# Patient Record
Sex: Male | Born: 1950 | Race: White | Hispanic: No | Marital: Single | Smoking: Never smoker
Health system: Southern US, Community
[De-identification: ages and names within clinical notes are randomized; demographics above are authoritative.]

## PROBLEM LIST (undated history)

## (undated) DIAGNOSIS — E119 Type 2 diabetes mellitus without complications: Secondary | ICD-10-CM

## (undated) DIAGNOSIS — I639 Cerebral infarction, unspecified: Secondary | ICD-10-CM

## (undated) DIAGNOSIS — E876 Hypokalemia: Secondary | ICD-10-CM

## (undated) DIAGNOSIS — K219 Gastro-esophageal reflux disease without esophagitis: Secondary | ICD-10-CM

## (undated) DIAGNOSIS — I4891 Unspecified atrial fibrillation: Secondary | ICD-10-CM

## (undated) DIAGNOSIS — H524 Presbyopia: Secondary | ICD-10-CM

## (undated) DIAGNOSIS — R972 Elevated prostate specific antigen [PSA]: Secondary | ICD-10-CM

## (undated) DIAGNOSIS — M8620 Subacute osteomyelitis, unspecified site: Secondary | ICD-10-CM

## (undated) DIAGNOSIS — I519 Heart disease, unspecified: Secondary | ICD-10-CM

## (undated) DIAGNOSIS — I10 Essential (primary) hypertension: Secondary | ICD-10-CM

## (undated) DIAGNOSIS — E785 Hyperlipidemia, unspecified: Secondary | ICD-10-CM

## (undated) DIAGNOSIS — K209 Esophagitis, unspecified without bleeding: Secondary | ICD-10-CM

## (undated) DIAGNOSIS — I219 Acute myocardial infarction, unspecified: Secondary | ICD-10-CM

## (undated) DIAGNOSIS — I739 Peripheral vascular disease, unspecified: Secondary | ICD-10-CM

## (undated) DIAGNOSIS — I2510 Atherosclerotic heart disease of native coronary artery without angina pectoris: Secondary | ICD-10-CM

## (undated) DIAGNOSIS — G3184 Mild cognitive impairment, so stated: Secondary | ICD-10-CM

## (undated) DIAGNOSIS — B351 Tinea unguium: Secondary | ICD-10-CM

## (undated) DIAGNOSIS — N182 Chronic kidney disease, stage 2 (mild): Secondary | ICD-10-CM

## (undated) DIAGNOSIS — E538 Deficiency of other specified B group vitamins: Secondary | ICD-10-CM

## (undated) DIAGNOSIS — R413 Other amnesia: Secondary | ICD-10-CM

## (undated) DIAGNOSIS — N189 Chronic kidney disease, unspecified: Secondary | ICD-10-CM

## (undated) DIAGNOSIS — M1990 Unspecified osteoarthritis, unspecified site: Secondary | ICD-10-CM

## (undated) DIAGNOSIS — T7840XA Allergy, unspecified, initial encounter: Secondary | ICD-10-CM

## (undated) DIAGNOSIS — G2581 Restless legs syndrome: Secondary | ICD-10-CM

## (undated) HISTORY — DX: Presbyopia: H52.4

## (undated) HISTORY — DX: Unspecified atrial fibrillation: I48.91

## (undated) HISTORY — PX: CORONARY ARTERY BYPASS GRAFT: SHX141

## (undated) HISTORY — DX: Esophagitis, unspecified without bleeding: K20.90

## (undated) HISTORY — DX: Essential (primary) hypertension: I10

## (undated) HISTORY — DX: Peripheral vascular disease, unspecified: I73.9

## (undated) HISTORY — DX: Tinea unguium: B35.1

## (undated) HISTORY — DX: Other amnesia: R41.3

## (undated) HISTORY — DX: Chronic kidney disease, stage 2 (mild): N18.2

## (undated) HISTORY — DX: Subacute osteomyelitis, unspecified site: M86.20

## (undated) HISTORY — DX: Restless legs syndrome: G25.81

## (undated) HISTORY — DX: Hypokalemia: E87.6

## (undated) HISTORY — PX: CARDIAC PACEMAKER PLACEMENT: SHX583

## (undated) HISTORY — DX: Allergy, unspecified, initial encounter: T78.40XA

## (undated) HISTORY — DX: Type 2 diabetes mellitus without complications: E11.9

## (undated) HISTORY — PX: FOOT AMPUTATION: SHX951

## (undated) HISTORY — DX: Hyperlipidemia, unspecified: E78.5

## (undated) HISTORY — DX: Deficiency of other specified B group vitamins: E53.8

## (undated) HISTORY — DX: Unspecified osteoarthritis, unspecified site: M19.90

## (undated) HISTORY — DX: Chronic kidney disease, unspecified: N18.9

## (undated) HISTORY — DX: Acute myocardial infarction, unspecified: I21.9

## (undated) HISTORY — DX: Cerebral infarction, unspecified: I63.9

## (undated) HISTORY — DX: Esophagitis, unspecified: K20.9

## (undated) HISTORY — DX: Elevated prostate specific antigen (PSA): R97.20

## (undated) HISTORY — DX: Heart disease, unspecified: I51.9

## (undated) HISTORY — DX: Atherosclerotic heart disease of native coronary artery without angina pectoris: I25.10

## (undated) HISTORY — DX: Mild cognitive impairment of uncertain or unknown etiology: G31.84

## (undated) HISTORY — DX: Gastro-esophageal reflux disease without esophagitis: K21.9

---

## 1998-02-09 ENCOUNTER — Other Ambulatory Visit: Admission: RE | Admit: 1998-02-09 | Discharge: 1998-02-09 | Payer: Self-pay

## 2000-02-20 ENCOUNTER — Encounter: Payer: Self-pay | Admitting: Interventional Cardiology

## 2000-02-20 ENCOUNTER — Observation Stay (HOSPITAL_COMMUNITY): Admission: EM | Admit: 2000-02-20 | Discharge: 2000-02-21 | Payer: Self-pay | Admitting: Emergency Medicine

## 2001-11-10 ENCOUNTER — Ambulatory Visit (HOSPITAL_BASED_OUTPATIENT_CLINIC_OR_DEPARTMENT_OTHER): Admission: RE | Admit: 2001-11-10 | Discharge: 2001-11-10 | Payer: Self-pay | Admitting: Orthopedic Surgery

## 2002-11-03 ENCOUNTER — Encounter: Payer: Self-pay | Admitting: Emergency Medicine

## 2002-11-04 ENCOUNTER — Inpatient Hospital Stay (HOSPITAL_COMMUNITY): Admission: EM | Admit: 2002-11-04 | Discharge: 2002-11-09 | Payer: Self-pay | Admitting: Psychiatry

## 2004-06-21 ENCOUNTER — Ambulatory Visit (HOSPITAL_COMMUNITY): Admission: RE | Admit: 2004-06-21 | Discharge: 2004-06-21 | Payer: Self-pay | Admitting: *Deleted

## 2004-06-27 ENCOUNTER — Encounter: Admission: RE | Admit: 2004-06-27 | Discharge: 2004-06-27 | Payer: Self-pay | Admitting: *Deleted

## 2010-06-24 ENCOUNTER — Encounter: Payer: Self-pay | Admitting: Gastroenterology

## 2018-02-24 ENCOUNTER — Other Ambulatory Visit: Payer: Self-pay

## 2018-02-24 ENCOUNTER — Encounter: Payer: Self-pay | Admitting: Nurse Practitioner

## 2018-02-24 ENCOUNTER — Ambulatory Visit (INDEPENDENT_AMBULATORY_CARE_PROVIDER_SITE_OTHER): Payer: Self-pay | Admitting: Nurse Practitioner

## 2018-02-24 VITALS — BP 110/66 | HR 79 | Temp 99.2°F | Ht 70.0 in | Wt 167.0 lb

## 2018-02-24 DIAGNOSIS — I5032 Chronic diastolic (congestive) heart failure: Secondary | ICD-10-CM

## 2018-02-24 DIAGNOSIS — E1165 Type 2 diabetes mellitus with hyperglycemia: Secondary | ICD-10-CM

## 2018-02-24 DIAGNOSIS — IMO0002 Reserved for concepts with insufficient information to code with codable children: Secondary | ICD-10-CM

## 2018-02-24 DIAGNOSIS — Z7901 Long term (current) use of anticoagulants: Secondary | ICD-10-CM

## 2018-02-24 DIAGNOSIS — S98311A Complete traumatic amputation of right midfoot, initial encounter: Secondary | ICD-10-CM

## 2018-02-24 DIAGNOSIS — Z7689 Persons encountering health services in other specified circumstances: Secondary | ICD-10-CM

## 2018-02-24 DIAGNOSIS — M86271 Subacute osteomyelitis, right ankle and foot: Secondary | ICD-10-CM

## 2018-02-24 DIAGNOSIS — I482 Chronic atrial fibrillation, unspecified: Secondary | ICD-10-CM

## 2018-02-24 DIAGNOSIS — E118 Type 2 diabetes mellitus with unspecified complications: Secondary | ICD-10-CM

## 2018-02-24 MED ORDER — CEPHALEXIN 500 MG PO CAPS: 500.0000 mg | ORAL_CAPSULE | Freq: Three times a day (TID) | ORAL | 0 refills | Status: AC

## 2018-02-24 MED ORDER — METFORMIN HCL 500 MG PO TABS: ORAL_TABLET | ORAL | 3 refills | Status: DC

## 2018-02-24 NOTE — Patient Instructions (Addendum)
Jason Montes,   Thank you for coming in to clinic today.  1. [facility] [address] [place], [place] [address] [phone]  2. Reduce Lantus to 15 units daily  3. STOP humalog  4. START metformin 500 mg tabs.   Week 1: Take 1 tab in am Week 2: Take 1 tab in am and pm Week 3: Take 2 tabs in am and 1 in pm Week 4: Take 2 tabs in am and 2 tabs in pm Slow this increase if having abdominal cramping or diarrhea.  5. Labs today  Please schedule a follow-up appointment with Cassell Smiles, AGNP. Return in about 6 weeks (around 04/07/2018) for diabetes.  If you have any other questions or concerns, please feel free to call the clinic or send a message through [employer]. You may also schedule an earlier appointment if necessary.  You will receive a survey after today's visit either digitally by e-mail or paper by [employer]. Your experiences and feedback matter to [place].  Please respond so we know how we are doing as we provide care for you.   Cassell Smiles, DNP, AGNP-BC Adult Gerontology Nurse Practitioner [place]

## 2018-02-24 NOTE — Progress Notes (Signed)
Subjective:    Patient ID: Jason Montes, male    DOB: 1951-03-03, 67 y.o.   MRN: [id number]  Jason Montes is a 67 y.o. male presenting on 02/24/2018 for Establish Care (medication refills diabetes, history strokes , heart attack )   HPI Establish Care New Provider Pt last seen by PCP several months ago when patient was incarcerated as his medical care was provided by the [facility]  Patient was released from prison approx 7 days ago. - Patient's daughter is Brendolyn Patty and her signifcbeant other, Jenna Luo.    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  Present medical care planned for patient to be continued as inpatient in hospital for IV antibiotic use.  This was never completed as there was not a bed available at [facility].  Patient has not been on any antibiotics since released from prison 7 days ago.  Chronic atrial fibrillation, chronic anticoagulation Patient is also on Warfarin with unknown last INR check.  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  He had a heart attack in 2017.  Otherwise, patient has no signs or symptoms of uncontrolled atrial fibrillation to include shortness of breath, heart palpitations, heart racing.  Uncontrolled type 2 diabetes - Complicated by peripheral vascular disease, amputation of partial right foot, hyperlipidemia DM T2, poor circulation - Progression over several months started with hangnail on toe, removed hangnail, amputated toe, all toes, then midfoot forward.  Has continued having poor healing and infections of remaining foot.  Working to get MRI at [facility].  No IV abx, discharged 02/11/2018.   No abx since discharge - wanted to have IV abx continued for osteomyelitis.  No known pathogen.  Closin little by little over last 6 months.  Now nearly closed, little drainage, some odor. -Diabetes has been managed by  taking Lantus 32 units in am and hemoglobin are 2 to 8 units 4 times daily.  Since being home, patient has been having overnight lows and is only using one daily dose of Humalog.  Patient and his daughter mention that they have been "chasing blood sugars" with significant lows followed by significant highs.  Ate 2 donuts this am.  Low was less than 50, and 1 hr after 2 donuts, CBG near 100. - Today CBG: avg about 100-109 with an extra freestyle CGM from a family member.  Previously averages have been similar. - Patient is eating much more consistently at home with better control over food choices (less refined carbs, more protein), is gaining weight which was needed.  Heart failure Patient with implanted AICD.  Possibly also a pacemaker but is unknown.  Patient currently denies any significant shortness of breath on exertion or at rest.  Requests cardiology referral for heart failure and A. Fib.  Past Medical History:  Diagnosis Date  . Allergy   . Atrial fibrillation ([place])   . B12 deficiency   . Chronic kidney disease   . Chronic kidney disease (CKD), stage II (mild)   . Coronary atherosclerosis   . Diabetes mellitus without complication ([place])   . Elevated PSA   . Esophagitis   . GERD (gastroesophageal reflux disease)   . Heart attack ([place])   . Heart disease   . Hyperlipidemia   . Hypertension   . Hypokalemia   . Mild neurocognitive disorder   . Osteoarthritis   . Presbyopia   . PVD (  peripheral vascular disease) ([place])   . Restless leg syndrome   . Stroke (cerebrum) ([place])   . Stroke ([place])   . Subacute osteomyelitis ([place])   . Tinea unguium   . Uncompensated short term memory deficit    Past Surgical History:  Procedure Laterality Date  . CARDIAC PACEMAKER PLACEMENT    . CORONARY ARTERY BYPASS GRAFT    . FOOT AMPUTATION Right    Social History   Socioeconomic History  . Marital status: Single    Spouse name: Not on file  . Number of children: Not on file  . Years of  education: Not on file  . Highest education level: Not on file  Occupational History  . Not on file  Social Needs  . Financial resource strain: Not on file  . Food insecurity:    Worry: Not on file    Inability: Not on file  . Transportation needs:    Medical: Not on file    Non-medical: Not on file  Tobacco Use  . Smoking status: Never Smoker  . Smokeless tobacco: Never Used  Substance and Sexual Activity  . Alcohol use: Never    Frequency: Never  . Drug use: Never  . Sexual activity: Not on file  Lifestyle  . Physical activity:    Days per week: Not on file    Minutes per session: Not on file  . Stress: Not on file  Relationships  . Social connections:    Talks on phone: Not on file    Gets together: Not on file    Attends religious service: Not on file    Active member of club or organization: Not on file    Attends meetings of clubs or organizations: Not on file    Relationship status: Not on file  . Intimate partner violence:    Fear of current or ex partner: Not on file    Emotionally abused: Not on file    Physically abused: Not on file    Forced sexual activity: Not on file  Other Topics Concern  . Not on file  Social History Narrative  . Not on file   Family History  Problem Relation Age of Onset  . Heart failure Mother   . Colon cancer Mother   . Diabetes Mother   . Thyroid disease Sister    Current Outpatient Medications on File Prior to Visit  Medication Sig  . atorvastatin (LIPITOR) 40 MG tablet Take 40 mg by mouth daily.  . bethanechol (URECHOLINE) 10 MG tablet Take 20 mg by mouth 2 (two) times daily.   . carvedilol (COREG) 3.125 MG tablet Take 3.125 mg by mouth 2 (two) times daily with a meal.  . cetirizine (ZYRTEC) 10 MG tablet Take 10 mg by mouth daily.  . Cholecalciferol (VITAMIN D3) 1000 units CAPS Take 1,000 Units by mouth daily.   . clotrimazole-betamethasone (LOTRISONE) cream Apply 1 application topically 2 (two) times daily.  .  cyanocobalamin (,VITAMIN B-12,) 1000 MCG/ML injection Inject 1,000 mcg into the muscle every 30 (thirty) days.  . Dextrose, Diabetic Use, (DEXTROSE PO) Take 8 mg by mouth as needed (hypoglycemia).  Marland Kitchen docusate sodium (COLACE) 100 MG capsule Take 100 mg by mouth 2 (two) times daily.  . famotidine (PEPCID) 20 MG tablet Take 20 mg by mouth 2 (two) times daily.  . fluticasone (FLONASE) 50 MCG/ACT nasal spray Place 1 spray into both nostrils daily.   Marland Kitchen gabapentin (NEURONTIN) 600 MG tablet Take 600 mg by mouth 3 (  three) times daily.   . isosorbide mononitrate (IMDUR) 30 MG 24 hr tablet Take 30 mg by mouth daily.  . magnesium oxide (MAG-OX) 400 MG tablet Take 400 mg by mouth daily.  . nitroGLYCERIN (NITROSTAT) 0.4 MG SL tablet Place 0.4 mg under the tongue every 5 (five) minutes as needed for chest pain.  Marland Kitchen senna (SENOKOT) 8.6 MG tablet Take 1 tablet by mouth daily.  Marland Kitchen spironolactone (ALDACTONE) 25 MG tablet Take 25 mg by mouth daily.  . tamsulosin (FLOMAX) 0.4 MG CAPS capsule Take 0.4 mg by mouth daily.   Marland Kitchen torsemide (DEMADEX) 20 MG tablet Take 20 mg by mouth 2 (two) times daily.   No current facility-administered medications on file prior to visit.     Review of Systems  Constitutional: Positive for fatigue. Negative for activity change, appetite change and unexpected weight change.  HENT: Negative for congestion, hearing loss and trouble swallowing.   Eyes: Negative for visual disturbance.  Respiratory: Negative for choking, shortness of breath and wheezing.   Cardiovascular: Negative for chest pain and palpitations.  Gastrointestinal: Negative for abdominal pain, blood in stool, constipation and diarrhea.  Genitourinary: Negative for difficulty urinating, discharge, flank pain, genital sores, penile pain, penile swelling, scrotal swelling and testicular pain.  Musculoskeletal: Positive for back pain and gait problem (2/2 partial foot amputation). Negative for arthralgias and myalgias.  Skin:  Positive for wound. Negative for color change and rash.  Allergic/Immunologic: Negative for environmental allergies.  Neurological: Positive for weakness. Negative for dizziness, seizures and headaches.  Psychiatric/Behavioral: Positive for confusion (memory impairment), decreased concentration and dysphoric mood. Negative for behavioral problems, sleep disturbance and suicidal ideas. The patient is nervous/anxious.    Per HPI unless specifically indicated above     Objective:    BP 110/66 (BP Location: Right Arm, Patient Position: Sitting, Cuff Size: Normal)   Pulse 79   Temp 99.2 F (37.3 C) (Oral)   Ht 5\' 10"  (1.778 m)   Wt 167 lb (75.8 kg)   BMI 23.96 kg/m   Wt Readings from Last 3 Encounters:  02/24/18 167 lb (75.8 kg)    Physical Exam  Constitutional: He is oriented to person, place, and time. He appears well-developed and well-nourished. No distress.  HENT:  Head: Normocephalic and atraumatic.  Cardiovascular: Normal rate, S1 normal, S2 normal, normal heart sounds and intact distal pulses.  Pulmonary/Chest: Effort normal and breath sounds normal. No respiratory distress.  Neurological: He is alert and oriented to person, place, and time.  Skin: Skin is warm and dry.  Psychiatric: He has a normal mood and affect. His behavior is normal.  Vitals reviewed.      Assessment & Plan:   Problem List Items Addressed This Visit      Cardiovascular and Mediastinum   Atrial fibrillation ([place]) Status unknown for monitoring anticoag.  Recheck labs.  Continue meds without changes today.  Refills provided. Followup after labs.    Relevant Medications   atorvastatin (LIPITOR) 40 MG tablet   isosorbide mononitrate (IMDUR) 30 MG 24 hr tablet   spironolactone (ALDACTONE) 25 MG tablet   carvedilol (COREG) 3.125 MG tablet   torsemide (DEMADEX) 20 MG tablet   nitroGLYCERIN (NITROSTAT) 0.4 MG SL tablet   warfarin (COUMADIN) 1 MG tablet   warfarin (COUMADIN) 7.5 MG tablet   Other  Relevant Orders   Ambulatory referral to Cardiology   Chronic diastolic heart failure ([place]) Stable today on exam.  Medications tolerated without side effects.  Continue at current doses.  Refills provided.  Check labs today. Followup 6 weeks.    Relevant Medications   atorvastatin (LIPITOR) 40 MG tablet   isosorbide mononitrate (IMDUR) 30 MG 24 hr tablet   spironolactone (ALDACTONE) 25 MG tablet   carvedilol (COREG) 3.125 MG tablet   torsemide (DEMADEX) 20 MG tablet   nitroGLYCERIN (NITROSTAT) 0.4 MG SL tablet   warfarin (COUMADIN) 1 MG tablet   warfarin (COUMADIN) 7.5 MG tablet   Other Relevant Orders   Ambulatory referral to Cardiology   Magnesium     Endocrine   DM (diabetes mellitus), type 2, uncontrolled with complications ([place]) UncontrolledDM with goal A1c < 8.0%. - Complications - foot ulcer w amputation, peripheral neuropathy and hypoglycemia.  Plan:  1. Change therapy:  - Start metformin with gradual dose increase to 1,000 mg bid See Avs - Reduce lantus 15 units daily - Stop humalog 2. Encourage improved lifestyle: - low carb/low glycemic diet handout provided - Increase physical activity to 30 minutes most days of the week.  Explained that increased physical activity increases body's use of sugar for energy. 3. Check fasting am CBG and bring log to next visit for review 4. Continue ASA, ACEi and Statin 5. Advised to schedule DM ophtho exam, send record. 6. Follow-up 6 weeks for DM with sugar log   Relevant Medications   atorvastatin (LIPITOR) 40 MG tablet   metFORMIN (GLUCOPHAGE) 500 MG tablet   insulin glargine (LANTUS) 100 UNIT/ML injection   Dextrose, Diabetic Use, (DEXTROSE PO)   Other Relevant Orders   Hemoglobin A1c   COMPLETE METABOLIC PANEL WITH GFR     Musculoskeletal and Integument   Subacute osteomyelitis of right foot ([place]) Continue treatment.  Consider referral to wound care if worsening. Family are EMTs and will call if worsening skin infection.    Relevant Medications   clotrimazole-betamethasone (LOTRISONE) cream     Other   Chronic anticoagulation - Primary   Relevant Medications   warfarin (COUMADIN) 1 MG tablet   warfarin (COUMADIN) 7.5 MG tablet   Other Relevant Orders   INR/PT   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 [facility])   Relevant Orders   AMB referral to wound care center    Other Visit Diagnoses    Encounter to establish care     Previous PCP [facility] at [facility].  Records will be requested.  Past medical, family, and surgical history reviewed w/ pt.        Meds ordered this encounter  Medications  . metFORMIN (GLUCOPHAGE) 500 MG tablet    Sig: Start 1 tab by mouth in am daily for 7 days. Then increase to 1 tab in am & pm for 7 days.  Continue weekly increase to 2 tabs twice daily.    Dispense:  120 tablet    Refill:  3    Order Specific Question:   Supervising Provider    Answer:   Olin Hauser [[id number]]  . cephALEXin (KEFLEX) 500 MG capsule    Sig: Take 1 capsule (500 mg total) by mouth 3 (three) times daily for 14 days.    Dispense:  42 capsule    Refill:  0    Order Specific Question:   Supervising Provider    Answer:   Olin Hauser [[id number]]  . insulin glargine (LANTUS) 100 UNIT/ML injection    Sig: Inject 0.15 mLs (15 Units total) into the skin daily.    Dispense:  10 mL    Refill:  1    Fill only when refill  is requested by patient.    Order Specific Question:   Supervising Provider    Answer:   Olin Hauser [[id number]]  . warfarin (COUMADIN) 1 MG tablet    Sig: Take 1 tablet (1 mg total) by mouth at bedtime.    Dispense:  30 tablet    Refill:  1    Order Specific Question:   Supervising Provider    Answer:   Olin Hauser [[id number]]  . warfarin (COUMADIN) 7.5 MG tablet    Sig: Take 1 tablet (7.5 mg total) by mouth at bedtime.    Dispense:  30 tablet    Refill:  1    Order Specific Question:   Supervising Provider    Answer:   Olin Hauser [[id number]]     Follow up  plan: Return in about 6 weeks (around 04/07/2018) for diabetes.  Cassell Smiles, DNP, AGPCNP-BC Adult Gerontology Primary Care Nurse Practitioner [facility] 02/24/2018, 2:42 PM

## 2018-02-25 DIAGNOSIS — D519 Vitamin B12 deficiency anemia, unspecified: Secondary | ICD-10-CM | POA: Insufficient documentation

## 2018-02-25 DIAGNOSIS — M86271 Subacute osteomyelitis, right ankle and foot: Secondary | ICD-10-CM | POA: Insufficient documentation

## 2018-02-25 DIAGNOSIS — S98319A Complete traumatic amputation of unspecified midfoot, initial encounter: Secondary | ICD-10-CM | POA: Insufficient documentation

## 2018-02-25 DIAGNOSIS — I739 Peripheral vascular disease, unspecified: Secondary | ICD-10-CM | POA: Insufficient documentation

## 2018-02-25 DIAGNOSIS — E1165 Type 2 diabetes mellitus with hyperglycemia: Secondary | ICD-10-CM | POA: Insufficient documentation

## 2018-02-25 DIAGNOSIS — E785 Hyperlipidemia, unspecified: Secondary | ICD-10-CM | POA: Insufficient documentation

## 2018-02-25 DIAGNOSIS — N182 Chronic kidney disease, stage 2 (mild): Secondary | ICD-10-CM | POA: Insufficient documentation

## 2018-02-25 DIAGNOSIS — M6281 Muscle weakness (generalized): Secondary | ICD-10-CM | POA: Insufficient documentation

## 2018-02-25 DIAGNOSIS — Z7901 Long term (current) use of anticoagulants: Secondary | ICD-10-CM | POA: Insufficient documentation

## 2018-02-25 DIAGNOSIS — B351 Tinea unguium: Secondary | ICD-10-CM | POA: Insufficient documentation

## 2018-02-25 DIAGNOSIS — K2100 Gastro-esophageal reflux disease with esophagitis, without bleeding: Secondary | ICD-10-CM | POA: Insufficient documentation

## 2018-02-25 DIAGNOSIS — G2581 Restless legs syndrome: Secondary | ICD-10-CM | POA: Insufficient documentation

## 2018-02-25 DIAGNOSIS — I10 Essential (primary) hypertension: Secondary | ICD-10-CM | POA: Insufficient documentation

## 2018-02-25 DIAGNOSIS — I5032 Chronic diastolic (congestive) heart failure: Secondary | ICD-10-CM | POA: Insufficient documentation

## 2018-02-25 DIAGNOSIS — G3184 Mild cognitive impairment, so stated: Secondary | ICD-10-CM | POA: Insufficient documentation

## 2018-02-25 DIAGNOSIS — H524 Presbyopia: Secondary | ICD-10-CM | POA: Insufficient documentation

## 2018-02-25 DIAGNOSIS — I5022 Chronic systolic (congestive) heart failure: Secondary | ICD-10-CM | POA: Insufficient documentation

## 2018-02-25 DIAGNOSIS — M1990 Unspecified osteoarthritis, unspecified site: Secondary | ICD-10-CM | POA: Insufficient documentation

## 2018-02-25 DIAGNOSIS — E876 Hypokalemia: Secondary | ICD-10-CM | POA: Insufficient documentation

## 2018-02-25 DIAGNOSIS — I6930 Unspecified sequelae of cerebral infarction: Secondary | ICD-10-CM | POA: Insufficient documentation

## 2018-02-25 DIAGNOSIS — I4891 Unspecified atrial fibrillation: Secondary | ICD-10-CM | POA: Insufficient documentation

## 2018-02-25 DIAGNOSIS — I2510 Atherosclerotic heart disease of native coronary artery without angina pectoris: Secondary | ICD-10-CM | POA: Insufficient documentation

## 2018-02-25 DIAGNOSIS — IMO0002 Reserved for concepts with insufficient information to code with codable children: Secondary | ICD-10-CM | POA: Insufficient documentation

## 2018-02-25 DIAGNOSIS — E118 Type 2 diabetes mellitus with unspecified complications: Secondary | ICD-10-CM

## 2018-02-25 MED ORDER — INSULIN GLARGINE 100 UNIT/ML ~~LOC~~ SOLN: 15.0000 [IU] | Freq: Every day | SUBCUTANEOUS | 1 refills | Status: DC

## 2018-02-25 MED ORDER — WARFARIN SODIUM 7.5 MG PO TABS: 7.5000 mg | ORAL_TABLET | Freq: Every day | ORAL | 1 refills | Status: DC

## 2018-02-25 MED ORDER — WARFARIN SODIUM 1 MG PO TABS: 1.0000 mg | ORAL_TABLET | Freq: Every day | ORAL | 1 refills | Status: DC

## 2018-03-22 ENCOUNTER — Ambulatory Visit: Payer: Self-pay | Admitting: Physician Assistant

## 2018-04-01 ENCOUNTER — Other Ambulatory Visit: Payer: Self-pay

## 2018-04-01 DIAGNOSIS — Z1211 Encounter for screening for malignant neoplasm of colon: Secondary | ICD-10-CM

## 2018-04-06 ENCOUNTER — Ambulatory Visit: Payer: Self-pay | Admitting: Nurse Practitioner

## 2018-04-07 ENCOUNTER — Ambulatory Visit: Payer: Self-pay | Admitting: Internal Medicine

## 2018-04-08 ENCOUNTER — Ambulatory Visit: Payer: Self-pay | Admitting: Nurse Practitioner

## 2018-04-23 ENCOUNTER — Encounter: Payer: Self-pay | Admitting: Nurse Practitioner

## 2018-04-26 ENCOUNTER — Encounter: Payer: Self-pay | Admitting: *Deleted

## 2018-04-26 ENCOUNTER — Other Ambulatory Visit: Payer: Self-pay

## 2018-04-26 ENCOUNTER — Telehealth: Payer: Self-pay

## 2018-04-26 NOTE — Telephone Encounter (Signed)
Patients daughter contacted office states her father did not receive colonoscopy instructions for tomorrow.  She said he has since moved from the address on file.  Procedure has been rescheduled from tomorrow to December 17th Dr. Allen Norris at [facility].  Referral updated.  Address updated.  Trish notified.  New instructions being mailed.  No Cancellation Fee  Thanks Sharyn Lull

## 2018-05-14 ENCOUNTER — Other Ambulatory Visit: Payer: Self-pay

## 2018-05-17 ENCOUNTER — Encounter: Payer: Self-pay | Admitting: *Deleted

## 2018-05-18 ENCOUNTER — Ambulatory Visit
Admission: RE | Admit: 2018-05-18 | Discharge: 2018-05-18 | Disposition: A | Discharge status: Home / Self Care | Payer: No Typology Code available for payment source | Source: Ambulatory Visit | Attending: Gastroenterology | Admitting: Gastroenterology

## 2018-05-18 ENCOUNTER — Ambulatory Visit: Payer: No Typology Code available for payment source | Admitting: Certified Registered"

## 2018-05-18 ENCOUNTER — Encounter
Admission: RE | Disposition: A | Discharge status: Home / Self Care | Payer: Self-pay | Source: Ambulatory Visit | Attending: Gastroenterology

## 2018-05-18 DIAGNOSIS — Z950 Presence of cardiac pacemaker: Secondary | ICD-10-CM | POA: Insufficient documentation

## 2018-05-18 DIAGNOSIS — D122 Benign neoplasm of ascending colon: Secondary | ICD-10-CM

## 2018-05-18 DIAGNOSIS — Z951 Presence of aortocoronary bypass graft: Secondary | ICD-10-CM | POA: Diagnosis not present

## 2018-05-18 DIAGNOSIS — I129 Hypertensive chronic kidney disease with stage 1 through stage 4 chronic kidney disease, or unspecified chronic kidney disease: Secondary | ICD-10-CM | POA: Insufficient documentation

## 2018-05-18 DIAGNOSIS — K635 Polyp of colon: Secondary | ICD-10-CM | POA: Diagnosis not present

## 2018-05-18 DIAGNOSIS — D125 Benign neoplasm of sigmoid colon: Secondary | ICD-10-CM

## 2018-05-18 DIAGNOSIS — Z79899 Other long term (current) drug therapy: Secondary | ICD-10-CM | POA: Insufficient documentation

## 2018-05-18 DIAGNOSIS — E1151 Type 2 diabetes mellitus with diabetic peripheral angiopathy without gangrene: Secondary | ICD-10-CM | POA: Diagnosis not present

## 2018-05-18 DIAGNOSIS — Z880 Allergy status to penicillin: Secondary | ICD-10-CM | POA: Diagnosis not present

## 2018-05-18 DIAGNOSIS — Z794 Long term (current) use of insulin: Secondary | ICD-10-CM | POA: Insufficient documentation

## 2018-05-18 DIAGNOSIS — D123 Benign neoplasm of transverse colon: Secondary | ICD-10-CM | POA: Diagnosis not present

## 2018-05-18 DIAGNOSIS — D120 Benign neoplasm of cecum: Secondary | ICD-10-CM | POA: Diagnosis not present

## 2018-05-18 DIAGNOSIS — K219 Gastro-esophageal reflux disease without esophagitis: Secondary | ICD-10-CM | POA: Insufficient documentation

## 2018-05-18 DIAGNOSIS — I2510 Atherosclerotic heart disease of native coronary artery without angina pectoris: Secondary | ICD-10-CM | POA: Diagnosis not present

## 2018-05-18 DIAGNOSIS — Z89431 Acquired absence of right foot: Secondary | ICD-10-CM | POA: Diagnosis not present

## 2018-05-18 DIAGNOSIS — Z800 Family history of malignant neoplasm of digestive organs: Secondary | ICD-10-CM | POA: Insufficient documentation

## 2018-05-18 DIAGNOSIS — K6389 Other specified diseases of intestine: Secondary | ICD-10-CM | POA: Insufficient documentation

## 2018-05-18 DIAGNOSIS — Z1211 Encounter for screening for malignant neoplasm of colon: Secondary | ICD-10-CM | POA: Diagnosis present

## 2018-05-18 DIAGNOSIS — N182 Chronic kidney disease, stage 2 (mild): Secondary | ICD-10-CM | POA: Diagnosis not present

## 2018-05-18 DIAGNOSIS — E785 Hyperlipidemia, unspecified: Secondary | ICD-10-CM | POA: Diagnosis not present

## 2018-05-18 DIAGNOSIS — I252 Old myocardial infarction: Secondary | ICD-10-CM | POA: Diagnosis not present

## 2018-05-18 DIAGNOSIS — D124 Benign neoplasm of descending colon: Secondary | ICD-10-CM | POA: Diagnosis not present

## 2018-05-18 DIAGNOSIS — Z888 Allergy status to other drugs, medicaments and biological substances status: Secondary | ICD-10-CM | POA: Diagnosis not present

## 2018-05-18 DIAGNOSIS — E1122 Type 2 diabetes mellitus with diabetic chronic kidney disease: Secondary | ICD-10-CM | POA: Insufficient documentation

## 2018-05-18 DIAGNOSIS — Z8673 Personal history of transient ischemic attack (TIA), and cerebral infarction without residual deficits: Secondary | ICD-10-CM | POA: Insufficient documentation

## 2018-05-18 HISTORY — PX: COLONOSCOPY WITH PROPOFOL: SHX5780

## 2018-05-18 LAB — GLUCOSE, CAPILLARY: Glucose-Capillary: 143 mg/dL — ABNORMAL HIGH (ref 70–99)

## 2018-05-18 SURGERY — COLONOSCOPY WITH PROPOFOL
Anesthesia: General

## 2018-05-18 MED ORDER — PHENYLEPHRINE HCL 10 MG/ML IJ SOLN
INTRAMUSCULAR | Status: AC
  Filled 2018-05-18: qty 1

## 2018-05-18 MED ORDER — LIDOCAINE HCL (CARDIAC) PF 100 MG/5ML IV SOSY
PREFILLED_SYRINGE | INTRAVENOUS | Status: DC | PRN
  Administered 2018-05-18: 50 mg via INTRAVENOUS

## 2018-05-18 MED ORDER — PROPOFOL 10 MG/ML IV BOLUS
INTRAVENOUS | Status: DC | PRN
  Administered 2018-05-18: 70 mg via INTRAVENOUS

## 2018-05-18 MED ORDER — LIDOCAINE HCL (PF) 2 % IJ SOLN
INTRAMUSCULAR | Status: AC
  Filled 2018-05-18: qty 10

## 2018-05-18 MED ORDER — PROPOFOL 500 MG/50ML IV EMUL
INTRAVENOUS | Status: AC
  Filled 2018-05-18: qty 50

## 2018-05-18 MED ORDER — SODIUM CHLORIDE 0.9 % IV SOLN
INTRAVENOUS | Status: DC
  Administered 2018-05-18: 1000 mL via INTRAVENOUS

## 2018-05-18 MED ORDER — PROPOFOL 500 MG/50ML IV EMUL
INTRAVENOUS | Status: DC | PRN
  Administered 2018-05-18: 120 ug/kg/min via INTRAVENOUS

## 2018-05-18 NOTE — Anesthesia Post-op Follow-up Note (Signed)
Anesthesia QCDR form completed.        

## 2018-05-18 NOTE — Anesthesia Procedure Notes (Signed)
Performed by: Caulin Begley, CRNA Pre-anesthesia Checklist: Patient identified, Emergency Drugs available, Suction available, Patient being monitored and Timeout performed Patient Re-evaluated:Patient Re-evaluated prior to induction Oxygen Delivery Method: Nasal cannula Induction Type: IV induction       

## 2018-05-18 NOTE — Anesthesia Preprocedure Evaluation (Signed)
Anesthesia Evaluation  Patient identified by MRN, date of birth, ID band Patient awake    Reviewed: Allergy & Precautions, H&P , NPO status , Patient's Chart, lab work & pertinent test results, reviewed documented beta blocker date and time   Airway Mallampati: II   Neck ROM: full    Dental  (+) Poor Dentition   Pulmonary neg pulmonary ROS,    Pulmonary exam normal        Cardiovascular Exercise Tolerance: Poor hypertension, On Medications + CAD, + Past MI and + Peripheral Vascular Disease  Normal cardiovascular exam Rhythm:regular Rate:Normal     Neuro/Psych CVA, No Residual Symptoms negative psych ROS   GI/Hepatic Neg liver ROS, GERD  Medicated,  Endo/Other  negative endocrine ROSdiabetes  Renal/GU Renal disease  negative genitourinary   Musculoskeletal   Abdominal   Peds  Hematology  (+) Blood dyscrasia, anemia ,   Anesthesia Other Findings Past Medical History: No date: Allergy No date: Atrial fibrillation (H[facility]) No date: B12 deficiency No date: Chronic kidney disease No date: Chronic kidney disease (CKD), stage II (mild) No date: Coronary atherosclerosis No date: Diabetes mellitus without complication ([facility]) No date: Elevated PSA No date: Esophagitis No date: GERD (gastroesophageal reflux disease) No date: Heart attack ([place]) No date: Heart disease No date: Hyperlipidemia No date: Hypertension No date: Hypokalemia No date: Mild neurocognitive disorder No date: Osteoarthritis No date: Presbyopia No date: PVD (peripheral vascular disease) ([place]) No date: Restless leg syndrome No date: Stroke (cerebrum) ([facility]) No date: Stroke ([place]) No date: Subacute osteomyelitis ([place]) No date: Tinea unguium No date: Uncompensated short term memory deficit Past Surgical History: No date: CARDIAC PACEMAKER PLACEMENT No date: CORONARY ARTERY BYPASS GRAFT No date: FOOT AMPUTATION; Right BMI    Body Mass Index:  25.99  kg/m     Reproductive/Obstetrics negative OB ROS                             Anesthesia Physical Anesthesia Plan  ASA: IV  Anesthesia Plan: General   Post-op Pain Management:    Induction:   PONV Risk Score and Plan:   Airway Management Planned:   Additional Equipment:   Intra-op Plan:   Post-operative Plan:   Informed Consent: I have reviewed the patients History and Physical, chart, labs and discussed the procedure including the risks, benefits and alternatives for the proposed anesthesia with the patient or authorized representative who has indicated his/her understanding and acceptance.   Dental Advisory Given  Plan Discussed with: CRNA  Anesthesia Plan Comments:         Anesthesia Quick Evaluation

## 2018-05-18 NOTE — H&P (Signed)
[name], MD [facility] [address] [place], [place] [address] [phone] Fax : [phone]  Primary Care Physician:  Administration, [facility] Primary Gastroenterologist:  Dr. Allen Norris  Pre-Procedure History & Physical: HPI:  Jason Montes is a 67 y.o. male is here for an colonoscopy.   Past Medical History:  Diagnosis Date  . Allergy   . Atrial fibrillation ([place])   . B12 deficiency   . Chronic kidney disease   . Chronic kidney disease (CKD), stage II (mild)   . Coronary atherosclerosis   . Diabetes mellitus without complication ([place])   . Elevated PSA   . Esophagitis   . GERD (gastroesophageal reflux disease)   . Heart attack ([place])   . Heart disease   . Hyperlipidemia   . Hypertension   . Hypokalemia   . Mild neurocognitive disorder   . Osteoarthritis   . Presbyopia   . PVD (peripheral vascular disease) ([place])   . Restless leg syndrome   . Stroke (cerebrum) ([place])   . Stroke ([place])   . Subacute osteomyelitis ([place])   . Tinea unguium   . Uncompensated short term memory deficit     Past Surgical History:  Procedure Laterality Date  . CARDIAC PACEMAKER PLACEMENT    . CORONARY ARTERY BYPASS GRAFT    . FOOT AMPUTATION Right     Prior to Admission medications   Medication Sig Start Date End Date Taking? Authorizing Provider  atorvastatin (LIPITOR) 40 MG tablet Take 40 mg by mouth daily.   Yes [provider]  carvedilol (COREG) 3.125 MG tablet Take 3.125 mg by mouth 2 (two) times daily with a meal.   Yes [provider]  cetirizine (ZYRTEC) 10 MG tablet Take 10 mg by mouth daily.   Yes [provider]  Cholecalciferol (VITAMIN D3) 1000 units CAPS Take 1,000 Units by mouth daily.    Yes [provider]  clotrimazole-betamethasone (LOTRISONE) cream Apply 1 application topically 2 (two) times daily.   Yes [provider]  cyanocobalamin (,VITAMIN B-12,) 1000 MCG/ML injection Inject 1,000 mcg into the muscle  every 30 (thirty) days.   Yes [provider]  Dextrose, Diabetic Use, (DEXTROSE PO) Take 8 mg by mouth as needed (hypoglycemia).   Yes [provider]  docusate sodium (COLACE) 100 MG capsule Take 100 mg by mouth 2 (two) times daily.   Yes [provider]  famotidine (PEPCID) 20 MG tablet Take 20 mg by mouth 2 (two) times daily.   Yes [provider]  fluticasone (FLONASE) 50 MCG/ACT nasal spray Place 1 spray into both nostrils daily.    Yes [provider]  gabapentin (NEURONTIN) 600 MG tablet Take 600 mg by mouth 3 (three) times daily.    Yes [provider]  insulin glargine (LANTUS) 100 UNIT/ML injection Inject 0.15 mLs (15 Units total) into the skin daily. 02/25/18  Yes Mikey College, NP  isosorbide mononitrate (IMDUR) 30 MG 24 hr tablet Take 30 mg by mouth daily.   Yes [provider]  magnesium oxide (MAG-OX) 400 MG tablet Take 400 mg by mouth daily.   Yes [provider]  metFORMIN (GLUCOPHAGE) 500 MG tablet Start 1 tab by mouth in am daily for 7 days. Then increase to 1 tab in am & pm for 7 days.  Continue weekly increase to 2 tabs twice daily. 02/24/18  Yes Mikey [employer], NP  nitroGLYCERIN (NITROSTAT) 0.4 MG SL tablet Place 0.4 mg under the tongue every 5 (five)  minutes as needed for chest pain.   Yes [provider]  senna (SENOKOT) 8.6 MG tablet Take 1 tablet by mouth daily.   Yes [provider]  spironolactone (ALDACTONE) 25 MG tablet Take 25 mg by mouth daily.   Yes [provider]  tamsulosin (FLOMAX) 0.4 MG CAPS capsule Take 0.4 mg by mouth daily.    Yes [provider]  torsemide (DEMADEX) 20 MG tablet Take 20 mg by mouth 2 (two) times daily.   Yes [provider]  bethanechol (URECHOLINE) 10 MG tablet Take 20 mg by mouth 2 (two) times daily.     [provider]  warfarin (COUMADIN) 1 MG tablet Take 1 tablet (1 mg total) by mouth at  bedtime. Patient not taking: Reported on 05/18/2018 02/25/18   Mikey College, NP  warfarin (COUMADIN) 7.5 MG tablet Take 1 tablet (7.5 mg total) by mouth at bedtime. Patient not taking: Reported on 05/18/2018 02/25/18   Mikey College, NP    Allergies as of 04/01/2018 - Review Complete 02/24/2018  Allergen Reaction Noted  . Norvasc [amlodipine besylate]  02/24/2018  . Penicillins  02/24/2018    Family History  Problem Relation Age of Onset  . Heart failure Mother   . Colon cancer Mother   . Diabetes Mother   . Thyroid disease Sister     Social History   Socioeconomic History  . Marital status: Single    Spouse name: Not on file  . Number of children: Not on file  . Years of education: Not on file  . Highest education level: Not on file  Occupational History  . Not on file  Social Needs  . Financial resource strain: Not on file  . Food insecurity:    Worry: Not on file    Inability: Not on file  . Transportation needs:    Medical: Not on file    Non-medical: Not on file  Tobacco Use  . Smoking status: Never Smoker  . Smokeless tobacco: Never Used  Substance and Sexual Activity  . Alcohol use: Never    Frequency: Never  . Drug use: Never  . Sexual activity: Not on file  Lifestyle  . Physical activity:    Days per week: Not on file    Minutes per session: Not on file  . Stress: Not on file  Relationships  . Social connections:    Talks on phone: Not on file    Gets together: Not on file    Attends religious service: Not on file    Active member of club or organization: Not on file    Attends meetings of clubs or organizations: Not on file    Relationship status: Not on file  . Intimate partner violence:    Fear of current or ex partner: Not on file    Emotionally abused: Not on file    Physically abused: Not on file    Forced sexual activity: Not on file  Other Topics Concern  . Not on file  Social History Narrative  . Not on file     Review of Systems: See HPI, otherwise negative ROS  Physical Exam: BP 105/88   Pulse 80   Temp (!) 96.9 F (36.1 C) (Tympanic)   Resp 20   Ht 5\' 9"  (1.753 m)   Wt 79.8 kg   SpO2 100%   BMI 25.99 kg/m  General:   Alert,  pleasant and cooperative in NAD Head:  Normocephalic and atraumatic. Neck:  Supple; no masses or thyromegaly. Lungs:  Clear throughout to auscultation.    Heart:  Regular rate and rhythm. Abdomen:  Soft, nontender and nondistended. Normal bowel sounds, without guarding, and without rebound.   Neurologic:  Alert and  oriented x4;  grossly normal neurologically.  Impression/Plan: Jason Montes is here for an colonoscopy to be performed for family history of colon cancer.  Risks, benefits, limitations, and alternatives regarding  colonoscopy have been reviewed with the patient.  Questions have been answered.  All parties agreeable.   [name], MD  05/18/2018, 7:50 AM

## 2018-05-18 NOTE — Op Note (Signed)
[facility] Gastroenterology Patient Name: Jason Montes Procedure Date: 05/18/2018 7:48 AM MRN: [id number] Account #: [id number] Date of Birth: [birth year] Admit Type: Outpatient Age: 67 Room: [facility] ENDO ROOM 4 Gender: Male Note Status: Finalized Procedure:            Colonoscopy Indications:          Family history of anal canal cancer in a first-degree                        relative Providers:            [name] MD, MD Referring MD:         No Local Md, MD (Referring MD) Medicines:            Propofol per Anesthesia Complications:        No immediate complications. Procedure:            Pre-Anesthesia Assessment:                       - Prior to the procedure, a History and Physical was                        performed, and patient medications and allergies were                        reviewed. The patient's tolerance of previous                        anesthesia was also reviewed. The risks and benefits of                        the procedure and the sedation options and risks were                        discussed with the patient. All questions were                        answered, and informed consent was obtained. Prior                        Anticoagulants: The patient has taken no previous                        anticoagulant or antiplatelet agents. ASA Grade                        Assessment: II - A patient with mild systemic disease.                        After reviewing the risks and benefits, the patient was                        deemed in satisfactory condition to undergo the                        procedure.                       After obtaining informed consent, the colonoscope was  passed under direct vision. Throughout the procedure,                        the patient's blood pressure, pulse, and oxygen                        saturations were monitored continuously. The                        Colonoscope was  introduced through the anus and                        advanced to the the cecum, identified by appendiceal                        orifice and ileocecal valve. The colonoscopy was                        performed without difficulty. The patient tolerated the                        procedure well. The quality of the bowel preparation                        was poor. Findings:      The perianal and digital rectal examinations were normal.      A 6 mm polyp was found in the sigmoid colon. The polyp was sessile. The       polyp was removed with a cold snare. Resection and retrieval were       complete.      A 6 mm polyp was found in the descending colon. The polyp was sessile.       The polyp was removed with a cold snare. Resection and retrieval were       complete.      A 3 mm polyp was found in the transverse colon. The polyp was sessile.       The polyp was removed with a cold biopsy forceps. Resection and       retrieval were complete.      A 6 mm polyp was found in the ascending colon. The polyp was sessile.       The polyp was removed with a cold snare. Resection and retrieval were       complete.      A 12 mm polyp was found in the cecum. The polyp was sessile. Biopsies       were taken with a cold forceps for histology. Impression:           - Preparation of the colon was poor.                       - One 6 mm polyp in the sigmoid colon, removed with a                        cold snare. Resected and retrieved.                       - One 6 mm polyp in the descending colon, removed with  a cold snare. Resected and retrieved.                       - One 3 mm polyp in the transverse colon, removed with                        a cold biopsy forceps. Resected and retrieved.                       - One 6 mm polyp in the ascending colon, removed with a                        cold snare. Resected and retrieved.                       - One 12 mm polyp in the cecum.  Biopsied. Recommendation:       - Discharge patient to home.                       - Resume previous diet.                       - Continue present medications.                       - Await pathology results.                       - If cecal lesion adenomatous then will need resection.                       - Repeat colonoscopy in 3 years for surveillance. Procedure Code(s):    --- Professional ---                       [phone], Colonoscopy, flexible; with removal of tumor(s),                        polyp(s), or other lesion(s) by snare technique                       45380, 15, Colonoscopy, flexible; with biopsy, single                        or multiple Diagnosis Code(s):    --- Professional ---                       Z80.0, Family history of malignant neoplasm of                        digestive organs                       D12.0, Benign neoplasm of cecum                       D12.2, Benign neoplasm of ascending colon                       D12.3, Benign neoplasm of transverse colon (hepatic  flexure or splenic flexure)                       D12.4, Benign neoplasm of descending colon                       D12.5, Benign neoplasm of sigmoid colon CPT copyright 2018 American Medical Association. All rights reserved. The codes documented in this report are preliminary and upon coder review may  be revised to meet current compliance requirements. [name] MD, MD 05/18/2018 8:33:05 AM This report has been signed electronically. Number of Addenda: 0 Note Initiated On: 05/18/2018 7:48 AM Scope Withdrawal Time: 0 hours 16 minutes 20 seconds  Total Procedure Duration: 0 hours 28 minutes 57 seconds       [facility]

## 2018-05-18 NOTE — Transfer of Care (Signed)
Immediate Anesthesia Transfer of Care Note  Patient: Jason Montes  Procedure(s) Performed: COLONOSCOPY WITH PROPOFOL (N/A )  Patient Location: PACU  Anesthesia Type:General  Level of Consciousness: drowsy  Airway & Oxygen Therapy: Patient Spontanous Breathing and Patient connected to nasal cannula oxygen  Post-op Assessment: Report given to RN and Post -op Vital signs reviewed and stable  Post vital signs: Reviewed and stable  Last Vitals:  Vitals Value Taken Time  BP 107/47 05/18/2018  8:34 AM  Temp 36.1 C 05/18/2018  8:30 AM  Pulse 74 05/18/2018  8:34 AM  Resp 19 05/18/2018  8:34 AM  SpO2 99 % 05/18/2018  8:34 AM    Last Pain:  Vitals:   05/18/18 0830  TempSrc: Tympanic  PainSc:          Complications: No apparent anesthesia complications

## 2018-05-19 ENCOUNTER — Encounter: Payer: Self-pay | Admitting: Gastroenterology

## 2018-05-19 NOTE — Anesthesia Postprocedure Evaluation (Signed)
Anesthesia Post Note  Patient: Jason Montes  Procedure(s) Performed: COLONOSCOPY WITH PROPOFOL (N/A )  Patient location during evaluation: PACU Anesthesia Type: General Level of consciousness: awake and alert Pain management: pain level controlled Vital Signs Assessment: post-procedure vital signs reviewed and stable Respiratory status: spontaneous breathing, nonlabored ventilation, respiratory function stable and patient connected to nasal cannula oxygen Cardiovascular status: blood pressure returned to baseline and stable Postop Assessment: no apparent nausea or vomiting Anesthetic complications: no     Last Vitals:  Vitals:   05/18/18 0840 05/18/18 0850  BP: 125/60 129/79  Pulse: 73 74  Resp: 18 18  Temp:    SpO2: 98% 100%    Last Pain:  Vitals:   05/19/18 0728  TempSrc:   PainSc: 0-No pain                 Molli Barrows

## 2018-05-21 LAB — SURGICAL PATHOLOGY

## 2018-05-28 ENCOUNTER — Telehealth: Payer: Self-pay

## 2018-05-28 NOTE — Telephone Encounter (Signed)
Left vm for pt's daughter, Nira Conn to return my call to schedule follow up colonoscopy results.

## 2018-05-28 NOTE — Telephone Encounter (Signed)
-----   Message from [name], MD sent at 05/22/2018  7:03 AM EST ----- Please have the patient come in for a follow up.

## 2018-06-08 ENCOUNTER — Encounter: Payer: Self-pay | Admitting: Gastroenterology

## 2018-06-08 ENCOUNTER — Ambulatory Visit: Payer: Medicare Other | Admitting: Gastroenterology

## 2018-06-08 DIAGNOSIS — D123 Benign neoplasm of transverse colon: Secondary | ICD-10-CM

## 2018-06-10 ENCOUNTER — Other Ambulatory Visit: Payer: Self-pay

## 2018-06-10 ENCOUNTER — Telehealth: Payer: Self-pay

## 2018-06-10 DIAGNOSIS — R1907 Generalized intra-abdominal and pelvic swelling, mass and lump: Secondary | ICD-10-CM

## 2018-06-10 NOTE — Telephone Encounter (Signed)
Spoke with pt's daughter Jason Montes and advised her of pt's colonoscopy results and the need to CT scan abdomen/pelvis per Dr. Allen Norris and results.   Pt has been scheduled for CT abdomen/pelvis with contrast at [facility] outpatient imaging, [facility] Montes location on Friday, Jan 17th at 9:30am. Daughter has been advised to have him there at 9:15am and to be NPO 4 hrs prior to scan. She was notified to pick up contrast media to drink  before the scan as he will have to sit there a couple of hours if he does not get this before hand. Also, informed her he will need his creatinine check before to insure his kidneys are functioning ok. She has verbalized understanding of these instructions and location of scan.

## 2018-06-10 NOTE — Telephone Encounter (Signed)
-----   Message from [name], MD sent at 06/03/2018  8:07 AM EST ----- Let the patient know Some of his polyps were adenomas and ae is at increased risk for developing more precancerous polyps. His next colonoscopy should be in 3 years.  The lesion in his cecum did not show any abnormalities on the biopsies which in further that the lesion is outside the colon pushing in and he should be set up for a CT scan of the abdomen and pelvis to investigate this area.

## 2018-06-18 ENCOUNTER — Ambulatory Visit
Admission: RE | Admit: 2018-06-18 | Discharge: 2018-06-18 | Disposition: A | Discharge status: Home / Self Care | Payer: No Typology Code available for payment source | Source: Ambulatory Visit | Attending: Gastroenterology | Admitting: Gastroenterology

## 2018-06-18 DIAGNOSIS — R1907 Generalized intra-abdominal and pelvic swelling, mass and lump: Secondary | ICD-10-CM | POA: Diagnosis not present

## 2018-06-18 LAB — POCT I-STAT CREATININE: Creatinine, Ser: 1.5 mg/dL — ABNORMAL HIGH (ref 0.61–1.24)

## 2018-06-18 MED ORDER — IOPAMIDOL (ISOVUE-300) INJECTION 61%
85.0000 mL | Freq: Once | INTRAVENOUS | Status: AC | PRN
  Administered 2018-06-18: 85 mL via INTRAVENOUS

## 2018-06-18 MED ORDER — IOPAMIDOL (ISOVUE-300) INJECTION 61%: 100.0000 mL | Freq: Once | INTRAVENOUS | Status: DC | PRN

## 2018-06-24 ENCOUNTER — Telehealth: Payer: Self-pay

## 2018-06-24 NOTE — Telephone Encounter (Signed)
Tried contacting pt but vm box was full. Unable to leave message.

## 2018-06-24 NOTE — Telephone Encounter (Signed)
-----   Message from [name], MD sent at 06/19/2018  9:06 AM EST ----- Please have the patient come in for a follow up.

## 2018-06-25 NOTE — Telephone Encounter (Signed)
Left vm again today for pt's daughter to return my call to discuss results of CT scan.

## 2018-07-01 ENCOUNTER — Other Ambulatory Visit: Payer: Self-pay

## 2018-07-01 NOTE — Telephone Encounter (Signed)
Mailed letter for pt's daughter to return my call to discuss results of CT scan. I have left a couple of messages. No return call.

## 2018-09-07 ENCOUNTER — Other Ambulatory Visit: Payer: Self-pay

## 2018-09-07 ENCOUNTER — Ambulatory Visit (INDEPENDENT_AMBULATORY_CARE_PROVIDER_SITE_OTHER): Payer: Medicare Other | Admitting: Gastroenterology

## 2018-09-07 ENCOUNTER — Encounter: Payer: Self-pay | Admitting: Gastroenterology

## 2018-09-07 DIAGNOSIS — Z86010 Personal history of colonic polyps: Secondary | ICD-10-CM

## 2018-09-07 DIAGNOSIS — J90 Pleural effusion, not elsewhere classified: Secondary | ICD-10-CM

## 2018-09-07 NOTE — Progress Notes (Signed)
[name], MD [address]  [place]  [place], [place] [address]  Main: [phone]  Fax: [phone]    Gastroenterology Virtual/Video Visit  Referring Provider:     Administration, [facility] Primary Care Physician:  Administration, [facility] Primary Gastroenterologist:  Dr.Dietrich Ke Allen Norris Reason for Consultation:     Follow-up after CT scan        HPI:    Virtual Visit via Video Note Location of the patient: Home Location of provider: Office  Participating persons: The patient myself, Jason Montes and patient's daughter.  I connected with Shelbie Ammons on 09/07/18 at 11:15 AM EDT by a video enabled telemedicine application and verified that I am speaking with the correct person using two identifiers.   I discussed the limitations of evaluation and management by telemedicine and the availability of in person appointments. The patient expressed understanding and agreed to proceed.  Verbal consent to proceed obtained.  History of Present Illness: Jason Montes is a 68 y.o. male referred by Dr. Administration, Veterans  for follow-up after having a colonoscopy with multiple polyps that were adenomatous.  The patient was also found to have a bulging in his cecum and had a CT scan of the abdomen and pelvis.  The CT scan showed the lesion to be a lipoma in the cecum.  The patient was also found to have a nodular appearing along with pleural effusions and it was recommended that the patient have a CT scan of the chest to better evaluate his effusion. On further questioning the patient's daughter states that she knows that the patient has congestive heart failure and has had issues with fluid in the lung before.  She is not sure that she is ever been told that there was nodularity.  Past Medical History:  Diagnosis Date  . Allergy   . Atrial fibrillation ([place])   . B12 deficiency   . Chronic kidney disease   . Chronic kidney disease (CKD), stage II (mild)   .  Coronary atherosclerosis   . Diabetes mellitus without complication ([place])   . Elevated PSA   . Esophagitis   . GERD (gastroesophageal reflux disease)   . Heart attack ([place])   . Heart disease   . Hyperlipidemia   . Hypertension   . Hypokalemia   . Mild neurocognitive disorder   . Osteoarthritis   . Presbyopia   . PVD (peripheral vascular disease) ([place])   . Restless leg syndrome   . Stroke (cerebrum) ([place])   . Stroke ([place])   . Subacute osteomyelitis ([place])   . Tinea unguium   . Uncompensated short term memory deficit     Past Surgical History:  Procedure Laterality Date  . CARDIAC PACEMAKER PLACEMENT    . COLONOSCOPY WITH PROPOFOL N/A 05/18/2018   Procedure: COLONOSCOPY WITH PROPOFOL;  Surgeon: [name], MD;  Location[facility] ENDOSCOPY;  Service: Endoscopy;  Laterality: N/A;  . CORONARY ARTERY BYPASS GRAFT    . FOOT AMPUTATION Right     Prior to Admission medications   Medication Sig Start Date End Date Taking? Authorizing Provider  atorvastatin (LIPITOR) 40 MG tablet Take 40 mg by mouth daily.    [provider]  bethanechol (URECHOLINE) 10 MG tablet Take 20 mg by mouth 2 (two) times daily.     [provider]  carvedilol (COREG) 3.125 MG tablet Take 3.125 mg by mouth 2 (two) times daily with a meal.    [provider]  cetirizine (ZYRTEC) 10 MG tablet  Take 10 mg by mouth daily.    [provider]  Cholecalciferol (VITAMIN D3) 1000 units CAPS Take 1,000 Units by mouth daily.     [provider]  clotrimazole-betamethasone (LOTRISONE) cream Apply 1 application topically 2 (two) times daily.    [provider]  cyanocobalamin (,VITAMIN B-12,) 1000 MCG/ML injection Inject 1,000 mcg into the muscle every 30 (thirty) days.    [provider]  Dextrose, Diabetic Use, (DEXTROSE PO) Take 8 mg by mouth as needed (hypoglycemia).    [provider]  docusate sodium (COLACE) 100 MG capsule Take 100 mg by mouth 2  (two) times daily.    [provider]  famotidine (PEPCID) 20 MG tablet Take 20 mg by mouth 2 (two) times daily.    [provider]  fluticasone (FLONASE) 50 MCG/ACT nasal spray Place 1 spray into both nostrils daily.     [provider]  gabapentin (NEURONTIN) 600 MG tablet Take 600 mg by mouth 3 (three) times daily.     [provider]  insulin glargine (LANTUS) 100 UNIT/ML injection Inject 0.15 mLs (15 Units total) into the skin daily. 02/25/18   Mikey College, NP  isosorbide mononitrate (IMDUR) 30 MG 24 hr tablet Take 30 mg by mouth daily.    [provider]  magnesium oxide (MAG-OX) 400 MG tablet Take 400 mg by mouth daily.    [provider]  metFORMIN (GLUCOPHAGE) 500 MG tablet Start 1 tab by mouth in am daily for 7 days. Then increase to 1 tab in am & pm for 7 days.  Continue weekly increase to 2 tabs twice daily. 02/24/18   Mikey College, NP  nitroGLYCERIN (NITROSTAT) 0.4 MG SL tablet Place 0.4 mg under the tongue every 5 (five) minutes as needed for chest pain.    [provider]  senna (SENOKOT) 8.6 MG tablet Take 1 tablet by mouth daily.    [provider]  spironolactone (ALDACTONE) 25 MG tablet Take 25 mg by mouth daily.    [provider]  tamsulosin (FLOMAX) 0.4 MG CAPS capsule Take 0.4 mg by mouth daily.     [provider]  torsemide (DEMADEX) 20 MG tablet Take 20 mg by mouth 2 (two) times daily.    [provider]  warfarin (COUMADIN) 1 MG tablet Take 1 tablet (1 mg total) by mouth at bedtime. Patient not taking: Reported on 05/18/2018 02/25/18   Mikey College, NP  warfarin (COUMADIN) 7.5 MG tablet Take 1 tablet (7.5 mg total) by mouth at bedtime. Patient not taking: Reported on 05/18/2018 02/25/18   Mikey College, NP    Family History  Problem Relation Age of Onset  . Heart failure Mother   . Colon cancer Mother   . Diabetes Mother   . Thyroid  disease Sister      Social History   Tobacco Use  . Smoking status: Never Smoker  . Smokeless tobacco: Never Used  Substance Use Topics  . Alcohol use: Never    Frequency: Never  . Drug use: Never    Allergies as of 09/07/2018 - Review Complete 06/18/2018  Allergen Reaction Noted  . Norvasc [amlodipine besylate]  02/24/2018  . Penicillins  02/24/2018    Review of Systems:    All systems reviewed and negative except where noted in HPI.   Observations/Objective:  Labs: CBC No results found for: WBC, RBC, HGB, HCT, PLT, MCV, MCH, MCHC, RDW, LYMPHSABS, MONOABS, EOSABS, BASOSABS CMP  Component Value Date/Time   CREATININE 1.50 (H) 06/18/2018 0949    Imaging Studies: No results found.  Assessment and Plan:   Jason Montes is a 68 y.o. y/o male has been referred for follow-up after having a colonoscopy and CT scan of the abdomen pelvis.  The patient's lesion in the cecum appears to have been a lipoma.  No further work-up is needed for this.  The patient will have a repeat colonoscopy in 3 years due to his polyps.  The patient will also be followed by his PCP for his abnormal CT scan of the abdomen that showed bilateral pleural effusions and nodularity of the lungs.  The patient denies ever smoking in the past.  The daughter states that she will follow-up with the patient's PCP to see if this is a new finding or not and will have further investigations through them for this effusion.  The patient and his daughter have been explained the plan and agree with it.  Follow Up Instructions:  I discussed the assessment and treatment plan with the patient. The patient was provided an opportunity to ask questions and all were answered. The patient agreed with the plan and demonstrated an understanding of the instructions.   The patient was advised to call back or seek an in-person evaluation if the symptoms worsen or if the condition fails to improve as anticipated.  I  provided 14 minutes of non-face-to-face time during this encounter.   [name], MD  Speech recognition software was used to dictate the above note.

## 2018-09-28 ENCOUNTER — Ambulatory Visit: Payer: No Typology Code available for payment source | Admitting: Gastroenterology

## 2019-03-11 ENCOUNTER — Encounter: Payer: Self-pay | Admitting: Emergency Medicine

## 2019-03-11 ENCOUNTER — Other Ambulatory Visit: Payer: Self-pay

## 2019-03-11 ENCOUNTER — Inpatient Hospital Stay
Admission: EM | Admit: 2019-03-11 | Discharge: 2019-03-14 | DRG: 683 | Disposition: A | Discharge status: Home Health | Payer: Medicare Other | Attending: Internal Medicine | Admitting: Internal Medicine

## 2019-03-11 ENCOUNTER — Emergency Department: Payer: Medicare Other

## 2019-03-11 DIAGNOSIS — Z6824 Body mass index (BMI) 24.0-24.9, adult: Secondary | ICD-10-CM

## 2019-03-11 DIAGNOSIS — E538 Deficiency of other specified B group vitamins: Secondary | ICD-10-CM | POA: Diagnosis present

## 2019-03-11 DIAGNOSIS — E440 Moderate protein-calorie malnutrition: Secondary | ICD-10-CM | POA: Diagnosis present

## 2019-03-11 DIAGNOSIS — I482 Chronic atrial fibrillation, unspecified: Secondary | ICD-10-CM | POA: Diagnosis present

## 2019-03-11 DIAGNOSIS — Z66 Do not resuscitate: Secondary | ICD-10-CM | POA: Diagnosis present

## 2019-03-11 DIAGNOSIS — Z950 Presence of cardiac pacemaker: Secondary | ICD-10-CM

## 2019-03-11 DIAGNOSIS — I129 Hypertensive chronic kidney disease with stage 1 through stage 4 chronic kidney disease, or unspecified chronic kidney disease: Secondary | ICD-10-CM | POA: Diagnosis present

## 2019-03-11 DIAGNOSIS — Z800 Family history of malignant neoplasm of digestive organs: Secondary | ICD-10-CM

## 2019-03-11 DIAGNOSIS — K219 Gastro-esophageal reflux disease without esophagitis: Secondary | ICD-10-CM | POA: Diagnosis present

## 2019-03-11 DIAGNOSIS — E785 Hyperlipidemia, unspecified: Secondary | ICD-10-CM | POA: Diagnosis present

## 2019-03-11 DIAGNOSIS — I5032 Chronic diastolic (congestive) heart failure: Secondary | ICD-10-CM | POA: Diagnosis present

## 2019-03-11 DIAGNOSIS — Z951 Presence of aortocoronary bypass graft: Secondary | ICD-10-CM

## 2019-03-11 DIAGNOSIS — E1122 Type 2 diabetes mellitus with diabetic chronic kidney disease: Secondary | ICD-10-CM | POA: Diagnosis present

## 2019-03-11 DIAGNOSIS — Z20828 Contact with and (suspected) exposure to other viral communicable diseases: Secondary | ICD-10-CM | POA: Diagnosis present

## 2019-03-11 DIAGNOSIS — Z888 Allergy status to other drugs, medicaments and biological substances status: Secondary | ICD-10-CM

## 2019-03-11 DIAGNOSIS — N182 Chronic kidney disease, stage 2 (mild): Secondary | ICD-10-CM | POA: Diagnosis present

## 2019-03-11 DIAGNOSIS — T502X5A Adverse effect of carbonic-anhydrase inhibitors, benzothiadiazides and other diuretics, initial encounter: Secondary | ICD-10-CM | POA: Diagnosis present

## 2019-03-11 DIAGNOSIS — I252 Old myocardial infarction: Secondary | ICD-10-CM

## 2019-03-11 DIAGNOSIS — Z8249 Family history of ischemic heart disease and other diseases of the circulatory system: Secondary | ICD-10-CM | POA: Diagnosis not present

## 2019-03-11 DIAGNOSIS — Z79899 Other long term (current) drug therapy: Secondary | ICD-10-CM

## 2019-03-11 DIAGNOSIS — I959 Hypotension, unspecified: Secondary | ICD-10-CM

## 2019-03-11 DIAGNOSIS — N179 Acute kidney failure, unspecified: Secondary | ICD-10-CM | POA: Diagnosis present

## 2019-03-11 DIAGNOSIS — R262 Difficulty in walking, not elsewhere classified: Secondary | ICD-10-CM | POA: Diagnosis present

## 2019-03-11 DIAGNOSIS — I130 Hypertensive heart and chronic kidney disease with heart failure and stage 1 through stage 4 chronic kidney disease, or unspecified chronic kidney disease: Secondary | ICD-10-CM | POA: Diagnosis present

## 2019-03-11 DIAGNOSIS — Z833 Family history of diabetes mellitus: Secondary | ICD-10-CM | POA: Diagnosis not present

## 2019-03-11 DIAGNOSIS — R627 Adult failure to thrive: Secondary | ICD-10-CM | POA: Diagnosis present

## 2019-03-11 DIAGNOSIS — I951 Orthostatic hypotension: Secondary | ICD-10-CM | POA: Diagnosis present

## 2019-03-11 DIAGNOSIS — Z7901 Long term (current) use of anticoagulants: Secondary | ICD-10-CM

## 2019-03-11 DIAGNOSIS — E860 Dehydration: Secondary | ICD-10-CM | POA: Diagnosis present

## 2019-03-11 DIAGNOSIS — Z880 Allergy status to penicillin: Secondary | ICD-10-CM

## 2019-03-11 DIAGNOSIS — Z89431 Acquired absence of right foot: Secondary | ICD-10-CM

## 2019-03-11 DIAGNOSIS — Z794 Long term (current) use of insulin: Secondary | ICD-10-CM

## 2019-03-11 DIAGNOSIS — Z8349 Family history of other endocrine, nutritional and metabolic diseases: Secondary | ICD-10-CM

## 2019-03-11 DIAGNOSIS — E43 Unspecified severe protein-calorie malnutrition: Secondary | ICD-10-CM | POA: Insufficient documentation

## 2019-03-11 DIAGNOSIS — E871 Hypo-osmolality and hyponatremia: Secondary | ICD-10-CM | POA: Diagnosis present

## 2019-03-11 DIAGNOSIS — I2510 Atherosclerotic heart disease of native coronary artery without angina pectoris: Secondary | ICD-10-CM | POA: Diagnosis present

## 2019-03-11 DIAGNOSIS — N1831 Chronic kidney disease, stage 3a: Secondary | ICD-10-CM | POA: Diagnosis present

## 2019-03-11 LAB — CBC WITH DIFFERENTIAL/PLATELET
Abs Immature Granulocytes: 0.04 10*3/uL (ref 0.00–0.07)
Basophils Absolute: 0.1 10*3/uL (ref 0.0–0.1)
Basophils Relative: 1 %
Eosinophils Absolute: 0.3 10*3/uL (ref 0.0–0.5)
Eosinophils Relative: 4 %
HCT: 34.6 % — ABNORMAL LOW (ref 39.0–52.0)
Hemoglobin: 11.2 g/dL — ABNORMAL LOW (ref 13.0–17.0)
Immature Granulocytes: 1 %
Lymphocytes Relative: 12 %
Lymphs Abs: 1 10*3/uL (ref 0.7–4.0)
MCH: 29.3 pg (ref 26.0–34.0)
MCHC: 32.4 g/dL (ref 30.0–36.0)
MCV: 90.6 fL (ref 80.0–100.0)
Monocytes Absolute: 0.7 10*3/uL (ref 0.1–1.0)
Monocytes Relative: 9 %
Neutro Abs: 6 10*3/uL (ref 1.7–7.7)
Neutrophils Relative %: 73 %
Platelets: 133 10*3/uL — ABNORMAL LOW (ref 150–400)
RBC: 3.82 MIL/uL — ABNORMAL LOW (ref 4.22–5.81)
RDW: 13.6 % (ref 11.5–15.5)
WBC: 8.1 10*3/uL (ref 4.0–10.5)
nRBC: 0 % (ref 0.0–0.2)

## 2019-03-11 LAB — URINALYSIS, COMPLETE (UACMP) WITH MICROSCOPIC
Bilirubin Urine: NEGATIVE
Glucose, UA: >=500 mg/dL — AB
Hgb urine dipstick: NEGATIVE
Ketones, ur: NEGATIVE mg/dL
Leukocytes,Ua: NEGATIVE
Nitrite: NEGATIVE
Protein, ur: NEGATIVE mg/dL
Specific Gravity, Urine: 1.007 (ref 1.005–1.030)
pH: 5 (ref 5.0–8.0)

## 2019-03-11 LAB — GLUCOSE, CAPILLARY
Glucose-Capillary: 96 mg/dL (ref 70–99)
Glucose-Capillary: 101 mg/dL — ABNORMAL HIGH (ref 70–99)

## 2019-03-11 LAB — COMPREHENSIVE METABOLIC PANEL
ALT: 12 U/L (ref 0–44)
AST: 18 U/L (ref 15–41)
Albumin: 4.3 g/dL (ref 3.5–5.0)
Alkaline Phosphatase: 55 U/L (ref 38–126)
Anion gap: 15 (ref 5–15)
BUN: 42 mg/dL — ABNORMAL HIGH (ref 8–23)
CO2: 27 mmol/L (ref 22–32)
Calcium: 10 mg/dL (ref 8.9–10.3)
Chloride: 91 mmol/L — ABNORMAL LOW (ref 98–111)
Creatinine, Ser: 2.06 mg/dL — ABNORMAL HIGH (ref 0.61–1.24)
GFR calc Af Amer: 38 mL/min — ABNORMAL LOW (ref >60)
GFR calc non Af Amer: 33 mL/min — ABNORMAL LOW (ref >60)
Glucose, Bld: 115 mg/dL — ABNORMAL HIGH (ref 70–99)
Potassium: 4.6 mmol/L (ref 3.5–5.1)
Sodium: 133 mmol/L — ABNORMAL LOW (ref 135–145)
Total Bilirubin: 0.9 mg/dL (ref 0.3–1.2)
Total Protein: 9.1 g/dL — ABNORMAL HIGH (ref 6.5–8.1)

## 2019-03-11 LAB — HEMOGLOBIN A1C
Hgb A1c MFr Bld: 6.8 % — ABNORMAL HIGH (ref 4.8–5.6)
Mean Plasma Glucose: 148.46 mg/dL

## 2019-03-11 MED ORDER — ATORVASTATIN CALCIUM 20 MG PO TABS
40.0000 mg | ORAL_TABLET | Freq: Every day | ORAL | Status: DC
  Administered 2019-03-11 – 2019-03-13 (×3): 40 mg via ORAL
  Filled 2019-03-11 (×3): qty 2

## 2019-03-11 MED ORDER — ONDANSETRON HCL 4 MG PO TABS: 4.0000 mg | ORAL_TABLET | Freq: Four times a day (QID) | ORAL | Status: DC | PRN

## 2019-03-11 MED ORDER — SODIUM CHLORIDE 0.9 % IV BOLUS
1000.0000 mL | Freq: Once | INTRAVENOUS | Status: AC
  Administered 2019-03-11: 1000 mL via INTRAVENOUS

## 2019-03-11 MED ORDER — FLUTICASONE PROPIONATE 50 MCG/ACT NA SUSP
2.0000 | Freq: Every day | NASAL | Status: DC
  Administered 2019-03-12 – 2019-03-14 (×3): 2 via NASAL
  Filled 2019-03-11: qty 16

## 2019-03-11 MED ORDER — HYDROCODONE-ACETAMINOPHEN 5-325 MG PO TABS
1.0000 | ORAL_TABLET | ORAL | Status: DC | PRN
  Administered 2019-03-14 (×2): 2 via ORAL
  Filled 2019-03-11 (×2): qty 2

## 2019-03-11 MED ORDER — INSULIN ASPART 100 UNIT/ML ~~LOC~~ SOLN
0.0000 [IU] | Freq: Three times a day (TID) | SUBCUTANEOUS | Status: DC
  Administered 2019-03-12: 2 [IU] via SUBCUTANEOUS
  Administered 2019-03-12 – 2019-03-14 (×4): 1 [IU] via SUBCUTANEOUS
  Administered 2019-03-14: 2 [IU] via SUBCUTANEOUS
  Filled 2019-03-11 (×6): qty 1

## 2019-03-11 MED ORDER — SENNOSIDES-DOCUSATE SODIUM 8.6-50 MG PO TABS: 1.0000 | ORAL_TABLET | Freq: Every evening | ORAL | Status: DC | PRN

## 2019-03-11 MED ORDER — VITAMIN D 25 MCG (1000 UNIT) PO TABS
1000.0000 [IU] | ORAL_TABLET | Freq: Every day | ORAL | Status: DC
  Administered 2019-03-12 – 2019-03-14 (×3): 1000 [IU] via ORAL
  Filled 2019-03-11 (×3): qty 1

## 2019-03-11 MED ORDER — ONDANSETRON HCL 4 MG/2ML IJ SOLN: 4.0000 mg | Freq: Four times a day (QID) | INTRAMUSCULAR | Status: DC | PRN

## 2019-03-11 MED ORDER — BISACODYL 5 MG PO TBEC: 5.0000 mg | DELAYED_RELEASE_TABLET | Freq: Every day | ORAL | Status: DC | PRN

## 2019-03-11 MED ORDER — HEPARIN SODIUM (PORCINE) 5000 UNIT/ML IJ SOLN: 5000.0000 [IU] | Freq: Three times a day (TID) | INTRAMUSCULAR | Status: DC

## 2019-03-11 MED ORDER — APIXABAN 5 MG PO TABS
5.0000 mg | ORAL_TABLET | Freq: Two times a day (BID) | ORAL | Status: DC
  Administered 2019-03-11 – 2019-03-14 (×6): 5 mg via ORAL
  Filled 2019-03-11 (×6): qty 1

## 2019-03-11 MED ORDER — INSULIN ASPART 100 UNIT/ML ~~LOC~~ SOLN: 0.0000 [IU] | Freq: Every day | SUBCUTANEOUS | Status: DC

## 2019-03-11 MED ORDER — NITROGLYCERIN 0.4 MG SL SUBL: 0.4000 mg | SUBLINGUAL_TABLET | SUBLINGUAL | Status: DC | PRN

## 2019-03-11 MED ORDER — LORATADINE 10 MG PO TABS
10.0000 mg | ORAL_TABLET | Freq: Every day | ORAL | Status: DC
  Administered 2019-03-12 – 2019-03-14 (×3): 10 mg via ORAL
  Filled 2019-03-11 (×3): qty 1

## 2019-03-11 MED ORDER — TAMSULOSIN HCL 0.4 MG PO CAPS
0.4000 mg | ORAL_CAPSULE | Freq: Every day | ORAL | Status: DC
  Administered 2019-03-12 – 2019-03-14 (×3): 0.4 mg via ORAL
  Filled 2019-03-11 (×3): qty 1

## 2019-03-11 MED ORDER — SODIUM CHLORIDE 0.9 % IV SOLN
INTRAVENOUS | Status: DC
  Administered 2019-03-11 – 2019-03-14 (×5): via INTRAVENOUS

## 2019-03-11 MED ORDER — FAMOTIDINE 20 MG PO TABS
20.0000 mg | ORAL_TABLET | Freq: Two times a day (BID) | ORAL | Status: DC
  Administered 2019-03-11 – 2019-03-14 (×6): 20 mg via ORAL
  Filled 2019-03-11 (×6): qty 1

## 2019-03-11 MED ORDER — ACETAMINOPHEN 325 MG PO TABS: 650.0000 mg | ORAL_TABLET | Freq: Four times a day (QID) | ORAL | Status: DC | PRN

## 2019-03-11 MED ORDER — DOCUSATE SODIUM 100 MG PO CAPS
100.0000 mg | ORAL_CAPSULE | Freq: Every day | ORAL | Status: DC
  Administered 2019-03-12 – 2019-03-14 (×3): 100 mg via ORAL
  Filled 2019-03-11 (×3): qty 1

## 2019-03-11 MED ORDER — ACETAMINOPHEN 650 MG RE SUPP: 650.0000 mg | Freq: Four times a day (QID) | RECTAL | Status: DC | PRN

## 2019-03-11 MED ORDER — ASPIRIN EC 81 MG PO TBEC
81.0000 mg | DELAYED_RELEASE_TABLET | Freq: Every day | ORAL | Status: DC
  Administered 2019-03-12 – 2019-03-14 (×3): 81 mg via ORAL
  Filled 2019-03-11 (×3): qty 1

## 2019-03-11 MED ORDER — ALBUTEROL SULFATE (2.5 MG/3ML) 0.083% IN NEBU: 2.5000 mg | INHALATION_SOLUTION | RESPIRATORY_TRACT | Status: DC | PRN

## 2019-03-11 NOTE — ED Triage Notes (Addendum)
Pt arrives with family with concerns over failure to thrive. Significant wt loss in the last month. Family reports blood pressure readings in the 80s over the last 2 weeks. Pt 87/58 in triage.

## 2019-03-11 NOTE — ED Notes (Signed)
Attempted to call report to floor, bed has not been assigned to RN. Gave phone number for assigned nurse to call back.

## 2019-03-11 NOTE — H&P (Addendum)
[facility] NAME: Jason Montes    MR#:  [id number]  DATE OF BIRTH:  [birth year]  DATE OF ADMISSION:  03/11/2019  PRIMARY CARE PHYSICIAN: [facility]   REQUESTING/REFERRING PHYSICIAN: Dr. Quentin Cornwall.  CHIEF COMPLAINT:   Chief Complaint  Patient presents with  . Failure To Thrive   Poor intake, weight loss and generalized weakness for several weeks. HISTORY OF PRESENT ILLNESS:  Jason Montes  is a 68 y.o. male with a known history of multiple medical problems as below.  The patient presents the ED with above chief complaints.  The patient also complains of lightheadedness and difficulty walking.  He went to [place] [facility] and was found profound orthostatic hypotension.  He denies any nausea, vomiting or diarrhea.  He has been taking many hypertension medication and diuretics.  Chest x-ray and urinalysis are unremarkable.  COVID-19 test is pending. PAST MEDICAL HISTORY:   Past Medical History:  Diagnosis Date  . Allergy   . Atrial fibrillation ([place])   . B12 deficiency   . Chronic kidney disease   . Chronic kidney disease (CKD), stage II (mild)   . Coronary atherosclerosis   . Diabetes mellitus without complication ([place])   . Elevated PSA   . Esophagitis   . GERD (gastroesophageal reflux disease)   . Heart attack ([place])   . Heart disease   . Hyperlipidemia   . Hypertension   . Hypokalemia   . Mild neurocognitive disorder   . Osteoarthritis   . Presbyopia   . PVD (peripheral vascular disease) ([place])   . Restless leg syndrome   . Stroke (cerebrum) ([place])   . Stroke ([place])   . Subacute osteomyelitis ([place])   . Tinea unguium   . Uncompensated short term memory deficit     PAST SURGICAL HISTORY:   Past Surgical History:  Procedure Laterality Date  . CARDIAC PACEMAKER PLACEMENT    . COLONOSCOPY WITH PROPOFOL N/A 05/18/2018   Procedure: COLONOSCOPY WITH PROPOFOL;  Surgeon: [name], MD;  Location: [facility]  ENDOSCOPY;  Service: Endoscopy;  Laterality: N/A;  . CORONARY ARTERY BYPASS GRAFT    . FOOT AMPUTATION Right     SOCIAL HISTORY:   Social History   Tobacco Use  . Smoking status: Never Smoker  . Smokeless tobacco: Never Used  Substance Use Topics  . Alcohol use: Never    Frequency: Never    FAMILY HISTORY:   Family History  Problem Relation Age of Onset  . Heart failure Mother   . Colon cancer Mother   . Diabetes Mother   . Thyroid disease Sister     DRUG ALLERGIES:   Allergies  Allergen Reactions  . Norvasc [Amlodipine Besylate]   . Penicillins     REVIEW OF SYSTEMS:   Review of Systems  Constitutional: Positive for malaise/fatigue and weight loss. Negative for chills and fever.       Poor oral intake.  HENT: Negative for sore throat.   Eyes: Negative for blurred vision and double vision.  Respiratory: Negative for cough, hemoptysis, shortness of breath, wheezing and stridor.   Cardiovascular: Negative for chest pain, palpitations, orthopnea and leg swelling.  Gastrointestinal: Negative for abdominal pain, blood in stool, diarrhea, melena, nausea and vomiting.  Genitourinary: Negative for dysuria, flank pain and hematuria.  Musculoskeletal: Negative for back pain and joint pain.  Skin: Negative for rash.  Neurological: Positive for dizziness. Negative for sensory change, focal weakness, seizures, loss of consciousness, weakness and  headaches.  Endo/Heme/Allergies: Negative for polydipsia.  Psychiatric/Behavioral: Negative for depression. The patient is not nervous/anxious.     MEDICATIONS AT HOME:   Prior to Admission medications   Medication Sig Start Date End Date Taking? Authorizing Provider  atorvastatin (LIPITOR) 40 MG tablet Take 40 mg by mouth daily.    [provider]  bethanechol (URECHOLINE) 10 MG tablet Take 20 mg by mouth 2 (two) times daily.     [provider]  carvedilol (COREG) 3.125 MG tablet Take 3.125 mg by mouth 2 (two)  times daily with a meal.    [provider]  cetirizine (ZYRTEC) 10 MG tablet Take 10 mg by mouth daily.    [provider]  Cholecalciferol (VITAMIN D3) 1000 units CAPS Take 1,000 Units by mouth daily.     [provider]  clotrimazole-betamethasone (LOTRISONE) cream Apply 1 application topically 2 (two) times daily.    [provider]  cyanocobalamin (,VITAMIN B-12,) 1000 MCG/ML injection Inject 1,000 mcg into the muscle every 30 (thirty) days.    [provider]  Dextrose, Diabetic Use, (DEXTROSE PO) Take 8 mg by mouth as needed (hypoglycemia).    [provider]  docusate sodium (COLACE) 100 MG capsule Take 100 mg by mouth 2 (two) times daily.    [provider]  famotidine (PEPCID) 20 MG tablet Take 20 mg by mouth 2 (two) times daily.    [provider]  fluticasone (FLONASE) 50 MCG/ACT nasal spray Place 1 spray into both nostrils daily.     [provider]  gabapentin (NEURONTIN) 600 MG tablet Take 600 mg by mouth 3 (three) times daily.     [provider]  insulin glargine (LANTUS) 100 UNIT/ML injection Inject 0.15 mLs (15 Units total) into the skin daily. 02/25/18   Mikey College, NP  isosorbide mononitrate (IMDUR) 30 MG 24 hr tablet Take 30 mg by mouth daily.    [provider]  magnesium oxide (MAG-OX) 400 MG tablet Take 400 mg by mouth daily.    [provider]  metFORMIN (GLUCOPHAGE) 500 MG tablet Start 1 tab by mouth in am daily for 7 days. Then increase to 1 tab in am & pm for 7 days.  Continue weekly increase to 2 tabs twice daily. 02/24/18   Mikey College, NP  nitroGLYCERIN (NITROSTAT) 0.4 MG SL tablet Place 0.4 mg under the tongue every 5 (five) minutes as needed for chest pain.    [provider]  senna (SENOKOT) 8.6 MG tablet Take 1 tablet by mouth daily.    [provider]  spironolactone (ALDACTONE) 25 MG tablet Take 25 mg by mouth daily.     [provider]  tamsulosin (FLOMAX) 0.4 MG CAPS capsule Take 0.4 mg by mouth daily.     [provider]  torsemide (DEMADEX) 20 MG tablet Take 20 mg by mouth 2 (two) times daily.    [provider]  warfarin (COUMADIN) 1 MG tablet Take 1 tablet (1 mg total) by mouth at bedtime. Patient not taking: Reported on 05/18/2018 02/25/18   Mikey College, NP  warfarin (COUMADIN) 7.5 MG tablet Take 1 tablet (7.5 mg total) by mouth at bedtime. Patient not taking: Reported on 05/18/2018 02/25/18   Mikey College, NP      VITAL SIGNS:  Blood pressure (!) 93/54, pulse 78, temperature 98 F (36.7 C), temperature source Oral, resp. rate 13, height 5\' 9"  (1.753 m), weight 70.3 kg, SpO2 97 %.  PHYSICAL EXAMINATION:  Physical Exam  GENERAL:  68 y.o.-year-old patient lying in the bed with no acute distress.  EYES: Pupils equal, round, reactive to light and accommodation. No scleral icterus. Extraocular muscles intact.  HEENT: Head atraumatic, normocephalic. NECK:  Supple, no jugular venous distention. No thyroid enlargement, no tenderness.  LUNGS: Normal breath sounds bilaterally, no wheezing, rales,rhonchi or crepitation. No use of accessory muscles of respiration.  CARDIOVASCULAR: S1, S2 normal. No murmurs, rubs, or gallops.  ABDOMEN: Soft, nontender, nondistended. Bowel sounds present. No organomegaly or mass.  EXTREMITIES: No pedal edema, cyanosis, or clubbing.  NEUROLOGIC: Cranial nerves II through XII are intact. Muscle strength 5/5 in all extremities. Sensation intact. Gait not checked.  PSYCHIATRIC: The patient is alert and oriented x 3.  SKIN: No obvious rash, lesion, or ulcer.   LABORATORY PANEL:   CBC Recent Labs  Lab 03/11/19 1302  WBC 8.1  HGB 11.2*  HCT 34.6*  PLT 133*   ------------------------------------------------------------------------------------------------------------------  Chemistries  Recent Labs  Lab 03/11/19 1302  NA 133*   K 4.6  CL 91*  CO2 27  GLUCOSE 115*  BUN 42*  CREATININE 2.06*  CALCIUM 10.0  AST 18  ALT 12  ALKPHOS 55  BILITOT 0.9   ------------------------------------------------------------------------------------------------------------------  Cardiac Enzymes No results for input(s): TROPONINI in the last 168 hours. ------------------------------------------------------------------------------------------------------------------  RADIOLOGY:  Dg Chest 2 View  Result Date: 03/11/2019 CLINICAL DATA:  Failure to thrive.  Weight loss over the past month. EXAM: CHEST - 2 VIEW COMPARISON:  None. FINDINGS: The patient is status post CABG with an AICD in place. There is a very small left pleural effusion and minimal left basilar atelectasis or scar. The right lung is clear. No pneumothorax. Heart size is normal. Atherosclerosis is noted. No acute or focal bony abnormality. IMPRESSION: Trace left pleural effusion and minimal basilar atelectasis or scar. No acute disease. Atherosclerosis. Electronically Signed   By: Inge Rise M.D.   On: 03/11/2019 14:39      IMPRESSION AND PLAN:   Acute renal failure on CKD stage II due to poor oral intake and diuretics. The patient will be admitted to medical floor. Hold diuretics and metformin. Continue IV fluid support and follow-up BMP.  Orthostatic hypotension.  IV fluid support.  Hold hypertension medication and diuretics. Hyponatremia.  Normal saline IV and follow-up BMP. Chronic A. fib and CAD.  Continue aspirin, Lipitor and Eliquis pharmacy to dose. Diabetes.  Start sliding scale.  Hold metformin and Jardiance. Weight loss and failure to thrive.  Dietitian consult.  All the records are reviewed and case discussed with ED provider. Management plans discussed with the patient, son-in-law and they are in agreement.  CODE STATUS: Full code.  TOTAL TIME TAKING CARE OF THIS PATIENT: 57 minutes.    Demetrios Loll M.D on 03/11/2019 at 3:29 PM   Between 7am to 6pm - Pager - [phone]  After 6pm go to [email or web address] - [employer] [facility]  Office  [phone]  CC: Primary care physician; [facility], [facility]   Note: This dictation was prepared with Diplomatic Services operational officer dictation along with smaller Company secretary. Any transcriptional errors that result from this process are unin

## 2019-03-11 NOTE — Consult Note (Signed)
[facility] for Apixaban Indication: atrial fibrillation  Allergies  Allergen Reactions  . Norvasc [Amlodipine Besylate]   . Penicillins     Childhood allergy, not sure what happens     Patient Measurements: Height: 5\' 9"  (175.3 cm) Weight: 155 lb (70.3 kg) IBW/kg (Calculated) : 70.7  Vital Signs: Temp: 98 F (36.7 C) (10/09 1255) Temp Source: Oral (10/09 1255) BP: 107/65 (10/09 1607) Pulse Rate: 77 (10/09 1607)  Labs: Recent Labs    03/11/19 1302  HGB 11.2*  HCT 34.6*  PLT 133*  CREATININE 2.06*    Estimated Creatinine Clearance: 35.1 mL/min (A) (by C-G formula based on SCr of 2.06 mg/dL (H)).   Medical History: Past Medical History:  Diagnosis Date  . Allergy   . Atrial fibrillation ([place])   . B12 deficiency   . Chronic kidney disease   . Chronic kidney disease (CKD), stage II (mild)   . Coronary atherosclerosis   . Diabetes mellitus without complication ([place])   . Elevated PSA   . Esophagitis   . GERD (gastroesophageal reflux disease)   . Heart attack ([place])   . Heart disease   . Hyperlipidemia   . Hypertension   . Hypokalemia   . Mild neurocognitive disorder   . Osteoarthritis   . Presbyopia   . PVD (peripheral vascular disease) ([place])   . Restless leg syndrome   . Stroke (cerebrum) ([place])   . Stroke ([place])   . Subacute osteomyelitis ([place])   . Tinea unguium   . Uncompensated short term memory deficit     Medications:  (Not in a hospital admission)  Scheduled:  Infusions:  PRN:   Assessment: Pharmacy consulted to start apixaban for afib. (PTA med)  Goal of Therapy:  Monitor platelets by anticoagulation protocol: Yes   Plan:  Will start apixaban 5 mg BID. Pt only meets one out of 3 criteria for dose reduction, so will not reduce the dose. If Scr > 2.5, consider starting warfarin as this pt population was not studied. Hgb 11.2 Plt 133. Continue to monitor.   Oswald Hillock, PharmD, BCPS 03/11/2019,4:19  PM

## 2019-03-11 NOTE — ED Notes (Signed)
Patient transported to X-ray 

## 2019-03-11 NOTE — ED Provider Notes (Signed)
[facility] Emergency Department Provider Note    First MD Initiated Contact with Patient 03/11/19 1339     (approximate)  I have reviewed the triage vital signs and the nursing notes.   HISTORY  Chief Complaint Failure To Thrive    HPI Jason Montes is a 68 y.o. male below listed past medical history presents the ER for evaluation of several pound weight loss over the past several weeks as well as lightheadedness fatigue and difficulty walking.  Went to see his clinician at the [place] today was found to be profoundly orthostatic and hypotensive.  Denies any pain.  No shortness of breath fevers.  No numbness or tingling.  Does have extensive past medical history.  States he simply has not had much appetite.    Past Medical History:  Diagnosis Date  . Allergy   . Atrial fibrillation ([place])   . B12 deficiency   . Chronic kidney disease   . Chronic kidney disease (CKD), stage II (mild)   . Coronary atherosclerosis   . Diabetes mellitus without complication ([place])   . Elevated PSA   . Esophagitis   . GERD (gastroesophageal reflux disease)   . Heart attack ([place])   . Heart disease   . Hyperlipidemia   . Hypertension   . Hypokalemia   . Mild neurocognitive disorder   . Osteoarthritis   . Presbyopia   . PVD (peripheral vascular disease) ([place])   . Restless leg syndrome   . Stroke (cerebrum) ([place])   . Stroke ([place])   . Subacute osteomyelitis ([place])   . Tinea unguium   . Uncompensated short term memory deficit    Family History  Problem Relation Age of Onset  . Heart failure Mother   . Colon cancer Mother   . Diabetes Mother   . Thyroid disease Sister    Past Surgical History:  Procedure Laterality Date  . CARDIAC PACEMAKER PLACEMENT    . COLONOSCOPY WITH PROPOFOL N/A 05/18/2018   Procedure: COLONOSCOPY WITH PROPOFOL;  Surgeon: [name], MD;  Location: [facility] ENDOSCOPY;  Service: Endoscopy;  Laterality: N/A;  . CORONARY ARTERY BYPASS GRAFT     . FOOT AMPUTATION Right    Patient Active Problem List   Diagnosis Date Noted  . ARF (acute renal failure) ([place]) 03/11/2019  . Family history of malignant neoplasm of gastrointestinal tract   . Benign neoplasm of transverse colon   . Benign neoplasm of cecum   . Benign neoplasm of ascending colon   . Benign neoplasm of descending colon   . Polyp of sigmoid colon   . Atrial fibrillation ([place]) 02/25/2018  . Vitamin B12 deficiency anemia 02/25/2018  . CKD (chronic kidney disease) stage 2, GFR 60-89 ml/min 02/25/2018  . CAD (coronary artery disease) 02/25/2018  . DM (diabetes mellitus), type 2, uncontrolled with complications ([place]) [id number]  . GERD with esophagitis 02/25/2018  . Generalized muscle weakness 02/25/2018  . Chronic diastolic heart failure ([place]) 02/25/2018  . Chronic anticoagulation 02/25/2018  . 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 Hyperlipidemia 02/25/2018  . Essential hypertension 02/25/2018  . Hypokalemia 02/25/2018  . Mild neurocognitive disorder 02/25/2018  . Osteoarthritis 02/25/2018  . PVD (peripheral vascular disease) ([place]) 02/25/2018  . Presbyopia 02/25/2018  . Restless leg syndrome 02/25/2018  . History of stroke with current residual effects 02/25/2018  . Subacute osteomyelitis of right foot ([place]) 02/25/2018  . Tinea unguium 02/25/2018      Prior to Admission medications   Medication Sig Start  Date End Date Taking? Authorizing Provider  atorvastatin (LIPITOR) 40 MG tablet Take 40 mg by mouth daily.    [provider]  bethanechol (URECHOLINE) 10 MG tablet Take 20 mg by mouth 2 (two) times daily.     [provider]  carvedilol (COREG) 3.125 MG tablet Take 3.125 mg by mouth 2 (two) times daily with a meal.    [provider]  cetirizine (ZYRTEC) 10 MG tablet Take 10 mg by mouth daily.    [provider]  Cholecalciferol (VITAMIN D3) 1000 units CAPS Take 1,000 Units by mouth daily.     [provider]   clotrimazole-betamethasone (LOTRISONE) cream Apply 1 application topically 2 (two) times daily.    [provider]  cyanocobalamin (,VITAMIN B-12,) 1000 MCG/ML injection Inject 1,000 mcg into the muscle every 30 (thirty) days.    [provider]  Dextrose, Diabetic Use, (DEXTROSE PO) Take 8 mg by mouth as needed (hypoglycemia).    [provider]  docusate sodium (COLACE) 100 MG capsule Take 100 mg by mouth 2 (two) times daily.    [provider]  famotidine (PEPCID) 20 MG tablet Take 20 mg by mouth 2 (two) times daily.    [provider]  fluticasone (FLONASE) 50 MCG/ACT nasal spray Place 1 spray into both nostrils daily.     [provider]  gabapentin (NEURONTIN) 600 MG tablet Take 600 mg by mouth 3 (three) times daily.     [provider]  insulin glargine (LANTUS) 100 UNIT/ML injection Inject 0.15 mLs (15 Units total) into the skin daily. 02/25/18   Mikey College, NP  isosorbide mononitrate (IMDUR) 30 MG 24 hr tablet Take 30 mg by mouth daily.    [provider]  magnesium oxide (MAG-OX) 400 MG tablet Take 400 mg by mouth daily.    [provider]  metFORMIN (GLUCOPHAGE) 500 MG tablet Start 1 tab by mouth in am daily for 7 days. Then increase to 1 tab in am & pm for 7 days.  Continue weekly increase to 2 tabs twice daily. 02/24/18   Mikey College, NP  nitroGLYCERIN (NITROSTAT) 0.4 MG SL tablet Place 0.4 mg under the tongue every 5 (five) minutes as needed for chest pain.    [provider]  senna (SENOKOT) 8.6 MG tablet Take 1 tablet by mouth daily.    [provider]  spironolactone (ALDACTONE) 25 MG tablet Take 25 mg by mouth daily.    [provider]  tamsulosin (FLOMAX) 0.4 MG CAPS capsule Take 0.4 mg by mouth daily.     [provider]  torsemide (DEMADEX) 20 MG tablet Take 20 mg by mouth 2 (two) times daily.    [provider]  warfarin (COUMADIN) 1  MG tablet Take 1 tablet (1 mg total) by mouth at bedtime. Patient not taking: Reported on 05/18/2018 02/25/18   Mikey College, NP  warfarin (COUMADIN) 7.5 MG tablet Take 1 tablet (7.5 mg total) by mouth at bedtime. Patient not taking: Reported on 05/18/2018 02/25/18   Mikey College, NP    Allergies Norvasc [amlodipine besylate] and Penicillins    Social History Social History   Tobacco Use  . Smoking status: Never Smoker  . Smokeless tobacco: Never Used  Substance Use Topics  . Alcohol use: Never    Frequency: Never  . Drug use: Never    Review of Systems Patient denies headaches, rhinorrhea, blurry vision, numbness, shortness of breath, chest pain, edema, cough,  abdominal pain, nausea, vomiting, diarrhea, dysuria, fevers, rashes or hallucinations unless otherwise stated above in HPI. ____________________________________________   PHYSICAL EXAM:  VITAL SIGNS: Vitals:   03/11/19 1430 03/11/19 1445  BP: (!) 93/54   Pulse: 78 78  Resp: 16 13  Temp:    SpO2: 96% 97%    Constitutional: Alert and oriented.  Eyes: Conjunctivae are normal.  Head: Atraumatic. Nose: No congestion/rhinnorhea. Mouth/Throat: Mucous membranes are dry.   Neck: No stridor. Painless ROM.  Cardiovascular: Normal rate, regular rhythm. Grossly normal heart sounds.  Good peripheral circulation. Respiratory: Normal respiratory effort.  No retractions. Lungs CTAB. Gastrointestinal: Soft and nontender. No distention. No abdominal bruits. No CVA tenderness. Genitourinary:  Musculoskeletal: No lower extremity tenderness nor edema.  No joint effusions. Neurologic:  Normal speech and language. No gross focal neurologic deficits are appreciated.  Skin:  Skin is warm, dry and intact. No rash noted. Psychiatric: Mood and affect are normal. Speech and behavior are normal.  ____________________________________________   LABS (all labs ordered are listed, but only abnormal results are displayed)   Results for orders placed or performed during the hospital encounter of 03/11/19 (from the past 24 hour(s))  CBC with Differential     Status: Abnormal   Collection Time: 03/11/19  1:02 PM  Result Value Ref Range   WBC 8.1 4.0 - 10.5 K/uL   RBC 3.82 (L) 4.22 - 5.81 MIL/uL   Hemoglobin 11.2 (L) 13.0 - 17.0 g/dL   HCT 34.6 (L) 39.0 - 52.0 %   MCV 90.6 80.0 - 100.0 fL   MCH 29.3 26.0 - 34.0 pg   MCHC 32.4 30.0 - 36.0 g/dL   RDW 13.6 11.5 - 15.5 %   Platelets 133 (L) 150 - 400 K/uL   nRBC 0.0 0.0 - 0.2 %   Neutrophils Relative % 73 %   Neutro Abs 6.0 1.7 - 7.7 K/uL   Lymphocytes Relative 12 %   Lymphs Abs 1.0 0.7 - 4.0 K/uL   Monocytes Relative 9 %   Monocytes Absolute 0.7 0.1 - 1.0 K/uL   Eosinophils Relative 4 %   Eosinophils Absolute 0.3 0.0 - 0.5 K/uL   Basophils Relative 1 %   Basophils Absolute 0.1 0.0 - 0.1 K/uL   WBC Morphology MORPHOLOGY UNREMARKABLE    RBC Morphology MORPHOLOGY UNREMARKABLE    Smear Review      PLATELET CLUMPING, SUGGEST RECOLLECTION OF SAMPLE IN CITRATE TUBE.   Immature Granulocytes 1 %   Abs Immature Granulocytes 0.04 0.00 - 0.07 K/uL  Comprehensive metabolic panel     Status: Abnormal   Collection Time: 03/11/19  1:02 PM  Result Value Ref Range   Sodium 133 (L) 135 - 145 mmol/L   Potassium 4.6 3.5 - 5.1 mmol/L   Chloride 91 (L) 98 - 111 mmol/L   CO2 27 22 - 32 mmol/L   Glucose, Bld 115 (H) 70 - 99 mg/dL   BUN 42 (H) 8 - 23 mg/dL   Creatinine, Ser 2.06 (H) 0.61 - 1.24 mg/dL   Calcium 10.0 8.9 - 10.3 mg/dL   Total Protein 9.1 (H) 6.5 - 8.1 g/dL   Albumin 4.3 3.5 - 5.0 g/dL   AST 18 15 - 41 U/L   ALT 12 0 - 44 U/L   Alkaline Phosphatase 55 38 - 126 U/L   Total Bilirubin 0.9 0.3 - 1.2 mg/dL   GFR calc non Af Amer 33 (L) >60 mL/min   GFR calc Af Amer 38 (L) >60 mL/min  Anion gap 15 5 - 15  Urinalysis, Complete w Microscopic     Status: Abnormal   Collection Time: 03/11/19  2:36 PM  Result Value Ref Range   Color, Urine YELLOW (A) YELLOW    APPearance CLEAR (A) CLEAR   Specific Gravity, Urine 1.007 1.005 - 1.030   pH 5.0 5.0 - 8.0   Glucose, UA >=500 (A) NEGATIVE mg/dL   Hgb urine dipstick NEGATIVE NEGATIVE   Bilirubin Urine NEGATIVE NEGATIVE   Ketones, ur NEGATIVE NEGATIVE mg/dL   Protein, ur NEGATIVE NEGATIVE mg/dL   Nitrite NEGATIVE NEGATIVE   Leukocytes,Ua NEGATIVE NEGATIVE   RBC / HPF 0-5 0 - 5 RBC/hpf   WBC, UA 0-5 0 - 5 WBC/hpf   Bacteria, UA RARE (A) NONE SEEN   Squamous Epithelial / LPF 0-5 0 - 5   Mucus PRESENT    Hyaline Casts, UA PRESENT    ____________________________________________  EKG My review and personal interpretation at Time: 14:36 Indication: weakness  Rate: 70  Rhythm: sinus Axis: normal Other: inferolateral t wave inversions ____________________________________________  RADIOLOGY  I personally reviewed all radiographic images ordered to evaluate for the above acute complaints and reviewed radiology reports and findings.  These findings were personally discussed with the patient.  Please see medical record for radiology report.  ____________________________________________   PROCEDURES  Procedure(s) performed:  .Critical Care Performed by: Merlyn Lot, MD Authorized by: Merlyn Lot, MD   Critical care provider statement:    Critical care time (minutes):  30   Critical care time was exclusive of:  Separately billable procedures and treating other patients   Critical care was necessary to treat or prevent imminent or life-threatening deterioration of the following conditions:  Dehydration   Critical care was time spent personally by me on the following activities:  Development of treatment plan with patient or surrogate, discussions with consultants, evaluation of patient's response to treatment, examination of patient, obtaining history from patient or surrogate, ordering and performing treatments and interventions, ordering and review of laboratory studies, ordering and  review of radiographic studies, pulse oximetry, re-evaluation of patient's condition and review of old charts      Critical Care performed: yes ____________________________________________   INITIAL IMPRESSION / [place] / ED COURSE  Pertinent labs & imaging results that were available during my care of the patient were reviewed by me and considered in my medical decision making (see chart for details).   DDX: dehydration, aki, failure to thrive, sepsis, electrolyte abnormality  Jason Montes is a 68 y.o. who presents to the ED with generalized weakness and difficulty walking.  Patient afebrile denies any pain but appears unwell.  Clinically appears dehydrated.  Blood pressure low but lower suspicion for sepsis or infectious process.  Renal function is much worse than previous likely secondary to dehydration.  Will give IVF.  EKG unchanged from previous document earlier today which was reportedly his baseline EKG.  In absence of chest pain does not seem consistent with ACS.  Clinical Course as of Mar 10 1534  Fri Mar 11, 2019  1531 Patient still with soft blood pressures despite IV hydration.  Will discuss with hospitalist patient does appear clinically dehydrated likely secondary to poor p.o. intake.  Have discussed with the patient and available family all diagnostics and treatments performed thus far and all questions were answered to the best of my ability. The patient demonstrates understanding and agreement with plan.    [PR]    Clinical Course User Index [  PR] Merlyn Lot, MD    The patient was evaluated in Emergency Department today for the symptoms described in the history of present illness. He/she was evaluated in the context of the global COVID-19 pandemic, which necessitated consideration that the patient might be at risk for infection with the SARS-CoV-2 virus that causes COVID-19. Institutional protocols and algorithms that pertain to the evaluation of  patients at risk for COVID-19 are in a state of rapid change based on information released by regulatory bodies including the CDC and federal and state organizations. These policies and algorithms were followed during the patient's care in the ED.  As part of my medical decision making, I reviewed the following data within the [place] notes reviewed and incorporated, Labs reviewed, notes from prior ED visits and [place] Controlled Substance Database   ____________________________________________   FINAL CLINICAL IMPRESSION(S) / ED DIAGNOSES  Final diagnoses:  Dehydration  Hypotension, unspecified hypotension type      NEW MEDICATIONS STARTED DURING THIS VISIT:  New Prescriptions   No medications on file     Note:  This document was prepared using Dragon voice recognition software and may include unintentional dictation errors.    Merlyn Lot, MD 03/11/19 1535

## 2019-03-11 NOTE — Progress Notes (Signed)
Advanced Care Plan.  Purpose of Encounter: CODE STATUS. Parties in Attendance: The patient, his son-in-law and me. Patient's Decisional Capacity: Yes. Medical Story: Jason Montes  is a 68 y.o. male with a known history of A. fib, CKD stage II, CHF, hypertension, diabetes, PVD, esophagitis, GERD, stroke and etc.  The patient is being admitted for acute renal failure on CKD stage 2, poor intake and failure to thrive.  I discussed with the patient about his current condition, prognosis and CODE STATUS.  The patient does not want to be resuscitated or intubated if he has cardiopulmonary arrest. Plan:  Code Status: DNR.  Time spent discussing advance care planning: 18 minutes.

## 2019-03-12 LAB — CBC
HCT: 32.6 % — ABNORMAL LOW (ref 39.0–52.0)
Hemoglobin: 10.5 g/dL — ABNORMAL LOW (ref 13.0–17.0)
MCH: 29.2 pg (ref 26.0–34.0)
MCHC: 32.2 g/dL (ref 30.0–36.0)
MCV: 90.8 fL (ref 80.0–100.0)
Platelets: UNDETERMINED 10*3/uL (ref 150–400)
RBC: 3.59 MIL/uL — ABNORMAL LOW (ref 4.22–5.81)
RDW: 13.5 % (ref 11.5–15.5)
WBC: 6.3 10*3/uL (ref 4.0–10.5)
nRBC: 0 % (ref 0.0–0.2)

## 2019-03-12 LAB — BASIC METABOLIC PANEL
Anion gap: 10 (ref 5–15)
BUN: 39 mg/dL — ABNORMAL HIGH (ref 8–23)
CO2: 28 mmol/L (ref 22–32)
Calcium: 9.4 mg/dL (ref 8.9–10.3)
Chloride: 99 mmol/L (ref 98–111)
Creatinine, Ser: 1.9 mg/dL — ABNORMAL HIGH (ref 0.61–1.24)
GFR calc Af Amer: 42 mL/min — ABNORMAL LOW (ref >60)
GFR calc non Af Amer: 36 mL/min — ABNORMAL LOW (ref >60)
Glucose, Bld: 108 mg/dL — ABNORMAL HIGH (ref 70–99)
Potassium: 4.2 mmol/L (ref 3.5–5.1)
Sodium: 137 mmol/L (ref 135–145)

## 2019-03-12 LAB — HIV ANTIBODY (ROUTINE TESTING W REFLEX): HIV Screen 4th Generation wRfx: NONREACTIVE

## 2019-03-12 LAB — GLUCOSE, CAPILLARY
Glucose-Capillary: 99 mg/dL (ref 70–99)
Glucose-Capillary: 142 mg/dL — ABNORMAL HIGH (ref 70–99)
Glucose-Capillary: 152 mg/dL — ABNORMAL HIGH (ref 70–99)
Glucose-Capillary: 139 mg/dL — ABNORMAL HIGH (ref 70–99)

## 2019-03-12 LAB — MAGNESIUM: Magnesium: 2.7 mg/dL — ABNORMAL HIGH (ref 1.7–2.4)

## 2019-03-12 LAB — SARS CORONAVIRUS 2 (TAT 6-24 HRS): SARS Coronavirus 2: NEGATIVE

## 2019-03-12 MED ORDER — GABAPENTIN 300 MG PO CAPS
600.0000 mg | ORAL_CAPSULE | Freq: Two times a day (BID) | ORAL | Status: DC
  Administered 2019-03-12 – 2019-03-14 (×5): 600 mg via ORAL
  Filled 2019-03-12 (×5): qty 2

## 2019-03-12 NOTE — Progress Notes (Signed)
Physical Therapy Evaluation Patient Details Name: Jason Montes MRN: [id number] DOB: [birth year] Today's Date: 03/12/2019   History of Present Illness  Jason Montes  is a 68 y.o. male with a known history of multiple medical problems.  The patient presented to the ED with complaints of lightheadedness and difficulty walking.  He went to [place] clinic and was found profound orthostatic hypotension.  He denies any nausea, vomiting or diarrhea.  He has been taking many hypertension medication and diuretics.  Chest x-ray and urinalysis are unremarkable.  COVID-19 test is negative.  Clinical Impression  Pt admitted with above diagnosis. Pt currently with functional limitations due to the deficits listed below (see PT Problem List).  Pt is independent for bed mobility. H requires supervision for transfers and performed sit to stand transfer from EOB with min guard for safety purposes of the evaluation, however can safely complete with supervision. Pt is able to ambulate with a slowed cadence and equal step length.  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  Pt also has a decreased R visual field, contributing to occassional veering. Given deconditioning and some balance deficits recommend HH PT at discharge. Pt encouraged to use walker for ambulation at discharge for additional safety. Pt will benefit from PT services to address deficits in strength, balance, and mobility in order to return to full function at home.      Follow Up Recommendations Home health PT    Equipment Recommendations  None recommended by PT;Other (comment)(Pt encouraged to use walker at discharge)    Recommendations for Other Services       Precautions / Restrictions Precautions Precautions: None;Fall Restrictions Weight Bearing Restrictions: No      Mobility  Bed Mobility Overal bed mobility: Independent             General bed mobility comments: Good speed/sequencing  Transfers Overall transfer level:  Needs assistance Equipment used: Rolling walker (2 wheeled) Transfers: Sit to/from Stand Sit to Stand: Supervision         General transfer comment: performed sit to stand transfer from EOB with min guard for safety purposes of the evaluation, however can safely complete with supervision  Ambulation/Gait Ambulation/Gait assistance: Min guard Gait Distance (Feet): 100 Feet Assistive device: Rolling walker (2 wheeled)       General Gait Details: able to ambulate with a slowed cadence and equal step length.  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  Pt also has a decreased R visual field, contributing to occassional veering.  Stairs            Wheelchair Mobility    Modified Rankin (Stroke Patients Only)       Balance Overall balance assessment: Needs assistance Sitting-balance support: No upper extremity supported Sitting balance-Leahy Scale: Good     Standing balance support: No upper extremity supported Standing balance-Leahy Scale: Fair Standing balance comment: Pt is able to maintain narrow stance with min guard for up to 12s before LOB; additionally, he can maintain a semi-tandem stance bilaterally for up to 10s with min guard; demonstrates difficulty with L SLS, requiring assistance for LOB; R SLS was not attempted today.                             Pertinent Vitals/Pain Pain Assessment: No/denies pain    Home Living Family/patient expects to be discharged to:: Private residence Living Arrangements: Children(Daughter and son-in-law) Available Help at Discharge: Family;Personal care attendant;Available PRN/intermittently(Personal care  attendant 5d/wk) Type of Home: House Home Access: Ramped entrance(5 steps at garage, bilateral wide hand rails)     Home Layout: Two level;Able to live on main level with bedroom/bathroom Home Equipment: Kasandra Knudsen - single point;Walker - 2 wheels;Grab bars - toilet;Grab bars - tub/shower;Shower seat(no BSC, wc, toilet  riser)      Prior Function Level of Independence: Needs assistance   Gait / Transfers Assistance Needed: Ambulates with single point cane. Independent with ADLs, assist from family/aid with IADLs           Hand Dominance   Dominant Hand: Left    Extremity/Trunk Assessment   Upper Extremity Assessment Upper Extremity Assessment: Overall WFL for tasks assessed    Lower Extremity Assessment Lower Extremity Assessment: Overall WFL for tasks assessed       Communication   Communication: No difficulties  Cognition Arousal/Alertness: Awake/alert Behavior During Therapy: WFL for tasks assessed/performed Overall Cognitive Status: Within Functional Limits for tasks assessed                                        General Comments      Exercises     Assessment/Plan    PT Assessment Patient needs continued PT services  PT Problem List Decreased strength;Decreased activity tolerance;Decreased balance;Decreased mobility;Decreased knowledge of use of DME;Decreased safety awareness;Decreased knowledge of precautions;Decreased coordination       PT Treatment Interventions DME instruction;Gait training;Stair training;Functional mobility training;Therapeutic activities;Therapeutic exercise;Balance training;Neuromuscular re-education;Patient/family education    PT Goals (Current goals can be found in the Care Plan section)  Acute Rehab PT Goals Patient Stated Goal: Return to prior level of function at home PT Goal Formulation: With patient Time For Goal Achievement: 03/26/19 Potential to Achieve Goals: Fair    Frequency Min 2X/week   Barriers to discharge        Co-evaluation               AM-PAC PT "6 Clicks" Mobility  Outcome Measure Help needed turning from your back to your side while in a flat bed without using bedrails?: None Help needed moving from lying on your back to sitting on the side of a flat bed without using bedrails?: None Help  needed moving to and from a bed to a chair (including a wheelchair)?: A Little Help needed standing up from a chair using your arms (e.g., wheelchair or bedside chair)?: A Little Help needed to walk in hospital room?: A Little Help needed climbing 3-5 steps with a railing? : A Lot 6 Click Score: 19    End of Session Equipment Utilized During Treatment: Gait belt Activity Tolerance: Patient tolerated treatment well Patient left: in chair;with call bell/phone within reach;with chair alarm set   PT Visit Diagnosis: Unsteadiness on feet (R26.81);Other abnormalities of gait and mobility (R26.89);Muscle weakness (generalized) (M62.81)    Time: [id number] PT Time Calculation (min) (ACUTE ONLY): 34 min   Charges:   PT Evaluation $PT Eval Low Complexity: 1 Low PT Treatments $Therapeutic Activity: 8-22 mins        Lyndel Safe Rodriguez Aguinaldo PT, DPT, GCS   Simonne Boulos 03/12/2019, 5:01 PM

## 2019-03-12 NOTE — Progress Notes (Signed)
[facility] [place] NAME: Jason Montes    MR#:  [id number]  DATE OF BIRTH:  20-May-1951  SUBJECTIVE:  CHIEF COMPLAINT:   Chief Complaint  Patient presents with  . Failure To Thrive   No new complaint this morning.  No fevers.  Patient was able to ambulate to the bathroom using walker.  Patient reports feeling much better today.  REVIEW OF SYSTEMS:  Review of Systems  Constitutional: Negative for chills and fever.  HENT: Negative for hearing loss and tinnitus.   Eyes: Negative for blurred vision and double vision.  Respiratory: Negative for cough and hemoptysis.   Cardiovascular: Negative for chest pain and palpitations.  Gastrointestinal: Negative for heartburn, nausea and vomiting.  Genitourinary: Negative for dysuria and urgency.  Musculoskeletal: Negative for myalgias and neck pain.  Skin: Negative for itching and rash.  Neurological: Negative for dizziness and headaches.       Generalized weakness  Psychiatric/Behavioral: Negative for depression and hallucinations.    DRUG ALLERGIES:   Allergies  Allergen Reactions  . Norvasc [Amlodipine Besylate]   . Penicillins     Childhood allergy, not sure what happens    VITALS:  Blood pressure (!) 109/59, pulse 77, temperature 98 F (36.7 C), temperature source Oral, resp. rate 16, height 5\' 9"  (1.753 m), weight 70.3 kg, SpO2 97 %. PHYSICAL EXAMINATION:  Physical Exam  GENERAL:  68 y.o.-year-old patient lying in the bed with no acute distress.  EYES: Pupils equal, round, reactive to light and accommodation. No scleral icterus. Extraocular muscles intact.  HEENT: Head atraumatic, normocephalic. NECK:  Supple, no jugular venous distention. No thyroid enlargement, no tenderness.  LUNGS: Normal breath sounds bilaterally, no wheezing, rales,rhonchi or crepitation. No use of accessory muscles of respiration.  CARDIOVASCULAR: S1, S2 normal. No murmurs, rubs, or gallops.  ABDOMEN:  Soft, nontender, nondistended. Bowel sounds present. No organomegaly or mass.  EXTREMITIES: No pedal edema, cyanosis, or clubbing.  NEUROLOGIC: Cranial nerves II through XII are intact. Muscle strength 5/5 in all extremities. Sensation intact. Gait not checked.  PSYCHIATRIC: The patient is alert and oriented x 3.  SKIN: No obvious rash, lesion, or ulcer.    LABORATORY PANEL:  Male CBC Recent Labs  Lab 03/12/19 0422  WBC 6.3  HGB 10.5*  HCT 32.6*  PLT PLATELET CLUMPS NOTED ON SMEAR, UNABLE TO ESTIMATE   ------------------------------------------------------------------------------------------------------------------ Chemistries  Recent Labs  Lab 03/11/19 1302 03/12/19 0454  NA 133* 137  K 4.6 4.2  CL 91* 99  CO2 27 28  GLUCOSE 115* 108*  BUN 42* 39*  CREATININE 2.06* 1.90*  CALCIUM 10.0 9.4  MG  --  2.7*  AST 18  --   ALT 12  --   ALKPHOS 55  --   BILITOT 0.9  --    RADIOLOGY:  Dg Chest 2 View  Result Date: 03/11/2019 CLINICAL DATA:  Failure to thrive.  Weight loss over the past month. EXAM: CHEST - 2 VIEW COMPARISON:  None. FINDINGS: The patient is status post CABG with an AICD in place. There is a very small left pleural effusion and minimal left basilar atelectasis or scar. The right lung is clear. No pneumothorax. Heart size is normal. Atherosclerosis is noted. No acute or focal bony abnormality. IMPRESSION: Trace left pleural effusion and minimal basilar atelectasis or scar. No acute disease. Atherosclerosis. Electronically Signed   By: Inge Rise M.D.   On: 03/11/2019 14:39   ASSESSMENT AND PLAN:  1. Acute kidney injury superimposed on chronic kidney disease stage II due to poor oral intake and diuretics. Renal function already improving with IV fluid hydration. Metformin and diuretics on hold.  Follow-up on BMP in a.m. with ongoing IV fluid hydration.  2. Orthostatic hypotension.  IV fluid support.  Hold hypertension medication and diuretics.   3.Hyponatremia.  Likely due to dehydration from diuretics.  Resolved with IV fluid hydration with sodium level improving from 133-137.  Diuretics on hold.    4.Chronic A. fib and CAD.  Continue aspirin, Lipitor and Eliquis pharmacy to dose.  5. Diabetes.  Start sliding scale.  Hold metformin and Jardiance. Blood sugars controlled  6. Weight loss and failure to thrive.  Dietitian consult. Generalized weakness.  Physical therapy to evaluate and treat  DVT prophylaxis; patient on Eliquis  All the records are reviewed and case discussed with Care Management/Social Worker. Management plans discussed with the patient, family and they are in agreement.  CODE STATUS: DNR  TOTAL TIME TAKING CARE OF THIS PATIENT: 34 minutes.   More than 50% of the time was spent in counseling/coordination of care: YES  POSSIBLE D/C IN 2 DAYS, DEPENDING ON CLINICAL CONDITION.   Jessee Mezera M.D on 03/12/2019 at 12:55 PM  Between 7am to 6pm - Pager - [phone]  After 6pm go to [email or web address] - Proofreader  [facility]  Office  [phone]  CC: Primary care physician; [facility], [facility]  Note: This dictation was prepared with Dragon dictation along with smaller phrase technology. Any transcriptional errors that result from this process are unintentional.

## 2019-03-13 DIAGNOSIS — E43 Unspecified severe protein-calorie malnutrition: Secondary | ICD-10-CM | POA: Insufficient documentation

## 2019-03-13 DIAGNOSIS — E440 Moderate protein-calorie malnutrition: Secondary | ICD-10-CM | POA: Insufficient documentation

## 2019-03-13 LAB — BASIC METABOLIC PANEL
Anion gap: 10 (ref 5–15)
BUN: 21 mg/dL (ref 8–23)
CO2: 23 mmol/L (ref 22–32)
Calcium: 9.2 mg/dL (ref 8.9–10.3)
Chloride: 105 mmol/L (ref 98–111)
Creatinine, Ser: 1.2 mg/dL (ref 0.61–1.24)
GFR calc Af Amer: >60 mL/min (ref >60)
GFR calc non Af Amer: >60 mL/min (ref >60)
Glucose, Bld: 111 mg/dL — ABNORMAL HIGH (ref 70–99)
Potassium: 3.6 mmol/L (ref 3.5–5.1)
Sodium: 138 mmol/L (ref 135–145)

## 2019-03-13 LAB — CBC
HCT: 31.6 % — ABNORMAL LOW (ref 39.0–52.0)
Hemoglobin: 10.3 g/dL — ABNORMAL LOW (ref 13.0–17.0)
MCH: 30 pg (ref 26.0–34.0)
MCHC: 32.6 g/dL (ref 30.0–36.0)
MCV: 92.1 fL (ref 80.0–100.0)
Platelets: UNDETERMINED 10*3/uL (ref 150–400)
RBC: 3.43 MIL/uL — ABNORMAL LOW (ref 4.22–5.81)
RDW: 13.8 % (ref 11.5–15.5)
WBC: 5.4 10*3/uL (ref 4.0–10.5)
nRBC: 0 % (ref 0.0–0.2)

## 2019-03-13 LAB — MAGNESIUM: Magnesium: 2.6 mg/dL — ABNORMAL HIGH (ref 1.7–2.4)

## 2019-03-13 LAB — GLUCOSE, CAPILLARY
Glucose-Capillary: 97 mg/dL (ref 70–99)
Glucose-Capillary: 146 mg/dL — ABNORMAL HIGH (ref 70–99)
Glucose-Capillary: 126 mg/dL — ABNORMAL HIGH (ref 70–99)
Glucose-Capillary: 164 mg/dL — ABNORMAL HIGH (ref 70–99)

## 2019-03-13 MED ORDER — ENSURE MAX PROTEIN PO LIQD
11.0000 [oz_av] | Freq: Two times a day (BID) | ORAL | Status: DC
  Administered 2019-03-13 – 2019-03-14 (×3): 11 [oz_av] via ORAL
  Filled 2019-03-13: qty 330

## 2019-03-13 NOTE — Progress Notes (Signed)
Initial Nutrition Assessment  DOCUMENTATION CODES:   Non-severe (moderate) malnutrition in context of social or environmental circumstances  INTERVENTION:  Provide Ensure Max Protein po BID, each supplement provides 150 kcal and 30 grams of protein.  Encouraged adequate intake of calories and protein at meals. Discussed choosing a good source of protein at each meal and snack. Discussed which foods contain protein.  NUTRITION DIAGNOSIS:   Moderate Malnutrition related to social / environmental circumstances(decreased appetite, inadequate oral intake) as evidenced by moderate fat depletion, moderate muscle depletion.  GOAL:   Patient will meet greater than or equal to 90% of their needs  MONITOR:   PO intake, Supplement acceptance, Labs, Weight trends, I & O's  REASON FOR ASSESSMENT:   Malnutrition Screening Tool, Consult Assessment of nutrition requirement/status  ASSESSMENT:   68 year old male with PMHx of DM, hx MI s/p CABG, hx cardiac pacemaker placement, HTN, HLD, CKD stage II, A-fib, vitamin B12 deficiency, esophagitis, GERD, mild neurocognitive disorder, PVD, hx CVA, hx right foot amputation admitted with AKI superimposed on CKD stage II, orthostatic hypotension, hyponatremia.   Met with patient at bedside. He reports he began having a decreased appetite and intake in February after being started on semaglutide injections. He was still eating 3 meals per day but was eating less at meals. Over the past 2-3 weeks PTA his appetite worsened further and he was eating very small amounts. He would have 1/2 slice toast for breakfast, 1/2 sandwich in the afternoon, and a few bites at dinner. Discussed importance of adequate intake and patient's nutrition status. He is amenable to drinking ONS to help meet calorie/protein needs. Patient called his daughter on his personal phone while RD in the room so daughter could be updated on nutrition assessment and plan. Daughter reports that  patient is depressed and often will not eat related to that. Patient reports his appetite is improving now and he ate 100% of breakfast this morning (verified in chart).  Patient reports his UBW was 174 lbs back in February and he has been slowly losing weight since then. Per chart he was 79.8 kg on 05/18/2018. RD obtained bed scale weight of 74.7 kg (164.68 lbs) today. Patient has lost 5.1 kg (6.4% body weight) over the past 10 months, which is not significant for time frame.  Medications reviewed and include: Eliquis, vitamin D3 1000 untis daily, Colace 100 mg daily, famotidine, gabapentin, Novolog 0-9 units TID, Novolog 0-5 units QHS, Flomax, NS at 75 mL/hr.  Labs reviewed: CBG 97-152, Magnesium 2.6. HgbA1c 6.8 on 10/9.  NUTRITION - FOCUSED PHYSICAL EXAM:    Most Recent Value  Orbital Region  Moderate depletion  Upper Arm Region  Severe depletion  Thoracic and Lumbar Region  Moderate depletion  Buccal Region  Moderate depletion  Temple Region  Severe depletion  Clavicle Bone Region  Moderate depletion  Clavicle and Acromion Bone Region  Moderate depletion  Scapular Bone Region  Moderate depletion  Dorsal Hand  Moderate depletion  Patellar Region  Moderate depletion  Anterior Thigh Region  Moderate depletion  Posterior Calf Region  Moderate depletion  Edema (RD Assessment)  None  Hair  Reviewed  Eyes  Reviewed  Mouth  Reviewed  Skin  Reviewed  Nails  Reviewed     Diet Order:   Diet Order            Diet Carb Modified Fluid consistency: Thin; Room service appropriate? Yes  Diet effective now               EDUCATION NEEDS:   Education needs have been addressed  Skin:  Skin Assessment: Reviewed RN Assessment(s/p amputation right foot)  Last BM:  03/11/2019 - small type 4  Height:   Ht Readings from Last 1 Encounters:  03/11/19 5' 9" (1.753 m)   Weight:   Wt Readings from Last 1 Encounters:  03/13/19 74.7 kg   Ideal Body Weight:  72.7 kg  BMI:  Body mass index  is 24.32 kg/m.  Estimated Nutritional Needs:   Kcal:  1850-2050  Protein:  93-103 grams  Fluid:  1.8-2 L/day   Stephens, MS, RD, LDN Office: [phone] Pager: [phone] After Hours/Weekend Pager: [phone]  

## 2019-03-13 NOTE — Progress Notes (Signed)
[facility] [place] NAME: Jason Montes    MR#:  [id number]  DATE OF BIRTH:  1950-10-22  SUBJECTIVE:  CHIEF COMPLAINT:   Chief Complaint  Patient presents with  . Failure To Thrive   No new complaint this morning.  No fevers.  Patient reports continued improvement.  Physical therapy working with patient.Marland Kitchen  REVIEW OF SYSTEMS:  Review of Systems  Constitutional: Negative for chills and fever.  HENT: Negative for hearing loss and tinnitus.   Eyes: Negative for blurred vision and double vision.  Respiratory: Negative for cough and hemoptysis.   Cardiovascular: Negative for chest pain and palpitations.  Gastrointestinal: Negative for heartburn, nausea and vomiting.  Genitourinary: Negative for dysuria and urgency.  Musculoskeletal: Negative for myalgias and neck pain.  Skin: Negative for itching and rash.  Neurological: Negative for dizziness and headaches.       Generalized weakness  Psychiatric/Behavioral: Negative for depression and hallucinations.    DRUG ALLERGIES:   Allergies  Allergen Reactions  . Norvasc [Amlodipine Besylate]   . Penicillins     Childhood allergy, not sure what happens    VITALS:  Blood pressure 109/65, pulse 74, temperature 98.5 F (36.9 C), temperature source Oral, resp. rate 16, height 5\' 9"  (1.753 m), weight 74.7 kg, SpO2 97 %. PHYSICAL EXAMINATION:  Physical Exam  GENERAL:  68 y.o.-year-old patient lying in the bed with no acute distress.  EYES: Pupils equal, round, reactive to light and accommodation. No scleral icterus. Extraocular muscles intact.  HEENT: Head atraumatic, normocephalic. NECK:  Supple, no jugular venous distention. No thyroid enlargement, no tenderness.  LUNGS: Normal breath sounds bilaterally, no wheezing, rales,rhonchi or crepitation. No use of accessory muscles of respiration.  CARDIOVASCULAR: S1, S2 normal. No murmurs, rubs, or gallops.  ABDOMEN: Soft, nontender,  nondistended. Bowel sounds present. No organomegaly or mass.  EXTREMITIES: No pedal edema, cyanosis, or clubbing.  NEUROLOGIC: Cranial nerves II through XII are intact. Muscle strength 5/5 in all extremities. Sensation intact. Gait not checked.  PSYCHIATRIC: The patient is alert and oriented x 3.  SKIN: No obvious rash, lesion, or ulcer.    LABORATORY PANEL:  Male CBC Recent Labs  Lab 03/13/19 0446  WBC 5.4  HGB 10.3*  HCT 31.6*  PLT PLATELET CLUMPS NOTED ON SMEAR, UNABLE TO ESTIMATE   ------------------------------------------------------------------------------------------------------------------ Chemistries  Recent Labs  Lab 03/11/19 1302  03/13/19 0446  NA 133*   < > 138  K 4.6   < > 3.6  CL 91*   < > 105  CO2 27   < > 23  GLUCOSE 115*   < > 111*  BUN 42*   < > 21  CREATININE 2.06*   < > 1.20  CALCIUM 10.0   < > 9.2  MG  --    < > 2.6*  AST 18  --   --   ALT 12  --   --   ALKPHOS 55  --   --   BILITOT 0.9  --   --    < > = values in this interval not displayed.   RADIOLOGY:  No results found. ASSESSMENT AND PLAN:   1. Acute kidney injury superimposed on chronic kidney disease stage II due to poor oral intake and diuretics. Renal function continues to improve with IV fluid hydration. Metformin and diuretics on hold.  Follow-up on BMP in a.m. with ongoing IV fluid hydration.  2. Orthostatic hypotension.  IV fluid support.  Hold hypertension medication and diuretics.  3.Hyponatremia.  Likely due to dehydration from diuretics.  Resolved with IV fluid hydration with sodium level improving from 133-138.  Diuretics on hold.    4.Chronic A. fib and CAD.  Continue aspirin, Lipitor and Eliquis pharmacy to dose.  5. Diabetes.  Start sliding scale.  Hold metformin and Jardiance. Blood sugars controlled  6. Weight loss and failure to thrive.  Dietitian consult. Generalized weakness.  Patient seen by physical therapist.  Recommended home health with physical therapy  on discharge.    DVT prophylaxis; patient on Eliquis  All the records are reviewed and case discussed with Care Management/Social Worker. Management plans discussed with the patient, family and they are in agreement.  CODE STATUS: DNR  TOTAL TIME TAKING CARE OF THIS PATIENT: 34 minutes.   More than 50% of the time was spent in counseling/coordination of care: YES  POSSIBLE D/C IN 1DAY, DEPENDING ON CLINICAL CONDITION.   Yuliet Needs M.D on 03/13/2019 at 12:49 PM  Between 7am to 6pm - Pager - [phone]  After 6pm go to [email or web address] - Proofreader  [facility]  Office  [phone]  CC: Primary care physician; [facility], [facility]  Note: This dictation was prepared with Dragon dictation along with smaller phrase technology. Any transcriptional errors that result from this process are unintentional.

## 2019-03-14 LAB — BASIC METABOLIC PANEL
Anion gap: 5 (ref 5–15)
BUN: 17 mg/dL (ref 8–23)
CO2: 24 mmol/L (ref 22–32)
Calcium: 9.2 mg/dL (ref 8.9–10.3)
Chloride: 109 mmol/L (ref 98–111)
Creatinine, Ser: 0.94 mg/dL (ref 0.61–1.24)
GFR calc Af Amer: >60 mL/min (ref >60)
GFR calc non Af Amer: >60 mL/min (ref >60)
Glucose, Bld: 130 mg/dL — ABNORMAL HIGH (ref 70–99)
Potassium: 3.8 mmol/L (ref 3.5–5.1)
Sodium: 138 mmol/L (ref 135–145)

## 2019-03-14 LAB — GLUCOSE, CAPILLARY
Glucose-Capillary: 137 mg/dL — ABNORMAL HIGH (ref 70–99)
Glucose-Capillary: 155 mg/dL — ABNORMAL HIGH (ref 70–99)

## 2019-03-14 MED ORDER — ENSURE MAX PROTEIN PO LIQD: 11.0000 [oz_av] | Freq: Two times a day (BID) | ORAL | 0 refills | Status: AC

## 2019-03-14 NOTE — Care Management Important Message (Signed)
Important Message  Patient Details  Name: Jason Montes MRN: [id number] Date of Birth: [birth year]   Medicare Important Message Given:  Yes     Dannette Barbara 03/14/2019, 10:55 AM

## 2019-03-14 NOTE — Discharge Summary (Signed)
[facility] [place] NAME: Jason Montes    MR#:  [id number]  DATE OF BIRTH:  Oct 23, 1950  DATE OF ADMISSION:  03/11/2019   ADMITTING PHYSICIAN: Demetrios Loll, MD  DATE OF DISCHARGE: 03/14/2019  PRIMARY CARE PHYSICIAN: [facility]   ADMISSION DIAGNOSIS:  Dehydration [E86.0] Hypotension, unspecified hypotension type [I95.9] DISCHARGE DIAGNOSIS:  Active Problems:   ARF (acute renal failure) (HCC)   Malnutrition of moderate degree  SECONDARY DIAGNOSIS:   Past Medical History:  Diagnosis Date   Allergy    Atrial fibrillation (HCC)    B12 deficiency    Chronic kidney disease    Chronic kidney disease (CKD), stage II (mild)    Coronary atherosclerosis    Diabetes mellitus without complication (HCC)    Elevated PSA    Esophagitis    GERD (gastroesophageal reflux disease)    Heart attack ([place])    Heart disease    Hyperlipidemia    Hypertension    Hypokalemia    Mild neurocognitive disorder    Osteoarthritis    Presbyopia    PVD (peripheral vascular disease) ([place])    Restless leg syndrome    Stroke (cerebrum) (HCC)    Stroke (HCC)    Subacute osteomyelitis ([place])    Tinea unguium    Uncompensated short term memory deficit    HOSPITAL COURSE:  Chief complaint; failure to thrive  History of presenting complaint; Jason Montes  is a 68 y.o. male with a known history of multiple medical problems who presented to the emergency room with complaints of feeling lightheaded and difficulty walking.  Patient with decreased p.o. intake.  Patient went to [facility] clinic and was found profound orthostatic hypotension.  He denies any nausea, vomiting or diarrhea.  He has been taking many hypertension medication and diuretics.  Patient found to have evidence of acute kidney injury.  Started on IV fluids and admitted to medical service.  Hospital course; 1. Acute kidney injury superimposed on chronic kidney  disease stage IIdue to poor oral intakeand diuretics.  Renal function back to normal with IV fluid hydration.  Gradually resuming home regimen including torsemide about holding off on spironolactone. 2. Orthostatic hypotension. Resolved with IV fluid hydration.  Patient ambulating well with no dizziness or lightheadedness.  Was on multiple blood pressure medications which were initially placed on hold.  Gradually resuming.  Hold off on resuming isosorbide mononitrate as well as spironolactone.  Follow-up with primary care physician for monitoring and adjustment of blood pressure meds as needed.  3.Hyponatremia.  Likely due to dehydration from diuretics. Resolved  4.Chronic A. fib and CAD. Continue aspirin,Lipitor and Eliquis 5. Diabetes.  Resuming home regimen including Metformin since acute kidney injury resolved.   6. Weight loss and failure to thrive.  Patient eating much more now.  Generalized weakness improved with IV fluid hydration.  Patient seen by physical therapist.  Recommended home health with physical therapy on discharge.  Prednisone as well.  Follow-up with primary care physician   DISCHARGE CONDITIONS:  Stable CONSULTS OBTAINED:   DRUG ALLERGIES:   Allergies  Allergen Reactions   Norvasc [Amlodipine Besylate]    Penicillins     Childhood allergy, not sure what happens    DISCHARGE MEDICATIONS:   Allergies as of 03/14/2019      Reactions   Norvasc [amlodipine Besylate]    Penicillins    Childhood allergy, not sure what happens       Medication List  STOP taking these medications   isosorbide mononitrate 30 MG 24 hr tablet Commonly known as: IMDUR   spironolactone 25 MG tablet Commonly known as: ALDACTONE     TAKE these medications   aspirin EC 81 MG tablet Take 81 mg by mouth daily.   atorvastatin 40 MG tablet Commonly known as: LIPITOR Take 40 mg by mouth at bedtime.   betamethasone valerate 0.1 % cream Commonly known as: VALISONE Apply  1 application topically 2 (two) times daily as needed.   carvedilol 3.125 MG tablet Commonly known as: COREG Take 3.125 mg by mouth 2 (two) times daily with a meal.   cetirizine 10 MG tablet Commonly known as: ZYRTEC Take 10 mg by mouth daily.   cyanocobalamin 1000 MCG/ML injection Commonly known as: (VITAMIN B-12) Inject 1,000 mcg into the muscle every 30 (thirty) days.   cyclobenzaprine 10 MG tablet Commonly known as: FLEXERIL Take 10 mg by mouth 2 (two) times daily as needed for muscle spasms.   docusate sodium 50 MG capsule Commonly known as: COLACE Take 100 mg by mouth daily.   Eliquis 5 MG Tabs tablet Generic drug: apixaban Take 5 mg by mouth 2 (two) times daily.   empagliflozin 25 MG Tabs tablet Commonly known as: JARDIANCE Take 12.5 mg by mouth daily.   Ensure Max Protein Liqd Take 330 mLs (11 oz total) by mouth 2 (two) times daily between meals.   famotidine 20 MG tablet Commonly known as: PEPCID Take 20 mg by mouth 2 (two) times daily.   fluticasone 50 MCG/ACT nasal spray Commonly known as: FLONASE Place 2 sprays into both nostrils daily.   gabapentin 600 MG tablet Commonly known as: NEURONTIN Take 600 mg by mouth 2 (two) times daily.   glucose 4 GM chewable tablet Chew 4 tablets by mouth as needed for low blood sugar.   lisinopril 2.5 MG tablet Commonly known as: ZESTRIL Take 2.5 mg by mouth daily.   metFORMIN 500 MG tablet Commonly known as: GLUCOPHAGE Start 1 tab by mouth in am daily for 7 days. Then increase to 1 tab in am & pm for 7 days.  Continue weekly increase to 2 tabs twice daily. What changed:   how much to take  how to take this  when to take this  additional instructions   nitroGLYCERIN 0.4 MG SL tablet Commonly known as: NITROSTAT Place 0.4 mg under the tongue every 5 (five) minutes as needed for chest pain.   Semaglutide (1 MG/DOSE) 2 MG/1.5ML Sopn Inject 1.5 mg into the skin once a week.   tamsulosin 0.4 MG Caps  capsule Commonly known as: FLOMAX Take 0.4 mg by mouth daily.   torsemide 20 MG tablet Commonly known as: DEMADEX Take 20 mg by mouth daily.   Vitamin D3 25 MCG (1000 UT) Caps Take 1,000 Units by mouth daily.        DISCHARGE INSTRUCTIONS:   DIET:  Diabetic diet DISCHARGE CONDITION:  Stable ACTIVITY:  Activity as tolerated OXYGEN:  Home Oxygen: No.  Oxygen Delivery: room air DISCHARGE LOCATION:  home   If you experience worsening of your admission symptoms, develop shortness of breath, life threatening emergency, suicidal or homicidal thoughts you must seek medical attention immediately by calling 911 or calling your MD immediately  if symptoms less severe.  You Must read complete instructions/literature along with all the possible adverse reactions/side effects for all the Medicines you take and that have been prescribed to you. Take any new Medicines after you have completely  understood and accpet all the possible adverse reactions/side effects.   Please note  You were cared for by a hospitalist during your hospital stay. If you have any questions about your discharge medications or the care you received while you were in the hospital after you are discharged, you can call the unit and asked to speak with the hospitalist on call if the hospitalist that took care of you is not available. Once you are discharged, your primary care physician will handle any further medical issues. Please note that NO REFILLS for any discharge medications will be authorized once you are discharged, as it is imperative that you return to your primary care physician (or establish a relationship with a primary care physician if you do not have one) for your aftercare needs so that they can reassess your need for medications and monitor your lab values.    On the day of Discharge:  VITAL SIGNS:  Blood pressure 122/65, pulse 80, temperature 98.6 F (37 C), temperature source Oral, resp. rate 16,  height 5\' 9"  (1.753 m), weight 74.7 kg, SpO2 98 %. PHYSICAL EXAMINATION:  GENERAL:  68 y.o.-year-old patient lying in the bed with no acute distress.  EYES: Pupils equal, round, reactive to light and accommodation. No scleral icterus. Extraocular muscles intact.  HEENT: Head atraumatic, normocephalic. Oropharynx and nasopharynx clear.  NECK:  Supple, no jugular venous distention. No thyroid enlargement, no tenderness.  LUNGS: Normal breath sounds bilaterally, no wheezing, rales,rhonchi or crepitation. No use of accessory muscles of respiration.  CARDIOVASCULAR: S1, S2 normal. No murmurs, rubs, or gallops.  ABDOMEN: Soft, non-tender, non-distended. Bowel sounds present. No organomegaly or mass.  EXTREMITIES: No pedal edema, cyanosis, or clubbing.  NEUROLOGIC: Cranial nerves II through XII are intact. Muscle strength 5/5 in all extremities. Sensation intact. Gait not checked.  PSYCHIATRIC: The patient is alert and oriented x 3.  SKIN: No obvious rash, lesion, or ulcer.  DATA REVIEW:   CBC Recent Labs  Lab 03/13/19 0446  WBC 5.4  HGB 10.3*  HCT 31.6*  PLT PLATELET CLUMPS NOTED ON SMEAR, UNABLE TO ESTIMATE    Chemistries  Recent Labs  Lab 03/11/19 1302  03/13/19 0446 03/14/19 0528  NA 133*   < > 138 138  K 4.6   < > 3.6 3.8  CL 91*   < > 105 109  CO2 27   < > 23 24  GLUCOSE 115*   < > 111* 130*  BUN 42*   < > 21 17  CREATININE 2.06*   < > 1.20 0.94  CALCIUM 10.0   < > 9.2 9.2  MG  --    < > 2.6*  --   AST 18  --   --   --   ALT 12  --   --   --   ALKPHOS 55  --   --   --   BILITOT 0.9  --   --   --    < > = values in this interval not displayed.     Microbiology Results  Results for orders placed or performed during the hospital encounter of 03/11/19  SARS CORONAVIRUS 2 (TAT 6-24 HRS) Nasopharyngeal Nasopharyngeal Swab     Status: None   Collection Time: 03/11/19  2:15 PM   Specimen: Nasopharyngeal Swab  Result Value Ref Range Status   SARS Coronavirus 2 NEGATIVE  NEGATIVE Final    Comment: (NOTE) SARS-CoV-2 target nucleic acids are NOT DETECTED. The SARS-CoV-2 RNA is generally  detectable in upper and lower respiratory specimens during the acute phase of infection. Negative results do not preclude SARS-CoV-2 infection, do not rule out co-infections with other pathogens, and should not be used as the sole basis for treatment or other patient management decisions. Negative results must be combined with clinical observations, patient history, and epidemiological information. The expected result is Negative. Fact Sheet for Patients: SugarRoll.be Fact Sheet for Healthcare Providers: https://www.woods-mathews.com/ This test is not yet approved or cleared by the [place] FDA and  has been authorized for detection and/or diagnosis of SARS-CoV-2 by FDA under an Emergency Use Authorization (EUA). This EUA will remain  in effect (meaning this test can be used) for the duration of the COVID-19 declaration under Section 56 4(b)(1) of the Act, 21 U.S.C. section 360bbb-3(b)(1), unless the authorization is terminated or revoked sooner. Performed at [facility], [place] [address], [place], [place] [address]     RADIOLOGY:  No results found.   Management plans discussed with the patient, family and they are in agreement.  CODE STATUS: DNR   TOTAL TIME TAKING CARE OF THIS PATIENT: 37 minutes.    Tovia Kisner M.D on 03/14/2019 at 12:12 PM  Between 7am to 6pm - Pager - [phone]  After 6pm go to [email or web address] - Proofreader  [facility]  Office  [phone]  CC: Primary care physician; [facility]   Note: This dictation was prepared with Dragon dictation along with smaller phrase technology. Any transcriptional errors that result from this process are unintentional.

## 2019-03-14 NOTE — TOC Initial Note (Signed)
Transition of Care [facility]) - Initial/Assessment Note    Patient Details  Name: Jason Montes MRN: [id number] Date of Birth: 06-Jul-1950  [facility] CM/SW Contact:    Shelbie Hutching, RN Phone Number: 03/14/2019, 9:27 AM  Clinical Narrative:                 Patient admitted with acute renal failure due to dehydration.  Patient is doing much better and medically cleared for discharge.  Patient is from home where he lives with his daughter and her husband.  Patient goes to the [facility].  Patient's daughter, Nira Conn, provides transportation.  Patient gets prescriptions from the [place], they are mailed to his home.  Patient reports that he has someone come to the home everyday that helps with meals and laundry, provided by the [place].  Patient also reports that he has had home health in the past but cannot remember the agency.  Patient needs home health RN and PT at discharge- Sharmon Revere with [facility] given referral.  Patient's daughter Nira Conn will pick him up at discharge.   Expected Discharge Plan: [place] Barriers to Discharge: No Barriers Identified   Patient Goals and CMS Choice   CMS Medicare.gov Compare Post Acute Care list provided to:: Patient Choice offered to / list presented to : Patient  Expected Discharge Plan and Services Expected Discharge Plan: [place]   Discharge Planning Services: CM Consult Post Acute Care Choice: [place] arrangements for the past 2 months: Single Family Home                           HH Arranged: RN, PT [facility] Agency: [place] Date [place]: 03/14/19 Time HH Agency Contacted: [phone] Representative spoke with at [place]: Sharmon Revere  Prior Living Arrangements/Services Living arrangements for the past 2 months: [place] with:: Adult Children(daughter and her husband) Patient language and need for interpreter reviewed:: No Do you  feel safe going back to the place where you live?: Yes      Need for Family Participation in Patient Care: Yes (Comment) Care giver support system in place?: Yes (comment) Current home services: (walker) Criminal Activity/Legal Involvement Pertinent to Current Situation/Hospitalization: No - Comment as needed  Activities of Daily Living Home Assistive Devices/Equipment: Cane (specify quad or straight), Walker (specify type) ADL Screening (condition at time of admission) Patient's cognitive ability adequate to safely complete daily activities?: Yes Is the patient deaf or have difficulty hearing?: No Does the patient have difficulty seeing, even when wearing glasses/contacts?: No Does the patient have difficulty concentrating, remembering, or making decisions?: No Patient able to express need for assistance with ADLs?: Yes Does the patient have difficulty dressing or bathing?: No Independently performs ADLs?: Yes (appropriate for developmental age) Does the patient have difficulty walking or climbing stairs?: Yes Weakness of Legs: Both Weakness of Arms/Hands: None  Permission Sought/Granted Permission sought to share information with : Case Manager, Other (comment) Permission granted to share information with : Yes, Verbal Permission Granted     Permission granted to share info w AGENCY: [facility]        Emotional Assessment Appearance:: Appears older than stated age Attitude/Demeanor/Rapport: Engaged Affect (typically observed): Accepting Orientation: : Oriented to Self, Oriented to Place, Oriented to  Time, Oriented to Situation Alcohol / Substance Use: Not Applicable Psych Involvement: No (comment)  Admission diagnosis:  Dehydration [  E86.0] Hypotension, unspecified hypotension type [I95.9] Patient Active Problem List   Diagnosis Date Noted  . Malnutrition of moderate degree 03/13/2019  . ARF (acute renal failure) ([place]) 03/11/2019  . Family history of malignant neoplasm of  gastrointestinal tract   . Benign neoplasm of transverse colon   . Benign neoplasm of cecum   . Benign neoplasm of ascending colon   . Benign neoplasm of descending colon   . Polyp of sigmoid colon   . Atrial fibrillation ([place]) 02/25/2018  . Vitamin B12 deficiency anemia 02/25/2018  . CKD (chronic kidney disease) stage 2, GFR 60-89 ml/min 02/25/2018  . CAD (coronary artery disease) 02/25/2018  . DM (diabetes mellitus), type 2, uncontrolled with complications ([place]) [id number]  . GERD with esophagitis 02/25/2018  . Generalized muscle weakness 02/25/2018  . Chronic diastolic heart failure ([place]) 02/25/2018  . Chronic anticoagulation 02/25/2018  . 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 Hyperlipidemia 02/25/2018  . Essential hypertension 02/25/2018  . Hypokalemia 02/25/2018  . Mild neurocognitive disorder 02/25/2018  . Osteoarthritis 02/25/2018  . PVD (peripheral vascular disease) ([place]) 02/25/2018  . Presbyopia 02/25/2018  . Restless leg syndrome 02/25/2018  . History of stroke with current residual effects 02/25/2018  . Subacute osteomyelitis of right foot ([place]) 02/25/2018  . Tinea unguium 02/25/2018   PCP:  Clinic, [place]:   [id number] - [place], [address] [place] [address] Phone: [phone] Fax: [place], [place] - [place] [phone] [place] [address] Phone: [phone] Fax: [phone]     Social Determinants of Health (SDOH) Interventions    Readmission Risk Interventions No flowsheet data found.

## 2019-04-08 ENCOUNTER — Other Ambulatory Visit: Payer: Self-pay

## 2019-04-08 ENCOUNTER — Emergency Department: Payer: No Typology Code available for payment source

## 2019-04-08 ENCOUNTER — Emergency Department
Admission: EM | Admit: 2019-04-08 | Discharge: 2019-04-08 | Disposition: A | Discharge status: Home / Self Care | Payer: No Typology Code available for payment source | Attending: Emergency Medicine | Admitting: Emergency Medicine

## 2019-04-08 DIAGNOSIS — R197 Diarrhea, unspecified: Secondary | ICD-10-CM | POA: Insufficient documentation

## 2019-04-08 DIAGNOSIS — I2510 Atherosclerotic heart disease of native coronary artery without angina pectoris: Secondary | ICD-10-CM | POA: Insufficient documentation

## 2019-04-08 DIAGNOSIS — I129 Hypertensive chronic kidney disease with stage 1 through stage 4 chronic kidney disease, or unspecified chronic kidney disease: Secondary | ICD-10-CM | POA: Diagnosis not present

## 2019-04-08 DIAGNOSIS — R627 Adult failure to thrive: Secondary | ICD-10-CM | POA: Diagnosis present

## 2019-04-08 DIAGNOSIS — N182 Chronic kidney disease, stage 2 (mild): Secondary | ICD-10-CM | POA: Insufficient documentation

## 2019-04-08 DIAGNOSIS — Z7982 Long term (current) use of aspirin: Secondary | ICD-10-CM | POA: Insufficient documentation

## 2019-04-08 DIAGNOSIS — R110 Nausea: Secondary | ICD-10-CM

## 2019-04-08 DIAGNOSIS — I4891 Unspecified atrial fibrillation: Secondary | ICD-10-CM | POA: Diagnosis not present

## 2019-04-08 DIAGNOSIS — Z79899 Other long term (current) drug therapy: Secondary | ICD-10-CM | POA: Diagnosis not present

## 2019-04-08 DIAGNOSIS — Z7901 Long term (current) use of anticoagulants: Secondary | ICD-10-CM | POA: Diagnosis not present

## 2019-04-08 DIAGNOSIS — Z7984 Long term (current) use of oral hypoglycemic drugs: Secondary | ICD-10-CM | POA: Diagnosis not present

## 2019-04-08 DIAGNOSIS — E1122 Type 2 diabetes mellitus with diabetic chronic kidney disease: Secondary | ICD-10-CM | POA: Insufficient documentation

## 2019-04-08 DIAGNOSIS — Z950 Presence of cardiac pacemaker: Secondary | ICD-10-CM | POA: Insufficient documentation

## 2019-04-08 LAB — CBC
HCT: 34.8 % — ABNORMAL LOW (ref 39.0–52.0)
Hemoglobin: 11.2 g/dL — ABNORMAL LOW (ref 13.0–17.0)
MCH: 29.4 pg (ref 26.0–34.0)
MCHC: 32.2 g/dL (ref 30.0–36.0)
MCV: 91.3 fL (ref 80.0–100.0)
Platelets: 194 10*3/uL (ref 150–400)
RBC: 3.81 MIL/uL — ABNORMAL LOW (ref 4.22–5.81)
RDW: 14.4 % (ref 11.5–15.5)
WBC: 6.1 10*3/uL (ref 4.0–10.5)
nRBC: 0 % (ref 0.0–0.2)

## 2019-04-08 LAB — URINALYSIS, COMPLETE (UACMP) WITH MICROSCOPIC
Bacteria, UA: NONE SEEN
Bilirubin Urine: NEGATIVE
Glucose, UA: >=500 mg/dL — AB
Hgb urine dipstick: NEGATIVE
Ketones, ur: NEGATIVE mg/dL
Leukocytes,Ua: NEGATIVE
Nitrite: NEGATIVE
Protein, ur: NEGATIVE mg/dL
Specific Gravity, Urine: 1.008 (ref 1.005–1.030)
Squamous Epithelial / HPF: NONE SEEN (ref 0–5)
WBC, UA: NONE SEEN WBC/hpf (ref 0–5)
pH: 6 (ref 5.0–8.0)

## 2019-04-08 LAB — COMPREHENSIVE METABOLIC PANEL
ALT: 14 U/L (ref 0–44)
AST: 21 U/L (ref 15–41)
Albumin: 4.3 g/dL (ref 3.5–5.0)
Alkaline Phosphatase: 62 U/L (ref 38–126)
Anion gap: 12 (ref 5–15)
BUN: 28 mg/dL — ABNORMAL HIGH (ref 8–23)
CO2: 27 mmol/L (ref 22–32)
Calcium: 9.5 mg/dL (ref 8.9–10.3)
Chloride: 93 mmol/L — ABNORMAL LOW (ref 98–111)
Creatinine, Ser: 1.3 mg/dL — ABNORMAL HIGH (ref 0.61–1.24)
GFR calc Af Amer: >60 mL/min (ref >60)
GFR calc non Af Amer: 57 mL/min — ABNORMAL LOW (ref >60)
Glucose, Bld: 106 mg/dL — ABNORMAL HIGH (ref 70–99)
Potassium: 4.5 mmol/L (ref 3.5–5.1)
Sodium: 132 mmol/L — ABNORMAL LOW (ref 135–145)
Total Bilirubin: 1 mg/dL (ref 0.3–1.2)
Total Protein: 8.9 g/dL — ABNORMAL HIGH (ref 6.5–8.1)

## 2019-04-08 LAB — LIPASE, BLOOD: Lipase: 24 U/L (ref 11–51)

## 2019-04-08 LAB — TROPONIN I (HIGH SENSITIVITY)
Troponin I (High Sensitivity): 43 ng/L — ABNORMAL HIGH (ref <18)
Troponin I (High Sensitivity): 44 ng/L — ABNORMAL HIGH (ref <18)

## 2019-04-08 LAB — GLUCOSE, CAPILLARY: Glucose-Capillary: 106 mg/dL — ABNORMAL HIGH (ref 70–99)

## 2019-04-08 MED ORDER — ONDANSETRON 4 MG PO TBDP: 4.0000 mg | ORAL_TABLET | Freq: Three times a day (TID) | ORAL | 0 refills | Status: DC | PRN

## 2019-04-08 MED ORDER — ONDANSETRON HCL 4 MG/2ML IJ SOLN
4.0000 mg | Freq: Once | INTRAMUSCULAR | Status: AC
  Administered 2019-04-08: 4 mg via INTRAVENOUS
  Filled 2019-04-08: qty 2

## 2019-04-08 MED ORDER — SODIUM CHLORIDE 0.9 % IV BOLUS
1000.0000 mL | Freq: Once | INTRAVENOUS | Status: AC
  Administered 2019-04-08: 1000 mL via INTRAVENOUS

## 2019-04-08 MED ORDER — IOHEXOL 300 MG/ML  SOLN
100.0000 mL | Freq: Once | INTRAMUSCULAR | Status: AC | PRN
  Administered 2019-04-08: 100 mL via INTRAVENOUS

## 2019-04-08 MED ORDER — IOHEXOL 9 MG/ML PO SOLN
1000.0000 mL | Freq: Once | ORAL | Status: AC | PRN
  Administered 2019-04-08: 1000 mL via ORAL

## 2019-04-08 MED ORDER — OXYCODONE-ACETAMINOPHEN 5-325 MG PO TABS: 1.0000 | ORAL_TABLET | ORAL | 0 refills | Status: DC | PRN

## 2019-04-08 MED ORDER — MORPHINE SULFATE (PF) 2 MG/ML IV SOLN
2.0000 mg | Freq: Once | INTRAVENOUS | Status: AC
  Administered 2019-04-08: 2 mg via INTRAVENOUS
  Filled 2019-04-08: qty 1

## 2019-04-08 NOTE — ED Triage Notes (Signed)
Pt here with son in law, alert and oriented X 4. Pt reports decreased appetite x a few weeks. NVD since flu shot X 1 week ago. recently dx with failure to thrive. Pt states that he doesn't feel hungry. Denies pain. RR even and unlabored, color WNL.

## 2019-04-08 NOTE — ED Notes (Signed)
Patient transported to CT 

## 2019-04-08 NOTE — ED Provider Notes (Addendum)
[facility] Emergency Department Provider Note   ____________________________________________   First MD Initiated Contact with Patient 04/08/19 1318     (approximate)  I have reviewed the triage vital signs and the nursing notes.   HISTORY  Chief Complaint Failure To Thrive and Diarrhea    HPI Jason Montes is a 68 y.o. male patient reportedly has had a decreased appetite for the last few weeks.  He is able to eat just a very small amount and then feels full.  Is also had some nausea and epigastric pain.  He has had diarrhea since the flu shot that he got about a week ago.  Epigastric pain has not improved with eating is not made worse with eating either.  It is made worse with palpation.  It is moderate.         Past Medical History:  Diagnosis Date   Allergy    Atrial fibrillation ([place])    B12 deficiency    Chronic kidney disease    Chronic kidney disease (CKD), stage II (mild)    Coronary atherosclerosis    Diabetes mellitus without complication (HCC)    Elevated PSA    Esophagitis    GERD (gastroesophageal reflux disease)    Heart attack ([place])    Heart disease    Hyperlipidemia    Hypertension    Hypokalemia    Mild neurocognitive disorder    Osteoarthritis    Presbyopia    PVD (peripheral vascular disease) (HCC)    Restless leg syndrome    Stroke (cerebrum) (HCC)    Stroke (HCC)    Subacute osteomyelitis (HCC)    Tinea unguium    Uncompensated short term memory deficit     Patient Active Problem List   Diagnosis Date Noted   Malnutrition of moderate degree 03/13/2019   ARF (acute renal failure) ([place]) 03/11/2019   Family history of malignant neoplasm of gastrointestinal tract    Benign neoplasm of transverse colon    Benign neoplasm of cecum    Benign neoplasm of ascending colon    Benign neoplasm of descending colon    Polyp of sigmoid colon    Atrial fibrillation ([place]) 02/25/2018     Vitamin B12 deficiency anemia 02/25/2018   CKD (chronic kidney disease) stage 2, GFR 60-89 ml/min 02/25/2018   CAD (coronary artery disease) 02/25/2018   DM (diabetes mellitus), type 2, uncontrolled with complications ([place]) [id number]   GERD with esophagitis 02/25/2018   Generalized muscle weakness 02/25/2018   Chronic diastolic heart failure ([place]) 02/25/2018   Chronic anticoagulation 02/25/2018   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/25/2018   Hyperlipidemia 02/25/2018   Essential hypertension 02/25/2018   Hypokalemia 02/25/2018   Mild neurocognitive disorder 02/25/2018   Osteoarthritis 02/25/2018   PVD (peripheral vascular disease) ([place]) 02/25/2018   Presbyopia 02/25/2018   Restless leg syndrome 02/25/2018   History of stroke with current residual effects 02/25/2018   Subacute osteomyelitis of right foot ([place]) 02/25/2018   Tinea unguium 02/25/2018    Past Surgical History:  Procedure Laterality Date   CARDIAC PACEMAKER PLACEMENT     COLONOSCOPY WITH PROPOFOL N/A 05/18/2018   Procedure: COLONOSCOPY WITH PROPOFOL;  Surgeon: [name], MD;  Location: [facility] ENDOSCOPY;  Service: Endoscopy;  Laterality: N/A;   CORONARY ARTERY BYPASS GRAFT     FOOT AMPUTATION Right     Prior to Admission medications   Medication Sig Start Date End Date Taking? Authorizing Provider  apixaban (ELIQUIS) 5 MG TABS tablet Take 5  mg by mouth 2 (two) times daily.    [provider]  aspirin EC 81 MG tablet Take 81 mg by mouth daily.    [provider]  atorvastatin (LIPITOR) 40 MG tablet Take 40 mg by mouth at bedtime.     [provider]  betamethasone valerate (VALISONE) 0.1 % cream Apply 1 application topically 2 (two) times daily as needed.    [provider]  carvedilol (COREG) 3.125 MG tablet Take 3.125 mg by mouth 2 (two) times daily with a meal.    [provider]  cetirizine (ZYRTEC) 10 MG tablet Take 10 mg by mouth daily.     [provider]  Cholecalciferol (VITAMIN D3) 1000 units CAPS Take 1,000 Units by mouth daily.     [provider]  cyanocobalamin (,VITAMIN B-12,) 1000 MCG/ML injection Inject 1,000 mcg into the muscle every 30 (thirty) days.    [provider]  cyclobenzaprine (FLEXERIL) 10 MG tablet Take 10 mg by mouth 2 (two) times daily as needed for muscle spasms.    [provider]  docusate sodium (COLACE) 50 MG capsule Take 100 mg by mouth daily.     [provider]  empagliflozin (JARDIANCE) 25 MG TABS tablet Take 12.5 mg by mouth daily.    [provider]  Ensure Max Protein (ENSURE MAX PROTEIN) LIQD Take 330 mLs (11 oz total) by mouth 2 (two) times daily between meals. 03/14/19 04/13/19  Stark Jock Jude, MD  famotidine (PEPCID) 20 MG tablet Take 20 mg by mouth 2 (two) times daily.    [provider]  fluticasone (FLONASE) 50 MCG/ACT nasal spray Place 2 sprays into both nostrils daily.     [provider]  gabapentin (NEURONTIN) 600 MG tablet Take 600 mg by mouth 2 (two) times daily.     [provider]  glucose 4 GM chewable tablet Chew 4 tablets by mouth as needed for low blood sugar.    [provider]  lisinopril (ZESTRIL) 2.5 MG tablet Take 2.5 mg by mouth daily.    [provider]  metFORMIN (GLUCOPHAGE) 500 MG tablet Start 1 tab by mouth in am daily for 7 days. Then increase to 1 tab in am & pm for 7 days.  Continue weekly increase to 2 tabs twice daily. Patient taking differently: Take 500 mg by mouth 2 (two) times daily.  02/24/18   Mikey College, NP  nitroGLYCERIN (NITROSTAT) 0.4 MG SL tablet Place 0.4 mg under the tongue every 5 (five) minutes as needed for chest pain.    [provider]  Semaglutide, 1 MG/DOSE, 2 MG/1.5ML SOPN Inject 1.5 mg into the skin once a week.    [provider]  tamsulosin (FLOMAX) 0.4 MG CAPS capsule Take 0.4 mg by mouth daily.     [provider]  torsemide (DEMADEX) 20 MG tablet Take 20 mg by mouth daily.     [provider]    Allergies Norvasc [amlodipine besylate] and Penicillins  Family History  Problem Relation Age of Onset   Heart failure Mother    Colon cancer Mother    Diabetes Mother    Thyroid disease Sister     Social History Social History   Tobacco Use   Smoking status: Never Smoker   Smokeless tobacco: Never Used  Substance Use Topics   Alcohol use: Never    Frequency: Never   Drug use: Never    Review of Systems  Constitutional: No fever/chills Eyes:  No visual changes. ENT: No sore throat. Cardiovascular: Denies chest pain. Respiratory: Denies shortness of breath. Gastrointestinal: abdominal pain.  nausea, no vomiting.  diarrhea.  Past history of constipation. Genitourinary: Negative for dysuria. Musculoskeletal: Negative for back pain. Skin: Negative for rash. Neurological: Negative for headaches, focal weakness or numbness.   ____________________________________________   PHYSICAL EXAM:  VITAL SIGNS: ED Triage Vitals  Enc Vitals Group     BP 04/08/19 1034 130/66     Pulse Rate 04/08/19 1034 80     Resp 04/08/19 1034 18     Temp 04/08/19 1034 98.7 F (37.1 C)     Temp Source 04/08/19 1034 Oral     SpO2 04/08/19 1034 97 %     Weight 04/08/19 1037 155 lb (70.3 kg)     Height 04/08/19 1037 5\' 9"  (1.753 m)     Head Circumference --      Peak Flow --      Pain Score 04/08/19 1037 0     Pain Loc --      Pain Edu? --      Excl. in [place]? --    Constitutional: Alert and oriented. Well appearing and in no acute distress. Eyes: Conjunctivae are normal. Head: Atraumatic. Nose: No congestion/rhinnorhea. Mouth/Throat: Mucous membranes are moist.  Oropharynx non-erythematous. Neck: No stridor.  Cardiovascular: Normal rate, regular rhythm. Grossly normal heart sounds.  Good peripheral circulation. Respiratory: Normal respiratory effort.  No retractions.  Lungs CTAB. Gastrointestinal: Soft nontender to palpation except for in the epigastric area where there is some tenderness.  No distention. No abdominal bruits. No CVA tenderness. Musculoskeletal: No lower extremity tenderness nor edema.  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 No gross focal neurologic deficits are appreciated.  Skin:  Skin is warm, dry and intact. No rash noted.   ____________________________________________   LABS (all labs ordered are listed, but only abnormal results are displayed)  Labs Reviewed  COMPREHENSIVE METABOLIC PANEL - Abnormal; Notable for the following components:      Result Value   Sodium 132 (*)    Chloride 93 (*)    Glucose, Bld 106 (*)    BUN 28 (*)    Creatinine, Ser 1.30 (*)    Total Protein 8.9 (*)    GFR calc non Af Amer 57 (*)    All other components within normal limits  URINALYSIS, COMPLETE (UACMP) WITH MICROSCOPIC - Abnormal; Notable for the following components:   Color, Urine STRAW (*)    APPearance CLEAR (*)    Glucose, UA >=500 (*)    All other components within normal limits  GLUCOSE, CAPILLARY - Abnormal; Notable for the following components:   Glucose-Capillary 106 (*)    All other components within normal limits  CBC - Abnormal; Notable for the following components:   RBC 3.81 (*)    Hemoglobin 11.2 (*)    HCT 34.8 (*)    All other components within normal limits  TROPONIN I (HIGH SENSITIVITY) - Abnormal; Notable for the following components:   Troponin I (High Sensitivity) 43 (*)    All other components within normal limits  TROPONIN I (HIGH SENSITIVITY) - Abnormal; Notable for the following components:   Troponin I (High Sensitivity) 44 (*)    All other components within normal limits  GASTROINTESTINAL PANEL BY PCR, STOOL (REPLACES STOOL CULTURE)  C DIFFICILE QUICK SCREEN W PCR REFLEX  LIPASE, BLOOD  CBG MONITORING, ED    ____________________________________________  EKG  EKG read interpreted by me shows normal sinus rhythm rate of 78 normal axis flipped T's in 1 and L on some of the V leads slightly more prominent than previous EKG ____________________________________________  RADIOLOGY  ED MD interpretation: CT read by radiology reviewed by me did not show anything that would account for his epigastric pain.  Official radiology report(s): Ct Abdomen Pelvis W Contrast  Result Date: 04/08/2019 CLINICAL DATA:  Acute generalized abdominal pain with loss of appetite a few weeks. Nausea, vomiting and diarrhea since flu shot 1 week ago. EXAM: CT ABDOMEN AND PELVIS WITH CONTRAST TECHNIQUE: Multidetector CT imaging of the abdomen and pelvis was performed using the standard protocol following bolus administration of intravenous contrast. CONTRAST:  159mL OMNIPAQUE IOHEXOL 300 MG/ML  SOLN COMPARISON:  06/18/2018 FINDINGS: Lower chest: Cardiac pacer leads are present. Calcified plaque over the visualized coronary arteries. Minimal calcified plaque over the distal descending thoracic aorta. Sternotomy wires are present. Right lung bases clear. Chronic pleural thickening and scarring over the lateral left base. Hepatobiliary: Liver, gallbladder and biliary tree are normal. Pancreas: Normal. Spleen: Normal. Adrenals/Urinary Tract: Adrenal glands are normal. Kidneys are normal in size without hydronephrosis. Punctate nonobstructing stone over the mid to lower pole left kidney. Ureters and bladder are normal. Stomach/Bowel: Stomach and small bowel are normal. Appendix is normal. Mild fecal retention throughout the colon. Vascular/Lymphatic: Mild calcified plaque over the abdominal aorta which is normal in caliber. No evidence of adenopathy. Reproductive: Normal. Other: No free fluid or focal inflammatory change. Small right inguinal hernia containing only peritoneal fat. Musculoskeletal: Degenerative change of the spine and hips.  IMPRESSION: 1.  No acute findings in the abdomen/pelvis. 2.  Chronic scarring/pleural thickening over the lateral left base. 3.  Punctate nonobstructing left renal stone. 4. Aortic Atherosclerosis (ICD10-I70.0). Atherosclerotic coronary artery disease. Electronically Signed   By: Marin Olp M.D.   On: 04/08/2019 14:48    ____________________________________________   PROCEDURES  Procedure(s) performed (including Critical Care):  Procedures   ____________________________________________   INITIAL IMPRESSION / ASSESSMENT AND PLAN / ED COURSE Patient tolerates p.o. liquids i.e. the contrast well.  Pain is currently not there.  I cannot find anything to explain his pain.  Is not had any more diarrhea.  I will let him go continue his current medicines return if he worsens.  We will have him follow-up with his doctor and gastroenterology as well. Patient reports he is taking all of his medication with include Pepcid and Maalox Mylanta.  These do not help the epigastric pain.             ____________________________________________   FINAL CLINICAL IMPRESSION(S) / ED DIAGNOSES  Final diagnoses:  Diarrhea, unspecified type  Nausea     ED Discharge Orders    None       Note:  This document was prepared using Dragon voice recognition software and may include unintentional dictation errors.    Nena Polio, MD 04/08/19 1734    Nena Polio, MD 04/14/19 2209

## 2019-04-08 NOTE — Discharge Instructions (Addendum)
Please continue to use the Pepto-Bismol for the diarrhea.  Please continue to use your Pepcid.  You can also continue the Maalox or Mylanta for the epigastric pain.  I will give you some Zofran melt on your tongue wafers 1 3 times a day to help with the nausea and just a little bit of pain medication if she needed for the epigastric pain.  Please be very careful because the pain medication can make you woozy.  It can also make you constipated which will worsen your lack of appetite.  I would only use them as a last resort.  Please return or follow-up with your doctor if the diarrhea continues Tuesday or Wednesday.  If possible I will have the nurse give you some containers to bring a stool specimen back so we can check it for any infection. You can also try to follow-up with GI again.  It would be a good idea to give them a call Monday if you are still having problems.

## 2019-04-08 NOTE — ED Notes (Signed)
Patient assisted to toilet.  Will continue to monitor.

## 2019-10-07 ENCOUNTER — Other Ambulatory Visit: Payer: Self-pay

## 2019-10-07 ENCOUNTER — Inpatient Hospital Stay
Admission: EM | Admit: 2019-10-07 | Discharge: 2019-10-11 | DRG: 253 | Disposition: A | Discharge status: Home Health | Payer: Medicare Other | Attending: Internal Medicine | Admitting: Internal Medicine

## 2019-10-07 ENCOUNTER — Emergency Department: Payer: Medicare Other

## 2019-10-07 ENCOUNTER — Encounter: Payer: Self-pay | Admitting: Emergency Medicine

## 2019-10-07 DIAGNOSIS — Z800 Family history of malignant neoplasm of digestive organs: Secondary | ICD-10-CM

## 2019-10-07 DIAGNOSIS — Z20822 Contact with and (suspected) exposure to covid-19: Secondary | ICD-10-CM | POA: Diagnosis present

## 2019-10-07 DIAGNOSIS — I129 Hypertensive chronic kidney disease with stage 1 through stage 4 chronic kidney disease, or unspecified chronic kidney disease: Secondary | ICD-10-CM | POA: Diagnosis present

## 2019-10-07 DIAGNOSIS — N400 Enlarged prostate without lower urinary tract symptoms: Secondary | ICD-10-CM | POA: Diagnosis present

## 2019-10-07 DIAGNOSIS — Z8249 Family history of ischemic heart disease and other diseases of the circulatory system: Secondary | ICD-10-CM

## 2019-10-07 DIAGNOSIS — I2510 Atherosclerotic heart disease of native coronary artery without angina pectoris: Secondary | ICD-10-CM | POA: Diagnosis present

## 2019-10-07 DIAGNOSIS — L03116 Cellulitis of left lower limb: Secondary | ICD-10-CM | POA: Diagnosis present

## 2019-10-07 DIAGNOSIS — E785 Hyperlipidemia, unspecified: Secondary | ICD-10-CM | POA: Diagnosis present

## 2019-10-07 DIAGNOSIS — I10 Essential (primary) hypertension: Secondary | ICD-10-CM | POA: Diagnosis not present

## 2019-10-07 DIAGNOSIS — N179 Acute kidney failure, unspecified: Secondary | ICD-10-CM | POA: Diagnosis present

## 2019-10-07 DIAGNOSIS — Z880 Allergy status to penicillin: Secondary | ICD-10-CM

## 2019-10-07 DIAGNOSIS — Z8673 Personal history of transient ischemic attack (TIA), and cerebral infarction without residual deficits: Secondary | ICD-10-CM | POA: Diagnosis not present

## 2019-10-07 DIAGNOSIS — E11628 Type 2 diabetes mellitus with other skin complications: Secondary | ICD-10-CM | POA: Diagnosis present

## 2019-10-07 DIAGNOSIS — E878 Other disorders of electrolyte and fluid balance, not elsewhere classified: Secondary | ICD-10-CM | POA: Diagnosis present

## 2019-10-07 DIAGNOSIS — L03032 Cellulitis of left toe: Secondary | ICD-10-CM | POA: Diagnosis present

## 2019-10-07 DIAGNOSIS — E875 Hyperkalemia: Secondary | ICD-10-CM | POA: Diagnosis present

## 2019-10-07 DIAGNOSIS — E1142 Type 2 diabetes mellitus with diabetic polyneuropathy: Secondary | ICD-10-CM | POA: Diagnosis present

## 2019-10-07 DIAGNOSIS — I4891 Unspecified atrial fibrillation: Secondary | ICD-10-CM | POA: Diagnosis present

## 2019-10-07 DIAGNOSIS — L97528 Non-pressure chronic ulcer of other part of left foot with other specified severity: Secondary | ICD-10-CM | POA: Diagnosis present

## 2019-10-07 DIAGNOSIS — E1152 Type 2 diabetes mellitus with diabetic peripheral angiopathy with gangrene: Principal | ICD-10-CM | POA: Diagnosis present

## 2019-10-07 DIAGNOSIS — L089 Local infection of the skin and subcutaneous tissue, unspecified: Secondary | ICD-10-CM

## 2019-10-07 DIAGNOSIS — Z833 Family history of diabetes mellitus: Secondary | ICD-10-CM

## 2019-10-07 DIAGNOSIS — E1122 Type 2 diabetes mellitus with diabetic chronic kidney disease: Secondary | ICD-10-CM | POA: Diagnosis present

## 2019-10-07 DIAGNOSIS — Z89431 Acquired absence of right foot: Secondary | ICD-10-CM

## 2019-10-07 DIAGNOSIS — I70245 Atherosclerosis of native arteries of left leg with ulceration of other part of foot: Secondary | ICD-10-CM | POA: Diagnosis present

## 2019-10-07 DIAGNOSIS — I252 Old myocardial infarction: Secondary | ICD-10-CM | POA: Diagnosis not present

## 2019-10-07 DIAGNOSIS — Z8349 Family history of other endocrine, nutritional and metabolic diseases: Secondary | ICD-10-CM

## 2019-10-07 DIAGNOSIS — N1831 Chronic kidney disease, stage 3a: Secondary | ICD-10-CM | POA: Diagnosis present

## 2019-10-07 DIAGNOSIS — I70262 Atherosclerosis of native arteries of extremities with gangrene, left leg: Secondary | ICD-10-CM | POA: Diagnosis present

## 2019-10-07 DIAGNOSIS — Z950 Presence of cardiac pacemaker: Secondary | ICD-10-CM

## 2019-10-07 DIAGNOSIS — I739 Peripheral vascular disease, unspecified: Secondary | ICD-10-CM | POA: Diagnosis not present

## 2019-10-07 DIAGNOSIS — E871 Hypo-osmolality and hyponatremia: Secondary | ICD-10-CM | POA: Diagnosis present

## 2019-10-07 DIAGNOSIS — E1165 Type 2 diabetes mellitus with hyperglycemia: Secondary | ICD-10-CM | POA: Diagnosis present

## 2019-10-07 DIAGNOSIS — Z7901 Long term (current) use of anticoagulants: Secondary | ICD-10-CM

## 2019-10-07 DIAGNOSIS — Z951 Presence of aortocoronary bypass graft: Secondary | ICD-10-CM

## 2019-10-07 DIAGNOSIS — E11621 Type 2 diabetes mellitus with foot ulcer: Secondary | ICD-10-CM | POA: Diagnosis present

## 2019-10-07 LAB — URINALYSIS, COMPLETE (UACMP) WITH MICROSCOPIC
Bacteria, UA: NONE SEEN
Bilirubin Urine: NEGATIVE
Glucose, UA: >=500 mg/dL — AB
Hgb urine dipstick: NEGATIVE
Ketones, ur: 5 mg/dL — AB
Leukocytes,Ua: NEGATIVE
Nitrite: NEGATIVE
Protein, ur: NEGATIVE mg/dL
Specific Gravity, Urine: 1.015 (ref 1.005–1.030)
Squamous Epithelial / HPF: NONE SEEN (ref 0–5)
pH: 7 (ref 5.0–8.0)

## 2019-10-07 LAB — COMPREHENSIVE METABOLIC PANEL
ALT: 17 U/L (ref 0–44)
AST: 25 U/L (ref 15–41)
Albumin: 4 g/dL (ref 3.5–5.0)
Alkaline Phosphatase: 79 U/L (ref 38–126)
Anion gap: 15 (ref 5–15)
BUN: 35 mg/dL — ABNORMAL HIGH (ref 8–23)
CO2: 20 mmol/L — ABNORMAL LOW (ref 22–32)
Calcium: 9.2 mg/dL (ref 8.9–10.3)
Chloride: 92 mmol/L — ABNORMAL LOW (ref 98–111)
Creatinine, Ser: 1.9 mg/dL — ABNORMAL HIGH (ref 0.61–1.24)
GFR calc Af Amer: 42 mL/min — ABNORMAL LOW (ref >60)
GFR calc non Af Amer: 36 mL/min — ABNORMAL LOW (ref >60)
Glucose, Bld: 334 mg/dL — ABNORMAL HIGH (ref 70–99)
Potassium: 5.5 mmol/L — ABNORMAL HIGH (ref 3.5–5.1)
Sodium: 127 mmol/L — ABNORMAL LOW (ref 135–145)
Total Bilirubin: 1.3 mg/dL — ABNORMAL HIGH (ref 0.3–1.2)
Total Protein: 9.5 g/dL — ABNORMAL HIGH (ref 6.5–8.1)

## 2019-10-07 LAB — CBC WITH DIFFERENTIAL/PLATELET
Abs Immature Granulocytes: 0.06 10*3/uL (ref 0.00–0.07)
Basophils Absolute: 0 10*3/uL (ref 0.0–0.1)
Basophils Relative: 0 %
Eosinophils Absolute: 0.2 10*3/uL (ref 0.0–0.5)
Eosinophils Relative: 2 %
HCT: 37.6 % — ABNORMAL LOW (ref 39.0–52.0)
Hemoglobin: 12 g/dL — ABNORMAL LOW (ref 13.0–17.0)
Immature Granulocytes: 1 %
Lymphocytes Relative: 9 %
Lymphs Abs: 1.2 10*3/uL (ref 0.7–4.0)
MCH: 29.1 pg (ref 26.0–34.0)
MCHC: 31.9 g/dL (ref 30.0–36.0)
MCV: 91 fL (ref 80.0–100.0)
Monocytes Absolute: 1 10*3/uL (ref 0.1–1.0)
Monocytes Relative: 8 %
Neutro Abs: 10.3 10*3/uL — ABNORMAL HIGH (ref 1.7–7.7)
Neutrophils Relative %: 80 %
RBC: 4.13 MIL/uL — ABNORMAL LOW (ref 4.22–5.81)
RDW: 13.7 % (ref 11.5–15.5)
WBC: 12.8 10*3/uL — ABNORMAL HIGH (ref 4.0–10.5)
nRBC: 0 % (ref 0.0–0.2)

## 2019-10-07 LAB — RESPIRATORY PANEL BY RT PCR (FLU A&B, COVID)
Influenza A by PCR: NEGATIVE
Influenza B by PCR: NEGATIVE
SARS Coronavirus 2 by RT PCR: NEGATIVE

## 2019-10-07 LAB — SEDIMENTATION RATE: Sed Rate: 116 mm/hr — ABNORMAL HIGH (ref 0–20)

## 2019-10-07 LAB — LACTIC ACID, PLASMA
Lactic Acid, Venous: 2.8 mmol/L (ref 0.5–1.9)
Lactic Acid, Venous: 1.6 mmol/L (ref 0.5–1.9)

## 2019-10-07 LAB — GLUCOSE, CAPILLARY: Glucose-Capillary: 264 mg/dL — ABNORMAL HIGH (ref 70–99)

## 2019-10-07 LAB — PLATELET COUNT: Platelets: 136 10*3/uL — ABNORMAL LOW (ref 150–400)

## 2019-10-07 MED ORDER — MAGNESIUM HYDROXIDE 400 MG/5ML PO SUSP: 30.0000 mL | Freq: Every day | ORAL | Status: DC | PRN

## 2019-10-07 MED ORDER — ONDANSETRON HCL 4 MG PO TABS
4.0000 mg | ORAL_TABLET | Freq: Four times a day (QID) | ORAL | Status: DC | PRN
  Filled 2019-10-07: qty 1

## 2019-10-07 MED ORDER — VANCOMYCIN HCL 500 MG/100ML IV SOLN
500.0000 mg | Freq: Once | INTRAVENOUS | Status: AC
  Administered 2019-10-07: 500 mg via INTRAVENOUS
  Filled 2019-10-07 (×2): qty 100

## 2019-10-07 MED ORDER — LISINOPRIL 5 MG PO TABS: 2.5000 mg | ORAL_TABLET | Freq: Every day | ORAL | Status: DC

## 2019-10-07 MED ORDER — SODIUM CHLORIDE 0.9 % IV SOLN
2.0000 g | Freq: Once | INTRAVENOUS | Status: AC
  Administered 2019-10-07: 2 g via INTRAVENOUS
  Filled 2019-10-07: qty 2

## 2019-10-07 MED ORDER — LABETALOL HCL 5 MG/ML IV SOLN: 20.0000 mg | INTRAVENOUS | Status: DC | PRN

## 2019-10-07 MED ORDER — VANCOMYCIN HCL IN DEXTROSE 1-5 GM/200ML-% IV SOLN
1000.0000 mg | INTRAVENOUS | Status: DC
  Filled 2019-10-07: qty 200

## 2019-10-07 MED ORDER — OXYCODONE-ACETAMINOPHEN 5-325 MG PO TABS
1.0000 | ORAL_TABLET | ORAL | Status: DC | PRN
  Administered 2019-10-08 – 2019-10-10 (×5): 1 via ORAL
  Filled 2019-10-07 (×5): qty 1

## 2019-10-07 MED ORDER — ACETAMINOPHEN 650 MG RE SUPP: 650.0000 mg | Freq: Four times a day (QID) | RECTAL | Status: DC | PRN

## 2019-10-07 MED ORDER — TORSEMIDE 20 MG PO TABS
20.0000 mg | ORAL_TABLET | Freq: Every day | ORAL | Status: DC
  Administered 2019-10-08 – 2019-10-11 (×3): 20 mg via ORAL
  Filled 2019-10-07 (×3): qty 1

## 2019-10-07 MED ORDER — FAMOTIDINE 20 MG PO TABS
20.0000 mg | ORAL_TABLET | Freq: Two times a day (BID) | ORAL | Status: DC
  Administered 2019-10-07 – 2019-10-11 (×7): 20 mg via ORAL
  Filled 2019-10-07 (×7): qty 1

## 2019-10-07 MED ORDER — SODIUM CHLORIDE 0.9 % IV SOLN: Freq: Once | INTRAVENOUS | Status: AC

## 2019-10-07 MED ORDER — CARVEDILOL 3.125 MG PO TABS
3.1250 mg | ORAL_TABLET | Freq: Two times a day (BID) | ORAL | Status: DC
  Administered 2019-10-08 – 2019-10-11 (×6): 3.125 mg via ORAL
  Filled 2019-10-07 (×6): qty 1

## 2019-10-07 MED ORDER — GABAPENTIN 600 MG PO TABS
600.0000 mg | ORAL_TABLET | Freq: Two times a day (BID) | ORAL | Status: DC
  Administered 2019-10-07 – 2019-10-11 (×7): 600 mg via ORAL
  Filled 2019-10-07 (×9): qty 1

## 2019-10-07 MED ORDER — ONDANSETRON 4 MG PO TBDP
4.0000 mg | ORAL_TABLET | Freq: Three times a day (TID) | ORAL | Status: DC | PRN
  Filled 2019-10-07: qty 1

## 2019-10-07 MED ORDER — SODIUM CHLORIDE 0.9 % IV SOLN: INTRAVENOUS | Status: DC

## 2019-10-07 MED ORDER — CYANOCOBALAMIN 1000 MCG/ML IJ SOLN
1000.0000 ug | INTRAMUSCULAR | Status: DC
  Filled 2019-10-07: qty 1

## 2019-10-07 MED ORDER — ENOXAPARIN SODIUM 40 MG/0.4ML ~~LOC~~ SOLN
40.0000 mg | SUBCUTANEOUS | Status: DC
  Administered 2019-10-07 – 2019-10-10 (×4): 40 mg via SUBCUTANEOUS
  Filled 2019-10-07 (×4): qty 0.4

## 2019-10-07 MED ORDER — DOCUSATE SODIUM 100 MG PO CAPS
100.0000 mg | ORAL_CAPSULE | Freq: Every day | ORAL | Status: DC
  Administered 2019-10-08 – 2019-10-11 (×3): 100 mg via ORAL
  Filled 2019-10-07 (×3): qty 1

## 2019-10-07 MED ORDER — PIPERACILLIN-TAZOBACTAM 3.375 G IVPB 30 MIN: 3.3750 g | Freq: Once | INTRAVENOUS | Status: DC

## 2019-10-07 MED ORDER — ACETAMINOPHEN 325 MG PO TABS
650.0000 mg | ORAL_TABLET | Freq: Four times a day (QID) | ORAL | Status: DC | PRN
  Administered 2019-10-08 – 2019-10-09 (×2): 650 mg via ORAL
  Filled 2019-10-07 (×3): qty 2

## 2019-10-07 MED ORDER — GLUCOSE 4 G PO CHEW
4.0000 | CHEWABLE_TABLET | ORAL | Status: DC | PRN
  Filled 2019-10-07: qty 4

## 2019-10-07 MED ORDER — TAMSULOSIN HCL 0.4 MG PO CAPS
0.4000 mg | ORAL_CAPSULE | Freq: Every day | ORAL | Status: DC
  Administered 2019-10-08 – 2019-10-11 (×3): 0.4 mg via ORAL
  Filled 2019-10-07 (×3): qty 1

## 2019-10-07 MED ORDER — FLUTICASONE PROPIONATE 50 MCG/ACT NA SUSP
2.0000 | Freq: Every day | NASAL | Status: DC
  Administered 2019-10-08 – 2019-10-11 (×4): 2 via NASAL
  Filled 2019-10-07 (×2): qty 16

## 2019-10-07 MED ORDER — SODIUM CHLORIDE 0.9 % IV SOLN
2.0000 g | Freq: Two times a day (BID) | INTRAVENOUS | Status: DC
  Administered 2019-10-08 – 2019-10-11 (×7): 2 g via INTRAVENOUS
  Filled 2019-10-07 (×8): qty 2

## 2019-10-07 MED ORDER — NITROGLYCERIN 0.4 MG SL SUBL: 0.4000 mg | SUBLINGUAL_TABLET | SUBLINGUAL | Status: DC | PRN

## 2019-10-07 MED ORDER — INSULIN ASPART 100 UNIT/ML ~~LOC~~ SOLN
0.0000 [IU] | Freq: Four times a day (QID) | SUBCUTANEOUS | Status: DC
  Administered 2019-10-07: 5 [IU] via SUBCUTANEOUS
  Administered 2019-10-08: 2 [IU] via SUBCUTANEOUS
  Administered 2019-10-08: 7 [IU] via SUBCUTANEOUS
  Administered 2019-10-08: 5 [IU] via SUBCUTANEOUS
  Administered 2019-10-08: 2 [IU] via SUBCUTANEOUS
  Administered 2019-10-09 (×2): 1 [IU] via SUBCUTANEOUS
  Filled 2019-10-07 (×8): qty 1

## 2019-10-07 MED ORDER — VANCOMYCIN HCL IN DEXTROSE 1-5 GM/200ML-% IV SOLN
1000.0000 mg | Freq: Once | INTRAVENOUS | Status: AC
  Administered 2019-10-07: 1000 mg via INTRAVENOUS
  Filled 2019-10-07: qty 200

## 2019-10-07 MED ORDER — VANCOMYCIN HCL IN DEXTROSE 1-5 GM/200ML-% IV SOLN: 1000.0000 mg | Freq: Once | INTRAVENOUS | Status: DC

## 2019-10-07 MED ORDER — ATORVASTATIN CALCIUM 20 MG PO TABS
40.0000 mg | ORAL_TABLET | Freq: Every day | ORAL | Status: DC
  Administered 2019-10-07 – 2019-10-10 (×4): 40 mg via ORAL
  Filled 2019-10-07 (×4): qty 2

## 2019-10-07 MED ORDER — CYCLOBENZAPRINE HCL 10 MG PO TABS: 10.0000 mg | ORAL_TABLET | Freq: Two times a day (BID) | ORAL | Status: DC | PRN

## 2019-10-07 MED ORDER — ONDANSETRON HCL 4 MG/2ML IJ SOLN: 4.0000 mg | Freq: Four times a day (QID) | INTRAMUSCULAR | Status: DC | PRN

## 2019-10-07 MED ORDER — TRAZODONE HCL 50 MG PO TABS: 25.0000 mg | ORAL_TABLET | Freq: Every evening | ORAL | Status: DC | PRN

## 2019-10-07 MED ORDER — SEMAGLUTIDE (1 MG/DOSE) 2 MG/1.5ML ~~LOC~~ SOPN: 1.5000 mg | PEN_INJECTOR | SUBCUTANEOUS | Status: DC

## 2019-10-07 MED ORDER — VITAMIN D3 25 MCG (1000 UNIT) PO TABS
1000.0000 [IU] | ORAL_TABLET | Freq: Every day | ORAL | Status: DC
  Administered 2019-10-08 – 2019-10-11 (×3): 1000 [IU] via ORAL
  Filled 2019-10-07 (×7): qty 1

## 2019-10-07 MED ORDER — LORATADINE 10 MG PO TABS
10.0000 mg | ORAL_TABLET | Freq: Every day | ORAL | Status: DC
  Administered 2019-10-08 – 2019-10-11 (×3): 10 mg via ORAL
  Filled 2019-10-07 (×3): qty 1

## 2019-10-07 NOTE — Plan of Care (Signed)
  Problem: Education: Goal: Knowledge of  General Education information/materials will improve Outcome: Progressing Goal: Emotional status will improve Outcome: Progressing Goal: Mental status will improve Outcome: Progressing Goal: Verbalization of understanding the information provided will improve Outcome: Progressing   

## 2019-10-07 NOTE — ED Notes (Signed)
Lactic 2.8, dr Jimmye Norman notified.

## 2019-10-07 NOTE — ED Triage Notes (Signed)
Pt via ems from home with foot ulcer on his left great toe. 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 Pt alert & oriented, nad noted.

## 2019-10-07 NOTE — Progress Notes (Signed)
Pharmacy Antibiotic Note  Jason Montes is a 69 y.o. male admitted on 10/07/2019 with cellulitis and possible osteomyelitis.  Pharmacy has been consulted for Vancomycin dosing. Patient is also ordered Cefepime.  Plan: Vancomycin 1000 IV every 24 hours.  Goal trough 15-20 mcg/mL. Cefepime 2g IV q12h, dose is renally adjusted.   Follow renal function closely.  Height: 5\' 9"  (175.3 cm) Weight: 79.4 kg (175 lb) IBW/kg (Calculated) : 70.7  Temp (24hrs), Avg:98.1 F (36.7 C), Min:98.1 F (36.7 C), Max:98.1 F (36.7 C)  Recent Labs  Lab 10/07/19 1734 10/07/19 1932  WBC 12.8*  --   CREATININE 1.90*  --   LATICACIDVEN 2.8* 1.6    Estimated Creatinine Clearance: 38.2 mL/min (A) (by C-G formula based on SCr of 1.9 mg/dL (H)).    Allergies  Allergen Reactions  . Norvasc [Amlodipine Besylate]   . Penicillins     Childhood allergy, not sure what happens     Antimicrobials this admission: Vancomycin 5/7 >>  Cefepime 5/7 >>   Dose adjustments this admission:  Microbiology results:  Thank you for allowing pharmacy to be a part of this patient's care.  Paulina Fusi, PharmD, BCPS 10/07/2019 9:10 PM

## 2019-10-07 NOTE — ED Provider Notes (Addendum)
[facility] Emergency Department Provider Note       Time seen: ----------------------------------------- 6:22 PM on 10/07/2019 -----------------------------------------   I have reviewed the triage vital signs and the nursing notes.  HISTORY   Chief Complaint Foot Ulcer   HPI Jason Montes is a 69 y.o. male with a history of atrial fibrillation, chronic kidney disease, diabetes, hyperlipidemia, hypertension, CVA, osteomyelitis who presents to the ED for ulceration to the plantar surface of his left great toe.  Patient states he has history of right foot amputation that started out similarly.  He has mild pain, only 2 out of 10 in the left foot.  He denies any other complaints.  Past Medical History:  Diagnosis Date  . Allergy   . Atrial fibrillation ([place])   . B12 deficiency   . Chronic kidney disease   . Chronic kidney disease (CKD), stage II (mild)   . Coronary atherosclerosis   . Diabetes mellitus without complication ([place])   . Elevated PSA   . Esophagitis   . GERD (gastroesophageal reflux disease)   . Heart attack ([place])   . Heart disease   . Hyperlipidemia   . Hypertension   . Hypokalemia   . Mild neurocognitive disorder   . Osteoarthritis   . Presbyopia   . PVD (peripheral vascular disease) ([place])   . Restless leg syndrome   . Stroke (cerebrum) ([place])   . Stroke ([place])   . Subacute osteomyelitis ([place])   . Tinea unguium   . Uncompensated short term memory deficit     Patient Active Problem List   Diagnosis Date Noted  . Malnutrition of moderate degree 03/13/2019  . ARF (acute renal failure) ([place]) 03/11/2019  . Family history of malignant neoplasm of gastrointestinal tract   . Benign neoplasm of transverse colon   . Benign neoplasm of cecum   . Benign neoplasm of ascending colon   . Benign neoplasm of descending colon   . Polyp of sigmoid colon   . Atrial fibrillation ([place]) 02/25/2018  . Vitamin B12 deficiency anemia 02/25/2018   . CKD (chronic kidney disease) stage 2, GFR 60-89 ml/min 02/25/2018  . CAD (coronary artery disease) 02/25/2018  . DM (diabetes mellitus), type 2, uncontrolled with complications ([place]) [id number]  . GERD with esophagitis 02/25/2018  . Generalized muscle weakness 02/25/2018  . Chronic diastolic heart failure ([place]) 02/25/2018  . Chronic anticoagulation 02/25/2018  . 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 Hyperlipidemia 02/25/2018  . Essential hypertension 02/25/2018  . Hypokalemia 02/25/2018  . Mild neurocognitive disorder 02/25/2018  . Osteoarthritis 02/25/2018  . PVD (peripheral vascular disease) ([place]) 02/25/2018  . Presbyopia 02/25/2018  . Restless leg syndrome 02/25/2018  . History of stroke with current residual effects 02/25/2018  . Subacute osteomyelitis of right foot ([place]) 02/25/2018  . Tinea unguium 02/25/2018    Past Surgical History:  Procedure Laterality Date  . CARDIAC PACEMAKER PLACEMENT    . COLONOSCOPY WITH PROPOFOL N/A 05/18/2018   Procedure: COLONOSCOPY WITH PROPOFOL;  Surgeon: [name], MD;  Location: [facility] ENDOSCOPY;  Service: Endoscopy;  Laterality: N/A;  . CORONARY ARTERY BYPASS GRAFT    . FOOT AMPUTATION Right     Allergies Norvasc [amlodipine besylate] and Penicillins  Social History Social History   Tobacco Use  . Smoking status: Never Smoker  . Smokeless tobacco: Never Used  Substance Use Topics  . Alcohol use: Never  . Drug use: Never    Review of Systems Constitutional: Negative for fever. Cardiovascular: Negative for  chest pain. Respiratory: Negative for shortness of breath. Gastrointestinal: Negative for abdominal pain, vomiting and diarrhea. Musculoskeletal: Positive for mild left foot pain Skin: Positive for left great toe ulceration Neurological: Negative for headaches, focal weakness or numbness.  All systems negative/normal/unremarkable except as stated in the  HPI  ____________________________________________   PHYSICAL EXAM:  VITAL SIGNS: ED Triage Vitals  Enc Vitals Group     BP 10/07/19 1710 (!) 158/55     Pulse Rate 10/07/19 1710 85     Resp 10/07/19 1710 20     Temp 10/07/19 1710 98.1 F (36.7 C)     Temp Source 10/07/19 1710 Oral     SpO2 10/07/19 1710 100 %     Weight 10/07/19 1711 175 lb (79.4 kg)     Height 10/07/19 1711 5\' 9"  (1.753 m)     Head Circumference --      Peak Flow --      Pain Score 10/07/19 1710 2     Pain Loc --      Pain Edu? --      Excl. in [place]? --     Constitutional: Alert and oriented.  No acute distress Eyes: Conjunctivae are normal.  ENT      Head: Normocephalic and atraumatic.      Nose: No congestion/rhinnorhea.      Mouth/Throat: Mucous membranes are moist.      Neck: No stridor. Cardiovascular: Normal rate, regular rhythm. No murmurs, rubs, or gallops.  Excellent dopplerable dorsalis pedis pulse and posterior tibial pulse in the left foot Respiratory: Normal respiratory effort without tachypnea nor retractions. Breath sounds are clear and equal bilaterally. No wheezes/rales/rhonchi. Gastrointestinal: Soft and nontender. Normal bowel sounds Musculoskeletal: Previous partial amputation of the right foot is noted, left foot with erythema and left great toe ulceration with some concerns for necrosis. Neurologic:  Normal speech and language. No gross focal neurologic deficits are appreciated.  Skin: Left great toe ulceration over the plantar surface, there is erythema that extends from left great toe to the dorsum of the left foot Psychiatric: Mood and affect are normal. ____________________________________________  ED COURSE:  As part of my medical decision making, I reviewed the following data within the [place] History obtained from family if available, nursing notes, old chart and ekg, as well as notes from prior ED visits. Patient presented for foot ulceration, we will  assess with labs and imaging as indicated at this time.   Procedures  Jason Montes was evaluated in Emergency Department on 10/07/2019 for the symptoms described in the history of present illness. He was evaluated in the context of the global COVID-19 pandemic, which necessitated consideration that the patient might be at risk for infection with the SARS-CoV-2 virus that causes COVID-19. Institutional protocols and algorithms that pertain to the evaluation of patients at risk for COVID-19 are in a state of rapid change based on information released by regulatory bodies including the CDC and federal and state organizations. These policies and algorithms were followed during the patient's care in the ED.  ____________________________________________   LABS (pertinent positives/negatives)  Labs Reviewed  LACTIC ACID, PLASMA - Abnormal; Notable for the following components:      Result Value   Lactic Acid, Venous 2.8 (*)    All other components within normal limits  COMPREHENSIVE METABOLIC PANEL - Abnormal; Notable for the following components:   Sodium 127 (*)    Potassium 5.5 (*)    Chloride 92 (*)  CO2 20 (*)    Glucose, Bld 334 (*)    BUN 35 (*)    Creatinine, Ser 1.90 (*)    Total Protein 9.5 (*)    Total Bilirubin 1.3 (*)    GFR calc non Af Amer 36 (*)    GFR calc Af Amer 42 (*)    All other components within normal limits  CBC WITH DIFFERENTIAL/PLATELET - Abnormal; Notable for the following components:   WBC 12.8 (*)    RBC 4.13 (*)    Hemoglobin 12.0 (*)    HCT 37.6 (*)    Neutro Abs 10.3 (*)    All other components within normal limits  CULTURE, BLOOD (ROUTINE X 2)  CULTURE, BLOOD (ROUTINE X 2)  RESPIRATORY PANEL BY RT PCR (FLU A&B, COVID)  LACTIC ACID, PLASMA  URINALYSIS, COMPLETE (UACMP) WITH MICROSCOPIC  SEDIMENTATION RATE   CRITICAL CARE Performed by: Laurence Aly   Total critical care time: 30 minutes  Critical care time was exclusive of  separately billable procedures and treating other patients.  Critical care was necessary to treat or prevent imminent or life-threatening deterioration.  Critical care was time spent personally by me on the following activities: development of treatment plan with patient and/or surrogate as well as nursing, discussions with consultants, evaluation of patient's response to treatment, examination of patient, obtaining history from patient or surrogate, ordering and performing treatments and interventions, ordering and review of laboratory studies, ordering and review of radiographic studies, pulse oximetry and re-evaluation of patient's condition.  RADIOLOGY Images were viewed by me  Left great toe x-ray IMPRESSION: 1. No bony destructive findings to suggest osteomyelitis. 2. Possible ulceration along the plantar pad of the great toe. 3. Mild degenerative findings. 4. Atherosclerotic vascular calcifications. ____________________________________________   DIFFERENTIAL DIAGNOSIS   Osteomyelitis, cellulitis, gangrene, diabetic ulceration  FINAL ASSESSMENT AND PLAN  Diabetic foot ulcer, cellulitis   Plan: The patient had presented for an infected diabetic foot ulcer. Patient's labs did reveal lactic acid elevation and hyperglycemia with a sugar 334. Patient's imaging thus far has not revealed osteomyelitis but I suspect osteomay be present.  Unfortunately he has a pacemaker cannot have an MRI of his foot, we may consider CT.  I have ordered broad-spectrum antibiotics to cover for osteomyelitis.  Initial lactic acid level is elevated for which he was given IV fluids.  I will discuss with the hospitalist for admission.   Laurence Aly, MD    Note: This note was generated in part or whole with voice recognition software. Voice recognition is usually quite accurate but there are transcription errors that can and very often do occur. I apologize for any typographical errors that were not  detected and corrected.     Earleen Newport, MD 10/07/19 1944    Earleen Newport, MD 10/07/19 1946

## 2019-10-07 NOTE — H&P (Signed)
Jason Montes at [place] NAME: Jason Montes    MR#:  [id number]  DATE OF BIRTH:  21-Jul-1950  DATE OF ADMISSION:  10/07/2019  PRIMARY CARE PHYSICIAN: [facility]   REQUESTING/REFERRING PHYSICIAN: Lenise Arena, MD CHIEF COMPLAINT:   Chief Complaint  Patient presents with  . Foot Ulcer    HISTORY OF PRESENT ILLNESS:  Jason Montes  is a 69 y.o. Caucasian male with a known history of type 2 diabetes mellitus, hypertension, dyslipidemia, CVA and peripheral vascular disease, who presented to the emergency room with acute onset of increased redness and puffiness in the left big toe extending to the left foot that started on Thursday and get pale white on Thursday.  He denied any pain due to his diabetic polyneuropathy.  He apparently denied notify his daughter who is a paramedic until today and his son-in-law brought him to the ER.  He denied any fever or chills.  No chest pain or dyspnea or cough.  No nausea or vomiting or abdominal pain.   Upon presentation to the emergency room, blood pressure was 158/55 with otherwise normal vital signs.  Urinalysis showed more than 500 glucose and CMP was remarkable for hyponatremia 127 and hypochloremia of 92, hyperkalemia 5.5 with hyperglycemia of 334 with BUN of 35 and creatinine 1.9 and total bili 1.3 and lactic acid 2.6 and later 1.6.  CBC showed leukocytosis of 12.8 with neutrophil with mild anemia.  COVID-19 PCR is currently pending.  Blood cultures were drawn.  Foot x-ray showed possible ulceration along the plantar pad of the great toe, mild degenerative findings and atherosclerotic vascular calcifications with no bony destructive findings to suggest osteomyelitis.  The patient was given IV cefepime and vancomycin and ordered 2 L bolus of IV normal saline.  He will be admitted to a medical bed for further evaluation and management. PAST MEDICAL HISTORY:   Past Medical History:  Diagnosis Date  .  Allergy   . Atrial fibrillation ([place])   . B12 deficiency   . Chronic kidney disease   . Chronic kidney disease (CKD), stage II (mild)   . Coronary atherosclerosis   . Diabetes mellitus without complication ([place])   . Elevated PSA   . Esophagitis   . GERD (gastroesophageal reflux disease)   . Heart attack ([place])   . Heart disease   . Hyperlipidemia   . Hypertension   . Hypokalemia   . Mild neurocognitive disorder   . Osteoarthritis   . Presbyopia   . PVD (peripheral vascular disease) ([place])   . Restless leg syndrome   . Stroke (cerebrum) ([place])   . Stroke ([place])   . Subacute osteomyelitis ([place])   . Tinea unguium   . Uncompensated short term memory deficit     PAST SURGICAL HISTORY:   Past Surgical History:  Procedure Laterality Date  . CARDIAC PACEMAKER PLACEMENT    . COLONOSCOPY WITH PROPOFOL N/A 05/18/2018   Procedure: COLONOSCOPY WITH PROPOFOL;  Surgeon: [name], MD;  [facility] END[facility];  Service: Endoscopy;  Laterality: N/A;  . CORONARY ARTERY BYPASS GRAFT    . FOOT AMPUTATION Right     SOCIAL HISTORY:   Social History   Tobacco Use  . Smoking status: Never Smoker  . Smokeless tobacco: Never Used  Substance Use Topics  . Alcohol use: Never    FAMILY HISTORY:   Family History  Problem Relation Age of Onset  . Heart failure Mother   . Colon cancer Mother   .  Diabetes Mother   . Thyroid disease Sister     DRUG ALLERGIES:   Allergies  Allergen Reactions  . Norvasc [Amlodipine Besylate]   . Penicillins     Childhood allergy, not sure what happens     REVIEW OF SYSTEMS:   ROS As per history of present illness. All pertinent systems were reviewed above. Constitutional,  HEENT, cardiovascular, respiratory, GI, GU, musculoskeletal, neuro, psychiatric, endocrine,  integumentary and hematologic systems were reviewed and are otherwise  negative/unremarkable except for positive findings mentioned above in the HPI.   MEDICATIONS AT HOME:    Prior to Admission medications   Medication Sig Start Date End Date Taking? Authorizing Provider  apixaban (ELIQUIS) 5 MG TABS tablet Take 5 mg by mouth 2 (two) times daily.    [provider]  aspirin EC 81 MG tablet Take 81 mg by mouth daily.    [provider]  atorvastatin (LIPITOR) 40 MG tablet Take 40 mg by mouth at bedtime.     [provider]  betamethasone valerate (VALISONE) 0.1 % cream Apply 1 application topically 2 (two) times daily as needed.    [provider]  carvedilol (COREG) 3.125 MG tablet Take 3.125 mg by mouth 2 (two) times daily with a meal.    [provider]  cetirizine (ZYRTEC) 10 MG tablet Take 10 mg by mouth daily.    [provider]  Cholecalciferol (VITAMIN D3) 1000 units CAPS Take 1,000 Units by mouth daily.     [provider]  cyanocobalamin (,VITAMIN B-12,) 1000 MCG/ML injection Inject 1,000 mcg into the muscle every 30 (thirty) days.    [provider]  cyclobenzaprine (FLEXERIL) 10 MG tablet Take 10 mg by mouth 2 (two) times daily as needed for muscle spasms.    [provider]  docusate sodium (COLACE) 50 MG capsule Take 100 mg by mouth daily.     [provider]  empagliflozin (JARDIANCE) 25 MG TABS tablet Take 12.5 mg by mouth daily.    [provider]  famotidine (PEPCID) 20 MG tablet Take 20 mg by mouth 2 (two) times daily.    [provider]  fluticasone (FLONASE) 50 MCG/ACT nasal spray Place 2 sprays into both nostrils daily.     [provider]  gabapentin (NEURONTIN) 600 MG tablet Take 600 mg by mouth 2 (two) times daily.     [provider]  glucose 4 GM chewable tablet Chew 4 tablets by mouth as needed for low blood sugar.    [provider]  lisinopril (ZESTRIL) 2.5 MG tablet Take 2.5 mg by mouth daily.    [provider]  metFORMIN (GLUCOPHAGE) 500 MG tablet Start 1 tab by mouth in am daily for 7 days.  Then increase to 1 tab in am & pm for 7 days.  Continue weekly increase to 2 tabs twice daily. Patient taking differently: Take 500 mg by mouth 2 (two) times daily.  02/24/18   Mikey College, NP  nitroGLYCERIN (NITROSTAT) 0.4 MG SL tablet Place 0.4 mg under the tongue every 5 (five) minutes as needed for chest pain.    [provider]  ondansetron (ZOFRAN ODT) 4 MG disintegrating tablet Take 1 tablet (4 mg total) by mouth every 8 (eight) hours as needed for nausea or vomiting. 04/08/19   Nena Polio, MD  oxyCODONE-acetaminophen (PERCOCET) 5-325 MG tablet Take 1 tablet by mouth every 4 (four) hours as needed for severe pain. 04/08/19 04/07/20  Nena Polio,  MD  Semaglutide, 1 MG/DOSE, 2 MG/1.5ML SOPN Inject 1.5 mg into the skin once a week.    [provider]  tamsulosin (FLOMAX) 0.4 MG CAPS capsule Take 0.4 mg by mouth daily.     [provider]  torsemide (DEMADEX) 20 MG tablet Take 20 mg by mouth daily.     [provider]      VITAL SIGNS:  Blood pressure (!) 147/76, pulse 86, temperature 98.1 F (36.7 C), temperature source Oral, resp. rate 18, height 5\' 9"  (1.753 m), weight 79.4 kg, SpO2 100 %.  PHYSICAL EXAMINATION:  Physical Exam  GENERAL:  69 y.o.-year-old Caucasian male patient lying in the bed with no acute distress.  EYES: Pupils equal, round, reactive to light and accommodation. No scleral icterus. Extraocular muscles intact.  HEENT: Head atraumatic, normocephalic. Oropharynx and nasopharynx clear.  NECK:  Supple, no jugular venous distention. No thyroid enlargement, no tenderness.  LUNGS: Normal breath sounds bilaterally, no wheezing, rales,rhonchi or crepitation. No use of accessory muscles of respiration.  CARDIOVASCULAR: Regular rate and rhythm, S1, S2 normal. No murmurs, rubs, or gallops.  ABDOMEN: Soft, nondistended, nontender. Bowel sounds present. No organomegaly or mass.  EXTREMITIES: No pedal edema, cyanosis, or clubbing.   NEUROLOGIC: Cranial nerves II through XII are intact. Muscle strength 5/5 in all extremities. Sensation intact. Gait not checked.  PSYCHIATRIC: The patient is alert and oriented x 3.  Normal affect and good eye contact. SKIN: Left big toe swelling with whitish discoloration and skin sloughing/ulceration with erythema extending to the dorsum of the foot as shown below.   LABORATORY PANEL:   CBC Recent Labs  Lab 10/07/19 1734  WBC 12.8*  HGB 12.0*  HCT 37.6*  PLT PLATELET CLUMPING, SUGGEST RECOLLECTION OF SAMPLE IN CITRATE TUBE.   ------------------------------------------------------------------------------------------------------------------  Chemistries  Recent Labs  Lab 10/07/19 1734  NA 127*  K 5.5*  CL 92*  CO2 20*  GLUCOSE 334*  BUN 35*  CREATININE 1.90*  CALCIUM 9.2  AST 25  ALT 17  ALKPHOS 79  BILITOT 1.3*   ------------------------------------------------------------------------------------------------------------------  Cardiac Enzymes No results for input(s): TROPONINI in the last 168 hours. ------------------------------------------------------------------------------------------------------------------  RADIOLOGY:  DG Toe Great Left  Result Date: 10/07/2019 CLINICAL DATA:  Ulcer on the great toe. EXAM: LEFT GREAT TOE COMPARISON:  09/21/2017 FINDINGS: Atherosclerotic vascular calcifications. No bony destructive findings to suggest osteomyelitis. Mild spurring at the first MTP joint and at the interphalangeal joint. Mild spurring of the lateral sesamoid of the great toe. Subtle lucency along the plantar pad of the great toe, possibly corresponding to the ulceration. IMPRESSION: 1. No bony destructive findings to suggest osteomyelitis. 2. Possible ulceration along the plantar pad of the great toe. 3. Mild degenerative findings. 4. Atherosclerotic vascular calcifications. Electronically Signed   By: Van Clines M.D.   On: 10/07/2019 18:55       IMPRESSION AND PLAN:   1.  Left diabetic foot ulcer with severe nonpurulent cellulitis. -The patient will be admitted to a medical bed. -We will continue antibiotic therapy with IV vancomycin and IV cefepime given his penicillin allergy. -Podiatry consult will be obtained in a.m. -I notified Dr. Luana Shu about the patient.  2.  Uncontrolled type 2 diabetes mellitus with hyperglycemia and polyneuropathy. -The patient will be placed on supplement coverage with NovoLog.  Glucophage will be held off. -We will continue Neurontin.  3.  Hyperkalemia. -We will hold off his lisinopril and given 8 units of IV NovoLog.  4.  Acute kidney injury. -The  patient will be hydrated with IV normal saline and will follow his BMP.  We will hold off lisinopril.  5.  Atrial fibrillation. -We will continue Eliquis and Coreg.  6.  Hypertension. -We will continue Coreg and hold off lisinopril given acute kidney injury.  7.  Dyslipidemia. -We will continue statin therapy.  8.  BPH. -We will continue Flomax.  9.  DVT prophylaxis. -Subcutaneous Lovenox   All the records are reviewed and case discussed with ED provider. The plan of care was discussed in details with the patient (and family). I answered all questions. The patient agreed to proceed with the above mentioned plan. Further management will depend upon hospital course.   CODE STATUS: Full code  Status is: Inpatient  Remains inpatient appropriate because:Ongoing active pain requiring inpatient pain management, Unsafe d/c plan, IV treatments appropriate due to intensity of illness or inability to take PO and Inpatient level of care appropriate due to severity of illness   Dispo: The patient is from: Home              Anticipated d/c is to: Home              Anticipated d/c date is: 3 days              Patient currently is not medically stable to d/c.    TOTAL TIME TAKING CARE OF THIS PATIENT: 55 minutes.    Christel Mormon M.D on  10/07/2019 at 8:37 PM  [facility]   From 7 PM-7 AM, contact night-coverage [email or web address]  CC: Primary care physician; [facility]   Note: This dictation was prepared with Dragon dictation along with smaller phrase technology. Any transcriptional errors that result from this process are unintentional.

## 2019-10-08 LAB — BASIC METABOLIC PANEL
Anion gap: 8 (ref 5–15)
BUN: 31 mg/dL — ABNORMAL HIGH (ref 8–23)
CO2: 21 mmol/L — ABNORMAL LOW (ref 22–32)
Calcium: 8.3 mg/dL — ABNORMAL LOW (ref 8.9–10.3)
Chloride: 104 mmol/L (ref 98–111)
Creatinine, Ser: 1.64 mg/dL — ABNORMAL HIGH (ref 0.61–1.24)
GFR calc Af Amer: 50 mL/min — ABNORMAL LOW (ref >60)
GFR calc non Af Amer: 43 mL/min — ABNORMAL LOW (ref >60)
Glucose, Bld: 225 mg/dL — ABNORMAL HIGH (ref 70–99)
Potassium: 3.7 mmol/L (ref 3.5–5.1)
Sodium: 133 mmol/L — ABNORMAL LOW (ref 135–145)

## 2019-10-08 LAB — CBC
HCT: 29.2 % — ABNORMAL LOW (ref 39.0–52.0)
Hemoglobin: 9.8 g/dL — ABNORMAL LOW (ref 13.0–17.0)
MCH: 29.3 pg (ref 26.0–34.0)
MCHC: 33.6 g/dL (ref 30.0–36.0)
MCV: 87.4 fL (ref 80.0–100.0)
Platelets: UNDETERMINED 10*3/uL (ref 150–400)
RBC: 3.34 MIL/uL — ABNORMAL LOW (ref 4.22–5.81)
RDW: 13.8 % (ref 11.5–15.5)
WBC: 9.1 10*3/uL (ref 4.0–10.5)
nRBC: 0 % (ref 0.0–0.2)

## 2019-10-08 LAB — GLUCOSE, CAPILLARY
Glucose-Capillary: 189 mg/dL — ABNORMAL HIGH (ref 70–99)
Glucose-Capillary: 271 mg/dL — ABNORMAL HIGH (ref 70–99)
Glucose-Capillary: 340 mg/dL — ABNORMAL HIGH (ref 70–99)

## 2019-10-08 LAB — HEMOGLOBIN A1C
Hgb A1c MFr Bld: 13.7 % — ABNORMAL HIGH (ref 4.8–5.6)
Mean Plasma Glucose: 346.49 mg/dL

## 2019-10-08 NOTE — Evaluation (Signed)
Physical Therapy Evaluation Patient Details Name: Jason Montes MRN: [id number] DOB: Jun 27, 1950 Today's Date: 10/08/2019   History of Present Illness  Jason Montes  is a 69 y.o. Caucasian male with a known history of type 2 diabetes mellitus, hypertension, pacemaker, R foot amputation, dyslipidemia, CVA and peripheral vascular disease, who presented to the emergency room 10/07/2019 and admitted to the hospital with left diabetic foot ulcer with cellulitis and gangrenous changes to the left distal big toe. He also has uncontrolled DM, a fib, hyperkalemia, and acute kidney injury. Wound care performed and pt to is partial weightbearing with heel contact in a surgical shoe to the left foot.    Clinical Impression  Patient is alert and oriented x4. Reports that prior to hospitalization he lives in a rented room with community bathroom that is very small. He uses a [facility] for ambulation outside the home or when he gets tired in the evening. He denies any falls in the last 6 months. States he was I for ADLs dressing and bathing and assembling prepared meals. He has help daily for 2.5 hours for laundry, meal prep, and groceries. His daughter and son-in-law drive him when he needs to go somewhere but are not available to stay with him. His home has 3 steps to enter with a left handrail and a small bathroom with a shower. He has a SPC and quad cane but no longer has his RW but he thinks he needs one. Upon PT evaluation, patient is already seated but appears to have sufficient strength and coordination for independent bed mobility. Patient is unsteady with transfers and ambulation, requiring support on RW with CGA or min A hand held assist to prevent fall. Patient complains of feeling unsteady when performing functional mobility. He struggles to maintain weight bearing precautions and does put weight through more than just the heel of his L foot, despite repeated cuing. Patient is missing his right foot distal  to midfoot, so his left foot provides significant stability. Given that patient lives alone and is not able to have constant supervision, complains of sensation of imbalance/dizziness, has reduced cognition, and significantly reduced base of support with his current weight bearing status and foot amputation, patient would benefit from short term rehab prior to returning home safely. He demonstrates a significant decrease in functional mobility and independence. Patient voiced concern that narcotic pain medications may be negatively affecting his balance and this was shared with nursing. Patient would benefit from skilled physical therapy to address impairments and functional limitations (see PT Problem List below) to work towards stated goals and return to PLOF or maximal functional independence.       Follow Up Recommendations SNF (in setting of lives alone, fall risk, and imbalance)     Equipment Recommendations  Rolling walker with 5" wheels;3in1 (PT)    Recommendations for Other Services       Precautions / Restrictions Restrictions Weight Bearing Restrictions: Yes LLE Weight Bearing: Partial weight bearing Other Position/Activity Restrictions: partial weightbearing with heel contact in a surgical shoe to the left foot      Mobility  Bed Mobility               General bed mobility comments: patient already in chair. Appears independent.  Transfers Overall transfer level: Needs assistance   Transfers: Sit to/from Stand Sit to Stand: Min guard;Min assist         General transfer comment: Pateint transfered sit <> stand to/from chair x 4. Required min CGA  when transferring using RW and needed min A to prevent fall when transferring without AD.  Ambulation/Gait Ambulation/Gait assistance: Min guard;Min assist Gait Distance (Feet): 160 Feet Assistive device: Rolling walker (2 wheeled);1 person hand held assist Gait Pattern/deviations: Step-through pattern;Step-to  pattern Gait velocity: reduced   General Gait Details: Patient ambulated 160 feet with L post op shoe and R shoe using RW and CGA for safety. Patient wants to use step through gait pattern and unable to maintain step to pattern leading with L foot for more than a few steps with cuing. Step to pattern will keep weight on L heel as weight bearing restrictions dictate. Patent required min A hand held assist to take a few steps from chair to base of stairs without AD due to unsteadiness. Pt continued to report he felt unsteady with ambulation.  Stairs Stairs: Yes Stairs assistance: Min assist;Min guard Stair Management: One rail Left;With cane;Step to pattern Number of Stairs: 4 General stair comments: patient ascended and descended 4 steps mainly using L handrail and SPC. Pt was able to complete with CGA leading with L LE up and down (how he is accustomed to completing stairs) until he attempted to step down leading with R LE and lost his balance requiring min A and he grabbed L handrail. Reports feeling dizzy.  Wheelchair Mobility    Modified Rankin (Stroke Patients Only)       Balance Overall balance assessment: Needs assistance Sitting-balance support: Feet supported;No upper extremity supported Sitting balance-Leahy Scale: Normal Sitting balance - Comments: steady sitting in chair   Standing balance support: Bilateral upper extremity supported;During functional activity Standing balance-Leahy Scale: Fair Standing balance comment: Patient required RW or hand held assist to ambulate safely. Able to stand in front of toilet and urinate without loss of balance or physical assist (supervision for safety).                             Pertinent Vitals/Pain Pain Assessment: 0-10 Pain Score: 4  Pain Location: left great toe Pain Intervention(s): Limited activity within patient's tolerance;Monitored during session;Repositioned    Home Living Family/patient expects to be  discharged to:: Other (Comment)(rents a room with a shared bathroom) Living Arrangements: Alone Available Help at Discharge: Family;Personal care attendant;Available PRN/intermittently Type of Home: House Home Access: Stairs to enter Entrance Stairs-Rails: Left Entrance Stairs-Number of Steps: 3 Home Layout: Able to live on main level with bedroom/bathroom Home Equipment: Kasandra Knudsen - single point Additional Comments: Patient reports he rents a room where he uses a common bathroom.    Prior Function Level of Independence: Needs assistance   Gait / Transfers Assistance Needed: Ambulates in the home without AD unless he is tired. Ambulates with SPC outside home.  ADL's / Homemaking Assistance Needed: Someone comes to assist him 2.5 hrs a day with meal prep, groceries, laundery, etc. He is I with ADLs dressing and showering. Uses urinal in room at night because he cannot get to the bathroom fully clothed in time. Used to have a RW but thinks it is lost. has a legal guardian. Daughter and son in law take him places when he leaves teh home.        Hand Dominance   Dominant Hand: Left    Extremity/Trunk Assessment   Upper Extremity Assessment Upper Extremity Assessment: RUE deficits/detail RUE Deficits / Details: slight spacticity and lack of coordination due to prior stroke RUE Sensation: decreased light touch RUE Coordination: decreased fine motor;decreased  gross motor    Lower Extremity Assessment Lower Extremity Assessment: RLE deficits/detail RLE Deficits / Details: Half foot amputation (has one boot with built in prosthesis, uses shoe with socks stuffed in the toe at hospital). RLE Sensation: decreased light touch    Cervical / Trunk Assessment Cervical / Trunk Assessment: Normal  Communication   Communication: No difficulties  Cognition Arousal/Alertness: Awake/alert Behavior During Therapy: WFL for tasks assessed/performed Overall Cognitive Status: History of cognitive  impairments - at baseline                                 General Comments: Pt has hx of stroke that he is aware has negatively affected his cognition. 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 Has a legal guardian per chart      General Comments      Exercises Other Exercises Other Exercises: assisted pt with using restroom   Assessment/Plan    PT Assessment Patient needs continued PT services  PT Problem List Decreased coordination;Pain;Impaired sensation;Decreased activity tolerance;Decreased balance;Decreased safety awareness;Decreased mobility;Decreased skin integrity;Decreased knowledge of use of DME;Decreased cognition       PT Treatment Interventions DME instruction;Balance training;Gait training;Stair training;Functional mobility training;Therapeutic activities;Therapeutic exercise;Patient/family education;Cognitive remediation;Neuromuscular re-education    PT Goals (Current goals can be found in the Care Plan section)  Acute Rehab PT Goals Patient Stated Goal: return home PT Goal Formulation: With patient Time For Goal Achievement: 10/22/19 Potential to Achieve Goals: Good    Frequency Min 2X/week   Barriers to discharge Decreased caregiver support patient lives alone and does not have constant caregiver support    Co-evaluation               AM-PAC PT "6 Clicks" Mobility  Outcome Measure Help needed turning from your back to your side while in a flat bed without using bedrails?: None Help needed moving from lying on your back to sitting on the side of a flat bed without using bedrails?: None Help needed moving to and from a bed to a chair (including a wheelchair)?: A Little Help needed standing up from a chair using your arms (e.g., wheelchair or bedside chair)?: A Little Help needed to walk in hospital room?: A Little Help needed climbing 3-5 steps with a railing? : A Little 6 Click Score: 20    End of Session Equipment Utilized  During Treatment: Gait belt;Other (comment)(left post-op shoe, R shoe from home prepared for R foot amputation) Activity Tolerance: Patient tolerated treatment well;Other (comment)(limited by feeling of imbalance) Patient left: in chair;with call bell/phone within reach;with chair alarm set Nurse Communication: Mobility status PT Visit Diagnosis: Unsteadiness on feet (R26.81);Difficulty in walking, not elsewhere classified (R26.2)    Time: [id number] PT Time Calculation (min) (ACUTE ONLY): 45 min   Charges:   PT Evaluation $PT Eval Moderate Complexity: 1 Mod PT Treatments $Gait Training: 8-22 mins $Therapeutic Activity: 8-22 mins       Everlean Alstrom. Graylon Good, PT, DPT 10/08/19, 5:15 PM

## 2019-10-08 NOTE — Consult Note (Signed)
PODIATRY / FOOT AND ANKLE SURGERY CONSULTATION NOTE  Requesting Physician: Eugenie Norrie MD  Reason for consult: Left hallux infection  Chief Complaint: Left hallux wound/infection   HPI: Jason Montes is a 69 y.o. male who presents with a wound to the distal aspect of the left great toe with associated redness and swelling.  Patient states that it has been going on for around a week or 2.  He noticed a blister that had formed on the dorsal lateral aspect of the foot.  He also notes that he had a callus to the plantar medial aspect of the IPJ hallux on the left foot.  He noticed increased redness and swelling about a week ago but states it has been improving gradually.  His daughter who used to be a paramedic saw him and saw that his toe was infected and sent him to the emergency room where he was subsequently admitted due to cellulitis and gangrenous changes to the left distal big toe.  Patient has a history of peripheral vascular disease and amputation on the right side consisting of a prior very proximal transmetatarsal amputation.  He states that he previously had a right hallux toe amputation due to gangrene but that did not heal so they had to amputate most of his foot.  He has concerned that a similar issue may start to happen on his left side.  He states that he cannot ambulate very well.  He does have numbness and tingling to bilateral lower extremities.  He does endorse some pain to the left big toe but overall feels fairly numb.  PMHx:  Past Medical History:  Diagnosis Date  . Allergy   . Atrial fibrillation ([place])   . B12 deficiency   . Chronic kidney disease   . Chronic kidney disease (CKD), stage II (mild)   . Coronary atherosclerosis   . Diabetes mellitus without complication ([place])   . Elevated PSA   . Esophagitis   . GERD (gastroesophageal reflux disease)   . Heart attack ([place])   . Heart disease   . Hyperlipidemia   . Hypertension   . Hypokalemia   . Mild neurocognitive  disorder   . Osteoarthritis   . Presbyopia   . PVD (peripheral vascular disease) ([place])   . Restless leg syndrome   . Stroke (cerebrum) ([place])   . Stroke ([place])   . Subacute osteomyelitis ([place])   . Tinea unguium   . Uncompensated short term memory deficit     Surgical Hx:  Past Surgical History:  Procedure Laterality Date  . CARDIAC PACEMAKER PLACEMENT    . COLONOSCOPY WITH PROPOFOL N/A 05/18/2018   Procedure: COLONOSCOPY WITH PROPOFOL;  Surgeon: [name], MD;  Location: [facility] ENDOSCOPY;  Service: Endoscopy;  Laterality: N/A;  . CORONARY ARTERY BYPASS GRAFT    . FOOT AMPUTATION Right     FHx:  Family History  Problem Relation Age of Onset  . Heart failure Mother   . Colon cancer Mother   . Diabetes Mother   . Thyroid disease Sister     Social History:  reports that he has never smoked. He has never used smokeless tobacco. He reports that he does not drink alcohol or use drugs.  Allergies:  Allergies  Allergen Reactions  . Norvasc [Amlodipine Besylate]   . Penicillins     Childhood allergy, not sure what happens     Review of Systems: General ROS: negative Respiratory ROS: no cough, shortness of breath, or wheezing Cardiovascular ROS: no  chest pain or dyspnea on exertion Musculoskeletal ROS: positive for - gait disturbance, joint pain, joint swelling, muscular weakness and swelling in toe - left Neurological ROS: positive for - numbness/tingling and weakness Dermatological ROS: positive for Left big toe wound  Medications Prior to Admission  Medication Sig Dispense Refill  . apixaban (ELIQUIS) 5 MG TABS tablet Take 5 mg by mouth 2 (two) times daily.    Mar[place] Kitchen aspirin EC 81 MG tablet Take 81 mg by mouth daily.    Marland Kitchen atorvastatin (LIPITOR) 40 MG tablet Take 40 mg by mouth at bedtime.     . betamethasone valerate (VALISONE) 0.1 % cream Apply 1 application topically 2 (two) times daily as needed.    . carvedilol (COREG) 3.125 MG tablet Take 3.125 mg by mouth 2 (two) times  daily with a meal.    . cetirizine (ZYRTEC) 10 MG tablet Take 10 mg by mouth daily.    . Cholecalciferol (VITAMIN D3) 1000 units CAPS Take 1,000 Units by mouth daily.     . cyanocobalamin (,VITAMIN B-12,) 1000 MCG/ML injection Inject 1,000 mcg into the muscle every 30 (thirty) days.    . cyclobenzaprine (FLEXERIL) 10 MG tablet Take 10 mg by mouth 2 (two) times daily as needed for muscle spasms.    Marland Kitchen docusate sodium (COLACE) 50 MG capsule Take 100 mg by mouth daily.     . empagliflozin (JARDIANCE) 25 MG TABS tablet Take 12.5 mg by mouth daily.    . famotidine (PEPCID) 20 MG tablet Take 20 mg by mouth 2 (two) times daily.    . fluticasone (FLONASE) 50 MCG/ACT nasal spray Place 2 sprays into both nostrils daily.     Marland Kitchen gabapentin (NEURONTIN) 600 MG tablet Take 600 mg by mouth 2 (two) times daily.     Marland Kitchen glucose 4 GM chewable tablet Chew 4 tablets by mouth as needed for low blood sugar.    . lisinopril (ZESTRIL) 2.5 MG tablet Take 2.5 mg by mouth daily.    . metFORMIN (GLUCOPHAGE) 500 MG tablet Start 1 tab by mouth in am daily for 7 days. Then increase to 1 tab in am & pm for 7 days.  Continue weekly increase to 2 tabs twice daily. (Patient taking differently: Take 500 mg by mouth 2 (two) times daily. ) 120 tablet 3  . Semaglutide, 1 MG/DOSE, 2 MG/1.5ML SOPN Inject 1.5 mg into the skin once a week.    . tamsulosin (FLOMAX) 0.4 MG CAPS capsule Take 0.4 mg by mouth daily.     Marland Kitchen torsemide (DEMADEX) 20 MG tablet Take 20 mg by mouth daily.     . nitroGLYCERIN (NITROSTAT) 0.4 MG SL tablet Place 0.4 mg under the tongue every 5 (five) minutes as needed for chest pain.      Physical Exam: General: Alert and oriented.  No apparent distress.  Vascular: DP/PT pulses nonpalpable bilateral.  Capillary fill time delayed to the left hallux.  No hair growth present to bilateral lower extremities.  Mild to moderate erythema with edema present to the left big toe.  Neuro: Light touch sensation reduced to bilateral  lower extremities  Derm: Right foot with no open wounds but has a small callus present to the dorsal lateral aspect of the transmetatarsal amputation site.  Left foot at the distal hallux level at the distal tuft and IPJ area has a necrotic type ulceration present that appears to have a blister type formation around the periphery of the area of necrosis.  Once this area  was deroofed it revealed dermal tissue but a stable eschar is noted to the IPJ hallux area with no probing to bone and no bone exposed currently.  Corresponding erythema and edema around the left hallux with no proximal streaking at this time.    MSK: Right transmetatarsal amputation proximal.  Mild left hallux toe pain.  Results for orders placed or performed during the hospital encounter of 10/07/19 (from the past 48 hour(s))  Lactic acid, plasma     Status: Abnormal   Collection Time: 10/07/19  5:34 PM  Result Value Ref Range   Lactic Acid, Venous 2.8 (HH) 0.5 - 1.9 mmol/L    Comment: CRITICAL RESULT CALLED TO, READ BACK BY AND VERIFIED WITH VALERIE CHANDLER AT 1808 10/07/19.PMF Performed at [facility], [address] [place], [place] [address]   Comprehensive metabolic panel     Status: Abnormal   Collection Time: 10/07/19  5:34 PM  Result Value Ref Range   Sodium 127 (L) 135 - 145 mmol/L   Potassium 5.5 (H) 3.5 - 5.1 mmol/L    Comment: HEMOLYSIS AT THIS LEVEL MAY AFFECT RESULT   Chloride 92 (L) 98 - 111 mmol/L   CO2 20 (L) 22 - 32 mmol/L   Glucose, Bld 334 (H) 70 - 99 mg/dL    Comment: Glucose reference range applies only to samples taken after fasting for at least 8 hours.   BUN 35 (H) 8 - 23 mg/dL   Creatinine, Ser 1.90 (H) 0.61 - 1.24 mg/dL   Calcium 9.2 8.9 - 10.3 mg/dL   Total Protein 9.5 (H) 6.5 - 8.1 g/dL   Albumin 4.0 3.5 - 5.0 g/dL   AST 25 15 - 41 U/L    Comment: HEMOLYSIS AT THIS LEVEL MAY AFFECT RESULT   ALT 17 0 - 44 U/L   Alkaline Phosphatase 79 38 - 126 U/L   Total Bilirubin 1.3 (H)  0.3 - 1.2 mg/dL    Comment: HEMOLYSIS AT THIS LEVEL MAY AFFECT RESULT   GFR calc non Af Amer 36 (L) >60 mL/min   GFR calc Af Amer 42 (L) >60 mL/min   Anion gap 15 5 - 15    Comment: Performed at [facility], [address] [place], [place] [address]  CBC with Differential     Status: Abnormal   Collection Time: 10/07/19  5:34 PM  Result Value Ref Range   WBC 12.8 (H) 4.0 - 10.5 K/uL   RBC 4.13 (L) 4.22 - 5.81 MIL/uL   Hemoglobin 12.0 (L) 13.0 - 17.0 g/dL   HCT 37.6 (L) 39.0 - 52.0 %   MCV 91.0 80.0 - 100.0 fL   MCH 29.1 26.0 - 34.0 pg   MCHC 31.9 30.0 - 36.0 g/dL   RDW 13.7 11.5 - 15.5 %   Platelets  150 - 400 K/uL    PLATELET CLUMPING, SUGGEST RECOLLECTION OF SAMPLE IN CITRATE TUBE.    Comment: PLATELET CLUMPS NOTED ON SMEAR, UNABLE TO ESTIMATE PLATELET CLUMPING, SUGGEST RECOLLECTION OF SAMPLE IN CITRATE TUBE.    nRBC 0.0 0.0 - 0.2 %   Neutrophils Relative % 80 %   Neutro Abs 10.3 (H) 1.7 - 7.7 K/uL   Lymphocytes Relative 9 %   Lymphs Abs 1.2 0.7 - 4.0 K/uL   Monocytes Relative 8 %   Monocytes Absolute 1.0 0.1 - 1.0 K/uL   Eosinophils Relative 2 %   Eosinophils Absolute 0.2 0.0 - 0.5 K/uL   Basophils Relative 0 %   Basophils  Absolute 0.0 0.0 - 0.1 K/uL   Immature Granulocytes 1 %   Abs Immature Granulocytes 0.06 0.00 - 0.07 K/uL    Comment: Performed at [facility], [address] [place], [place] [address]  Blood culture (routine x 2)     Status: None (Preliminary result)   Collection Time: 10/07/19  7:28 PM   Specimen: BLOOD  Result Value Ref Range   Specimen Description BLOOD RIGHT AC    Special Requests      BOTTLES DRAWN AEROBIC AND ANAEROBIC Blood Culture adequate volume   Culture      NO GROWTH < 12 HOURS Performed at [facility], [address] [place], [place] [address]    Report Status PENDING   Sedimentation rate     Status: Abnormal   Collection Time: 10/07/19  7:28 PM  Result Value Ref Range   Sed Rate 116 (H) 0 - 20  mm/hr    Comment: Performed at [facility], [address] [place], [place] [address]  Hemoglobin A1c     Status: Abnormal   Collection Time: 10/07/19  7:28 PM  Result Value Ref Range   Hgb A1c MFr Bld 13.7 (H) 4.8 - 5.6 %    Comment: (NOTE) Pre diabetes:          5.7%-6.4% Diabetes:              >6.4% Glycemic control for   <7.0% adults with diabetes    Mean Plasma Glucose 346.49 mg/dL    Comment: Performed at [facility], [place] [address], [place], [place] [address]  Blood culture (routine x 2)     Status: None (Preliminary result)   Collection Time: 10/07/19  7:29 PM   Specimen: BLOOD  Result Value Ref Range   Specimen Description BLOOD LEFT HAND    Special Requests      BOTTLES DRAWN AEROBIC AND ANAEROBIC Blood Culture adequate volume   Culture      NO GROWTH < 12 HOURS Performed at [facility], [address] [place],  [address]    Report Status PENDING   Respiratory Panel by RT PCR (Flu A&B, Covid) - Nasopharyngeal Swab     Status: None   Collection Time: 10/07/19  7:29 PM   Specimen: Nasopharyngeal Swab  Result Value Ref Range   SARS Coronavirus 2 by RT PCR NEGATIVE NEGATIVE    Comment: (NOTE) SARS-CoV-2 target nucleic acids are NOT DETECTED. The SARS-CoV-2 RNA is generally detectable in upper respiratoy specimens during the acute phase of infection. The lowest concentration of SARS-CoV-2 viral copies this assay can detect is 131 copies/mL. A negative result does not preclude SARS-Cov-2 infection and should not be used as the sole basis for treatment or other patient management decisions. A negative result may occur with  improper specimen collection/handling, submission of specimen other than nasopharyngeal swab, presence of viral mutation(s) within the areas targeted by this assay, and inadequate number of viral copies (<131 copies/mL). A negative result must be combined with clinical observations, patient history, and  epidemiological information. The expected result is Negative. Fact Sheet for Patients:  PinkCheek.be Fact Sheet for Healthcare Providers:  GravelBags.it This test is not yet ap proved or cleared by the [place] FDA and  has been authorized for detection and/or diagnosis of SARS-CoV-2 by FDA under an Emergency Use Authorization (EUA). This EUA will remain  in effect (meaning this test can be used) for the duration of the COVID-19 declaration under  Section 564(b)(1) of the Act, 21 U.S.C. section 360bbb-3(b)(1), unless the authorization is terminated or revoked sooner.    Influenza A by PCR NEGATIVE NEGATIVE   Influenza B by PCR NEGATIVE NEGATIVE    Comment: (NOTE) The Xpert Xpress SARS-CoV-2/FLU/RSV assay is intended as an aid in  the diagnosis of influenza from Nasopharyngeal swab specimens and  should not be used as a sole basis for treatment. Nasal washings and  aspirates are unacceptable for Xpert Xpress SARS-CoV-2/FLU/RSV  testing. Fact Sheet for Patients: PinkCheek.be Fact Sheet for Healthcare Providers: GravelBags.it This test is not yet approved or cleared by the [place] FDA and  has been authorized for detection and/or diagnosis of SARS-CoV-2 by  FDA under an Emergency Use Authorization (EUA). This EUA will remain  in effect (meaning this test can be used) for the duration of the  Covid-19 declaration under Section 564(b)(1) of the Act, 21  U.S.C. section 360bbb-3(b)(1), unless the authorization is  terminated or revoked. Performed at [facility], [address] [place], [place] [address]   Platelet count     Status: Abnormal   Collection Time: 10/07/19  7:31 PM  Result Value Ref Range   Platelets 136 (L) 150 - 400 K/uL    Comment: PLATELET COUNT PERFORMED ON CITRATED BLOOD Performed at [facility], [address]  [place], [place] [address]   Lactic acid, plasma     Status: None   Collection Time: 10/07/19  7:32 PM  Result Value Ref Range   Lactic Acid, Venous 1.6 0.5 - 1.9 mmol/L    Comment: Performed at [facility], [address] [place], [place] [address]  Urinalysis, Complete w Microscopic     Status: Abnormal   Collection Time: 10/07/19  8:09 PM  Result Value Ref Range   Color, Urine STRAW (A) YELLOW   APPearance CLEAR (A) CLEAR   Specific Gravity, Urine 1.015 1.005 - 1.030   pH 7.0 5.0 - 8.0   Glucose, UA >=500 (A) NEGATIVE mg/dL   Hgb urine dipstick NEGATIVE NEGATIVE   Bilirubin Urine NEGATIVE NEGATIVE   Ketones, ur 5 (A) NEGATIVE mg/dL   Protein, ur NEGATIVE NEGATIVE mg/dL   Nitrite NEGATIVE NEGATIVE   Leukocytes,Ua NEGATIVE NEGATIVE   RBC / HPF 0-5 0 - 5 RBC/hpf   WBC, UA 0-5 0 - 5 WBC/hpf   Bacteria, UA NONE SEEN NONE SEEN   Squamous Epithelial / LPF NONE SEEN 0 - 5    Comment: Performed at [facility], [address] [place], [place] [address]  Glucose, capillary     Status: Abnormal   Collection Time: 10/07/19 10:18 PM  Result Value Ref Range   Glucose-Capillary 264 (H) 70 - 99 mg/dL    Comment: Glucose reference range applies only to samples taken after fasting for at least 8 hours.  Basic metabolic panel     Status: Abnormal   Collection Time: 10/08/19  4:18 AM  Result Value Ref Range   Sodium 133 (L) 135 - 145 mmol/L   Potassium 3.7 3.5 - 5.1 mmol/L   Chloride 104 98 - 111 mmol/L   CO2 21 (L) 22 - 32 mmol/L   Glucose, Bld 225 (H) 70 - 99 mg/dL    Comment: Glucose reference range applies only to samples taken after fasting for at least 8 hours.   BUN 31 (H) 8 - 23 mg/dL   Creatinine, Ser 1.64 (H) 0.61 - 1.24 mg/dL   Calcium 8.3 (L) 8.9 - 10.3  mg/dL   GFR calc non Af Amer 43 (L) >60 mL/min   GFR calc Af Amer 50 (L) >60 mL/min   Anion gap 8 5 - 15    Comment: Performed at [facility], [address] [place], [place] [address]  CBC      Status: Abnormal   Collection Time: 10/08/19  4:18 AM  Result Value Ref Range   WBC 9.1 4.0 - 10.5 K/uL   RBC 3.34 (L) 4.22 - 5.81 MIL/uL   Hemoglobin 9.8 (L) 13.0 - 17.0 g/dL   HCT 29.2 (L) 39.0 - 52.0 %   MCV 87.4 80.0 - 100.0 fL   MCH 29.3 26.0 - 34.0 pg   MCHC 33.6 30.0 - 36.0 g/dL   RDW 13.8 11.5 - 15.5 %   Platelets PLATELET CLUMPS NOTED ON SMEAR, UNABLE TO ESTIMATE 150 - 400 K/uL    Comment: PLATELET CLUMPING, SUGGEST RECOLLECTION OF SAMPLE IN CITRATE TUBE. Immature Platelet Fraction may be clinically indicated, consider ordering this additional test [id number]    nRBC 0.0 0.0 - 0.2 %    Comment: Performed at [facility], [address] [place],  [address]  Glucose, capillary     Status: Abnormal   Collection Time: 10/08/19  5:07 AM  Result Value Ref Range   Glucose-Capillary 189 (H) 70 - 99 mg/dL    Comment: Glucose reference range applies only to samples taken after fasting for at least 8 hours.   DG Toe Great Left  Result Date: 10/07/2019 CLINICAL DATA:  Ulcer on the great toe. EXAM: LEFT GREAT TOE COMPARISON:  09/21/2017 FINDINGS: Atherosclerotic vascular calcifications. No bony destructive findings to suggest osteomyelitis. Mild spurring at the first MTP joint and at the interphalangeal joint. Mild spurring of the lateral sesamoid of the great toe. Subtle lucency along the plantar pad of the great toe, possibly corresponding to the ulceration. IMPRESSION: 1. No bony destructive findings to suggest osteomyelitis. 2. Possible ulceration along the plantar pad of the great toe. 3. Mild degenerative findings. 4. Atherosclerotic vascular calcifications. Electronically Signed   By: Van Clines M.D.   On: 10/07/2019 18:55    Blood pressure (!) 101/54, pulse 75, temperature 97.8 F (36.6 C), temperature source Oral, resp. rate 18, height '5\' 9"'$  (1.753 m), weight 81.3 kg, SpO2 97 %.  Assessment 1. Left hallux gangrene, dry 2. Cellulitis left  hallux 3. PVD 4. Diabetes type 2 with polyneuropathy  Plan -Patient seen and examined. -Deroofed blister type formation to the left hallux with a 15 blade and suture removal kit.  This revealed a stable eschar present with small pinpoint subcutaneous ulceration.  Currently no bone is exposed.  100% excisional wound debridement performed to the left hallux eschar and small ulceration type area with 15 blade without incident.  Minimal bleeding occurred overall. -Discussed with patient that his blood flow to his foot into the left big toe was compromised which is likely led to the gangrenous changes that is seen today. -Applied Betadine gauze to the wound followed by 4 x 4's, Kerlix, Ace wrap with no compression.  Orders written for dressing changes daily. -Patient to be partial weightbearing with heel contact in a surgical shoe to the left foot. -PT consultation ordered. -Consultation placed with vascular surgery with Dr. Trula Slade due to concern for gangrenous changes to the left big toe and overall PVD.  Patient may require CT angiogram with intervention.  Defer to vascular surgery for this and appreciate recommendations. -Appreciate medicine recommendations for antibiotics.  Wound culture was taken and sent off. -Do not believe that any surgical intervention is required at this time as there does not appear to be any bone mets exposed.  X-ray imaging also does not reveal any changes consistent with osteomyelitis.  We will continue to monitor patient till discharge with appropriate follow-up. -Discussed with patient that he is in danger of having a left hallux amputation due to gangrene that is present but it appears to be fairly stable at this time and improving since his admission.  Caroline More, DPM 10/08/2019, 10:13 AM

## 2019-10-08 NOTE — Progress Notes (Signed)
[place] at [place] NAME: Jason Montes    MR#:  [id number]  DATE OF BIRTH:  04/20/1951  SUBJECTIVE:  patient came in with increasing redness and puffiness of the left great toe since last Thursday. Noted difficulty ambulating. Denies much pain. She does say he has neuropathy. REVIEW OF SYSTEMS:   Review of Systems  Constitutional: Negative for chills, fever and weight loss.  HENT: Negative for ear discharge, ear pain and nosebleeds.   Eyes: Negative for blurred vision, pain and discharge.  Respiratory: Negative for sputum production, shortness of breath, wheezing and stridor.   Cardiovascular: Negative for chest pain, palpitations, orthopnea and PND.  Gastrointestinal: Negative for abdominal pain, diarrhea, nausea and vomiting.  Genitourinary: Negative for frequency and urgency.  Musculoskeletal: Negative for back pain and joint pain.  Skin:       Left great toe redness  Neurological: Negative for sensory change, speech change, focal weakness and weakness.  Psychiatric/Behavioral: Negative for depression and hallucinations. The patient is not nervous/anxious.    Tolerating Diet:yes Tolerating PT:   DRUG ALLERGIES:   Allergies  Allergen Reactions  . Norvasc [Amlodipine Besylate]   . Penicillins     Childhood allergy, not sure what happens     VITALS:  Blood pressure (!) 101/54, pulse 75, temperature 97.8 F (36.6 C), temperature source Oral, resp. rate 18, height 5\' 9"  (1.753 m), weight 81.3 kg, SpO2 97 %.  PHYSICAL EXAMINATION:   Physical Exam  GENERAL:  69 y.o.-year-old patient lying in the bed with no acute distress.  EYES: Pupils equal, round, reactive to light and accommodation. No scleral icterus.   HEENT: Head atraumatic, normocephalic. Oropharynx and nasopharynx clear.  NECK:  Supple, no jugular venous distention. No thyroid enlargement, no tenderness.  LUNGS: Normal breath sounds bilaterally, no wheezing, rales,  rhonchi. No use of accessory muscles of respiration.  CARDIOVASCULAR: S1, S2 normal. No murmurs, rubs, or gallops.  ABDOMEN: Soft, nontender, nondistended. Bowel sounds present. No organomegaly or mass.  EXTREMITIES: No cyanosis, clubbing or edema b/l.    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  SKIN: No obvious rash, lesion, or ulcer.   LABORATORY PANEL:  CBC Recent Labs  Lab 10/08/19 0418  WBC 9.1  HGB 9.8*  HCT 29.2*  PLT PLATELET CLUMPS NOTED ON SMEAR, UNABLE TO ESTIMATE    Chemistries  Recent Labs  Lab 10/07/19 1734 10/07/19 1734 10/08/19 0418  NA 127*   < > 133*  K 5.5*   < > 3.7  CL 92*   < > 104  CO2 20*   < > 21*  GLUCOSE 334*   < > 225*  BUN 35*   < > 31*  CREATININE 1.90*   < > 1.64*  CALCIUM 9.2   < > 8.3*  AST 25  --   --   ALT 17  --   --   ALKPHOS 79  --   --   BILITOT 1.3*  --   --    < > = values in this interval not displayed.   Cardiac Enzymes No results for input(s): TROPONINI in the last 168 hours. RADIOLOGY:  DG Toe Great Left  Result Date: 10/07/2019 CLINICAL DATA:  Ulcer on the great toe. EXAM: LEFT GREAT TOE COMPARISON:  09/21/2017 FINDINGS: Atherosclerotic vascular calcifications. No bony  destructive findings to suggest osteomyelitis. Mild spurring at the first MTP joint and at the interphalangeal joint. Mild spurring of the lateral sesamoid of the great toe. Subtle lucency along the plantar pad of the great toe, possibly corresponding to the ulceration. IMPRESSION: 1. No bony destructive findings to suggest osteomyelitis. 2. Possible ulceration along the plantar pad of the great toe. 3. Mild degenerative findings. 4. Atherosclerotic vascular calcifications. Electronically Signed   By: Van Clines M.D.   On: 10/07/2019 18:55   ASSESSMENT AND PLAN:  Jason Montes  is a 69 y.o. Caucasian male with a known history of type 2 diabetes mellitus, hypertension, dyslipidemia, CVA and peripheral vascular disease, who presented to the emergency room with acute onset of increased redness and puffiness in the left big toe extending to the left foot that started on Thursday and get pale white on Thursday.   1.  Left diabetic foot ulcer with mild cellulitis left hallux dry gangrene  -continue IV cefepime given his penicillin allergy. -Appreciate podiatry consultation with Dr. Luana Shu-- bedside debridement done. Recommends partial weight bearing with heel contact and surgical shoe. PT ordered. -Consultation with vascular surgery. Dr. Trula Slade aware-- given his history of PVD and concern for gangrenous changes  2. Uncontrolled type 2 diabetes mellitus with hyperglycemia and polyneuropathy and nephropathy CKD stage IIIa -on supplement coverage with NovoLog.  Glucophage will be held for now. - continue Neurontin.  3.  Hyperkalemia. -We will hold off his lisinopril and given 8 units of IV NovoLog. -K 5.5--3.7  4.  Acute on chronic kidney injury IIIA --cont IV normal saline and will follow his BMP.   --We will hold off lisinopril, metformin -- Baseline creatinine 1.3--1.4 -came in with creatinine of 1.9-- IV fluids-- 1.6  5.  Atrial fibrillation. -continue Eliquis and Coreg.  6.  Hypertension. -on  Coreg and hold off lisinopril given acute kidney injury.  7.  Dyslipidemia. -continue statin therapy.  8.  BPH. -continue Flomax.  9.  DVT prophylaxis. -Subcutaneous Lovenox    Procedures: bedside debridement left great toe Family communication : none Consults : podiatry, vascular CODE STATUS: full DVT Prophylaxis : Lovenox  Status is: Inpatient  Remains inpatient appropriate because:Ongoing diagnostic testing needed not appropriate for outpatient work up  patient may need vascular workup with CT angiogram lower extremity for his right  great toe dry gangrene/mild cellulitis. Currently getting IV antibiotics.   Dispo: The patient is from: Home              Anticipated d/c is to: Home              Anticipated d/c date is: 2 days              Patient currently is not medically stable to d/c.  TOTAL TIME TAKING CARE OF THIS PATIENT: *35* minutes.  >50% time spent on counselling and coordination of care  Note: This dictation was prepared with Dragon dictation along with smaller phrase technology. Any transcriptional errors that result from this process are unintentional.  Fritzi Mandes M.D    [facility]   CC[facility] Primary care physician; [facility]   DOB: [birth year], 69 y.o.   MRN: [id number]

## 2019-10-08 NOTE — Consult Note (Signed)
[facility]ist of [place]  Patient name: ERVINE PEMBLETON MRN: [id number] DOB: [birth year] Sex: male   REQUESTING PROVIDER:   Hospital service   REASON FOR CONSULT:    Arterial insufficiency in left toe wound  HISTORY OF PRESENT ILLNESS:   HEMANT LAFON is a 69 y.o. male, who was admitted to the hospital with a wound on his left great toe with redness and swelling which has been present for 1 to 2 weeks.  He was found to be without palpable pulses and so vascular evaluation was recommended.  The patient has a history of a right transmetatarsal amputation while in prison.  He states that he had several procedures to get this wound to heal.  He does not think he underwent a arterial evaluation at that time.  The patient is a diabetic.  He has a history of stroke as well as heart attack and mild renal insufficiency.  He is a non-smoker  PAST MEDICAL HISTORY    Past Medical History:  Diagnosis Date  . Allergy   . Atrial fibrillation ([place])   . B12 deficiency   . Chronic kidney disease   . Chronic kidney disease (CKD), stage II (mild)   . Coronary atherosclerosis   . Diabetes mellitus without complication ([place])   . Elevated PSA   . Esophagitis   . GERD (gastroesophageal reflux disease)   . Heart attack ([place])   . Heart disease   . Hyperlipidemia   . Hypertension   . Hypokalemia   . Mild neurocognitive disorder   . Osteoarthritis   . Presbyopia   . PVD (peripheral vascular disease) ([place])   . Restless leg syndrome   . Stroke (cerebrum) ([place])   . Stroke ([place])   . Subacute osteomyelitis ([place])   . Tinea unguium   . Uncompensated short term memory deficit      FAMILY HISTORY   Family History  Problem Relation Age of Onset  . Heart failure Mother   . Colon cancer Mother   . Diabetes Mother   . Thyroid disease Sister     SOCIAL HISTORY:   Social History   Socioeconomic History  . Marital status: Single     Spouse name: Not on file  . Number of children: Not on file  . Years of education: Not on file  . Highest education level: Not on file  Occupational History  . Not on file  Tobacco Use  . Smoking status: Never Smoker  . Smokeless tobacco: Never Used  Substance and Sexual Activity  . Alcohol use: Never  . Drug use: Never  . Sexual activity: Not on file  Other Topics Concern  . Not on file  Social History Narrative  . Not on file   Social Determinants of Health   Financial Resource Strain:   . Difficulty of Paying Living Expenses:   Food Insecurity:   . Worried About Charity fundraiser in the Last Year:   . Arboriculturist in the Last Year:   Transportation Needs:   . Film/video editor (Medical):   Marland Kitchen Lack of Transportation (Non-Medical):   Physical Activity:   . Days of Exercise per Week:   . Minutes of Exercise per Session:   Stress:   . Feeling of Stress :   Social Connections:   . Frequency of Communication with Friends and Family:   . Frequency of Social Gatherings with Friends and Family:   . Attends Religious Services:   .  Active Member of Clubs or Organizations:   . Attends Archivist Meetings:   Marland Kitchen Marital Status:   Intimate Partner Violence:   . Fear of Current or Ex-Partner:   . Emotionally Abused:   Marland Kitchen Physically Abused:   . Sexually Abused:     ALLERGIES:    Allergies  Allergen Reactions  . Norvasc [Amlodipine Besylate]   . Penicillins     Childhood allergy, not sure what happens     CURRENT MEDICATIONS:    Current Facility-Administered Medications  Medication Dose Route Frequency Provider Last Rate Last Admin  . 0.9 %  sodium chloride infusion   Intravenous Continuous Fritzi Mandes, MD 50 mL/hr at 10/08/19 1154 Rate Change at 10/08/19 1154  . acetaminophen (TYLENOL) tablet 650 mg  650 mg Oral Q6H PRN Mansy, Jan A, MD   650 mg at 10/08/19 1128   Or  . acetaminophen (TYLENOL) suppository 650 mg  650 mg Rectal Q6H PRN Mansy, Jan A,  MD      . atorvastatin (LIPITOR) tablet 40 mg  40 mg Oral QHS Mansy, Jan A, MD   40 mg at 10/07/19 2217  . carvedilol (COREG) tablet 3.125 mg  3.125 mg Oral BID WC Mansy, Jan A, MD   3.125 mg at 10/08/19 1004  . ceFEPIme (MAXIPIME) 2 g in sodium chloride 0.9 % 100 mL IVPB  2 g Intravenous Q12H Mansy, Jan A, MD 200 mL/hr at 10/08/19 1010 2 g at 10/08/19 1010  . cholecalciferol (VITAMIN D) tablet 1,000 Units  1,000 Units Oral Daily Mansy, Jan A, MD   1,000 Units at 10/08/19 1002  . [START ON 10/10/2019] cyanocobalamin ((VITAMIN B-12)) injection 1,000 mcg  1,000 mcg Intramuscular Q30 days Mansy, Jan A, MD      . cyclobenzaprine (FLEXERIL) tablet 10 mg  10 mg Oral BID PRN Mansy, Jan A, MD      . docusate sodium (COLACE) capsule 100 mg  100 mg Oral Daily Mansy, Jan A, MD   100 mg at 10/08/19 1002  . enoxaparin (LOVENOX) injection 40 mg  40 mg Subcutaneous Q24H Mansy, Jan A, MD   40 mg at 10/07/19 2217  . famotidine (PEPCID) tablet 20 mg  20 mg Oral BID Mansy, Jan A, MD   20 mg at 10/08/19 1002  . fluticasone (FLONASE) 50 MCG/ACT nasal spray 2 spray  2 spray Each Nare Daily Mansy, Jan A, MD   2 spray at 10/08/19 1114  . gabapentin (NEURONTIN) tablet 600 mg  600 mg Oral BID Mansy, Jan A, MD   600 mg at 10/08/19 1002  . glucose chewable tablet 16 g  4 tablet Oral PRN Mansy, Jan A, MD      . insulin aspart (novoLOG) injection 0-9 Units  0-9 Units Subcutaneous Q6H Mansy, Jan A, MD   2 Units at 10/08/19 1000  . labetalol (NORMODYNE) injection 20 mg  20 mg Intravenous Q3H PRN Mansy, Jan A, MD      . loratadine (CLARITIN) tablet 10 mg  10 mg Oral Daily Mansy, Jan A, MD   10 mg at 10/08/19 1002  . magnesium hydroxide (MILK OF MAGNESIA) suspension 30 mL  30 mL Oral Daily PRN Mansy, Jan A, MD      . nitroGLYCERIN (NITROSTAT) SL tablet 0.4 mg  0.4 mg Sublingual Q5 min PRN Mansy, Jan A, MD      . ondansetron [facility]) tablet 4 mg  4 mg Oral Q6H PRN Mansy, Arvella Merles, MD  Or  . ondansetron (ZOFRAN) injection 4 mg  4  mg Intravenous Q6H PRN Mansy, Jan A, MD      . ondansetron (ZOFRAN-ODT) disintegrating tablet 4 mg  4 mg Oral Q8H PRN Mansy, Jan A, MD      . oxyCODONE-acetaminophen (PERCOCET/ROXICET) 5-325 MG per tablet 1 tablet  1 tablet Oral Q4H PRN Mansy, Arvella Merles, MD   1 tablet at 10/08/19 1535  . tamsulosin (FLOMAX) capsule 0.4 mg  0.4 mg Oral Daily Mansy, Jan A, MD   0.4 mg at 10/08/19 1002  . torsemide (DEMADEX) tablet 20 mg  20 mg Oral Daily Mansy, Jan A, MD   20 mg at 10/08/19 1350  . traZODone (DESYREL) tablet 25 mg  25 mg Oral QHS PRN Mansy, Arvella Merles, MD        REVIEW OF SYSTEMS:   [X]  denotes positive finding, [ ]  denotes negative finding Cardiac  Comments:  Chest pain or chest pressure:    Shortness of breath upon exertion:    Short of breath when lying flat:    Irregular heart rhythm:        Vascular    Pain in calf, thigh, or hip brought on by ambulation:    Pain in feet at night that wakes you up from your sleep:     Blood clot in your veins:    Leg swelling:         Pulmonary    Oxygen at home:    Productive cough:     Wheezing:         Neurologic    Sudden weakness in arms or legs:     Sudden numbness in arms or legs:     Sudden onset of difficulty speaking or slurred speech:    Temporary loss of vision in one eye:     Problems with dizziness:         Gastrointestinal    Blood in stool:      Vomited blood:         Genitourinary    Burning when urinating:     Blood in urine:        Psychiatric    Major depression:         Hematologic    Bleeding problems:    Problems with blood clotting too easily:        Skin    Rashes or ulcers: x       Constitutional    Fever or chills:     PHYSICAL EXAM:   Vitals:   10/07/19 2208 10/08/19 0507 10/08/19 0759 10/08/19 1003  BP: 132/64 (!) 100/52 115/60 (!) 101/54  Pulse: 89 75 72 75  Resp: 16 16 18 18   Temp: 98.3 F (36.8 C) 98.4 F (36.9 C) 97.8 F (36.6 C)   TempSrc: Oral Oral Oral   SpO2: 100% 97% 100% 97%    Weight: 81.3 kg     Height: 5\' 9"  (1.753 m)       GENERAL: The patient is a well-nourished male, in no acute distress. The vital signs are documented above. CARDIAC: There is a regular rate and rhythm.  VASCULAR: Palpable femoral pulses bilaterally.  I could not palpate pedal pulses PULMONARY: Nonlabored respirations ABDOMEN: Soft and non-tender with normal pitched bowel sounds.  MUSCULOSKELETAL: There are no major deformities or cyanosis. NEUROLOGIC: No focal weakness or paresthesias are detected. SKIN: See photo below. PSYCHIATRIC: The patient has a normal affect.      STUDIES:  I have reviewed his x-ray with the following results: 1. No bony destructive findings to suggest osteomyelitis. 2. Possible ulceration along the plantar pad of the great toe. 3. Mild degenerative findings. 4. Atherosclerotic vascular calcifications.   ASSESSMENT and PLAN   Diabetic left great toe ulcer: I discussed with the patient that he is likely having issues with this ulcer because of inadequate perfusion secondary to his diabetes.  In order to optimize his chances for wound healing, better evaluation of his blood flow is required.  I discussed that he would need a arteriogram which could be diagnostic as well as therapeutic.  I discussed the details of the procedure as well as the risks and benefits and he wishes to proceed.  We will plan for arteriogram on Monday.  He will need to be n.p.o. after midnight Sunday.   Leia Alf, MD, FACS [facility] [phone] Pager [phone]

## 2019-10-09 DIAGNOSIS — I10 Essential (primary) hypertension: Secondary | ICD-10-CM

## 2019-10-09 DIAGNOSIS — L03116 Cellulitis of left lower limb: Secondary | ICD-10-CM

## 2019-10-09 DIAGNOSIS — N1831 Chronic kidney disease, stage 3a: Secondary | ICD-10-CM

## 2019-10-09 DIAGNOSIS — I739 Peripheral vascular disease, unspecified: Secondary | ICD-10-CM

## 2019-10-09 DIAGNOSIS — E11628 Type 2 diabetes mellitus with other skin complications: Secondary | ICD-10-CM

## 2019-10-09 LAB — GLUCOSE, CAPILLARY
Glucose-Capillary: 139 mg/dL — ABNORMAL HIGH (ref 70–99)
Glucose-Capillary: 130 mg/dL — ABNORMAL HIGH (ref 70–99)
Glucose-Capillary: 231 mg/dL — ABNORMAL HIGH (ref 70–99)
Glucose-Capillary: 302 mg/dL — ABNORMAL HIGH (ref 70–99)
Glucose-Capillary: 233 mg/dL — ABNORMAL HIGH (ref 70–99)

## 2019-10-09 MED ORDER — INSULIN ASPART 100 UNIT/ML ~~LOC~~ SOLN
0.0000 [IU] | Freq: Three times a day (TID) | SUBCUTANEOUS | Status: DC
  Administered 2019-10-09: 7 [IU] via SUBCUTANEOUS
  Administered 2019-10-10 (×2): 1 [IU] via SUBCUTANEOUS
  Administered 2019-10-11: 3 [IU] via SUBCUTANEOUS
  Filled 2019-10-09 (×4): qty 1

## 2019-10-09 MED ORDER — INSULIN ASPART 100 UNIT/ML ~~LOC~~ SOLN
3.0000 [IU] | Freq: Once | SUBCUTANEOUS | Status: AC
  Administered 2019-10-09: 3 [IU] via SUBCUTANEOUS
  Filled 2019-10-09: qty 1

## 2019-10-09 MED ORDER — INSULIN ASPART 100 UNIT/ML ~~LOC~~ SOLN
0.0000 [IU] | Freq: Every day | SUBCUTANEOUS | Status: DC
  Administered 2019-10-09: 2 [IU] via SUBCUTANEOUS
  Filled 2019-10-09: qty 1

## 2019-10-09 NOTE — Progress Notes (Signed)
[name] at [place] NAME: Jason Montes    MR#:  [id number]  DATE OF BIRTH:  1951-05-22  SUBJECTIVE:  patient came in with increasing redness and puffiness of the left great toe since last Thursday. Noted difficulty ambulating. Denies much pain. he does say he has neuropathy. No new complaints today REVIEW OF SYSTEMS:   Review of Systems  Constitutional: Negative for chills, fever and weight loss.  HENT: Negative for ear discharge, ear pain and nosebleeds.   Eyes: Negative for blurred vision, pain and discharge.  Respiratory: Negative for sputum production, shortness of breath, wheezing and stridor.   Cardiovascular: Negative for chest pain, palpitations, orthopnea and PND.  Gastrointestinal: Negative for abdominal pain, diarrhea, nausea and vomiting.  Genitourinary: Negative for frequency and urgency.  Musculoskeletal: Negative for back pain and joint pain.  Skin:       Left great toe redness  Neurological: Negative for sensory change, speech change, focal weakness and weakness.  Psychiatric/Behavioral: Negative for depression and hallucinations. The patient is not nervous/anxious.    Tolerating Diet:yes Tolerating PT: SNF -  DRUG ALLERGIES:   Allergies  Allergen Reactions  . Norvasc [Amlodipine Besylate]   . Penicillins     Childhood allergy, not sure what happens     VITALS:  Blood pressure 117/63, pulse 81, temperature 97.6 F (36.4 C), temperature source Oral, resp. rate 16, height 5\' 9"  (1.753 m), weight 81.3 kg, SpO2 98 %.  PHYSICAL EXAMINATION:   Physical Exam  GENERAL:  69 y.o.-year-old patient lying in the bed with no acute distress.  EYES: Pupils equal, round, reactive to light and accommodation. No scleral icterus.   HEENT: Head atraumatic, normocephalic. Oropharynx and nasopharynx clear.  NECK:  Supple, no jugular venous distention. No thyroid enlargement, no tenderness.  LUNGS: Normal breath sounds  bilaterally, no wheezing, rales, rhonchi. No use of accessory muscles of respiration.  CARDIOVASCULAR: S1, S2 normal. No murmurs, rubs, or gallops.  ABDOMEN: Soft, nontender, nondistended. Bowel sounds present. No organomegaly or mass.  EXTREMITIES: No cyanosis, clubbing or edema b/l.    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  SKIN: No obvious rash, lesion, or ulcer.   LABORATORY PANEL:  CBC Recent Labs  Lab 10/08/19 0418  WBC 9.1  HGB 9.8*  HCT 29.2*  PLT PLATELET CLUMPS NOTED ON SMEAR, UNABLE TO ESTIMATE    Chemistries  Recent Labs  Lab 10/07/19 1734 10/07/19 1734 10/08/19 0418  NA 127*   < > 133*  K 5.5*   < > 3.7  CL 92*   < > 104  CO2 20*   < > 21*  GLUCOSE 334*   < > 225*  BUN 35*   < > 31*  CREATININE 1.90*   < > 1.64*  CALCIUM 9.2   < > 8.3*  AST 25  --   --   ALT 17  --   --   ALKPHOS 79  --   --   BILITOT 1.3*  --   --    < > = values in this interval not displayed.   Cardiac Enzymes No results for input(s): TROPONINI in the last 168 hours. RADIOLOGY:  DG Toe Great Left  Result Date: 10/07/2019 CLINICAL DATA:  Ulcer on the great toe. EXAM: LEFT GREAT TOE COMPARISON:  09/21/2017 FINDINGS: Atherosclerotic  vascular calcifications. No bony destructive findings to suggest osteomyelitis. Mild spurring at the first MTP joint and at the interphalangeal joint. Mild spurring of the lateral sesamoid of the great toe. Subtle lucency along the plantar pad of the great toe, possibly corresponding to the ulceration. IMPRESSION: 1. No bony destructive findings to suggest osteomyelitis. 2. Possible ulceration along the plantar pad of the great toe. 3. Mild degenerative findings. 4. Atherosclerotic vascular calcifications. Electronically Signed   By: Van Clines M.D.   On: 10/07/2019 18:55    ASSESSMENT AND PLAN:  Jason Montes  is a 69 y.o. Caucasian male with a known history of type 2 diabetes mellitus, hypertension, dyslipidemia, CVA and peripheral vascular disease, who presented to the emergency room with acute onset of increased redness and puffiness in the left big toe extending to the left foot that started on Thursday and get pale white on Thursday.   1.  Left diabetic foot ulcer with mild cellulitis left hallux dry gangrene  -continue IV cefepime given his penicillin allergy. -Appreciate podiatry consultation with Dr. Luana Shu-- bedside debridement done. Recommends partial weight bearing with heel contact and surgical shoe.  -PT recommends rehab however patient prefers going home with home health -- [facility] consulted -Consultation with vascular surgery. Dr. Trula Slade -- given his history of PVD and concern for gangrenous changes--- recommends angiogram lower extremity for Monday.  2. Uncontrolled type 2 diabetes mellitus with hyperglycemia and polyneuropathy and nephropathy CKD stage IIIa -on supplement coverage with NovoLog.  Glucophage will be held for now. - continue Neurontin.  3.  Hyperkalemia. -We will hold off his lisinopril and given 8 units of IV NovoLog. -K 5.5-->3.7  4.  Acute on chronic kidney injury IIIA --cont IV normal saline and will follow his BMP.   --We will hold off lisinopril, metformin -- Baseline creatinine 1.3--1.4 -came in with creatinine of 1.9-- IV fluids-- 1.6  5.  Atrial fibrillation. -continue Eliquis and Coreg.  6.  Hypertension. -on  Coreg and hold off lisinopril given acute kidney injury.  7.  Dyslipidemia. -continue statin therapy.  8.  BPH. -continue Flomax.  9.  DVT prophylaxis. -Subcutaneous Lovenox    Procedures: bedside debridement left great toe Family communication : none Consults : podiatry, vascular CODE STATUS: full DVT Prophylaxis : Lovenox  Status is: Inpatient  Remains inpatient appropriate  because:Ongoing diagnostic testing needed not appropriate for outpatient work up  patient may need vascular workup with CT angiogram lower extremity for his right great toe dry gangrene/mild cellulitis. Currently getting IV antibiotics. Patient scheduled for lower extremity angiogram tomorrow   Dispo: The patient is from: Home              Anticipated d/c is to: Home-- patient does not want to go to rehab.              Anticipated d/c date is: 2 days              Patient currently is not medically stable to d/c.  TOTAL TIME TAKING CARE OF THIS PATIENT: *25* minutes.  >50% time spent on counselling and coordination of care  Note: This dictation was prepared with Dragon dictation along with smaller phrase technology. Any transcriptional errors that result from this process are unintentional.  Fritzi Mandes M.D    [facility]   CC: Primary care physician; [facility] [place]   DOB: [birth year], 69 y.o.   MRN: [id number]

## 2019-10-09 NOTE — TOC Initial Note (Addendum)
Transition of Care [facility]) - Progression Note    Patient Details  Name: Jason Montes MRN: [id number] Date of Birth: Oct 13, 1950  Transition of Care [facility]) CM/SW Contact  Boris Sharper, [place] Phone Number: 10/09/2019, 2:36 PM  Clinical Narrative:    CSW contacted pt via phone and pt notified that his daughter was his legal guardian. Pt does not want to go to SNF and wants HH instead. CSW spoke with pt's daughter about PT recommendations and she agreed to [facility] instead of SNF. Pt has had [facility] before but does not have a preference of agencies. CSW contacted Corene Cornea with [facility] and they are able to accept the pt. [facility] to follow for [facility].  Expected Discharge Plan: [place] Barriers to Discharge: Continued Medical Work up  Expected Discharge Plan and Services Expected Discharge Plan: [place] Choice: [place]: PT, OT [facility] Agency: [place] ([facility]) Date [facility] Agency Contacted: 10/09/19 Time [place]: 1431 Representative spoke with at [place]: [place] ([place]) Interventions    Readmission Risk Interventions No flowsheet data found.

## 2019-10-10 ENCOUNTER — Other Ambulatory Visit (INDEPENDENT_AMBULATORY_CARE_PROVIDER_SITE_OTHER): Payer: Self-pay | Admitting: Vascular Surgery

## 2019-10-10 ENCOUNTER — Encounter
Admission: EM | Disposition: A | Discharge status: Home Health | Payer: Self-pay | Source: Home / Self Care | Attending: Internal Medicine

## 2019-10-10 DIAGNOSIS — I70249 Atherosclerosis of native arteries of left leg with ulceration of unspecified site: Secondary | ICD-10-CM

## 2019-10-10 HISTORY — PX: LOWER EXTREMITY ANGIOGRAPHY: CATH118251

## 2019-10-10 LAB — GLUCOSE, CAPILLARY
Glucose-Capillary: 134 mg/dL — ABNORMAL HIGH (ref 70–99)
Glucose-Capillary: 122 mg/dL — ABNORMAL HIGH (ref 70–99)
Glucose-Capillary: 109 mg/dL — ABNORMAL HIGH (ref 70–99)
Glucose-Capillary: 107 mg/dL — ABNORMAL HIGH (ref 70–99)

## 2019-10-10 LAB — BASIC METABOLIC PANEL
Anion gap: 9 (ref 5–15)
BUN: 30 mg/dL — ABNORMAL HIGH (ref 8–23)
CO2: 24 mmol/L (ref 22–32)
Calcium: 8.8 mg/dL — ABNORMAL LOW (ref 8.9–10.3)
Chloride: 102 mmol/L (ref 98–111)
Creatinine, Ser: 1.61 mg/dL — ABNORMAL HIGH (ref 0.61–1.24)
GFR calc Af Amer: 51 mL/min — ABNORMAL LOW (ref >60)
GFR calc non Af Amer: 44 mL/min — ABNORMAL LOW (ref >60)
Glucose, Bld: 164 mg/dL — ABNORMAL HIGH (ref 70–99)
Potassium: 3.9 mmol/L (ref 3.5–5.1)
Sodium: 135 mmol/L (ref 135–145)

## 2019-10-10 LAB — SURGICAL PCR SCREEN
MRSA, PCR: POSITIVE — AB
Staphylococcus aureus: POSITIVE — AB

## 2019-10-10 SURGERY — LOWER EXTREMITY ANGIOGRAPHY
Anesthesia: Moderate Sedation | Laterality: Left

## 2019-10-10 MED ORDER — HYDROMORPHONE HCL 1 MG/ML IJ SOLN
1.0000 mg | Freq: Once | INTRAMUSCULAR | Status: AC | PRN
  Administered 2019-10-10: 1 mg via INTRAVENOUS

## 2019-10-10 MED ORDER — DIPHENHYDRAMINE HCL 50 MG/ML IJ SOLN: 50.0000 mg | Freq: Once | INTRAMUSCULAR | Status: DC | PRN

## 2019-10-10 MED ORDER — MIDAZOLAM HCL 5 MG/5ML IJ SOLN
INTRAMUSCULAR | Status: AC
  Filled 2019-10-10: qty 5

## 2019-10-10 MED ORDER — METHYLPREDNISOLONE SODIUM SUCC 125 MG IJ SOLR: 125.0000 mg | Freq: Once | INTRAMUSCULAR | Status: DC | PRN

## 2019-10-10 MED ORDER — HYDROMORPHONE HCL 1 MG/ML IJ SOLN
INTRAMUSCULAR | Status: AC
  Filled 2019-10-10: qty 1

## 2019-10-10 MED ORDER — CLINDAMYCIN PHOSPHATE 300 MG/50ML IV SOLN
INTRAVENOUS | Status: AC
  Administered 2019-10-10: 300 mg
  Filled 2019-10-10: qty 50

## 2019-10-10 MED ORDER — ONDANSETRON HCL 4 MG/2ML IJ SOLN: 4.0000 mg | Freq: Four times a day (QID) | INTRAMUSCULAR | Status: DC | PRN

## 2019-10-10 MED ORDER — HEPARIN SODIUM (PORCINE) 1000 UNIT/ML IJ SOLN
INTRAMUSCULAR | Status: AC
  Filled 2019-10-10: qty 1

## 2019-10-10 MED ORDER — FENTANYL CITRATE (PF) 100 MCG/2ML IJ SOLN
INTRAMUSCULAR | Status: AC
  Filled 2019-10-10: qty 2

## 2019-10-10 MED ORDER — FAMOTIDINE 20 MG PO TABS: 40.0000 mg | ORAL_TABLET | Freq: Once | ORAL | Status: DC | PRN

## 2019-10-10 MED ORDER — CLINDAMYCIN PHOSPHATE 300 MG/50ML IV SOLN
300.0000 mg | Freq: Once | INTRAVENOUS | Status: DC
  Filled 2019-10-10: qty 50

## 2019-10-10 MED ORDER — MUPIROCIN 2 % EX OINT
1.0000 "application " | TOPICAL_OINTMENT | Freq: Two times a day (BID) | CUTANEOUS | Status: DC
  Administered 2019-10-10 – 2019-10-11 (×3): 1 via NASAL
  Filled 2019-10-10: qty 22

## 2019-10-10 MED ORDER — FENTANYL CITRATE (PF) 100 MCG/2ML IJ SOLN
INTRAMUSCULAR | Status: DC | PRN
  Administered 2019-10-10: 50 ug via INTRAVENOUS

## 2019-10-10 MED ORDER — CHLORHEXIDINE GLUCONATE CLOTH 2 % EX PADS
6.0000 | MEDICATED_PAD | Freq: Every day | CUTANEOUS | Status: DC
  Administered 2019-10-10: 6 via TOPICAL

## 2019-10-10 MED ORDER — SODIUM CHLORIDE 0.9 % IV SOLN: INTRAVENOUS | Status: DC

## 2019-10-10 MED ORDER — MIDAZOLAM HCL 2 MG/2ML IJ SOLN
INTRAMUSCULAR | Status: DC | PRN
  Administered 2019-10-10: 2 mg via INTRAVENOUS

## 2019-10-10 MED ORDER — MIDAZOLAM HCL 2 MG/ML PO SYRP: 8.0000 mg | ORAL_SOLUTION | Freq: Once | ORAL | Status: DC | PRN

## 2019-10-10 SURGICAL SUPPLY — 18 items
BALLN LUTONIX 018 5X150X130 (BALLOONS) ×3
BALLN ULTRVRSE 018 2.5X100X150 (BALLOONS) ×3
BALLN ULTRVRSE 3X150X150 (BALLOONS) ×3
BALLOON LUTONIX 018 5X150X130 (BALLOONS) IMPLANT
BALLOON ULTRVRSE 3X150X150 (BALLOONS) ×1 IMPLANT
BALLOON ULTRVS 018 2.5X100X150 (BALLOONS) ×1 IMPLANT
CATH BEACON 5 .038 100 VERT TP (CATHETERS) ×3 IMPLANT
CATH PIG 70CM (CATHETERS) ×3 IMPLANT
DEVICE PRESTO INFLATION (MISCELLANEOUS) ×3 IMPLANT
DEVICE STARCLOSE SE CLOSURE (Vascular Products) ×2 IMPLANT
PACK ANGIOGRAPHY (CUSTOM PROCEDURE TRAY) ×2 IMPLANT
SHEATH BRITE TIP 5FRX11 (SHEATH) ×3 IMPLANT
SHEATH RAABE 6FRX70 (SHEATH) ×3 IMPLANT
SYR MEDRAD MARK 7 150ML (SYRINGE) ×3 IMPLANT
TUBING CONTRAST HIGH PRESS 72 (TUBING) ×3 IMPLANT
WIRE G V18X300CM (WIRE) ×3 IMPLANT
WIRE J 3MM .035X145CM (WIRE) ×3 IMPLANT
WIRE MAGIC TOR.035 180C (WIRE) ×3 IMPLANT

## 2019-10-10 NOTE — Progress Notes (Deleted)
90% lying without O2 86% standing without O2 82% walking without O2  Went 92% when put on 2L  

## 2019-10-10 NOTE — Care Management Important Message (Signed)
Important Message  Patient Details  Name: Jason Montes MRN: [id number] Date of Birth: 16-Nov-1950   Medicare Important Message Given:  Yes     Juliann Pulse A Lavona Norsworthy 10/10/2019, 11:22 AM

## 2019-10-10 NOTE — Progress Notes (Addendum)
Inpatient Diabetes Program Recommendations  AACE/ADA: New Consensus Statement on Inpatient Glycemic Control (2015)  Target Ranges:  Prepandial:   less than 140 mg/dL      Peak postprandial:   less than 180 mg/dL (1-2 hours)      Critically ill patients:  140 - 180 mg/dL   Results for MAXAMUS, PERIC (MRN [id number]) as of 10/10/2019 08:00  Ref. Range 10/09/2019 08:02 10/09/2019 12:10 10/09/2019 16:51 10/09/2019 20:10  Glucose-Capillary Latest Ref Range: 70 - 99 mg/dL 130 (H)  1 unit NOVOLOG  231 (H)  3 units NOVOLOG  302 (H)  7 units NOVOLOG  233 (H)  2 units NOVOLOG    Results for WILLOW, DAHLE (MRN [id number]) as of 10/10/2019 08:00  Ref. Range 03/11/2019 13:02 10/07/2019 19:28  Hemoglobin A1C Latest Ref Range: 4.8 - 5.6 % 6.8 (H) 13.7 (H)  (346 mg/dl)    Admit with: Left diabetic foot ulcer with mild cellulitis left hallux dry gangrene  History: DM, CVA, CKD  Home DM Meds: Jardiance 12.5 mg Daily       Metformin 500 mg BID       Ozempic 1.5 mg Qweek  Current Orders: Novolog Sensitive Correction Scale/ SSI (0-9 units) TID AC + HS      MD- Please consider adding Novolog Meal Coverage to current inpatient insulin regimen while home Diabetes meds are on hold:  Novolog 4 units TID with meals  (Please add the following Hold Parameters: Hold if pt eats <50% of meal, Hold if pt NPO)    Addendum 2:15pm--Spoke w/ pt's RN today.  Per RN, it may be best to call pt's daughter Donah Driver for info on pt as pt having some confusion today and memory impairment per assessment.  I attempted to call Donah Driver but got no answer.  Left voicemail asking her to call me back so I can review pt's A1c and CBGs with her and get some extra info about pt's home diabetes regimen.  Pt's A1c has risen from 6.8% in October 2020 to 13.7%--question if pt being consistent with meds at home??  Will attempt follow up call with Ms. Mateo Flow tomorrow 05/11.      --Will follow patient  during hospitalization--  Wyn Quaker RN, MSN, CDE Diabetes Coordinator Inpatient Glycemic Control Team Team Pager: [phone] (8a-5p)

## 2019-10-10 NOTE — Op Note (Signed)
[facility]  Percutaneous Study/Intervention Procedural Note   Date of Surgery: 10/10/2019  Surgeon(s):Juno Bozard    Assistants:none  Pre-operative Diagnosis: PAD with ulceration LLE  Post-operative diagnosis:  Same  Procedure(s) Performed:             1.  Ultrasound guidance for vascular access right femoral artery             2.  Catheter placement into left common femoral artery from right femoral approach             3.  Aortogram and selective left lower extremity angiogram             4.  Percutaneous transluminal angioplasty of left anterior tibial artery with 3 mm diameter angioplasty balloon             5.   Percutaneous transluminal angioplasty of left peroneal artery with 2.5 mm diameter angioplasty balloon  6.  Percutaneous transluminal angioplasty of left popliteal artery with 5 mm diameter Lutonix drug-coated angioplasty balloon             7.  StarClose closure device right femoral artery  EBL: 5 cc  Contrast: 65 cc  Fluoro Time: 3.7 minutes  Moderate Conscious Sedation Time: approximately 20 minutes using 2 mg of Versed and 50 mcg of Fentanyl              Indications:  Patient is a 69 y.o.male with nonpalpable pedal pulses and a nonhealing ulceration with infection of the left foot with some early gangrenous changes. The patient is brought in for angiography for further evaluation and potential treatment.  Due to the limb threatening nature of the situation, angiogram was performed for attempted limb salvage. The patient is aware that if the procedure fails, amputation would be expected.  The patient also understands that even with successful revascularization, amputation may still be required due to the severity of the situation.  Risks and benefits are discussed and informed consent is obtained.   Procedure:  The patient was identified and appropriate procedural time out was performed.  The patient was then placed supine on the table and  prepped and draped in the usual sterile fashion. Moderate conscious sedation was administered during a face to face encounter with the patient throughout the procedure with my supervision of the RN administering medicines and monitoring the patient's vital signs, pulse oximetry, telemetry and mental status throughout from the start of the procedure until the patient was taken to the recovery room. Ultrasound was used to evaluate the right common femoral artery.  It was patent .  A digital ultrasound image was acquired.  A Seldinger needle was used to access the right common femoral artery under direct ultrasound guidance and a permanent image was performed.  A 0.035 J wire was advanced without resistance and a 5Fr sheath was placed.  Pigtail catheter was placed into the aorta and an AP aortogram was performed. This demonstrated normal aorta and iliac arteries without significant stenosis.  The renal arteries were not well seen but this may have been due to catheter position. I then crossed the aortic bifurcation and advanced to the left femoral head. Selective left lower extremity angiogram was then performed. This demonstrated a relatively normal common femoral artery, profunda femoris artery, and superficial femoral artery with only mild disease.  The popliteal artery had a shark bite lesion just above the level of the knee in the 50 to 60% range and then a narrowing  just above the takeoff anterior tibial artery in the 60 to 70% range.  The anterior tibial artery was patent over the first 4 to 5 cm but then occluded for about 5 cm with reconstitution in the proximal to mid anterior tibial artery was then continuous to the foot.  The peroneal artery had a greater than 80% stenosis in the midsegment that was relatively focal and then was continuous in his normal course to the ankle.  The posterior tibial artery was chronically occluded without distal reconstitution. It was felt that it was in the patient's best  interest to proceed with intervention after these images to avoid a second procedure and a larger amount of contrast and fluoroscopy based off of the findings from the initial angiogram. The patient was systemically heparinized and a 6 [place] Ansell sheath was then placed over the Magic tourque wire. I then used a Kumpe catheter and the V18 wire to get into the origin of the anterior tibial artery perform selective imaging demonstrating the occlusion and using this as a roadmap to cross it.  I then crossed the lesion with a V 18 wire without difficulty and proceeded with treatment.  A 3 mm diameter by 10 cm length angioplasty balloon was then inflated up to 8 atm for 1 minute.  Completion imaging showed less than 10% residual stenosis.  I then got the Kumpe catheter back and the V 18 wire and used this to get down into the peroneal artery and cross the peroneal artery stenosis without difficulty.  A 2.5 mm diameter by 15 cm length angioplasty balloon was then inflated up to 14 atm for 1 minute.  Completion imaging showed about 10 to 15% residual stenosis in the peroneal artery.  I then turned my attention to the popliteal lesion.  A 5 mm diameter by 15 cm length Lutonix drug-coated angioplasty balloon was used to encompass both of the popliteal lesions on 1 inflation.  This was taken up to 8 atm for 1 minute with completion imaging showing about a 30% residual stenosis more proximal lesion and about a 10% residual stenosis in the more distal lesion.  These were not flow-limiting. I elected to terminate the procedure. The sheath was removed and StarClose closure device was deployed in the right femoral artery with excellent hemostatic result. The patient was taken to the recovery room in stable condition having tolerated the procedure well.  Findings:               Aortogram:  Renal arteries not well seen but likely due to catheter position.  Aorta and iliac arteries are widely patent without significant  stenosis             Left lower Extremity:  This demonstrated a relatively normal common femoral artery, profunda femoris artery, and superficial femoral artery with only mild disease.  The popliteal artery had a shark bite lesion just above the level of the knee in the 50 to 60% range and then a narrowing just above the takeoff anterior tibial artery in the 60 to 70% range.  The anterior tibial artery was patent over the first 4 to 5 cm but then occluded for about 5 cm with reconstitution in the proximal to mid anterior tibial artery was then continuous to the foot.  The peroneal artery had a greater than 80% stenosis in the midsegment that was relatively focal and then was continuous in his normal course to the ankle.  The posterior tibial artery was chronically occluded  without distal reconstitution   Disposition: Patient was taken to the recovery room in stable condition having tolerated the procedure well.  Complications: None  Leotis Pain 10/10/2019 6:27 PM   This note was created with Dragon Medical transcription system. Any errors in dictation are purely unintentional.

## 2019-10-10 NOTE — Progress Notes (Signed)
Physical Therapy Treatment Patient Details Name: Jason Montes MRN: [id number] DOB: 08-19-1950 Today's Date: 10/10/2019    History of Present Illness Jason Montes  is a 69 y.o. Caucasian male with a known history of type 2 diabetes mellitus, hypertension, pacemaker, R foot amputation, dyslipidemia, CVA and peripheral vascular disease, who presented to the emergency room 10/07/2019 and admitted to the hospital with left diabetic foot ulcer with cellulitis and gangrenous changes to the left distal big toe. He also has uncontrolled DM, a fib, hyperkalemia, and acute kidney injury. Wound care performed and pt to is partial weightbearing with heel contact in a surgical shoe to the left foot.    PT Comments    Pt is making gradual progress towards goals with ability to ambulate in/around room. Limited distance due to inability to consistently maintain WBing status, increased education provided. Good endurance with HEP. Pt becomes tearful talking about discharge plan. He reports if he can walk to the bathroom indep, that will be sufficient. Practiced transfers with improvement noted. Encouraged to continue transfer practice with RN staff. He reports his daughter doesn't want him in her life and he has nobody to depend on. Will continue to progress as able.   Follow Up Recommendations  Home health PT;Supervision for mobility/OOB     Equipment Recommendations  Rolling walker with 5" wheels;3in1 (PT)    Recommendations for Other Services       Precautions / Restrictions Precautions Precautions: Fall Restrictions Weight Bearing Restrictions: Yes LLE Weight Bearing: Partial weight bearing Other Position/Activity Restrictions: partial weightbearing with heel contact in a surgical shoe to the left foot    Mobility  Bed Mobility Overal bed mobility: Needs Assistance Bed Mobility: Supine to Sit     Supine to sit: Min assist     General bed mobility comments: needs slight upper body  assist. Once seated at EOB, able to demonstrate upright posture. Follows commands well  Transfers Overall transfer level: Needs assistance Equipment used: Rolling walker (2 wheeled) Transfers: Sit to/from Stand Sit to Stand: Min assist         General transfer comment: needs assist for completing transfer. Shoes donned prior to transfer. Once standing, upright posture noted. Practiced ~5-6 transfers with improvement to performance with cga  Ambulation/Gait Ambulation/Gait assistance: Min guard Gait Distance (Feet): 40 Feet Assistive device: Rolling walker (2 wheeled) Gait Pattern/deviations: Step-to pattern     General Gait Details: ambulated in room, limited in distance due to inability to fully maintain WBing precautions. RW used. Does report dizziness, however unable to quanitfy and didn't seem to interfere with ability to [employer]    Modified Rankin (Stroke Patients Only)       Balance Overall balance assessment: Needs assistance Sitting-balance support: Feet supported;No upper extremity supported Sitting balance-Leahy Scale: Normal Sitting balance - Comments: steady sitting in chair   Standing balance support: Bilateral upper extremity supported;During functional activity Standing balance-Leahy Scale: Fair                              Cognition Arousal/Alertness: Awake/alert Behavior During Therapy: WFL for tasks assessed/performed Overall Cognitive Status: History of cognitive impairments - at baseline                                 General Comments:  Has trouble with word finding      Exercises Other Exercises Other Exercises: supine/seated ther-ex on B LE including SLRs, hip abd/add, SAQ, LAQ, and hip add squeezes. All ther-ex performed x 10 reps with supervision.    General Comments        Pertinent Vitals/Pain Pain Assessment: No/denies pain Pain Intervention(s):  Premedicated before session    Home Living                      Prior Function            PT Goals (current goals can now be found in the care plan section) Acute Rehab PT Goals Patient Stated Goal: return home PT Goal Formulation: With patient Time For Goal Achievement: 10/22/19 Potential to Achieve Goals: Good Progress towards PT goals: Progressing toward goals    Frequency    Min 2X/week      PT Plan Discharge plan needs to be updated    Co-evaluation              AM-PAC PT "6 Clicks" Mobility   Outcome Measure  Help needed turning from your back to your side while in a flat bed without using bedrails?: None Help needed moving from lying on your back to sitting on the side of a flat bed without using bedrails?: A Little Help needed moving to and from a bed to a chair (including a wheelchair)?: A Little Help needed standing up from a chair using your arms (e.g., wheelchair or bedside chair)?: A Little Help needed to walk in hospital room?: A Little Help needed climbing 3-5 steps with a railing? : A Little 6 Click Score: 19    End of Session Equipment Utilized During Treatment: Gait belt(post op shoe, and other shoe) Activity Tolerance: Patient tolerated treatment well Patient left: in bed;with bed alarm set Nurse Communication: Mobility status PT Visit Diagnosis: Unsteadiness on feet (R26.81);Difficulty in walking, not elsewhere classified (R26.2)     Time: [id number] PT Time Calculation (min) (ACUTE ONLY): 38 min  Charges:  $Gait Training: 8-22 mins $Therapeutic Exercise: 23-37 mins                     Greggory Stallion, [place], DPT [phone]    Fredric Slabach 10/10/2019, 4:18 PM

## 2019-10-10 NOTE — Consult Note (Deleted)
PODIATRY / FOOT AND ANKLE SURGERY NOTE  Reason for consult: Left hallux infection/gangrene  Chief Complaint: Left hallux wound/infection   HPI: Jason Montes is a 69 y.o. male who presents resting in bed comfortably still awaiting revascularization of the left lower extremity.  Patient states he still has pain to his left big toe and forefoot area.  He denies any other complaints at this time.  PMHx:  Past Medical History:  Diagnosis Date  . Allergy   . Atrial fibrillation ([place])   . B12 deficiency   . Chronic kidney disease   . Chronic kidney disease (CKD), stage II (mild)   . Coronary atherosclerosis   . Diabetes mellitus without complication ([place])   . Elevated PSA   . Esophagitis   . GERD (gastroesophageal reflux disease)   . Heart attack ([place])   . Heart disease   . Hyperlipidemia   . Hypertension   . Hypokalemia   . Mild neurocognitive disorder   . Osteoarthritis   . Presbyopia   . PVD (peripheral vascular disease) ([place])   . Restless leg syndrome   . Stroke (cerebrum) ([place])   . Stroke ([place])   . Subacute osteomyelitis ([place])   . Tinea unguium   . Uncompensated short term memory deficit     Surgical Hx:  Past Surgical History:  Procedure Laterality Date  . CARDIAC PACEMAKER PLACEMENT    . COLONOSCOPY WITH PROPOFOL N/A 05/18/2018   Procedure: COLONOSCOPY WITH PROPOFOL;  Surgeon: [name], MD;  Location: [facility] ENDOSCOPY;  Service: Endoscopy;  Laterality: N/A;  . CORONARY ARTERY BYPASS GRAFT    . FOOT AMPUTATION Right     FHx:  Family History  Problem Relation Age of Onset  . Heart failure Mother   . Colon cancer Mother   . Diabetes Mother   . Thyroid disease Sister     Social History:  reports that he has never smoked. He has never used smokeless tobacco. He reports that he does not drink alcohol or use drugs.  Allergies:  Allergies  Allergen Reactions  . Norvasc [Amlodipine Besylate]   . Penicillins     Childhood allergy, not sure what happens      Review of Systems: General ROS: negative Respiratory ROS: no cough, shortness of breath, or wheezing Cardiovascular ROS: no chest pain or dyspnea on exertion Musculoskeletal ROS: positive for - gait disturbance, joint pain, joint swelling, muscular weakness and swelling in toe - left Neurological ROS: positive for - numbness/tingling and weakness Dermatological ROS: positive for Left big toe wound  Medications Prior to Admission  Medication Sig Dispense Refill  . apixaban (ELIQUIS) 5 MG TABS tablet Take 5 mg by mouth 2 (two) times daily.    Marland Kitchen aspirin EC 81 MG tablet Take 81 mg by mouth daily.    Marland Kitchen atorvastatin (LIPITOR) 40 MG tablet Take 40 mg by mouth at bedtime.     . betamethasone valerate (VALISONE) 0.1 % cream Apply 1 application topically 2 (two) times daily as needed.    . carvedilol (COREG) 3.125 MG tablet Take 3.125 mg by mouth 2 (two) times daily with a meal.    . cetirizine (ZYRTEC) 10 MG tablet Take 10 mg by mouth daily.    . Cholecalciferol (VITAMIN D3) 1000 units CAPS Take 1,000 Units by mouth daily.     . cyanocobalamin (,VITAMIN B-12,) 1000 MCG/ML injection Inject 1,000 mcg into the muscle every 30 (thirty) days.    . cyclobenzaprine (FLEXERIL) 10 MG tablet Take 10 mg  by mouth 2 (two) times daily as needed for muscle spasms.    Marland Kitchen docusate sodium (COLACE) 50 MG capsule Take 100 mg by mouth daily.     . empagliflozin (JARDIANCE) 25 MG TABS tablet Take 12.5 mg by mouth daily.    . famotidine (PEPCID) 20 MG tablet Take 20 mg by mouth 2 (two) times daily.    . fluticasone (FLONASE) 50 MCG/ACT nasal spray Place 2 sprays into both nostrils daily.     Marland Kitchen gabapentin (NEURONTIN) 600 MG tablet Take 600 mg by mouth 2 (two) times daily.     Marland Kitchen glucose 4 GM chewable tablet Chew 4 tablets by mouth as needed for low blood sugar.    . lisinopril (ZESTRIL) 2.5 MG tablet Take 2.5 mg by mouth daily.    . metFORMIN (GLUCOPHAGE) 500 MG tablet Start 1 tab by mouth in am daily for 7 days. Then  increase to 1 tab in am & pm for 7 days.  Continue weekly increase to 2 tabs twice daily. (Patient taking differently: Take 500 mg by mouth 2 (two) times daily. ) 120 tablet 3  . Semaglutide, 1 MG/DOSE, 2 MG/1.5ML SOPN Inject 1.5 mg into the skin once a week.    . tamsulosin (FLOMAX) 0.4 MG CAPS capsule Take 0.4 mg by mouth daily.     Marland Kitchen torsemide (DEMADEX) 20 MG tablet Take 20 mg by mouth daily.     . nitroGLYCERIN (NITROSTAT) 0.4 MG SL tablet Place 0.4 mg under the tongue every 5 (five) minutes as needed for chest pain.      Physical Exam: General: Alert and oriented.  No apparent distress.  Vascular: DP/PT pulses nonpalpable bilateral.  Capillary fill time delayed to the left hallux to the metatarsal phalangeal joint.  No hair growth present to bilateral lower extremities.  Mild to moderate erythema with edema present to the left big toe.  Neuro: Light touch sensation reduced to bilateral lower extremities  Derm: Right foot with no open wounds but has a small callus present to the dorsal lateral aspect of the transmetatarsal amputation site.  Left hallux IPJ plantar medial ulceration appears to be 100% necrotic with eschar present but appears to have more tissue demarcating since last visit with erythema that is now extending proximally to the metatarsal phalangeal joint level area.  No fluctuance palpable.  Concern for worsening necrosis of tissues due to lack of blood supply.  MSK: Right transmetatarsal amputation proximal.  Mild left hallux toe pain.  Results for orders placed or performed during the hospital encounter of 10/07/19 (from the past 48 hour(s))  Glucose, capillary     Status: Abnormal   Collection Time: 10/08/19  9:39 PM  Result Value Ref Range   Glucose-Capillary 340 (H) 70 - 99 mg/dL    Comment: Glucose reference range applies only to samples taken after fasting for at least 8 hours.  Glucose, capillary     Status: Abnormal   Collection Time: 10/09/19  4:22 AM  Result  Value Ref Range   Glucose-Capillary 139 (H) 70 - 99 mg/dL    Comment: Glucose reference range applies only to samples taken after fasting for at least 8 hours.  Glucose, capillary     Status: Abnormal   Collection Time: 10/09/19  8:02 AM  Result Value Ref Range   Glucose-Capillary 130 (H) 70 - 99 mg/dL    Comment: Glucose reference range applies only to samples taken after fasting for at least 8 hours.   Comment 1 Notify  RN    Comment 2 Document in Chart   Glucose, capillary     Status: Abnormal   Collection Time: 10/09/19 12:10 PM  Result Value Ref Range   Glucose-Capillary 231 (H) 70 - 99 mg/dL    Comment: Glucose reference range applies only to samples taken after fasting for at least 8 hours.   Comment 1 Notify RN    Comment 2 Document in Chart   Glucose, capillary     Status: Abnormal   Collection Time: 10/09/19  4:51 PM  Result Value Ref Range   Glucose-Capillary 302 (H) 70 - 99 mg/dL    Comment: Glucose reference range applies only to samples taken after fasting for at least 8 hours.   Comment 1 Notify RN    Comment 2 Document in Chart   Glucose, capillary     Status: Abnormal   Collection Time: 10/09/19  8:10 PM  Result Value Ref Range   Glucose-Capillary 233 (H) 70 - 99 mg/dL    Comment: Glucose reference range applies only to samples taken after fasting for at least 8 hours.   Comment 1 Notify RN   Basic metabolic panel     Status: Abnormal   Collection Time: 10/10/19  4:01 AM  Result Value Ref Range   Sodium 135 135 - 145 mmol/L   Potassium 3.9 3.5 - 5.1 mmol/L   Chloride 102 98 - 111 mmol/L   CO2 24 22 - 32 mmol/L   Glucose, Bld 164 (H) 70 - 99 mg/dL    Comment: Glucose reference range applies only to samples taken after fasting for at least 8 hours.   BUN 30 (H) 8 - 23 mg/dL   Creatinine, Ser 1.61 (H) 0.61 - 1.24 mg/dL   Calcium 8.8 (L) 8.9 - 10.3 mg/dL   GFR calc non Af Amer 44 (L) >60 mL/min   GFR calc Af Amer 51 (L) >60 mL/min   Anion gap 9 5 - 15     Comment: Performed at [facility], [address] [place], [place] [address]  Surgical PCR screen     Status: Abnormal   Collection Time: 10/10/19  4:46 AM   Specimen: Nasal Mucosa; Nasal Swab  Result Value Ref Range   MRSA, PCR POSITIVE (A) NEGATIVE    Comment: RESULT CALLED TO, READ BACK BY AND VERIFIED WITH: KASEY VAUGHANS WARD AT [phone] ON 10/10/2019 [place].    Staphylococcus aureus POSITIVE (A) NEGATIVE    Comment: (NOTE) The Xpert SA Assay (FDA approved for NASAL specimens in patients 53 years of age and older), is one component of a comprehensive surveillance program. It is not intended to diagnose infection nor to guide or monitor treatment. Performed at [facility], [address] [place], [place] [address]    No results found.  Blood pressure 119/60, pulse 75, temperature 98 F (36.7 C), temperature source Oral, resp. rate 18, height 5\' 9"  (1.753 m), weight 81.3 kg, SpO2 99 %.  Assessment 1. Left hallux gangrene and first MTPJ, dry - demarcating 2. Cellulitis left hallux 3. PVD 4. Diabetes type 2 with polyneuropathy  Plan -Patient seen and examined. -Left hallux ulceration appears to be worsening with further necrosis and demarcation now extending to the first metatarsal phalangeal joint area with associated erythema that is reduced with elevation of the foot indicating rubor with dependency. -Discussed with patient that his blood flow to his foot into the left big toe was compromised which is likely led to the gangrenous changes  that is seen today. -Applied Betadine gauze to the wound followed by 4 x 4's, Kerlix, Ace wrap with no compression.  Continue daily changes. -Patient to be partial weightbearing with heel contact in a surgical shoe to the left foot. -Vascular planning CT angiogram with intervention.  Concerned that patient does not have enough blood flow to heal any amputation at this time.  Vascular planning for procedure today. -Wound  culture growing staph aureus, sensitivities pending.  Patient has a positive MRSA nare culture.  Appreciate recommendations for antibiotics per medicine.  White blood cell count within normal limits, patient is afebrile vital signs stable. -Discussed with patient that there is worsening signs of necrosis to tissues and we need to allow the tissues to demarcate further in order to determine potential margin of amputation.  Discussed with patient that we would like to have the vascular procedure performed first and then wait potentially several days until the tissues demarcate further.  We may be able to wait to amputate in outpatient setting after demarcation has occurred.  Caroline More, DPM 10/10/2019, 1:00 PM

## 2019-10-10 NOTE — Progress Notes (Signed)
PODIATRY / FOOT AND ANKLE SURGERY PROGRESS NOTE  Reason for consult: Left hallux infection/gangrene  Chief Complaint: Left hallux wound/infection   HPI: Jason Montes is a 69 y.o. male who presents resting in bed comfortably still awaiting revascularization of the left lower extremity.  Patient states he still has pain to his left big toe and forefoot area.  He denies any other complaints at this time.  PMHx:  Past Medical History:  Diagnosis Date  . Allergy   . Atrial fibrillation ([place])   . B12 deficiency   . Chronic kidney disease   . Chronic kidney disease (CKD), stage II (mild)   . Coronary atherosclerosis   . Diabetes mellitus without complication ([place])   . Elevated PSA   . Esophagitis   . GERD (gastroesophageal reflux disease)   . Heart attack ([place])   . Heart disease   . Hyperlipidemia   . Hypertension   . Hypokalemia   . Mild neurocognitive disorder   . Osteoarthritis   . Presbyopia   . PVD (peripheral vascular disease) ([place])   . Restless leg syndrome   . Stroke (cerebrum) ([place])   . Stroke ([place])   . Subacute osteomyelitis ([place])   . Tinea unguium   . Uncompensated short term memory deficit     Surgical Hx:  Past Surgical History:  Procedure Laterality Date  . CARDIAC PACEMAKER PLACEMENT    . COLONOSCOPY WITH PROPOFOL N/A 05/18/2018   Procedure: COLONOSCOPY WITH PROPOFOL;  Surgeon: [name], MD;  Location[facility] ENDOSCOPY;  Service: Endoscopy;  Laterality: N/A;  . CORONARY ARTERY BYPASS GRAFT    . FOOT AMPUTATION Right     FHx:  Family History  Problem Relation Age of Onset  . Heart failure Mother   . Colon cancer Mother   . Diabetes Mother   . Thyroid disease Sister     Social History:  reports that he has never smoked. He has never used smokeless tobacco. He reports that he does not drink alcohol or use drugs.  Allergies:  Allergies  Allergen Reactions  . Norvasc [Amlodipine Besylate]   . Penicillins     Childhood allergy, not sure what  happens     Review of Systems: General ROS: negative Respiratory ROS: no cough, shortness of breath, or wheezing Cardiovascular ROS: no chest pain or dyspnea on exertion Musculoskeletal ROS: positive for - gait disturbance, joint pain, joint swelling, muscular weakness and swelling in toe - left Neurological ROS: positive for - numbness/tingling and weakness Dermatological ROS: positive for Left big toe wound  Medications Prior to Admission  Medication Sig Dispense Refill  . apixaban (ELIQUIS) 5 MG TABS tablet Take 5 mg by mouth 2 (two) times daily.    Marland Kitchen aspirin EC 81 MG tablet Take 81 mg by mouth daily.    Marland Kitchen atorvastatin (LIPITOR) 40 MG tablet Take 40 mg by mouth at bedtime.     . betamethasone valerate (VALISONE) 0.1 % cream Apply 1 application topically 2 (two) times daily as needed.    . carvedilol (COREG) 3.125 MG tablet Take 3.125 mg by mouth 2 (two) times daily with a meal.    . cetirizine (ZYRTEC) 10 MG tablet Take 10 mg by mouth daily.    . Cholecalciferol (VITAMIN D3) 1000 units CAPS Take 1,000 Units by mouth daily.     . cyanocobalamin (,VITAMIN B-12,) 1000 MCG/ML injection Inject 1,000 mcg into the muscle every 30 (thirty) days.    . cyclobenzaprine (FLEXERIL) 10 MG tablet Take 10  mg by mouth 2 (two) times daily as needed for muscle spasms.    Marland Kitchen docusate sodium (COLACE) 50 MG capsule Take 100 mg by mouth daily.     . empagliflozin (JARDIANCE) 25 MG TABS tablet Take 12.5 mg by mouth daily.    . famotidine (PEPCID) 20 MG tablet Take 20 mg by mouth 2 (two) times daily.    . fluticasone (FLONASE) 50 MCG/ACT nasal spray Place 2 sprays into both nostrils daily.     Marland Kitchen gabapentin (NEURONTIN) 600 MG tablet Take 600 mg by mouth 2 (two) times daily.     Marland Kitchen glucose 4 GM chewable tablet Chew 4 tablets by mouth as needed for low blood sugar.    . lisinopril (ZESTRIL) 2.5 MG tablet Take 2.5 mg by mouth daily.    . metFORMIN (GLUCOPHAGE) 500 MG tablet Start 1 tab by mouth in am daily for 7  days. Then increase to 1 tab in am & pm for 7 days.  Continue weekly increase to 2 tabs twice daily. (Patient taking differently: Take 500 mg by mouth 2 (two) times daily. ) 120 tablet 3  . Semaglutide, 1 MG/DOSE, 2 MG/1.5ML SOPN Inject 1.5 mg into the skin once a week.    . tamsulosin (FLOMAX) 0.4 MG CAPS capsule Take 0.4 mg by mouth daily.     Marland Kitchen torsemide (DEMADEX) 20 MG tablet Take 20 mg by mouth daily.     . nitroGLYCERIN (NITROSTAT) 0.4 MG SL tablet Place 0.4 mg under the tongue every 5 (five) minutes as needed for chest pain.      Physical Exam: General: Alert and oriented.  No apparent distress.  Vascular: DP/PT pulses nonpalpable bilateral.  Capillary fill time delayed to the left hallux to the metatarsal phalangeal joint.  No hair growth present to bilateral lower extremities.  Mild to moderate erythema with edema present to the left big toe.  Neuro: Light touch sensation reduced to bilateral lower extremities  Derm: Right foot with no open wounds but has a small callus present to the dorsal lateral aspect of the transmetatarsal amputation site.  Left hallux IPJ plantar medial ulceration appears to be 100% necrotic with eschar present but appears to have more tissue demarcating since last visit with erythema that is now extending proximally to the metatarsal phalangeal joint level area.  No fluctuance palpable.  Concern for worsening necrosis of tissues due to lack of blood supply.  MSK: Right transmetatarsal amputation proximal.  Mild left hallux toe pain.  Results for orders placed or performed during the hospital encounter of 10/07/19 (from the past 48 hour(s))  Glucose, capillary     Status: Abnormal   Collection Time: 10/08/19  9:39 PM  Result Value Ref Range   Glucose-Capillary 340 (H) 70 - 99 mg/dL    Comment: Glucose reference range applies only to samples taken after fasting for at least 8 hours.  Glucose, capillary     Status: Abnormal   Collection Time: 10/09/19  4:22  AM  Result Value Ref Range   Glucose-Capillary 139 (H) 70 - 99 mg/dL    Comment: Glucose reference range applies only to samples taken after fasting for at least 8 hours.  Glucose, capillary     Status: Abnormal   Collection Time: 10/09/19  8:02 AM  Result Value Ref Range   Glucose-Capillary 130 (H) 70 - 99 mg/dL    Comment: Glucose reference range applies only to samples taken after fasting for at least 8 hours.   Comment 1  Notify RN    Comment 2 Document in Chart   Glucose, capillary     Status: Abnormal   Collection Time: 10/09/19 12:10 PM  Result Value Ref Range   Glucose-Capillary 231 (H) 70 - 99 mg/dL    Comment: Glucose reference range applies only to samples taken after fasting for at least 8 hours.   Comment 1 Notify RN    Comment 2 Document in Chart   Glucose, capillary     Status: Abnormal   Collection Time: 10/09/19  4:51 PM  Result Value Ref Range   Glucose-Capillary 302 (H) 70 - 99 mg/dL    Comment: Glucose reference range applies only to samples taken after fasting for at least 8 hours.   Comment 1 Notify RN    Comment 2 Document in Chart   Glucose, capillary     Status: Abnormal   Collection Time: 10/09/19  8:10 PM  Result Value Ref Range   Glucose-Capillary 233 (H) 70 - 99 mg/dL    Comment: Glucose reference range applies only to samples taken after fasting for at least 8 hours.   Comment 1 Notify RN   Basic metabolic panel     Status: Abnormal   Collection Time: 10/10/19  4:01 AM  Result Value Ref Range   Sodium 135 135 - 145 mmol/L   Potassium 3.9 3.5 - 5.1 mmol/L   Chloride 102 98 - 111 mmol/L   CO2 24 22 - 32 mmol/L   Glucose, Bld 164 (H) 70 - 99 mg/dL    Comment: Glucose reference range applies only to samples taken after fasting for at least 8 hours.   BUN 30 (H) 8 - 23 mg/dL   Creatinine, Ser 1.61 (H) 0.61 - 1.24 mg/dL   Calcium 8.8 (L) 8.9 - 10.3 mg/dL   GFR calc non Af Amer 44 (L) >60 mL/min   GFR calc Af Amer 51 (L) >60 mL/min   Anion gap 9 5 -  15    Comment: Performed at [facility], [address] [place],  [address]  Surgical PCR screen     Status: Abnormal   Collection Time: 10/10/19  4:46 AM   Specimen: Nasal Mucosa; Nasal Swab  Result Value Ref Range   MRSA, PCR POSITIVE (A) NEGATIVE    Comment: RESULT CALLED TO, READ BACK BY AND VERIFIED WITH: KASEY VAUGHANS WARD AT [id number] ON 10/10/2019 [place].    Staphylococcus aureus POSITIVE (A) NEGATIVE    Comment: (NOTE) The Xpert SA Assay (FDA approved for NASAL specimens in patients 71 years of age and older), is one component of a comprehensive surveillance program. It is not intended to diagnose infection nor to guide or monitor treatment. Performed at [facility], [address] [place],  [address]    No results found.  Blood pressure 119/60, pulse 75, temperature 98 F (36.7 C), temperature source Oral, resp. rate 18, height 5\' 9"  (1.753 m), weight 81.3 kg, SpO2 99 %.  Assessment 1. Left hallux gangrene and first MTPJ, dry - demarcating 2. Cellulitis left hallux 3. PVD 4. Diabetes type 2 with polyneuropathy  Plan -Patient seen and examined. -Left hallux ulceration appears to be worsening with further necrosis and demarcation now extending to the first metatarsal phalangeal joint area with associated erythema that is reduced with elevation of the foot indicating rubor with dependency. -Discussed with patient that his blood flow to his foot into the left big toe was compromised which is likely led to the gangrenous  changes that is seen today. -Applied Betadine gauze to the wound followed by 4 x 4's, Kerlix, Ace wrap with no compression.  Continue daily changes. -Patient to be partial weightbearing with heel contact in a surgical shoe to the left foot. -Vascular planning CT angiogram with intervention.  Concerned that patient does not have enough blood flow to heal any amputation at this time.  Vascular planning for procedure today. -Wound  culture growing staph aureus, sensitivities pending.  Patient has a positive MRSA nare culture.  Appreciate recommendations for antibiotics per medicine.  White blood cell count within normal limits, patient is afebrile vital signs stable. -Discussed with patient that there is worsening signs of necrosis to tissues and we need to allow the tissues to demarcate further in order to determine potential margin of amputation.  Discussed with patient that we would like to have the vascular procedure performed first and then wait potentially several days until the tissues demarcate further.  We may be able to wait to amputate in outpatient setting after demarcation has occurred.  Caroline More, DPM 10/10/2019, 1:12 PM

## 2019-10-10 NOTE — Progress Notes (Signed)
[facility] NAME: Jason Montes    MR#:  [id number]  DATE OF BIRTH:  02-Jul-1950  SUBJECTIVE:  patient came in with increasing redness and puffiness of the left great toe since last Thursday.   New complaints. Awaiting angiogram procedure today as per plan by vascular REVIEW OF SYSTEMS:   Review of Systems  Constitutional: Negative for chills, fever and weight loss.  HENT: Negative for ear discharge, ear pain and nosebleeds.   Eyes: Negative for blurred vision, pain and discharge.  Respiratory: Negative for sputum production, shortness of breath, wheezing and stridor.   Cardiovascular: Negative for chest pain, palpitations, orthopnea and PND.  Gastrointestinal: Negative for abdominal pain, diarrhea, nausea and vomiting.  Genitourinary: Negative for frequency and urgency.  Musculoskeletal: Negative for back pain and joint pain.  Skin:       Left great toe redness  Neurological: Negative for sensory change, speech change, focal weakness and weakness.  Psychiatric/Behavioral: Negative for depression and hallucinations. The patient is not nervous/anxious.    Tolerating Diet:yes Tolerating PT: HHPT  DRUG ALLERGIES:   Allergies  Allergen Reactions  . Norvasc [Amlodipine Besylate]   . Penicillins     Childhood allergy, not sure what happens     VITALS:  Blood pressure 119/60, pulse 75, temperature 98 F (36.7 C), temperature source Oral, resp. rate 18, height 5\' 9"  (1.753 m), weight 81.3 kg, SpO2 99 %.  PHYSICAL EXAMINATION:   Physical Exam  GENERAL:  69 y.o.-year-old patient lying in the bed with no acute distress.  EYES: Pupils equal, round, reactive to light and accommodation. No scleral icterus.   HEENT: Head atraumatic, normocephalic. Oropharynx and nasopharynx clear.  NECK:  Supple, no jugular venous distention. No thyroid enlargement, no tenderness.  LUNGS: Normal breath sounds bilaterally, no wheezing, rales,  rhonchi. No use of accessory muscles of respiration.  CARDIOVASCULAR: S1, S2 normal. No murmurs, rubs, or gallops.  ABDOMEN: Soft, nontender, nondistended. Bowel sounds present. No organomegaly or mass.  EXTREMITIES: No cyanosis, clubbing or edema b/l.    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  SKIN: No obvious rash, lesion, or ulcer.   LABORATORY PANEL:  CBC Recent Labs  Lab 10/08/19 0418  WBC 9.1  HGB 9.8*  HCT 29.2*  PLT PLATELET CLUMPS NOTED ON SMEAR, UNABLE TO ESTIMATE    Chemistries  Recent Labs  Lab 10/07/19 1734 10/08/19 0418 10/10/19 0401  NA 127*   < > 135  K 5.5*   < > 3.9  CL 92*   < > 102  CO2 20*   < > 24  GLUCOSE 334*   < > 164*  BUN 35*   < > 30*  CREATININE 1.90*   < > 1.61*  CALCIUM 9.2   < > 8.8*  AST 25  --   --   ALT 17  --   --   ALKPHOS 79  --   --   BILITOT 1.3*  --   --    < > = values in this interval not displayed.   Cardiac Enzymes No results for input(s): TROPONINI in the last 168 hours. RADIOLOGY:  No results found. ASSESSMENT AND PLAN:  Jason Montes  is a 69 y.o. Caucasian male with a known history of type 2 diabetes mellitus, hypertension, dyslipidemia, CVA and  peripheral vascular disease, who presented to the emergency room with acute onset of increased redness and puffiness in the left big toe extending to the left foot that started on Thursday and get pale white on Thursday.   1.  Left diabetic foot ulcer with mild cellulitis left hallux dry gangrene  -continue IV cefepime given his penicillin allergy. -Appreciate podiatry consultation with Dr. Luana Shu-- bedside debridement done. Recommends partial weight bearing with heel contact and surgical shoe. PT ordered. -Consultation with vascular surgery. Dr. Trula Slade aware-- given his history of PVD and  concern for gangrenous changes-- Angiogram to be done by vascular -WC -- few GPC and GNR. Will change to po in am  2. Uncontrolled type 2 diabetes mellitus with hyperglycemia and polyneuropathy and nephropathy CKD stage IIIa -on supplement coverage with NovoLog.  Glucophage will be held for now. - continue Neurontin. -HbA1c is 13.7 -pt's home meds Metformin, Ozempic and Jardiance on hold. -He will need regular f/u with PCP to adjust meds along with dietary restritics  3.  Hyperkalemia-resolved -K 5.5-->3.7  4.  Acute on chronic kidney injury IIIA with Diabetic nephropathy --cont IV normal saline and will follow his BMP.   --We will hold off lisinopril, metformin -- Baseline creatinine 1.3--1.4 -came in with creatinine of 1.9-- IV fluids-- 1.6  5.  Atrial fibrillation. -continue Eliquis and Coreg.  6.  Hypertension. -on  Coreg and hold off lisinopril given acute kidney injury.  7.  Dyslipidemia. -continue statin therapy.  8.  BPH. -continue Flomax.  9.  DVT prophylaxis. -Subcutaneous Lovenox    Procedures: bedside debridement left great toe by Dr Child psychotherapist Family communication : left message for [employer] franklin--dter Consults : podiatry, vascular CODE STATUS: full DVT Prophylaxis : Lovenox  Status is: Inpatient  Remains inpatient appropriate because:Ongoing diagnostic testing needed not appropriate for outpatient work up  patient may need vascular workup with CT angiogram lower extremity for his right great toe dry gangrene/mild cellulitis. Currently getting IV antibiotics.   Dispo: The patient is from: Home              Anticipated d/c is to: Home              Anticipated d/c date is: likely 5/11              Patient currently is not medically stable to d/c.  TOTAL TIME TAKING CARE OF THIS PATIENT: *25* minutes.  >50% time spent on counselling and coordination of care  Note: This dictation was prepared with Dragon dictation along with smaller phrase  technology. Any transcriptional errors that result from this process are unintentional.  Fritzi Mandes M.D    [facility]   CC: Primary care physician; [facility]Patient ID: Jason Montes, male   DOB: 06-Jul-1950, 69 y.o.   MRN: [id number]

## 2019-10-11 ENCOUNTER — Inpatient Hospital Stay: Payer: Medicare Other

## 2019-10-11 ENCOUNTER — Encounter: Payer: Self-pay | Admitting: Cardiology

## 2019-10-11 DIAGNOSIS — L089 Local infection of the skin and subcutaneous tissue, unspecified: Secondary | ICD-10-CM

## 2019-10-11 DIAGNOSIS — E11628 Type 2 diabetes mellitus with other skin complications: Secondary | ICD-10-CM

## 2019-10-11 LAB — MAGNESIUM: Magnesium: 2.4 mg/dL (ref 1.7–2.4)

## 2019-10-11 LAB — BASIC METABOLIC PANEL
Anion gap: 10 (ref 5–15)
BUN: 24 mg/dL — ABNORMAL HIGH (ref 8–23)
CO2: 22 mmol/L (ref 22–32)
Calcium: 9 mg/dL (ref 8.9–10.3)
Chloride: 106 mmol/L (ref 98–111)
Creatinine, Ser: 1.19 mg/dL (ref 0.61–1.24)
GFR calc Af Amer: >60 mL/min (ref >60)
GFR calc non Af Amer: >60 mL/min (ref >60)
Glucose, Bld: 90 mg/dL (ref 70–99)
Potassium: 4.4 mmol/L (ref 3.5–5.1)
Sodium: 138 mmol/L (ref 135–145)

## 2019-10-11 LAB — GLUCOSE, CAPILLARY
Glucose-Capillary: 80 mg/dL (ref 70–99)
Glucose-Capillary: 208 mg/dL — ABNORMAL HIGH (ref 70–99)

## 2019-10-11 LAB — CBC
HCT: 31.6 % — ABNORMAL LOW (ref 39.0–52.0)
Hemoglobin: 10 g/dL — ABNORMAL LOW (ref 13.0–17.0)
MCH: 29.1 pg (ref 26.0–34.0)
MCHC: 31.6 g/dL (ref 30.0–36.0)
MCV: 91.9 fL (ref 80.0–100.0)
Platelets: 114 10*3/uL — ABNORMAL LOW (ref 150–400)
RBC: 3.44 MIL/uL — ABNORMAL LOW (ref 4.22–5.81)
RDW: 13.8 % (ref 11.5–15.5)
WBC: 7.4 10*3/uL (ref 4.0–10.5)
nRBC: 0 % (ref 0.0–0.2)

## 2019-10-11 MED ORDER — DOXYCYCLINE HYCLATE 100 MG PO TABS
100.0000 mg | ORAL_TABLET | Freq: Two times a day (BID) | ORAL | Status: DC
  Administered 2019-10-11: 100 mg via ORAL
  Filled 2019-10-11: qty 1

## 2019-10-11 MED ORDER — DOXYCYCLINE HYCLATE 100 MG PO TABS: 100.0000 mg | ORAL_TABLET | Freq: Two times a day (BID) | ORAL | 0 refills | Status: AC

## 2019-10-11 NOTE — Progress Notes (Signed)
PODIATRY / FOOT AND ANKLE SURGERY PROGRESS NOTE  Reason for consult: Left hallux infection/gangrene  Chief Complaint: Left hallux wound/infection   HPI: Jason Montes is a 69 y.o. male who presents resting in bed comfortably.  Family is also present in the room today.  Patient states that he is not feeling like himself today and states that he feels mad but is not really sure why.  Daughter states also that the patient yelled at her which is very unlike him.  Family's concern for stroke.  Patient recently had advanced imaging performed and they are still awaiting the result.  Patient underwent revascularization procedure to left lower extremity.  Patient states he still has pain to his left big toe and forefoot area.  He denies any other complaints at this time.  PMHx:  Past Medical History:  Diagnosis Date  . Allergy   . Atrial fibrillation ([place])   . B12 deficiency   . Chronic kidney disease   . Chronic kidney disease (CKD), stage II (mild)   . Coronary atherosclerosis   . Diabetes mellitus without complication ([place])   . Elevated PSA   . Esophagitis   . GERD (gastroesophageal reflux disease)   . Heart attack ([place])   . Heart disease   . Hyperlipidemia   . Hypertension   . Hypokalemia   . Mild neurocognitive disorder   . Osteoarthritis   . Presbyopia   . PVD (peripheral vascular disease) ([place])   . Restless leg syndrome   . Stroke (cerebrum) ([place])   . Stroke ([place])   . Subacute osteomyelitis ([place])   . Tinea unguium   . Uncompensated short term memory deficit     Surgical Hx:  Past Surgical History:  Procedure Laterality Date  . CARDIAC PACEMAKER PLACEMENT    . COLONOSCOPY WITH PROPOFOL N/A 05/18/2018   Procedure: COLONOSCOPY WITH PROPOFOL;  Surgeon: [name], MD;  [facility] END[facility]COPY;  Service: Endoscopy;  Laterality: N/A;  . CORONARY ARTERY BYPASS GRAFT    . FOOT AMPUTATION Right   . LOWER EXTREMITY ANGIOGRAPHY Left 10/10/2019   Procedure: Lower Extremity  Angiography;  Surgeon: Algernon Huxley, MD;  Location: [facility] CV LAB;  Service: Cardiovascular;  Laterality: Left;    FHx:  Family History  Problem Relation Age of Onset  . Heart failure Mother   . Colon cancer Mother   . Diabetes Mother   . Thyroid disease Sister     Social History:  reports that he has never smoked. He has never used smokeless tobacco. He reports that he does not drink alcohol or use drugs.  Allergies:  Allergies  Allergen Reactions  . Norvasc [Amlodipine Besylate]   . Penicillins     Childhood allergy, not sure what happens     Review of Systems: General ROS: negative Respiratory ROS: no cough, shortness of breath, or wheezing Cardiovascular ROS: no chest pain or dyspnea on exertion Musculoskeletal ROS: positive for - gait disturbance, joint pain, joint swelling, muscular weakness and swelling in toe - left Neurological ROS: positive for - numbness/tingling and weakness Dermatological ROS: positive for Left big toe wound  Medications Prior to Admission  Medication Sig Dispense Refill  . apixaban (ELIQUIS) 5 MG TABS tablet Take 5 mg by mouth 2 (two) times daily.    Marland Kitchen aspirin EC 81 MG tablet Take 81 mg by mouth daily.    Marland Kitchen atorvastatin (LIPITOR) 40 MG tablet Take 40 mg by mouth at bedtime.     . betamethasone valerate (  VALISONE) 0.1 % cream Apply 1 application topically 2 (two) times daily as needed.    . carvedilol (COREG) 3.125 MG tablet Take 3.125 mg by mouth 2 (two) times daily with a meal.    . cetirizine (ZYRTEC) 10 MG tablet Take 10 mg by mouth daily.    . Cholecalciferol (VITAMIN D3) 1000 units CAPS Take 1,000 Units by mouth daily.     . cyanocobalamin (,VITAMIN B-12,) 1000 MCG/ML injection Inject 1,000 mcg into the muscle every 30 (thirty) days.    . cyclobenzaprine (FLEXERIL) 10 MG tablet Take 10 mg by mouth 2 (two) times daily as needed for muscle spasms.    Marland Kitchen docusate sodium (COLACE) 50 MG capsule Take 100 mg by mouth daily.     .  empagliflozin (JARDIANCE) 25 MG TABS tablet Take 12.5 mg by mouth daily.    . famotidine (PEPCID) 20 MG tablet Take 20 mg by mouth 2 (two) times daily.    . fluticasone (FLONASE) 50 MCG/ACT nasal spray Place 2 sprays into both nostrils daily.     Marland Kitchen gabapentin (NEURONTIN) 600 MG tablet Take 600 mg by mouth 2 (two) times daily.     Marland Kitchen glucose 4 GM chewable tablet Chew 4 tablets by mouth as needed for low blood sugar.    . lisinopril (ZESTRIL) 2.5 MG tablet Take 2.5 mg by mouth daily.    . metFORMIN (GLUCOPHAGE) 500 MG tablet Start 1 tab by mouth in am daily for 7 days. Then increase to 1 tab in am & pm for 7 days.  Continue weekly increase to 2 tabs twice daily. (Patient taking differently: Take 500 mg by mouth 2 (two) times daily. ) 120 tablet 3  . Semaglutide, 1 MG/DOSE, 2 MG/1.5ML SOPN Inject 1.5 mg into the skin once a week.    . tamsulosin (FLOMAX) 0.4 MG CAPS capsule Take 0.4 mg by mouth daily.     Marland Kitchen torsemide (DEMADEX) 20 MG tablet Take 20 mg by mouth daily.     . nitroGLYCERIN (NITROSTAT) 0.4 MG SL tablet Place 0.4 mg under the tongue every 5 (five) minutes as needed for chest pain.      Physical Exam: General: Alert and oriented.  No apparent distress.  Vascular: DP/PT pulses nonpalpable bilateral.  Capillary fill time delayed to the left hallux to the metatarsal phalangeal joint, appears to be intact slightly proximal to the first metatarsal phalangeal joint.  No hair growth present to bilateral lower extremities.  Mild to moderate erythema with edema present to the left big toe with no proximal extension.  Neuro: Light touch sensation reduced to bilateral lower extremities  Derm: Right foot with no open wounds but has a small callus present to the dorsal lateral aspect of the transmetatarsal amputation site.  Left hallux IPJ plantar medial ulceration appears to be 100% necrotic with eschar present but appears to have more tissue demarcating since last visit with erythema that is now  extending proximally to the metatarsal phalangeal joint level area.  No fluctuance palpable.   MSK: Right transmetatarsal amputation proximal.  Mild left hallux toe pain.  Results for orders placed or performed during the hospital encounter of 10/07/19 (from the past 48 hour(s))  Glucose, capillary     Status: Abnormal   Collection Time: 10/09/19  4:51 PM  Result Value Ref Range   Glucose-Capillary 302 (H) 70 - 99 mg/dL    Comment: Glucose reference range applies only to samples taken after fasting for at least 8 hours.  Comment 1 Notify RN    Comment 2 Document in Chart   Glucose, capillary     Status: Abnormal   Collection Time: 10/09/19  8:10 PM  Result Value Ref Range   Glucose-Capillary 233 (H) 70 - 99 mg/dL    Comment: Glucose reference range applies only to samples taken after fasting for at least 8 hours.   Comment 1 Notify RN   Basic metabolic panel     Status: Abnormal   Collection Time: 10/10/19  4:01 AM  Result Value Ref Range   Sodium 135 135 - 145 mmol/L   Potassium 3.9 3.5 - 5.1 mmol/L   Chloride 102 98 - 111 mmol/L   CO2 24 22 - 32 mmol/L   Glucose, Bld 164 (H) 70 - 99 mg/dL    Comment: Glucose reference range applies only to samples taken after fasting for at least 8 hours.   BUN 30 (H) 8 - 23 mg/dL   Creatinine, Ser 1.61 (H) 0.61 - 1.24 mg/dL   Calcium 8.8 (L) 8.9 - 10.3 mg/dL   GFR calc non Af Amer 44 (L) >60 mL/min   GFR calc Af Amer 51 (L) >60 mL/min   Anion gap 9 5 - 15    Comment: Performed at [facility], [address] [place], [place] [address]  Surgical PCR screen     Status: Abnormal   Collection Time: 10/10/19  4:46 AM   Specimen: Nasal Mucosa; Nasal Swab  Result Value Ref Range   MRSA, PCR POSITIVE (A) NEGATIVE    Comment: RESULT CALLED TO, READ BACK BY AND VERIFIED WITH: KASEY VAUGHANS WARD AT [phone] ON 10/10/2019 [place].    Staphylococcus aureus POSITIVE (A) NEGATIVE    Comment: (NOTE) The Xpert SA Assay (FDA approved for NASAL  specimens in patients 21 years of age and older), is one component of a comprehensive surveillance program. It is not intended to diagnose infection nor to guide or monitor treatment. Performed at [facility], [address] [place], [place] [address]   Glucose, capillary     Status: Abnormal   Collection Time: 10/10/19  7:56 AM  Result Value Ref Range   Glucose-Capillary 134 (H) 70 - 99 mg/dL    Comment: Glucose reference range applies only to samples taken after fasting for at least 8 hours.  Glucose, capillary     Status: Abnormal   Collection Time: 10/10/19 12:04 PM  Result Value Ref Range   Glucose-Capillary 122 (H) 70 - 99 mg/dL    Comment: Glucose reference range applies only to samples taken after fasting for at least 8 hours.  Glucose, capillary     Status: Abnormal   Collection Time: 10/10/19  4:06 PM  Result Value Ref Range   Glucose-Capillary 109 (H) 70 - 99 mg/dL    Comment: Glucose reference range applies only to samples taken after fasting for at least 8 hours.  Glucose, capillary     Status: Abnormal   Collection Time: 10/10/19  9:02 PM  Result Value Ref Range   Glucose-Capillary 107 (H) 70 - 99 mg/dL    Comment: Glucose reference range applies only to samples taken after fasting for at least 8 hours.   Comment 1 Notify RN   Basic metabolic panel     Status: Abnormal   Collection Time: 10/11/19  4:39 AM  Result Value Ref Range   Sodium 138 135 - 145 mmol/L   Potassium 4.4 3.5 - 5.1 mmol/L   Chloride 106 98 -  111 mmol/L   CO2 22 22 - 32 mmol/L   Glucose, Bld 90 70 - 99 mg/dL    Comment: Glucose reference range applies only to samples taken after fasting for at least 8 hours.   BUN 24 (H) 8 - 23 mg/dL   Creatinine, Ser 1.19 0.61 - 1.24 mg/dL   Calcium 9.0 8.9 - 10.3 mg/dL   GFR calc non Af Amer >60 >60 mL/min   GFR calc Af Amer >60 >60 mL/min   Anion gap 10 5 - 15    Comment: Performed at [facility], [address] [place], [place]  [address]  Magnesium     Status: None   Collection Time: 10/11/19  4:39 AM  Result Value Ref Range   Magnesium 2.4 1.7 - 2.4 mg/dL    Comment: Performed at [facility], [address] [place], [place] [address]  CBC     Status: Abnormal   Collection Time: 10/11/19  6:23 AM  Result Value Ref Range   WBC 7.4 4.0 - 10.5 K/uL   RBC 3.44 (L) 4.22 - 5.81 MIL/uL   Hemoglobin 10.0 (L) 13.0 - 17.0 g/dL   HCT 31.6 (L) 39.0 - 52.0 %   MCV 91.9 80.0 - 100.0 fL   MCH 29.1 26.0 - 34.0 pg   MCHC 31.6 30.0 - 36.0 g/dL   RDW 13.8 11.5 - 15.5 %   Platelets 114 (L) 150 - 400 K/uL    Comment: Immature Platelet Fraction may be clinically indicated, consider ordering this additional test [id number] PLATELET CLUMPING, SUGGEST RECOLLECTION OF SAMPLE IN CITRATE TUBE.    nRBC 0.0 0.0 - 0.2 %    Comment: Performed at [facility], [address] [place],  [address]  Glucose, capillary     Status: None   Collection Time: 10/11/19  7:39 AM  Result Value Ref Range   Glucose-Capillary 80 70 - 99 mg/dL    Comment: Glucose reference range applies only to samples taken after fasting for at least 8 hours.   Comment 1 Notify RN    Comment 2 Document in Chart   Glucose, capillary     Status: Abnormal   Collection Time: 10/11/19 11:56 AM  Result Value Ref Range   Glucose-Capillary 208 (H) 70 - 99 mg/dL    Comment: Glucose reference range applies only to samples taken after fasting for at least 8 hours.   Comment 1 Notify RN    Comment 2 Document in Chart    PERIPHERAL VASCULAR CATHETERIZATION  Result Date: 10/10/2019 See op note   Blood pressure 120/61, pulse 75, temperature 98.7 F (37.1 C), resp. rate 16, height 5\' 9"  (1.753 m), weight 81.3 kg, SpO2 98 %.  Assessment 1. Left hallux gangrene and first MTPJ, dry - demarcating 2. Cellulitis left hallux 3. PVD 4. Diabetes type 2 with polyneuropathy  Plan -Patient seen and examined. -Left hallux ulceration appears to be  worsening with further necrosis and demarcation now extending to the first metatarsal phalangeal joint area with associated erythema that is reduced with elevation of the foot indicating rubor with dependency. -Discussed with patient that his blood flow to his foot into the left big toe was compromised which is likely led to the gangrenous changes that is seen today. -Reviewed previous CT angiogram.  Appears to have some flow through the anterior tibial artery to the dorsalis pedis as well as peroneal flow but the posterior tibial tendon appears to be occluded compromising patient's wound healing  ability. -Applied Betadine gauze to the wound followed by 4 x 4's, Kerlix, Ace wrap with no compression.  Continue daily changes. -Patient to be partial weightbearing with heel contact in a surgical shoe to the left foot. -Appreciate vascular recommendations. -Wound culture growing staph aureus and Enterococcus, sensitivities pending.  Patient has a positive MRSA nare culture.  Appreciate recommendations for antibiotics per medicine.  White blood cell count within normal limits, patient is afebrile vital signs stable. -Discussed with patient that he will need the tissues to likely demarcate further to determine level of amputation site.  Believe at this time that patient may be a viable candidate for a partial left first ray amputation.  Discussed with patient again that there is concern due to the lack of blood flow present that this may not be something that would heal.  Patient understands.  Discussed with family in detail as well.  We will need to follow-up with patient closely in clinic and when demarcation appears to be completely appearance then we will likely proceed with an amputation in the future. -Discussed with patient that any surgery is likely to fail still due to patient's blood flow as well as uncontrolled diabetes as patient's A1c was 13.7%.  Podiatry team to sign off at this time, please  reconsult if other issues arise.  Discharge instructions placed in chart.  Patient to follow-up within 1 week of discharge date.  Caroline More, DPM 10/11/2019, 1:01 PM

## 2019-10-11 NOTE — Progress Notes (Signed)
Inpatient Diabetes Program Recommendations  AACE/ADA: New Consensus Statement on Inpatient Glycemic Control (2015)  Target Ranges:  Prepandial:   less than 140 mg/dL      Peak postprandial:   less than 180 mg/dL (1-2 hours)      Critically ill patients:  140 - 180 mg/dL   Results for Jason Montes, Jason Montes (MRN [id number]) as of 10/11/2019 12:32  Ref. Range 10/10/2019 07:56 10/10/2019 12:04 10/10/2019 16:06 10/10/2019 21:02  Glucose-Capillary Latest Ref Range: 70 - 99 mg/dL 134 (H) 122 (H) 109 (H) 107 (H)   Results for Jason Montes, Jason Montes (MRN [id number]) as of 10/11/2019 12:32  Ref. Range 10/11/2019 07:39 10/11/2019 11:56  Glucose-Capillary Latest Ref Range: 70 - 99 mg/dL 80 208 (H)   Results for Jason Montes, Jason Montes (MRN [id number]) as of 10/10/2019 08:00  Ref. Range 03/11/2019 13:02 10/07/2019 19:28  Hemoglobin A1C Latest Ref Range: 4.8 - 5.6 % 6.8 (H) 13.7 (H)  (346 mg/dl)    Admit with: Left diabetic foot ulcer with mild cellulitis left hallux dry gangrene  History: DM, CVA, CKD  Home DM Meds: Jardiance 12.5 mg Daily                             Metformin 500 mg BID (only taking Daily per dtr report)                             Ozempic 1.5 mg Qweek (not taking per dtr report)  Current Orders: Novolog Sensitive Correction Scale/ SSI (0-9 units) TID AC + HS     Met with pt and his daughter this AM.  Pt resting comfortably in bed but easily arousable.  Had most of conversation with pt's daughter (son in law also present).  Daughter and son in law stated they were upset that the MD wants to d/c pt home today.  Dtr concerned that pt having increased confusion and concerned pt having/had another CVA.  Dtr and son in law told me they had asked that the nurse manager come to the room to speak with them.    Reviewed pt's current A1c of 13.7% with pt's dtr.  Dtr surprised to hear it had increased from 6.8% back in October to 13.7%.  Dtr told me pt moved out of her home about 3 months ago.  Has  a home health aide several days a week that helps pt do chores and go shopping.  Daugter told me she has been keeping an eye on pt and feels like he is taking his diabetes meds.  Daughter told me pt taking Metformin 500 daily (not BID) and Jardiance 12.'5mg'$  Daily.  Pt hasn't taken the Ozempic weekly injection in about a year.  Daughter told me she thinks pt's diet may be poor at home.  She stated pt had macaroni salad and potato salad in his fridge along with other high carbohydrate foods.  Per dtr, pt eats a lot cereal as well.  Discussed with dtr that pt's CBGs in the hospital have been relatively well controlled on minimal amounts of Novolog SSI.  With the hospital team limiting food choices, this may be why pt's CBGs look so well controlled in the hospital.  I asked the pt if he checks his CBGs at home--Pt stated yes, however, his daughter told me pt does not have a CBG meter at home.  Encouraged pt and dtr to get CBG meter  either OTC or thru the [place] so pt can check his CBGs at home.    --Will follow patient during hospitalization--  Wyn Quaker RN, MSN, CDE Diabetes Coordinator Inpatient Glycemic Control Team Team Pager: [phone] (8a-5p)

## 2019-10-11 NOTE — TOC Transition Note (Signed)
Transition of Care [facility]) - CM/SW Discharge Note   Patient Details  Name: Jason Montes MRN: [id number] Date of Birth: May 05, 1951  Transition of Care [facility]) CM/SW Contact:  Elease Hashimoto, LCSW Phone Number: 10/11/2019, 9:34 AM   Clinical Narrative:   Met with pt and daughter who has concerns regarding pt's confusion. MD met with and answered her questions. Plan to go home today and have Home health follow. Have also ordered 3 in 1 via [employer]. Pt does answer the questions asked of him. Plan to go home today and daughter to monitor him, but he will not have 24 hr care at home.    Final next level of care: [place] Barriers to Discharge: Barriers Resolved   Patient Goals and CMS Choice Patient states their goals for this hospitalization and ongoing recovery are:: to go home, get strong CMS Medicare.gov Compare Post Acute Care list provided to:: Patient Choice offered to / list presented to : Patient, Adult Children  Discharge Placement                Patient to be transferred to facility by: Daughter-via car Name of family member notified: Daughter Patient and family notified of of transfer: 10/11/19  Discharge Plan and Services     Post Acute Care Choice: Home Health          DME Arranged: 3-N-1 DME Agency: [employer] Date DME Agency Contacted: 10/11/19 Time DME Agency Contacted: [phone] Representative spoke with at DME Agency: brad HH Arranged: PT, RN [place] Agency: [place] ([place]) Date HH Agency Contacted: 10/09/19 Time [place]: [place] Representative spoke with at [place]: [place] ([place]) Interventions     Readmission Risk Interventions No flowsheet data found.

## 2019-10-11 NOTE — Progress Notes (Addendum)
Patient ID: Jason Montes, male   DOB: 10/10/1950, 69 y.o.   MRN: [id number] Spoke with dter earlier in the room. She reports pt is confused. Pt does have h/o memory loss with h/o CVA in the past  He has been answering most questions appropriately. CT head--ordered.  No neuro deficits noted. Pt is oriented to place and person  Vitals are stable He is on ASA and eliquis and statins.  D/w RN--no stroke symptoms noted by staff

## 2019-10-11 NOTE — Progress Notes (Signed)
[facility] Daily Progress Note   Subjective: 10/10/19: 1. Ultrasound guidance for vascular access right femoral artery 2. Catheter placement into left common femoral artery from right femoral approach 3. Aortogram and selective left lower extremity angiogram 4. Percutaneous transluminal angioplasty of left anterior tibial artery with 3 mm diameter angioplasty balloon 5.  Percutaneous transluminal angioplasty of left peroneal artery with 2.5 mm diameter angioplasty balloon             6.  Percutaneous transluminal angioplasty of left popliteal artery with 5 mm diameter Lutonix drug-coated angioplasty balloon 7. StarClose closure device right femoral artery  Patient without complaint this AM. No issues overnight.   Objective: Vitals:   10/10/19 1845 10/10/19 1907 10/10/19 2338 10/11/19 0739  BP: 109/61 121/68 (!) 103/43 120/61  Pulse: 74 71 83 75  Resp: 15 18 18 16   Temp:  97.7 F (36.5 C) 99.1 F (37.3 C) 98.7 F (37.1 C)  TempSrc:  Oral Oral   SpO2: 97% 99% 99% 98%  Weight:      Height:        Intake/Output Summary (Last 24 hours) at 10/11/2019 1302 Last data filed at 10/11/2019 0320 Gross per 24 hour  Intake 1543.48 ml  Output 825 ml  Net 718.48 ml   Physical Exam: A&Ox3, NAD CV: RRR Pulmonary: CTA Bilaterally Abdomen: Soft, Nontender, Nondistended Right Groin:   Access Site: Dressing clean and dry. No swelling or drainage Vascular:  Left Lower Extremity: Thigh soft, calf soft. Extremity is warm distally to toes. Podiatry dressing intact, clean and dry.     Laboratory: CBC    Component Value Date/Time   WBC 7.4 10/11/2019 0623   HGB 10.0 (L) 10/11/2019 0623   HCT 31.6 (L) 10/11/2019 0623   PLT 114 (L) 10/11/2019 0623   BMET    Component Value Date/Time   NA 138 10/11/2019 0439   K 4.4 10/11/2019 0439   CL 106 10/11/2019 0439   CO2 22 10/11/2019 0439   GLUCOSE 90 10/11/2019 0439   BUN 24 (H) 10/11/2019 0439   CREATININE 1.19 10/11/2019 0439   CALCIUM 9.0 10/11/2019 0439   GFRNONAA >60 10/11/2019 0439   GFRAA >60 10/11/2019 0439   Assessment/Planning: The patient is a 69 year old male who presented with chronic wound to the left foot status post left lower extremity angiogram with intervention 1) successful left lower extremity in vascular intervention 2) aspirin / statin for medical management 3) we will see the patient for continued follow-up in the outpatient setting  Discussed with Dr. Ellis Parents Jalissa Heinzelman PA-C 10/11/2019 1:02 PM

## 2019-10-11 NOTE — Progress Notes (Signed)
Patient is stable and ready for discharge. Patient's IV removed. Patient's belongings (clothes, cell phone, and BSC provided) was packed up by family. Writer went over discharge paperwork with patient's daughter Nira Conn and his son in law and both verbalized understanding. Also daughter asked to speak with Surveyor, quantity with concerns and questions about medical records when information was provided. Family insisted in transporting patient via [place] to private car.

## 2019-10-11 NOTE — Discharge Instructions (Signed)
Dressing: Remove dressing to left foot daily to every other day, apply betadine gauze to the L big toe gangrenous site, apply 4x4, kerlix, tape, ace wrap with minimal to no compression.  Continue close follow up with Dr. Luana Shu as an outpatient.  Your area of gangrene is going to continue to demarcate and you may require amputation in the future.  For now keep clean and as dry as possible and covered with dressings.  Take antibiotics as prescribed.

## 2019-10-11 NOTE — Discharge Summary (Signed)
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 Allergy   . Atrial fibrillation ([place])   . B12 deficiency   . Chronic kidney disease   . Chronic kidney disease (CKD), stage II (mild)   . Coronary atherosclerosis   . Diabetes mellitus without complication ([place])   . Elevated PSA   . Esophagitis   . GERD (gastroesophageal reflux disease)   . Heart attack ([place])   . Heart disease   . Hyperlipidemia   . Hypertension   . Hypokalemia   . Mild neurocognitive disorder   . Osteoarthritis   . Presbyopia   . PVD (peripheral vascular disease) ([place])   . Restless leg syndrome   . Stroke (cerebrum) ([place])   . Stroke ([place])   . Subacute osteomyelitis ([place])   . Tinea unguium   . Uncompensated short term memory deficit     HOSPITAL COURSE:  RaymondGallagheris a69 y.o.Caucasian malewith a known history of type2diabetes mellitus, hypertension, dyslipidemia, CVA and peripheral vascular disease,whopresented to the emergency room with acute onset of increased redness and puffiness in the left big toe extending to the left foot that started on Thursday and get pale white on Thursday.   1. Left diabetic foot ulcer with mild cellulitis left hallux dry gangrene  -continue IV cefepime given his penicillin allergy. -Appreciate podiatry consultation with Dr. Luana Shu-- bedside debridement done.  Recommends partial weight bearing with heel contact and surgical shoe. HHPT -Consultation with vascular surgery.-- Patient is status post revascularization with left lower extremity angiogram. PT a of left anterior tibial, left peroneal, left popliteal artery by dr dew on 5/11 -WC -- few GPC and GNR. Will change to PO doxycycline to cover staph (total 10 days) -- patient will need close follow-up with Dr. Luana Shu as outpatient. This was discussed with patient's daughter Nira Conn in the room. Follow-up appointment will be made.  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 Glucophage will be held for now. - continue Neurontin. -HbA1c is 13.7 -pt's home meds Metformin, Ozempic and Jardiance resumed at d/c. -He will need regular f/u with PCP to adjust meds along with dietary restrictions  3. Hyperkalemia-resolved -K 5.5-->3.7  4. Acute on chronic kidney injury IIIA with Diabetic nephropathy --received IV fluids -- Baseline creatinine 1.3--1.4 -came in with creatinine of 1.9-- IV fluids-- 1.6--1.19  5. Atrial fibrillation. -continue Eliquisand Coreg.  6. Hypertension. -on  Coreg and now resume lisinopril improvement in creat  7. Dyslipidemia. -continue statin therapy.  8. BPH. -continue Flomax.  9. DVT prophylaxis. -Subcutaneous Lovenox   Procedures: bedside debridement left great toe by Dr Luana Shu Family communication : spoke with daughter Donah Driver in the room Consults : podiatry, vascular CODE STATUS: full DVT Prophylaxis : eliquis  Status is: Inpatient   Dispo: The patient is from: Home  Anticipated d/c is to: Home with home health PT and RN.  Anticipated d/c date is: 5/11  Patient currently is medically stable to d/c. Discussed with Dr. Luana Shu okay to discharge from podiatry standpoint CONSULTS  OBTAINED:  Treatment Team:  Caroline More, DPM  DRUG ALLERGIES:   Allergies  Allergen Reactions  . Norvasc [Amlodipine Besylate]   . Penicillins     Childhood allergy, not sure what happens     DISCHARGE MEDICATIONS:   Allergies as of 10/11/2019      Reactions   Norvasc [amlodipine Besylate]    Penicillins    Childhood allergy, not sure what happens       Medication List    TAKE these medications   aspirin EC 81 MG tablet Take 81 mg by mouth daily.   atorvastatin 40 MG tablet Commonly known as: LIPITOR Take 40 mg by mouth at bedtime.   betamethasone valerate 0.1 % cream Commonly known as: VALISONE Apply 1 application topically 2 (two) times daily as needed.   carvedilol 3.125 MG tablet Commonly known as: COREG Take 3.125 mg by mouth 2 (two) times daily with a meal.   cetirizine 10 MG tablet Commonly known as: ZYRTEC Take 10 mg by mouth daily.   cyanocobalamin 1000 MCG/ML injection Commonly known as: (VITAMIN B-12) Inject 1,000 mcg into the muscle every 30 (thirty) days.   cyclobenzaprine 10 MG tablet Commonly known as: FLEXERIL Take 10 mg by mouth 2 (two) times daily as needed for muscle spasms.   docusate sodium 50 MG capsule Commonly known as: COLACE Take 100 mg by mouth daily.   doxycycline 100 MG tablet Commonly known as: VIBRA-TABS Take 1 tablet (100 mg total) by mouth every 12 (twelve) hours for 13 doses.   Eliquis 5 MG Tabs tablet Generic drug: apixaban Take 5 mg by mouth 2 (two) times daily.   empagliflozin 25 MG Tabs tablet Commonly known as: JARDIANCE Take 12.5 mg by mouth daily.   famotidine 20 MG tablet Commonly known as: PEPCID Take 20 mg by mouth 2 (two) times daily.   fluticasone 50 MCG/ACT nasal spray Commonly known as: FLONASE Place 2 sprays into both nostrils daily.   gabapentin 600 MG tablet Commonly known as: NEURONTIN Take 600 mg by mouth 2 (two) times daily.   glucose 4 GM chewable tablet Chew 4 tablets by mouth as  needed for low blood sugar.   lisinopril 2.5 MG tablet Commonly known as: ZESTRIL Take 2.5 mg by mouth daily.   metFORMIN 500 MG tablet Commonly known as: GLUCOPHAGE Start 1 tab by mouth in am daily for 7 days. Then increase to 1 tab in am & pm for 7 days.  Continue weekly increase to 2 tabs twice daily. What changed:   how much to take  how to take this  when to take this  additional instructions   nitroGLYCERIN 0.4 MG SL tablet Commonly known as: NITROSTAT Place 0.4 mg under the tongue every 5 (five) minutes as needed for chest pain.   Semaglutide (1 MG/DOSE) 2 MG/1.5ML Sopn Inject 1.5 mg into the skin once a week.   tamsulosin 0.4 MG Caps capsule Commonly known as: FLOMAX Take 0.4 mg by mouth daily.   torsemide 20 MG tablet Commonly known as: DEMADEX Take 20 mg by mouth daily.   Vitamin D3 25 MCG (1000 UT) Caps Take 1,000 Units by mouth daily.            Durable Medical Equipment  (From admission, onward)         Start     Ordered  10/11/19 1021  For home use only DME 3 n 1  Once     10/11/19 1020          If you experience worsening of your admission symptoms, develop shortness of breath, life threatening emergency, suicidal or homicidal thoughts you must seek medical attention immediately by calling 911 or calling your MD immediately  if symptoms less severe.  You Must read complete instructions/literature along with all the possible adverse reactions/side effects for all the Medicines you take and that have been prescribed to you. Take any new Medicines after you have completely understood and accept all the possible adverse reactions/side effects.   Please note  You were cared for by a hospitalist during your hospital stay. If you have any questions about your discharge medications or the care you received while you were in the hospital after you are discharged, you can call the unit and asked to speak with the hospitalist on call if the  hospitalist that took care of you is not available. Once you are discharged, your primary care physician will handle any further medical issues. Please note that NO REFILLS for any discharge medications will be authorized once you are discharged, as it is imperative that you return to your primary care physician (or establish a relationship with a primary care physician if you do not have one) for your aftercare needs so that they can reassess your need for medications and monitor your lab values. Today   SUBJECTIVE   Patient denies any complaints  VITAL SIGNS:  Blood pressure 120/61, pulse 75, temperature 98.7 F (37.1 C), resp. rate 16, height 5\' 9"  (1.753 m), weight 81.3 kg, SpO2 98 %.  I/O:    Intake/Output Summary (Last 24 hours) at 10/11/2019 1021 Last data filed at 10/11/2019 0320 Gross per 24 hour  Intake 1543.48 ml  Output 825 ml  Net 718.48 ml    PHYSICAL EXAMINATION:  GENERAL:  69 y.o.-year-old patient lying in the bed with no acute distress.  EYES: Pupils equal, round, reactive to light and accommodation. No scleral icterus.  HEENT: Head atraumatic, normocephalic. Oropharynx and nasopharynx clear.  NECK:  Supple, no jugular venous distention. No thyroid enlargement, no tenderness.  LUNGS: Normal breath sounds bilaterally, no wheezing, rales,rhonchi or crepitation. No use of accessory muscles of respiration.  CARDIOVASCULAR: S1, S2 normal. No murmurs, rubs, or gallops.  ABDOMEN: Soft, non-tender, non-distended. Bowel sounds present. No organomegaly or mass.  EXTREMITIES: right trans metatarsal amputation. Mild left greattoe redness  NEUROLOGIC: Cranial nerves II through XII are intact. Muscle strength 5/5 in all extremities. Bilateral lower extremity neuropathy  gait not checked.  PSYCHIATRIC: Tpatient is alert and oriented x 3.  SKIN: No obvious rash, lesion, or ulcer.   DATA REVIEW:   CBC  Recent Labs  Lab 10/11/19 0623  WBC 7.4  HGB 10.0*  HCT 31.6*  PLT 114*     Chemistries  Recent Labs  Lab 10/07/19 1734 10/08/19 0418 10/11/19 0439  NA 127*   < > 138  K 5.5*   < > 4.4  CL 92*   < > 106  CO2 20*   < > 22  GLUCOSE 334*   < > 90  BUN 35*   < > 24*  CREATININE 1.90*   < > 1.19  CALCIUM 9.2   < > 9.0  MG  --   --  2.4  AST 25  --   --   ALT 17  --   --  ALKPHOS 79  --   --   BILITOT 1.3*  --   --    < > = values in this interval not displayed.    Microbiology Results   Recent Results (from the past 240 hour(s))  Blood culture (routine x 2)     Status: None (Preliminary result)   Collection Time: 10/07/19  7:28 PM   Specimen: BLOOD  Result Value Ref Range Status   Specimen Description BLOOD RIGHT [facility]  Final   Special Requests   Final    BOTTLES DRAWN AEROBIC AND ANAEROBIC Blood Culture adequate volume   Culture   Final    NO GROWTH 4 DAYS Performed at [facility], [address] [place], [place] [address]    Report Status PENDING  Incomplete  Blood culture (routine x 2)     Status: None (Preliminary result)   Collection Time: 10/07/19  7:29 PM   Specimen: BLOOD  Result Value Ref Range Status   Specimen Description BLOOD LEFT HAND  Final   Special Requests   Final    BOTTLES DRAWN AEROBIC AND ANAEROBIC Blood Culture adequate volume   Culture   Final    NO GROWTH 4 DAYS Performed at [facility], [address] [place], [place] [address]    Report Status PENDING  Incomplete  Respiratory Panel by RT PCR (Flu A&B, Covid) - Nasopharyngeal Swab     Status: None   Collection Time: 10/07/19  7:29 PM   Specimen: Nasopharyngeal Swab  Result Value Ref Range Status   SARS Coronavirus 2 by RT PCR NEGATIVE NEGATIVE Final    Comment: (NOTE) SARS-CoV-2 target nucleic acids are NOT DETECTED. The SARS-CoV-2 RNA is generally detectable in upper respiratoy specimens during the acute phase of infection. The lowest concentration of SARS-CoV-2 viral copies this assay can detect is 131 copies/mL. A negative result  does not preclude SARS-Cov-2 infection and should not be used as the sole basis for treatment or other patient management decisions. A negative result may occur with  improper specimen collection/handling, submission of specimen other than nasopharyngeal swab, presence of viral mutation(s) within the areas targeted by this assay, and inadequate number of viral copies (<131 copies/mL). A negative result must be combined with clinical observations, patient history, and epidemiological information. The expected result is Negative. Fact Sheet for Patients:  PinkCheek.be Fact Sheet for Healthcare Providers:  GravelBags.it This test is not yet ap proved or cleared by the [place] FDA and  has been authorized for detection and/or diagnosis of SARS-CoV-2 by FDA under an Emergency Use Authorization (EUA). This EUA will remain  in effect (meaning this test can be used) for the duration of the COVID-19 declaration under Section 564(b)(1) of the Act, 21 U.S.C. section 360bbb-3(b)(1), unless the authorization is terminated or revoked sooner.    Influenza A by PCR NEGATIVE NEGATIVE Final   Influenza B by PCR NEGATIVE NEGATIVE Final    Comment: (NOTE) The Xpert Xpress SARS-CoV-2/FLU/RSV assay is intended as an aid in  the diagnosis of influenza from Nasopharyngeal swab specimens and  should not be used as a sole basis for treatment. Nasal washings and  aspirates are unacceptable for Xpert Xpress SARS-CoV-2/FLU/RSV  testing. Fact Sheet for Patients: PinkCheek.be Fact Sheet for Healthcare Providers: GravelBags.it This test is not yet approved or cleared by the [place] FDA and  has been authorized for detection and/or diagnosis of SARS-CoV-2 by  FDA under an Emergency Use Authorization (EUA). This EUA will remain  in effect (meaning this test can be used) for the duration of  the  Covid-19 declaration under Section 564(b)(1) of the Act, 21  U.S.C. section 360bbb-3(b)(1), unless the authorization is  terminated or revoked. Performed at [facility], [address] [place], [place] [address]   Aerobic/Anaerobic Culture (surgical/deep wound)     Status: Abnormal (Preliminary result)   Collection Time: 10/08/19 10:00 AM   Specimen: Wound  Result Value Ref Range Status   Specimen Description   Final    WOUND Performed at [facility], [address] [place], [place] [address]    Special Requests   Final    NONE Performed at [facility], [place], [place] [address]    Gram Stain   Final    NO WBC SEEN RARE GRAM POSITIVE COCCI RARE GRAM NEGATIVE RODS    Culture (A)  Final    STAPHYLOCOCCUS AUREUS CULTURE REINCUBATED FOR BETTER GROWTH Performed at [facility], [place] [address], [place], [place] [address]    Report Status PENDING  Incomplete  Surgical PCR screen     Status: Abnormal   Collection Time: 10/10/19  4:46 AM   Specimen: Nasal Mucosa; Nasal Swab  Result Value Ref Range Status   MRSA, PCR POSITIVE (A) NEGATIVE Final    Comment: RESULT CALLED TO, READ BACK BY AND VERIFIED WITH: KASEY VAUGHANS WARD AT [phone] ON 10/10/2019 [place].    Staphylococcus aureus POSITIVE (A) NEGATIVE Final    Comment: (NOTE) The Xpert SA Assay (FDA approved for NASAL specimens in patients 62 years of age and older), is one component of a comprehensive surveillance program. It is not intended to diagnose infection nor to guide or monitor treatment. Performed at [facility], [address] [place], [place] [address]     RADIOLOGY:  PERIPHERAL VASCULAR CATHETERIZATION  Result Date: 10/10/2019 See op note    CODE STATUS:     Code Status Orders  (From admission, onward)         Start     Ordered   10/07/19 2029  Full code  Continuous     10/07/19 2033        Code Status History    Date  Active Date Inactive Code Status Order ID Comments User Context   03/11/2019 1819 03/14/2019 1629 DNR [id number]  Demetrios Loll, MD Inpatient   [facility] Care Planning Activity       TOTAL TIME TAKING CARE OF THIS PATIENT: *40* minutes.    Fritzi Mandes M.D  [facility]    CC: Primary care physician; [facility]

## 2019-10-11 NOTE — Progress Notes (Signed)
Patient ID: Jason Montes, male   DOB: 1951/03/13, 69 y.o.   MRN: [id number]  pt's CT scan head negative for acute stroke. Tried to call pt and family in the room--was told they had left Tiffany RN will call dter and inform of the results

## 2019-10-12 LAB — CULTURE, BLOOD (ROUTINE X 2)
Culture: NO GROWTH
Culture: NO GROWTH
Special Requests: ADEQUATE
Special Requests: ADEQUATE

## 2019-10-12 NOTE — Progress Notes (Signed)
Patient ID: Jason Montes, male   DOB: [birth year], 69 y.o.   MRN: [id number] D/w HH and they weill able to do dressing changes on his toe 3 times/weekly

## 2019-10-15 LAB — AEROBIC/ANAEROBIC CULTURE W GRAM STAIN (SURGICAL/DEEP WOUND): Gram Stain: NONE SEEN

## 2019-11-07 ENCOUNTER — Telehealth (INDEPENDENT_AMBULATORY_CARE_PROVIDER_SITE_OTHER): Payer: Self-pay

## 2019-11-07 NOTE — Telephone Encounter (Signed)
Olin Hauser from [facility] left a voicemail informing that the patient stated he doesn't need nursing at this time and he can take care of the wound on the great toe.

## 2019-11-14 ENCOUNTER — Emergency Department: Payer: No Typology Code available for payment source

## 2019-11-14 ENCOUNTER — Encounter: Payer: Self-pay | Admitting: Emergency Medicine

## 2019-11-14 ENCOUNTER — Inpatient Hospital Stay
Admission: AD | Admit: 2019-11-14 | Discharge: 2019-11-24 | DRG: 481 | Disposition: A | Discharge status: Home Health | Payer: No Typology Code available for payment source | Attending: Internal Medicine | Admitting: Internal Medicine

## 2019-11-14 DIAGNOSIS — I739 Peripheral vascular disease, unspecified: Secondary | ICD-10-CM | POA: Diagnosis present

## 2019-11-14 DIAGNOSIS — S72009A Fracture of unspecified part of neck of unspecified femur, initial encounter for closed fracture: Secondary | ICD-10-CM

## 2019-11-14 DIAGNOSIS — Z7984 Long term (current) use of oral hypoglycemic drugs: Secondary | ICD-10-CM

## 2019-11-14 DIAGNOSIS — Z23 Encounter for immunization: Secondary | ICD-10-CM

## 2019-11-14 DIAGNOSIS — E11621 Type 2 diabetes mellitus with foot ulcer: Secondary | ICD-10-CM | POA: Diagnosis present

## 2019-11-14 DIAGNOSIS — S72144A Nondisplaced intertrochanteric fracture of right femur, initial encounter for closed fracture: Principal | ICD-10-CM | POA: Diagnosis present

## 2019-11-14 DIAGNOSIS — N400 Enlarged prostate without lower urinary tract symptoms: Secondary | ICD-10-CM | POA: Diagnosis present

## 2019-11-14 DIAGNOSIS — K5900 Constipation, unspecified: Secondary | ICD-10-CM | POA: Diagnosis present

## 2019-11-14 DIAGNOSIS — I493 Ventricular premature depolarization: Secondary | ICD-10-CM | POA: Diagnosis present

## 2019-11-14 DIAGNOSIS — E871 Hypo-osmolality and hyponatremia: Secondary | ICD-10-CM | POA: Diagnosis present

## 2019-11-14 DIAGNOSIS — D62 Acute posthemorrhagic anemia: Secondary | ICD-10-CM | POA: Diagnosis not present

## 2019-11-14 DIAGNOSIS — M1990 Unspecified osteoarthritis, unspecified site: Secondary | ICD-10-CM | POA: Diagnosis present

## 2019-11-14 DIAGNOSIS — E1165 Type 2 diabetes mellitus with hyperglycemia: Secondary | ICD-10-CM | POA: Diagnosis present

## 2019-11-14 DIAGNOSIS — N1831 Chronic kidney disease, stage 3a: Secondary | ICD-10-CM | POA: Diagnosis present

## 2019-11-14 DIAGNOSIS — Z833 Family history of diabetes mellitus: Secondary | ICD-10-CM

## 2019-11-14 DIAGNOSIS — I480 Paroxysmal atrial fibrillation: Secondary | ICD-10-CM | POA: Diagnosis present

## 2019-11-14 DIAGNOSIS — Z8349 Family history of other endocrine, nutritional and metabolic diseases: Secondary | ICD-10-CM

## 2019-11-14 DIAGNOSIS — E1122 Type 2 diabetes mellitus with diabetic chronic kidney disease: Secondary | ICD-10-CM | POA: Diagnosis present

## 2019-11-14 DIAGNOSIS — Z800 Family history of malignant neoplasm of digestive organs: Secondary | ICD-10-CM

## 2019-11-14 DIAGNOSIS — I5022 Chronic systolic (congestive) heart failure: Secondary | ICD-10-CM | POA: Diagnosis present

## 2019-11-14 DIAGNOSIS — Z89431 Acquired absence of right foot: Secondary | ICD-10-CM

## 2019-11-14 DIAGNOSIS — Z79899 Other long term (current) drug therapy: Secondary | ICD-10-CM

## 2019-11-14 DIAGNOSIS — W010XXA Fall on same level from slipping, tripping and stumbling without subsequent striking against object, initial encounter: Secondary | ICD-10-CM | POA: Diagnosis present

## 2019-11-14 DIAGNOSIS — D696 Thrombocytopenia, unspecified: Secondary | ICD-10-CM | POA: Diagnosis present

## 2019-11-14 DIAGNOSIS — S72001A Fracture of unspecified part of neck of right femur, initial encounter for closed fracture: Secondary | ICD-10-CM | POA: Diagnosis not present

## 2019-11-14 DIAGNOSIS — I10 Essential (primary) hypertension: Secondary | ICD-10-CM | POA: Diagnosis present

## 2019-11-14 DIAGNOSIS — L97529 Non-pressure chronic ulcer of other part of left foot with unspecified severity: Secondary | ICD-10-CM | POA: Diagnosis present

## 2019-11-14 DIAGNOSIS — I5042 Chronic combined systolic (congestive) and diastolic (congestive) heart failure: Secondary | ICD-10-CM | POA: Diagnosis present

## 2019-11-14 DIAGNOSIS — D631 Anemia in chronic kidney disease: Secondary | ICD-10-CM | POA: Diagnosis present

## 2019-11-14 DIAGNOSIS — Z888 Allergy status to other drugs, medicaments and biological substances status: Secondary | ICD-10-CM

## 2019-11-14 DIAGNOSIS — Z7982 Long term (current) use of aspirin: Secondary | ICD-10-CM

## 2019-11-14 DIAGNOSIS — E1121 Type 2 diabetes mellitus with diabetic nephropathy: Secondary | ICD-10-CM | POA: Diagnosis present

## 2019-11-14 DIAGNOSIS — E1142 Type 2 diabetes mellitus with diabetic polyneuropathy: Secondary | ICD-10-CM | POA: Diagnosis present

## 2019-11-14 DIAGNOSIS — S72141A Displaced intertrochanteric fracture of right femur, initial encounter for closed fracture: Secondary | ICD-10-CM

## 2019-11-14 DIAGNOSIS — Z8673 Personal history of transient ischemic attack (TIA), and cerebral infarction without residual deficits: Secondary | ICD-10-CM

## 2019-11-14 DIAGNOSIS — Z950 Presence of cardiac pacemaker: Secondary | ICD-10-CM

## 2019-11-14 DIAGNOSIS — Z7901 Long term (current) use of anticoagulants: Secondary | ICD-10-CM

## 2019-11-14 DIAGNOSIS — Z8249 Family history of ischemic heart disease and other diseases of the circulatory system: Secondary | ICD-10-CM

## 2019-11-14 DIAGNOSIS — I252 Old myocardial infarction: Secondary | ICD-10-CM

## 2019-11-14 DIAGNOSIS — I4891 Unspecified atrial fibrillation: Secondary | ICD-10-CM | POA: Diagnosis present

## 2019-11-14 DIAGNOSIS — Y92009 Unspecified place in unspecified non-institutional (private) residence as the place of occurrence of the external cause: Secondary | ICD-10-CM

## 2019-11-14 DIAGNOSIS — Z951 Presence of aortocoronary bypass graft: Secondary | ICD-10-CM

## 2019-11-14 DIAGNOSIS — M79675 Pain in left toe(s): Secondary | ICD-10-CM | POA: Diagnosis not present

## 2019-11-14 DIAGNOSIS — Z880 Allergy status to penicillin: Secondary | ICD-10-CM

## 2019-11-14 DIAGNOSIS — IMO0002 Reserved for concepts with insufficient information to code with codable children: Secondary | ICD-10-CM | POA: Diagnosis present

## 2019-11-14 DIAGNOSIS — E1152 Type 2 diabetes mellitus with diabetic peripheral angiopathy with gangrene: Secondary | ICD-10-CM | POA: Diagnosis present

## 2019-11-14 DIAGNOSIS — M109 Gout, unspecified: Secondary | ICD-10-CM | POA: Diagnosis present

## 2019-11-14 DIAGNOSIS — I2510 Atherosclerotic heart disease of native coronary artery without angina pectoris: Secondary | ICD-10-CM | POA: Diagnosis present

## 2019-11-14 DIAGNOSIS — Z01818 Encounter for other preprocedural examination: Secondary | ICD-10-CM

## 2019-11-14 DIAGNOSIS — E785 Hyperlipidemia, unspecified: Secondary | ICD-10-CM | POA: Diagnosis present

## 2019-11-14 DIAGNOSIS — Z419 Encounter for procedure for purposes other than remedying health state, unspecified: Secondary | ICD-10-CM

## 2019-11-14 DIAGNOSIS — Z66 Do not resuscitate: Secondary | ICD-10-CM | POA: Diagnosis present

## 2019-11-14 DIAGNOSIS — W19XXXA Unspecified fall, initial encounter: Secondary | ICD-10-CM

## 2019-11-14 DIAGNOSIS — Z20822 Contact with and (suspected) exposure to covid-19: Secondary | ICD-10-CM | POA: Diagnosis present

## 2019-11-14 NOTE — ED Triage Notes (Signed)
Pt arrived via EMS from home where he reported falling approx. 1 hour ago while sweeping floor. Pt c/o right hip pain. Pt denies LOC. Per family, pt takes eliquis BID. Pt normally uses cane and walker but was not using while sweeping tonight.

## 2019-11-15 ENCOUNTER — Other Ambulatory Visit: Payer: Self-pay

## 2019-11-15 ENCOUNTER — Emergency Department: Payer: No Typology Code available for payment source

## 2019-11-15 ENCOUNTER — Encounter: Payer: Self-pay | Admitting: Internal Medicine

## 2019-11-15 ENCOUNTER — Inpatient Hospital Stay
Admit: 2019-11-15 | Discharge: 2019-11-15 | Disposition: A | Discharge status: Home / Self Care | Payer: No Typology Code available for payment source | Attending: Internal Medicine | Admitting: Internal Medicine

## 2019-11-15 DIAGNOSIS — E1152 Type 2 diabetes mellitus with diabetic peripheral angiopathy with gangrene: Secondary | ICD-10-CM | POA: Diagnosis present

## 2019-11-15 DIAGNOSIS — I252 Old myocardial infarction: Secondary | ICD-10-CM | POA: Diagnosis not present

## 2019-11-15 DIAGNOSIS — E1122 Type 2 diabetes mellitus with diabetic chronic kidney disease: Secondary | ICD-10-CM | POA: Diagnosis present

## 2019-11-15 DIAGNOSIS — S72141A Displaced intertrochanteric fracture of right femur, initial encounter for closed fracture: Secondary | ICD-10-CM

## 2019-11-15 DIAGNOSIS — Z8673 Personal history of transient ischemic attack (TIA), and cerebral infarction without residual deficits: Secondary | ICD-10-CM | POA: Diagnosis not present

## 2019-11-15 DIAGNOSIS — E11621 Type 2 diabetes mellitus with foot ulcer: Secondary | ICD-10-CM | POA: Diagnosis present

## 2019-11-15 DIAGNOSIS — I25118 Atherosclerotic heart disease of native coronary artery with other forms of angina pectoris: Secondary | ICD-10-CM | POA: Diagnosis not present

## 2019-11-15 DIAGNOSIS — E1121 Type 2 diabetes mellitus with diabetic nephropathy: Secondary | ICD-10-CM | POA: Diagnosis present

## 2019-11-15 DIAGNOSIS — E871 Hypo-osmolality and hyponatremia: Secondary | ICD-10-CM | POA: Diagnosis present

## 2019-11-15 DIAGNOSIS — Z7901 Long term (current) use of anticoagulants: Secondary | ICD-10-CM | POA: Diagnosis not present

## 2019-11-15 DIAGNOSIS — W19XXXA Unspecified fall, initial encounter: Secondary | ICD-10-CM | POA: Diagnosis not present

## 2019-11-15 DIAGNOSIS — I480 Paroxysmal atrial fibrillation: Secondary | ICD-10-CM | POA: Diagnosis present

## 2019-11-15 DIAGNOSIS — I4891 Unspecified atrial fibrillation: Secondary | ICD-10-CM | POA: Diagnosis not present

## 2019-11-15 DIAGNOSIS — S72144A Nondisplaced intertrochanteric fracture of right femur, initial encounter for closed fracture: Secondary | ICD-10-CM | POA: Diagnosis present

## 2019-11-15 DIAGNOSIS — E1142 Type 2 diabetes mellitus with diabetic polyneuropathy: Secondary | ICD-10-CM | POA: Diagnosis present

## 2019-11-15 DIAGNOSIS — S72001A Fracture of unspecified part of neck of right femur, initial encounter for closed fracture: Secondary | ICD-10-CM | POA: Diagnosis present

## 2019-11-15 DIAGNOSIS — Z23 Encounter for immunization: Secondary | ICD-10-CM | POA: Diagnosis not present

## 2019-11-15 DIAGNOSIS — Y92009 Unspecified place in unspecified non-institutional (private) residence as the place of occurrence of the external cause: Secondary | ICD-10-CM | POA: Diagnosis not present

## 2019-11-15 DIAGNOSIS — D696 Thrombocytopenia, unspecified: Secondary | ICD-10-CM | POA: Diagnosis present

## 2019-11-15 DIAGNOSIS — L97529 Non-pressure chronic ulcer of other part of left foot with unspecified severity: Secondary | ICD-10-CM | POA: Diagnosis present

## 2019-11-15 DIAGNOSIS — E785 Hyperlipidemia, unspecified: Secondary | ICD-10-CM | POA: Diagnosis present

## 2019-11-15 DIAGNOSIS — Z880 Allergy status to penicillin: Secondary | ICD-10-CM | POA: Diagnosis not present

## 2019-11-15 DIAGNOSIS — I5042 Chronic combined systolic (congestive) and diastolic (congestive) heart failure: Secondary | ICD-10-CM | POA: Diagnosis present

## 2019-11-15 DIAGNOSIS — Z01818 Encounter for other preprocedural examination: Secondary | ICD-10-CM

## 2019-11-15 DIAGNOSIS — D62 Acute posthemorrhagic anemia: Secondary | ICD-10-CM | POA: Diagnosis not present

## 2019-11-15 DIAGNOSIS — Z20822 Contact with and (suspected) exposure to covid-19: Secondary | ICD-10-CM | POA: Diagnosis present

## 2019-11-15 DIAGNOSIS — Z66 Do not resuscitate: Secondary | ICD-10-CM | POA: Diagnosis present

## 2019-11-15 DIAGNOSIS — K5900 Constipation, unspecified: Secondary | ICD-10-CM | POA: Diagnosis present

## 2019-11-15 DIAGNOSIS — W010XXA Fall on same level from slipping, tripping and stumbling without subsequent striking against object, initial encounter: Secondary | ICD-10-CM | POA: Diagnosis present

## 2019-11-15 DIAGNOSIS — N400 Enlarged prostate without lower urinary tract symptoms: Secondary | ICD-10-CM | POA: Diagnosis present

## 2019-11-15 DIAGNOSIS — D638 Anemia in other chronic diseases classified elsewhere: Secondary | ICD-10-CM | POA: Diagnosis not present

## 2019-11-15 DIAGNOSIS — I2510 Atherosclerotic heart disease of native coronary artery without angina pectoris: Secondary | ICD-10-CM | POA: Diagnosis present

## 2019-11-15 DIAGNOSIS — I493 Ventricular premature depolarization: Secondary | ICD-10-CM | POA: Diagnosis present

## 2019-11-15 DIAGNOSIS — I4819 Other persistent atrial fibrillation: Secondary | ICD-10-CM | POA: Diagnosis not present

## 2019-11-15 DIAGNOSIS — N1831 Chronic kidney disease, stage 3a: Secondary | ICD-10-CM | POA: Diagnosis present

## 2019-11-15 LAB — GLUCOSE, CAPILLARY
Glucose-Capillary: 168 mg/dL — ABNORMAL HIGH (ref 70–99)
Glucose-Capillary: 153 mg/dL — ABNORMAL HIGH (ref 70–99)
Glucose-Capillary: 125 mg/dL — ABNORMAL HIGH (ref 70–99)
Glucose-Capillary: 109 mg/dL — ABNORMAL HIGH (ref 70–99)
Glucose-Capillary: 180 mg/dL — ABNORMAL HIGH (ref 70–99)
Glucose-Capillary: 180 mg/dL — ABNORMAL HIGH (ref 70–99)
Glucose-Capillary: 212 mg/dL — ABNORMAL HIGH (ref 70–99)
Glucose-Capillary: 170 mg/dL — ABNORMAL HIGH (ref 70–99)

## 2019-11-15 LAB — URINALYSIS, ROUTINE W REFLEX MICROSCOPIC
Bacteria, UA: NONE SEEN
Bilirubin Urine: NEGATIVE
Glucose, UA: >=500 mg/dL — AB
Hgb urine dipstick: NEGATIVE
Ketones, ur: 5 mg/dL — AB
Leukocytes,Ua: NEGATIVE
Nitrite: NEGATIVE
Protein, ur: NEGATIVE mg/dL
Specific Gravity, Urine: 1.015 (ref 1.005–1.030)
Squamous Epithelial / HPF: NONE SEEN (ref 0–5)
WBC, UA: NONE SEEN WBC/hpf (ref 0–5)
pH: 5 (ref 5.0–8.0)

## 2019-11-15 LAB — ECHOCARDIOGRAM COMPLETE: Weight: 2878.33 oz

## 2019-11-15 LAB — SURGICAL PCR SCREEN
MRSA, PCR: POSITIVE — AB
Staphylococcus aureus: POSITIVE — AB

## 2019-11-15 LAB — BASIC METABOLIC PANEL
Anion gap: 12 (ref 5–15)
BUN: 40 mg/dL — ABNORMAL HIGH (ref 8–23)
CO2: 21 mmol/L — ABNORMAL LOW (ref 22–32)
Calcium: 8.9 mg/dL (ref 8.9–10.3)
Chloride: 103 mmol/L (ref 98–111)
Creatinine, Ser: 1.57 mg/dL — ABNORMAL HIGH (ref 0.61–1.24)
GFR calc Af Amer: 52 mL/min — ABNORMAL LOW (ref >60)
GFR calc non Af Amer: 45 mL/min — ABNORMAL LOW (ref >60)
Glucose, Bld: 198 mg/dL — ABNORMAL HIGH (ref 70–99)
Potassium: 4.1 mmol/L (ref 3.5–5.1)
Sodium: 136 mmol/L (ref 135–145)

## 2019-11-15 LAB — PROTIME-INR
INR: 1.6 — ABNORMAL HIGH (ref 0.8–1.2)
Prothrombin Time: 18.8 seconds — ABNORMAL HIGH (ref 11.4–15.2)

## 2019-11-15 LAB — CBC WITH DIFFERENTIAL/PLATELET
Abs Immature Granulocytes: 0.02 10*3/uL (ref 0.00–0.07)
Basophils Absolute: 0 10*3/uL (ref 0.0–0.1)
Basophils Relative: 1 %
Eosinophils Absolute: 0.5 10*3/uL (ref 0.0–0.5)
Eosinophils Relative: 8 %
HCT: 34 % — ABNORMAL LOW (ref 39.0–52.0)
Hemoglobin: 10.5 g/dL — ABNORMAL LOW (ref 13.0–17.0)
Immature Granulocytes: 0 %
Lymphocytes Relative: 13 %
Lymphs Abs: 0.8 10*3/uL (ref 0.7–4.0)
MCH: 28.2 pg (ref 26.0–34.0)
MCHC: 30.9 g/dL (ref 30.0–36.0)
MCV: 91.4 fL (ref 80.0–100.0)
Monocytes Absolute: 0.5 10*3/uL (ref 0.1–1.0)
Monocytes Relative: 8 %
Neutro Abs: 4.4 10*3/uL (ref 1.7–7.7)
Neutrophils Relative %: 70 %
Platelets: UNDETERMINED 10*3/uL (ref 150–400)
RBC: 3.72 MIL/uL — ABNORMAL LOW (ref 4.22–5.81)
RDW: 14.5 % (ref 11.5–15.5)
WBC: 6.3 10*3/uL (ref 4.0–10.5)
nRBC: 0 % (ref 0.0–0.2)

## 2019-11-15 LAB — HEMOGLOBIN A1C
Hgb A1c MFr Bld: 11.4 % — ABNORMAL HIGH (ref 4.8–5.6)
Mean Plasma Glucose: 280.48 mg/dL

## 2019-11-15 LAB — SARS CORONAVIRUS 2 BY RT PCR (HOSPITAL ORDER, PERFORMED IN ~~LOC~~ HOSPITAL LAB): SARS Coronavirus 2: NEGATIVE

## 2019-11-15 LAB — TYPE AND SCREEN
ABO/RH(D): O POS
Antibody Screen: NEGATIVE

## 2019-11-15 LAB — ABO/RH: ABO/RH(D): O POS

## 2019-11-15 LAB — APTT: aPTT: 37 seconds — ABNORMAL HIGH (ref 24–36)

## 2019-11-15 MED ORDER — GABAPENTIN 300 MG PO CAPS
300.0000 mg | ORAL_CAPSULE | Freq: Two times a day (BID) | ORAL | Status: DC
  Administered 2019-11-15 – 2019-11-24 (×18): 300 mg via ORAL
  Filled 2019-11-15 (×18): qty 1

## 2019-11-15 MED ORDER — PNEUMOCOCCAL VAC POLYVALENT 25 MCG/0.5ML IJ INJ
0.5000 mL | INJECTION | INTRAMUSCULAR | Status: AC
  Administered 2019-11-24: 0.5 mL via INTRAMUSCULAR
  Filled 2019-11-15 (×3): qty 0.5

## 2019-11-15 MED ORDER — HYDROCODONE-ACETAMINOPHEN 5-325 MG PO TABS
1.0000 | ORAL_TABLET | ORAL | Status: DC | PRN
  Administered 2019-11-15: 1 via ORAL
  Administered 2019-11-15 – 2019-11-16 (×3): 2 via ORAL
  Filled 2019-11-15 (×4): qty 2

## 2019-11-15 MED ORDER — MORPHINE SULFATE (PF) 2 MG/ML IV SOLN
1.0000 mg | INTRAVENOUS | Status: DC | PRN
  Administered 2019-11-15 (×4): 1 mg via INTRAVENOUS
  Filled 2019-11-15 (×4): qty 1

## 2019-11-15 MED ORDER — INSULIN ASPART 100 UNIT/ML ~~LOC~~ SOLN
0.0000 [IU] | SUBCUTANEOUS | Status: DC
  Administered 2019-11-15: 5 [IU] via SUBCUTANEOUS
  Administered 2019-11-15: 2 [IU] via SUBCUTANEOUS
  Administered 2019-11-15 (×4): 3 [IU] via SUBCUTANEOUS
  Administered 2019-11-16: 2 [IU] via SUBCUTANEOUS
  Administered 2019-11-16: 3 [IU] via SUBCUTANEOUS
  Administered 2019-11-16 – 2019-11-17 (×2): 2 [IU] via SUBCUTANEOUS
  Administered 2019-11-17 (×2): 5 [IU] via SUBCUTANEOUS
  Administered 2019-11-17: 8 [IU] via SUBCUTANEOUS
  Administered 2019-11-17: 3 [IU] via SUBCUTANEOUS
  Administered 2019-11-17: 5 [IU] via SUBCUTANEOUS
  Administered 2019-11-18: 2 [IU] via SUBCUTANEOUS
  Administered 2019-11-18: 3 [IU] via SUBCUTANEOUS
  Filled 2019-11-15 (×17): qty 1

## 2019-11-15 MED ORDER — ATORVASTATIN CALCIUM 20 MG PO TABS
40.0000 mg | ORAL_TABLET | Freq: Every day | ORAL | Status: DC
  Administered 2019-11-15 – 2019-11-23 (×9): 40 mg via ORAL
  Filled 2019-11-15 (×9): qty 2

## 2019-11-15 MED ORDER — HYDROCODONE-ACETAMINOPHEN 5-325 MG PO TABS: 1.0000 | ORAL_TABLET | Freq: Four times a day (QID) | ORAL | Status: DC | PRN

## 2019-11-15 MED ORDER — SODIUM CHLORIDE 0.9 % IV SOLN: INTRAVENOUS | Status: DC

## 2019-11-15 MED ORDER — HEPARIN SODIUM (PORCINE) 5000 UNIT/ML IJ SOLN
5000.0000 [IU] | Freq: Three times a day (TID) | INTRAMUSCULAR | Status: DC
  Administered 2019-11-15 (×3): 5000 [IU] via SUBCUTANEOUS
  Filled 2019-11-15 (×3): qty 1

## 2019-11-15 MED ORDER — ENSURE MAX PROTEIN PO LIQD
11.0000 [oz_av] | Freq: Every day | ORAL | Status: DC
  Administered 2019-11-17 – 2019-11-24 (×8): 11 [oz_av] via ORAL
  Filled 2019-11-15: qty 330

## 2019-11-15 MED ORDER — VANCOMYCIN HCL IN DEXTROSE 1-5 GM/200ML-% IV SOLN
1000.0000 mg | Freq: Once | INTRAVENOUS | Status: AC
  Administered 2019-11-15: 1000 mg via INTRAVENOUS
  Filled 2019-11-15: qty 200

## 2019-11-15 MED ORDER — FAMOTIDINE 20 MG PO TABS
20.0000 mg | ORAL_TABLET | Freq: Every day | ORAL | Status: DC
  Administered 2019-11-15 – 2019-11-24 (×9): 20 mg via ORAL
  Filled 2019-11-15 (×9): qty 1

## 2019-11-15 MED ORDER — MORPHINE SULFATE (PF) 2 MG/ML IV SOLN
0.5000 mg | INTRAVENOUS | Status: DC | PRN
  Administered 2019-11-15: 0.5 mg via INTRAVENOUS
  Filled 2019-11-15: qty 1

## 2019-11-15 MED ORDER — SENNOSIDES-DOCUSATE SODIUM 8.6-50 MG PO TABS: 1.0000 | ORAL_TABLET | Freq: Every evening | ORAL | Status: DC | PRN

## 2019-11-15 MED ORDER — TORSEMIDE 20 MG PO TABS
20.0000 mg | ORAL_TABLET | Freq: Every day | ORAL | Status: DC
  Administered 2019-11-15 – 2019-11-24 (×9): 20 mg via ORAL
  Filled 2019-11-15 (×9): qty 1

## 2019-11-15 MED ORDER — NITROGLYCERIN 0.4 MG SL SUBL: 0.4000 mg | SUBLINGUAL_TABLET | SUBLINGUAL | Status: DC | PRN

## 2019-11-15 MED ORDER — FENTANYL CITRATE (PF) 100 MCG/2ML IJ SOLN
50.0000 ug | Freq: Once | INTRAMUSCULAR | Status: AC
  Administered 2019-11-15: 50 ug via INTRAVENOUS
  Filled 2019-11-15: qty 2

## 2019-11-15 MED ORDER — CHLORHEXIDINE GLUCONATE CLOTH 2 % EX PADS
6.0000 | MEDICATED_PAD | Freq: Every day | CUTANEOUS | Status: DC
  Administered 2019-11-15 – 2019-11-17 (×3): 6 via TOPICAL

## 2019-11-15 MED ORDER — CYCLOBENZAPRINE HCL 10 MG PO TABS
10.0000 mg | ORAL_TABLET | Freq: Two times a day (BID) | ORAL | Status: DC | PRN
  Administered 2019-11-16: 10 mg via ORAL
  Filled 2019-11-15: qty 1

## 2019-11-15 MED ORDER — CARVEDILOL 3.125 MG PO TABS
3.1250 mg | ORAL_TABLET | Freq: Two times a day (BID) | ORAL | Status: DC
  Administered 2019-11-15 – 2019-11-24 (×18): 3.125 mg via ORAL
  Filled 2019-11-15 (×19): qty 1

## 2019-11-15 MED ORDER — LISINOPRIL 5 MG PO TABS
2.5000 mg | ORAL_TABLET | Freq: Every day | ORAL | Status: DC
  Administered 2019-11-15 – 2019-11-24 (×8): 2.5 mg via ORAL
  Filled 2019-11-15 (×8): qty 1

## 2019-11-15 MED ORDER — MUPIROCIN 2 % EX OINT
1.0000 "application " | TOPICAL_OINTMENT | Freq: Two times a day (BID) | CUTANEOUS | Status: AC
  Administered 2019-11-15 – 2019-11-19 (×9): 1 via NASAL
  Filled 2019-11-15: qty 22

## 2019-11-15 MED ORDER — TAMSULOSIN HCL 0.4 MG PO CAPS
0.4000 mg | ORAL_CAPSULE | Freq: Every day | ORAL | Status: DC
  Administered 2019-11-15 – 2019-11-24 (×9): 0.4 mg via ORAL
  Filled 2019-11-15 (×9): qty 1

## 2019-11-15 NOTE — TOC Benefit Eligibility Note (Signed)
Transition of Care [facility]) Benefit Eligibility Note    Patient Details  Name: Jason Montes MRN: [id number] Date of Birth: [birth year]   Medication/Dose: Enoxaparin 40mg  once daily for 14 days  Covered?: Yes  Tier: Other (Tier 4)  Prescription Coverage Preferred Pharmacy: [facility] with Person/Company/Phone Number:: Lenell Antu with [employer] at [phone]  Co-Pay: $1.30 estimated copay for 4 packages (16 syringes, 4 to a box)  Prior Approval: No  Deductible:  (No deductible or out of pocket applicable. Patient does have a LIS, level 2.)    Dannette Barbara Phone Number: [phone] or [phone] 11/15/2019, 1:38 PM

## 2019-11-15 NOTE — ED Provider Notes (Signed)
[facility] Emergency Department Provider Note  ____________________________________________  Time seen: Approximately 12:30 AM  I have reviewed the triage vital signs and the nursing notes.   HISTORY  Chief Complaint Fall   HPI Jason Montes is a 69 y.o. male that history of poorly controlled diabetes, A. fib on Eliquis, chronic kidney disease, hypertension, hyperlipidemia, CVA who presents for evaluation of right hip pain.  Patient reports that he was cleaning his floor when he lost his balance and fell onto his right hip.  No head trauma or LOC.  Has been unable to ambulate or stand up since the fall.  Pain initially was severe but after receiving fentanyl per EMS pain is now 3 out of 10.  No back pain or neck pain.  Patient reports that the fall was mechanical in nature.   Past Medical History:  Diagnosis Date  . Allergy   . Atrial fibrillation ([place])   . B12 deficiency   . Chronic kidney disease   . Chronic kidney disease (CKD), stage II (mild)   . Coronary atherosclerosis   . Diabetes mellitus without complication ([place])   . Elevated PSA   . Esophagitis   . GERD (gastroesophageal reflux disease)   . Heart attack ([place])   . Heart disease   . Hyperlipidemia   . Hypertension   . Hypokalemia   . Mild neurocognitive disorder   . Osteoarthritis   . Presbyopia   . PVD (peripheral vascular disease) ([place])   . Restless leg syndrome   . Stroke (cerebrum) ([place])   . Stroke ([place])   . Subacute osteomyelitis ([place])   . Tinea unguium   . Uncompensated short term memory deficit     Patient Active Problem List   Diagnosis Date Noted  . Intertrochanteric fracture of right femur, closed, initial encounter ([place]) 11/15/2019  . Accidental fall 11/15/2019  . Preoperative clearance 11/15/2019  . Closed right hip fracture, initial encounter ([place]) 11/15/2019  . Diabetic foot infection ([place])   . Stage 3a chronic kidney disease   . Cellulitis of left foot  10/07/2019  . Malnutrition of moderate degree 03/13/2019  . ARF (acute renal failure) ([place]) 03/11/2019  . Family history of malignant neoplasm of gastrointestinal tract   . Benign neoplasm of transverse colon   . Benign neoplasm of cecum   . Benign neoplasm of ascending colon   . Benign neoplasm of descending colon   . Polyp of sigmoid colon   . Atrial fibrillation ([place]) 02/25/2018  . Vitamin B12 deficiency anemia 02/25/2018  . CKD (chronic kidney disease) stage 2, GFR 60-89 ml/min 02/25/2018  . CAD (coronary artery disease) 02/25/2018  . DM (diabetes mellitus), type 2, uncontrolled with complications ([place]) 28/36/6294  . GERD with esophagitis 02/25/2018  . Generalized muscle weakness 02/25/2018  . Chronic diastolic heart failure ([place]) 02/25/2018  . Chronic anticoagulation 02/25/2018  . 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 Hyperlipidemia 02/25/2018  . Essential hypertension 02/25/2018  . Hypokalemia 02/25/2018  . Mild neurocognitive disorder 02/25/2018  . Osteoarthritis 02/25/2018  . PVD (peripheral vascular disease) ([place]) 02/25/2018  . Presbyopia 02/25/2018  . Restless leg syndrome 02/25/2018  . History of stroke with current residual effects 02/25/2018  . Subacute osteomyelitis of right foot ([place]) 02/25/2018  . Tinea unguium 02/25/2018    Past Surgical History:  Procedure Laterality Date  . CARDIAC PACEMAKER PLACEMENT    . COLONOSCOPY WITH PROPOFOL N/A 05/18/2018   Procedure: COLONOSCOPY WITH PROPOFOL;  Surgeon: [name], MD;  Location: [facility] ENDOSCOPY;  Service: Endoscopy;  Laterality: N/A;  . CORONARY ARTERY BYPASS GRAFT    . FOOT AMPUTATION Right   . LOWER EXTREMITY ANGIOGRAPHY Left 10/10/2019   Procedure: Lower Extremity Angiography;  Surgeon: Algernon Huxley, MD;  Location: [facility];  Service: Cardiovascular;  Laterality: Left;    Prior to Admission medications   Medication Sig Start Date End Date Taking? Authorizing Provider  apixaban (ELIQUIS) 5 MG  TABS tablet Take 5 mg by mouth 2 (two) times daily.    [provider]  aspirin EC 81 MG tablet Take 81 mg by mouth daily.    [provider]  atorvastatin (LIPITOR) 40 MG tablet Take 40 mg by mouth at bedtime.     [provider]  betamethasone valerate (VALISONE) 0.1 % cream Apply 1 application topically 2 (two) times daily as needed.    [provider]  carvedilol (COREG) 3.125 MG tablet Take 3.125 mg by mouth 2 (two) times daily with a meal.    [provider]  cetirizine (ZYRTEC) 10 MG tablet Take 10 mg by mouth daily.    [provider]  Cholecalciferol (VITAMIN D3) 1000 units CAPS Take 1,000 Units by mouth daily.     [provider]  cyanocobalamin (,VITAMIN B-12,) 1000 MCG/ML injection Inject 1,000 mcg into the muscle every 30 (thirty) days.    [provider]  cyclobenzaprine (FLEXERIL) 10 MG tablet Take 10 mg by mouth 2 (two) times daily as needed for muscle spasms.    [provider]  docusate sodium (COLACE) 50 MG capsule Take 100 mg by mouth daily.     [provider]  empagliflozin (JARDIANCE) 25 MG TABS tablet Take 12.5 mg by mouth daily.    [provider]  famotidine (PEPCID) 20 MG tablet Take 20 mg by mouth 2 (two) times daily.    [provider]  fluticasone (FLONASE) 50 MCG/ACT nasal spray Place 2 sprays into both nostrils daily.     [provider]  gabapentin (NEURONTIN) 600 MG tablet Take 600 mg by mouth 2 (two) times daily.     [provider]  glucose 4 GM chewable tablet Chew 4 tablets by mouth as needed for low blood sugar.    [provider]  lisinopril (ZESTRIL) 2.5 MG tablet Take 2.5 mg by mouth daily.    [provider]  metFORMIN (GLUCOPHAGE) 500 MG tablet Start 1 tab by mouth in am daily for 7 days. Then increase to 1 tab in am & pm for 7 days.  Continue weekly increase to 2 tabs twice daily. Patient taking differently: Take  500 mg by mouth 2 (two) times daily.  02/24/18   Mikey College, NP  nitroGLYCERIN (NITROSTAT) 0.4 MG SL tablet Place 0.4 mg under the tongue every 5 (five) minutes as needed for chest pain.    [provider]  Semaglutide, 1 MG/DOSE, 2 MG/1.5ML SOPN Inject 1.5 mg into the skin once a week.    [provider]  tamsulosin (FLOMAX) 0.4 MG CAPS capsule Take 0.4 mg by mouth daily.     [provider]  torsemide (DEMADEX) 20 MG tablet Take 20 mg by mouth daily.     [provider]    Allergies Norvasc [amlodipine besylate] and Penicillins  Family History  Problem Relation Age of Onset  . Heart failure Mother   . Colon cancer Mother   . Diabetes Mother   . Thyroid disease Sister  Social History Social History   Tobacco Use  . Smoking status: Never Smoker  . Smokeless tobacco: Never Used  Vaping Use  . Vaping Use: Never used  Substance Use Topics  . Alcohol use: Never  . Drug use: Never    Review of Systems  Constitutional: Negative for fever. Eyes: Negative for visual changes. ENT: Negative for facial injury or neck injury Cardiovascular: Negative for chest injury. Respiratory: Negative for shortness of breath. Negative for chest wall injury. Gastrointestinal: Negative for abdominal pain or injury. Genitourinary: Negative for dysuria. Musculoskeletal: Negative for back injury, + R hip pain. Skin: Negative for laceration/abrasions. Neurological: Negative for head injury.   ____________________________________________   PHYSICAL EXAM:  VITAL SIGNS: ED Triage Vitals [11/14/19 2355]  Enc Vitals Group     BP (!) 141/73     Pulse Rate 84     Resp 18     Temp 98.7 F (37.1 C)     Temp Source Oral     SpO2 97 %     Weight      Height      Head Circumference      Peak Flow      Pain Score      Pain Loc      Pain Edu?      Excl. in [place]?     Full spinal precautions maintained throughout the trauma exam. Constitutional:  Alert and oriented. No acute distress. Does not appear intoxicated. HEENT Head: Normocephalic and atraumatic. Face: No facial bony tenderness. Stable midface Ears: No hemotympanum bilaterally. No Battle sign Eyes: No eye injury. PERRL. No raccoon eyes Nose: Nontender. No epistaxis. No rhinorrhea Mouth/Throat: Mucous membranes are moist. No oropharyngeal blood. No dental injury. Airway patent without stridor. Normal voice. Neck: no C-collar. No midline c-spine tenderness.  Cardiovascular: Normal rate, regular rhythm. Normal and symmetric distal pulses are present in all extremities. Pulmonary/Chest: Chest wall is stable and nontender to palpation/compression. Normal respiratory effort. Breath sounds are normal. No crepitus.  Abdominal: Soft, nontender, non distended. Musculoskeletal: Right lower extremity is shortened and externally rotated.  Patient has amputation of the right toes.  Patient has a necrotic wound on the left toe. nontender with normal full range of motion in all other extremities. No deformities. No thoracic or lumbar midline spinal tenderness. Pelvis is stable. Skin: Skin is warm, dry and intact. No abrasions or contutions. Psychiatric: Speech and behavior are appropriate. Neurological: Normal speech and language. Moves all extremities to command. No gross focal neurologic deficits are appreciated.  Glascow Coma Score: 4 - Opens eyes on own 6 - Follows simple motor commands 5 - Alert and oriented GCS: 15   ____________________________________________   LABS (all labs ordered are listed, but only abnormal results are displayed)  Labs Reviewed  PROTIME-INR - Abnormal; Notable for the following components:      Result Value   Prothrombin Time 18.8 (*)    INR 1.6 (*)    All other components within normal limits  APTT - Abnormal; Notable for the following components:   aPTT 37 (*)    All other components within normal limits  BASIC METABOLIC PANEL - Abnormal; Notable  for the following components:   CO2 21 (*)    Glucose, Bld 198 (*)    BUN 40 (*)    Creatinine, Ser 1.57 (*)    GFR calc non Af Amer 45 (*)    GFR calc Af Amer 52 (*)    All other components within normal  limits  SARS CORONAVIRUS 2 BY RT PCR (HOSPITAL ORDER, [facility])  CBC WITH DIFFERENTIAL/PLATELET  HEMOGLOBIN A1C  CBC  URINALYSIS, ROUTINE W REFLEX MICROSCOPIC  CBC WITH DIFFERENTIAL/PLATELET  TYPE AND SCREEN   ____________________________________________  EKG  ED ECG REPORT I, Rudene Re, the attending physician, personally viewed and interpreted this ECG.  Normal sinus rhythm, rate of 87, occasional PVCs, normal axis, no ST elevations or depressions, T wave inversions in inferior lateral leads.  Unchanged from prior. ____________________________________________  RADIOLOGY  I have personally reviewed the images performed during this visit and I agree with the Radiologist's read.   Interpretation by Radiologist:  DG Chest Portable 1 View  Result Date: 11/15/2019 CLINICAL DATA:  Preop EXAM: PORTABLE CHEST 1 VIEW COMPARISON:  March 11, 2019 FINDINGS: The heart size and mediastinal contours are within normal limits. Aortic knob calcifications are seen. A left-sided pacemaker seen with the lead tip in the right ventricle. Overlying median sternotomy wires are present. Both lungs are clear. The visualized skeletal structures are unremarkable. IMPRESSION: No active disease. Electronically Signed   By: Prudencio Pair M.D.   On: 11/15/2019 00:56   DG Hip Unilat W or Wo Pelvis 2-3 Views Right  Result Date: 11/15/2019 CLINICAL DATA:  Fall EXAM: DG HIP (WITH OR WITHOUT PELVIS) 2-3V RIGHT COMPARISON:  None. FINDINGS: There is a nondisplaced fracture involving the right intratrochanteric region extending through the lesser trochanter. There is also cortical step-off seen at the lateral corner of the femoral head neck junction which could represent a  nondisplaced fracture. There is diffuse osteopenia. Scattered dense vascular calcifications are noted. IMPRESSION: Nondisplaced right intratrochanteric fracture with overlying soft tissue swelling. Electronically Signed   By: Prudencio Pair M.D.   On: 11/15/2019 00:21     ____________________________________________   PROCEDURES  Procedure(s) performed:yes .1-3 Lead EKG Interpretation Performed by: Rudene Re, MD Authorized by: Rudene Re, MD     Interpretation: normal     ECG rate assessment: normal     Rhythm: sinus rhythm     Ectopy: PVCs     Critical Care performed:  None ____________________________________________   INITIAL IMPRESSION / ASSESSMENT AND PLAN / ED COURSE  69 y.o. male that history of poorly controlled diabetes, A. fib on Eliquis, chronic kidney disease, hypertension, hyperlipidemia, CVA who presents for evaluation of right hip pain status post mechanical fall.  Patient with a shortened externally rotated right lower extremity.  X-rays show an intertrochanteric femur fracture which is confirmed by radiology.  Discussed with Dr. Mack Guise from orthopedics recommend admitting to the hospitalist service for medical clearance.  Discussed with Dr. Damita Dunnings who accepted patient's care.  Preop chest x-ray and EKG with no acute findings.  Labs showing hyperglycemia with a glucose of 198 but no evidence of DKA, kidney function is at baseline.  Plan discussed with patient and his brother who was at bedside.  Old medical records reviewed.       ____________________________________________  Please note:  Patient was evaluated in Emergency Department today for the symptoms described in the history of present illness. Patient was evaluated in the context of the global COVID-19 pandemic, which necessitated consideration that the patient might be at risk for infection with the SARS-CoV-2 virus that causes COVID-19. Institutional protocols and algorithms that pertain to  the evaluation of patients at risk for COVID-19 are in a state of rapid change based on information released by regulatory bodies including the CDC and federal and state organizations. These policies  and algorithms were followed during the patient's care in the ED.  Some ED evaluations and interventions may be delayed as a result of limited staffing during the pandemic.   ____________________________________________   FINAL CLINICAL IMPRESSION(S) / ED DIAGNOSES   Final diagnoses:  Fall, initial encounter  Closed fracture of right hip, initial encounter ([place])      NEW MEDICATIONS STARTED DURING THIS VISIT:  ED Discharge Orders    None       Note:  This document was prepared using Dragon voice recognition software and may include unintentional dictation errors.    Rudene Re, MD 11/15/19 [phone]

## 2019-11-15 NOTE — Progress Notes (Signed)
PROGRESS NOTE    Jason Montes  NID:[id number] DOB: 1950/08/30 DOA: 11/14/2019 PCP: [facility]   Chief Complaint  Patient presents with  . 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 fib on Eliquis, hypertension and BPH presented after sustaining a mechanical fall and landing on his right hip.  X-ray done in the ED showed nondisplaced right intertrochanteric fracture. Seen by orthopedics with plan on operative repair once cleared from cardiac evaluation.    Assessment & Plan:   Principal Problem:   Intertrochanteric fracture of right femur, closed, initial encounter ([place]) Secondary to mechanical fall.  Eliquis held.  Pain control with as needed Percocet every 4 hours and morphine 1 mg IV every 2 hours. Orthopedic surgery on board.  Cardiology consulted for clearance given severe PAD and recent revascularization.  2D echo ordered which shows EF of 40-45% (new finding) and LVH.  No wall motion abnormality.  Patient euvolemic.  Cardiology recommends to continue Coreg, lisinopril, torsemide and Lipitor.  Further recommendations per cardiology.  Active Problems: Diabetic foot ulcer, full-thickness Seen by podiatry.  Recommends that it appears dry and stable without sign of infection and follow as outpatient.  Systolic CHF, acute versus chronic 2D echo with EF of 40-45% without wall motion abnormality.  Patient is euvolemic.  Continue beta-blocker, torsemide, lisinopril and statin.  Cardiology following.  Peripheral arterial disease Recently underwent revascularization of left leg on 5/10 for nonhealing ulcer.  Also has right transmetatarsal amputation.  Antiplatelets on hold.  Continue statin.  Paroxysmal A. fib ([place]) Rate controlled.  Continue Coreg.  Eliquis on hold for  surgery.    CAD (coronary artery disease) Asymptomatic.  Continue beta-blocker and statin.  Follow 2D echo     DM (diabetes mellitus), type 2, uncontrolled with nephropathy and neuropathy (HCC) A1c of 13.8.  CBGs in 300s.  Added Lantus 20 units at bedtime along with Premeal aspart 4 units 3 times daily.    Chronic diastolic heart failure (HCC) Currently euvolemic.  Continue torsemide, Coreg and lisinopril.  Follow echo.  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  Renal function stable.  Essential hypertension Stable.  Continue home meds.     DVT prophylaxis: Subcu Lovenox Code Status: Full code Family Communication: None at bedside Disposition:   Status is: Inpatient  Remains inpatient appropriate because:Ongoing diagnostic testing needed not appropriate for outpatient work up.  Right hip fracture needs inpatient surgery   Dispo: The patient is from: Home              Anticipated d/c is to: SNF              Anticipated d/c date is: 3 days              Patient currently is not medically stable to d/c.       Consultants:   Orthopedics, cardiology, podiatry  Procedures: 2D echo   Antimicrobials: None   Subjective: Seen and examined.  Complains of pain over the right hip.  Objective: Vitals:   11/15/19 0100 11/15/19 0210 11/15/19 0748 11/15/19 1154  BP:  (!) 143/99 115/60 (!) 114/54  Pulse:  92 89 87  Resp:  17 18 17   Temp:  98.2 F (36.8 C) 98.2 F (36.8 C) 98.4 F (36.9 C)  TempSrc:  Oral Oral Oral  SpO2:  96% 95%  98%  Weight: 81.6 kg       Intake/Output Summary (Last 24 hours) at 11/15/2019 1233 Last data filed at 11/15/2019 1002 Gross per 24 hour  Intake 424.09 ml  Output 650 ml  Net -225.91 ml   Filed Weights   11/15/19 0100  Weight: 81.6 kg    Examination:  General: Elderly male in some distress with pain HEENT: Moist with a, supple neck Chest: Clear CVs: Normal S1-S2, no murmurs, rubs or gallop GI: Soft,  nondistended, nontender Musculoskeletal: Warm, impaired mobility of right hip due to fracture, surgical right transmetatarsal amputation, ulceration of the left great toe with clean dressing.    Data Reviewed: I have personally reviewed following labs and imaging studies  CBC: Recent Labs  Lab 11/15/19 0036  WBC 6.3  NEUTROABS 4.4  HGB 10.5*  HCT 34.0*  MCV 91.4  PLT PLATELET CLUMPS NOTED ON SMEAR, UNABLE TO ESTIMATE    Basic Metabolic Panel: Recent Labs  Lab 11/15/19 0036  NA 136  K 4.1  CL 103  CO2 21*  GLUCOSE 198*  BUN 40*  CREATININE 1.57*  CALCIUM 8.9    GFR: Estimated Creatinine Clearance: 46.3 mL/min (A) (by C-G formula based on SCr of 1.57 mg/dL (H)).  Liver Function Tests: No results for input(s): AST, ALT, ALKPHOS, BILITOT, PROT, ALBUMIN in the last 168 hours.  CBG: Recent Labs  Lab 11/15/19 0216 11/15/19 0403 11/15/19 0624 11/15/19 0745 11/15/19 1155  GLUCAP 168* 153* 125* 109* 180*     Recent Results (from the past 240 hour(s))  SARS Coronavirus 2 by RT PCR (hospital order, performed in [facility] hospital lab) Nasopharyngeal Nasopharyngeal Swab     Status: None   Collection Time: 11/15/19 12:36 AM   Specimen: Nasopharyngeal Swab  Result Value Ref Range Status   SARS Coronavirus 2 NEGATIVE NEGATIVE Final    Comment: (NOTE) SARS-CoV-2 target nucleic acids are NOT DETECTED.  The SARS-CoV-2 RNA is generally detectable in upper and lower respiratory specimens during the acute phase of infection. The lowest concentration of SARS-CoV-2 viral copies this assay can detect is 250 copies / mL. A negative result does not preclude SARS-CoV-2 infection and should not be used as the sole basis for treatment or other patient management decisions.  A negative result may occur with improper specimen collection / handling, submission of specimen other than nasopharyngeal swab, presence of viral mutation(s) within the areas targeted by this assay, and  inadequate number of viral copies (<250 copies / mL). A negative result must be combined with clinical observations, patient history, and epidemiological information.  Fact Sheet for Patients:   StrictlyIdeas.no  Fact Sheet for Healthcare Providers: BankingDealers.co.za  This test is not yet approved or  cleared by the [place] FDA and has been authorized for detection and/or diagnosis of SARS-CoV-2 by FDA under an Emergency Use Authorization (EUA).  This EUA will remain in effect (meaning this test can be used) for the duration of the COVID-19 declaration under Section 564(b)(1) of the Act, 21 U.S.C. section 360bbb-3(b)(1), unless the authorization is terminated or revoked sooner.  Performed at [facility], [address], [place], [place] [address]   Surgical pcr screen     Status: Abnormal   Collection Time: 11/15/19  4:05 AM   Specimen: Nasal Mucosa; Nasal Swab  Result Value Ref Range Status   MRSA, PCR POSITIVE (A) NEGATIVE Final    Comment: RESULT CALLED TO, READ BACK BY AND VERIFIED WITH: [place] [phone] 11/15/19 HNM  Staphylococcus aureus POSITIVE (A) NEGATIVE Final    Comment: (NOTE) The Xpert SA Assay (FDA approved for NASAL specimens in patients 22 years of age and older), is one component of a comprehensive surveillance program. It is not intended to diagnose infection nor to guide or monitor treatment. Performed at [facility], [address], [place], [place] [address]          Radiology Studies: DG Chest Portable 1 View  Result Date: 11/15/2019 CLINICAL DATA:  Preop EXAM: PORTABLE CHEST 1 VIEW COMPARISON:  March 11, 2019 FINDINGS: The heart size and mediastinal contours are within normal limits. Aortic knob calcifications are seen. A left-sided pacemaker seen with the lead tip in the right ventricle. Overlying median sternotomy wires are present. Both lungs are clear. The  visualized skeletal structures are unremarkable. IMPRESSION: No active disease. Electronically Signed   By: Prudencio Pair M.D.   On: 11/15/2019 00:56   ECHOCARDIOGRAM COMPLETE  Result Date: 11/15/2019    ECHOCARDIOGRAM REPORT   Patient Name:   GARI HARTSELL Date of Exam: 11/15/2019 Medical Rec #:  [id number]           Height:       69.0 in Accession #:    [id number]          Weight:       179.9 lb Date of Birth:  10-30-1950           BSA:          1.975 m Patient Age:    25 years            BP:           115/60 mmHg Patient Gender: M                   HR:           89 bpm. Exam Location:  [facility] Procedure: 2D Echo, Color Doppler and Cardiac Doppler Indications:     CHF- acute diastolic 109.32  History:         Patient has no prior history of Echocardiogram examinations.                  Stroke; Risk Factors:Hypertension. Heart attack, PVD.  Sonographer:     Sherrie Sport RDCS (AE) Referring Phys:  [id number] Athena Masse Diagnosing Phys: Bartholome Bill MD  Sonographer Comments: Technically difficult study due to poor echo windows, no apical window, no subcostal window and suboptimal parasternal window. IMPRESSIONS  1. Left ventricular ejection fraction, by estimation, is 40 to 45%. The left ventricle has mildly decreased function. The left ventricle has no regional wall motion abnormalities. The left ventricular internal cavity size was mildly dilated. Left ventricular diastolic parameters were normal.  2. Right ventricular systolic function is normal. The right ventricular size is normal. There is moderately elevated pulmonary artery systolic pressure.  3. The mitral valve was not well visualized. Trivial mitral valve regurgitation.  4. The aortic valve was not well visualized. Aortic valve regurgitation is trivial. FINDINGS  Left Ventricle: Left ventricular ejection fraction, by estimation, is 40 to 45%. The left ventricle has mildly decreased function. The left ventricle has no regional wall motion abnormalities.  The left ventricular internal cavity size was mildly dilated. There is borderline left ventricular hypertrophy. Left ventricular diastolic parameters were normal. Right Ventricle: The right ventricular size is normal. No increase in right ventricular wall thickness. Right ventricular systolic function is normal. There is moderately elevated pulmonary  artery systolic pressure. The tricuspid regurgitant velocity is 3.03 m/s, and with an assumed right atrial pressure of 10 mmHg, the estimated right ventricular systolic pressure is 34.1 mmHg. Left Atrium: Left atrial size was normal in size. Right Atrium: Right atrial size was normal in size. Pericardium: There is no evidence of pericardial effusion. Mitral Valve: The mitral valve was not well visualized. Trivial mitral valve regurgitation. Tricuspid Valve: The tricuspid valve is not well visualized. Tricuspid valve regurgitation is trivial. Aortic Valve: The aortic valve was not well visualized. Aortic valve regurgitation is trivial. There is no evidence of aortic valve vegetation. Pulmonic Valve: The pulmonic valve was not well visualized. Pulmonic valve regurgitation is not visualized. Aorta: The aortic root was not well visualized. IAS/Shunts: The interatrial septum was not assessed. Additional Comments: A pacer wire is visualized.  LEFT VENTRICLE PLAX 2D LVIDd:         5.42 cm LVIDs:         4.36 cm LV PW:         1.06 cm LV IVS:        0.89 cm LVOT diam:     2.30 cm LVOT Area:     4.15 cm  LEFT ATRIUM         Index LA diam:    4.90 cm 2.48 cm/m   AORTA Ao Root diam: 3.20 cm TRICUSPID VALVE TR Peak grad:   36.7 mmHg TR Vmax:        303.00 cm/s  SHUNTS Systemic Diam: 2.30 cm Bartholome Bill MD Electronically signed by Bartholome Bill MD Signature Date/Time: 11/15/2019/12:16:04 PM    Final    DG Hip Unilat W or Wo Pelvis 2-3 Views Right  Result Date: 11/15/2019 CLINICAL DATA:  Fall EXAM: DG HIP (WITH OR WITHOUT PELVIS) 2-3V RIGHT COMPARISON:  None. FINDINGS: There is  a nondisplaced fracture involving the right intratrochanteric region extending through the lesser trochanter. There is also cortical step-off seen at the lateral corner of the femoral head neck junction which could represent a nondisplaced fracture. There is diffuse osteopenia. Scattered dense vascular calcifications are noted. IMPRESSION: Nondisplaced right intratrochanteric fracture with overlying soft tissue swelling. Electronically Signed   By: Prudencio Pair M.D.   On: 11/15/2019 00:21        Scheduled Meds: . atorvastatin  40 mg Oral QHS  . carvedilol  3.125 mg Oral BID WC  . Chlorhexidine Gluconate Cloth  6 each Topical Q0600  . famotidine  20 mg Oral Daily  . gabapentin  300 mg Oral BID  . heparin  5,000 Units Subcutaneous Q8H  . insulin aspart  0-15 Units Subcutaneous Q4H  . lisinopril  2.5 mg Oral Daily  . mupirocin ointment  1 application Nasal BID  . [START ON 11/16/2019] pneumococcal 23 valent vaccine  0.5 mL Intramuscular Tomorrow-1000  . tamsulosin  0.4 mg Oral Daily  . torsemide  20 mg Oral Daily   Continuous Infusions: . sodium chloride 100 mL/hr at 11/15/19 0246     LOS: 0 days    Time spent: 25 minutes   Brenda Samano, MD [facility]   To contact the attending provider between 7A-7P or the covering provider during after hours 7P-7A, please log into the web site [email or web address] and access using universal [place] password for that web site. If you do not have the password, please call the hospital operator.  11/15/2019, 12:33 PM

## 2019-11-15 NOTE — Progress Notes (Signed)
*  PRELIMINARY RESULTS* Echocardiogram 2D Echocardiogram has been performed.  Sherrie Sport 11/15/2019, 8:40 AM

## 2019-11-15 NOTE — Consult Note (Addendum)
Cardiology Consultation Note    Patient ID: CLEMON Montes, MRN: [id number], DOB/AGE: [birth year] 68 y.o. Admit date: 11/14/2019   Date of Consult: 11/15/2019 Primary Physician: [facility] Primary Cardiologist:    Chief Complaint: right hip pain. Reason for Consultation: preop Requesting MD: Dr. Damita Dunnings  HPI: Jason Montes is a 69 y.o. 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  EKG revealed sinus rhythm with nonspecific ST-T wave changes in the inferolateral leads.  PVCs seen.  Chest x-ray revealed no active cardiopulmonary disease.  Patient had acute on chronic renal insufficiency with a creatinine of 1.57 up from 1.19 on 10/11/2019.  Hemoglobin and hematocrit was 10.5 and 34.  White count was normal.  Patient states that he had his cardiac issues when he was "in a coma".  He does not remember much of the admission.  He was in prison at the time and states he was treated in [facility].  Records are not currently available.  He states his ejection fraction was in the 30s initially but had improved to the upper 40s to lower 50s per his recollection.  He has not had an evaluation recently outside the [facility].  His health care is now being taken care of at the [facility] per his report.  He denies any chest pain.  He is able to ambulate with a walker without any chest pain or shortness of breath.  .  He is not aware of any arrhythmias. - Past Medical History:  Diagnosis Date  . Allergy   . Atrial fibrillation ([place])   . B12 deficiency   . Chronic kidney disease   . Chronic kidney disease (CKD), stage  II (mild)   . Coronary atherosclerosis   . Diabetes mellitus without complication ([place])   . Elevated PSA   . Esophagitis   . GERD (gastroesophageal reflux disease)   . Heart attack ([place])   . Heart disease   . Hyperlipidemia   . Hypertension   . Hypokalemia   . Mild neurocognitive disorder   . Osteoarthritis   . Presbyopia   . PVD (peripheral vascular disease) ([place])   . Restless leg syndrome   . Stroke (cerebrum) ([place])   . Stroke ([place])   . Subacute osteomyelitis ([place])   . Tinea unguium   . Uncompensated short term memory deficit       Surgical History:  Past Surgical History:  Procedure Laterality Date  . CARDIAC PACEMAKER PLACEMENT    . COLONOSCOPY WITH PROPOFOL N/A 05/18/2018   Procedure: COLONOSCOPY WITH PROPOFOL;  Surgeon: [name], MD;  Location: [facility] ENDOSCOPY;  Service: Endoscopy;  Laterality: N/A;  . CORONARY ARTERY BYPASS GRAFT    . FOOT AMPUTATION Right   . LOWER EXTREMITY ANGIOGRAPHY Left 10/10/2019   Procedure: Lower Extremity Angiography;  Surgeon: Algernon Huxley, MD;  Location: [facility] CV LAB;  Service: Cardiovascular;  Laterality: Left;     Home Meds: Prior to Admission medications   Medication Sig Start Date End Date Taking? Authorizing Provider  apixaban (ELIQUIS) 5 MG TABS tablet Take 5 mg by mouth 2 (two) times daily.    [provider]  aspirin EC 81 MG tablet  Take 81 mg by mouth daily.    [provider]  atorvastatin (LIPITOR) 40 MG tablet Take 40 mg by mouth at bedtime.     [provider]  betamethasone valerate (VALISONE) 0.1 % cream Apply 1 application topically 2 (two) times daily as needed.    [provider]  carvedilol (COREG) 3.125 MG tablet Take 3.125 mg by mouth 2 (two) times daily with a meal.    [provider]  cetirizine (ZYRTEC) 10 MG tablet Take 10 mg by mouth daily.    [provider]  Cholecalciferol (VITAMIN D3) 1000 units CAPS Take 1,000 Units by mouth daily.      [provider]  cyanocobalamin (,VITAMIN B-12,) 1000 MCG/ML injection Inject 1,000 mcg into the muscle every 30 (thirty) days.    [provider]  cyclobenzaprine (FLEXERIL) 10 MG tablet Take 10 mg by mouth 2 (two) times daily as needed for muscle spasms.    [provider]  docusate sodium (COLACE) 50 MG capsule Take 100 mg by mouth daily.     [provider]  empagliflozin (JARDIANCE) 25 MG TABS tablet Take 12.5 mg by mouth daily.    [provider]  famotidine (PEPCID) 20 MG tablet Take 20 mg by mouth 2 (two) times daily.    [provider]  fluticasone (FLONASE) 50 MCG/ACT nasal spray Place 2 sprays into both nostrils daily.     [provider]  gabapentin (NEURONTIN) 600 MG tablet Take 600 mg by mouth 2 (two) times daily.     [provider]  glucose 4 GM chewable tablet Chew 4 tablets by mouth as needed for low blood sugar.    [provider]  lisinopril (ZESTRIL) 2.5 MG tablet Take 2.5 mg by mouth daily.    [provider]  metFORMIN (GLUCOPHAGE) 500 MG tablet Start 1 tab by mouth in am daily for 7 days. Then increase to 1 tab in am & pm for 7 days.  Continue weekly increase to 2 tabs twice daily. Patient taking differently: Take 500 mg by mouth 2 (two) times daily.  02/24/18   Mikey College, NP  nitroGLYCERIN (NITROSTAT) 0.4 MG SL tablet Place 0.4 mg under the tongue every 5 (five) minutes as needed for chest pain.    [provider]  Semaglutide, 1 MG/DOSE, 2 MG/1.5ML SOPN Inject 1.5 mg into the skin once a week.    [provider]  tamsulosin (FLOMAX) 0.4 MG CAPS capsule Take 0.4 mg by mouth daily.     [provider]  torsemide (DEMADEX) 20 MG tablet Take 20 mg by mouth daily.     [provider]    Inpatient Medications:  . atorvastatin  40 mg Oral QHS  . carvedilol  3.125 mg Oral BID WC  . Chlorhexidine Gluconate Cloth  6 each Topical Q0600  .  famotidine  20 mg Oral Daily  . gabapentin  300 mg Oral BID  . heparin  5,000 Units Subcutaneous Q8H  . insulin aspart  0-15 Units Subcutaneous Q4H  . lisinopril  2.5 mg Oral Daily  . mupirocin ointment  1 application Nasal BID  . [START ON 11/16/2019] pneumococcal 23 valent vaccine  0.5 mL Intramuscular Tomorrow-1000  . tamsulosin  0.4 mg Oral Daily  . torsemide  20 mg Oral Daily   . sodium chloride 100 mL/hr at 11/15/19 0246    Allergies:  Allergies  Allergen Reactions  . Norvasc [Amlodipine Besylate]   . Penicillins  Childhood allergy, not sure what happens     Social History   Socioeconomic History  . Marital status: Single    Spouse name: Not on file  . Number of children: Not on file  . Years of education: Not on file  . Highest education level: Not on file  Occupational History  . Not on file  Tobacco Use  . Smoking status: Never Smoker  . Smokeless tobacco: Never Used  Vaping Use  . Vaping Use: Never used  Substance and Sexual Activity  . Alcohol use: Never  . Drug use: Never  . Sexual activity: Not on file  Other Topics Concern  . Not on file  Social History Narrative  . Not on file   Social Determinants of Health   Financial Resource Strain:   . Difficulty of Paying Living Expenses:   Food Insecurity:   . Worried About Charity fundraiser in the Last Year:   . Arboriculturist in the Last Year:   Transportation Needs:   . Film/video editor (Medical):   Marland Kitchen Lack of Transportation (Non-Medical):   Physical Activity:   . Days of Exercise per Week:   . Minutes of Exercise per Session:   Stress:   . Feeling of Stress :   Social Connections:   . Frequency of Communication with Friends and Family:   . Frequency of Social Gatherings with Friends and Family:   . Attends Religious Services:   . Active Member of Clubs or Organizations:   . Attends Archivist Meetings:   Marland Kitchen Marital Status:   Intimate Partner Violence:   . Fear of Current  or Ex-Partner:   . Emotionally Abused:   Marland Kitchen Physically Abused:   . Sexually Abused:      Family History  Problem Relation Age of Onset  . Heart failure Mother   . Colon cancer Mother   . Diabetes Mother   . Thyroid disease Sister      Review of Systems: A 12-system review of systems was performed and is negative except as noted in the HPI.  Labs: No results for input(s): CKTOTAL, CKMB, TROPONINI in the last 72 hours. Lab Results  Component Value Date   WBC 6.3 11/15/2019   HGB 10.5 (L) 11/15/2019   HCT 34.0 (L) 11/15/2019   MCV 91.4 11/15/2019   PLT PLATELET CLUMPS NOTED ON SMEAR, UNABLE TO ESTIMATE 11/15/2019    Recent Labs  Lab 11/15/19 0036  NA 136  K 4.1  CL 103  CO2 21*  BUN 40*  CREATININE 1.57*  CALCIUM 8.9  GLUCOSE 198*   No results found for: CHOL, HDL, LDLCALC, TRIG No results found for: DDIMER  Radiology/Studies:  DG Chest Portable 1 View  Result Date: 11/15/2019 CLINICAL DATA:  Preop EXAM: PORTABLE CHEST 1 VIEW COMPARISON:  March 11, 2019 FINDINGS: The heart size and mediastinal contours are within normal limits. Aortic knob calcifications are seen. A left-sided pacemaker seen with the lead tip in the right ventricle. Overlying median sternotomy wires are present. Both lungs are clear. The visualized skeletal structures are unremarkable. IMPRESSION: No active disease. Electronically Signed   By: Prudencio Pair M.D.   On: 11/15/2019 00:56   DG Hip Unilat W or Wo Pelvis 2-3 Views Right  Result Date: 11/15/2019 CLINICAL DATA:  Fall EXAM: DG HIP (WITH OR WITHOUT PELVIS) 2-3V RIGHT COMPARISON:  None. FINDINGS: There is a nondisplaced fracture involving the right intratrochanteric region extending through the lesser trochanter. There  is also cortical step-off seen at the lateral corner of the femoral head neck junction which could represent a nondisplaced fracture. There is diffuse osteopenia. Scattered dense vascular calcifications are noted. IMPRESSION:  Nondisplaced right intratrochanteric fracture with overlying soft tissue swelling. Electronically Signed   By: Prudencio Pair M.D.   On: 11/15/2019 00:21    Wt Readings from Last 3 Encounters:  11/15/19 81.6 kg  10/07/19 81.3 kg  04/08/19 70.3 kg    EKG: Normal sinus rhythm with nonspecific ST-T wave changes.  Physical Exam:  Blood pressure (!) 143/99, pulse 92, temperature 98.2 F (36.8 C), temperature source Oral, resp. rate 17, weight 81.6 kg, SpO2 96 %. Body mass index is 26.57 kg/m. General: Well developed, well nourished, in no acute distress. Head: Normocephalic, atraumatic, sclera non-icteric, no xanthomas, nares are without discharge.  Neck: Negative for carotid bruits. JVD not elevated. Lungs: Clear bilaterally to auscultation without wheezes, rales, or rhonchi. Breathing is unlabored. Heart: RRR with S1 S2. No murmurs, rubs, or gallops appreciated. Abdomen: Soft, non-tender, non-distended with normoactive bowel sounds. No hepatomegaly. No rebound/guarding. No obvious abdominal masses. Msk:  Strength and tone appear normal for age. Extremities: No clubbing or cyanosis. No edema.  Distal pedal pulses are 2+ and equal bilaterally. Neuro: Alert and oriented X 3. No facial asymmetry. No focal deficit. Moves all extremities spontaneously. Psych:  Responds to questions appropriately with a normal affect.     Assessment and Plan  69 year old male with an apparent history of atrial fibrillation and myocardial infarction though timing and details of this are unclear as he was in the prison system at the time.  He states his ejection fraction was in the 30s but has improved to a higher value recently.  He denies any shortness of breath syncope or chest pain.  He has gangrene on his left great toe which limits his ambulation.  He ambulates typically with a walker and is able to do this without any chest pain or excessive shortness of breath.  Electrocardiogram was unremarkable.  He now is  admitted after right hip fracture.  He is being evaluated with regard to restratification prior to surgery.  He has been on Eliquis and his last dose was yesterday a.  This has been held.    -Would continue with carvedilol at 3.125 mg twice daily, lisinopril 2.5 mg daily, torsemide 20 mg daily and atorvastatin 40 mg daily.  These are his outpatient medications.  We will continue to hold Eliquis as you are doing.    Signed, Teodoro Spray MD 11/15/2019, 7:10 AM Pager: [phone]   Addendum:  Echo shows EF 40 to 45% with no significant valvular abnormalities.  Similar to report is previous evaluation.  Patient appears to be optimized from a cardiac standpoint for surgery.  We will proceed with routine cardiopulmonary monitoring remaining on current meds with the exception of Eliquis.  Would resume Eliquis postop.

## 2019-11-15 NOTE — H&P (Signed)
History and Physical    Jason Montes OQH:[id number] DOB: Feb 16, 1951 DOA: 11/14/2019  PCP: [facility]   Patient coming from: home  I have personally briefly reviewed patient's old medical records in [place]  Chief Complaint: Fall  HPI: Jason Montes is a 69 y.o. 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  Patient was at baseline ambulates with a cane and walker had a misstep while sweeping causing the fall.  Denies preceding one-sided weakness headache, chest pain shortness of breath or lightheadedness.  Denies preceding palpitations.  He did not strike his head and had no loss of consciousness but was unable to get up.  ED Course: Vitals on arrival unremarkable.  Blood work still pending.  Hip x-ray showed nondisplaced right intertrochanteric fracture with overlying soft tissue swelling.  Review of Systems: As per HPI otherwise 10 point review of systems negative.    Past Medical History:  Diagnosis Date  . Allergy   . Atrial fibrillation ([place])   . B12 deficiency   . Chronic kidney disease   . Chronic kidney disease (CKD), stage II (mild)   . Coronary atherosclerosis   . Diabetes mellitus without complication ([place])   . Elevated PSA   . Esophagitis   . GERD (gastroesophageal reflux disease)   . Heart attack ([place])   . Heart disease   . Hyperlipidemia   . Hypertension   . Hypokalemia   . Mild neurocognitive disorder   . Osteoarthritis   . Presbyopia   . PVD (peripheral vascular disease) ([place])   . Restless leg syndrome   . Stroke (cerebrum) ([place])   . Stroke ([place])   . Subacute osteomyelitis ([place])   .  Tinea unguium   . Uncompensated short term memory deficit     Past Surgical History:  Procedure Laterality Date  . CARDIAC PACEMAKER PLACEMENT    . COLONOSCOPY WITH PROPOFOL N/A 05/18/2018   Procedure: COLONOSCOPY WITH PROPOFOL;  Surgeon: [name], MD;  Location: [facility] ENDOSCOPY;  Service: Endoscopy;  Laterality: N/A;  . CORONARY ARTERY BYPASS GRAFT    . FOOT AMPUTATION Right   . LOWER EXTREMITY ANGIOGRAPHY Left 10/10/2019   Procedure: Lower Extremity Angiography;  Surgeon: Algernon Huxley, MD;  Location: [place] CV LAB;  Service: Cardiovascular;  Laterality: Left;     reports that he has never smoked. He has never used smokeless tobacco. He reports that he does not drink alcohol and does not use drugs.  Allergies  Allergen Reactions  . Norvasc [Amlodipine Besylate]   . Penicillins     Childhood allergy, not sure what happens     Family History  Problem Relation Age of Onset  . Heart failure Mother   . Colon cancer Mother   . Diabetes Mother   . Thyroid disease Sister      Prior to Admission medications   Medication Sig Start Date End Date Taking? Authorizing Provider  apixaban (ELIQUIS) 5 MG TABS tablet Take 5 mg by mouth 2 (two) times daily.    [provider]  aspirin EC 81 MG tablet Take 81 mg by mouth daily.    [provider]  atorvastatin (LIPITOR) 40 MG tablet Take 40 mg by  mouth at bedtime.     [provider]  betamethasone valerate (VALISONE) 0.1 % cream Apply 1 application topically 2 (two) times daily as needed.    [provider]  carvedilol (COREG) 3.125 MG tablet Take 3.125 mg by mouth 2 (two) times daily with a meal.    [provider]  cetirizine (ZYRTEC) 10 MG tablet Take 10 mg by mouth daily.    [provider]  Cholecalciferol (VITAMIN D3) 1000 units CAPS Take 1,000 Units by mouth daily.     [provider]  cyanocobalamin (,VITAMIN B-12,) 1000 MCG/ML injection Inject 1,000 mcg into the  muscle every 30 (thirty) days.    [provider]  cyclobenzaprine (FLEXERIL) 10 MG tablet Take 10 mg by mouth 2 (two) times daily as needed for muscle spasms.    [provider]  docusate sodium (COLACE) 50 MG capsule Take 100 mg by mouth daily.     [provider]  empagliflozin (JARDIANCE) 25 MG TABS tablet Take 12.5 mg by mouth daily.    [provider]  famotidine (PEPCID) 20 MG tablet Take 20 mg by mouth 2 (two) times daily.    [provider]  fluticasone (FLONASE) 50 MCG/ACT nasal spray Place 2 sprays into both nostrils daily.     [provider]  gabapentin (NEURONTIN) 600 MG tablet Take 600 mg by mouth 2 (two) times daily.     [provider]  glucose 4 GM chewable tablet Chew 4 tablets by mouth as needed for low blood sugar.    [provider]  lisinopril (ZESTRIL) 2.5 MG tablet Take 2.5 mg by mouth daily.    [provider]  metFORMIN (GLUCOPHAGE) 500 MG tablet Start 1 tab by mouth in am daily for 7 days. Then increase to 1 tab in am & pm for 7 days.  Continue weekly increase to 2 tabs twice daily. Patient taking differently: Take 500 mg by mouth 2 (two) times daily.  02/24/18   Mikey College, NP  nitroGLYCERIN (NITROSTAT) 0.4 MG SL tablet Place 0.4 mg under the tongue every 5 (five) minutes as needed for chest pain.    [provider]  Semaglutide, 1 MG/DOSE, 2 MG/1.5ML SOPN Inject 1.5 mg into the skin once a week.    [provider]  tamsulosin (FLOMAX) 0.4 MG CAPS capsule Take 0.4 mg by mouth daily.     [provider]  torsemide (DEMADEX) 20 MG tablet Take 20 mg by mouth daily.     [provider]    Physical Exam: Vitals:   11/14/19 2355  BP: (!) 141/73  Pulse: 84  Resp: 18  Temp: 98.7 F (37.1 C)  TempSrc: Oral  SpO2: 97%     Vitals:   11/14/19 2355  BP: (!) 141/73  Pulse: 84  Resp: 18  Temp: 98.7 F (37.1 C)  TempSrc: Oral  SpO2: 97%       Constitutional: Alert and oriented x 3 . Not in any apparent distress HEENT:      Head: Normocephalic and atraumatic.         Eyes: PERLA, EOMI, Conjunctivae are normal. Sclera is non-icteric.       Mouth/Throat: Mucous membranes are moist.       Neck: Supple with no signs of meningismus. Cardiovascular: Regular rate and rhythm. No murmurs, gallops, or rubs. 2+ symmetrical distal pulses are present . No JVD. No LE edema Respiratory: Respiratory effort normal .Lungs sounds clear bilaterally. No wheezes,  crackles, or rhonchi.  Gastrointestinal: Soft, non tender, and non distended with positive bowel sounds. No rebound or guarding. Genitourinary: No CVA tenderness. Musculoskeletal:  Shortened right lower extremity in external rotation.  Right transmetatarsal amputation .ulcer plantar aspect of left foot neurologic: Normal speech and language. Face is symmetric. Moving all extremities. No gross focal neurologic deficits . Skin: Skin is warm, dry.  Ulcer plantar aspect left great toe and below left MTP joint with eschar Psychiatric: Mood and affect are normal Speech and behavior are normal   Labs on Admission: I have personally reviewed following labs and imaging studies  CBC: No results for input(s): WBC, NEUTROABS, HGB, HCT, MCV, PLT in the last 168 hours. Basic Metabolic Panel: Recent Labs  Lab 11/15/19 0036  NA 136  K 4.1  CL 103  CO2 21*  GLUCOSE 198*  BUN 40*  CREATININE 1.57*  CALCIUM 8.9   GFR: Estimated Creatinine Clearance: 46.3 mL/min (A) (by C-G formula based on SCr of 1.57 mg/dL (H)). Liver Function Tests: No results for input(s): AST, ALT, ALKPHOS, BILITOT, PROT, ALBUMIN in the last 168 hours. No results for input(s): LIPASE, AMYLASE in the last 168 hours. No results for input(s): AMMONIA in the last 168 hours. Coagulation Profile: Recent Labs  Lab 11/15/19 0036  INR 1.6*   Cardiac Enzymes: No results for input(s): CKTOTAL, CKMB, CKMBINDEX, TROPONINI in  the last 168 hours. BNP (last 3 results) No results for input(s): PROBNP in the last 8760 hours. HbA1C: No results for input(s): HGBA1C in the last 72 hours. CBG: No results for input(s): GLUCAP in the last 168 hours. Lipid Profile: No results for input(s): CHOL, HDL, LDLCALC, TRIG, CHOLHDL, LDLDIRECT in the last 72 hours. Thyroid Function Tests: No results for input(s): TSH, T4TOTAL, FREET4, T3FREE, THYROIDAB in the last 72 hours. Anemia Panel: No results for input(s): VITAMINB12, FOLATE, FERRITIN, TIBC, IRON, RETICCTPCT in the last 72 hours. Urine analysis:    Component Value Date/Time   COLORURINE STRAW (A) 10/07/2019 2009   APPEARANCEUR CLEAR (A) 10/07/2019 2009   LABSPEC 1.015 10/07/2019 2009   PHURINE 7.0 10/07/2019 2009   GLUCOSEU >=500 (A) 10/07/2019 2009   HGBUR NEGATIVE 10/07/2019 2009   BILIRUBINUR NEGATIVE 10/07/2019 2009   KETONESUR 5 (A) 10/07/2019 2009   PROTEINUR NEGATIVE 10/07/2019 2009   NITRITE NEGATIVE 10/07/2019 2009   LEUKOCYTESUR NEGATIVE 10/07/2019 2009    Radiological Exams on Admission: DG Hip Unilat W or Wo Pelvis 2-3 Views Right  Result Date: 11/15/2019 CLINICAL DATA:  Fall EXAM: DG HIP (WITH OR WITHOUT PELVIS) 2-3V RIGHT COMPARISON:  None. FINDINGS: There is a nondisplaced fracture involving the right intratrochanteric region extending through the lesser trochanter. There is also cortical step-off seen at the lateral corner of the femoral head neck junction which could represent a nondisplaced fracture. There is diffuse osteopenia. Scattered dense vascular calcifications are noted. IMPRESSION: Nondisplaced right intratrochanteric fracture with overlying soft tissue swelling. Electronically Signed   By: Prudencio Pair M.D.   On: 11/15/2019 00:21    EKG: Independently reviewed.   Assessment/Plan Principal Problem:   Intertrochanteric fracture of right femur, closed, initial encounter ([place])   Accidental fall   Preoperative clearance -Patient with  severe peripheral vascular disease status post recent revascularization, A. fib on Eliquis and other chronic comorbidities presents with what appears to be an accidental fall while sweeping.  Hip x-ray showing nondisplaced right intertrochanteric fracture -ER provider spoke with Dr. Mack Guise who would like patient optimized for surgery in the next 24 to  36 hours -Pain control -Consult orthopedist -Patient is on Eliquis so will be held -We will request cardiac consult to assist with preoperative clearance given that patient has severe peripheral vascular disease, with recent revascularization and complex medical history -Echocardiogram in the a.m. -We will keep n.p.o. in case stress test desired to assist with clearance    Atrial fibrillation ([place]) on chronic anticoagulation -Hold Eliquis in preparation for surgery -Continue rate control agents    CAD (coronary artery disease) -No complaints of chest pain -EKG and baseline troponin -Cardiology consult for possible stress test in a.m.  If needed for cardiac clearance -No prior cardiac work-up seen on Care Everywhere and chart review   DM (diabetes mellitus), type 2, uncontrolled with complications (HCC) -Sliding scale coverage -Last A1c was 13.8 a month ago -Follow A1c    Chronic diastolic heart failure (HCC) -Appears euvolemic -Continue carvedilol and torsemide.  Continue lisinopril -Echocardiogram to evaluate left ventricular systolic function in preparation for surgery  CKD 2 -Follow-up blood work    Essential hypertension -Continue lisinopril    PVD (peripheral vascular disease) ([place]) -Patient recently underwent revascularization left leg on 10/10/2019 by Dr. Lucky Cowboy due to nonhealing ulcer, still present -History of right transmetatarsal amputation -Holding antiplatelets in preparation for surgery -Continue  Diabetic foot ulcer left foot with fat layer exposed -Podiatry consult -Wound care    DVT prophylaxis: SCDs Code  Status: full code  Family Communication:  none  Disposition Plan: Back to previous home environment Consults called: Ortho, podiatry, cardiology Status:inp    Athena Masse MD [facility]     11/15/2019, 12:40 AM

## 2019-11-15 NOTE — Progress Notes (Signed)
Initial Nutrition Assessment  DOCUMENTATION CODES:   Not applicable  INTERVENTION:  Provide Ensure Max Protein po once daily, each supplement provides 150 kcal and 30 grams of protein.  NUTRITION DIAGNOSIS:   Increased nutrient needs related to wound healing, hip fracture, post-op healing as evidenced by estimated needs.  GOAL:   Patient will meet greater than or equal to 90% of their needs  MONITOR:   PO intake, Supplement acceptance, Labs, Weight trends, Skin, I & O's  REASON FOR ASSESSMENT:   Consult Assessment of nutrition requirement/status  ASSESSMENT:   69 year old male with PMHx of DM with nephropathy and polyneuropathy, HTN, HLD, CKD, hx CVA, A-fib, CKD, GERD, hx right foot transmetatarsal amputation, CAD s/p CABG, nonhealing left diabetic foot ulcer, recent revascularization of left tibial, peroneal, and popliteal arteries on 5/10 who is now admitted after mechanical fall with intertrochanteric fracture of right femur.   Met with patient at bedside. He is known to this RD from a previous assessment on 03/13/2019. Patient reports his appetite is much better now and has returned to normal. He reports he eats 3-4 meals per day and eats well at those meals. He reports following a carbohydrate-modified diet in setting of DM and denies any educational needs at this time. Patient tries to eat high-protein diet. He reports drinking protein shakes at home. Patient is amenable to drinking Ensure Max to help meet protein needs. Patient previously met criteria for moderate malnutrition. He has gained weight and no longer meets criteria for malnutrition. Patient is currently eating 90-100% of his meals here. Patient uses cane or walker to ambulate at baseline.  Patient has been gaining weight. His UBW was 175 lbs. Patient lost down to 70.3 kg (154.66 lbs) on 04/08/2019. He is now up to 81.6 kg (179.9 lbs).  Medications reviewed and include: famotidine, Novolog 0-15 units Q4hrs,  lisinopril, torsemide.  Labs reviewed: CBG 109-180, CO2 21, BUN 40, Creatinine 1.57.  Patient does not meet criteria for malnutrition at this time.  NUTRITION - FOCUSED PHYSICAL EXAM:    Most Recent Value  Orbital Region No depletion  Upper Arm Region Mild depletion  Thoracic and Lumbar Region No depletion  Buccal Region No depletion  Temple Region No depletion  Clavicle Bone Region No depletion  Clavicle and Acromion Bone Region No depletion  Scapular Bone Region No depletion  Dorsal Hand Moderate depletion  Patellar Region Moderate depletion  Anterior Thigh Region Moderate depletion  Posterior Calf Region Moderate depletion  Edema (RD Assessment) None  Hair Reviewed  Eyes Reviewed  Mouth Reviewed  Skin Reviewed  Nails Reviewed     Diet Order:   Diet Order            Diet heart healthy/carb modified Room service appropriate? Yes; Fluid consistency: Thin  Diet effective now                EDUCATION NEEDS:   No education needs have been identified at this time  Skin:  Skin Assessment: Skin Integrity Issues: (left great toe ischemic wound  3 cm x 2.2 cm)  Last BM:  11/14/2019  Height:   Ht Readings from Last 1 Encounters:  10/07/19 '5\' 9"'$  (1.753 m)   Weight:   Wt Readings from Last 1 Encounters:  11/15/19 81.6 kg   BMI:  Body mass index is 26.57 kg/m.  Estimated Nutritional Needs:   Kcal:  2100-2300  Protein:  105-115 grams  Fluid:  2 L/day  Jacklynn Barnacle, MS, RD, LDN  Pager number available on Amion

## 2019-11-15 NOTE — Consult Note (Signed)
[facility] Nurse Consult Note: Reason for Consult: LEft great toe ischemic wound. Podiatry has consulted and recommended continue daily betadine swab and dry dressing.  No further needs.  Right foot with transmetatarsal amputation.  0.2 cm scabbed lesion at 5th metatarsal site.  Wound type:vascular Pressure Injury POA: NA Measurement: 3 cm x 2.2 cm necrotic left great toe.  Wound BAQ:VOHCSP Drainage (amount, consistency, odor) none Periwound:intact Dressing procedure/placement/frequency:Swab left great toe wound with betadine daily. Cover with dry dressing.  Will not follow at this time.  Please re-consult if needed.  Domenic Moras MSN, RN, FNP-BC [facility] Wound, Ostomy, Continence Nurse Pager [phone]

## 2019-11-15 NOTE — Consult Note (Signed)
ORTHOPAEDIC CONSULTATION  REQUESTING PHYSICIAN: Louellen Molder, MD  Chief Complaint: Right hip pain status post fall  HPI: Jason Montes is a 69 y.o. male is admitted to the hospital service after presenting with a right hip fracture after fall at home yesterday.  Patient states he was sweeping the floor and fell.  He does not call the exact mechanism of his fall.  Patient states he has had a previous stroke has affected his right side as well as his cognition.  Patient has peripheral vascular disease and underwent a catherization/reperfusion procedure by Dr. Lucky Cowboy on 10/10/19.  He has necrosis of the right hallux which is being followed by Dr. Luana Shu in podiatry.  Patient has uncontrolled diabetes and his hemoglobin A1c on arrival is 11.4.  Patient is admitted to the hospitalist service for preop clearance.  Orthopedics is consulted for management of the right intertrochanteric hip fracture.  Past Medical History:  Diagnosis Date  . Allergy   . Atrial fibrillation ([place])   . B12 deficiency   . Chronic kidney disease   . Chronic kidney disease (CKD), stage II (mild)   . Coronary atherosclerosis   . Diabetes mellitus without complication ([place])   . Elevated PSA   . Esophagitis   . GERD (gastroesophageal reflux disease)   . Heart attack ([place])   . Heart disease   . Hyperlipidemia   . Hypertension   . Hypokalemia   . Mild neurocognitive disorder   . Osteoarthritis   . Presbyopia   . PVD (peripheral vascular disease) ([place])   . Restless leg syndrome   . Stroke (cerebrum) ([place])   . Stroke ([place])   . Subacute osteomyelitis ([place])   . Tinea unguium   . Uncompensated short term memory deficit    Past Surgical History:  Procedure Laterality Date  . CARDIAC PACEMAKER PLACEMENT    . COLONOSCOPY WITH PROPOFOL N/A 05/18/2018   Procedure: COLONOSCOPY WITH PROPOFOL;  Surgeon: [name], MD;  Location: [facility] ENDOSCOPY;  Service: Endoscopy;  Laterality: N/A;  . CORONARY ARTERY BYPASS GRAFT     . FOOT AMPUTATION Right   . LOWER EXTREMITY ANGIOGRAPHY Left 10/10/2019   Procedure: Lower Extremity Angiography;  Surgeon: Algernon Huxley, MD;  Location: [facility] CV LAB;  Service: Cardiovascular;  Laterality: Left;   Social History   Socioeconomic History  . Marital status: Single    Spouse name: Not on file  . Number of children: Not on file  . Years of education: Not on file  . Highest education level: Not on file  Occupational History  . Not on file  Tobacco Use  . Smoking status: Never Smoker  . Smokeless tobacco: Never Used  Vaping Use  . Vaping Use: Never used  Substance and Sexual Activity  . Alcohol use: Never  . Drug use: Never  . Sexual activity: Not on file  Other Topics Concern  . Not on file  Social History Narrative  . Not on file   Social Determinants of Health   Financial Resource Strain:   . Difficulty of Paying Living Expenses:   Food Insecurity:   . Worried About Charity fundraiser in the Last Year:   . Arboriculturist in the Last Year:   Transportation Needs:   . Film/video editor (Medical):   Marland Kitchen Lack of Transportation (Non-Medical):   Physical Activity:   . Days of Exercise per Week:   . Minutes of Exercise per Session:   Stress:   .  Feeling of Stress :   Social Connections:   . Frequency of Communication with Friends and Family:   . Frequency of Social Gatherings with Friends and Family:   . Attends Religious Services:   . Active Member of Clubs or Organizations:   . Attends Archivist Meetings:   Marland Kitchen Marital Status:    Family History  Problem Relation Age of Onset  . Heart failure Mother   . Colon cancer Mother   . Diabetes Mother   . Thyroid disease Sister    Allergies  Allergen Reactions  . Norvasc [Amlodipine Besylate]   . Penicillins     Childhood allergy, not sure what happens    Prior to Admission medications   Medication Sig Start Date End Date Taking? Authorizing Provider  apixaban (ELIQUIS) 5 MG  TABS tablet Take 5 mg by mouth 2 (two) times daily.    [provider]  aspirin EC 81 MG tablet Take 81 mg by mouth daily.    [provider]  atorvastatin (LIPITOR) 40 MG tablet Take 40 mg by mouth at bedtime.     [provider]  betamethasone valerate (VALISONE) 0.1 % cream Apply 1 application topically 2 (two) times daily as needed.    [provider]  carvedilol (COREG) 3.125 MG tablet Take 3.125 mg by mouth 2 (two) times daily with a meal.    [provider]  cetirizine (ZYRTEC) 10 MG tablet Take 10 mg by mouth daily.    [provider]  Cholecalciferol (VITAMIN D3) 1000 units CAPS Take 1,000 Units by mouth daily.     [provider]  cyanocobalamin (,VITAMIN B-12,) 1000 MCG/ML injection Inject 1,000 mcg into the muscle every 30 (thirty) days.    [provider]  cyclobenzaprine (FLEXERIL) 10 MG tablet Take 10 mg by mouth 2 (two) times daily as needed for muscle spasms.    [provider]  docusate sodium (COLACE) 50 MG capsule Take 100 mg by mouth daily.     [provider]  empagliflozin (JARDIANCE) 25 MG TABS tablet Take 12.5 mg by mouth daily.    [provider]  famotidine (PEPCID) 20 MG tablet Take 20 mg by mouth 2 (two) times daily.    [provider]  fluticasone (FLONASE) 50 MCG/ACT nasal spray Place 2 sprays into both nostrils daily.     [provider]  gabapentin (NEURONTIN) 600 MG tablet Take 600 mg by mouth 2 (two) times daily.     [provider]  glucose 4 GM chewable tablet Chew 4 tablets by mouth as needed for low blood sugar.    [provider]  lisinopril (ZESTRIL) 2.5 MG tablet Take 2.5 mg by mouth daily.    [provider]  metFORMIN (GLUCOPHAGE) 500 MG tablet Start 1 tab by mouth in am daily for 7 days. Then increase to 1 tab in am & pm for 7 days.  Continue weekly increase to 2 tabs twice daily. Patient taking differently: Take  500 mg by mouth 2 (two) times daily.  02/24/18   Mikey College, NP  nitroGLYCERIN (NITROSTAT) 0.4 MG SL tablet Place 0.4 mg under the tongue every 5 (five) minutes as needed for chest pain.    [provider]  Semaglutide, 1 MG/DOSE, 2 MG/1.5ML SOPN Inject 1.5 mg into the skin once a week.    [provider]  tamsulosin (FLOMAX) 0.4 MG CAPS capsule Take 0.4 mg by mouth daily.     [provider]  torsemide (DEMADEX) 20 MG tablet Take 20 mg by mouth daily.     [provider]   DG Chest Portable 1 View  Result Date: 11/15/2019 CLINICAL DATA:  Preop EXAM: PORTABLE CHEST 1 VIEW COMPARISON:  March 11, 2019 FINDINGS: The heart size and mediastinal contours are within normal limits. Aortic knob calcifications are seen. A left-sided pacemaker seen with the lead tip in the right ventricle. Overlying median sternotomy wires are present. Both lungs are clear. The visualized skeletal structures are unremarkable. IMPRESSION: No active disease. Electronically Signed   By: Prudencio Pair M.D.   On: 11/15/2019 00:56   ECHOCARDIOGRAM COMPLETE  Result Date: 11/15/2019    ECHOCARDIOGRAM REPORT   Patient Name:   BALTAZAR PEKALA Date of Exam: 11/15/2019 Medical Rec #:  [id number]           Height:       69.0 in Accession #:    [id number]          Weight:       179.9 lb Date of Birth:  02/26/1951           BSA:          1.975 m Patient Age:    31 years            BP:           115/60 mmHg Patient Gender: M                   HR:           89 bpm. Exam Location:  [facility] Procedure: 2D Echo, Color Doppler and Cardiac Doppler Indications:     CHF- acute diastolic 680.32  History:         Patient has no prior history of Echocardiogram examinations.                  Stroke; Risk Factors:Hypertension. Heart attack, PVD.  Sonographer:     Sherrie Sport RDCS (AE) Referring Phys:  [id number] Athena Masse Diagnosing Phys: Bartholome Bill MD  Sonographer Comments: Technically difficult study due to poor  echo windows, no apical window, no subcostal window and suboptimal parasternal window. IMPRESSIONS  1. Left ventricular ejection fraction, by estimation, is 40 to 45%. The left ventricle has mildly decreased function. The left ventricle has no regional wall motion abnormalities. The left ventricular internal cavity size was mildly dilated. Left ventricular diastolic parameters were normal.  2. Right ventricular systolic function is normal. The right ventricular size is normal. There is moderately elevated pulmonary artery systolic pressure.  3. The mitral valve was not well visualized. Trivial mitral valve regurgitation.  4. The aortic valve was not well visualized. Aortic valve regurgitation is trivial. FINDINGS  Left Ventricle: Left ventricular ejection fraction, by estimation, is 40 to 45%. The left ventricle has mildly decreased function. The left ventricle has no regional wall motion abnormalities. The left ventricular internal cavity size was mildly dilated. There is borderline left ventricular hypertrophy. Left ventricular diastolic parameters were normal. Right Ventricle: The right ventricular size is normal. No increase in right ventricular wall thickness. Right ventricular systolic function is normal. There is moderately elevated pulmonary artery systolic pressure. The tricuspid regurgitant velocity is 3.03 m/s, and with an assumed right atrial pressure of 10 mmHg, the estimated right ventricular systolic pressure is 00.3 mmHg. Left Atrium: Left atrial size was normal in size. Right Atrium: Right atrial size was normal in size. Pericardium: There is  no evidence of pericardial effusion. Mitral Valve: The mitral valve was not well visualized. Trivial mitral valve regurgitation. Tricuspid Valve: The tricuspid valve is not well visualized. Tricuspid valve regurgitation is trivial. Aortic Valve: The aortic valve was not well visualized. Aortic valve regurgitation is trivial. There is no evidence of aortic valve  vegetation. Pulmonic Valve: The pulmonic valve was not well visualized. Pulmonic valve regurgitation is not visualized. Aorta: The aortic root was not well visualized. IAS/Shunts: The interatrial septum was not assessed. Additional Comments: A pacer wire is visualized.  LEFT VENTRICLE PLAX 2D LVIDd:         5.42 cm LVIDs:         4.36 cm LV PW:         1.06 cm LV IVS:        0.89 cm LVOT diam:     2.30 cm LVOT Area:     4.15 cm  LEFT ATRIUM         Index LA diam:    4.90 cm 2.48 cm/m   AORTA Ao Root diam: 3.20 cm TRICUSPID VALVE TR Peak grad:   36.7 mmHg TR Vmax:        303.00 cm/s  SHUNTS Systemic Diam: 2.30 cm Bartholome Bill MD Electronically signed by Bartholome Bill MD Signature Date/Time: 11/15/2019/12:16:04 PM    Final    DG Hip Unilat W or Wo Pelvis 2-3 Views Right  Result Date: 11/15/2019 CLINICAL DATA:  Fall EXAM: DG HIP (WITH OR WITHOUT PELVIS) 2-3V RIGHT COMPARISON:  None. FINDINGS: There is a nondisplaced fracture involving the right intratrochanteric region extending through the lesser trochanter. There is also cortical step-off seen at the lateral corner of the femoral head neck junction which could represent a nondisplaced fracture. There is diffuse osteopenia. Scattered dense vascular calcifications are noted. IMPRESSION: Nondisplaced right intratrochanteric fracture with overlying soft tissue swelling. Electronically Signed   By: Prudencio Pair M.D.   On: 11/15/2019 00:21    Positive ROS: All other systems have been reviewed and were otherwise negative with the exception of those mentioned in the HPI and as above.  Physical Exam: General: Alert, no acute distress  MUSCULOSKELETAL: Right extremity: Patient has intact skin.  There is no erythema ecchymosis or significant swelling.  His thigh compartments are soft compressible.  Patient has had previous metatarsal amputations of his toes in the right foot.  He can dorsiflex and plantarflex his ankle.  He has no sensation in the lower leg to light  touch of the knee.  This is due to baseline neuropathy/deficit from his prior stroke.  No significant shortening or external rotation of his right lower extremity.  Assessment: Nondisplaced right intertrochanteric hip fracture fracture  Plan: I have explained to the patient the nature of his fracture.  I have recommended intramedullary fixation for this fracture.  Explained to the patient the details of the operation as well as the postoperative course.  I discussed the risks and benefits of surgery. The risks include but are not limited to infection, bleeding requiring blood transfusion, nerve or blood vessel injury, joint stiffness or loss of motion, persistent pain, weakness or instability, malunion, nonunion and hardware failure and the need for further surgery. Medical risks include but are not limited to DVT and pulmonary embolism, myocardial infarction, stroke, pneumonia, respiratory failure and death. Patient understood these risks and wished to proceed.   I reviewed the patient's labs and x-rays in preparation for the surgery.   Patient has been cleared for surgery  by Dr. Ubaldo Glassing from cardiology.  Eliquis is on hold.       Thornton Park, MD    11/15/2019 4:12 PM

## 2019-11-15 NOTE — Plan of Care (Signed)
  Problem: Health Behavior/Discharge Planning: Goal: Ability to identify and utilize available resources and services will improve Outcome: Progressing Goal: Ability to manage health-related needs will improve Outcome: Progressing   Problem: Metabolic: Goal: Ability to maintain appropriate glucose levels will improve Outcome: Progressing   Problem: Nutritional: Goal: Maintenance of adequate nutrition will improve Outcome: Progressing   Problem: Skin Integrity: Goal: Risk for impaired skin integrity will decrease Outcome: Progressing   Problem: Health Behavior/Discharge Planning: Goal: Ability to manage health-related needs will improve Outcome: Progressing   Problem: Clinical Measurements: Goal: Ability to maintain clinical measurements within normal limits will improve Outcome: Progressing Goal: Will remain free from infection Outcome: Progressing Goal: Respiratory complications will improve Outcome: Progressing   Problem: Activity: Goal: Risk for activity intolerance will decrease Outcome: Progressing   Problem: Nutrition: Goal: Adequate nutrition will be maintained Outcome: Progressing   Problem: Coping: Goal: Level of anxiety will decrease Outcome: Progressing   Problem: Elimination: Goal: Will not experience complications related to bowel motility Outcome: Progressing Goal: Will not experience complications related to urinary retention Outcome: Progressing   Problem: Pain Managment: Goal: General experience of comfort will improve Outcome: Progressing   Problem: Safety: Goal: Ability to remain free from injury will improve Outcome: Progressing   Problem: Skin Integrity: Goal: Risk for impaired skin integrity will decrease Outcome: Progressing

## 2019-11-15 NOTE — Consult Note (Addendum)
Reason for Consult: Ulceration left great toe. Referring Physician: Kavir Savoca is an 69 y.o. male.  HPI: This is a 69 year old male with diabetes and associated neuropathy as well as peripheral vascular disease.  Managed outpatient with Dr. Luana Shu.  States the ulceration has been dry recently with no active drainage.  He has been applying Betadine and a bandage.  Past Medical History:  Diagnosis Date  . Allergy   . Atrial fibrillation ([place])   . B12 deficiency   . Chronic kidney disease   . Chronic kidney disease (CKD), stage II (mild)   . Coronary atherosclerosis   . Diabetes mellitus without complication ([place])   . Elevated PSA   . Esophagitis   . GERD (gastroesophageal reflux disease)   . Heart attack ([place])   . Heart disease   . Hyperlipidemia   . Hypertension   . Hypokalemia   . Mild neurocognitive disorder   . Osteoarthritis   . Presbyopia   . PVD (peripheral vascular disease) ([place])   . Restless leg syndrome   . Stroke (cerebrum) ([place])   . Stroke ([place])   . Subacute osteomyelitis ([place])   . Tinea unguium   . Uncompensated short term memory deficit     Past Surgical History:  Procedure Laterality Date  . CARDIAC PACEMAKER PLACEMENT    . COLONOSCOPY WITH PROPOFOL N/A 05/18/2018   Procedure: COLONOSCOPY WITH PROPOFOL;  Surgeon: [name], MD;  Location: [facility] ENDOSCOPY;  Service: Endoscopy;  Laterality: N/A;  . CORONARY ARTERY BYPASS GRAFT    . FOOT AMPUTATION Right   . LOWER EXTREMITY ANGIOGRAPHY Left 10/10/2019   Procedure: Lower Extremity Angiography;  Surgeon: Algernon Huxley, MD;  Location: [facility] LAB;  Service: Cardiovascular;  Laterality: Left;    Family History  Problem Relation Age of Onset  . Heart failure Mother   . Colon cancer Mother   . Diabetes Mother   . Thyroid disease Sister     Social History:  reports that he has never smoked. He has never used smokeless tobacco. He reports that he does not drink alcohol and does not  use drugs.  Allergies:  Allergies  Allergen Reactions  . Norvasc [Amlodipine Besylate]   . Penicillins     Childhood allergy, not sure what happens     Medications:  Scheduled: . atorvastatin  40 mg Oral QHS  . carvedilol  3.125 mg Oral BID WC  . Chlorhexidine Gluconate Cloth  6 each Topical Q0600  . famotidine  20 mg Oral Daily  . gabapentin  300 mg Oral BID  . heparin  5,000 Units Subcutaneous Q8H  . insulin aspart  0-15 Units Subcutaneous Q4H  . lisinopril  2.5 mg Oral Daily  . mupirocin ointment  1 application Nasal BID  . [START ON 11/16/2019] pneumococcal 23 valent vaccine  0.5 mL Intramuscular Tomorrow-1000  . tamsulosin  0.4 mg Oral Daily  . torsemide  20 mg Oral Daily     Results for orders placed or performed during the hospital encounter of 11/14/19 (from the past 48 hour(s))  Protime-INR     Status: Abnormal   Collection Time: 11/15/19 12:36 AM  Result Value Ref Range   Prothrombin Time 18.8 (H) 11.4 - 15.2 seconds   INR 1.6 (H) 0.8 - 1.2    Comment: (NOTE) INR goal varies based on device and disease states. Performed at [facility], [address] [place], [place] [address]   APTT     Status:  Abnormal   Collection Time: 11/15/19 12:36 AM  Result Value Ref Range   aPTT 37 (H) 24 - 36 seconds    Comment:        IF BASELINE aPTT IS ELEVATED, SUGGEST PATIENT RISK ASSESSMENT BE USED TO DETERMINE APPROPRIATE ANTICOAGULANT THERAPY. Performed at [facility], [address] [place], [place] [address]   Type and screen [place]     Status: None   Collection Time: 11/15/19 12:36 AM  Result Value Ref Range   ABO/RH(D) O POS    Antibody Screen NEG    Sample Expiration      11/18/2019,2359 Performed at [facility], [address] [place], [place] [address]   CBC with Differential/Platelet     Status: Abnormal   Collection Time: 11/15/19 12:36 AM  Result Value Ref Range   WBC 6.3 4.0 - 10.5 K/uL    RBC 3.72 (L) 4.22 - 5.81 MIL/uL   Hemoglobin 10.5 (L) 13.0 - 17.0 g/dL   HCT 34.0 (L) 39 - 52 %   MCV 91.4 80.0 - 100.0 fL   MCH 28.2 26.0 - 34.0 pg   MCHC 30.9 30.0 - 36.0 g/dL   RDW 14.5 11.5 - 15.5 %   Platelets PLATELET CLUMPS NOTED ON SMEAR, UNABLE TO ESTIMATE 150 - 400 K/uL    Comment: Immature Platelet Fraction may be clinically indicated, consider ordering this additional test [id number] PLATELET CLUMPS NOTED ON SMEAR, UNABLE TO ESTIMATE    nRBC 0.0 0.0 - 0.2 %   Neutrophils Relative % 70 %   Neutro Abs 4.4 1.7 - 7.7 K/uL   Lymphocytes Relative 13 %   Lymphs Abs 0.8 0.7 - 4.0 K/uL   Monocytes Relative 8 %   Monocytes Absolute 0.5 0 - 1 K/uL   Eosinophils Relative 8 %   Eosinophils Absolute 0.5 0 - 0 K/uL   Basophils Relative 1 %   Basophils Absolute 0.0 0 - 0 K/uL   Immature Granulocytes 0 %   Abs Immature Granulocytes 0.02 0.00 - 0.07 K/uL    Comment: Performed at [facility], [address], [place], [place] [address]  Basic metabolic panel     Status: Abnormal   Collection Time: 11/15/19 12:36 AM  Result Value Ref Range   Sodium 136 135 - 145 mmol/L   Potassium 4.1 3.5 - 5.1 mmol/L   Chloride 103 98 - 111 mmol/L   CO2 21 (L) 22 - 32 mmol/L   Glucose, Bld 198 (H) 70 - 99 mg/dL    Comment: Glucose reference range applies only to samples taken after fasting for at least 8 hours.   BUN 40 (H) 8 - 23 mg/dL   Creatinine, Ser 1.57 (H) 0.61 - 1.24 mg/dL   Calcium 8.9 8.9 - 10.3 mg/dL   GFR calc non Af Amer 45 (L) >60 mL/min   GFR calc Af Amer 52 (L) >60 mL/min   Anion gap 12 5 - 15    Comment: Performed at [facility], [address] [place], [place] [address]  SARS Coronavirus 2 by RT PCR (hospital order, performed in [facility] hospital lab) Nasopharyngeal Nasopharyngeal Swab     Status: None   Collection Time: 11/15/19 12:36 AM   Specimen: Nasopharyngeal Swab  Result Value Ref Range   SARS Coronavirus 2 NEGATIVE NEGATIVE    Comment:  (NOTE) SARS-CoV-2 target nucleic acids are NOT DETECTED.  The SARS-CoV-2 RNA is generally detectable in upper and lower respiratory specimens  during the acute phase of infection. The lowest concentration of SARS-CoV-2 viral copies this assay can detect is 250 copies / mL. A negative result does not preclude SARS-CoV-2 infection and should not be used as the sole basis for treatment or other patient management decisions.  A negative result may occur with improper specimen collection / handling, submission of specimen other than nasopharyngeal swab, presence of viral mutation(s) within the areas targeted by this assay, and inadequate number of viral copies (<250 copies / mL). A negative result must be combined with clinical observations, patient history, and epidemiological information.  Fact Sheet for Patients:   StrictlyIdeas.no  Fact Sheet for Healthcare Providers: BankingDealers.co.za  This test is not yet approved or  cleared by the [place] FDA and has been authorized for detection and/or diagnosis of SARS-CoV-2 by FDA under an Emergency Use Authorization (EUA).  This EUA will remain in effect (meaning this test can be used) for the duration of the COVID-19 declaration under Section 564(b)(1) of the Act, 21 U.S.C. section 360bbb-3(b)(1), unless the authorization is terminated or revoked sooner.  Performed at [facility], [address] [place], [place] [address]   Glucose, capillary     Status: Abnormal   Collection Time: 11/15/19  2:16 AM  Result Value Ref Range   Glucose-Capillary 168 (H) 70 - 99 mg/dL    Comment: Glucose reference range applies only to samples taken after fasting for at least 8 hours.   Comment 1 Notify RN   Glucose, capillary     Status: Abnormal   Collection Time: 11/15/19  4:03 AM  Result Value Ref Range   Glucose-Capillary 153 (H) 70 - 99 mg/dL    Comment: Glucose reference range  applies only to samples taken after fasting for at least 8 hours.  Surgical pcr screen     Status: Abnormal   Collection Time: 11/15/19  4:05 AM   Specimen: Nasal Mucosa; Nasal Swab  Result Value Ref Range   MRSA, PCR POSITIVE (A) NEGATIVE    Comment: RESULT CALLED TO, READ BACK BY AND VERIFIED WITH: Jamse Belfast RN [phone] 11/15/19 HNM    Staphylococcus aureus POSITIVE (A) NEGATIVE    Comment: (NOTE) The Xpert SA Assay (FDA approved for NASAL specimens in patients 23 years of age and older), is one component of a comprehensive surveillance program. It is not intended to diagnose infection nor to guide or monitor treatment. Performed at [facility], [address] [place], [place] [address]   Urinalysis, Routine w reflex microscopic     Status: Abnormal   Collection Time: 11/15/19  5:49 AM  Result Value Ref Range   Color, Urine STRAW (A) YELLOW   APPearance CLEAR (A) CLEAR   Specific Gravity, Urine 1.015 1.005 - 1.030   pH 5.0 5.0 - 8.0   Glucose, UA >=500 (A) NEGATIVE mg/dL   Hgb urine dipstick NEGATIVE NEGATIVE   Bilirubin Urine NEGATIVE NEGATIVE   Ketones, ur 5 (A) NEGATIVE mg/dL   Protein, ur NEGATIVE NEGATIVE mg/dL   Nitrite NEGATIVE NEGATIVE   Leukocytes,Ua NEGATIVE NEGATIVE   RBC / HPF 0-5 0 - 5 RBC/hpf   WBC, UA NONE SEEN 0 - 5 WBC/hpf   Bacteria, UA NONE SEEN NONE SEEN   Squamous Epithelial / LPF NONE SEEN 0 - 5    Comment: Performed at [facility], [address] [place], [place] [address]  Glucose, capillary     Status: Abnormal   Collection Time: 11/15/19  6:24 AM  Result Value Ref Range   Glucose-Capillary 125 (H) 70 - 99 mg/dL    Comment: Glucose reference range applies only to samples taken after fasting for at least 8 hours.  Glucose, capillary     Status: Abnormal   Collection Time: 11/15/19  7:45 AM  Result Value Ref Range   Glucose-Capillary 109 (H) 70 - 99 mg/dL    Comment: Glucose reference range applies only to samples taken  after fasting for at least 8 hours.    DG Chest Portable 1 View  Result Date: 11/15/2019 CLINICAL DATA:  Preop EXAM: PORTABLE CHEST 1 VIEW COMPARISON:  March 11, 2019 FINDINGS: The heart size and mediastinal contours are within normal limits. Aortic knob calcifications are seen. A left-sided pacemaker seen with the lead tip in the right ventricle. Overlying median sternotomy wires are present. Both lungs are clear. The visualized skeletal structures are unremarkable. IMPRESSION: No active disease. Electronically Signed   By: Prudencio Pair M.D.   On: 11/15/2019 00:56   DG Hip Unilat W or Wo Pelvis 2-3 Views Right  Result Date: 11/15/2019 CLINICAL DATA:  Fall EXAM: DG HIP (WITH OR WITHOUT PELVIS) 2-3V RIGHT COMPARISON:  None. FINDINGS: There is a nondisplaced fracture involving the right intratrochanteric region extending through the lesser trochanter. There is also cortical step-off seen at the lateral corner of the femoral head neck junction which could represent a nondisplaced fracture. There is diffuse osteopenia. Scattered dense vascular calcifications are noted. IMPRESSION: Nondisplaced right intratrochanteric fracture with overlying soft tissue swelling. Electronically Signed   By: Prudencio Pair M.D.   On: 11/15/2019 00:21    Review of Systems  Constitutional: Negative for chills and fever.  HENT: Negative for congestion and sore throat.   Respiratory: Negative for cough and shortness of breath.   Cardiovascular: Negative for chest pain and palpitations.  Gastrointestinal: Negative for nausea and vomiting.  Endocrine: Negative for polydipsia and polyuria.  Genitourinary: Negative for dysuria and urgency.  Musculoskeletal:       Recent fall with severe pain from fracture in his right hip.  Relates previous amputations on his right forefoot  Skin:       Chronic sore for the past month on his left great toe.  Does not relate any current drainage.  Neurological:       Does relate neuropathy  associated with his diabetes  Psychiatric/Behavioral: Negative for agitation. The patient is not nervous/anxious.    Blood pressure 115/60, pulse 89, temperature 98.2 F (36.8 C), temperature source Oral, resp. rate 18, weight 81.6 kg, SpO2 95 %. Physical Exam  Cardiovascular:  DP pulse is diminished but palpable.  PT pulse could not be palpated clearly.  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  Range of motion and muscle testing deferred due to hip fracture.  Neurological:  Loss of protective threshold distally in the left foot.  Skin:  The skin is warm dry and supple.  Diminished hair growth.  Stable black fibrotic eschar overlying ulceration beneath the left hallux present.  No active drainage.      Assessment/Plan: Assessment: 1.  Full-thickness ulceration left hallux with stable dry eschar. 2.  Diabetes with peripheral neuropathy. 3.  Peripheral vascular disease.  Plan: Betadine and a sterile bandage applied to the left great toe.  At this point the toe appears dry and stable and would just recommend outpatient follow-up as I do not think this requires any type of emergent amputation although the patient is  aware that at some point in the future this is a distinct possibility.  Does not appear to be any sign of infection related to the toe.  At this point I do not think it needs to be addressed prior to fixation of his hip fracture unless deemed necessary by orthopedics.  Recommend continued Betadine and light bandaging to the toe every couple of days until discharge and then follow-up outpatient as previously scheduled.  Durward Fortes 11/15/2019, 8:21 AM

## 2019-11-15 NOTE — ED Notes (Signed)
REPORT PROVIDED TO Eyvonne Mechanic, RN

## 2019-11-15 NOTE — ED Notes (Signed)
Attempted to call report but informed that there is a wait for a low bed. Charge made aware that floor is requesting 15 minutes.

## 2019-11-16 ENCOUNTER — Inpatient Hospital Stay: Payer: No Typology Code available for payment source | Admitting: Certified Registered"

## 2019-11-16 ENCOUNTER — Encounter: Payer: Self-pay | Admitting: Internal Medicine

## 2019-11-16 ENCOUNTER — Encounter
Admission: AD | Disposition: A | Discharge status: Home Health | Payer: Self-pay | Source: Home / Self Care | Attending: Internal Medicine

## 2019-11-16 ENCOUNTER — Inpatient Hospital Stay: Payer: No Typology Code available for payment source

## 2019-11-16 HISTORY — PX: INTRAMEDULLARY (IM) NAIL INTERTROCHANTERIC: SHX5875

## 2019-11-16 LAB — CBC
HCT: 30.4 % — ABNORMAL LOW (ref 39.0–52.0)
Hemoglobin: 9.6 g/dL — ABNORMAL LOW (ref 13.0–17.0)
MCH: 29 pg (ref 26.0–34.0)
MCHC: 31.6 g/dL (ref 30.0–36.0)
MCV: 91.8 fL (ref 80.0–100.0)
Platelets: 140 10*3/uL — ABNORMAL LOW (ref 150–400)
RBC: 3.31 MIL/uL — ABNORMAL LOW (ref 4.22–5.81)
RDW: 14.6 % (ref 11.5–15.5)
WBC: 6.7 10*3/uL (ref 4.0–10.5)
nRBC: 0 % (ref 0.0–0.2)

## 2019-11-16 LAB — BASIC METABOLIC PANEL
Anion gap: 8 (ref 5–15)
BUN: 32 mg/dL — ABNORMAL HIGH (ref 8–23)
CO2: 25 mmol/L (ref 22–32)
Calcium: 8.8 mg/dL — ABNORMAL LOW (ref 8.9–10.3)
Chloride: 104 mmol/L (ref 98–111)
Creatinine, Ser: 1.68 mg/dL — ABNORMAL HIGH (ref 0.61–1.24)
GFR calc Af Amer: 48 mL/min — ABNORMAL LOW (ref >60)
GFR calc non Af Amer: 42 mL/min — ABNORMAL LOW (ref >60)
Glucose, Bld: 132 mg/dL — ABNORMAL HIGH (ref 70–99)
Potassium: 3.8 mmol/L (ref 3.5–5.1)
Sodium: 137 mmol/L (ref 135–145)

## 2019-11-16 LAB — PROTIME-INR
INR: 1.5 — ABNORMAL HIGH (ref 0.8–1.2)
Prothrombin Time: 17.5 seconds — ABNORMAL HIGH (ref 11.4–15.2)

## 2019-11-16 LAB — GLUCOSE, CAPILLARY
Glucose-Capillary: 141 mg/dL — ABNORMAL HIGH (ref 70–99)
Glucose-Capillary: 119 mg/dL — ABNORMAL HIGH (ref 70–99)
Glucose-Capillary: 108 mg/dL — ABNORMAL HIGH (ref 70–99)
Glucose-Capillary: 116 mg/dL — ABNORMAL HIGH (ref 70–99)
Glucose-Capillary: 138 mg/dL — ABNORMAL HIGH (ref 70–99)
Glucose-Capillary: 144 mg/dL — ABNORMAL HIGH (ref 70–99)
Glucose-Capillary: 176 mg/dL — ABNORMAL HIGH (ref 70–99)

## 2019-11-16 LAB — APTT: aPTT: 46 seconds — ABNORMAL HIGH (ref 24–36)

## 2019-11-16 SURGERY — FIXATION, FRACTURE, INTERTROCHANTERIC, WITH INTRAMEDULLARY ROD
Anesthesia: General | Laterality: Right

## 2019-11-16 MED ORDER — PROPOFOL 500 MG/50ML IV EMUL
INTRAVENOUS | Status: AC
  Filled 2019-11-16: qty 50

## 2019-11-16 MED ORDER — PROPOFOL 10 MG/ML IV BOLUS
INTRAVENOUS | Status: DC | PRN
  Administered 2019-11-16: 100 mg via INTRAVENOUS
  Administered 2019-11-16: 20 mg via INTRAVENOUS

## 2019-11-16 MED ORDER — ONDANSETRON HCL 4 MG/2ML IJ SOLN: 4.0000 mg | Freq: Four times a day (QID) | INTRAMUSCULAR | Status: DC | PRN

## 2019-11-16 MED ORDER — POLYETHYLENE GLYCOL 3350 17 G PO PACK
17.0000 g | PACK | Freq: Every day | ORAL | Status: DC | PRN
  Administered 2019-11-17: 17 g via ORAL
  Filled 2019-11-16: qty 1

## 2019-11-16 MED ORDER — ROCURONIUM BROMIDE 10 MG/ML (PF) SYRINGE
PREFILLED_SYRINGE | INTRAVENOUS | Status: AC
  Filled 2019-11-16: qty 10

## 2019-11-16 MED ORDER — FENTANYL CITRATE (PF) 100 MCG/2ML IJ SOLN
INTRAMUSCULAR | Status: DC | PRN
  Administered 2019-11-16 (×2): 12.5 ug via INTRAVENOUS
  Administered 2019-11-16: 50 ug via INTRAVENOUS

## 2019-11-16 MED ORDER — VANCOMYCIN HCL IN DEXTROSE 1-5 GM/200ML-% IV SOLN
INTRAVENOUS | Status: AC
  Filled 2019-11-16: qty 200

## 2019-11-16 MED ORDER — SENNA 8.6 MG PO TABS
1.0000 | ORAL_TABLET | Freq: Two times a day (BID) | ORAL | Status: DC
  Administered 2019-11-16 – 2019-11-24 (×14): 8.6 mg via ORAL
  Filled 2019-11-16 (×15): qty 1

## 2019-11-16 MED ORDER — VANCOMYCIN HCL 1000 MG IV SOLR
INTRAVENOUS | Status: DC | PRN
  Administered 2019-11-16: 1000 mg via INTRAVENOUS

## 2019-11-16 MED ORDER — GENTAMICIN SULFATE 40 MG/ML IJ SOLN
INTRAMUSCULAR | Status: AC
  Filled 2019-11-16: qty 2

## 2019-11-16 MED ORDER — BISACODYL 10 MG RE SUPP
10.0000 mg | Freq: Every day | RECTAL | Status: DC | PRN
  Administered 2019-11-19: 10 mg via RECTAL
  Filled 2019-11-16: qty 1

## 2019-11-16 MED ORDER — LIDOCAINE HCL (PF) 2 % IJ SOLN
INTRAMUSCULAR | Status: AC
  Filled 2019-11-16: qty 5

## 2019-11-16 MED ORDER — ONDANSETRON HCL 4 MG/2ML IJ SOLN: 4.0000 mg | Freq: Once | INTRAMUSCULAR | Status: DC | PRN

## 2019-11-16 MED ORDER — ACETAMINOPHEN 500 MG PO TABS
500.0000 mg | ORAL_TABLET | Freq: Four times a day (QID) | ORAL | Status: AC
  Administered 2019-11-16 – 2019-11-17 (×3): 500 mg via ORAL
  Filled 2019-11-16 (×3): qty 1

## 2019-11-16 MED ORDER — KETOROLAC TROMETHAMINE 15 MG/ML IJ SOLN
7.5000 mg | Freq: Four times a day (QID) | INTRAMUSCULAR | Status: AC
  Administered 2019-11-16 – 2019-11-17 (×3): 7.5 mg via INTRAVENOUS
  Filled 2019-11-16 (×3): qty 1

## 2019-11-16 MED ORDER — MIDAZOLAM HCL 2 MG/2ML IJ SOLN
INTRAMUSCULAR | Status: DC | PRN
  Administered 2019-11-16 (×2): 1 mg via INTRAVENOUS

## 2019-11-16 MED ORDER — SODIUM CHLORIDE 0.9 % IR SOLN
Status: DC | PRN
  Administered 2019-11-16: 1000 mL

## 2019-11-16 MED ORDER — LIDOCAINE HCL (CARDIAC) PF 100 MG/5ML IV SOSY
PREFILLED_SYRINGE | INTRAVENOUS | Status: DC | PRN
  Administered 2019-11-16: 80 mg via INTRAVENOUS

## 2019-11-16 MED ORDER — ACETAMINOPHEN 325 MG PO TABS
325.0000 mg | ORAL_TABLET | Freq: Four times a day (QID) | ORAL | Status: DC | PRN
  Administered 2019-11-22 – 2019-11-23 (×2): 650 mg via ORAL
  Filled 2019-11-16 (×3): qty 2

## 2019-11-16 MED ORDER — EPINEPHRINE PF 1 MG/ML IJ SOLN
INTRAMUSCULAR | Status: AC
  Filled 2019-11-16: qty 1

## 2019-11-16 MED ORDER — FENTANYL CITRATE (PF) 100 MCG/2ML IJ SOLN
25.0000 ug | INTRAMUSCULAR | Status: DC | PRN
  Administered 2019-11-16 (×3): 25 ug via INTRAVENOUS

## 2019-11-16 MED ORDER — SODIUM CHLORIDE 0.9 % IV SOLN
INTRAVENOUS | Status: DC | PRN
  Administered 2019-11-16: 50 ug/min via INTRAVENOUS

## 2019-11-16 MED ORDER — ALUM & MAG HYDROXIDE-SIMETH 200-200-20 MG/5ML PO SUSP: 30.0000 mL | ORAL | Status: DC | PRN

## 2019-11-16 MED ORDER — VASOPRESSIN 20 UNIT/ML IV SOLN
INTRAVENOUS | Status: AC
  Filled 2019-11-16: qty 1

## 2019-11-16 MED ORDER — PHENOL 1.4 % MT LIQD
1.0000 | OROMUCOSAL | Status: DC | PRN
  Filled 2019-11-16: qty 177

## 2019-11-16 MED ORDER — SUGAMMADEX SODIUM 500 MG/5ML IV SOLN
INTRAVENOUS | Status: DC | PRN
  Administered 2019-11-16: 150 mg via INTRAVENOUS

## 2019-11-16 MED ORDER — SODIUM CHLORIDE 0.9 % IV SOLN: INTRAVENOUS | Status: DC | PRN

## 2019-11-16 MED ORDER — PROPOFOL 10 MG/ML IV BOLUS
INTRAVENOUS | Status: AC
  Filled 2019-11-16: qty 20

## 2019-11-16 MED ORDER — ONDANSETRON HCL 4 MG PO TABS: 4.0000 mg | ORAL_TABLET | Freq: Four times a day (QID) | ORAL | Status: DC | PRN

## 2019-11-16 MED ORDER — HYDROCODONE-ACETAMINOPHEN 7.5-325 MG PO TABS
1.0000 | ORAL_TABLET | ORAL | Status: DC | PRN
  Administered 2019-11-22: 1 via ORAL
  Filled 2019-11-16: qty 1

## 2019-11-16 MED ORDER — ROCURONIUM BROMIDE 100 MG/10ML IV SOLN
INTRAVENOUS | Status: DC | PRN
  Administered 2019-11-16: 20 mg via INTRAVENOUS
  Administered 2019-11-16: 50 mg via INTRAVENOUS

## 2019-11-16 MED ORDER — FENTANYL CITRATE (PF) 100 MCG/2ML IJ SOLN
INTRAMUSCULAR | Status: AC
  Administered 2019-11-16: 25 ug via INTRAVENOUS
  Filled 2019-11-16: qty 2

## 2019-11-16 MED ORDER — DEXAMETHASONE SODIUM PHOSPHATE 10 MG/ML IJ SOLN
INTRAMUSCULAR | Status: AC
  Filled 2019-11-16: qty 1

## 2019-11-16 MED ORDER — VANCOMYCIN HCL IN DEXTROSE 1-5 GM/200ML-% IV SOLN
1000.0000 mg | Freq: Two times a day (BID) | INTRAVENOUS | Status: AC
  Administered 2019-11-17: 1000 mg via INTRAVENOUS
  Filled 2019-11-16: qty 200

## 2019-11-16 MED ORDER — ONDANSETRON HCL 4 MG/2ML IJ SOLN
INTRAMUSCULAR | Status: DC | PRN
  Administered 2019-11-16: 4 mg via INTRAVENOUS

## 2019-11-16 MED ORDER — MORPHINE SULFATE (PF) 2 MG/ML IV SOLN: 0.5000 mg | INTRAVENOUS | Status: DC | PRN

## 2019-11-16 MED ORDER — DOCUSATE SODIUM 100 MG PO CAPS
100.0000 mg | ORAL_CAPSULE | Freq: Two times a day (BID) | ORAL | Status: DC
  Administered 2019-11-16 – 2019-11-24 (×16): 100 mg via ORAL
  Filled 2019-11-16 (×15): qty 1

## 2019-11-16 MED ORDER — MIDAZOLAM HCL 2 MG/2ML IJ SOLN
INTRAMUSCULAR | Status: AC
  Filled 2019-11-16: qty 2

## 2019-11-16 MED ORDER — MENTHOL 3 MG MT LOZG
1.0000 | LOZENGE | OROMUCOSAL | Status: DC | PRN
  Filled 2019-11-16: qty 9

## 2019-11-16 MED ORDER — HYDROCODONE-ACETAMINOPHEN 5-325 MG PO TABS
1.0000 | ORAL_TABLET | ORAL | Status: DC | PRN
  Administered 2019-11-17 – 2019-11-24 (×14): 2 via ORAL
  Filled 2019-11-16 (×15): qty 2

## 2019-11-16 MED ORDER — FENTANYL CITRATE (PF) 100 MCG/2ML IJ SOLN
INTRAMUSCULAR | Status: AC
  Filled 2019-11-16: qty 2

## 2019-11-16 MED ORDER — ASPIRIN EC 81 MG PO TBEC
81.0000 mg | DELAYED_RELEASE_TABLET | Freq: Every day | ORAL | Status: DC
  Administered 2019-11-16 – 2019-11-24 (×9): 81 mg via ORAL
  Filled 2019-11-16 (×9): qty 1

## 2019-11-16 MED ORDER — MAGNESIUM CITRATE PO SOLN
1.0000 | Freq: Once | ORAL | Status: DC | PRN
  Filled 2019-11-16: qty 296

## 2019-11-16 MED ORDER — TRAMADOL HCL 50 MG PO TABS
50.0000 mg | ORAL_TABLET | Freq: Four times a day (QID) | ORAL | Status: DC
  Administered 2019-11-16 – 2019-11-24 (×27): 50 mg via ORAL
  Filled 2019-11-16 (×28): qty 1

## 2019-11-16 MED ORDER — APIXABAN 5 MG PO TABS
5.0000 mg | ORAL_TABLET | Freq: Two times a day (BID) | ORAL | Status: DC
  Administered 2019-11-17 – 2019-11-20 (×7): 5 mg via ORAL
  Filled 2019-11-16 (×7): qty 1

## 2019-11-16 MED ORDER — PHENYLEPHRINE HCL (PRESSORS) 10 MG/ML IV SOLN
INTRAVENOUS | Status: DC | PRN
  Administered 2019-11-16: 100 ug via INTRAVENOUS
  Administered 2019-11-16 (×3): 200 ug via INTRAVENOUS
  Administered 2019-11-16 (×2): 100 ug via INTRAVENOUS

## 2019-11-16 SURGICAL SUPPLY — 43 items
BIT DRILL CANN LG 4.3MM (BIT) ×1 IMPLANT
BNDG COHESIVE 6X5 TAN STRL LF (GAUZE/BANDAGES/DRESSINGS) ×4 IMPLANT
CANISTER SUCT 1200ML W/VALVE (MISCELLANEOUS) ×2 IMPLANT
COVER WAND RF STERILE (DRAPES) ×2 IMPLANT
CRADLE LAMINECT ARM (MISCELLANEOUS) ×2 IMPLANT
DRAPE 3/4 80X56 (DRAPES) ×4 IMPLANT
DRAPE SURG 17X11 SM STRL (DRAPES) ×4 IMPLANT
DRAPE U-SHAPE 47X51 STRL (DRAPES) ×2 IMPLANT
DRILL BIT CANN LG 4.3MM (BIT) ×2
DRSG OPSITE POSTOP 3X4 (GAUZE/BANDAGES/DRESSINGS) IMPLANT
DRSG OPSITE POSTOP 4X14 (GAUZE/BANDAGES/DRESSINGS) ×2 IMPLANT
DRSG OPSITE POSTOP 4X6 (GAUZE/BANDAGES/DRESSINGS) IMPLANT
DURAPREP 26ML APPLICATOR (WOUND CARE) ×4 IMPLANT
ELECT REM PT RETURN 9FT ADLT (ELECTROSURGICAL) ×2
ELECTRODE REM PT RTRN 9FT ADLT (ELECTROSURGICAL) ×1 IMPLANT
GAUZE SPONGE 4X4 12PLY STRL (GAUZE/BANDAGES/DRESSINGS) ×2 IMPLANT
GLOVE BIOGEL PI IND STRL 9 (GLOVE) ×1 IMPLANT
GLOVE BIOGEL PI INDICATOR 9 (GLOVE) ×1
GLOVE SURG 9.0 ORTHO LTXF (GLOVE) ×4 IMPLANT
GOWN STRL REUS TWL 2XL XL LVL4 (GOWN DISPOSABLE) ×2 IMPLANT
GOWN STRL REUS W/ TWL LRG LVL3 (GOWN DISPOSABLE) ×1 IMPLANT
GOWN STRL REUS W/TWL LRG LVL3 (GOWN DISPOSABLE) ×2
GUIDEPIN VERSANAIL DSP 3.2X444 (ORTHOPEDIC DISPOSABLE SUPPLIES) ×2 IMPLANT
GUIDEWIRE BALL NOSE 100CM (WIRE) ×2 IMPLANT
HEMOVAC 400CC 10FR (MISCELLANEOUS) IMPLANT
HIP FR NAIL LAG SCREW 10.5X110 (Orthopedic Implant) ×2 IMPLANT
KIT TURNOVER CYSTO (KITS) ×2 IMPLANT
MAT ABSORB  FLUID 56X50 GRAY (MISCELLANEOUS) ×2
MAT ABSORB FLUID 56X50 GRAY (MISCELLANEOUS) ×1 IMPLANT
NAIL HIP FRACT 130D 11X180 (Screw) ×2 IMPLANT
NS IRRIG 1000ML POUR BTL (IV SOLUTION) ×2 IMPLANT
PACK HIP COMPR (MISCELLANEOUS) ×2 IMPLANT
SCREW BONE CORTICAL 5.0X38 (Screw) ×2 IMPLANT
SCREW BONE CORTICAL 5.0X40 (Screw) ×2 IMPLANT
SCREW LAG HIP FR NAIL 10.5X110 (Orthopedic Implant) ×1 IMPLANT
STAPLER SKIN PROX 35W (STAPLE) ×2 IMPLANT
SUCTION FRAZIER HANDLE 10FR (MISCELLANEOUS) ×2
SUCTION TUBE FRAZIER 10FR DISP (MISCELLANEOUS) ×1 IMPLANT
SUT VIC AB 0 CT1 36 (SUTURE) ×4 IMPLANT
SUT VIC AB 2-0 CT1 27 (SUTURE) ×2
SUT VIC AB 2-0 CT1 TAPERPNT 27 (SUTURE) ×1 IMPLANT
SUT VICRYL 0 AB UR-6 (SUTURE) ×2 IMPLANT
SYR 30ML LL (SYRINGE) ×2 IMPLANT

## 2019-11-16 NOTE — Transfer of Care (Signed)
Immediate Anesthesia Transfer of Care Note  Patient: Jason Montes  Procedure(s) Performed: INTRAMEDULLARY (IM) NAIL INTERTROCHANTRIC (Right )  Patient Location: PACU  Anesthesia Type:General  Level of Consciousness: drowsy and patient cooperative  Airway & Oxygen Therapy: Patient Spontanous Breathing and Patient connected to face mask oxygen  Post-op Assessment: Report given to RN and Post -op Vital signs reviewed and stable  Post vital signs: Reviewed and stable  Last Vitals:  Vitals Value Taken Time  BP 115/53 11/16/19 1445  Temp 36.4 C 11/16/19 1445  Pulse 90 11/16/19 1450  Resp 22 11/16/19 1450  SpO2 100 % 11/16/19 1450  Vitals shown include unvalidated device data.  Last Pain:  Vitals:   11/16/19 1445  TempSrc:   PainSc: 0-No pain      Patients Stated Pain Goal: 0 (47/42/59 [id number])  Complications: No complications documented.

## 2019-11-16 NOTE — Anesthesia Procedure Notes (Signed)
Procedure Name: Intubation Date/Time: 11/16/2019 12:51 PM Performed by: Nolon Lennert, RN Pre-anesthesia Checklist: Patient identified, Emergency Drugs available, Suction available and Patient being monitored Patient Re-evaluated:Patient Re-evaluated prior to induction Oxygen Delivery Method: Circle system utilized Preoxygenation: Pre-oxygenation with 100% oxygen Induction Type: IV induction Ventilation: Mask ventilation without difficulty Laryngoscope Size: McGraph and 4 Grade View: Grade I Tube type: Oral Tube size: 7.5 mm Number of attempts: 1 Airway Equipment and Method: Stylet and Video-laryngoscopy Placement Confirmation: ETT inserted through vocal cords under direct vision,  positive ETCO2 and breath sounds checked- equal and bilateral Secured at: 22 cm Tube secured with: Tape Dental Injury: Teeth and Oropharynx as per pre-operative assessment

## 2019-11-16 NOTE — Op Note (Signed)
DATE OF SURGERY:  11/16/2019  TIME: 3:00 PM  PATIENT NAME:  Jason Montes  AGE: 69 y.o.  PRE-OPERATIVE DIAGNOSIS:  Right Intertrochanteric Hip Fracture  POST-OPERATIVE DIAGNOSIS:  SAME  PROCEDURE:  INTRAMEDULLARY FIXATION OF RIGHT INTERTROCHANTERIC HIP FRACTURE  SURGEON:  [place]  OPERATIVE IMPLANTS: Biomet short Affixus nail 11 x 177mm, 110 mm lag screw with a 38 mm distal interlocking screw  PREOPERATIVE INDICATIONS:  JETER Montes is a 69 y.o. year old who fell and suffered a hip fracture. He was brought into the ER and then admitted and medically cleared for surgical intervention.    The risks, benefits and alternatives were discussed with the patient and their family.  The risks include but are not limited to infection, bleeding, nerve or blood vessel injury, malunion, nonunion, hardware prominence, hardware failure, change in leg lengths or lower extremity rotation need for further surgery including hardware removal with conversion to a total hip arthroplasty. Medical risks include but are not limited to DVT and pulmonary embolism, myocardial infarction, stroke, pneumonia, respiratory failure and death. The patient and their family understood these risks and wished to proceed with surgery.  OPERATIVE PROCEDURE:  The patient was brought to the operating room and placed in the supine position on the fracture table. General anesthesia was administered.  A time out was performed to verify the patient's name, date of birth, medical record number, correct site of surgery correct procedure to be performed. The timeout was also used to verify the patient received antibiotics and all appropriate instruments, implants and radiographic studies were available in the room. Once all in attendance were in agreement, the case began. A closed reduction was performed under C-arm guidance.  The fracture reduction was confirmed on both AP and lateral views.  The patient was prepped and  draped in a sterile fashion. He received 1 gram of ancef IV for preoperative antibiotics due to a PCN allergy.  An incision was made proximal to the greater trochanter in line with the femur. A guidewire was placed over the tip of the greater trochanter and advanced into the proximal femur to the level of the lesser trochanter.  Confirmation of the drill pin position was made on AP and lateral C-arm images.  The threaded guidepin was then overdrilled with the proximal femoral drill.  The nail was then inserted into the proximal femur but could not be advanced by hand.  Sequential reamers were used to open the intramedullary canal up to 13 mm.  The short nail was then advanced across the fracture site and into the femoral shaft. Its position was confirmed on AP and lateral C-arm images.   Once the nail was completely seated, the drill guide for the lag screw was placed through the guide arm for the Affixus nail. A guidepin was then placed through this drill guide and advanced through the lateral cortex of the femur, across the fracture site and into the femoral head achieving a tip apex distance of less than 25 mm. The depth of the drill pin was measured to be 110 mm, and then the drill for the lag screw was advanced over the guidepin and through the lateral cortex, across the fracture site and up into the femoral head to the depth previously measured.  The lag screw was then advanced by hand into position across the fracture site into the femoral head. Its final position was confirmed on AP and lateral C-arm images. Compression was applied as traction was carefully released. The  set screw in the top of the intramedullary rod was tightened by hand using a screwdriver. It was backed off a quarter turn to allow for compression at the fracture site.  The drill sleeve for the distal interlocking screw was then placed through the Affixus guide arm. A small stab incision was made to allow the drill guide to  approximate the lateral cortex of the femur. The drill for the distal interlocking screw was then advanced bicortically. The depth of this drill was measured. A distal interlocking screw with the length of 40 was then inserted by hand through the guide arm.  On lateral C arm imaging of the distal interlocking screw was found to have been placed posterior to the intramedullary rod.  It was therefore removed.  A second drill hole was placed through the dynamic screw hole.  The diameter of the cortex at this position was measured to be 38 mm.  A 38 mm screw was then placed by hand.  Final C-arm images of the entire intramedullary construct were taken in both the AP and lateral planes.   The wounds were irrigated copiously and closed with 0 Vicryl for closure of the deep fascia and 2-0 Vicryl for subcutaneous closure. The skin was approximated with staples. A dry sterile dressing was applied. I was scrubbed and present the entire case and all sharp and instrument counts were correct at the conclusion of the case. Patient was transferred to hospital bed and brought to PACU in stable condition.    [name], MD

## 2019-11-16 NOTE — Progress Notes (Signed)
PROGRESS NOTE    SEARCY Montes  XKG:[id number] DOB: 1951-05-14 DOA: 11/14/2019 PCP: [facility]   Assessment & Plan:   Principal Problem:   Intertrochanteric fracture of right femur, closed, initial encounter ([place]) Active Problems:   Atrial fibrillation ([place])   CAD (coronary artery disease)   DM (diabetes mellitus), type 2, uncontrolled with complications ([place])   Chronic diastolic heart failure (HCC)   Chronic anticoagulation   Essential hypertension   PVD (peripheral vascular disease) ([place])   Accidental fall   Preoperative clearance   Closed right hip fracture, initial encounter ([place])   Intertrochanteric fracture of right femur: secondary to accidental fall. With severe peripheral vascular disease s/p recent revascularization. Will go for surgery today   PAF: continue on carvedilol. Hold home dose of eliquis for surgery.   CAD: no chest pain. Continue on carvedilol, lisinopril  DM2: poorly controlled. HbA1c 13.8. Continue on lantus, SSI w/ accuchecks   Chronic systolic heart failure: appears euvolemic. Echo shows EF 70-26%, normal diastolic function. Continue carvedilol, torsemide,  lisinopril.   CKDII: baseline Cr is unknown. Cr is trending up today. Will continue to monitor   Essential hypertension: continue lisinopril, carvedilol   PVD: recently underwent revascularization left leg on 10/10/2019 by Dr. Lucky Cowboy due to nonhealing ulcer, still present. Hx of right transmetatarsal amputation  Diabetic foot ulcer left foot: recommends outpatient f/u as per podiatry. Continue w/ wound care   Thrombocytopenia: etiology unclear, mild. Will continue to monitor    DVT prophylaxis: heparin  Code Status: full  Family Communication:  Disposition Plan: depends on PT/OT recs   Consultants:   Ortho surg   Podiatry    Procedures:    Antimicrobials:    Subjective: Pt right hip pain   Objective: Vitals:   11/15/19 2002 11/15/19 2236 11/16/19 0424  11/16/19 0733  BP: 107/60 106/60 (!) 103/54 (!) 116/58  Pulse: 73 85 84 93  Resp: 18 20 16 18   Temp:  98.2 F (36.8 C) 97.6 F (36.4 C) 98.6 F (37 C)  TempSrc:  Oral Axillary Oral  SpO2: 99% 98% 93% 95%  Weight:        Intake/Output Summary (Last 24 hours) at 11/16/2019 0745 Last data filed at 11/16/2019 0251 Gross per 24 hour  Intake 720 ml  Output 300 ml  Net 420 ml   Filed Weights   11/15/19 0100  Weight: 81.6 kg    Examination:  General exam: Appears calm and comfortable  Respiratory system: Clear to auscultation. No rales, rhonchi  Cardiovascular system: S1/S2+. No  rubs, gallops or clicks.  Gastrointestinal system: Abdomen is nondistended, soft and nontender. Hypoactive bowel sounds heard. Central nervous system: Alert and oriented. No focal neurological deficits Psychiatry: Judgement and insight appear normal. Flat mood and affect     Data Reviewed: I have personally reviewed following labs and imaging studies  CBC: Recent Labs  Lab 11/15/19 0036 11/16/19 0419  WBC 6.3 6.7  NEUTROABS 4.4  --   HGB 10.5* 9.6*  HCT 34.0* 30.4*  MCV 91.4 91.8  PLT PLATELET CLUMPS NOTED ON SMEAR, UNABLE TO ESTIMATE 378*   Basic Metabolic Panel: Recent Labs  Lab 11/15/19 0036 11/16/19 0419  NA 136 137  K 4.1 3.8  CL 103 104  CO2 21* 25  GLUCOSE 198* 132*  BUN 40* 32*  CREATININE 1.57* 1.68*  CALCIUM 8.9 8.8*   GFR: Estimated Creatinine Clearance: 43.3 mL/min (A) (by C-G formula based on SCr of 1.68 mg/dL (H)). Liver Function Tests:  No results for input(s): AST, ALT, ALKPHOS, BILITOT, PROT, ALBUMIN in the last 168 hours. No results for input(s): LIPASE, AMYLASE in the last 168 hours. No results for input(s): AMMONIA in the last 168 hours. Coagulation Profile: Recent Labs  Lab 11/15/19 0036 11/16/19 0419  INR 1.6* 1.5*   Cardiac Enzymes: No results for input(s): CKTOTAL, CKMB, CKMBINDEX, TROPONINI in the last 168 hours. BNP (last 3 results) No results for  input(s): PROBNP in the last 8760 hours. HbA1C: Recent Labs    11/15/19 0049  HGBA1C 11.4*   CBG: Recent Labs  Lab 11/15/19 2007 11/15/19 2323 11/16/19 0241 11/16/19 0406 11/16/19 0734  GLUCAP 212* 170* 141* 119* 108*   Lipid Profile: No results for input(s): CHOL, HDL, LDLCALC, TRIG, CHOLHDL, LDLDIRECT in the last 72 hours. Thyroid Function Tests: No results for input(s): TSH, T4TOTAL, FREET4, T3FREE, THYROIDAB in the last 72 hours. Anemia Panel: No results for input(s): VITAMINB12, FOLATE, FERRITIN, TIBC, IRON, RETICCTPCT in the last 72 hours. Sepsis Labs: No results for input(s): PROCALCITON, LATICACIDVEN in the last 168 hours.  Recent Results (from the past 240 hour(s))  SARS Coronavirus 2 by RT PCR (hospital order, performed in [facility] hospital lab) Nasopharyngeal Nasopharyngeal Swab     Status: None   Collection Time: 11/15/19 12:36 AM   Specimen: Nasopharyngeal Swab  Result Value Ref Range Status   SARS Coronavirus 2 NEGATIVE NEGATIVE Final    Comment: (NOTE) SARS-CoV-2 target nucleic acids are NOT DETECTED.  The SARS-CoV-2 RNA is generally detectable in upper and lower respiratory specimens during the acute phase of infection. The lowest concentration of SARS-CoV-2 viral copies this assay can detect is 250 copies / mL. A negative result does not preclude SARS-CoV-2 infection and should not be used as the sole basis for treatment or other patient management decisions.  A negative result may occur with improper specimen collection / handling, submission of specimen other than nasopharyngeal swab, presence of viral mutation(s) within the areas targeted by this assay, and inadequate number of viral copies (<250 copies / mL). A negative result must be combined with clinical observations, patient history, and epidemiological information.  Fact Sheet for Patients:   StrictlyIdeas.no  Fact Sheet for Healthcare  Providers: BankingDealers.co.za  This test is not yet approved or  cleared by the [place] FDA and has been authorized for detection and/or diagnosis of SARS-CoV-2 by FDA under an Emergency Use Authorization (EUA).  This EUA will remain in effect (meaning this test can be used) for the duration of the COVID-19 declaration under Section 564(b)(1) of the Act, 21 U.S.C. section 360bbb-3(b)(1), unless the authorization is terminated or revoked sooner.  Performed at [facility], [address] [place], [place] [address]   Surgical pcr screen     Status: Abnormal   Collection Time: 11/15/19  4:05 AM   Specimen: Nasal Mucosa; Nasal Swab  Result Value Ref Range Status   MRSA, PCR POSITIVE (A) NEGATIVE Final    Comment: RESULT CALLED TO, READ BACK BY AND VERIFIED WITH: Jamse Belfast RN [phone] 11/15/19 HNM    Staphylococcus aureus POSITIVE (A) NEGATIVE Final    Comment: (NOTE) The Xpert SA Assay (FDA approved for NASAL specimens in patients 68 years of age and older), is one component of a comprehensive surveillance program. It is not intended to diagnose infection nor to guide or monitor treatment. Performed at [facility], [address] [place], [place] [address]          Radiology Studies:  DG Chest Portable 1 View  Result Date: 11/15/2019 CLINICAL DATA:  Preop EXAM: PORTABLE CHEST 1 VIEW COMPARISON:  March 11, 2019 FINDINGS: The heart size and mediastinal contours are within normal limits. Aortic knob calcifications are seen. A left-sided pacemaker seen with the lead tip in the right ventricle. Overlying median sternotomy wires are present. Both lungs are clear. The visualized skeletal structures are unremarkable. IMPRESSION: No active disease. Electronically Signed   By: Prudencio Pair M.D.   On: 11/15/2019 00:56   ECHOCARDIOGRAM COMPLETE  Result Date: 11/15/2019    ECHOCARDIOGRAM REPORT   Patient Name:   Jason Montes Date  of Exam: 11/15/2019 Medical Rec #:  [id number]           Height:       69.0 in Accession #:    [id number]          Weight:       179.9 lb Date of Birth:  Mar 18, 1951           BSA:          1.975 m Patient Age:    58 years            BP:           115/60 mmHg Patient Gender: M                   HR:           89 bpm. Exam Location:  [facility] Procedure: 2D Echo, Color Doppler and Cardiac Doppler Indications:     CHF- acute diastolic 242.35  History:         Patient has no prior history of Echocardiogram examinations.                  Stroke; Risk Factors:Hypertension. Heart attack, PVD.  Sonographer:     Sherrie Sport RDCS (AE) Referring Phys:  [id number] Athena Masse Diagnosing Phys: Bartholome Bill MD  Sonographer Comments: Technically difficult study due to poor echo windows, no apical window, no subcostal window and suboptimal parasternal window. IMPRESSIONS  1. Left ventricular ejection fraction, by estimation, is 40 to 45%. The left ventricle has mildly decreased function. The left ventricle has no regional wall motion abnormalities. The left ventricular internal cavity size was mildly dilated. Left ventricular diastolic parameters were normal.  2. Right ventricular systolic function is normal. The right ventricular size is normal. There is moderately elevated pulmonary artery systolic pressure.  3. The mitral valve was not well visualized. Trivial mitral valve regurgitation.  4. The aortic valve was not well visualized. Aortic valve regurgitation is trivial. FINDINGS  Left Ventricle: Left ventricular ejection fraction, by estimation, is 40 to 45%. The left ventricle has mildly decreased function. The left ventricle has no regional wall motion abnormalities. The left ventricular internal cavity size was mildly dilated. There is borderline left ventricular hypertrophy. Left ventricular diastolic parameters were normal. Right Ventricle: The right ventricular size is normal. No increase in right ventricular wall thickness. Right  ventricular systolic function is normal. There is moderately elevated pulmonary artery systolic pressure. The tricuspid regurgitant velocity is 3.03 m/s, and with an assumed right atrial pressure of 10 mmHg, the estimated right ventricular systolic pressure is 54.0 mmHg. Left Atrium: Left atrial size was normal in size. Right Atrium: Right atrial size was normal in size. Pericardium: There is no evidence of pericardial effusion. Mitral Valve: The mitral valve was not well visualized. Trivial mitral valve regurgitation. Tricuspid Valve: The tricuspid  valve is not well visualized. Tricuspid valve regurgitation is trivial. Aortic Valve: The aortic valve was not well visualized. Aortic valve regurgitation is trivial. There is no evidence of aortic valve vegetation. Pulmonic Valve: The pulmonic valve was not well visualized. Pulmonic valve regurgitation is not visualized. Aorta: The aortic root was not well visualized. IAS/Shunts: The interatrial septum was not assessed. Additional Comments: A pacer wire is visualized.  LEFT VENTRICLE PLAX 2D LVIDd:         5.42 cm LVIDs:         4.36 cm LV PW:         1.06 cm LV IVS:        0.89 cm LVOT diam:     2.30 cm LVOT Area:     4.15 cm  LEFT ATRIUM         Index LA diam:    4.90 cm 2.48 cm/m   AORTA Ao Root diam: 3.20 cm TRICUSPID VALVE TR Peak grad:   36.7 mmHg TR Vmax:        303.00 cm/s  SHUNTS Systemic Diam: 2.30 cm Bartholome Bill MD Electronically signed by Bartholome Bill MD Signature Date/Time: 11/15/2019/12:16:04 PM    Final    DG Hip Unilat W or Wo Pelvis 2-3 Views Right  Result Date: 11/15/2019 CLINICAL DATA:  Fall EXAM: DG HIP (WITH OR WITHOUT PELVIS) 2-3V RIGHT COMPARISON:  None. FINDINGS: There is a nondisplaced fracture involving the right intratrochanteric region extending through the lesser trochanter. There is also cortical step-off seen at the lateral corner of the femoral head neck junction which could represent a nondisplaced fracture. There is diffuse  osteopenia. Scattered dense vascular calcifications are noted. IMPRESSION: Nondisplaced right intratrochanteric fracture with overlying soft tissue swelling. Electronically Signed   By: Prudencio Pair M.D.   On: 11/15/2019 00:21        Scheduled Meds:  atorvastatin  40 mg Oral QHS   carvedilol  3.125 mg Oral BID WC   Chlorhexidine Gluconate Cloth  6 each Topical Q0600   famotidine  20 mg Oral Daily   gabapentin  300 mg Oral BID   heparin  5,000 Units Subcutaneous Q8H   insulin aspart  0-15 Units Subcutaneous Q4H   lisinopril  2.5 mg Oral Daily   mupirocin ointment  1 application Nasal BID   pneumococcal 23 valent vaccine  0.5 mL Intramuscular Tomorrow-1000   Ensure Max Protein  11 oz Oral Daily   tamsulosin  0.4 mg Oral Daily   torsemide  20 mg Oral Daily   Continuous Infusions:   LOS: 1 day    Time spent: 33 mins     Wyvonnia Dusky, MD [facility] Pager [phone]  If 7PM-7AM, please contact night-coverage [email or web address] 11/16/2019, 7:45 AM

## 2019-11-16 NOTE — Anesthesia Preprocedure Evaluation (Addendum)
Anesthesia Evaluation  Patient identified by MRN, date of birth, ID band Patient awake    Reviewed: Allergy & Precautions, H&P , NPO status , Patient's Chart, lab work & pertinent test results, reviewed documented beta blocker date and time   Airway Mallampati: II   Neck ROM: full    Dental  (+) Poor Dentition   Pulmonary neg pulmonary ROS,    Pulmonary exam normal        Cardiovascular Exercise Tolerance: Poor hypertension, On Medications + CAD, + Past MI, + Peripheral Vascular Disease and +CHF  Normal cardiovascular exam+ dysrhythmias Atrial Fibrillation  Rhythm:regular Rate:Normal     Neuro/Psych CVA, No Residual Symptoms negative psych ROS   GI/Hepatic Neg liver ROS, GERD  Medicated,  Endo/Other  negative endocrine ROSdiabetes  Renal/GU Renal disease  negative genitourinary   Musculoskeletal  (+) Arthritis , Osteoarthritis,    Abdominal   Peds negative pediatric ROS (+)  Hematology  (+) Blood dyscrasia, anemia ,   Anesthesia Other Findings Past Medical History: No date: Allergy No date: Atrial fibrillation (HCC) No date: B12 deficiency No date: Chronic kidney disease No date: Chronic kidney disease (CKD), stage II (mild) No date: Coronary atherosclerosis No date: Diabetes mellitus without complication (HCC) No date: Elevated PSA No date: Esophagitis No date: GERD (gastroesophageal reflux disease) No date: Heart attack ([place]) No date: Heart disease No date: Hyperlipidemia No date: Hypertension No date: Hypokalemia No date: Mild neurocognitive disorder No date: Osteoarthritis No date: Presbyopia No date: PVD (peripheral vascular disease) ([place]) No date: Restless leg syndrome No date: Stroke (cerebrum) (HCC) No date: Stroke ([place]) No date: Subacute osteomyelitis (HCC) No date: Tinea unguium No date: Uncompensated short term memory deficit Past Surgical History: No date: CARDIAC PACEMAKER  PLACEMENT No date: CORONARY ARTERY BYPASS GRAFT No date: FOOT AMPUTATION; Right BMI    Body Mass Index:  25.99 kg/m     Reproductive/Obstetrics negative OB ROS                             Anesthesia Physical  Anesthesia Plan  ASA: IV  Anesthesia Plan: General   Post-op Pain Management:    Induction: Intravenous  PONV Risk Score and Plan:   Airway Management Planned: Oral ETT  Additional Equipment:   Intra-op Plan:   Post-operative Plan: Extubation in OR  Informed Consent: I have reviewed the patients History and Physical, chart, labs and discussed the procedure including the risks, benefits and alternatives for the proposed anesthesia with the patient or authorized representative who has indicated his/her understanding and acceptance.     Dental Advisory Given  Plan Discussed with: CRNA  Anesthesia Plan Comments: (Prolonged INR... will do GOT.)       Anesthesia Quick Evaluation

## 2019-11-16 NOTE — Progress Notes (Signed)
Pt has a tracker for his parol provision in his R ankle. Called the parol officer Abagail Kitchens [phone] to inform them about the possible cutting of the tracker. Officer Vernia Buff [phone] called and said it is ok to have the tracker cut and she will pick up the tracker.

## 2019-11-16 NOTE — Progress Notes (Signed)
Patient appears optimized from a cardiac standpoint for surgery.  Going to the OR today.  Will sign off for now.  Please reconsult [place] if there are any cardiac issues postop.

## 2019-11-17 ENCOUNTER — Encounter: Payer: Self-pay | Admitting: Orthopedic Surgery

## 2019-11-17 DIAGNOSIS — I4819 Other persistent atrial fibrillation: Secondary | ICD-10-CM

## 2019-11-17 DIAGNOSIS — D638 Anemia in other chronic diseases classified elsewhere: Secondary | ICD-10-CM

## 2019-11-17 LAB — CBC
HCT: 26.8 % — ABNORMAL LOW (ref 39.0–52.0)
Hemoglobin: 8.4 g/dL — ABNORMAL LOW (ref 13.0–17.0)
MCH: 29.1 pg (ref 26.0–34.0)
MCHC: 31.3 g/dL (ref 30.0–36.0)
MCV: 92.7 fL (ref 80.0–100.0)
Platelets: 103 10*3/uL — ABNORMAL LOW (ref 150–400)
RBC: 2.89 MIL/uL — ABNORMAL LOW (ref 4.22–5.81)
RDW: 14.5 % (ref 11.5–15.5)
WBC: 7.3 10*3/uL (ref 4.0–10.5)
nRBC: 0 % (ref 0.0–0.2)

## 2019-11-17 LAB — GLUCOSE, CAPILLARY
Glucose-Capillary: 144 mg/dL — ABNORMAL HIGH (ref 70–99)
Glucose-Capillary: 181 mg/dL — ABNORMAL HIGH (ref 70–99)
Glucose-Capillary: 200 mg/dL — ABNORMAL HIGH (ref 70–99)
Glucose-Capillary: 214 mg/dL — ABNORMAL HIGH (ref 70–99)
Glucose-Capillary: 228 mg/dL — ABNORMAL HIGH (ref 70–99)
Glucose-Capillary: 228 mg/dL — ABNORMAL HIGH (ref 70–99)
Glucose-Capillary: 269 mg/dL — ABNORMAL HIGH (ref 70–99)

## 2019-11-17 LAB — BASIC METABOLIC PANEL
Anion gap: 9 (ref 5–15)
BUN: 35 mg/dL — ABNORMAL HIGH (ref 8–23)
CO2: 24 mmol/L (ref 22–32)
Calcium: 8.6 mg/dL — ABNORMAL LOW (ref 8.9–10.3)
Chloride: 100 mmol/L (ref 98–111)
Creatinine, Ser: 1.55 mg/dL — ABNORMAL HIGH (ref 0.61–1.24)
GFR calc Af Amer: 53 mL/min — ABNORMAL LOW (ref >60)
GFR calc non Af Amer: 46 mL/min — ABNORMAL LOW (ref >60)
Glucose, Bld: 215 mg/dL — ABNORMAL HIGH (ref 70–99)
Potassium: 4.2 mmol/L (ref 3.5–5.1)
Sodium: 133 mmol/L — ABNORMAL LOW (ref 135–145)

## 2019-11-17 NOTE — Evaluation (Signed)
Occupational Therapy Evaluation Patient Details Name: Jason Montes MRN: [id number] DOB: March 08, 1951 Today's Date: 11/17/2019    History of Present Illness Per MD notes: Pt is a 69 y.o. 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 Pt diagnosed with R Intertrochanteric hip fx and is s/p IM fixation.   Clinical Impression   [name] presents to OT with pain, ROM, and balance deficits that impact his ability to safely and independently complete functional tasks.  Pt is POD#1 from above surgery.  Prior to pt's fall and subsequent surgical repair, pt was grossly independent in all ADLs, occasionally using RW or SPC for safety in mobility.  Pt has a PCA who assists with household management tasks daily.  Currently, pt is very limited by R hip pain.  OTR was unable to assess pt's OOB mobility 2/2 pt's polite refusal and reports of fatigue/pain.  Per PT report, pt requires mod assist for bed mobility and min assist for sit to stand transfers and ambulating a couple steps with heavy use of RW.  Pt likely requires mod-max assist for lower body dressing 2/2 pain and limited ROM of R hip.  OTR provided education to pt on self care modifications and pain management strategies to promote independence and well being.  [name] will continue to benefit from skilled OT services in acute setting to address pain, safety, and independence in functional tasks.  SNF is most appropriate discharge recommendation at this time.    Follow Up Recommendations  SNF    Equipment Recommendations  Other (comment) (defer to post acute)    Recommendations for Other Services        Precautions / Restrictions Precautions Precautions: Fall Restrictions Weight Bearing Restrictions: Yes LUE Weight Bearing: Weight bearing as tolerated RLE Weight Bearing: Weight bearing as tolerated LLE Weight Bearing: Weight bearing as tolerated Other Position/Activity Restrictions: Pt with L diabetic foot ulcer, WBAT per Dr. Luana Shu vebal order 11/17/19 with a shoe donned, does not have to be a post-op shoe      Mobility Bed Mobility Overal bed mobility: Needs Assistance Bed Mobility: Supine to Sit;Sit to Supine     Supine to sit: Mod assist Sit to supine: Mod assist   General bed mobility comments: Unable to assess  Transfers Overall transfer level: Needs assistance Equipment used: Rolling walker (2 wheeled) Transfers: Sit to/from Stand Sit to Stand: Min assist         General transfer comment: Unable to assess    Balance Overall balance assessment: Needs assistance   Sitting balance-Leahy Scale: Good Sitting balance - Comments: unable to assess   Standing balance support: Bilateral upper extremity supported Standing balance-Leahy Scale: Fair Standing balance comment: unable to assess                           ADL either performed or assessed with clinical judgement   ADL Overall ADL's : Needs assistance/impaired                                       General ADL Comments: Pt requires setup/intermittent min assist for seated  ADLs 2/2 decreased use of RUE.  Unable to assess OOB mobility, but suspect pt requires mod-max assist for lower body dressing and bathing 2/2 post-op pain/ROM deficits.  Per PT report, pt was able to complete functional mobility with min A and use of RW.     Vision Baseline Vision/History: Wears glasses (pt with decreased vision in R eye at baseline) Wears Glasses: At all times Patient Visual Report: No change from baseline       Perception     Praxis      Pertinent Vitals/Pain Pain Assessment: Faces Pain  Score: 6  Faces Pain Scale: Hurts even more Pain Location: RLE.  Pt denies pain at rest but is unable to complete OOB mobility 2/2 pain. Pain Descriptors / Indicators: Aching;Sore Pain Intervention(s): Limited activity within patient's tolerance;Monitored during session     Hand Dominance Left   Extremity/Trunk Assessment Upper Extremity Assessment Upper Extremity Assessment: RUE deficits/detail RUE Deficits / Details: pt with significant sensation loss in RUE (minimal sensation of light touch, limited proprioception), full AROM in RUE, fair grip strength RUE Sensation: decreased light touch;decreased proprioception RUE Coordination: decreased fine motor   Lower Extremity Assessment Lower Extremity Assessment: RLE deficits/detail;LLE deficits/detail RLE Deficits / Details: R hip flex <3/5, 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 RLE: Unable to fully assess due to pain RLE Sensation: decreased light touch;decreased proprioception (pt unable to state whether or not he is donning sock without looking) LLE Deficits / Details: recent revascularization last month, partial weight bearing restrictions       Communication Communication Communication: No difficulties   Cognition Arousal/Alertness: Awake/alert Behavior During Therapy: WFL for tasks assessed/performed Overall Cognitive Status: Within Functional Limits for tasks assessed                                 General Comments: grossly oriented, pleasant and engaged in therapy   General Comments       Exercises Total Joint Exercises Ankle Circles/Pumps: AROM;Strengthening;Both;10 reps Quad Sets: Strengthening;Both;10 reps Gluteal Sets: Strengthening;Both;10 reps Hip ABduction/ADduction: AAROM;Right;10 reps Straight Leg Raises: AAROM;Right;10 reps Long Arc Quad: AROM;Strengthening;Both;10 Theatre manager in Standing: AROM;Strengthening;Both;5 reps;Standing Other Exercises Other Exercises: HEP education for BLE  APs, QS, GS, and LAQs x 10 each 5x/day Other Exercises: provided education re: OT role and plan of care, fall and safety precautions, self care, pain management   Shoulder Instructions      Home Living Family/patient expects to be discharged to:: Private residence Living Arrangements: Alone Available Help at Discharge: Family;Personal care attendant;Available PRN/intermittently (pt has PCA that assists with cooking/cleaning/groceries 2.5 hours per day) Type of Home: House Home Access: Stairs to enter [employer] of Steps: 3 Entrance Stairs-Rails: Left Home Layout: Able to live on main level with bedroom/bathroom     Bathroom Shower/Tub: Occupational psychologist: Standard     Home Equipment: Environmental consultant - 2 wheels;Cane - single point;Other (comment)   Additional Comments: PCA assists 2.5hrs/day with groceries, meal prep, laundry, etc...      Prior Functioning/Environment Level of Independence: Needs assistance  Gait / Transfers Assistance Needed: Ambulates in the home without AD unless he is tired in which he uses SPC or RW. Ambulates with SPC outside home. No other recent falls ADL's / Homemaking Assistance Needed: Someone comes to assist him 2.5 hrs a day with meal prep, groceries, laundery, etc. Ind with ADLs.  Daughter and son in law take him places when he  leaves the home.  Pt's daughter assists with medicaiton management.            OT Problem List: Decreased strength;Decreased range of motion;Impaired balance (sitting and/or standing);Decreased knowledge of use of DME or AE;Decreased knowledge of precautions;Pain;Impaired UE functional use      OT Treatment/Interventions: Self-care/ADL training;Therapeutic exercise;DME and/or AE instruction;Therapeutic activities;Balance training;Patient/family education    OT Goals(Current goals can be found in the care plan section) Acute Rehab OT Goals Patient Stated Goal: To walk better OT Goal Formulation: With  patient Time For Goal Achievement: 12/01/19 Potential to Achieve Goals: Good  OT Frequency: Min 2X/week   Barriers to D/C: Decreased caregiver support          Co-evaluation              AM-PAC OT "6 Clicks" Daily Activity     Outcome Measure Help from another person eating meals?: None Help from another person taking care of personal grooming?: None Help from another person toileting, which includes using toliet, bedpan, or urinal?: A Lot Help from another person bathing (including washing, rinsing, drying)?: A Lot Help from another person to put on and taking off regular upper body clothing?: None Help from another person to put on and taking off regular lower body clothing?: A Lot 6 Click Score: 18   End of Session    Activity Tolerance: Patient limited by pain Patient left: in bed;with call bell/phone within reach;with bed alarm set  OT Visit Diagnosis: Other abnormalities of gait and mobility (R26.89);Pain Pain - Right/Left: Right Pain - part of body: Hip                Time: 1107-1140 OT Time Calculation (min): 33 min Charges:  OT General Charges $OT Visit: 1 Visit OT Evaluation $OT Eval Moderate Complexity: 1 Mod OT Treatments $Self Care/Home Management : 23-37 mins  Myrtie Hawk Clancey Welton, OTR/L 11/17/19, 2:20 PM

## 2019-11-17 NOTE — Progress Notes (Signed)
Subjective:  POD #1 s/p intramedullary fixation for right intertrochanteric hip fracture.   Patient reports right hip pain as mild to moderate.  Patient states he was able to get out of bed today with physical therapy and sat in the chair for approximately an hour.  Objective:   VITALS:   Vitals:   11/17/19 0454 11/17/19 0747 11/17/19 1159 11/17/19 1505  BP: (!) 93/54 (!) 107/55 (!) 107/54 (!) 93/46  Pulse: 68 74 81 81  Resp: 20 17 17 16   Temp: 97.7 F (36.5 C) 97.6 F (36.4 C) 98.1 F (36.7 C) 98.2 F (36.8 C)  TempSrc: Oral Oral Oral Oral  SpO2: 100% 99% 94% 97%  Weight:        PHYSICAL EXAM: Right lower extremity: Patient has advanced peripheral neuropathy in both lower extremities.  Patient not have sensation light touch below his knee on the right lower extremity.  He has had a previous transmetatarsal amputation. Dorsiflexion/Plantar flexion of the ankle intact Dressing: moderate drainage No cellulitis present Compartment soft  LABS  Results for orders placed or performed during the hospital encounter of 11/14/19 (from the past 24 hour(s))  Glucose, capillary     Status: Abnormal   Collection Time: 11/16/19  9:22 PM  Result Value Ref Range   Glucose-Capillary 176 (H) 70 - 99 mg/dL  Glucose, capillary     Status: Abnormal   Collection Time: 11/17/19 12:31 AM  Result Value Ref Range   Glucose-Capillary 144 (H) 70 - 99 mg/dL  Glucose, capillary     Status: Abnormal   Collection Time: 11/17/19  5:05 AM  Result Value Ref Range   Glucose-Capillary 214 (H) 70 - 99 mg/dL  CBC     Status: Abnormal   Collection Time: 11/17/19  6:01 AM  Result Value Ref Range   WBC 7.3 4.0 - 10.5 K/uL   RBC 2.89 (L) 4.22 - 5.81 MIL/uL   Hemoglobin 8.4 (L) 13.0 - 17.0 g/dL   HCT 26.8 (L) 39 - 52 %   MCV 92.7 80.0 - 100.0 fL   MCH 29.1 26.0 - 34.0 pg   MCHC 31.3 30.0 - 36.0 g/dL   RDW 14.5 11.5 - 15.5 %   Platelets 103 (L) 150 - 400 K/uL   nRBC 0.0 0.0 - 0.2 %  Basic metabolic  panel     Status: Abnormal   Collection Time: 11/17/19  6:01 AM  Result Value Ref Range   Sodium 133 (L) 135 - 145 mmol/L   Potassium 4.2 3.5 - 5.1 mmol/L   Chloride 100 98 - 111 mmol/L   CO2 24 22 - 32 mmol/L   Glucose, Bld 215 (H) 70 - 99 mg/dL   BUN 35 (H) 8 - 23 mg/dL   Creatinine, Ser 1.55 (H) 0.61 - 1.24 mg/dL   Calcium 8.6 (L) 8.9 - 10.3 mg/dL   GFR calc non Af Amer 46 (L) >60 mL/min   GFR calc Af Amer 53 (L) >60 mL/min   Anion gap 9 5 - 15  Glucose, capillary     Status: Abnormal   Collection Time: 11/17/19  7:48 AM  Result Value Ref Range   Glucose-Capillary 181 (H) 70 - 99 mg/dL  Glucose, capillary     Status: Abnormal   Collection Time: 11/17/19 11:59 AM  Result Value Ref Range   Glucose-Capillary 269 (H) 70 - 99 mg/dL   Comment 1 Notify RN   Glucose, capillary     Status: Abnormal   Collection Time: 11/17/19  4:05 PM  Result Value Ref Range   Glucose-Capillary 200 (H) 70 - 99 mg/dL   Comment 1 Notify RN     DG HIP UNILAT WITH PELVIS 1V RIGHT  Result Date: 11/16/2019 CLINICAL DATA:  Postop EXAM: DG HIP (WITH OR WITHOUT PELVIS) 1V RIGHT COMPARISON:  11/15/2019 FINDINGS: Interval intramedullary rod and distal screw fixation of the proximal right femur for intertrochanteric fracture. Anatomic alignment. Intact hardware. Gas in the soft tissues of the thigh and hip consistent with recent surgery. Pubic symphysis and rami are intact. Extensive vascular calcification. IMPRESSION: Interval intramedullary rod and screw fixation of proximal right femur for intertrochanteric fracture. Electronically Signed   By: Donavan Foil M.D.   On: 11/16/2019 15:45   DG HIP OPERATIVE UNILAT W OR W/O PELVIS RIGHT  Result Date: 11/16/2019 CLINICAL DATA:  Right hip surgery EXAM: OPERATIVE right HIP (WITH PELVIS IF PERFORMED) 21 VIEWS TECHNIQUE: Fluoroscopic spot image(s) were submitted for interpretation post-operatively. COMPARISON:  11/15/2019 FINDINGS: Twenty-one low resolution  intraoperative spot views of the right hip. Total fluoroscopy time was 1 minutes 27 seconds. The initial images demonstrate normal alignment of right femur, intertrochanteric fracture difficult to visualize likely due to resolution. Subsequent images were obtained during operative course of intramedullary rod and distal screw fixation of the right femoral fracture. IMPRESSION: Intraoperative fluoroscopic assistance provided during surgical fixation of right femoral fracture. Electronically Signed   By: Donavan Foil M.D.   On: 11/16/2019 15:47    Assessment/Plan: 1 Day Post-Op   Principal Problem:   Intertrochanteric fracture of right femur, closed, initial encounter ([place]) Active Problems:   Atrial fibrillation ([place])   CAD (coronary artery disease)   DM (diabetes mellitus), type 2, uncontrolled with complications ([facility])   Chronic diastolic heart failure ([facility])   Chronic anticoagulation   Essential hypertension   PVD (peripheral vascular disease) ([place])   Accidental fall   Preoperative clearance   Closed right hip fracture, initial encounter [facility])  Patient making expected progress postop.  Patient restarted on Eliquis.  Patient is a registered sex offender and will not be a candidate to go to skilled nursing facility.  Attending physical therapy.  Patient may weight-bear as tolerated on right lower extremity with a walker for assistance with ambulation.   Thornton Park , MD 11/17/2019, 6:07 PM

## 2019-11-17 NOTE — Plan of Care (Signed)
  Problem: Fluid Volume: Goal: Ability to maintain a balanced intake and output will improve Outcome: Progressing   Problem: Health Behavior/Discharge Planning: Goal: Ability to identify and utilize available resources and services will improve Outcome: Progressing Goal: Ability to manage health-related needs will improve Outcome: Progressing

## 2019-11-17 NOTE — Progress Notes (Signed)
PROGRESS NOTE    Jason Montes  WLN:[id number] DOB: September 07, 1950 DOA: 11/14/2019 PCP: [facility]   Assessment & Plan:   Principal Problem:   Intertrochanteric fracture of right femur, closed, initial encounter ([place]) Active Problems:   Atrial fibrillation ([place])   CAD (coronary artery disease)   DM (diabetes mellitus), type 2, uncontrolled with complications ([place])   Chronic diastolic heart failure (HCC)   Chronic anticoagulation   Essential hypertension   PVD (peripheral vascular disease) ([place])   Accidental fall   Preoperative clearance   Closed right hip fracture, initial encounter ([place])   Intertrochanteric fracture of right femur: secondary to accidental fall. With severe peripheral vascular disease s/p recent revascularization. S/p intramedullary fixation of right intertrochanteric hip fracture 11/16/19. PT/OT when ok with ortho surg  PAF: continue on carvedilol & eliquis  CAD: no chest pain. Continue on carvedilol, lisinopril  DM2: poorly controlled. HbA1c 13.8. Continue on lantus, SSI w/ accuchecks   Chronic systolic heart failure: appears euvolemic. Echo shows EF 74-08%, normal diastolic function. Continue carvedilol, torsemide,  lisinopril.   CKDII: baseline Cr is unknown. Cr is labile. Will continue to monitor   Likely ACD: w/ a component of post op anemia. No need for a transfusion at this time. Will continue to monitor   Essential hypertension: continue lisinopril, carvedilol   PVD: recently underwent revascularization left leg on 10/10/2019 by Dr. Lucky Cowboy due to nonhealing ulcer, still present. Hx of right transmetatarsal amputation  Diabetic foot ulcer left foot: recommends outpatient f/u as per podiatry. Continue w/ wound care   Thrombocytopenia: etiology unclear. Will continue to monitor    DVT prophylaxis: heparin  Code Status: full  Family Communication:  Disposition Plan: depends on PT/OT recs   Consultants:   Ortho surg   Podiatry    Procedures:  S/p intramedullary fixation of right intertrochanteric hip fracture 11/16/19     Antimicrobials:    Subjective: Pt c/o weakness   Objective: Vitals:   11/17/19 0029 11/17/19 0454 11/17/19 0747 11/17/19 1159  BP: (!) 96/57 (!) 93/54 (!) 107/55 (!) 107/54  Pulse: 67 68 74 81  Resp: 20 20 17 17   Temp: 97.9 F (36.6 C) 97.7 F (36.5 C) 97.6 F (36.4 C) 98.1 F (36.7 C)  TempSrc: Oral Oral Oral Oral  SpO2: 100% 100% 99% 94%  Weight:        Intake/Output Summary (Last 24 hours) at 11/17/2019 1305 Last data filed at 11/17/2019 1201 Gross per 24 hour  Intake 598.83 ml  Output 1830 ml  Net -1231.17 ml   Filed Weights   11/15/19 0100  Weight: 81.6 kg    Examination:  General exam: Appears calm and comfortable  Respiratory system: Clear to auscultation. No wheezes  Cardiovascular system: S1/S2+. No  rubs, gallops or clicks.  Gastrointestinal system: Abdomen is nondistended, soft and nontender. Hypoactive bowel sounds heard. Central nervous system: Alert and oriented. No focal neurological deficits Psychiatry: Judgement and insight appear normal. Flat mood and affect     Data Reviewed: I have personally reviewed following labs and imaging studies  CBC: Recent Labs  Lab 11/15/19 0036 11/16/19 0419 11/17/19 0601  WBC 6.3 6.7 7.3  NEUTROABS 4.4  --   --   HGB 10.5* 9.6* 8.4*  HCT 34.0* 30.4* 26.8*  MCV 91.4 91.8 92.7  PLT PLATELET CLUMPS NOTED ON SMEAR, UNABLE TO ESTIMATE 140* 144*   Basic Metabolic Panel: Recent Labs  Lab 11/15/19 0036 11/16/19 0419 11/17/19 0601  NA 136 137 133*  K 4.1 3.8 4.2  CL 103 104 100  CO2 21* 25 24  GLUCOSE 198* 132* 215*  BUN 40* 32* 35*  CREATININE 1.57* 1.68* 1.55*  CALCIUM 8.9 8.8* 8.6*   GFR: Estimated Creatinine Clearance: 46.9 mL/min (A) (by C-G formula based on SCr of 1.55 mg/dL (H)). Liver Function Tests: No results for input(s): AST, ALT, ALKPHOS, BILITOT, PROT, ALBUMIN in the last 168 hours. No  results for input(s): LIPASE, AMYLASE in the last 168 hours. No results for input(s): AMMONIA in the last 168 hours. Coagulation Profile: Recent Labs  Lab 11/15/19 0036 11/16/19 0419  INR 1.6* 1.5*   Cardiac Enzymes: No results for input(s): CKTOTAL, CKMB, CKMBINDEX, TROPONINI in the last 168 hours. BNP (last 3 results) No results for input(s): PROBNP in the last 8760 hours. HbA1C: Recent Labs    11/15/19 0049  HGBA1C 11.4*   CBG: Recent Labs  Lab 11/16/19 2122 11/17/19 0031 11/17/19 0505 11/17/19 0748 11/17/19 1159  GLUCAP 176* 144* 214* 181* 269*   Lipid Profile: No results for input(s): CHOL, HDL, LDLCALC, TRIG, CHOLHDL, LDLDIRECT in the last 72 hours. Thyroid Function Tests: No results for input(s): TSH, T4TOTAL, FREET4, T3FREE, THYROIDAB in the last 72 hours. Anemia Panel: No results for input(s): VITAMINB12, FOLATE, FERRITIN, TIBC, IRON, RETICCTPCT in the last 72 hours. Sepsis Labs: No results for input(s): PROCALCITON, LATICACIDVEN in the last 168 hours.  Recent Results (from the past 240 hour(s))  SARS Coronavirus 2 by RT PCR (hospital order, performed in [facility] hospital lab) Nasopharyngeal Nasopharyngeal Swab     Status: None   Collection Time: 11/15/19 12:36 AM   Specimen: Nasopharyngeal Swab  Result Value Ref Range Status   SARS Coronavirus 2 NEGATIVE NEGATIVE Final    Comment: (NOTE) SARS-CoV-2 target nucleic acids are NOT DETECTED.  The SARS-CoV-2 RNA is generally detectable in upper and lower respiratory specimens during the acute phase of infection. The lowest concentration of SARS-CoV-2 viral copies this assay can detect is 250 copies / mL. A negative result does not preclude SARS-CoV-2 infection and should not be used as the sole basis for treatment or other patient management decisions.  A negative result may occur with improper specimen collection / handling, submission of specimen other than nasopharyngeal swab, presence of viral  mutation(s) within the areas targeted by this assay, and inadequate number of viral copies (<250 copies / mL). A negative result must be combined with clinical observations, patient history, and epidemiological information.  Fact Sheet for Patients:   StrictlyIdeas.no  Fact Sheet for Healthcare Providers: BankingDealers.co.za  This test is not yet approved or  cleared by the [place] FDA and has been authorized for detection and/or diagnosis of SARS-CoV-2 by FDA under an Emergency Use Authorization (EUA).  This EUA will remain in effect (meaning this test can be used) for the duration of the COVID-19 declaration under Section 564(b)(1) of the Act, 21 U.S.C. section 360bbb-3(b)(1), unless the authorization is terminated or revoked sooner.  Performed at [facility], [address] [place], [place] [address]   Surgical pcr screen     Status: Abnormal   Collection Time: 11/15/19  4:05 AM   Specimen: Nasal Mucosa; Nasal Swab  Result Value Ref Range Status   MRSA, PCR POSITIVE (A) NEGATIVE Final    Comment: RESULT CALLED TO, READ BACK BY AND VERIFIED WITH: Jamse Belfast RN [phone] 11/15/19 HNM    Staphylococcus aureus POSITIVE (A) NEGATIVE Final    Comment: (NOTE) The Xpert SA Assay (FDA  approved for NASAL specimens in patients 20 years of age and older), is one component of a comprehensive surveillance program. It is not intended to diagnose infection nor to guide or monitor treatment. Performed at [facility], [address] [place], [place] [address]          Radiology Studies: DG HIP UNILAT WITH PELVIS 1V RIGHT  Result Date: 11/16/2019 CLINICAL DATA:  Postop EXAM: DG HIP (WITH OR WITHOUT PELVIS) 1V RIGHT COMPARISON:  11/15/2019 FINDINGS: Interval intramedullary rod and distal screw fixation of the proximal right femur for intertrochanteric fracture. Anatomic alignment. Intact hardware. Gas in the  soft tissues of the thigh and hip consistent with recent surgery. Pubic symphysis and rami are intact. Extensive vascular calcification. IMPRESSION: Interval intramedullary rod and screw fixation of proximal right femur for intertrochanteric fracture. Electronically Signed   By: Donavan Foil M.D.   On: 11/16/2019 15:45   DG HIP OPERATIVE UNILAT W OR W/O PELVIS RIGHT  Result Date: 11/16/2019 CLINICAL DATA:  Right hip surgery EXAM: OPERATIVE right HIP (WITH PELVIS IF PERFORMED) 21 VIEWS TECHNIQUE: Fluoroscopic spot image(s) were submitted for interpretation post-operatively. COMPARISON:  11/15/2019 FINDINGS: Twenty-one low resolution intraoperative spot views of the right hip. Total fluoroscopy time was 1 minutes 27 seconds. The initial images demonstrate normal alignment of right femur, intertrochanteric fracture difficult to visualize likely due to resolution. Subsequent images were obtained during operative course of intramedullary rod and distal screw fixation of the right femoral fracture. IMPRESSION: Intraoperative fluoroscopic assistance provided during surgical fixation of right femoral fracture. Electronically Signed   By: Donavan Foil M.D.   On: 11/16/2019 15:47        Scheduled Meds: . acetaminophen  500 mg Oral Q6H  . apixaban  5 mg Oral BID  . aspirin EC  81 mg Oral Daily  . atorvastatin  40 mg Oral QHS  . carvedilol  3.125 mg Oral BID WC  . Chlorhexidine Gluconate Cloth  6 each Topical Q0600  . docusate sodium  100 mg Oral BID  . famotidine  20 mg Oral Daily  . gabapentin  300 mg Oral BID  . insulin aspart  0-15 Units Subcutaneous Q4H  . ketorolac  7.5 mg Intravenous Q6H  . lisinopril  2.5 mg Oral Daily  . mupirocin ointment  1 application Nasal BID  . pneumococcal 23 valent vaccine  0.5 mL Intramuscular Tomorrow-1000  . Ensure Max Protein  11 oz Oral Daily  . senna  1 tablet Oral BID  . tamsulosin  0.4 mg Oral Daily  . torsemide  20 mg Oral Daily  . traMADol  50 mg Oral  Q6H   Continuous Infusions:   LOS: 2 days    Time spent: 31 mins     Wyvonnia Dusky, MD [facility] Pager [phone]  If 7PM-7AM, please contact night-coverage [email or web address] 11/17/2019, 1:05 PM

## 2019-11-17 NOTE — Progress Notes (Signed)
Physical Therapy Treatment Patient Details Name: Jason Montes MRN: [id number] DOB: 14-Jun-1950 Today's Date: 11/17/2019    History of Present Illness Per MD notes: Pt is a 69 y.o. 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 Pt diagnosed with R Intertrochanteric hip fx and is s/p IM fixation.    PT Comments    Pt reports improved pain control this pm.  Discussed with pt importance of pain medication as scheduled to allow for improved mobility and participation in PT sessions.  Voiced understanding.  Participated in exercises as described below.  To EOB with rail and min a x 1.  Steady in sitting.  Stood from EOB with raised height and min a x 1.  On first attempt focused on standing posture/ balance and LE ex.  After seated rest, he  Is able to stand and take several short shuffling steps to recliner.    Discussed at length discharge plan.  Pt lives in boarding home with community bathroom.  First floor room but stairs to enter/exit home with 1 rail.  He stated bathroom door is too small to fit walker in to access bathroom and he would not have a way to empty the bucket without assist.  Discussed having a wheelchair for in home mobility in the home but again he voices concerns about the ability of wheelchair to fit in the doorways and limited space in room.  Staying with his daughter is not an option.  SNF is recommended but may not be possible per SWS notes.  A safe discharge plan may be challenging.  Will recommend commode and likely a wheelchair if discharged home along with HHPT.  Pt did say he recently had a bedside commode but gave it to a neighbor who needed it.     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 A cane, walker, crutch will not resolve the patient's issue with performing activities of daily living. A lightweight wheelchair and cushion is required/recommended and will allow patient to safely perform daily activities.   Patient can safely propel the wheelchair in the home or has a caregiver who can provide assistance.    Follow Up Recommendations  SNF;Other (comment)     Equipment Recommendations  Wheelchair (measurements PT);Wheelchair cushion (measurements PT);3in1 (PT)    Recommendations for Other Services       Precautions / Restrictions Precautions Precautions: Fall Restrictions Weight Bearing Restrictions: Yes LUE Weight Bearing: Weight bearing as tolerated RLE Weight Bearing: Weight bearing as tolerated LLE Weight Bearing: Weight bearing as tolerated Other Position/Activity Restrictions: Pt with L diabetic foot ulcer, WBAT per Dr. Luana Shu vebal order 11/17/19 with a shoe donned, does not have to be a post-op shoe    Mobility  Bed Mobility Overal bed mobility: Needs Assistance Bed Mobility: Supine to Sit     Supine to sit: Mod assist Sit to supine: Mod assist   General bed mobility comments: Unable to assess  Transfers Overall transfer level: Needs assistance Equipment used: Rolling walker (2 wheeled) Transfers: Sit to/from Stand Sit to Stand: Min assist         General transfer comment: increased  time to complete  Ambulation/Gait Ambulation/Gait assistance: Min assist Gait Distance (Feet): 3 Feet Assistive device: Rolling walker (2 wheeled) Gait Pattern/deviations: Step-to pattern;Antalgic;Decreased stance time - right;Decreased step length - left Gait velocity: decreased   General Gait Details: heavy lean on RW.  Stood x 2 and on second attempt, able to transfer to recliner with very  short shuffling steps and close min a with B shoes donned.   Stairs             Wheelchair Mobility    Modified Rankin (Stroke Patients Only)       Balance Overall balance assessment: Needs assistance Sitting-balance support: Feet supported Sitting balance-Leahy Scale: Good Sitting balance - Comments: unable to assess   Standing balance support: Bilateral upper extremity supported Standing balance-Leahy Scale: Poor Standing balance comment: very heavy lean on RW with hand on assist at all times.                            Cognition Arousal/Alertness: Awake/alert Behavior During Therapy: WFL for tasks assessed/performed Overall Cognitive Status: Within Functional Limits for tasks assessed                                 General Comments: grossly oriented, pleasant and engaged in therapy      Exercises Total Joint Exercises Ankle Circles/Pumps: AROM;Strengthening;Both;10 reps Quad Sets: Strengthening;Both;10 reps Gluteal Sets: Strengthening;Both;10 reps Hip ABduction/ADduction: AAROM;Right;10 reps Straight Leg Raises: AAROM;Right;10 reps;Supine;Standing Long Arc Quad: AROM;Strengthening;Both;10 Theatre manager in Standing: AROM;Strengthening;Both;5 reps;Standing Other Exercises Other Exercises: HEP education for BLE APs, QS, GS, and LAQs x 10 each 5x/day Other Exercises: provided education re: OT role and plan of care, fall and safety precautions, self care, pain management    General Comments        Pertinent Vitals/Pain Pain Assessment: Faces Pain Score: 6  Faces Pain Scale: Hurts even more Pain Location: more comfortable this session but continues limited by pain and weakness Pain Descriptors / Indicators: Aching;Sore Pain Intervention(s): Limited activity within patient's tolerance;Monitored during session;Repositioned    Home Living Family/patient expects to be discharged to:: Private residence Living Arrangements:  Alone Available Help at Discharge: Family;Personal care attendant;Available PRN/intermittently (pt has PCA that assists with cooking/cleaning/groceries 2.5 hours per day) Type of Home: House Home Access: Stairs to enter Entrance Stairs-Rails: Left Home Layout: Able to live on main level with bedroom/bathroom Home Equipment: Walker - 2 wheels;Cane - single point;Other (comment) Additional Comments: PCA assists 2.5hrs/day with groceries, meal prep, laundry, etc...    Prior Function Level of Independence: Needs assistance  Gait / Transfers Assistance Needed: Ambulates in the home without AD unless he is tired in which he uses SPC or RW. Ambulates with SPC outside home. No other recent falls ADL's / Homemaking Assistance Needed: Someone comes to assist him 2.5 hrs a day with meal prep, groceries, laundery, etc. Ind with ADLs.  Daughter and son in law take him places when he leaves the home.  Pt's daughter assists with medicaiton management.     PT Goals (current goals can now be found in the care plan section) Acute Rehab PT Goals Patient Stated Goal: To walk better PT Goal Formulation: With patient Time For Goal Achievement: 11/30/19 Potential to Achieve Goals: Good Progress towards PT goals: Progressing toward goals    Frequency    BID      PT Plan Current plan remains  appropriate;Other (comment)    Co-evaluation              AM-PAC PT "6 Clicks" Mobility   Outcome Measure  Help needed turning from your back to your side while in a flat bed without using bedrails?: A Lot Help needed moving from lying on your back to sitting on the side of a flat bed without using bedrails?: A Lot Help needed moving to and from a bed to a chair (including a wheelchair)?: A Little Help needed standing up from a chair using your arms (e.g., wheelchair or bedside chair)?: A Little Help needed to walk in hospital room?: A Lot Help needed climbing 3-5 steps with a railing? : Total 6 Click  Score: 13    End of Session Equipment Utilized During Treatment: Gait belt Activity Tolerance: Patient tolerated treatment well;Patient limited by pain Patient left: in chair;with call bell/phone within reach;with chair alarm set Nurse Communication: Mobility status PT Visit Diagnosis: Unsteadiness on feet (R26.81);Other abnormalities of gait and mobility (R26.89);Muscle weakness (generalized) (M62.81);Pain Pain - Right/Left: Right Pain - part of body: Hip     Time: 2182-8833 PT Time Calculation (min) (ACUTE ONLY): 23 min  Charges:  $Gait Training: 8-22 mins $Therapeutic Exercise: 8-22 mins                    Chesley Noon, PTA 11/17/19, 3:36 PM

## 2019-11-17 NOTE — TOC Progression Note (Signed)
Transition of Care [facility]) - Progression Note    Patient Details  Name: Jason Montes MRN: [id number] Date of Birth: 1951-05-06  Transition of Care [facility]) CM/SW Jane, RN Phone Number: 11/17/2019, 2:45 PM  Clinical Narrative:     Met with the patient to discuss DC plan and needs He is currently on parole and house arrest He is on the sex offender's registry and unable to go to SNF as a result He lives in a boarding house with other men present He has a RW at home and the last admission was provided with a BSC, He denies the need for more DME [facility] and has a personal care Aide 2.5 hours a day to hekp with meals and other needs PT is working with the patient to get him strong enough to go back home to the boarding house       Expected Discharge Plan and Services                                                 Social Determinants of Health (SDOH) Interventions    Readmission Risk Interventions No flowsheet data found.

## 2019-11-17 NOTE — Evaluation (Signed)
Physical Therapy Evaluation Patient Details Name: Jason Montes MRN: [id number] DOB: 1951-03-19 Today's Date: 11/17/2019   History of Present Illness  Per MD notes: Pt is a 69 y.o. 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 Pt diagnosed with R Intertrochanteric hip fx and is s/p IM fixation.    Clinical Impression  Pt pleasant and motivated to participate during the session.  Pt put forth good effort but was limited by weakness and pain with all functional tasks.  Pt required physical assistance with bed mobility and transfers and was only able to take several very small steps around the EOB before needing to return to sitting.  Pt reported no adverse symptoms during the session other than pain with SpO2 and HR WNL throughout on room air.  Pt will benefit from PT services in a SNF setting upon discharge to safely address deficits listed in patient problem list for decreased caregiver assistance and eventual return to PLOF.      Follow Up Recommendations SNF    Equipment Recommendations  None recommended by PT    Recommendations for Other Services       Precautions / Restrictions Precautions Precautions: Fall Restrictions Weight Bearing Restrictions: Yes LLE Weight Bearing: Weight bearing as tolerated RLE Weight Bearing: Weight bearing as tolerated Other Position/Activity Restrictions: Pt with L diabetic foot ulcer, WBAT per Dr. Luana Shu vebal order 11/17/19 with a shoe donned, does not have to be a post-op shoe      Mobility  Bed Mobility Overal bed mobility: Needs Assistance Bed Mobility: Supine to Sit;Sit to Supine     Supine to sit: Mod  assist Sit to supine: Mod assist   General bed mobility comments: Mod A for RLE and trunk control  Transfers Overall transfer level: Needs assistance Equipment used: Rolling walker (2 wheeled) Transfers: Sit to/from Stand Sit to Stand: Min assist         General transfer comment: Mod verbal cues for sequencing  Ambulation/Gait Ambulation/Gait assistance: Min guard Gait Distance (Feet): 4 Feet Assistive device: Rolling walker (2 wheeled) Gait Pattern/deviations: Step-to pattern;Antalgic;Decreased stance time - right;Decreased step length - left Gait velocity: decreased   General Gait Details: Heavy lean on the RW for suppport during RLE SLS with pt only able to take several steps near the EOB before needing to return to sitting secondary to R hip pain.  Stairs            Wheelchair Mobility    Modified Rankin (Stroke Patients Only)       Balance Overall balance assessment: Needs assistance   Sitting balance-Leahy Scale: Good     Standing balance support: Bilateral upper extremity supported Standing balance-Leahy Scale: Fair                               Pertinent Vitals/Pain Pain Assessment: 0-10 Pain Score: 6  Pain Location: RLE Pain Descriptors / Indicators: Aching;Sore Pain Intervention(s): Premedicated before session;Monitored during session;Patient requesting pain meds-RN notified    Home Living Family/patient expects to be discharged to:: Private residence Living Arrangements: Alone Available Help at Discharge: Family;Personal care attendant;Available PRN/intermittently Type of Home: House Home Access: Stairs to enter Entrance Stairs-Rails:  Left Entrance Stairs-Number of Steps: 3   Home Equipment: Walker - 2 wheels;Cane - single point;Other (comment) (has a bed rail) Additional Comments: PCA assists 2.5hrs/day with groceries, meal prep, laundry, etc...    Prior Function Level of Independence: Needs assistance   Gait / Transfers  Assistance Needed: Ambulates in the home without AD unless he is tired. Ambulates with SPC outside home. No other recent falls  ADL's / Homemaking Assistance Needed: Someone comes to assist him 2.5 hrs a day with meal prep, groceries, laundery, etc. Ind with ADLs.  Daughter and son in law take him places when he leaves the home.        Hand Dominance        Extremity/Trunk Assessment   Upper Extremity Assessment Upper Extremity Assessment: Defer to OT evaluation    Lower Extremity Assessment Lower Extremity Assessment: Generalized weakness;RLE deficits/detail RLE Deficits / Details: R hip flex <3/5 RLE: Unable to fully assess due to pain       Communication   Communication: No difficulties  Cognition Arousal/Alertness: Awake/alert Behavior During Therapy: WFL for tasks assessed/performed Overall Cognitive Status: Within Functional Limits for tasks assessed                                        General Comments      Exercises Total Joint Exercises Ankle Circles/Pumps: AROM;Strengthening;Both;10 reps Quad Sets: Strengthening;Both;10 reps Gluteal Sets: Strengthening;Both;10 reps Hip ABduction/ADduction: AAROM;Right;10 reps Straight Leg Raises: AAROM;Right;10 reps Long Arc Quad: AROM;Strengthening;Both;10 Theatre manager in Standing: AROM;Strengthening;Both;5 reps;Standing Other Exercises Other Exercises: HEP education for BLE APs, QS, GS, and LAQs x 10 each 5x/day   Assessment/Plan    PT Assessment Patient needs continued PT services  PT Problem List Decreased strength;Decreased activity tolerance;Decreased balance;Decreased mobility;Decreased knowledge of use of DME;Pain       PT Treatment Interventions DME instruction;Gait training;Stair training;Functional mobility training;Therapeutic activities;Therapeutic exercise;Balance training;Patient/family education    PT Goals (Current goals can be found in the Care Plan section)  Acute Rehab PT  Goals Patient Stated Goal: To walk better PT Goal Formulation: With patient Time For Goal Achievement: 11/30/19 Potential to Achieve Goals: Good    Frequency BID   Barriers to discharge Inaccessible home environment;Decreased caregiver support      Co-evaluation               AM-PAC PT "6 Clicks" Mobility  Outcome Measure Help needed turning from your back to your side while in a flat bed without using bedrails?: A Lot Help needed moving from lying on your back to sitting on the side of a flat bed without using bedrails?: A Lot Help needed moving to and from a bed to a chair (including a wheelchair)?: A Little Help needed standing up from a chair using your arms (e.g., wheelchair or bedside chair)?: A Little Help needed to walk in hospital room?: A Lot Help needed climbing 3-5 steps with a railing? : Total 6 Click Score: 13    End of Session Equipment Utilized During Treatment: Gait belt Activity Tolerance: Patient limited by pain Patient left: in bed;with call bell/phone within reach;with bed alarm set;with SCD's reapplied Nurse Communication: Mobility status;Patient requests pain meds PT Visit Diagnosis: Unsteadiness on feet (R26.81);Other abnormalities of gait and mobility (R26.89);Muscle weakness (generalized) (M62.81);Pain Pain - Right/Left: Right Pain - part of body: Hip    Time: 4097-3532 PT Time Calculation (min) (ACUTE ONLY):  46 min   Charges:   PT Evaluation $PT Eval Moderate Complexity: 1 Mod PT Treatments $Therapeutic Exercise: 8-22 mins        D. Royetta Asal PT, DPT 11/17/19, 1:57 PM

## 2019-11-17 NOTE — Plan of Care (Signed)
  Problem: Fluid Volume: Goal: Ability to maintain a balanced intake and output will improve Outcome: Progressing   Problem: Health Behavior/Discharge Planning: Goal: Ability to identify and utilize available resources and services will improve Outcome: Progressing Goal: Ability to manage health-related needs will improve Outcome: Progressing   Problem: Metabolic: Goal: Ability to maintain appropriate glucose levels will improve Outcome: Progressing   Problem: Nutritional: Goal: Maintenance of adequate nutrition will improve Outcome: Progressing   Problem: Skin Integrity: Goal: Risk for impaired skin integrity will decrease Outcome: Progressing   Problem: Health Behavior/Discharge Planning: Goal: Ability to manage health-related needs will improve Outcome: Progressing   Problem: Clinical Measurements: Goal: Ability to maintain clinical measurements within normal limits will improve Outcome: Progressing Goal: Will remain free from infection Outcome: Progressing Goal: Respiratory complications will improve Outcome: Progressing   Problem: Activity: Goal: Risk for activity intolerance will decrease Outcome: Progressing   Problem: Nutrition: Goal: Adequate nutrition will be maintained Outcome: Progressing   Problem: Coping: Goal: Level of anxiety will decrease Outcome: Progressing   Problem: Elimination: Goal: Will not experience complications related to bowel motility Outcome: Progressing Goal: Will not experience complications related to urinary retention Outcome: Progressing   Problem: Pain Managment: Goal: General experience of comfort will improve Outcome: Progressing   Problem: Safety: Goal: Ability to remain free from injury will improve Outcome: Progressing   Problem: Skin Integrity: Goal: Risk for impaired skin integrity will decrease Outcome: Progressing

## 2019-11-18 DIAGNOSIS — K5900 Constipation, unspecified: Secondary | ICD-10-CM

## 2019-11-18 LAB — BASIC METABOLIC PANEL
Anion gap: 4 — ABNORMAL LOW (ref 5–15)
BUN: 39 mg/dL — ABNORMAL HIGH (ref 8–23)
CO2: 28 mmol/L (ref 22–32)
Calcium: 8.6 mg/dL — ABNORMAL LOW (ref 8.9–10.3)
Chloride: 103 mmol/L (ref 98–111)
Creatinine, Ser: 1.63 mg/dL — ABNORMAL HIGH (ref 0.61–1.24)
GFR calc Af Amer: 50 mL/min — ABNORMAL LOW (ref >60)
GFR calc non Af Amer: 43 mL/min — ABNORMAL LOW (ref >60)
Glucose, Bld: 175 mg/dL — ABNORMAL HIGH (ref 70–99)
Potassium: 4.4 mmol/L (ref 3.5–5.1)
Sodium: 135 mmol/L (ref 135–145)

## 2019-11-18 LAB — GLUCOSE, CAPILLARY
Glucose-Capillary: 186 mg/dL — ABNORMAL HIGH (ref 70–99)
Glucose-Capillary: 216 mg/dL — ABNORMAL HIGH (ref 70–99)
Glucose-Capillary: 175 mg/dL — ABNORMAL HIGH (ref 70–99)
Glucose-Capillary: 297 mg/dL — ABNORMAL HIGH (ref 70–99)

## 2019-11-18 LAB — CBC
HCT: 25.4 % — ABNORMAL LOW (ref 39.0–52.0)
Hemoglobin: 8.3 g/dL — ABNORMAL LOW (ref 13.0–17.0)
MCH: 28.8 pg (ref 26.0–34.0)
MCHC: 32.7 g/dL (ref 30.0–36.0)
MCV: 88.2 fL (ref 80.0–100.0)
Platelets: 128 10*3/uL — ABNORMAL LOW (ref 150–400)
RBC: 2.88 MIL/uL — ABNORMAL LOW (ref 4.22–5.81)
RDW: 14.5 % (ref 11.5–15.5)
WBC: 6.5 10*3/uL (ref 4.0–10.5)
nRBC: 0 % (ref 0.0–0.2)

## 2019-11-18 MED ORDER — INSULIN GLARGINE 100 UNIT/ML ~~LOC~~ SOLN
5.0000 [IU] | Freq: Every day | SUBCUTANEOUS | Status: DC
  Administered 2019-11-19 – 2019-11-24 (×6): 5 [IU] via SUBCUTANEOUS
  Filled 2019-11-18 (×9): qty 0.05

## 2019-11-18 MED ORDER — INSULIN ASPART 100 UNIT/ML ~~LOC~~ SOLN
0.0000 [IU] | Freq: Every day | SUBCUTANEOUS | Status: DC
  Administered 2019-11-18: 3 [IU] via SUBCUTANEOUS
  Administered 2019-11-20: 4 [IU] via SUBCUTANEOUS
  Administered 2019-11-21: 2 [IU] via SUBCUTANEOUS
  Administered 2019-11-23: 0 [IU] via SUBCUTANEOUS
  Administered 2019-11-23: 2 [IU] via SUBCUTANEOUS
  Filled 2019-11-18 (×4): qty 1

## 2019-11-18 MED ORDER — INSULIN ASPART 100 UNIT/ML ~~LOC~~ SOLN
0.0000 [IU] | Freq: Three times a day (TID) | SUBCUTANEOUS | Status: DC
  Administered 2019-11-18: 3 [IU] via SUBCUTANEOUS
  Administered 2019-11-18: 5 [IU] via SUBCUTANEOUS
  Administered 2019-11-19: 3 [IU] via SUBCUTANEOUS
  Administered 2019-11-19: 8 [IU] via SUBCUTANEOUS
  Administered 2019-11-19 – 2019-11-21 (×5): 5 [IU] via SUBCUTANEOUS
  Administered 2019-11-21: 3 [IU] via SUBCUTANEOUS
  Administered 2019-11-21: 5 [IU] via SUBCUTANEOUS
  Administered 2019-11-22: 3 [IU] via SUBCUTANEOUS
  Administered 2019-11-22 – 2019-11-23 (×3): 8 [IU] via SUBCUTANEOUS
  Administered 2019-11-23: 3 [IU] via SUBCUTANEOUS
  Administered 2019-11-23: 2 [IU] via SUBCUTANEOUS
  Administered 2019-11-24: 8 [IU] via SUBCUTANEOUS
  Administered 2019-11-24: 3 [IU] via SUBCUTANEOUS
  Filled 2019-11-18 (×19): qty 1

## 2019-11-18 MED ORDER — POLYETHYLENE GLYCOL 3350 17 G PO PACK
17.0000 g | PACK | Freq: Two times a day (BID) | ORAL | Status: DC
  Administered 2019-11-18 – 2019-11-24 (×5): 17 g via ORAL
  Filled 2019-11-18 (×10): qty 1

## 2019-11-18 NOTE — Progress Notes (Signed)
Inpatient Diabetes Program Recommendations  AACE/ADA: New Consensus Statement on Inpatient Glycemic Control (2015)  Target Ranges:  Prepandial:   less than 140 mg/dL      Peak postprandial:   less than 180 mg/dL (1-2 hours)      Critically ill patients:  140 - 180 mg/dL   Lab Results  Component Value Date   GLUCAP 216 (H) 11/18/2019   HGBA1C 11.4 (H) 11/15/2019    Review of Glycemic Control Results for THEORDORE, CISNERO (MRN [id number]) as of 11/18/2019 13:27  Ref. Range 11/17/2019 23:44 11/18/2019 04:06 11/18/2019 12:07  Glucose-Capillary Latest Ref Range: 70 - 99 mg/dL 228 (H) 186 (H) 216 (H)   Diabetes history: DM 2 Outpatient Diabetes medications: Jardiance 12.5 mg daily, Metformin 500 mg bid, Ozempic 1.5 mg weekly Current orders for Inpatient glycemic control:  Novolog moderate tid with meals and HS Lantus 5 units daily (just added)  Inpatient Diabetes Program Recommendations:    It appears that A1C is improved from last admit in May, 2021.  If fasting continues>140 mg/dL, consider increasing Lantus to 10 units daily while in the hospital.    Thanks  Adah Perl, RN, BC-ADM Inpatient Diabetes Coordinator Pager [phone] (8a-5p)

## 2019-11-18 NOTE — Progress Notes (Signed)
Patient informed me that he would like to be DNR, and said that he has been for years, however he is showing up in the system as a FULL code. I let Sharion Settler, the covering provider know of this.

## 2019-11-18 NOTE — Progress Notes (Addendum)
Physical Therapy Treatment Patient Details Name: Jason Montes MRN: [id number] DOB: Sep 27, 1950 Today's Date: 11/18/2019    History of Present Illness Per MD notes: Pt is a 69 y.o. 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 Pt diagnosed with R Intertrochanteric hip fx and is s/p IM fixation.    PT Comments    Pt in recliner, pre-medicated 45 minutes prior to session for optimal pain control.  Stood from recliner with min a x 1 and increased time.  Once standing, he is able to progress gait 20' to commode but unable to make it to bathroom despite motivation to do so.  After voiding on commode, she is able to stand and walk to close side of bed but needs mod encouragement to complete distance to bed.  Mod a x 1 for both Upper and lower body to return to supine.  Pain remains primary barrier to progression of therapy.    Upon further discussion, pt does have a rail on his queen size bed.  Per discussion with SWS a hospital bed is not an option for pt given he lives in a boarding home. Encouraged him to call his friend to see if he can get his [facility] back as he will need it for discharge home.  A wheelchair would also be appropriate for discharge if SNF is not available.   Follow Up Recommendations  SNF;Other (comment)     Equipment Recommendations  Wheelchair, cushion, bedside commode.    Recommendations for Other Services       Precautions / Restrictions Precautions Precautions: Fall Restrictions Weight Bearing Restrictions: Yes LUE Weight Bearing: Weight bearing as tolerated RLE Weight Bearing: Weight bearing as tolerated LLE Weight Bearing: Weight  bearing as tolerated Other Position/Activity Restrictions: Pt with L diabetic foot ulcer, WBAT per Dr. Luana Shu vebal order 11/17/19 with a shoe donned, does not have to be a post-op shoe    Mobility  Bed Mobility Overal bed mobility: Needs Assistance Bed Mobility: Sit to Supine     Supine to sit: Min assist Sit to supine: Mod assist   General bed mobility comments: assist for upper and lowe body to return to supine  Transfers Overall transfer level: Needs assistance Equipment used: Rolling walker (2 wheeled) Transfers: Sit to/from Stand Sit to Stand: Min assist;Mod assist         General transfer comment: increased time to complete, some increased assist this am  Ambulation/Gait Ambulation/Gait assistance: Min assist Gait Distance (Feet): 20 Feet Assistive device: Rolling walker (2 wheeled) Gait Pattern/deviations: Step-to pattern;Antalgic;Decreased stance time - right;Decreased step length - left Gait velocity: decreased   General Gait Details: heavy lean on RW, 20' x 1, 5' x 1 limited by pain   Stairs             Wheelchair Mobility    Modified Rankin (Stroke Patients Only)       Balance Overall balance assessment: Needs assistance Sitting-balance support: Feet supported Sitting balance-Leahy Scale: Good     Standing balance support: Bilateral upper extremity supported Standing balance-Leahy Scale: Poor Standing balance comment: very heavy lean on RW with hand on assist at all times.  Cognition Arousal/Alertness: Awake/alert Behavior During Therapy: WFL for tasks assessed/performed Overall Cognitive Status: Within Functional Limits for tasks assessed                                 General Comments: grossly oriented, pleasant and engaged in therapy      Exercises Total Joint Exercises Ankle Circles/Pumps: AROM;Strengthening;Both;10 reps Quad Sets: Strengthening;Both;10 reps Gluteal Sets:  Strengthening;Both;10 reps Hip ABduction/ADduction: AAROM;Right;10 reps Straight Leg Raises: AAROM;Right;10 reps;Supine;Standing Long Arc Quad: AROM;Strengthening;Both;10 Theatre manager in Standing: AROM;Strengthening;Both;5 reps;Standing Other Exercises Other Exercises: provided education and visual demonstration of use of AE for LBD, pt demonstrated technique with good recall of education    General Comments        Pertinent Vitals/Pain Pain Assessment: Faces Pain Score: 6  Faces Pain Scale: Hurts whole lot Pain Location: R hip- Pt's ability to complete LBD with AE was limited by pain, did not rate Pain Descriptors / Indicators: Aching;Sore Pain Intervention(s): Premedicated before session;Repositioned;Monitored during session;Limited activity within patient's tolerance    Home Living                      Prior Function            PT Goals (current goals can now be found in the care plan section) Acute Rehab PT Goals Patient Stated Goal: To walk better Progress towards PT goals: Progressing toward goals    Frequency    BID      PT Plan Current plan remains appropriate;Other (comment)    Co-evaluation              AM-PAC PT "6 Clicks" Mobility   Outcome Measure  Help needed turning from your back to your side while in a flat bed without using bedrails?: A Lot Help needed moving from lying on your back to sitting on the side of a flat bed without using bedrails?: A Lot Help needed moving to and from a bed to a chair (including a wheelchair)?: A Little Help needed standing up from a chair using your arms (e.g., wheelchair or bedside chair)?: A Lot Help needed to walk in hospital room?: A Lot Help needed climbing 3-5 steps with a railing? : Total 6 Click Score: 12    End of Session Equipment Utilized During Treatment: Gait belt Activity Tolerance: Patient tolerated treatment well;Patient limited by pain Patient left: in bed;with call bell/phone  within reach;with bed alarm set Nurse Communication: Mobility status Pain - Right/Left: Right Pain - part of body: Hip     Time: 7616-0737 PT Time Calculation (min) (ACUTE ONLY): 27 min  Charges:  $Gait Training: 8-22 mins $Therapeutic Exercise: 8-22 mins $Therapeutic Activity: 8-22 mins                    Chesley Noon, PTA 11/18/19, 2:06 PM

## 2019-11-18 NOTE — Progress Notes (Signed)
Occupational Therapy Treatment Patient Details Name: Jason Montes MRN: [id number] DOB: May 28, 1951 Today's Date: 11/18/2019    History of present illness Per MD notes: Pt is a 69 y.o. 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 Pt diagnosed with R Intertrochanteric hip fx and is s/p IM fixation.   OT comments  Jason Montes is making progress towards his functional goals, but continues to present with pain, limited ROM, and impaired balance that impact his ability to safely and independently complete ADLs.  He endorses significant R hip pain with any movement.  OTR provided education and visual demonstration of use of AE (reacher and sock aid) for lower body dressing.  Pt demonstrated good recall of education.  He was able to doff/don B socks using reacher/sock aid.  He was also able to don L shoe with reacher, but required assist to don R shoe.  Pt will continue to benefit from skilled OT services in acute setting to address pain, strength, balance, and safety and independence in ADLs.  Pt will need continued OT services to address toilet transfers, bathing, dressing, and fall prevention prior to discharge.  Pt remains most appropriate for SNF after discharge, but will need continued therapy prior to returning home as it appears SNF may not be an option.   Follow Up Recommendations  SNF;Home health OT;Supervision/Assistance - 24 hour    Equipment Recommendations  3 in 1 bedside commode;Tub/shower seat;Other (comment) (sock aid)    Recommendations for Other Services      Precautions / Restrictions Precautions Precautions: Fall Restrictions Weight Bearing  Restrictions: Yes LUE Weight Bearing: Weight bearing as tolerated RLE Weight Bearing: Weight bearing as tolerated LLE Weight Bearing: Weight bearing as tolerated Other Position/Activity Restrictions: Pt with L diabetic foot ulcer, WBAT per Dr. Luana Montes vebal order 11/17/19 with a shoe donned, does not have to be a post-op shoe       Mobility Bed Mobility Overal bed mobility: Needs Assistance Bed Mobility: Supine to Sit     Supine to sit: Min assist     General bed mobility comments: improved mobility today but heavy use of rail and increased time.  Transfers Overall transfer level: Needs assistance Equipment used: Rolling walker (2 wheeled) Transfers: Sit to/from Stand Sit to Stand: Min assist;Mod assist         General transfer comment: increased time to complete, some increased assist this am    Balance Overall balance assessment: Needs assistance Sitting-balance support: Feet supported Sitting balance-Leahy Scale: Good     Standing balance support: Bilateral upper extremity supported Standing balance-Leahy Scale: Poor Standing balance comment: very heavy lean on RW with hand on assist at all times.                           ADL either performed or assessed with clinical judgement   ADL Overall ADL's : Needs assistance/impaired Eating/Feeding: Set up;Sitting   Grooming: Set up;Sitting   Upper Body Bathing: Set up;Sitting   Lower Body Bathing: Sitting/lateral leans;Minimal assistance   Upper Body Dressing : Set up;Sitting   Lower Body Dressing: With adaptive equipment;Sit to/from stand;Minimal assistance Lower Body Dressing Details (indicate cue type and reason): Pt able to  use reacher and sock aid to don/doff B socks.  Pt able to use reacher to don L shoe, but unable to don R shoe.  OTR provided assist to don R shoe and tie laces.  Suspect pt woult be able to don/doff pants with use of reacher and min A.   Toilet Transfer Details (indicate cue type  and reason): not assessed           General ADL Comments: Pt requires min assist when using AE for lower body dressing (needs assist to don R shoe).  Needs continued practice with toilet transfers, but able to complete stand pivot transfers with min A and RW per PT report.     Vision Patient Visual Report: No change from baseline     Perception     Praxis      Cognition Arousal/Alertness: Awake/alert Behavior During Therapy: WFL for tasks assessed/performed Overall Cognitive Status: Within Functional Limits for tasks assessed                                 General Comments: grossly oriented, pleasant and engaged in therapy        Exercises Total Joint Exercises Ankle Circles/Pumps: AROM;Strengthening;Both;10 reps Quad Sets: Strengthening;Both;10 reps Gluteal Sets: Strengthening;Both;10 reps Hip ABduction/ADduction: AAROM;Right;10 reps Straight Leg Raises: AAROM;Right;10 reps;Supine;Standing Long Arc Quad: AROM;Strengthening;Both;10 Theatre manager in Standing: AROM;Strengthening;Both;5 reps;Standing Other Exercises Other Exercises: provided education and visual demonstration of use of AE for LBD, pt demonstrated technique with good recall of education   Shoulder Instructions       General Comments      Pertinent Vitals/ Pain       Pain Assessment: Faces Pain Score: 6  Faces Pain Scale: Hurts even more Pain Location: R hip- Pt's ability to complete LBD with AE was limited by pain, did not rate Pain Descriptors / Indicators: Aching;Sore Pain Intervention(s): Limited activity within patient's tolerance;Monitored during session;Patient requesting pain meds-RN notified  Home Living                                          Prior Functioning/Environment              Frequency  Min 2X/week        Progress Toward Goals  OT Goals(current goals can now be found in the care plan section)  Progress towards OT goals: Progressing  toward goals  Acute Rehab OT Goals Patient Stated Goal: To walk better OT Goal Formulation: With patient Time For Goal Achievement: 12/01/19 Potential to Achieve Goals: Good  Plan Discharge plan remains appropriate;Frequency remains appropriate    Co-evaluation                 AM-PAC OT "6 Clicks" Daily Activity     Outcome Measure   Help from another person eating meals?: None Help from another person taking care of personal grooming?: None Help from another person toileting, which includes using toliet, bedpan, or urinal?: A Lot Help from another person bathing (including washing, rinsing, drying)?: A Lot Help from another person to put on and taking off regular upper body clothing?: None Help from another person to put on and taking off regular lower body clothing?: A Little 6 Click Score: 19    End of Session    OT Visit Diagnosis: Other abnormalities of gait  and mobility (R26.89);Pain Pain - Right/Left: Right Pain - part of body: Hip   Activity Tolerance Patient tolerated treatment well   Patient Left with call bell/phone within reach;in chair;with chair alarm set   Nurse Communication          Time: 7253-6644 OT Time Calculation (min): 50 min  Charges: OT General Charges $OT Visit: 1 Visit OT Treatments $Self Care/Home Management : 38-52 mins  Myrtie Hawk Amijah Timothy, OTR/L 11/18/19, 1:28 PM

## 2019-11-18 NOTE — Progress Notes (Signed)
PROGRESS NOTE    Jason Montes  ZOX:[id number] DOB: 02-Jul-1950 DOA: 11/14/2019 PCP: [facility]   Assessment & Plan:   Principal Problem:   Intertrochanteric fracture of right femur, closed, initial encounter ([place]) Active Problems:   Atrial fibrillation ([place])   CAD (coronary artery disease)   DM (diabetes mellitus), type 2, uncontrolled with complications ([place])   Chronic diastolic heart failure (HCC)   Chronic anticoagulation   Essential hypertension   PVD (peripheral vascular disease) ([place])   Accidental fall   Preoperative clearance   Closed right hip fracture, initial encounter ([place])   Intertrochanteric fracture of right femur: secondary to accidental fall. With severe peripheral vascular disease s/p recent revascularization. S/p intramedullary fixation of right intertrochanteric hip fracture 11/16/19. PT/OT recs SNF   PAF: continue on carvedilol & eliquis  CAD: no chest pain. Continue on carvedilol, lisinopril  DM2: poorly controlled. HbA1c 13.8. Continue on lantus, SSI w/ accuchecks   Chronic systolic heart failure: appears euvolemic. Echo shows EF 81-19%, normal diastolic function. Continue carvedilol, torsemide,  lisinopril.   CKDII: baseline Cr is unknown. Cr is labile. Will continue to monitor   Likely ACD: w/ a component of post op anemia. No need for a transfusion at this time. Will continue to monitor   Essential hypertension: continue lisinopril, carvedilol   PVD: recently underwent revascularization left leg on 10/10/2019 by Dr. Lucky Cowboy due to nonhealing ulcer, still present. Hx of right transmetatarsal amputation  Diabetic foot ulcer left foot: recommends outpatient f/u as per podiatry. Continue w/ wound care   Thrombocytopenia: etiology unclear. Will continue to monitor   Constipation: continue on miralax, senokot, & colace   DVT prophylaxis: heparin  Code Status: full  Family Communication:  Disposition Plan: PT/OT recs SNF but is a  registered sex offender so will be unable to go to SNF as per ortho surg. When stable for d/c pt will go home w/ home health    Consultants:   Ortho surg   Podiatry   Procedures:  S/p intramedullary fixation of right intertrochanteric hip fracture 11/16/19     Antimicrobials:    Subjective: Pt c/o difficulty walking  Objective: Vitals:   11/17/19 0747 11/17/19 1159 11/17/19 1505 11/18/19 0038  BP: (!) 107/55 (!) 107/54 (!) 93/46 (!) 99/57  Pulse: 74 81 81 79  Resp: 17 17 16 17   Temp: 97.6 F (36.4 C) 98.1 F (36.7 C) 98.2 F (36.8 C) 98 F (36.7 C)  TempSrc: Oral Oral Oral Oral  SpO2: 99% 94% 97% 97%  Weight:        Intake/Output Summary (Last 24 hours) at 11/18/2019 0719 Last data filed at 11/18/2019 0408 Gross per 24 hour  Intake 360 ml  Output 1700 ml  Net -1340 ml   Filed Weights   11/15/19 0100  Weight: 81.6 kg    Examination:  General exam: Appears calm and comfortable  Respiratory system: Clear to auscultation. No rales, rhonchi Cardiovascular system: S1/S2+. No  rubs, gallops or clicks.  Gastrointestinal system: Abdomen is nondistended, soft and nontender. Hypoactive bowel sounds heard. Central nervous system: Alert and oriented. Moves all 4 extremities  Psychiatry: Judgement and insight appear normal. Flat mood and affect     Data Reviewed: I have personally reviewed following labs and imaging studies  CBC: Recent Labs  Lab 11/15/19 0036 11/16/19 0419 11/17/19 0601 11/18/19 0540  WBC 6.3 6.7 7.3 6.5  NEUTROABS 4.4  --   --   --   HGB 10.5* 9.6* 8.4* 8.3*  HCT 34.0* 30.4* 26.8* 25.4*  MCV 91.4 91.8 92.7 88.2  PLT PLATELET CLUMPS NOTED ON SMEAR, UNABLE TO ESTIMATE 140* 103* 626*   Basic Metabolic Panel: Recent Labs  Lab 11/15/19 0036 11/16/19 0419 11/17/19 0601 11/18/19 0540  NA 136 137 133* 135  K 4.1 3.8 4.2 4.4  CL 103 104 100 103  CO2 21* 25 24 28   GLUCOSE 198* 132* 215* 175*  BUN 40* 32* 35* 39*  CREATININE 1.57* 1.68*  1.55* 1.63*  CALCIUM 8.9 8.8* 8.6* 8.6*   GFR: Estimated Creatinine Clearance: 44.6 mL/min (A) (by C-G formula based on SCr of 1.63 mg/dL (H)). Liver Function Tests: No results for input(s): AST, ALT, ALKPHOS, BILITOT, PROT, ALBUMIN in the last 168 hours. No results for input(s): LIPASE, AMYLASE in the last 168 hours. No results for input(s): AMMONIA in the last 168 hours. Coagulation Profile: Recent Labs  Lab 11/15/19 0036 11/16/19 0419  INR 1.6* 1.5*   Cardiac Enzymes: No results for input(s): CKTOTAL, CKMB, CKMBINDEX, TROPONINI in the last 168 hours. BNP (last 3 results) No results for input(s): PROBNP in the last 8760 hours. HbA1C: No results for input(s): HGBA1C in the last 72 hours. CBG: Recent Labs  Lab 11/17/19 1159 11/17/19 1605 11/17/19 2012 11/17/19 2344 11/18/19 0406  GLUCAP 269* 200* 228* 228* 186*   Lipid Profile: No results for input(s): CHOL, HDL, LDLCALC, TRIG, CHOLHDL, LDLDIRECT in the last 72 hours. Thyroid Function Tests: No results for input(s): TSH, T4TOTAL, FREET4, T3FREE, THYROIDAB in the last 72 hours. Anemia Panel: No results for input(s): VITAMINB12, FOLATE, FERRITIN, TIBC, IRON, RETICCTPCT in the last 72 hours. Sepsis Labs: No results for input(s): PROCALCITON, LATICACIDVEN in the last 168 hours.  Recent Results (from the past 240 hour(s))  SARS Coronavirus 2 by RT PCR (hospital order, performed in [facility] hospital lab) Nasopharyngeal Nasopharyngeal Swab     Status: None   Collection Time: 11/15/19 12:36 AM   Specimen: Nasopharyngeal Swab  Result Value Ref Range Status   SARS Coronavirus 2 NEGATIVE NEGATIVE Final    Comment: (NOTE) SARS-CoV-2 target nucleic acids are NOT DETECTED.  The SARS-CoV-2 RNA is generally detectable in upper and lower respiratory specimens during the acute phase of infection. The lowest concentration of SARS-CoV-2 viral copies this assay can detect is 250 copies / mL. A negative result does not preclude  SARS-CoV-2 infection and should not be used as the sole basis for treatment or other patient management decisions.  A negative result may occur with improper specimen collection / handling, submission of specimen other than nasopharyngeal swab, presence of viral mutation(s) within the areas targeted by this assay, and inadequate number of viral copies (<250 copies / mL). A negative result must be combined with clinical observations, patient history, and epidemiological information.  Fact Sheet for Patients:   StrictlyIdeas.no  Fact Sheet for Healthcare Providers: BankingDealers.co.za  This test is not yet approved or  cleared by the [place] FDA and has been authorized for detection and/or diagnosis of SARS-CoV-2 by FDA under an Emergency Use Authorization (EUA).  This EUA will remain in effect (meaning this test can be used) for the duration of the COVID-19 declaration under Section 564(b)(1) of the Act, 21 U.S.C. section 360bbb-3(b)(1), unless the authorization is terminated or revoked sooner.  Performed at [facility], [address] [place],  [address]   Surgical pcr screen     Status: Abnormal   Collection Time: 11/15/19  4:05 AM   Specimen: Nasal Mucosa; Nasal  Swab  Result Value Ref Range Status   MRSA, PCR POSITIVE (A) NEGATIVE Final    Comment: RESULT CALLED TO, READ BACK BY AND VERIFIED WITH: Jamse Belfast RN [phone] 11/15/19 HNM    Staphylococcus aureus POSITIVE (A) NEGATIVE Final    Comment: (NOTE) The Xpert SA Assay (FDA approved for NASAL specimens in patients 69 years of age and older), is one component of a comprehensive surveillance program. It is not intended to diagnose infection nor to guide or monitor treatment. Performed at [facility], [address] [place], [place] [address]          Radiology Studies: DG HIP UNILAT WITH PELVIS 1V RIGHT  Result Date:  11/16/2019 CLINICAL DATA:  Postop EXAM: DG HIP (WITH OR WITHOUT PELVIS) 1V RIGHT COMPARISON:  11/15/2019 FINDINGS: Interval intramedullary rod and distal screw fixation of the proximal right femur for intertrochanteric fracture. Anatomic alignment. Intact hardware. Gas in the soft tissues of the thigh and hip consistent with recent surgery. Pubic symphysis and rami are intact. Extensive vascular calcification. IMPRESSION: Interval intramedullary rod and screw fixation of proximal right femur for intertrochanteric fracture. Electronically Signed   By: Donavan Foil M.D.   On: 11/16/2019 15:45   DG HIP OPERATIVE UNILAT W OR W/O PELVIS RIGHT  Result Date: 11/16/2019 CLINICAL DATA:  Right hip surgery EXAM: OPERATIVE right HIP (WITH PELVIS IF PERFORMED) 21 VIEWS TECHNIQUE: Fluoroscopic spot image(s) were submitted for interpretation post-operatively. COMPARISON:  11/15/2019 FINDINGS: Twenty-one low resolution intraoperative spot views of the right hip. Total fluoroscopy time was 1 minutes 27 seconds. The initial images demonstrate normal alignment of right femur, intertrochanteric fracture difficult to visualize likely due to resolution. Subsequent images were obtained during operative course of intramedullary rod and distal screw fixation of the right femoral fracture. IMPRESSION: Intraoperative fluoroscopic assistance provided during surgical fixation of right femoral fracture. Electronically Signed   By: Donavan Foil M.D.   On: 11/16/2019 15:47        Scheduled Meds: . apixaban  5 mg Oral BID  . aspirin EC  81 mg Oral Daily  . atorvastatin  40 mg Oral QHS  . carvedilol  3.125 mg Oral BID WC  . Chlorhexidine Gluconate Cloth  6 each Topical Q0600  . docusate sodium  100 mg Oral BID  . famotidine  20 mg Oral Daily  . gabapentin  300 mg Oral BID  . insulin aspart  0-15 Units Subcutaneous Q4H  . lisinopril  2.5 mg Oral Daily  . mupirocin ointment  1 application Nasal BID  . pneumococcal 23 valent  vaccine  0.5 mL Intramuscular Tomorrow-1000  . Ensure Max Protein  11 oz Oral Daily  . senna  1 tablet Oral BID  . tamsulosin  0.4 mg Oral Daily  . torsemide  20 mg Oral Daily  . traMADol  50 mg Oral Q6H   Continuous Infusions:   LOS: 3 days    Time spent: 33 mins     Wyvonnia Dusky, MD [facility] Pager [phone]  If 7PM-7AM, please contact night-coverage [email or web address] 11/18/2019, 7:19 AM

## 2019-11-18 NOTE — Progress Notes (Signed)
Subjective:  POD #2 s/p intramedullary fixation of right intertrochanteric hip fracture.   Patient reports right hip pain as mild.  Patient up out of bed to a chair.  Objective:   VITALS:   Vitals:   11/17/19 1159 11/17/19 1505 11/18/19 0038 11/18/19 0748  BP: (!) 107/54 (!) 93/46 (!) 99/57 (!) 115/54  Pulse: 81 81 79 86  Resp: 17 16 17 18   Temp: 98.1 F (36.7 C) 98.2 F (36.8 C) 98 F (36.7 C) 98.4 F (36.9 C)  TempSrc: Oral Oral Oral Oral  SpO2: 94% 97% 97% 95%  Weight:        PHYSICAL EXAM: Right lower extremity: Patient with transmetatarsal amputation of the right foot.  With advanced peripheral neuropathy and peripheral vascular disease with diminished sensation light touch below his right knee which is his baseline. Incision: moderate drainage No cellulitis present Compartment soft  LABS  Results for orders placed or performed during the hospital encounter of 11/14/19 (from the past 24 hour(s))  Glucose, capillary     Status: Abnormal   Collection Time: 11/17/19  4:05 PM  Result Value Ref Range   Glucose-Capillary 200 (H) 70 - 99 mg/dL   Comment 1 Notify RN   Glucose, capillary     Status: Abnormal   Collection Time: 11/17/19  8:12 PM  Result Value Ref Range   Glucose-Capillary 228 (H) 70 - 99 mg/dL  Glucose, capillary     Status: Abnormal   Collection Time: 11/17/19 11:44 PM  Result Value Ref Range   Glucose-Capillary 228 (H) 70 - 99 mg/dL  Glucose, capillary     Status: Abnormal   Collection Time: 11/18/19  4:06 AM  Result Value Ref Range   Glucose-Capillary 186 (H) 70 - 99 mg/dL   Comment 1 Notify RN   CBC     Status: Abnormal   Collection Time: 11/18/19  5:40 AM  Result Value Ref Range   WBC 6.5 4.0 - 10.5 K/uL   RBC 2.88 (L) 4.22 - 5.81 MIL/uL   Hemoglobin 8.3 (L) 13.0 - 17.0 g/dL   HCT 25.4 (L) 39 - 52 %   MCV 88.2 80.0 - 100.0 fL   MCH 28.8 26.0 - 34.0 pg   MCHC 32.7 30.0 - 36.0 g/dL   RDW 14.5 11.5 - 15.5 %   Platelets 128 (L) 150 - 400  K/uL   nRBC 0.0 0.0 - 0.2 %  Basic metabolic panel     Status: Abnormal   Collection Time: 11/18/19  5:40 AM  Result Value Ref Range   Sodium 135 135 - 145 mmol/L   Potassium 4.4 3.5 - 5.1 mmol/L   Chloride 103 98 - 111 mmol/L   CO2 28 22 - 32 mmol/L   Glucose, Bld 175 (H) 70 - 99 mg/dL   BUN 39 (H) 8 - 23 mg/dL   Creatinine, Ser 1.63 (H) 0.61 - 1.24 mg/dL   Calcium 8.6 (L) 8.9 - 10.3 mg/dL   GFR calc non Af Amer 43 (L) >60 mL/min   GFR calc Af Amer 50 (L) >60 mL/min   Anion gap 4 (L) 5 - 15  Glucose, capillary     Status: Abnormal   Collection Time: 11/18/19 12:07 PM  Result Value Ref Range   Glucose-Capillary 216 (H) 70 - 99 mg/dL   Comment 1 Notify RN     DG HIP UNILAT WITH PELVIS 1V RIGHT  Result Date: 11/16/2019 CLINICAL DATA:  Postop EXAM: DG HIP (WITH OR WITHOUT PELVIS)  1V RIGHT COMPARISON:  11/15/2019 FINDINGS: Interval intramedullary rod and distal screw fixation of the proximal right femur for intertrochanteric fracture. Anatomic alignment. Intact hardware. Gas in the soft tissues of the thigh and hip consistent with recent surgery. Pubic symphysis and rami are intact. Extensive vascular calcification. IMPRESSION: Interval intramedullary rod and screw fixation of proximal right femur for intertrochanteric fracture. Electronically Signed   By: Donavan Foil M.D.   On: 11/16/2019 15:45   DG HIP OPERATIVE UNILAT W OR W/O PELVIS RIGHT  Result Date: 11/16/2019 CLINICAL DATA:  Right hip surgery EXAM: OPERATIVE right HIP (WITH PELVIS IF PERFORMED) 21 VIEWS TECHNIQUE: Fluoroscopic spot image(s) were submitted for interpretation post-operatively. COMPARISON:  11/15/2019 FINDINGS: Twenty-one low resolution intraoperative spot views of the right hip. Total fluoroscopy time was 1 minutes 27 seconds. The initial images demonstrate normal alignment of right femur, intertrochanteric fracture difficult to visualize likely due to resolution. Subsequent images were obtained during operative  course of intramedullary rod and distal screw fixation of the right femoral fracture. IMPRESSION: Intraoperative fluoroscopic assistance provided during surgical fixation of right femoral fracture. Electronically Signed   By: Donavan Foil M.D.   On: 11/16/2019 15:47    Assessment/Plan: 2 Days Post-Op   Principal Problem:   Intertrochanteric fracture of right femur, closed, initial encounter ([place]) Active Problems:   Atrial fibrillation ([place])   CAD (coronary artery disease)   DM (diabetes mellitus), type 2, uncontrolled with complications (H[facility])   Chronic diastolic heart failure (HCC)   Chronic anticoagulation   Essential hypertension   PVD (peripheral vascular disease) ([place])   Accidental fall   Preoperative clearance   Closed right hip fracture, initial encounter [facility])  The patient is progressing postop.  He is doing well from an orthopedic standpoint.  Attending physical therapy.  Continue Eliquis.  Patient may be weightbearing as tolerated on the right lower extremity.  He will follow-up in the office in 10 to 14 days following discharge from the hospital.    Thornton [place] , MD 11/18/2019, 12:52 PM

## 2019-11-18 NOTE — Progress Notes (Signed)
Physical Therapy Treatment Patient Details Name: Jason Montes MRN: [id number] DOB: 04-01-1951 Today's Date: 11/18/2019    History of Present Illness Per MD notes: Pt is a 69 y.o. 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 Pt diagnosed with R Intertrochanteric hip fx and is s/p IM fixation.    PT Comments    Pt premedicated before session about 1 hour prior for improved outcomes.  Participated in exercises as described below.  To EOB with rail and min assist.  Heavily dependant on rail for mobility.  Once sitting, he is generally steady.  Shoes donned per MD orders with assist.  He is able to stand with mod a x 1 and after gaining balance, he is able to progress gait with slow step to gait pattern with min a x 1 and recliner follow for safety.  He takes several self initiated rest breaks and needs to sit after 20'.  While he is motivated and does push himself in session, pain, weakness and balance remain primary deficits and limiting factors.    Will continue with mobility per goals in anticipation of pt not being able to transition to SNF. SNF remains appropriate for discharge plan.  Pt stated he has a queen size bed at boarding house.  No rails.  He would benefit from a hospital bed with rails to assist in bed mobility if available to pt.  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  Will continue to assess need for wheelchair for mobility but it is likely he will need one.   Follow Up Recommendations  SNF;Other (comment)     Equipment Recommendations  Wheelchair (measurements PT);Wheelchair  cushion (measurements PT);3in1 (PT) , hospital bed.   Recommendations for Other Services       Precautions / Restrictions Precautions Precautions: Fall Restrictions Weight Bearing Restrictions: Yes RLE Weight Bearing: Weight bearing as tolerated LLE Weight Bearing: Weight bearing as tolerated Other Position/Activity Restrictions: Pt with L diabetic foot ulcer, WBAT per Dr. Luana Shu vebal order 11/17/19 with a shoe donned, does not have to be a post-op shoe    Mobility  Bed Mobility Overal bed mobility: Needs Assistance Bed Mobility: Supine to Sit     Supine to sit: Min assist     General bed mobility comments: improved mobility today but heavy use of rail and increased time.  Transfers Overall transfer level: Needs assistance Equipment used: Rolling walker (2 wheeled) Transfers: Sit to/from Stand Sit to Stand: Min assist;Mod assist         General transfer comment: increased time to complete, some increased assist this am  Ambulation/Gait Ambulation/Gait assistance: Min assist Gait Distance (Feet): 20 Feet Assistive device: Rolling walker (2 wheeled) Gait Pattern/deviations: Step-to pattern;Antalgic;Decreased stance time - right;Decreased step length - left Gait velocity: decreased   General Gait Details: heavy lean on RW but able to progress gait, several self initiated rest breaks but motivated to continue.  Limited by fatigue.  B shoes donned for mobility.   Stairs             Wheelchair Mobility    Modified Rankin (Stroke Patients Only)  Balance Overall balance assessment: Needs assistance Sitting-balance support: Feet supported Sitting balance-Leahy Scale: Good     Standing balance support: Bilateral upper extremity supported Standing balance-Leahy Scale: Poor Standing balance comment: very heavy lean on RW with hand on assist at all times.                            Cognition Arousal/Alertness: Awake/alert Behavior During  Therapy: WFL for tasks assessed/performed Overall Cognitive Status: Within Functional Limits for tasks assessed                                 General Comments: grossly oriented, pleasant and engaged in therapy      Exercises Total Joint Exercises Ankle Circles/Pumps: AROM;Strengthening;Both;10 reps Quad Sets: Strengthening;Both;10 reps Gluteal Sets: Strengthening;Both;10 reps Hip ABduction/ADduction: AAROM;Right;10 reps Straight Leg Raises: AAROM;Right;10 reps;Supine;Standing Long Arc Quad: AROM;Strengthening;Both;10 Theatre manager in Standing: AROM;Strengthening;Both;5 reps;Standing    General Comments        Pertinent Vitals/Pain Pain Assessment: 0-10 Pain Score: 6  Pain Location: more comfortable this session but continues limited by pain and weakness  "Why does it still hurt so bad?" Pain Descriptors / Indicators: Aching;Sore Pain Intervention(s): Premedicated before session;Repositioned;Monitored during session;Limited activity within patient's tolerance    Home Living                      Prior Function            PT Goals (current goals can now be found in the care plan section) Progress towards PT goals: Progressing toward goals    Frequency    BID      PT Plan Current plan remains appropriate;Other (comment)    Co-evaluation              AM-PAC PT "6 Clicks" Mobility   Outcome Measure  Help needed turning from your back to your side while in a flat bed without using bedrails?: A Lot Help needed moving from lying on your back to sitting on the side of a flat bed without using bedrails?: A Lot Help needed moving to and from a bed to a chair (including a wheelchair)?: A Little Help needed standing up from a chair using your arms (e.g., wheelchair or bedside chair)?: A Lot Help needed to walk in hospital room?: A Lot Help needed climbing 3-5 steps with a railing? : Total 6 Click Score: 12    End of Session Equipment  Utilized During Treatment: Gait belt Activity Tolerance: Patient tolerated treatment well;Patient limited by pain Patient left: in chair;with call bell/phone within reach;with chair alarm set Nurse Communication: Mobility status Pain - Right/Left: Right Pain - part of body: Hip     Time: 8527-7824 PT Time Calculation (min) (ACUTE ONLY): 23 min  Charges:  $Gait Training: 8-22 mins $Therapeutic Exercise: 8-22 mins                    Chesley Noon, PTA 11/18/19, 10:43 AM

## 2019-11-18 NOTE — Anesthesia Postprocedure Evaluation (Signed)
Anesthesia Post Note  Patient: Jason Montes  Procedure(s) Performed: INTRAMEDULLARY (IM) NAIL INTERTROCHANTRIC (Right )  Patient location during evaluation: PACU Anesthesia Type: General Level of consciousness: awake and alert and oriented Pain management: pain level controlled Vital Signs Assessment: post-procedure vital signs reviewed and stable Respiratory status: spontaneous breathing Cardiovascular status: blood pressure returned to baseline Anesthetic complications: no   No complications documented.   Last Vitals:  Vitals:   11/18/19 0038 11/18/19 0748  BP: (!) 99/57 (!) 115/54  Pulse: 79 86  Resp: 17 18  Temp: 36.7 C 36.9 C  SpO2: 97% 95%    Last Pain:  Vitals:   11/18/19 0748  TempSrc: Oral  PainSc:                  [name]

## 2019-11-19 LAB — CBC
HCT: 24.5 % — ABNORMAL LOW (ref 39.0–52.0)
Hemoglobin: 7.7 g/dL — ABNORMAL LOW (ref 13.0–17.0)
MCH: 28.9 pg (ref 26.0–34.0)
MCHC: 31.4 g/dL (ref 30.0–36.0)
MCV: 92.1 fL (ref 80.0–100.0)
Platelets: 150 10*3/uL (ref 150–400)
RBC: 2.66 MIL/uL — ABNORMAL LOW (ref 4.22–5.81)
RDW: 14.6 % (ref 11.5–15.5)
WBC: 6.3 10*3/uL (ref 4.0–10.5)
nRBC: 0 % (ref 0.0–0.2)

## 2019-11-19 LAB — BASIC METABOLIC PANEL
Anion gap: 10 (ref 5–15)
BUN: 46 mg/dL — ABNORMAL HIGH (ref 8–23)
CO2: 23 mmol/L (ref 22–32)
Calcium: 8.8 mg/dL — ABNORMAL LOW (ref 8.9–10.3)
Chloride: 99 mmol/L (ref 98–111)
Creatinine, Ser: 1.64 mg/dL — ABNORMAL HIGH (ref 0.61–1.24)
GFR calc Af Amer: 50 mL/min — ABNORMAL LOW (ref >60)
GFR calc non Af Amer: 43 mL/min — ABNORMAL LOW (ref >60)
Glucose, Bld: 251 mg/dL — ABNORMAL HIGH (ref 70–99)
Potassium: 5.1 mmol/L (ref 3.5–5.1)
Sodium: 132 mmol/L — ABNORMAL LOW (ref 135–145)

## 2019-11-19 LAB — GLUCOSE, CAPILLARY
Glucose-Capillary: 200 mg/dL — ABNORMAL HIGH (ref 70–99)
Glucose-Capillary: 248 mg/dL — ABNORMAL HIGH (ref 70–99)
Glucose-Capillary: 192 mg/dL — ABNORMAL HIGH (ref 70–99)

## 2019-11-19 LAB — GLUCOSE, POCT (MANUAL RESULT ENTRY): POC Glucose: 282 mg/dl — AB (ref 70–99)

## 2019-11-19 MED ORDER — PEG 3350-KCL-NA BICARB-NACL 420 G PO SOLR
4000.0000 mL | Freq: Once | ORAL | Status: DC
  Filled 2019-11-19: qty 4000

## 2019-11-19 NOTE — Progress Notes (Signed)
Physical Therapy Treatment Patient Details Name: Jason Montes MRN: [id number] DOB: 09-14-1950 Today's Date: 11/19/2019    History of Present Illness Per MD notes: Pt is a 69 y.o. 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 Pt diagnosed with R Intertrochanteric hip fx and is s/p IM fixation.    PT Comments    Pt resting in bed upon PT arrival and pt reporting having a "bad day" d/t family issues but pt did not expand on it.  Pt also verbalizing concerns regarding his pain not improving daily like he thought it would (pt pre-medicated with pain medication for therapy session).  Able to progress to semi-supine to sitting edge of bed with close SBA (increased effort and time to perform on own); mod assist to stand from bed and from recliner; and CGA to min assist to ambulate 12 feet with RW (short standing rest break requested by pt at 10 feet but then after walking another 2 feet pt reporting UE fatigue using RW and requesting to sit down in recliner).  8/10 R hip/thigh pain with activity; 3-4/10 at rest end of session.  Will continue to focus on strengthening and progressive functional mobility per pt tolerance.    Follow Up Recommendations  SNF     Equipment Recommendations  Wheelchair (measurements PT);Wheelchair cushion (measurements PT);3in1 (PT)    Recommendations for Other Services       Precautions / Restrictions Precautions Precautions: Fall Restrictions Weight Bearing Restrictions: Yes RLE Weight Bearing: Weight bearing as tolerated LLE Weight Bearing: Weight bearing as tolerated Other Position/Activity Restrictions: Pt with L diabetic foot ulcer,  WBAT per Dr. Luana Shu vebal order 11/17/19 with a shoe donned, does not have to be a post-op shoe    Mobility  Bed Mobility Overal bed mobility: Needs Assistance Bed Mobility: Supine to Sit     Supine to sit: Supervision;HOB elevated     General bed mobility comments: increased effort and time to perform on own; use of bed rail  Transfers Overall transfer level: Needs assistance Equipment used: Rolling walker (2 wheeled) Transfers: Sit to/from [facility] Sit to Stand: Min assist;Mod assist Stand pivot transfers: Min guard       General transfer comment: assist to initiate and come to full stand from bed and from recliner; vc's for UE/LE placement and overall technique; step step turn bed to recliner with RW  Ambulation/Gait Ambulation/Gait assistance: Min guard;Min assist Gait Distance (Feet): 12 Feet Assistive device: Rolling walker (2 wheeled) Gait Pattern/deviations: Antalgic;Decreased stance time - right;Decreased step length - left Gait velocity: decreased   General Gait Details: heavy UE support on RW; pt with short standing rest break after 10 feet but then requesting to sit after ambulating another 2 feet d/t UE fatigue   Stairs             Wheelchair Mobility    Modified Rankin (Stroke Patients Only)       Balance Overall balance assessment: Needs assistance Sitting-balance support: No upper extremity supported;Feet supported Sitting balance-Leahy Scale: Good Sitting balance - Comments: steady sitting reaching within BOS   Standing balance support: Single extremity supported Standing balance-Leahy Scale: Poor Standing balance comment: pt requiring at least single UE support on RW for  balance                            Cognition Arousal/Alertness: Awake/alert Behavior During Therapy: WFL for tasks assessed/performed Overall Cognitive Status: Within Functional Limits for tasks assessed                                         Exercises Total Joint Exercises Ankle Circles/Pumps: AROM;Strengthening;Both;10 reps;Supine Quad Sets: AROM;Strengthening;Both;10 reps;Supine Short Arc Quad: AAROM;Right;AROM;Left;Strengthening;10 reps;Supine Heel Slides: AAROM;Right;AROM;Left;Strengthening;10 reps;Supine    General Comments General comments (skin integrity, edema, etc.): red drainage noted R hip dressing.  Nursing cleared pt for participation in physical therapy.  Pt agreeable to PT session.      Pertinent Vitals/Pain Pain Assessment: 0-10 Pain Score: 4  Pain Location: R hip/thigh Pain Descriptors / Indicators: Aching;Sore Pain Intervention(s): Limited activity within patient's tolerance;Monitored during session;Premedicated before session;Repositioned  Vitals (HR and O2 on room air) stable and WFL throughout treatment session.    Home Living                      Prior Function            PT Goals (current goals can now be found in the care plan section) Acute Rehab PT Goals Patient Stated Goal: To walk better PT Goal Formulation: With patient Time For Goal Achievement: 11/30/19 Potential to Achieve Goals: Good Progress towards PT goals: Progressing toward goals    Frequency    BID      PT Plan Current plan remains appropriate    Co-evaluation              AM-PAC PT "6 Clicks" Mobility   Outcome Measure  Help needed turning from your back to your side while in a flat bed without using bedrails?: A Little Help needed moving from lying on your back to sitting on the side of a flat bed without using bedrails?: A Little Help needed moving to and from a bed to a chair (including a wheelchair)?: A Little Help needed standing up from a chair using your arms (e.g., wheelchair or bedside chair)?: A Lot Help needed to walk in hospital room?: A Lot Help needed climbing 3-5 steps with a railing? : Total 6 Click Score: 14    End of Session Equipment Utilized During  Treatment: Gait belt Activity Tolerance: Patient limited by fatigue;Patient limited by pain Patient left: in chair;with call bell/phone within reach;with chair alarm set;Other (comment) (B heels floating via pillows) Nurse Communication: Mobility status;Precautions;Other (comment) (pt's pain status) PT Visit Diagnosis: Unsteadiness on feet (R26.81);Other abnormalities of gait and mobility (R26.89);Muscle weakness (generalized) (M62.81);Pain Pain - Right/Left: Right Pain - part of body: Hip     Time: 2440-1027 PT Time Calculation (min) (ACUTE ONLY): 33 min  Charges:  $Therapeutic Exercise: 8-22 mins $Therapeutic Activity: 8-22 mins                     Leitha Bleak, PT 11/19/19, 1:28 PM

## 2019-11-19 NOTE — Progress Notes (Signed)
PROGRESS NOTE    Jason Montes  DJM:[id number] DOB: [birth year] DOA: 11/14/2019 PCP: [facility]   Assessment & Plan:   Principal Problem:   Intertrochanteric fracture of right femur, closed, initial encounter ([place]) Active Problems:   Atrial fibrillation ([place])   CAD (coronary artery disease)   DM (diabetes mellitus), type 2, uncontrolled with complications ([place])   Chronic diastolic heart failure ([facility])   Chronic anticoagulation   Essential hypertension   PVD (peripheral vascular disease) ([place])   Accidental fall   Preoperative clearance   Closed right hip fracture, initial encounter ([place])   Intertrochanteric fracture of right femur: secondary to accidental fall. With severe peripheral vascular disease s/p recent revascularization. S/p intramedullary fixation of right intertrochanteric hip fracture 11/16/19. PT/OT recs SNF   Generalized weakness: secondary to above. Continue to work w/ PT/OT inpatient as pt will unable to go to SNF   PAF: continue on carvedilol & eliquis  CAD: no chest pain. Continue on carvedilol, lisinopril  DM2: poorly controlled. HbA1c 13.8. Continue on lantus, SSI w/ accuchecks   Chronic systolic heart failure: appears euvolemic. Echo shows EF 22-29%, normal diastolic function. Continue carvedilol, torsemide,  lisinopril.   CKDII: baseline Cr is unknown. Cr is labile. Will continue to monitor   Likely ACD: w/ a component of post op anemia. No need for a transfusion at this time. Will continue to monitor   Essential hypertension: continue lisinopril, carvedilol   PVD: recently underwent revascularization left leg on 10/10/2019 by Dr. Lucky Cowboy due to nonhealing ulcer, still present. Hx of right transmetatarsal amputation  Diabetic foot ulcer left foot: recommends outpatient f/u as per podiatry. Continue w/ wound care   Thrombocytopenia: etiology unclear. Will continue to monitor   Constipation: continue on miralax, senokot, & colace. Start  golytely    DVT prophylaxis: heparin  Code Status: full  Family Communication:  Disposition Plan: PT/OT recs SNF but is a registered sex offender so will be unable to go to SNF as per ortho surg. When stable for d/c pt will go home w/ home health    Consultants:   Ortho surg   Podiatry   Procedures:  S/p intramedullary fixation of right intertrochanteric hip fracture 11/16/19     Antimicrobials:    Subjective: Pt c/o constipation   Objective: Vitals:   11/18/19 0748 11/18/19 1658 11/19/19 0002 11/19/19 0804  BP: (!) 115/54 119/75 (!) 102/58 93/67  Pulse: 86 81 79 81  Resp: 18 17 18 18   Temp: 98.4 F (36.9 C) 97.6 F (36.4 C) 98.3 F (36.8 C) 97.9 F (36.6 C)  TempSrc: Oral Oral Oral Oral  SpO2: 95% 96% 92% 90%  Weight:        Intake/Output Summary (Last 24 hours) at 11/19/2019 0810 Last data filed at 11/19/2019 0524 Gross per 24 hour  Intake 1050 ml  Output 275 ml  Net 775 ml   Filed Weights   11/15/19 0100  Weight: 81.6 kg    Examination:  General exam: Appears calm and comfortable  Respiratory system: Clear to auscultation. No wheezes. Normal RR Cardiovascular system: S1/S2+. No  rubs, gallops or clicks.  Gastrointestinal system: Abdomen is nondistended, soft and nontender. Hypoactive bowel sounds heard. Central nervous system: Alert and oriented. Moves all 4 extremities  Psychiatry: Judgement and insight appear normal. Flat mood and affect     Data Reviewed: I have personally reviewed following labs and imaging studies  CBC: Recent Labs  Lab 11/15/19 0036 11/16/19 0419 11/17/19 0601 11/18/19 0540 11/19/19  4854  WBC 6.3 6.7 7.3 6.5 6.3  NEUTROABS 4.4  --   --   --   --   HGB 10.5* 9.6* 8.4* 8.3* 7.7*  HCT 34.0* 30.4* 26.8* 25.4* 24.5*  MCV 91.4 91.8 92.7 88.2 92.1  PLT PLATELET CLUMPS NOTED ON SMEAR, UNABLE TO ESTIMATE 140* 103* 128* 627   Basic Metabolic Panel: Recent Labs  Lab 11/15/19 0036 11/16/19 0419 11/17/19 0601  11/18/19 0540 11/19/19 0407  NA 136 137 133* 135 132*  K 4.1 3.8 4.2 4.4 5.1  CL 103 104 100 103 99  CO2 21* 25 24 28 23   GLUCOSE 198* 132* 215* 175* 251*  BUN 40* 32* 35* 39* 46*  CREATININE 1.57* 1.68* 1.55* 1.63* 1.64*  CALCIUM 8.9 8.8* 8.6* 8.6* 8.8*   GFR: Estimated Creatinine Clearance: 44.3 mL/min (A) (by C-G formula based on SCr of 1.64 mg/dL (H)). Liver Function Tests: No results for input(s): AST, ALT, ALKPHOS, BILITOT, PROT, ALBUMIN in the last 168 hours. No results for input(s): LIPASE, AMYLASE in the last 168 hours. No results for input(s): AMMONIA in the last 168 hours. Coagulation Profile: Recent Labs  Lab 11/15/19 0036 11/16/19 0419  INR 1.6* 1.5*   Cardiac Enzymes: No results for input(s): CKTOTAL, CKMB, CKMBINDEX, TROPONINI in the last 168 hours. BNP (last 3 results) No results for input(s): PROBNP in the last 8760 hours. HbA1C: No results for input(s): HGBA1C in the last 72 hours. CBG: Recent Labs  Lab 11/18/19 0406 11/18/19 1207 11/18/19 1656 11/18/19 2002 11/19/19 0806  GLUCAP 186* 216* 175* 297* 200*   Lipid Profile: No results for input(s): CHOL, HDL, LDLCALC, TRIG, CHOLHDL, LDLDIRECT in the last 72 hours. Thyroid Function Tests: No results for input(s): TSH, T4TOTAL, FREET4, T3FREE, THYROIDAB in the last 72 hours. Anemia Panel: No results for input(s): VITAMINB12, FOLATE, FERRITIN, TIBC, IRON, RETICCTPCT in the last 72 hours. Sepsis Labs: No results for input(s): PROCALCITON, LATICACIDVEN in the last 168 hours.  Recent Results (from the past 240 hour(s))  SARS Coronavirus 2 by RT PCR (hospital order, performed in [facility] hospital lab) Nasopharyngeal Nasopharyngeal Swab     Status: None   Collection Time: 11/15/19 12:36 AM   Specimen: Nasopharyngeal Swab  Result Value Ref Range Status   SARS Coronavirus 2 NEGATIVE NEGATIVE Final    Comment: (NOTE) SARS-CoV-2 target nucleic acids are NOT DETECTED.  The SARS-CoV-2 RNA is generally  detectable in upper and lower respiratory specimens during the acute phase of infection. The lowest concentration of SARS-CoV-2 viral copies this assay can detect is 250 copies / mL. A negative result does not preclude SARS-CoV-2 infection and should not be used as the sole basis for treatment or other patient management decisions.  A negative result may occur with improper specimen collection / handling, submission of specimen other than nasopharyngeal swab, presence of viral mutation(s) within the areas targeted by this assay, and inadequate number of viral copies (<250 copies / mL). A negative result must be combined with clinical observations, patient history, and epidemiological information.  Fact Sheet for Patients:   StrictlyIdeas.no  Fact Sheet for Healthcare Providers: BankingDealers.co.za  This test is not yet approved or  cleared by the [place] FDA and has been authorized for detection and/or diagnosis of SARS-CoV-2 by FDA under an Emergency Use Authorization (EUA).  This EUA will remain in effect (meaning this test can be used) for the duration of the COVID-19 declaration under Section 564(b)(1) of the Act, 21 U.S.C. section 360bbb-3(b)(1), unless the  authorization is terminated or revoked sooner.  Performed at [facility], [address], [place], [place] [address]   Surgical pcr screen     Status: Abnormal   Collection Time: 11/15/19  4:05 AM   Specimen: Nasal Mucosa; Nasal Swab  Result Value Ref Range Status   MRSA, PCR POSITIVE (A) NEGATIVE Final    Comment: RESULT CALLED TO, READ BACK BY AND VERIFIED WITH: Jamse Belfast RN [phone] 11/15/19 HNM    Staphylococcus aureus POSITIVE (A) NEGATIVE Final    Comment: (NOTE) The Xpert SA Assay (FDA approved for NASAL specimens in patients 68 years of age and older), is one component of a comprehensive surveillance program. It is not intended to diagnose  infection nor to guide or monitor treatment. Performed at [facility], [address], [place],  [address]          Radiology Studies: No results found.      Scheduled Meds: . apixaban  5 mg Oral BID  . aspirin EC  81 mg Oral Daily  . atorvastatin  40 mg Oral QHS  . carvedilol  3.125 mg Oral BID WC  . Chlorhexidine Gluconate Cloth  6 each Topical Q0600  . docusate sodium  100 mg Oral BID  . famotidine  20 mg Oral Daily  . gabapentin  300 mg Oral BID  . insulin aspart  0-15 Units Subcutaneous TID WC  . insulin aspart  0-5 Units Subcutaneous QHS  . insulin glargine  5 Units Subcutaneous Daily  . lisinopril  2.5 mg Oral Daily  . mupirocin ointment  1 application Nasal BID  . pneumococcal 23 valent vaccine  0.5 mL Intramuscular Tomorrow-1000  . polyethylene glycol  17 g Oral BID  . Ensure Max Protein  11 oz Oral Daily  . senna  1 tablet Oral BID  . tamsulosin  0.4 mg Oral Daily  . torsemide  20 mg Oral Daily  . traMADol  50 mg Oral Q6H   Continuous Infusions:   LOS: 4 days    Time spent: 30 mins     Wyvonnia Dusky, MD [facility] Pager [phone]  If 7PM-7AM, please contact night-coverage [email or web address] 11/19/2019, 8:10 AM

## 2019-11-19 NOTE — Progress Notes (Signed)
Subjective:  POD #3 s/p intramedullary fixation for right intertrochanteric hip fracture.   Patient reports right hip pain as moderate. Patient up out of bed to a chair.  Objective:   VITALS:   Vitals:   11/18/19 0748 11/18/19 1658 11/19/19 0002 11/19/19 0804  BP: (!) 115/54 119/75 (!) 102/58 93/67  Pulse: 86 81 79 81  Resp: 18 17 18 18   Temp: 98.4 F (36.9 C) 97.6 F (36.4 C) 98.3 F (36.8 C) 97.9 F (36.6 C)  TempSrc: Oral Oral Oral Oral  SpO2: 95% 96% 92% 90%  Weight:        PHYSICAL EXAM: Right lower extremity: I personally change the patient's dressing today. Patient has serosanguineous drainage from the incision which is likely a result of his Eliquis. There is no erythema or fluctuance. There is no purulent drainage. There is congestion for infection. Patient's thigh compartment is swollen but compressible. Patient has diminished sensation to light touch in the right lower extremity at baseline due to advanced peripheral neuropathy. He has a transmetatarsal amputation of the right foot. His foot is well-perfused. His examination has not changed compared to earlier examinations during his hospital stay.   LABS  Results for orders placed or performed during the hospital encounter of 11/14/19 (from the past 24 hour(s))  Glucose, capillary     Status: Abnormal   Collection Time: 11/18/19  4:56 PM  Result Value Ref Range   Glucose-Capillary 175 (H) 70 - 99 mg/dL   Comment 1 Notify RN   Glucose, capillary     Status: Abnormal   Collection Time: 11/18/19  8:02 PM  Result Value Ref Range   Glucose-Capillary 297 (H) 70 - 99 mg/dL   Comment 1 Notify RN   Basic metabolic panel     Status: Abnormal   Collection Time: 11/19/19  4:07 AM  Result Value Ref Range   Sodium 132 (L) 135 - 145 mmol/L   Potassium 5.1 3.5 - 5.1 mmol/L   Chloride 99 98 - 111 mmol/L   CO2 23 22 - 32 mmol/L   Glucose, Bld 251 (H) 70 - 99 mg/dL   BUN 46 (H) 8 - 23 mg/dL   Creatinine, Ser 1.64 (H) 0.61  - 1.24 mg/dL   Calcium 8.8 (L) 8.9 - 10.3 mg/dL   GFR calc non Af Amer 43 (L) >60 mL/min   GFR calc Af Amer 50 (L) >60 mL/min   Anion gap 10 5 - 15  CBC     Status: Abnormal   Collection Time: 11/19/19  6:13 AM  Result Value Ref Range   WBC 6.3 4.0 - 10.5 K/uL   RBC 2.66 (L) 4.22 - 5.81 MIL/uL   Hemoglobin 7.7 (L) 13.0 - 17.0 g/dL   HCT 24.5 (L) 39 - 52 %   MCV 92.1 80.0 - 100.0 fL   MCH 28.9 26.0 - 34.0 pg   MCHC 31.4 30.0 - 36.0 g/dL   RDW 14.6 11.5 - 15.5 %   Platelets 150 150 - 400 K/uL   nRBC 0.0 0.0 - 0.2 %  Glucose, capillary     Status: Abnormal   Collection Time: 11/19/19  8:06 AM  Result Value Ref Range   Glucose-Capillary 200 (H) 70 - 99 mg/dL   Comment 1 Notify RN     No results found.  Assessment/Plan: 3 Days Post-Op   Principal Problem:   Intertrochanteric fracture of right femur, closed, initial encounter [facility]) Active Problems:   Atrial fibrillation ([place])   CAD (  coronary artery disease)   DM (diabetes mellitus), type 2, uncontrolled with complications ([facility])   Chronic diastolic heart failure ([facility])   Chronic anticoagulation   Essential hypertension   PVD (peripheral vascular disease) ([place])   Accidental fall   Preoperative clearance   Closed right hip fracture, initial encounter ([place])  Patient's dressing was changed today. Dressing may be changed as needed for drainage. He will continue physical therapy. He is weightbearing as tolerated on the right lower extremity. Patient may be discharged from an orthopedic standpoint once he is cleared from a safety standpoint from the physical therapy department.   Thornton Park , MD 11/19/2019, 12:19 PM

## 2019-11-19 NOTE — Progress Notes (Signed)
Physical Therapy Treatment Patient Details Name: Jason Montes MRN: [id number] DOB: April 03, 1951 Today's Date: 11/19/2019    History of Present Illness Per MD notes: Pt is a 69 y.o. 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 Pt diagnosed with R Intertrochanteric hip fx and is s/p IM fixation.    PT Comments    Pt was seated on [facility] upon therapist arriving. He agrees to PT session and is cooperative throughout. Reports 6/10 pain. Pt stood, required assistance with hygiene prior to ambulating 30 ft, 20, 10 ft with RW without LOB or unsteadiness. Pt however has very slow antalgic gait pattern and requires encouragement to ambulate further distances. He was supine in bed at conclusion of session with call bell in reach, bed alarm in place, and RN aware of pt's abilities. Acute PT will continue to progress pt as able per tolerance. Pt will not be able to DC to SNF per notes. Unsafe to DC home safely at this time.     Follow Up Recommendations  SNF     Equipment Recommendations  Wheelchair (measurements PT);Wheelchair cushion (measurements PT);3in1 (PT)    Recommendations for Other Services       Precautions / Restrictions Precautions Precautions: Fall Restrictions Weight Bearing Restrictions: Yes RLE Weight Bearing: Weight bearing as tolerated LLE Weight Bearing: Weight bearing as tolerated Other Position/Activity Restrictions: Pt with L diabetic foot ulcer, WBAT per Dr. Luana Shu vebal order 11/17/19 with a shoe donned, does not have to be a post-op shoe    Mobility  Bed Mobility Overal bed mobility: Needs Assistance Bed Mobility: Sit to Supine     Supine to  sit: Supervision;HOB elevated Sit to supine: Min guard   General bed mobility comments: Pt was able to progress BLEs into bed from EOB.   Transfers Overall transfer level: Needs assistance Equipment used: Rolling walker (2 wheeled) Transfers: Sit to/from Stand Sit to Stand: Min assist Stand pivot transfers: Min guard       General transfer comment: Pt was seated on BSC upon arriving. he was able to STS from [facility] with min assist + vcs. He performed STS from standard chair with armrest 2 x between gait trails.  Ambulation/Gait Ambulation/Gait assistance: Min guard Gait Distance (Feet): 30 Feet Assistive device: Rolling walker (2 wheeled) Gait Pattern/deviations: Antalgic;Decreased stance time - right;Decreased step length - left Gait velocity: decreased   General Gait Details: Slow antalgic step to gait pattern.   Stairs             Wheelchair Mobility    Modified Rankin (Stroke Patients Only)       Balance Overall balance assessment: Needs assistance Sitting-balance support: No upper extremity supported;Feet supported Sitting balance-Leahy Scale: Good Sitting balance - Comments: steady sitting reaching within BOS   Standing balance support: Single extremity supported Standing balance-Leahy Scale: Fair Standing balance comment: pt requiring at least single UE support on RW for balance                            Cognition Arousal/Alertness: Awake/alert Behavior During Therapy: WFL for tasks assessed/performed Overall Cognitive Status: Within Functional Limits for tasks assessed  General Comments: Pt is alert throughout and cooperative. able to follow commands throughout.      Exercises Total Joint Exercises Ankle Circles/Pumps: AROM;Strengthening;Both;10 reps;Supine Quad Sets: AROM;Strengthening;Both;10 reps;Supine Short Arc Quad: AAROM;Right;AROM;Left;Strengthening;10 reps;Supine Heel Slides:  AAROM;Right;AROM;Left;Strengthening;10 reps;Supine    General Comments General comments (skin integrity, edema, etc.): red drainage noted R hip dressing      Pertinent Vitals/Pain Pain Assessment: No/denies pain Pain Score: 6  Faces Pain Scale: Hurts even more Pain Location: R hip/thigh Pain Descriptors / Indicators: Aching;Sore Pain Intervention(s): Limited activity within patient's tolerance;Monitored during session;Premedicated before session;Repositioned    Home Living                      Prior Function            PT Goals (current goals can now be found in the care plan section) Acute Rehab PT Goals Patient Stated Goal: To walk better PT Goal Formulation: With patient Time For Goal Achievement: 11/30/19 Potential to Achieve Goals: Good Progress towards PT goals: Progressing toward goals    Frequency    BID      PT Plan Current plan remains appropriate    Co-evaluation              AM-PAC PT "6 Clicks" Mobility   Outcome Measure  Help needed turning from your back to your side while in a flat bed without using bedrails?: A Little Help needed moving from lying on your back to sitting on the side of a flat bed without using bedrails?: A Little Help needed moving to and from a bed to a chair (including a wheelchair)?: A Little Help needed standing up from a chair using your arms (e.g., wheelchair or bedside chair)?: A Lot Help needed to walk in hospital room?: A Lot Help needed climbing 3-5 steps with a railing? : Total 6 Click Score: 14    End of Session Equipment Utilized During Treatment: Gait belt Activity Tolerance: Patient tolerated treatment well;Patient limited by pain Patient left: in bed;with call bell/phone within reach;with bed alarm set;with family/visitor present Nurse Communication: Mobility status;Precautions;Other (comment) PT Visit Diagnosis: Unsteadiness on feet (R26.81);Other abnormalities of gait and mobility  (R26.89);Muscle weakness (generalized) (M62.81);Pain Pain - Right/Left: Right Pain - part of body: Hip     Time: 1406-1430 PT Time Calculation (min) (ACUTE ONLY): 24 min  Charges:  $Gait Training: 8-22 mins $Therapeutic Exercise: 8-22 mins $Therapeutic Activity: 8-22 mins                     [name] Fusi PTA 11/19/19, 2:42 PM

## 2019-11-20 LAB — CBC
HCT: 21.7 % — ABNORMAL LOW (ref 39.0–52.0)
Hemoglobin: 7 g/dL — ABNORMAL LOW (ref 13.0–17.0)
MCH: 28.6 pg (ref 26.0–34.0)
MCHC: 32.3 g/dL (ref 30.0–36.0)
MCV: 88.6 fL (ref 80.0–100.0)
Platelets: 166 10*3/uL (ref 150–400)
RBC: 2.45 MIL/uL — ABNORMAL LOW (ref 4.22–5.81)
RDW: 14.6 % (ref 11.5–15.5)
WBC: 6.2 10*3/uL (ref 4.0–10.5)
nRBC: 0 % (ref 0.0–0.2)

## 2019-11-20 LAB — BASIC METABOLIC PANEL
Anion gap: 9 (ref 5–15)
BUN: 47 mg/dL — ABNORMAL HIGH (ref 8–23)
CO2: 24 mmol/L (ref 22–32)
Calcium: 9.1 mg/dL (ref 8.9–10.3)
Chloride: 97 mmol/L — ABNORMAL LOW (ref 98–111)
Creatinine, Ser: 1.78 mg/dL — ABNORMAL HIGH (ref 0.61–1.24)
GFR calc Af Amer: 45 mL/min — ABNORMAL LOW (ref >60)
GFR calc non Af Amer: 39 mL/min — ABNORMAL LOW (ref >60)
Glucose, Bld: 216 mg/dL — ABNORMAL HIGH (ref 70–99)
Potassium: 4.5 mmol/L (ref 3.5–5.1)
Sodium: 130 mmol/L — ABNORMAL LOW (ref 135–145)

## 2019-11-20 LAB — GLUCOSE, CAPILLARY
Glucose-Capillary: 242 mg/dL — ABNORMAL HIGH (ref 70–99)
Glucose-Capillary: 242 mg/dL — ABNORMAL HIGH (ref 70–99)
Glucose-Capillary: 302 mg/dL — ABNORMAL HIGH (ref 70–99)

## 2019-11-20 LAB — GLUCOSE, POCT (MANUAL RESULT ENTRY): POC Glucose: 201 mg/dl — AB (ref 70–99)

## 2019-11-20 LAB — PREPARE RBC (CROSSMATCH)

## 2019-11-20 MED ORDER — SODIUM CHLORIDE 0.9 % IV SOLN: INTRAVENOUS | Status: DC

## 2019-11-20 MED ORDER — SODIUM CHLORIDE 0.9% IV SOLUTION: Freq: Once | INTRAVENOUS | Status: AC

## 2019-11-20 NOTE — Progress Notes (Signed)
PROGRESS NOTE    DEMONTAE Montes  NTI:[id number] DOB: 07-10-1950 DOA: 11/14/2019 PCP: [facility]   Assessment & Plan:   Principal Problem:   Intertrochanteric fracture of right femur, closed, initial encounter ([place]) Active Problems:   Atrial fibrillation ([place])   CAD (coronary artery disease)   DM (diabetes mellitus), type 2, uncontrolled with complications ([place])   Chronic diastolic heart failure ([facility])   Chronic anticoagulation   Essential hypertension   PVD (peripheral vascular disease) ([place])   Accidental fall   Preoperative clearance   Closed right hip fracture, initial encounter ([place])   Intertrochanteric fracture of right femur: secondary to accidental fall. With severe peripheral vascular disease s/p recent revascularization. S/p intramedullary fixation of right intertrochanteric hip fracture 11/16/19. PT/OT recs SNF   Generalized weakness: secondary to above. Continue to work w/ PT/OT inpatient as pt will unable to go to SNF   PAF: continue on carvedilol & eliquis  CAD: no chest pain. Continue on carvedilol, lisinopril  DM2: poorly controlled. HbA1c 13.8. Continue on lantus, SSI w/ accuchecks   Chronic systolic heart failure: appears euvolemic. Echo shows EF 86-76%, normal diastolic function. Continue carvedilol, torsemide,  lisinopril.   CKDII: baseline Cr is unknown. Cr is labile. Will continue to monitor   Hyponatremia: will start gentle IVFs. Will continue to monitor   Likely ACD: w/ a component of post op anemia. H&H are trending down, will transfuse if Hb <7. Will continue to monitor   Essential hypertension: continue lisinopril, carvedilol   PVD: recently underwent revascularization left leg on 10/10/2019 by Dr. Lucky Cowboy due to nonhealing ulcer, still present. Hx of right transmetatarsal amputation  Diabetic foot ulcer left foot: recommends outpatient f/u as per podiatry. Continue w/ wound care   Thrombocytopenia: resolved  Constipation:  continue on miralax, senokot, & colace. Had bowel movement 11/19/19   DVT prophylaxis: heparin  Code Status: full  Family Communication:  Disposition Plan: PT/OT recs SNF but is a registered sex offender so will be unable to go to SNF as per ortho surg. When stable for d/c pt will go home w/ home health    Consultants:   Ortho surg   Podiatry   Procedures:  S/p intramedullary fixation of right intertrochanteric hip fracture 11/16/19     Antimicrobials:    Subjective: Pt c/o weakness  Objective: Vitals:   11/19/19 0002 11/19/19 0804 11/19/19 1531 11/19/19 2311  BP: (!) 102/58 93/67 (!) 96/51 (!) 103/46  Pulse: 79 81 82 83  Resp: 18 18 15 18   Temp: 98.3 F (36.8 C) 97.9 F (36.6 C) 98.1 F (36.7 C) 99.1 F (37.3 C)  TempSrc: Oral Oral Oral Oral  SpO2: 92% 90% 97% 96%  Weight:        Intake/Output Summary (Last 24 hours) at 11/20/2019 0737 Last data filed at 11/19/2019 2240 Gross per 24 hour  Intake 1050 ml  Output 800 ml  Net 250 ml   Filed Weights   11/15/19 0100  Weight: 81.6 kg    Examination:  General exam: Appears calm and comfortable  Respiratory system: Clear to auscultation. No rales, rhonchi. Normal RR Cardiovascular system: S1/S2+. No  rubs, gallops or clicks.  Gastrointestinal system: Abdomen is nondistended, soft and nontender. Normal bowel sounds heard. Central nervous system: Alert and oriented. Moves all 4 extremities  Psychiatry: Judgement and insight appear normal. Flat mood and affect     Data Reviewed: I have personally reviewed following labs and imaging studies  CBC: Recent Labs  Lab 11/15/19  0036 11/15/19 0036 11/16/19 0419 11/17/19 0601 11/18/19 0540 11/19/19 0613 11/20/19 0427  WBC 6.3   < > 6.7 7.3 6.5 6.3 6.2  NEUTROABS 4.4  --   --   --   --   --   --   HGB 10.5*   < > 9.6* 8.4* 8.3* 7.7* 7.0*  HCT 34.0*   < > 30.4* 26.8* 25.4* 24.5* 21.7*  MCV 91.4   < > 91.8 92.7 88.2 92.1 88.6  PLT PLATELET CLUMPS NOTED ON  SMEAR, UNABLE TO ESTIMATE   < > 140* 103* 128* 150 166   < > = values in this interval not displayed.   Basic Metabolic Panel: Recent Labs  Lab 11/16/19 0419 11/17/19 0601 11/18/19 0540 11/19/19 0407 11/20/19 0427  NA 137 133* 135 132* 130*  K 3.8 4.2 4.4 5.1 4.5  CL 104 100 103 99 97*  CO2 25 24 28 23 24   GLUCOSE 132* 215* 175* 251* 216*  BUN 32* 35* 39* 46* 47*  CREATININE 1.68* 1.55* 1.63* 1.64* 1.78*  CALCIUM 8.8* 8.6* 8.6* 8.8* 9.1   GFR: Estimated Creatinine Clearance: 40.8 mL/min (A) (by C-G formula based on SCr of 1.78 mg/dL (H)). Liver Function Tests: No results for input(s): AST, ALT, ALKPHOS, BILITOT, PROT, ALBUMIN in the last 168 hours. No results for input(s): LIPASE, AMYLASE in the last 168 hours. No results for input(s): AMMONIA in the last 168 hours. Coagulation Profile: Recent Labs  Lab 11/15/19 0036 11/16/19 0419  INR 1.6* 1.5*   Cardiac Enzymes: No results for input(s): CKTOTAL, CKMB, CKMBINDEX, TROPONINI in the last 168 hours. BNP (last 3 results) No results for input(s): PROBNP in the last 8760 hours. HbA1C: No results for input(s): HGBA1C in the last 72 hours. CBG: Recent Labs  Lab 11/18/19 1656 11/18/19 2002 11/19/19 0806 11/19/19 1627 11/19/19 2046  GLUCAP 175* 297* 200* 248* 192*   Lipid Profile: No results for input(s): CHOL, HDL, LDLCALC, TRIG, CHOLHDL, LDLDIRECT in the last 72 hours. Thyroid Function Tests: No results for input(s): TSH, T4TOTAL, FREET4, T3FREE, THYROIDAB in the last 72 hours. Anemia Panel: No results for input(s): VITAMINB12, FOLATE, FERRITIN, TIBC, IRON, RETICCTPCT in the last 72 hours. Sepsis Labs: No results for input(s): PROCALCITON, LATICACIDVEN in the last 168 hours.  Recent Results (from the past 240 hour(s))  SARS Coronavirus 2 by RT PCR (hospital order, performed in [facility] hospital lab) Nasopharyngeal Nasopharyngeal Swab     Status: None   Collection Time: 11/15/19 12:36 AM   Specimen:  Nasopharyngeal Swab  Result Value Ref Range Status   SARS Coronavirus 2 NEGATIVE NEGATIVE Final    Comment: (NOTE) SARS-CoV-2 target nucleic acids are NOT DETECTED.  The SARS-CoV-2 RNA is generally detectable in upper and lower respiratory specimens during the acute phase of infection. The lowest concentration of SARS-CoV-2 viral copies this assay can detect is 250 copies / mL. A negative result does not preclude SARS-CoV-2 infection and should not be used as the sole basis for treatment or other patient management decisions.  A negative result may occur with improper specimen collection / handling, submission of specimen other than nasopharyngeal swab, presence of viral mutation(s) within the areas targeted by this assay, and inadequate number of viral copies (<250 copies / mL). A negative result must be combined with clinical observations, patient history, and epidemiological information.  Fact Sheet for Patients:   StrictlyIdeas.no  Fact Sheet for Healthcare Providers: BankingDealers.co.za  This test is not yet approved or  cleared by  the [employer] and has been authorized for detection and/or diagnosis of SARS-CoV-2 by FDA under an Emergency Use Authorization (EUA).  This EUA will remain in effect (meaning this test can be used) for the duration of the COVID-19 declaration under Section 564(b)(1) of the Act, 21 U.S.C. section 360bbb-3(b)(1), unless the authorization is terminated or revoked sooner.  Performed at [facility], [address] [place], [place] [address]   Surgical pcr screen     Status: Abnormal   Collection Time: 11/15/19  4:05 AM   Specimen: Nasal Mucosa; Nasal Swab  Result Value Ref Range Status   MRSA, PCR POSITIVE (A) NEGATIVE Final    Comment: RESULT CALLED TO, READ BACK BY AND VERIFIED WITH: Jamse Belfast RN [phone] 11/15/19 HNM    Staphylococcus aureus POSITIVE (A) NEGATIVE Final     Comment: (NOTE) The Xpert SA Assay (FDA approved for NASAL specimens in patients 15 years of age and older), is one component of a comprehensive surveillance program. It is not intended to diagnose infection nor to guide or monitor treatment. Performed at [facility], [address] [place], [place] [address]          Radiology Studies: No results found.      Scheduled Meds: . apixaban  5 mg Oral BID  . aspirin EC  81 mg Oral Daily  . atorvastatin  40 mg Oral QHS  . carvedilol  3.125 mg Oral BID WC  . docusate sodium  100 mg Oral BID  . famotidine  20 mg Oral Daily  . gabapentin  300 mg Oral BID  . insulin aspart  0-15 Units Subcutaneous TID WC  . insulin aspart  0-5 Units Subcutaneous QHS  . insulin glargine  5 Units Subcutaneous Daily  . lisinopril  2.5 mg Oral Daily  . mupirocin ointment  1 application Nasal BID  . pneumococcal 23 valent vaccine  0.5 mL Intramuscular Tomorrow-1000  . polyethylene glycol  17 g Oral BID  . polyethylene glycol-electrolytes  4,000 mL Oral Once  . Ensure Max Protein  11 oz Oral Daily  . senna  1 tablet Oral BID  . tamsulosin  0.4 mg Oral Daily  . torsemide  20 mg Oral Daily  . traMADol  50 mg Oral Q6H   Continuous Infusions:   LOS: 5 days    Time spent: 31 mins     Wyvonnia Dusky, MD [facility] Pager [phone]  If 7PM-7AM, please contact night-coverage [email or web address] 11/20/2019, 7:37 AM

## 2019-11-20 NOTE — Progress Notes (Signed)
Physical Therapy Treatment Patient Details Name: Jason Montes MRN: [id number] DOB: 01-27-1951 Today's Date: 11/20/2019    History of Present Illness Per MD notes: Pt is a 68 y.o. 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 Pt diagnosed with R Intertrochanteric hip fx and is s/p IM fixation.    PT Comments    Pt was supine in bed upon arriving. He agrees to PT session with encouragement. Lengthy discussion on importance of getting moving better for a safe DC. He states understanding but is anxious that he will be DC prior to being safe. Pain meds were given before PT session however pt still reports 10/10 pain with movements/wt bearing. He was able to exit bed with mod assist today. More severe posterior push present than previous date. Stood with mod assist + max vcs + gait belt. Therapist questions pt's efforts during session however pt did tolerate increased ambulation. He was able to ambulate to RN station with [facility] follow+  slow antalgic gait sequencing. Pain continues to limit pt's abilities. Therapist issued HEP handout for pt to perform. He states understanding and reports he will continue to perform throughout the day. At conclusion of session, pt was in bed with bed alarm in place, call bell in reach, and RN aware of his abilities. Acute PT will continue efforts to progress pt as able.    Follow Up Recommendations  SNF     Equipment Recommendations  Wheelchair (measurements PT);Wheelchair cushion (measurements PT);3in1 (PT)    Recommendations for Other Services       Precautions / Restrictions Precautions Precautions: Fall Restrictions Weight Bearing  Restrictions: Yes RLE Weight Bearing: Weight bearing as tolerated Other Position/Activity Restrictions: Pt with L diabetic foot ulcer, WBAT per Dr. Luana Shu vebal order 11/17/19 with a shoe donned, does not have to be a post-op shoe    Mobility  Bed Mobility Overal bed mobility: Needs Assistance Bed Mobility: Supine to Sit;Sit to Supine     Supine to sit: Min assist Sit to supine: Min assist   General bed mobility comments: Pt required increased assistance today versus previous date. Pt reports more pain today than previous session. He is very slow moving with incraesed time and vcs throughout for technique.Therapist questions pt's efforts this date.  Transfers Overall transfer level: Needs assistance Equipment used: Rolling walker (2 wheeled) Transfers: Sit to/from Stand Sit to Stand: Mod assist         General transfer comment: pt has more severe posterior push this date and required mod assist to achieve standing. min assist for eccentric control with stand to sit. pt required several minutes of standing to find balance prior to ambulating with RW.   Ambulation/Gait Ambulation/Gait assistance: Min assist;Min guard Gait Distance (Feet): 70 Feet Assistive device: Rolling walker (2 wheeled) Gait Pattern/deviations: Antalgic;Decreased stance time - right;Decreased step length - left Gait velocity: decreased   General Gait Details: pt was able to ambulate from EOB out to nursing station with RW + gait belt. min assist at first 2/2 to posterior lean however pt was able to progress to CGA only. Very slow, antalgic step to pattern.   Stairs             Emergency planning/management officer  Modified Rankin (Stroke Patients Only)       Balance Overall balance assessment: Needs assistance Sitting-balance support: No upper extremity supported;Feet supported Sitting balance-Leahy Scale: Fair Sitting balance - Comments: pt has slight posterior push this date in sitting requiring Min/CGA    Standing balance support: Bilateral upper extremity supported Standing balance-Leahy Scale: Poor Standing balance comment: severe posterior push upon standing and several; minutes to improve fwd wt shift                            Cognition Arousal/Alertness: Awake/alert Behavior During Therapy: WFL for tasks assessed/performed Overall Cognitive Status: Within Functional Limits for tasks assessed                                 General Comments: Pt is alert throughout and cooperative. able to follow commands throughout.      Exercises Total Joint Exercises Ankle Circles/Pumps: AROM;Strengthening;Both;10 reps;Supine Quad Sets: AROM;Strengthening;Both;10 reps;Supine Gluteal Sets: Strengthening;Both;10 reps;Supine Heel Slides: AAROM;Right;AROM;Left;Strengthening;10 reps;Supine Hip ABduction/ADduction: AAROM;Right;10 reps Straight Leg Raises: AAROM;Right;10 reps;Supine Other Exercises Other Exercises: therapist issued HEP handout and pt demonstrated ability to perform    General Comments        Pertinent Vitals/Pain Pain Assessment: 0-10 Pain Score: 10-Worst pain ever Pain Location: R hip/thigh Pain Descriptors / Indicators: Aching;Sore Pain Intervention(s): Limited activity within patient's tolerance;Monitored during session;Premedicated before session    Home Living                      Prior Function            PT Goals (current goals can now be found in the care plan section) Acute Rehab PT Goals Patient Stated Goal: To walk better    Frequency    BID      PT Plan Current plan remains appropriate    Co-evaluation              AM-PAC PT "6 Clicks" Mobility   Outcome Measure  Help needed turning from your back to your side while in a flat bed without using bedrails?: A Little Help needed moving from lying on your back to sitting on the side of a flat bed without using bedrails?: A Little Help needed moving to and  from a bed to a chair (including a wheelchair)?: A Little Help needed standing up from a chair using your arms (e.g., wheelchair or bedside chair)?: A Lot Help needed to walk in hospital room?: A Lot Help needed climbing 3-5 steps with a railing? : Total 6 Click Score: 14    End of Session Equipment Utilized During Treatment: Gait belt Activity Tolerance: Patient tolerated treatment well;Patient limited by pain Patient left: in bed;with call bell/phone within reach;with bed alarm set;with family/visitor present Nurse Communication: Mobility status;Precautions;Other (comment) PT Visit Diagnosis: Unsteadiness on feet (R26.81);Other abnormalities of gait and mobility (R26.89);Muscle weakness (generalized) (M62.81);Pain Pain - Right/Left: Right Pain - part of body: Hip     Time: 9417-4081 PT Time Calculation (min) (ACUTE ONLY): 36 min  Charges:  $Gait Training: 8-22 mins $Therapeutic Exercise: 8-22 mins                     [name] Fusi PTA 11/20/19, 12:11 PM

## 2019-11-20 NOTE — Progress Notes (Signed)
Subjective:  POD #4 s/p IM excision of right intertrochanteric hip fracture.   Patient reports right hip pain as mild to moderate.  Patient was reported by his RN to have significant serosanguineous drainage on his dressing that I changed yesterday.  Patient is on Eliquis.  Objective:   VITALS:   Vitals:   11/19/19 0804 11/19/19 1531 11/19/19 2311 11/20/19 0803  BP: 93/67 (!) 96/51 (!) 103/46 113/64  Pulse: 81 82 83 81  Resp: 18 15 18 16   Temp: 97.9 F (36.6 C) 98.1 F (36.7 C) 99.1 F (37.3 C) 98.2 F (36.8 C)  TempSrc: Oral Oral Oral Oral  SpO2: 90% 97% 96% 94%  Weight:        PHYSICAL EXAM: Right lower extremity; Patient's nurse has changed his dressing again and reinforced it with a pressure dressing.  Currently the patient has only a scant amount of serosanguineous drainage.  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  LABS  Results for orders placed or performed during the hospital encounter of 11/14/19 (from the past 24 hour(s))  Glucose, capillary     Status: Abnormal   Collection Time: 11/19/19  4:27 PM  Result Value Ref Range   Glucose-Capillary 248 (H) 70 - 99 mg/dL   Comment 1 Notify RN   Glucose, capillary     Status: Abnormal   Collection Time: 11/19/19  8:46 PM  Result Value Ref Range   Glucose-Capillary 192 (H) 70 - 99 mg/dL  Basic metabolic panel     Status: Abnormal   Collection Time: 11/20/19  4:27 AM  Result Value Ref Range   Sodium 130 (L) 135 - 145 mmol/L   Potassium 4.5 3.5 - 5.1 mmol/L   Chloride 97 (L) 98 - 111 mmol/L   CO2 24 22 - 32 mmol/L   Glucose, Bld 216 (H) 70 - 99 mg/dL   BUN 47 (H) 8 - 23 mg/dL   Creatinine, Ser 1.78 (H) 0.61 - 1.24 mg/dL   Calcium 9.1 8.9 - 10.3 mg/dL   GFR calc non Af Amer 39 (L) >60 mL/min   GFR calc Af Amer 45 (L) >60 mL/min   Anion gap 9 5 - 15  CBC     Status: Abnormal    Collection Time: 11/20/19  4:27 AM  Result Value Ref Range   WBC 6.2 4.0 - 10.5 K/uL   RBC 2.45 (L) 4.22 - 5.81 MIL/uL   Hemoglobin 7.0 (L) 13.0 - 17.0 g/dL   HCT 21.7 (L) 39 - 52 %   MCV 88.6 80.0 - 100.0 fL   MCH 28.6 26.0 - 34.0 pg   MCHC 32.3 30.0 - 36.0 g/dL   RDW 14.6 11.5 - 15.5 %   Platelets 166 150 - 400 K/uL   nRBC 0.0 0.0 - 0.2 %  POCT glucose (manual entry)     Status: Abnormal   Collection Time: 11/20/19  7:58 AM  Result Value Ref Range   POC Glucose 201 (A) 70 - 99 mg/dl  Glucose, capillary     Status: Abnormal   Collection Time: 11/20/19 12:29 PM  Result Value Ref Range   Glucose-Capillary 242 (H) 70 - 99 mg/dL   Comment 1 Notify RN     No results found.  Assessment/Plan: 4 Days Post-Op   Principal Problem:   Intertrochanteric fracture of right femur, closed, initial encounter ([place]) Active Problems:  Atrial fibrillation (HCC)   CAD (coronary artery disease)   DM (diabetes mellitus), type 2, uncontrolled with complications (HCC)   Chronic diastolic heart failure (HCC)   Chronic anticoagulation   Essential hypertension   PVD (peripheral vascular disease) ([place])   Accidental fall   Preoperative clearance   Closed right hip fracture, initial encounter [facility])  Patient has a hemoglobin today of 7.  I am going to discussed with the medical service about possible transfusion.  Patient's Eliquis is causing increased drainage from his surgical site.  I will also discussed with the medical team whether there are any alternatives to Eliquis for the time being.    Thornton Park , MD 11/20/2019, 1:25 PM

## 2019-11-21 LAB — CBC
HCT: 25.9 % — ABNORMAL LOW (ref 39.0–52.0)
Hemoglobin: 8.8 g/dL — ABNORMAL LOW (ref 13.0–17.0)
MCH: 29.4 pg (ref 26.0–34.0)
MCHC: 34 g/dL (ref 30.0–36.0)
MCV: 86.6 fL (ref 80.0–100.0)
Platelets: 126 10*3/uL — ABNORMAL LOW (ref 150–400)
RBC: 2.99 MIL/uL — ABNORMAL LOW (ref 4.22–5.81)
RDW: 14.1 % (ref 11.5–15.5)
WBC: 6.4 10*3/uL (ref 4.0–10.5)
nRBC: 0 % (ref 0.0–0.2)

## 2019-11-21 LAB — GLUCOSE, CAPILLARY
Glucose-Capillary: 150 mg/dL — ABNORMAL HIGH (ref 70–99)
Glucose-Capillary: 201 mg/dL — ABNORMAL HIGH (ref 70–99)
Glucose-Capillary: 255 mg/dL — ABNORMAL HIGH (ref 70–99)
Glucose-Capillary: 282 mg/dL — ABNORMAL HIGH (ref 70–99)
Glucose-Capillary: 164 mg/dL — ABNORMAL HIGH (ref 70–99)
Glucose-Capillary: 229 mg/dL — ABNORMAL HIGH (ref 70–99)
Glucose-Capillary: 201 mg/dL — ABNORMAL HIGH (ref 70–99)
Glucose-Capillary: 203 mg/dL — ABNORMAL HIGH (ref 70–99)

## 2019-11-21 LAB — TYPE AND SCREEN
ABO/RH(D): O POS
Antibody Screen: NEGATIVE
Unit division: 0

## 2019-11-21 LAB — BASIC METABOLIC PANEL
Anion gap: 7 (ref 5–15)
BUN: 47 mg/dL — ABNORMAL HIGH (ref 8–23)
CO2: 26 mmol/L (ref 22–32)
Calcium: 8.7 mg/dL — ABNORMAL LOW (ref 8.9–10.3)
Chloride: 100 mmol/L (ref 98–111)
Creatinine, Ser: 1.65 mg/dL — ABNORMAL HIGH (ref 0.61–1.24)
GFR calc Af Amer: 49 mL/min — ABNORMAL LOW (ref >60)
GFR calc non Af Amer: 43 mL/min — ABNORMAL LOW (ref >60)
Glucose, Bld: 211 mg/dL — ABNORMAL HIGH (ref 70–99)
Potassium: 4.3 mmol/L (ref 3.5–5.1)
Sodium: 133 mmol/L — ABNORMAL LOW (ref 135–145)

## 2019-11-21 LAB — BPAM RBC
Blood Product Expiration Date: 202107222359
ISSUE DATE / TIME: 202106202150
Unit Type and Rh: 5100

## 2019-11-21 NOTE — Progress Notes (Signed)
Inpatient Diabetes Program Recommendations  AACE/ADA: New Consensus Statement on Inpatient Glycemic Control (2015)  Target Ranges:  Prepandial:   less than 140 mg/dL      Peak postprandial:   less than 180 mg/dL (1-2 hours)      Critically ill patients:  140 - 180 mg/dL   Results for Jason Montes, Jason Montes (MRN [id number]) as of 11/21/2019 12:11  Ref. Range 11/20/2019 07:58 11/20/2019 12:29 11/20/2019 15:47 11/20/2019 21:07  Glucose-Capillary Latest Ref Range: 70 - 99 mg/dL 201 (H)  5 units NOVOLOG  242 (H)  5 units NOVOLOG +  5 units LANTUS  242 (H)  5 units NOVOLOG  302 (H)  4 units NOVOLOG    Results for Jason Montes, Jason Montes (MRN [id number]) as of 11/21/2019 12:11  Ref. Range 11/21/2019 07:36 11/21/2019 11:59  Glucose-Capillary Latest Ref Range: 70 - 99 mg/dL 164 (H)  3 units NOVOLOG +  5 units LANTUS 229 (H)    Home DM Meds: Jardiance 12.5 mg Daily       Metformin 500 mg BID       Ozempic 1.5 mg Qweek   Current Orders: Lantus 5 units Daily      Novolog Moderate Correction Scale/ SSI (0-15 units) TID AC + HS      MD- Note CBGs elevated after meals.  Please consider adding Novolog Meal Coverage to current in-hospital insulin regimen while home meds are on hold:  Novolog 4 units TID with meals  (Please add the following Hold Parameters: Hold if pt eats <50% of meal, Hold if pt NPO)    --Will follow patient during hospitalization--  Wyn Quaker RN, MSN, CDE Diabetes Coordinator Inpatient Glycemic Control Team Team Pager: [phone] (8a-5p)

## 2019-11-21 NOTE — Progress Notes (Signed)
Subjective:  POD #5 s/p intramedullary fixation for right intertrochanteric hip fracture.   Patient reports right hip pain as moderate.  Patient states he has increased pain in the right hip today.  He was working hard with physical therapy by his assessment.  Patient's Eliquis was temporarily stopped to help decrease drainage from his surgical site.  Objective:   VITALS:   Vitals:   11/21/19 0900 11/21/19 1113 11/21/19 1116 11/21/19 1531  BP: (!) 113/47 (!) 93/46 (!) 97/50 (!) 110/58  Pulse:  80 79 78  Resp:    17  Temp:    98.1 F (36.7 C)  TempSrc:    Oral  SpO2:    97%  Weight:        PHYSICAL EXAM: Right lower extremity: Patient has reduced drainage from his incisions.  His dressing remains clean dry and intact. Patient has baseline peripheral neuropathy from uncontrolled diabetes in both lower extremities.   LABS  Results for orders placed or performed during the hospital encounter of 11/14/19 (from the past 24 hour(s))  Glucose, capillary     Status: Abnormal   Collection Time: 11/20/19  9:07 PM  Result Value Ref Range   Glucose-Capillary 302 (H) 70 - 99 mg/dL   Comment 1 Notify RN   Basic metabolic panel     Status: Abnormal   Collection Time: 11/21/19  4:33 AM  Result Value Ref Range   Sodium 133 (L) 135 - 145 mmol/L   Potassium 4.3 3.5 - 5.1 mmol/L   Chloride 100 98 - 111 mmol/L   CO2 26 22 - 32 mmol/L   Glucose, Bld 211 (H) 70 - 99 mg/dL   BUN 47 (H) 8 - 23 mg/dL   Creatinine, Ser 1.65 (H) 0.61 - 1.24 mg/dL   Calcium 8.7 (L) 8.9 - 10.3 mg/dL   GFR calc non Af Amer 43 (L) >60 mL/min   GFR calc Af Amer 49 (L) >60 mL/min   Anion gap 7 5 - 15  CBC     Status: Abnormal   Collection Time: 11/21/19  4:33 AM  Result Value Ref Range   WBC 6.4 4.0 - 10.5 K/uL   RBC 2.99 (L) 4.22 - 5.81 MIL/uL   Hemoglobin 8.8 (L) 13.0 - 17.0 g/dL   HCT 25.9 (L) 39 - 52 %   MCV 86.6 80.0 - 100.0 fL   MCH 29.4 26.0 - 34.0 pg   MCHC 34.0 30.0 - 36.0 g/dL   RDW 14.1 11.5 - 15.5  %   Platelets 126 (L) 150 - 400 K/uL   nRBC 0.0 0.0 - 0.2 %  Glucose, capillary     Status: Abnormal   Collection Time: 11/21/19  7:36 AM  Result Value Ref Range   Glucose-Capillary 164 (H) 70 - 99 mg/dL  Glucose, capillary     Status: Abnormal   Collection Time: 11/21/19 11:59 AM  Result Value Ref Range   Glucose-Capillary 229 (H) 70 - 99 mg/dL    No results found.  Assessment/Plan: 5 Days Post-Op   Principal Problem:   Intertrochanteric fracture of right femur, closed, initial encounter ([place]) Active Problems:   Atrial fibrillation ([place])   CAD (coronary artery disease)   DM (diabetes mellitus), type 2, uncontrolled with complications (HCC)   Chronic diastolic heart failure (HCC)   Chronic anticoagulation   Essential hypertension   PVD (peripheral vascular disease) ([place])   Accidental fall   Preoperative clearance   Closed right hip fracture, initial encounter ([place])  Progressing  from an orthopedic standpoint.  Eliquis can likely be restarted tomorrow for his atrial fibrillation.  Patient need to be discharged from an orthopedic standpoint when cleared medically.  Patient will follow up with Dr. Mack Guise in the office in approximately 2 weeks after discharge.  Patient is weightbearing as tolerated on the right lower extremity.    Thornton Park , MD 11/21/2019, 4:30 PM

## 2019-11-21 NOTE — Progress Notes (Signed)
Physical Therapy Treatment Patient Details Name: Jason Montes MRN: [id number] DOB: Mar 08, 1951 Today's Date: 11/21/2019    History of Present Illness Per MD notes: Pt is a 69 y.o. 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 Pt diagnosed with R Intertrochanteric hip fx and is s/p IM fixation.    PT Comments    Pt ready for session.  Stood from recliner x 2 with emphasis on control.  He is able to walk to bathroom to void in standing then continue 40' in hallway before fatigue.  Sat in wheelchair and taken to rehab gym for stair training.  He is able to go up with Bilateral rails and min a x 1.  Generally does well but is fearful to try with 1 rail as at home.  One at the top, he becomes fearful and needs +2 assist for comfort and encouragement "I'm going to fall!"  He is able to descend with bilateral rails with mod encouragement and obvious fear.  Returns to room via wheelchair and transfers back to bed with min guard and min assist to get LE's back into bed.  Pt has called aide and he is unable to enter apartment without him present.  Room will have to be rearranged after he is discharged home.  Given performance on stairs today and generally inability to safely navigate at this time, EMS transport is appropriate for discharge home.  SWS aware.   Follow Up Recommendations  SNF     Equipment Recommendations  Wheelchair (measurements PT);Wheelchair cushion (measurements PT);3in1 (PT)    Recommendations for Other Services       Precautions / Restrictions Precautions Precautions: Fall Restrictions Weight Bearing Restrictions: Yes RLE Weight Bearing: Weight  bearing as tolerated LLE Weight Bearing: Weight bearing as tolerated Other Position/Activity Restrictions: Pt with L diabetic foot ulcer, WBAT per Dr. Luana Shu vebal order 11/17/19 with a shoe donned, does not have to be a post-op shoe    Mobility  Bed Mobility Overal bed mobility: Needs Assistance Bed Mobility: Sit to Supine       Sit to supine: Min assist   General bed mobility comments: for LE's on L side of bed, able to do his own on R side  Transfers Overall transfer level: Needs assistance Equipment used: Rolling walker (2 wheeled) Transfers: Sit to/from Stand Sit to Stand: Min guard (min guard from inc height, mod a from lower wheelchair)         General transfer comment: extended time, uses chair for support on BLE's  Ambulation/Gait Ambulation/Gait assistance: Min assist;Min guard Gait Distance (Feet): 60 Feet Assistive device: Rolling walker (2 wheeled) Gait Pattern/deviations: Antalgic;Decreased stance time - right;Decreased step length - left Gait velocity: decreased       Stairs Stairs: Yes Stairs assistance: Min assist;+2 safety/equipment Stair Management: Two rails;Step to pattern;Forwards Number of Stairs: 4 General stair comments: does well going up but fearful coming down requiring +2 assist for confidence/safety.  only has one rail at home but uses 2 for comfort.   Wheelchair Mobility    Modified Rankin (Stroke Patients Only)       Balance Overall balance assessment: Needs assistance Sitting-balance support: No upper extremity supported;Feet supported Sitting balance-Leahy Scale: Fair  Standing balance support: Bilateral upper extremity supported Standing balance-Leahy Scale: Poor Standing balance comment: requires BUE support at all times but no overt LOB today                            Cognition Arousal/Alertness: Awake/alert Behavior During Therapy: WFL for tasks assessed/performed Overall Cognitive Status: Within  Functional Limits for tasks assessed                                 General Comments: Pt is alert throughout and cooperative. able to follow commands throughout.      Exercises      General Comments        Pertinent Vitals/Pain Pain Assessment: Faces Faces Pain Scale: Hurts even more Pain Location: R hip/thigh Pain Descriptors / Indicators: Aching;Sore Pain Intervention(s): Limited activity within patient's tolerance;Monitored during session;Premedicated before session;Repositioned    Home Living                      Prior Function            PT Goals (current goals can now be found in the care plan section) Progress towards PT goals: Progressing toward goals    Frequency    BID      PT Plan Current plan remains appropriate    Co-evaluation              AM-PAC PT "6 Clicks" Mobility   Outcome Measure  Help needed turning from your back to your side while in a flat bed without using bedrails?: A Little Help needed moving from lying on your back to sitting on the side of a flat bed without using bedrails?: A Little Help needed moving to and from a bed to a chair (including a wheelchair)?: A Little Help needed standing up from a chair using your arms (e.g., wheelchair or bedside chair)?: A Little Help needed to walk in hospital room?: A Little Help needed climbing 3-5 steps with a railing? : A Lot 6 Click Score: 17    End of Session Equipment Utilized During Treatment: Gait belt Activity Tolerance: Patient tolerated treatment well;Patient limited by pain Patient left: in bed;with call bell/phone within reach;with bed alarm set Nurse Communication: Mobility status;Precautions;Other (comment) Pain - Right/Left: Right Pain - part of body: Hip     Time: 1410-1504 PT Time Calculation (min) (ACUTE ONLY): 54 min  Charges:  $Gait Training: 23-37 mins $Therapeutic Activity: 23-37 mins                    Chesley Noon,  PTA 11/21/19, 3:19 PM

## 2019-11-21 NOTE — Plan of Care (Signed)
  Problem: Fluid Volume: Goal: Ability to maintain a balanced intake and output will improve Outcome: Progressing    Problem: Health Behavior/Discharge Planning: Goal: Ability to identify and utilize available resources and services will improve Outcome: Progressing Goal: Ability to manage health-related needs will improve Outcome: Progressing   Problem: Metabolic: Goal: Ability to maintain appropriate glucose levels will improve Outcome: Progressing   Problem: Nutritional: Goal: Maintenance of adequate nutrition will improve Outcome: Progressing   Problem: Skin Integrity: Goal: Risk for impaired skin integrity will decrease Outcome: Progressing   Problem: Health Behavior/Discharge Planning: Goal: Ability to manage health-related needs will improve Outcome: Progressing   Problem: Clinical Measurements: Goal: Ability to maintain clinical measurements within normal limits will improve Outcome: Progressing Goal: Will remain free from infection Outcome: Progressing Goal: Respiratory complications will improve Outcome: Progressing

## 2019-11-21 NOTE — Progress Notes (Signed)
PROGRESS NOTE    Jason Montes  BOF:[id number] DOB: 05-31-1951 DOA: 11/14/2019 PCP: [facility]   Assessment & Plan:   Principal Problem:   Intertrochanteric fracture of right femur, closed, initial encounter ([place]) Active Problems:   Atrial fibrillation ([place])   CAD (coronary artery disease)   DM (diabetes mellitus), type 2, uncontrolled with complications ([place])   Chronic diastolic heart failure (HCC)   Chronic anticoagulation   Essential hypertension   PVD (peripheral vascular disease) ([place])   Accidental fall   Preoperative clearance   Closed right hip fracture, initial encounter ([place])   Intertrochanteric fracture of right femur: secondary to accidental fall. With severe peripheral vascular disease s/p recent revascularization. S/p intramedullary fixation of right intertrochanteric hip fracture 11/16/19. PT/OT recs SNF   Generalized weakness: secondary to above. Continue to work w/ PT/OT inpatient as pt will unable to go to SNF. Improving slowly. Needs a bedside commode but pt's insurance already paid for one approx 1 month ago and now they are denying paying for another one   PAF: continue on carvedilol & eliquis  CAD: no chest pain. Continue on carvedilol, lisinopril  DM2: poorly controlled. HbA1c 13.8. Continue on lantus, SSI w/ accuchecks   Chronic systolic heart failure: appears euvolemic. Echo shows EF 77-82%, normal diastolic function. Continue carvedilol, torsemide,  lisinopril.   CKDII: baseline Cr is unknown. Cr is labile. Will continue to monitor   Hyponatremia: will continue gentle IVFs. Cr is trending down today. Will continue to monitor   Likely ACD: w/ a component of post op anemia. H&H are labile. Will continue to monitor   Essential hypertension: continue lisinopril, carvedilol   PVD: recently underwent revascularization left leg on 10/10/2019 by Dr. Lucky Cowboy due to nonhealing ulcer, still present. Hx of right transmetatarsal  amputation  Diabetic foot ulcer left foot: recommends outpatient f/u as per podiatry. Continue w/ wound care   Thrombocytopenia: etiology unclear, labile. Will continue to monitor   Constipation: continue on miralax, senokot, & colace. Had bowel movement 11/19/19   DVT prophylaxis: heparin  Code Status: full  Family Communication:  Disposition Plan: PT/OT recs SNF but is a registered sex offender so will be unable to go to SNF as per ortho surg. Continue to work w/ PT/OT inpatient. When stable for d/c pt will go home w/ home health    Consultants:   Ortho surg   Podiatry   Procedures:  S/p intramedullary fixation of right intertrochanteric hip fracture 11/16/19     Antimicrobials:    Subjective: Pt c/o weakness still w/ ambulating   Objective: Vitals:   11/20/19 2245 11/20/19 2345 11/21/19 0030 11/21/19 0736  BP: (!) 110/58 117/75 106/82 (!) 90/41  Pulse: 74 79 79 77  Resp: 15 16 16 16   Temp: 98.1 F (36.7 C) 98 F (36.7 C) 98 F (36.7 C) 98.8 F (37.1 C)  TempSrc: Oral Oral Oral Oral  SpO2: 97% 98% 94% 96%  Weight:        Intake/Output Summary (Last 24 hours) at 11/21/2019 0814 Last data filed at 11/21/2019 0600 Gross per 24 hour  Intake 1453.76 ml  Output 3300 ml  Net -1846.24 ml   Filed Weights   11/15/19 0100  Weight: 81.6 kg    Examination:  General exam: Appears calm and comfortable  Respiratory system: Clear to auscultation. No wheezes. Normal RR Cardiovascular system: S1/S2+. No  rubs, gallops or clicks.  Gastrointestinal system: Abdomen is nondistended, soft and nontender. Hypoactive bowel sounds heard. Central nervous system:  Alert and oriented. Moves all 4 extremities  Psychiatry: Judgement and insight appear normal. Flat mood and affect     Data Reviewed: I have personally reviewed following labs and imaging studies  CBC: Recent Labs  Lab 11/15/19 0036 11/16/19 0419 11/17/19 0601 11/18/19 0540 11/19/19 0613 11/20/19 0427  11/21/19 0433  WBC 6.3   < > 7.3 6.5 6.3 6.2 6.4  NEUTROABS 4.4  --   --   --   --   --   --   HGB 10.5*   < > 8.4* 8.3* 7.7* 7.0* 8.8*  HCT 34.0*   < > 26.8* 25.4* 24.5* 21.7* 25.9*  MCV 91.4   < > 92.7 88.2 92.1 88.6 86.6  PLT PLATELET CLUMPS NOTED ON SMEAR, UNABLE TO ESTIMATE   < > 103* 128* 150 166 126*   < > = values in this interval not displayed.   Basic Metabolic Panel: Recent Labs  Lab 11/17/19 0601 11/18/19 0540 11/19/19 0407 11/20/19 0427 11/21/19 0433  NA 133* 135 132* 130* 133*  K 4.2 4.4 5.1 4.5 4.3  CL 100 103 99 97* 100  CO2 24 28 23 24 26   GLUCOSE 215* 175* 251* 216* 211*  BUN 35* 39* 46* 47* 47*  CREATININE 1.55* 1.63* 1.64* 1.78* 1.65*  CALCIUM 8.6* 8.6* 8.8* 9.1 8.7*   GFR: Estimated Creatinine Clearance: 44 mL/min (A) (by C-G formula based on SCr of 1.65 mg/dL (H)). Liver Function Tests: No results for input(s): AST, ALT, ALKPHOS, BILITOT, PROT, ALBUMIN in the last 168 hours. No results for input(s): LIPASE, AMYLASE in the last 168 hours. No results for input(s): AMMONIA in the last 168 hours. Coagulation Profile: Recent Labs  Lab 11/15/19 0036 11/16/19 0419  INR 1.6* 1.5*   Cardiac Enzymes: No results for input(s): CKTOTAL, CKMB, CKMBINDEX, TROPONINI in the last 168 hours. BNP (last 3 results) No results for input(s): PROBNP in the last 8760 hours. HbA1C: No results for input(s): HGBA1C in the last 72 hours. CBG: Recent Labs  Lab 11/20/19 0758 11/20/19 1229 11/20/19 1547 11/20/19 2107 11/21/19 0736  GLUCAP 201* 242* 242* 302* 164*   Lipid Profile: No results for input(s): CHOL, HDL, LDLCALC, TRIG, CHOLHDL, LDLDIRECT in the last 72 hours. Thyroid Function Tests: No results for input(s): TSH, T4TOTAL, FREET4, T3FREE, THYROIDAB in the last 72 hours. Anemia Panel: No results for input(s): VITAMINB12, FOLATE, FERRITIN, TIBC, IRON, RETICCTPCT in the last 72 hours. Sepsis Labs: No results for input(s): PROCALCITON, LATICACIDVEN in the last  168 hours.  Recent Results (from the past 240 hour(s))  SARS Coronavirus 2 by RT PCR (hospital order, performed in [facility] hospital lab) Nasopharyngeal Nasopharyngeal Swab     Status: None   Collection Time: 11/15/19 12:36 AM   Specimen: Nasopharyngeal Swab  Result Value Ref Range Status   SARS Coronavirus 2 NEGATIVE NEGATIVE Final    Comment: (NOTE) SARS-CoV-2 target nucleic acids are NOT DETECTED.  The SARS-CoV-2 RNA is generally detectable in upper and lower respiratory specimens during the acute phase of infection. The lowest concentration of SARS-CoV-2 viral copies this assay can detect is 250 copies / mL. A negative result does not preclude SARS-CoV-2 infection and should not be used as the sole basis for treatment or other patient management decisions.  A negative result may occur with improper specimen collection / handling, submission of specimen other than nasopharyngeal swab, presence of viral mutation(s) within the areas targeted by this assay, and inadequate number of viral copies (<250 copies / mL).  A negative result must be combined with clinical observations, patient history, and epidemiological information.  Fact Sheet for Patients:   StrictlyIdeas.no  Fact Sheet for Healthcare Providers: BankingDealers.co.za  This test is not yet approved or  cleared by the [place] FDA and has been authorized for detection and/or diagnosis of SARS-CoV-2 by FDA under an Emergency Use Authorization (EUA).  This EUA will remain in effect (meaning this test can be used) for the duration of the COVID-19 declaration under Section 564(b)(1) of the Act, 21 U.S.C. section 360bbb-3(b)(1), unless the authorization is terminated or revoked sooner.  Performed at [facility], [address], [place], [place] [address]   Surgical pcr screen     Status: Abnormal   Collection Time: 11/15/19  4:05 AM   Specimen: Nasal Mucosa;  Nasal Swab  Result Value Ref Range Status   MRSA, PCR POSITIVE (A) NEGATIVE Final    Comment: RESULT CALLED TO, READ BACK BY AND VERIFIED WITH: Jamse Belfast RN [phone] 11/15/19 HNM    Staphylococcus aureus POSITIVE (A) NEGATIVE Final    Comment: (NOTE) The Xpert SA Assay (FDA approved for NASAL specimens in patients 60 years of age and older), is one component of a comprehensive surveillance program. It is not intended to diagnose infection nor to guide or monitor treatment. Performed at [facility], [address], [place],  [address]          Radiology Studies: No results found.      Scheduled Meds: . aspirin EC  81 mg Oral Daily  . atorvastatin  40 mg Oral QHS  . carvedilol  3.125 mg Oral BID WC  . docusate sodium  100 mg Oral BID  . famotidine  20 mg Oral Daily  . gabapentin  300 mg Oral BID  . insulin aspart  0-15 Units Subcutaneous TID WC  . insulin aspart  0-5 Units Subcutaneous QHS  . insulin glargine  5 Units Subcutaneous Daily  . lisinopril  2.5 mg Oral Daily  . pneumococcal 23 valent vaccine  0.5 mL Intramuscular Tomorrow-1000  . polyethylene glycol  17 g Oral BID  . polyethylene glycol-electrolytes  4,000 mL Oral Once  . Ensure Max Protein  11 oz Oral Daily  . senna  1 tablet Oral BID  . tamsulosin  0.4 mg Oral Daily  . torsemide  20 mg Oral Daily  . traMADol  50 mg Oral Q6H   Continuous Infusions: . sodium chloride 50 mL/hr at 11/21/19 0500     LOS: 6 days    Time spent: 33 mins     Wyvonnia Dusky, MD [facility] Pager [phone]  If 7PM-7AM, please contact night-coverage [email or web address] 11/21/2019, 8:14 AM

## 2019-11-21 NOTE — Progress Notes (Addendum)
Physical Therapy Treatment Patient Details Name: Jason Montes MRN: [id number] DOB: [birth year] Today's Date: 11/21/2019    History of Present Illness Per MD notes: Pt is a 69 y.o. 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 Pt diagnosed with R Intertrochanteric hip fx and is s/p IM fixation.    PT Comments    Pt pre-medicated 45 minutes prior to session.  Motivated to participate.  OOB on left and right side with rail and min guard.  Increased time given, poor control to return to supine.  Session focused on sit to stand and transfers from bed to recliner at bedside.  He is progressing and requires min a x 1 for general safety but no physical assist today.  He has poor control sitting in chair and emphasis given for hand placements and to improve control.  Transferred x 4.  Walked 20' to from commode to void.  Fatigued with session but he did put in 100% effort.  Discussed at length discharge plan and home management.  Pt has not choice but to discharge home to boarding house with limited support from [place] provided aide 2 hours 5 days a week.  He has yet to secure commode back from neighbor in house.  Accessing bathroom is not safe as walker does not fit in door and his plan to use counter for support is not safe or realistic for him.  Stated it is a 67 year old home with a very small bathroom and he is unable to leave commode there as it is a shared space.  He is resistant to rearranging his room at first but does seem more open to it as session progresses.  He stated his aide only does light housekeeping and shopping for him and is not "smart enough" to do much more.   Stated he has been through several aides in only a couple months.  Does not have any friends or family to assist in rearranging his room to make it more accessible for him.  Ideally pt will push bed against a wall to have a more open floor plan to allow for safe transfers to recliner/wheelchair/commode and access to his fridge. He is encouraged to try to make arrangements with his aide or family if able.  While gait is progressing, he still remains a high fall risk and the less time he is alone on his feet the safer for him.  A wheelchair for in room mobility will be by far the safest until gait improved but he remains somewhat resistant to it.  Explained to pt that recommendations were only that and an attempt to make his home as safe as possible for him.  He remains frustrated regarding discharge plan and his limitations and limited assist but choices and resources are limited for him and his overall poor living conditions at the boarding home.    Will continue to focus on functional mobility tasks and education to make discharge as safe as possible.  Will address stair training this pm as he does have steps with 1 rail to enter/exit his home.      Follow Up Recommendations  SNF     Equipment Recommendations  Wheelchair (measurements PT);Wheelchair cushion (measurements PT);3in1 (PT)  Recommendations for Other Services       Precautions / Restrictions Precautions Precautions: Fall Restrictions Weight Bearing Restrictions: Yes RLE Weight Bearing: Weight bearing as tolerated LLE Weight Bearing: Weight bearing as tolerated Other Position/Activity Restrictions: Pt with L diabetic foot ulcer, WBAT per Dr. Luana Shu vebal order 11/17/19 with a shoe donned, does not have to be a post-op shoe    Mobility  Bed Mobility Overal bed mobility: Needs Assistance Bed Mobility: Supine to Sit;Sit to Supine     Supine to sit: Supervision;Min guard Sit to supine: Supervision;Min guard   General bed  mobility comments: uses rail, left and right with poor control  transitioning to supine  Transfers Overall transfer level: Needs assistance Equipment used: Rolling walker (2 wheeled) Transfers: Sit to/from Stand Sit to Stand: Min guard         General transfer comment: extended time, uses bed for support on BLE's  Ambulation/Gait Ambulation/Gait assistance: Min assist;Min guard Gait Distance (Feet): 30 Feet Assistive device: Rolling walker (2 wheeled) Gait Pattern/deviations: Antalgic;Decreased stance time - right;Decreased step length - left Gait velocity: decreased   General Gait Details: focused on short ambulation distances in room and transfers vs longer distances of gait to focus on safe discharge plan   Stairs             Wheelchair Mobility    Modified Rankin (Stroke Patients Only)       Balance Overall balance assessment: Needs assistance Sitting-balance support: No upper extremity supported;Feet supported Sitting balance-Leahy Scale: Fair     Standing balance support: Bilateral upper extremity supported Standing balance-Leahy Scale: Poor Standing balance comment: requires BUE support at all times but no overt LOB today                            Cognition Arousal/Alertness: Awake/alert Behavior During Therapy: WFL for tasks assessed/performed Overall Cognitive Status: Within Functional Limits for tasks assessed                                 General Comments: Pt is alert throughout and cooperative. able to follow commands throughout.      Exercises Total Joint Exercises Ankle Circles/Pumps: AROM;Strengthening;Both;10 reps;Supine Quad Sets: AROM;Strengthening;Both;10 reps;Supine Gluteal Sets: Strengthening;Both;10 reps;Supine Heel Slides: AAROM;Right;AROM;Left;Strengthening;10 reps;Supine Hip ABduction/ADduction: AAROM;Right;10 reps Straight Leg Raises: AAROM;Right;10 reps;Supine Other Exercises Other Exercises:  extensive and and discussion regarding home planning and safety.    General Comments        Pertinent Vitals/Pain Pain Assessment: Faces Faces Pain Scale: Hurts even more Pain Location: R hip/thigh Pain Descriptors / Indicators: Aching;Sore Pain Intervention(s): Premedicated before session;Repositioned;Limited activity within patient's tolerance    Home Living                      Prior Function            PT Goals (current goals can now be found in the care plan section) Progress towards PT goals: Progressing toward goals    Frequency    BID      PT Plan Current plan remains appropriate    Co-evaluation              AM-PAC PT "6 Clicks" Mobility   Outcome Measure  Help needed turning from your back to your side while in a flat bed without using bedrails?: A Little Help needed moving from lying on  your back to sitting on the side of a flat bed without using bedrails?: A Little Help needed moving to and from a bed to a chair (including a wheelchair)?: A Little Help needed standing up from a chair using your arms (e.g., wheelchair or bedside chair)?: A Little Help needed to walk in hospital room?: A Little Help needed climbing 3-5 steps with a railing? : A Lot 6 Click Score: 17    End of Session Equipment Utilized During Treatment: Gait belt Activity Tolerance: Patient tolerated treatment well;Patient limited by pain Patient left: in chair;with call bell/phone within reach;with chair alarm set Nurse Communication: Mobility status;Precautions;Other (comment) Pain - Right/Left: Right Pain - part of body: Hip     Time: 7517-0017 PT Time Calculation (min) (ACUTE ONLY): 55 min  Charges:  $Gait Training: 8-22 mins $Therapeutic Exercise: 8-22 mins $Therapeutic Activity: 23-37 mins                    Chesley Noon, PTA 11/21/19, 11:22 AM

## 2019-11-21 NOTE — Progress Notes (Signed)
Occupational Therapy Treatment Patient Details Name: Jason Montes MRN: [id number] DOB: July 04, 1950 Today's Date: 11/21/2019    History of present illness Per MD notes: Pt is a 69 y.o. 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 Pt diagnosed with R Intertrochanteric hip fx and is s/p IM fixation.   OT comments  Jason Montes was seen for OT treatment on this date. Upon arrival to room pt reclined in bed reporting fatigue following PT sessions this date - agreeable to bed level tx session. Pt instructed in HEP, adapted dressing techniques, home/routines modifications, DME recs, d/c recs, fall prevention, and ECS. Requiring SUPERVISION toileting c urinal sitting EOB. MOD A for LBD sitting EOB. Pt verbalized understanding of instruction provided. Pt making good progress toward goals. Pt continues to benefit from skilled OT services to maximize return to PLOF and minimize risk of future falls, injury, caregiver burden, and readmission. Will continue to follow POC. Discharge recommendation remains appropriate.    Follow Up Recommendations  SNF;Home health OT;Supervision/Assistance - 24 hour    Equipment Recommendations   (defer to post acute)    Recommendations for Other Services      Precautions / Restrictions Precautions Precautions: Fall Restrictions Weight Bearing Restrictions: Yes RLE Weight Bearing: Weight bearing as tolerated LLE Weight Bearing: Weight bearing as tolerated Other Position/Activity Restrictions: Pt with L diabetic foot ulcer, WBAT per Dr. Luana Shu vebal order 11/17/19 with a shoe donned, does not have to be a post-op shoe       Mobility Bed  Mobility Overal bed mobility: Needs Assistance Bed Mobility: Supine to Sit;Sit to Supine     Supine to sit: Supervision;Min guard Sit to supine: Min guard;HOB elevated   General bed mobility comments: B rails  Transfers Overall transfer level: Needs assistance Equipment used: Rolling walker (2 wheeled) Transfers: Sit to/from Stand Sit to Stand: Min guard (min guard from inc height, mod a from lower wheelchair)         General transfer comment: Pt deferred OOB 2/2 pain     Balance Overall balance assessment: Needs assistance Sitting-balance support: No upper extremity supported;Feet supported Sitting balance-Leahy Scale: Good     Standing balance support: Bilateral upper extremity supported Standing balance-Leahy Scale: Poor Standing balance comment: requires BUE support at all times but no overt LOB today                           ADL either performed or assessed with clinical judgement   ADL Overall ADL's : Needs assistance/impaired                                       General ADL Comments: SUPERVISION toileting c urinal sitting EOB. MOD A LBD sitting EOB     Vision       Perception     Praxis      Cognition Arousal/Alertness: Awake/alert Behavior During Therapy: WFL for tasks assessed/performed Overall Cognitive Status: Within Functional Limits for tasks assessed  General Comments: Pt is alert throughout and cooperative. able to follow commands throughout.        Exercises Exercises: Other exercises Other Exercises Other Exercises: Pt educated re: HEP, adapted dressing techniques, home/routines modifications, DME recs, d/c recs, fall prevention, ECS Other Exercises: LBD, toileting, sup<>sit, bed mobility, sitting balance/tolerance, HEP    Shoulder Instructions       General Comments      Pertinent Vitals/ Pain       Pain Assessment: Faces Faces Pain Scale: Hurts even  more Pain Location: R hip/thigh Pain Descriptors / Indicators: Aching;Sore Pain Intervention(s): Limited activity within patient's tolerance;Repositioned  Home Living                                          Prior Functioning/Environment              Frequency  Min 2X/week        Progress Toward Goals  OT Goals(current goals can now be found in the care plan section)  Progress towards OT goals: Progressing toward goals  Acute Rehab OT Goals Patient Stated Goal: To walk better OT Goal Formulation: With patient Time For Goal Achievement: 12/01/19 Potential to Achieve Goals: Good ADL Goals Pt Will Perform Lower Body Dressing: with min assist;with adaptive equipment;sit to/from stand (with LRAD) Pt Will Transfer to Toilet: with min guard assist;ambulating;regular height toilet (with LRAD) Additional ADL Goal #1: Pt will independently verbalize x3 fall prevention strategies to promote safety and independence in ADLs.  Plan Discharge plan remains appropriate;Frequency remains appropriate    Co-evaluation                 AM-PAC OT "6 Clicks" Daily Activity     Outcome Measure   Help from another person eating meals?: None Help from another person taking care of personal grooming?: None Help from another person toileting, which includes using toliet, bedpan, or urinal?: A Lot Help from another person bathing (including washing, rinsing, drying)?: A Lot Help from another person to put on and taking off regular upper body clothing?: None Help from another person to put on and taking off regular lower body clothing?: A Lot 6 Click Score: 18    End of Session    OT Visit Diagnosis: Other abnormalities of gait and mobility (R26.89);Pain Pain - Right/Left: Right Pain - part of body: Hip   Activity Tolerance Patient tolerated treatment well   Patient Left in bed;with call bell/phone within reach;with bed alarm set   Nurse Communication           Time: 0923-3007 OT Time Calculation (min): 31 min  Charges: OT General Charges $OT Visit: 1 Visit OT Treatments $Self Care/Home Management : 23-37 mins  Dessie Coma, M.S. OTR/L  11/21/19, 4:46 PM

## 2019-11-22 LAB — BASIC METABOLIC PANEL
Anion gap: 10 (ref 5–15)
BUN: 40 mg/dL — ABNORMAL HIGH (ref 8–23)
CO2: 25 mmol/L (ref 22–32)
Calcium: 8.8 mg/dL — ABNORMAL LOW (ref 8.9–10.3)
Chloride: 98 mmol/L (ref 98–111)
Creatinine, Ser: 1.44 mg/dL — ABNORMAL HIGH (ref 0.61–1.24)
GFR calc Af Amer: 58 mL/min — ABNORMAL LOW (ref >60)
GFR calc non Af Amer: 50 mL/min — ABNORMAL LOW (ref >60)
Glucose, Bld: 199 mg/dL — ABNORMAL HIGH (ref 70–99)
Potassium: 4.1 mmol/L (ref 3.5–5.1)
Sodium: 133 mmol/L — ABNORMAL LOW (ref 135–145)

## 2019-11-22 LAB — CBC
HCT: 26.9 % — ABNORMAL LOW (ref 39.0–52.0)
Hemoglobin: 9 g/dL — ABNORMAL LOW (ref 13.0–17.0)
MCH: 29.5 pg (ref 26.0–34.0)
MCHC: 33.5 g/dL (ref 30.0–36.0)
MCV: 88.2 fL (ref 80.0–100.0)
Platelets: 191 10*3/uL (ref 150–400)
RBC: 3.05 MIL/uL — ABNORMAL LOW (ref 4.22–5.81)
RDW: 14.5 % (ref 11.5–15.5)
WBC: 6.6 10*3/uL (ref 4.0–10.5)
nRBC: 0 % (ref 0.0–0.2)

## 2019-11-22 LAB — GLUCOSE, CAPILLARY
Glucose-Capillary: 187 mg/dL — ABNORMAL HIGH (ref 70–99)
Glucose-Capillary: 278 mg/dL — ABNORMAL HIGH (ref 70–99)
Glucose-Capillary: 263 mg/dL — ABNORMAL HIGH (ref 70–99)
Glucose-Capillary: 181 mg/dL — ABNORMAL HIGH (ref 70–99)

## 2019-11-22 MED ORDER — ZINC OXIDE 40 % EX OINT
TOPICAL_OINTMENT | Freq: Two times a day (BID) | CUTANEOUS | Status: DC
  Filled 2019-11-22: qty 113

## 2019-11-22 MED ORDER — GLUCERNA SHAKE PO LIQD
237.0000 mL | Freq: Two times a day (BID) | ORAL | Status: DC
  Administered 2019-11-22 – 2019-11-24 (×4): 237 mL via ORAL

## 2019-11-22 NOTE — Progress Notes (Signed)
   11/22/19 1000  Clinical Encounter Type  Visited With Patient  Visit Type Initial;Spiritual support;Social support  Referral From Nurse  Consult/Referral To Chaplain  Responded to page from nurse in 2A. Ch visited with Pt in his room. Pt told Ch he had broken his hip. Pt was concern he was going to be released and because of his hip he could not walk. Because of Pt situation in life, the only place he could go to is home. Pt wants to go to a rehab. Social worker is work with Pt. I prayed with Pt. Ch will follow-up with Pt.

## 2019-11-22 NOTE — Progress Notes (Cosign Needed)
atient suffers from amputation and weakness which impairs their ability to perform daily activities like ADLs in the home.  A walking aid will not resolve  issue with performing activities of daily living. A wheelchair will allow patient to safely perform daily activities. Patient is not able to propel themselves in the home using a standard weight wheelchair due to weakness. Patient can self propel in the lightweight wheelchair. Length of need 99 months Accessories: elevating leg rests Back cushion, wheel locks, extensions and anti-tippers.

## 2019-11-22 NOTE — Progress Notes (Signed)
Physical Therapy Treatment Patient Details Name: Jason Montes MRN: [id number] DOB: 08-21-1950 Today's Date: 11/22/2019    History of Present Illness Per MD notes: Pt is a 69 y.o. 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 Pt diagnosed with R Intertrochanteric hip fx and is s/p IM fixation.    PT Comments    Pt up walking with tech to bathroom upon arrival.  He is able to sit with difficulty on regular height commode per his choice in bathroom.  Requires mod a x 1 to stand from commode despite pulling on rails and walker.  Do not advise pt use bathroom in boarding house and instead use commode as he is unlikely to be able to get back up if he tries it.  Discussed with pt.  He is able to progress gait 80' in hallway with slow step to gait pattern.  While he does not have any overt LOB's today, he remains at an increased fall risk.  Returns to bed with supervision.  Pt asking about discharge plan and his safety going home.  Discussed again at length that best case would be SNF or home with family but since those options are not open to him the best and only option is for him to return to boarding house.  With recommendations and adaptions that have been discussed and arranged it is the safest environment that we can make for him.  He is able to complete standing and tranfers from higher surfaces and short distances with min guard/supervision.  Encouraged him to limit the time on his feet and to take advantage of the times that he does have assist to walk in hallways and longer walks to progress his gait.  He voiced understanding.  He has not have any overt LOB's in several  days that require assistance.  Encouraged him to take pain medications as needed/perscribed so pain is not a significant limiting factor for mobility.  Again voiced understanding.     Follow Up Recommendations  SNF - unavailable to him.  Will benefit from any and all [facility] services available to him.     Equipment Recommendations  Wheelchair (measurements PT);Wheelchair cushion (measurements PT);3in1 (PT)    Recommendations for Other Services       Precautions / Restrictions Precautions Precautions: Fall Restrictions Weight Bearing Restrictions: Yes RLE Weight Bearing: Weight bearing as tolerated LLE Weight Bearing: Weight bearing as tolerated Other Position/Activity Restrictions: Pt with L diabetic foot ulcer, WBAT per Dr. Luana Shu vebal order 11/17/19 with a shoe donned, does not have to be a post-op shoe    Mobility  Bed Mobility Overal bed mobility: Needs Assistance Bed Mobility: Supine to Sit;Sit to Supine     Supine to sit: Supervision Sit to supine: Supervision      Transfers Overall transfer level: Needs assistance Equipment used: Rolling walker (2 wheeled) Transfers: Sit to/from Stand Sit to Stand: Min guard         General transfer comment: min guard from higher surfaces.  difficulty off stand height commode requries mod a x 1.  Ambulation/Gait Ambulation/Gait assistance: Min assist;Min guard Gait Distance (Feet): 80 Feet Assistive device: Rolling walker (2 wheeled) Gait Pattern/deviations: Antalgic;Decreased stance time - right;Decreased step length - left  Gait velocity: decreased   General Gait Details: focused on sideways gait as he plans to walk sideway into bathroom and along bed if needed to access room.  limted by pain   Stairs             Wheelchair Mobility    Modified Rankin (Stroke Patients Only)       Balance Overall balance assessment: Needs assistance Sitting-balance support: No upper extremity supported;Feet supported Sitting  balance-Leahy Scale: Good     Standing balance support: Bilateral upper extremity supported Standing balance-Leahy Scale: Poor Standing balance comment: requires BUE support at all times but no overt LOB today                            Cognition Arousal/Alertness: Awake/alert Behavior During Therapy: WFL for tasks assessed/performed Overall Cognitive Status: Within Functional Limits for tasks assessed                                 General Comments: Pt is alert throughout and cooperative. able to follow commands throughout.      Exercises Total Joint Exercises Ankle Circles/Pumps: AROM;Strengthening;Both;10 reps;Supine Quad Sets: AROM;Strengthening;Both;10 reps;Supine Gluteal Sets: Strengthening;Both;10 reps;Supine Heel Slides: AAROM;Right;AROM;Left;Strengthening;10 reps;Supine Hip ABduction/ADduction: AAROM;Right;10 reps Straight Leg Raises: AAROM;Right;10 reps;Supine    General Comments        Pertinent Vitals/Pain Pain Assessment: Faces Faces Pain Scale: Hurts little more Pain Location: R hip/thigh - more comfortable this pm Pain Descriptors / Indicators: Aching;Sore Pain Intervention(s): Premedicated before session;Repositioned;Monitored during session;Limited activity within patient's tolerance    Home Living                      Prior Function            PT Goals (current goals can now be found in the care plan section) Progress towards PT goals: Progressing toward goals    Frequency    BID      PT Plan Current plan remains appropriate    Co-evaluation              AM-PAC PT "6 Clicks" Mobility   Outcome Measure  Help needed turning from your back to your side while in a flat bed without using bedrails?: A Little Help needed moving from lying on your back to sitting on the side of a flat bed without using bedrails?: A Little Help needed moving to and from a bed to a chair (including a wheelchair)?: A  Little Help needed standing up from a chair using your arms (e.g., wheelchair or bedside chair)?: A Little Help needed to walk in hospital room?: A Little Help needed climbing 3-5 steps with a railing? : A Lot 6 Click Score: 17    End of Session Equipment Utilized During Treatment: Gait belt Activity Tolerance: Patient limited by pain Patient left: in bed;with call bell/phone within reach;with bed alarm set Nurse Communication: Mobility status;Precautions;Other (comment) Pain - Right/Left: Right Pain - part of body: Hip     Time: 4680-3212 PT Time Calculation (min) (ACUTE ONLY): 34 min  Charges:  $Gait Training: 23-37 mins $Therapeutic Exercise: 8-22 mins $Therapeutic Activity: 8-22 mins                    Chesley Noon, PTA 11/22/19, 3:25 PM

## 2019-11-22 NOTE — TOC Progression Note (Addendum)
Transition of Care [facility]) - Progression Note    Patient Details  Name: LUI BELLIS MRN: [id number] Date of Birth: 01-04-1951  Transition of Care [facility]) CM/SW [place], RN Phone Number: 11/22/2019, 10:00 AM  Clinical Narrative:    The patient lives in a boarding house at [facility], he needs EMS transport home, he confirms that he will be returning to the boarding house, he states he needs a wheelchair and a 3 in 1, he gave away the 3 in 1 that we provided a month ago. I am working with the charity foundation to get a new 3 in 1,Zack with [employer] about getting a wheelchair, they will deliver to the patient's boarding house, I called the patient's daughter Healthier and she asked that we do not call her at all, I removed her name from the chart, she wants nothing to do with the patient I called [facility] and they agree to transport the patient's 3 in 1 along with the patient as long as a staff member will walk it to the ambulance so that the team can have both hands on the strecher       Expected Discharge Plan and Services                                                 Social Determinants of Health (SDOH) Interventions    Readmission Risk Interventions No flowsheet data found.

## 2019-11-22 NOTE — Progress Notes (Signed)
Nutrition Follow-up  DOCUMENTATION CODES:   Not applicable  INTERVENTION:  Continue Ensure Max Protein po once daily, each supplement provides 150 kcal and 30 grams of protein.  Provide Glucerna Shake po BID, each supplement provides 220 kcal and 10 grams of protein.  NUTRITION DIAGNOSIS:   Increased nutrient needs related to wound healing, hip fracture, post-op healing as evidenced by estimated needs.  Ongoing.  GOAL:   Patient will meet greater than or equal to 90% of their needs  Progressing.  MONITOR:   PO intake, Supplement acceptance, Labs, Weight trends, Skin, I & O's  REASON FOR ASSESSMENT:   Consult Assessment of nutrition requirement/status  ASSESSMENT:   69 year old male with PMHx of DM with nephropathy and polyneuropathy, HTN, HLD, CKD, hx CVA, A-fib, CKD, GERD, hx right foot transmetatarsal amputation, CAD s/p CABG, nonhealing left diabetic foot ulcer, recent revascularization of left tibial, peroneal, and popliteal arteries on 5/10 who is now admitted after mechanical fall with intertrochanteric fracture of right femur.   -Patient s/p IM fixation of right intertrochanteric hip fracture on 6/16.  Met with patient at bedside. He reports his appetite has been decreased the last couple days but is unsure why. He was eating 100% of his meals but is now only eating 50-75%. He is drinking Ensure Max 1x/day. Over the past 24 hours patient has had approximately 1614 kcal (77% minimum estimated needs) and 92 grams of protein (88% minimum estimated needs). Discussed increased nutrient needs. Patient is amenable to drinking two bottles of Glucerna daily in addition to the Ensure Max 1/day to help meet calorie/protein needs.   Medications reviewed and include: Colace 100 mg BID, famotidine, Novolog 0-15 units TID, Novolog 0-5 units QHS, lisinopril, Miralax, senna 1 tablet BID, NS at 50 mL/hr.  Labs reviewed: CBG 187-278, Sodium 133, BUN 40, Creatinine 1.44.  Diet Order:    Diet Order            Diet heart healthy/carb modified Room service appropriate? Yes; Fluid consistency: Thin  Diet effective now                EDUCATION NEEDS:   No education needs have been identified at this time  Skin:  Skin Assessment: Skin Integrity Issues: Skin Integrity Issues:: Incisions, Other (Comment) Incisions: closed incision right hip Other: left great toe ischemic wound  3 cm x 2.2 cm  Last BM:  11/22/2019 - small type 1  Height:   Ht Readings from Last 1 Encounters:  10/07/19 '5\' 9"'$  (1.753 m)   Weight:   Wt Readings from Last 1 Encounters:  11/15/19 81.6 kg   BMI:  Body mass index is 26.57 kg/m.  Estimated Nutritional Needs:   Kcal:  2100-2300  Protein:  105-115 grams  Fluid:  2 L/day  Jacklynn Barnacle, MS, RD, LDN Pager number available on Amion

## 2019-11-22 NOTE — Progress Notes (Signed)
Physical Therapy Treatment Patient Details Name: Jason Montes MRN: [id number] DOB: 1951-04-02 Today's Date: 11/22/2019    History of Present Illness Per MD notes: Pt is a 69 y.o. 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 Pt diagnosed with R Intertrochanteric hip fx and is s/p IM fixation.    PT Comments    Pt in bed, ready for session.  Had pain medication 2 hours prior.  Participated in exercises as described below. He is able to transition to sitting with rail and increased time.  Multiple attempts to stand but is able to stand with RW and min guard x 1.  He is able to transfer to chair with slow unsteady gait but no LOB's that require intervention.  After short rest, he is able to stand and practice walking sideways left and right for short distances to simulate tasks he may do at home despite recommendations to avoid but to fall risk.  He is limited by pain today and LPN is called to give more pain medication.  Will return later this pm for continued session.   Follow Up Recommendations  SNF     Equipment Recommendations  Wheelchair (measurements PT);Wheelchair cushion (measurements PT);3in1 (PT)    Recommendations for Other Services       Precautions / Restrictions Precautions Precautions: Fall Restrictions Weight Bearing Restrictions: Yes LUE Weight Bearing: Weight bearing as tolerated RLE Weight Bearing: Weight bearing as tolerated LLE Weight Bearing: Weight bearing as tolerated Other Position/Activity Restrictions: Pt with L diabetic foot ulcer, WBAT per Dr. Luana Shu vebal order 11/17/19 with a shoe donned, does not have to be a post-op shoe     Mobility  Bed Mobility Overal bed mobility: Needs Assistance Bed Mobility: Supine to Sit     Supine to sit: Supervision;Min guard        Transfers Overall transfer level: Needs assistance Equipment used: Rolling walker (2 wheeled) Transfers: Sit to/from Stand Sit to Stand: Min guard         General transfer comment: increase attempts this am but with time able to do with min guard  Ambulation/Gait Ambulation/Gait assistance: Min assist;Min guard Gait Distance (Feet): 10 Feet Assistive device: Rolling walker (2 wheeled) Gait Pattern/deviations: Antalgic;Decreased stance time - right;Decreased step length - left Gait velocity: decreased   General Gait Details: focused on sideways gait as he plans to walk sideway into bathroom and along bed if needed to access room.  limted by pain   Stairs             Wheelchair Mobility    Modified Rankin (Stroke Patients Only)       Balance Overall balance assessment: Needs assistance Sitting-balance support: No upper extremity supported;Feet supported Sitting balance-Leahy Scale: Good     Standing balance support: Bilateral upper extremity supported Standing balance-Leahy Scale: Poor Standing balance comment: requires BUE support at all times but no overt LOB today                            Cognition Arousal/Alertness: Awake/alert Behavior During Therapy: WFL for tasks assessed/performed Overall Cognitive Status: Within Functional Limits for tasks assessed  Exercises Total Joint Exercises Ankle Circles/Pumps: AROM;Strengthening;Both;10 reps;Supine Quad Sets: AROM;Strengthening;Both;10 reps;Supine Gluteal Sets: Strengthening;Both;10 reps;Supine Heel Slides: AAROM;Right;AROM;Left;Strengthening;10 reps;Supine Hip ABduction/ADduction: AAROM;Right;10 reps Straight Leg Raises: AAROM;Right;10 reps;Supine    General Comments        Pertinent  Vitals/Pain Pain Assessment: Faces Faces Pain Scale: Hurts whole lot Pain Location: R hip/thigh Pain Descriptors / Indicators: Aching;Sore Pain Intervention(s): Limited activity within patient's tolerance;Monitored during session;Premedicated before session;Patient requesting pain meds-RN notified;Repositioned    Home Living                      Prior Function            PT Goals (current goals can now be found in the care plan section) Progress towards PT goals: Progressing toward goals    Frequency    BID      PT Plan Current plan remains appropriate    Co-evaluation              AM-PAC PT "6 Clicks" Mobility   Outcome Measure  Help needed turning from your back to your side while in a flat bed without using bedrails?: A Little Help needed moving from lying on your back to sitting on the side of a flat bed without using bedrails?: A Little Help needed moving to and from a bed to a chair (including a wheelchair)?: A Little Help needed standing up from a chair using your arms (e.g., wheelchair or bedside chair)?: A Little Help needed to walk in hospital room?: A Little Help needed climbing 3-5 steps with a railing? : A Lot 6 Click Score: 17    End of Session Equipment Utilized During Treatment: Gait belt Activity Tolerance: Patient limited by pain Patient left: in chair;with call bell/phone within reach;with chair alarm set;with nursing/sitter in room Nurse Communication: Mobility status;Precautions;Other (comment) Pain - Right/Left: Right Pain - part of body: Hip     Time: 2641-5830 PT Time Calculation (min) (ACUTE ONLY): 39 min  Charges:  $Gait Training: 8-22 mins $Therapeutic Exercise: 8-22 mins $Therapeutic Activity: 8-22 mins                    Chesley Noon, PTA 11/22/19, 1:03 PM

## 2019-11-22 NOTE — TOC Progression Note (Signed)
Transition of Care [facility]) - Progression Note    Patient Details  Name: ARIK HUSMANN MRN: [id number] Date of Birth: 12/05/1950  Transition of Care [facility]) CM/SW [place], RN Phone Number: 11/22/2019, 10:50 AM  Clinical Narrative:    The patient's daughter Nira Conn called back, She stated that the patient does in fact rent a room at a boarding house, he gets meals on wheels daily, he has an aide that comes daily for 2.5 hours a day.  I explained I set the patient up for [facility] services, and a wheelchair and a new Bedside commode due to the patient stating that he gave away the previous 3 in 1, She stated that the 3 in 1 is in his room and that he will starve I asked her top clarify that he did get Meals on wheels and she stated that yes he does, I also reiterated that he has an aide daily for 2.5 hours and HH will be coming, I also set him up with a Wheelchair and a 3 in 1, She said he needs an aide all day every day so he will not starve, I explained that I spoke with the patient about this and he will have to call and get additional hours approved , she stated that she felt that we were sending him home to starve< I explained that with a wheelchair and an aide daily as well as Meals on wheels he should be able to get his meals, I explained that due to him being on the sexual predator registry as well as being on parole and on house arrest I would not be able to get him into a SNF facility, I asked her if she would be able to help and she stated that she wanted nothing to do with him, She said she would call Social Services to see what they can do.        Expected Discharge Plan and Services                                                 Social Determinants of Health (SDOH) Interventions    Readmission Risk Interventions No flowsheet data found.

## 2019-11-22 NOTE — Progress Notes (Signed)
Subjective: 6 Days Post-Op Procedure(s) (LRB): INTRAMEDULLARY (IM) NAIL INTERTROCHANTRIC (Right)   Alert and cooperative.  Has been out of bed and walk some.  Still little weak.  Hemoglobin 9.0.  Dressings are dry.  Plan is to go home with home health PT.  Patient reports pain as mild.  Objective:   VITALS:   Vitals:   11/21/19 2315 11/22/19 0818  BP: 118/67 134/66  Pulse: 81 87  Resp: 15 18  Temp: 98.2 F (36.8 C) 97.8 F (36.6 C)  SpO2: 98% 99%    Neurologically intact Neurovascular intact Sensation intact distally Dorsiflexion/Plantar flexion intact Incision: dressing C/D/I  LABS Recent Labs    11/20/19 0427 11/21/19 0433 11/22/19 0528  HGB 7.0* 8.8* 9.0*  HCT 21.7* 25.9* 26.9*  WBC 6.2 6.4 6.6  PLT 166 126* 191    Recent Labs    11/20/19 0427 11/21/19 0433 11/22/19 0528  NA 130* 133* 133*  K 4.5 4.3 4.1  BUN 47* 47* 40*  CREATININE 1.78* 1.65* 1.44*  GLUCOSE 216* 211* 199*    No results for input(s): LABPT, INR in the last 72 hours.   Assessment/Plan: 6 Days Post-Op Procedure(s) (LRB): INTRAMEDULLARY (IM) NAIL INTERTROCHANTRIC (Right)   Advance diet Up with therapy D/C IV fluids Discharge home with home health   Resume Eliquis at home  Patient may weight-bear as tolerated with walker.  Follow-up with Dr. Mack Guise in 2 weeks at our office

## 2019-11-22 NOTE — Progress Notes (Signed)
PROGRESS NOTE    Jason Montes  IHK:[id number] DOB: [birth year] DOA: 11/14/2019 PCP: [facility]   Assessment & Plan:   Principal Problem:   Intertrochanteric fracture of right femur, closed, initial encounter ([place]) Active Problems:   Atrial fibrillation ([place])   CAD (coronary artery disease)   DM (diabetes mellitus), type 2, uncontrolled with complications ([place])   Chronic diastolic heart failure (H[facility])   Chronic anticoagulation   Essential hypertension   PVD (peripheral vascular disease) ([place])   Accidental fall   Preoperative clearance   Closed right hip fracture, initial encounter ([place])   Intertrochanteric fracture of right femur: secondary to accidental fall. With severe peripheral vascular disease s/p recent revascularization. S/p intramedullary fixation of right intertrochanteric hip fracture 11/16/19. PT/OT recs SNF   Generalized weakness: secondary to above. Continue to work w/ PT/OT inpatient as pt will unable to go to SNF. Improving slowly. Needs a bedside commode but pt's insurance already paid for one approx 1 month ago and now they are denying paying for another one but CM working to get the pt another bedside commode   PAF: continue on carvedilol & eliquis  CAD: no chest pain. Continue on carvedilol, lisinopril  DM2: poorly controlled. HbA1c 13.8. Continue on lantus, SSI w/ accuchecks   Chronic systolic heart failure: appears euvolemic. Echo shows EF 75-64%, normal diastolic function. Continue carvedilol, torsemide,  lisinopril.   CKDII: baseline Cr is unknown. Cr is labile. Will continue to monitor   Hyponatremia: will continue gentle IVFs. Will continue to monitor   Likely ACD: w/ a component of post op anemia. H&H are labile. Will continue to monitor   Essential hypertension: continue lisinopril, carvedilol   PVD: recently underwent revascularization left leg on 10/10/2019 by Dr. Lucky Cowboy due to nonhealing ulcer, still present. Hx of right  transmetatarsal amputation  Diabetic foot ulcer left foot: recommends outpatient f/u as per podiatry. Continue w/ wound care   Thrombocytopenia: resolved  Constipation: continue on miralax, senokot, & colace. Had bowel movement 11/19/19   DVT prophylaxis: heparin  Code Status: full  Family Communication:  Disposition Plan: PT/OT recs SNF but is a registered sex offender so will be unable to go to SNF as per ortho surg. Continue to work w/ PT/OT inpatient & want to make sure pt can go from bed/chair to bathroom w/o falling. When stable for d/c pt will go home w/ home health (which has already been set up)   Consultants:   Ortho surg   Podiatry   Procedures:  S/p intramedullary fixation of right intertrochanteric hip fracture 11/16/19     Antimicrobials:    Subjective: Pt is worried about going home and not being able to ambulate independently.   Objective: Vitals:   11/21/19 1116 11/21/19 1531 11/21/19 2315 11/22/19 0818  BP: (!) 97/50 (!) 110/58 118/67 134/66  Pulse: 79 78 81 87  Resp:  17 15 18   Temp:  98.1 F (36.7 C) 98.2 F (36.8 C) 97.8 F (36.6 C)  TempSrc:  Oral Oral Oral  SpO2:  97% 98% 99%  Weight:        Intake/Output Summary (Last 24 hours) at 11/22/2019 1403 Last data filed at 11/22/2019 1111 Gross per 24 hour  Intake --  Output 2500 ml  Net -2500 ml   Filed Weights   11/15/19 0100  Weight: 81.6 kg    Examination:  General exam: Appears calm and comfortable  Respiratory system: Clear to auscultation. No rales, rhonchi. Normal RR Cardiovascular system: S1/S2+. No  rubs, gallops or clicks.  Gastrointestinal system: Abdomen is nondistended, soft and nontender. Normal bowel sounds heard. Central nervous system: Alert and oriented. Moves all 4 extremities  Psychiatry: Judgement and insight appear normal. Flat mood and affect     Data Reviewed: I have personally reviewed following labs and imaging studies  CBC: Recent Labs  Lab  11/18/19 0540 11/19/19 0613 11/20/19 0427 11/21/19 0433 11/22/19 0528  WBC 6.5 6.3 6.2 6.4 6.6  HGB 8.3* 7.7* 7.0* 8.8* 9.0*  HCT 25.4* 24.5* 21.7* 25.9* 26.9*  MCV 88.2 92.1 88.6 86.6 88.2  PLT 128* 150 166 126* 875   Basic Metabolic Panel: Recent Labs  Lab 11/18/19 0540 11/19/19 0407 11/20/19 0427 11/21/19 0433 11/22/19 0528  NA 135 132* 130* 133* 133*  K 4.4 5.1 4.5 4.3 4.1  CL 103 99 97* 100 98  CO2 28 23 24 26 25   GLUCOSE 175* 251* 216* 211* 199*  BUN 39* 46* 47* 47* 40*  CREATININE 1.63* 1.64* 1.78* 1.65* 1.44*  CALCIUM 8.6* 8.8* 9.1 8.7* 8.8*   GFR: Estimated Creatinine Clearance: 50.5 mL/min (A) (by C-G formula based on SCr of 1.44 mg/dL (H)). Liver Function Tests: No results for input(s): AST, ALT, ALKPHOS, BILITOT, PROT, ALBUMIN in the last 168 hours. No results for input(s): LIPASE, AMYLASE in the last 168 hours. No results for input(s): AMMONIA in the last 168 hours. Coagulation Profile: Recent Labs  Lab 11/16/19 0419  INR 1.5*   Cardiac Enzymes: No results for input(s): CKTOTAL, CKMB, CKMBINDEX, TROPONINI in the last 168 hours. BNP (last 3 results) No results for input(s): PROBNP in the last 8760 hours. HbA1C: No results for input(s): HGBA1C in the last 72 hours. CBG: Recent Labs  Lab 11/21/19 1159 11/21/19 1647 11/21/19 2127 11/22/19 0742 11/22/19 1209  GLUCAP 229* 201* 203* 187* 278*   Lipid Profile: No results for input(s): CHOL, HDL, LDLCALC, TRIG, CHOLHDL, LDLDIRECT in the last 72 hours. Thyroid Function Tests: No results for input(s): TSH, T4TOTAL, FREET4, T3FREE, THYROIDAB in the last 72 hours. Anemia Panel: No results for input(s): VITAMINB12, FOLATE, FERRITIN, TIBC, IRON, RETICCTPCT in the last 72 hours. Sepsis Labs: No results for input(s): PROCALCITON, LATICACIDVEN in the last 168 hours.  Recent Results (from the past 240 hour(s))  SARS Coronavirus 2 by RT PCR (hospital order, performed in [facility] hospital lab)  Nasopharyngeal Nasopharyngeal Swab     Status: None   Collection Time: 11/15/19 12:36 AM   Specimen: Nasopharyngeal Swab  Result Value Ref Range Status   SARS Coronavirus 2 NEGATIVE NEGATIVE Final    Comment: (NOTE) SARS-CoV-2 target nucleic acids are NOT DETECTED.  The SARS-CoV-2 RNA is generally detectable in upper and lower respiratory specimens during the acute phase of infection. The lowest concentration of SARS-CoV-2 viral copies this assay can detect is 250 copies / mL. A negative result does not preclude SARS-CoV-2 infection and should not be used as the sole basis for treatment or other patient management decisions.  A negative result may occur with improper specimen collection / handling, submission of specimen other than nasopharyngeal swab, presence of viral mutation(s) within the areas targeted by this assay, and inadequate number of viral copies (<250 copies / mL). A negative result must be combined with clinical observations, patient history, and epidemiological information.  Fact Sheet for Patients:   StrictlyIdeas.no  Fact Sheet for Healthcare Providers: BankingDealers.co.za  This test is not yet approved or  cleared by the [place] FDA and has been authorized for detection and/or  diagnosis of SARS-CoV-2 by FDA under an Emergency Use Authorization (EUA).  This EUA will remain in effect (meaning this test can be used) for the duration of the COVID-19 declaration under Section 564(b)(1) of the Act, 21 U.S.C. section 360bbb-3(b)(1), unless the authorization is terminated or revoked sooner.  Performed at [facility], [address], [place], [place] [address]   Surgical pcr screen     Status: Abnormal   Collection Time: 11/15/19  4:05 AM   Specimen: Nasal Mucosa; Nasal Swab  Result Value Ref Range Status   MRSA, PCR POSITIVE (A) NEGATIVE Final    Comment: RESULT CALLED TO, READ BACK BY AND VERIFIED  WITH: Jamse Belfast RN [phone] 11/15/19 HNM    Staphylococcus aureus POSITIVE (A) NEGATIVE Final    Comment: (NOTE) The Xpert SA Assay (FDA approved for NASAL specimens in patients 57 years of age and older), is one component of a comprehensive surveillance program. It is not intended to diagnose infection nor to guide or monitor treatment. Performed at [facility], [address], [place], [place] [address]          Radiology Studies: No results found.      Scheduled Meds:  aspirin EC  81 mg Oral Daily   atorvastatin  40 mg Oral QHS   carvedilol  3.125 mg Oral BID WC   docusate sodium  100 mg Oral BID   famotidine  20 mg Oral Daily   gabapentin  300 mg Oral BID   insulin aspart  0-15 Units Subcutaneous TID WC   insulin aspart  0-5 Units Subcutaneous QHS   insulin glargine  5 Units Subcutaneous Daily   lisinopril  2.5 mg Oral Daily   liver oil-zinc oxide   Topical BID   pneumococcal 23 valent vaccine  0.5 mL Intramuscular Tomorrow-1000   polyethylene glycol  17 g Oral BID   polyethylene glycol-electrolytes  4,000 mL Oral Once   Ensure Max Protein  11 oz Oral Daily   senna  1 tablet Oral BID   tamsulosin  0.4 mg Oral Daily   torsemide  20 mg Oral Daily   traMADol  50 mg Oral Q6H   Continuous Infusions:  sodium chloride 50 mL/hr at 11/22/19 0308     LOS: 7 days    Time spent: 35 mins     Wyvonnia Dusky, MD [facility] Pager [phone]  If 7PM-7AM, please contact night-coverage [email or web address] 11/22/2019, 2:03 PM

## 2019-11-23 DIAGNOSIS — E1165 Type 2 diabetes mellitus with hyperglycemia: Secondary | ICD-10-CM

## 2019-11-23 DIAGNOSIS — E118 Type 2 diabetes mellitus with unspecified complications: Secondary | ICD-10-CM

## 2019-11-23 DIAGNOSIS — I25118 Atherosclerotic heart disease of native coronary artery with other forms of angina pectoris: Secondary | ICD-10-CM

## 2019-11-23 DIAGNOSIS — I5032 Chronic diastolic (congestive) heart failure: Secondary | ICD-10-CM

## 2019-11-23 DIAGNOSIS — I10 Essential (primary) hypertension: Secondary | ICD-10-CM

## 2019-11-23 DIAGNOSIS — W19XXXA Unspecified fall, initial encounter: Secondary | ICD-10-CM

## 2019-11-23 DIAGNOSIS — I739 Peripheral vascular disease, unspecified: Secondary | ICD-10-CM

## 2019-11-23 DIAGNOSIS — Z7901 Long term (current) use of anticoagulants: Secondary | ICD-10-CM

## 2019-11-23 DIAGNOSIS — I4891 Unspecified atrial fibrillation: Secondary | ICD-10-CM

## 2019-11-23 DIAGNOSIS — S72001A Fracture of unspecified part of neck of right femur, initial encounter for closed fracture: Secondary | ICD-10-CM

## 2019-11-23 LAB — BASIC METABOLIC PANEL
Anion gap: 8 (ref 5–15)
BUN: 34 mg/dL — ABNORMAL HIGH (ref 8–23)
CO2: 26 mmol/L (ref 22–32)
Calcium: 8.9 mg/dL (ref 8.9–10.3)
Chloride: 102 mmol/L (ref 98–111)
Creatinine, Ser: 1.39 mg/dL — ABNORMAL HIGH (ref 0.61–1.24)
GFR calc Af Amer: >60 mL/min (ref >60)
GFR calc non Af Amer: 52 mL/min — ABNORMAL LOW (ref >60)
Glucose, Bld: 145 mg/dL — ABNORMAL HIGH (ref 70–99)
Potassium: 4.2 mmol/L (ref 3.5–5.1)
Sodium: 136 mmol/L (ref 135–145)

## 2019-11-23 LAB — GLUCOSE, CAPILLARY
Glucose-Capillary: 183 mg/dL — ABNORMAL HIGH (ref 70–99)
Glucose-Capillary: 253 mg/dL — ABNORMAL HIGH (ref 70–99)
Glucose-Capillary: 204 mg/dL — ABNORMAL HIGH (ref 70–99)

## 2019-11-23 LAB — CBC
HCT: 27.5 % — ABNORMAL LOW (ref 39.0–52.0)
Hemoglobin: 8.9 g/dL — ABNORMAL LOW (ref 13.0–17.0)
MCH: 29.6 pg (ref 26.0–34.0)
MCHC: 32.4 g/dL (ref 30.0–36.0)
MCV: 91.4 fL (ref 80.0–100.0)
Platelets: 260 10*3/uL (ref 150–400)
RBC: 3.01 MIL/uL — ABNORMAL LOW (ref 4.22–5.81)
RDW: 14.5 % (ref 11.5–15.5)
WBC: 7 10*3/uL (ref 4.0–10.5)
nRBC: 0.3 % — ABNORMAL HIGH (ref 0.0–0.2)

## 2019-11-23 LAB — URIC ACID: Uric Acid, Serum: 9.6 mg/dL — ABNORMAL HIGH (ref 3.7–8.6)

## 2019-11-23 MED ORDER — APIXABAN 5 MG PO TABS
5.0000 mg | ORAL_TABLET | Freq: Two times a day (BID) | ORAL | Status: DC
  Administered 2019-11-23 – 2019-11-24 (×3): 5 mg via ORAL
  Filled 2019-11-23 (×3): qty 1

## 2019-11-23 NOTE — Progress Notes (Addendum)
PROGRESS NOTE    Jason Montes  KNL:[id number] DOB: January 27, 1951 DOA: 11/14/2019 PCP: [facility]   Brief Narrative:  HPI on 11/15/2019 by Dr. Judd Gaudier Jason Montes is a 69 y.o. 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  Patient was at baseline ambulates with a cane and walker had a misstep while sweeping causing the fall.  Denies preceding one-sided weakness headache, chest pain shortness of breath or lightheadedness.  Denies preceding palpitations.  He did not strike his head and had no loss of consciousness but was unable to get up.    Interim history Patient mated with right hip fracture, status post surgery.  PT and OT recommending SNF however placement seems to be an issue.  Patient to continue working with physical therapy until is able to progress and be discharged home safely. Assessment & Plan   Intertrochanteric fracture of the right femur -Status post accidental fall -Patient also with a history of severe peripheral vascular disease status post revascularization -Orthopedics consulted and appreciated, status post intramedullary fixation of the right intertrochanteric hip on 11/16/2019 -PT and OT recommending SNF however placement is an issue -Will continue to work with PT until he can progress and go home safely -Continue pain control  Generalized weakness -Likely secondary to the above -See discussion as above -Bedside commode was also recommended however insurance was already paid for proxy 1 month prior and now denying being for another 1.  TOC consulted and made aware  Peripheral vascular disease/diabetic  foot ulcer of the left foot -Patient with dry gangrene noted on the left toe -Will need to follow-up with podiatry or orthopedics as an outpatient -Continue wound care -Patient underwent revascularization of the left leg on 10/10/2019 by Dr. Lazaro Arms due to a nonhealing ulcer.  Also has a history of right transmetatarsal amputation  Paroxysmal atrial fibrillation -Continue Coreg and Eliquis  Coronary artery disease -Currently no chest pain -Continue Coreg, lisinopril, aspirin  Diabetes mellitus, type II -Poorly controlled, hemoglobin A1c 13.8 -Continue Lantus, insulin sliding scale and CBG monitoring  Chronic systolic heart failure -Currently appears to be euvolemic and compensated -Echocardiogram on 11/15/2019 showed an EF of 40 to 45% -Continue torsemide, Coreg, lisinopril  Chronic kidney disease, stage IIIa -Continue to monitor BMP  Hyponatremia -Resolved, continue to monitor BMP  Anemia of chronic disease versus postoperative anemia -Baseline hemoglobin approximately 9-10, currently 8.9 -Continue to monitor CBC  Essential hypertension -Stable, continue Coreg and lisinopril  Thrombocytopenia -Resolved  Constipation -Continue bowel regimen  DVT Prophylaxis  Eliquis  Code Status: DNR  Family Communication: None at bedside  Disposition Plan:  Status is: Inpatient  Remains inpatient appropriate because:Ongoing active pain requiring inpatient pain management, Unsafe d/c plan and Anemia, will restart Eliquis today and monitor H/H.   Dispo: The patient is from: Home              Anticipated d/c is to: Home w/HH              Anticipated d/c date is: 1 day              Patient currently is not medically stable to d/c.  Consultants Orthopedics  Procedures  intramedullary fixation of the  right intertrochanteric hip  Antibiotics   Anti-infectives (From admission, onward)   Start     Dose/Rate Route Frequency Ordered Stop   11/17/19 0100  vancomycin (VANCOCIN) IVPB  1000 mg/200 mL premix        1,000 mg 200 mL/hr over 60 Minutes Intravenous Every 12 hours 11/16/19 1610 11/17/19 0217   11/16/19 1339  gentamicin 80 mg in 0.9% sodium chloride 250 mL irrigation  Status:  Discontinued          As needed 11/16/19 1339 11/16/19 1440   11/16/19 1230  vancomycin (VANCOCIN) 1-5 GM/200ML-% IVPB       Note to Pharmacy: Milinda Cave   : cabinet override      11/16/19 1230 11/17/19 0044   11/15/19 1630  vancomycin (VANCOCIN) IVPB 1000 mg/200 mL premix        1,000 mg 200 mL/hr over 60 Minutes Intravenous  Once 11/15/19 1629 11/15/19 1843      Subjective:   Jason Montes seen and examined today.  Concerned about his left toes and states that they are black, complains of left pinky toe pain.  Continues to have pain of his right hip.  Denies current chest pain or shortness of breath, abdominal pain, nausea or vomiting, diarrhea or constipation, dizziness or headache.  Objective:   Vitals:   11/22/19 0818 11/22/19 1548 11/22/19 2249 11/23/19 0802  BP: 134/66 (!) 105/50 (!) 105/59 115/62  Pulse: 87 78 71 79  Resp: 18 16 17 18   Temp: 97.8 F (36.6 C) 97.9 F (36.6 C) (!) 97.3 F (36.3 C) 97.8 F (36.6 C)  TempSrc: Oral Oral Oral Oral  SpO2: 99% 98% 97% 96%  Weight:        Intake/Output Summary (Last 24 hours) at 11/23/2019 1140 Last data filed at 11/23/2019 1031 Gross per 24 hour  Intake 360 ml  Output 1600 ml  Net -1240 ml   Filed Weights   11/15/19 0100  Weight: 81.6 kg    Exam  General: Well developed, well nourished, NAD, appears stated age  HEENT: NCAT, mucous membranes moist.   Cardiovascular: S1 S2 auscultated, RRR, no murmur  Respiratory: Clear to auscultation bilaterally with equal chest rise  Abdomen: Soft, nontender, nondistended, + bowel sounds  Extremities: warm dry without cyanosis clubbing or edema.  Right transmetatarsal amputation.  Left foot with dressing in place, necrotic plantar aspect of the left great toe    Neuro: AAOx3, nonfocal  Psych: Appropriate mood and affect   Data Reviewed: I have personally reviewed following labs and imaging studies  CBC: Recent Labs  Lab 11/19/19 0613 11/20/19 0427 11/21/19 0433 11/22/19 0528 11/23/19 0540  WBC 6.3 6.2 6.4 6.6 7.0  HGB 7.7* 7.0* 8.8* 9.0* 8.9*  HCT 24.5* 21.7* 25.9* 26.9* 27.5*  MCV 92.1 88.6 86.6 88.2 91.4  PLT 150 166 126* 191 696   Basic Metabolic Panel: Recent Labs  Lab 11/19/19 0407 11/20/19 0427 11/21/19 0433 11/22/19 0528 11/23/19 0540  NA 132* 130* 133* 133* 136  K 5.1 4.5 4.3 4.1 4.2  CL 99 97* 100 98 102  CO2 23 24 26 25 26   GLUCOSE 251* 216* 211* 199* 145*  BUN 46* 47* 47* 40* 34*  CREATININE 1.64* 1.78* 1.65* 1.44* 1.39*  CALCIUM 8.8* 9.1 8.7* 8.8* 8.9   GFR: Estimated Creatinine Clearance: 52.3 mL/min (A) (by C-G formula based on SCr of 1.39 mg/dL (H)). Liver Function Tests: No results for input(s): AST, ALT, ALKPHOS, BILITOT, PROT, ALBUMIN in the last 168  hours. No results for input(s): LIPASE, AMYLASE in the last 168 hours. No results for input(s): AMMONIA in the last 168 hours. Coagulation Profile: No results for input(s): INR, PROTIME in the last 168 hours. Cardiac Enzymes: No results for input(s): CKTOTAL, CKMB, CKMBINDEX, TROPONINI in the last 168 hours. BNP (last 3 results) No results for input(s): PROBNP in the last 8760 hours. HbA1C: No results for input(s): HGBA1C in the last 72 hours. CBG: Recent Labs  Lab 11/21/19 2127 11/22/19 0742 11/22/19 1209 11/22/19 1643 11/22/19 2124  GLUCAP 203* 187* 278* 263* 181*   Lipid Profile: No results for input(s): CHOL, HDL, LDLCALC, TRIG, CHOLHDL, LDLDIRECT in the last 72 hours. Thyroid Function Tests: No results for input(s): TSH, T4TOTAL, FREET4, T3FREE, THYROIDAB in the last 72 hours. Anemia Panel: No results for input(s): VITAMINB12, FOLATE, FERRITIN, TIBC, IRON, RETICCTPCT in the last 72 hours. Urine analysis:    Component Value Date/Time    COLORURINE STRAW (A) 11/15/2019 0549   APPEARANCEUR CLEAR (A) 11/15/2019 0549   LABSPEC 1.015 11/15/2019 0549   PHURINE 5.0 11/15/2019 0549   GLUCOSEU >=500 (A) 11/15/2019 0549   HGBUR NEGATIVE 11/15/2019 0549   BILIRUBINUR NEGATIVE 11/15/2019 0549   KETONESUR 5 (A) 11/15/2019 0549   PROTEINUR NEGATIVE 11/15/2019 0549   NITRITE NEGATIVE 11/15/2019 0549   LEUKOCYTESUR NEGATIVE 11/15/2019 0549   Sepsis Labs: @LABRCNTIP (procalcitonin:4,lacticidven:4)  ) Recent Results (from the past 240 hour(s))  SARS Coronavirus 2 by RT PCR (hospital order, performed in [place] hospital lab) Nasopharyngeal Nasopharyngeal Swab     Status: None   Collection Time: 11/15/19 12:36 AM   Specimen: Nasopharyngeal Swab  Result Value Ref Range Status   SARS Coronavirus 2 NEGATIVE NEGATIVE Final    Comment: (NOTE) SARS-CoV-2 target nucleic acids are NOT DETECTED.  The SARS-CoV-2 RNA is generally detectable in upper and lower respiratory specimens during the acute phase of infection. The lowest concentration of SARS-CoV-2 viral copies this assay can detect is 250 copies / mL. A negative result does not preclude SARS-CoV-2 infection and should not be used as the sole basis for treatment or other patient management decisions.  A negative result may occur with improper specimen collection / handling, submission of specimen other than nasopharyngeal swab, presence of viral mutation(s) within the areas targeted by this assay, and inadequate number of viral copies (<250 copies / mL). A negative result must be combined with clinical observations, patient history, and epidemiological information.  Fact Sheet for Patients:   StrictlyIdeas.no  Fact Sheet for Healthcare Providers: BankingDealers.co.za  This test is not yet approved or  cleared by the [place] FDA and has been authorized for detection and/or diagnosis of SARS-CoV-2 by FDA under an Emergency  Use Authorization (EUA).  This EUA will remain in effect (meaning this test can be used) for the duration of the COVID-19 declaration under Section 564(b)(1) of the Act, 21 U.S.C. section 360bbb-3(b)(1), unless the authorization is terminated or revoked sooner.  Performed at [facility], [address] [place], [place] [address]   Surgical pcr screen     Status: Abnormal   Collection Time: 11/15/19  4:05 AM   Specimen: Nasal Mucosa; Nasal Swab  Result Value Ref Range Status   MRSA, PCR POSITIVE (A) NEGATIVE Final    Comment: RESULT CALLED TO, READ BACK BY AND VERIFIED WITH: Jamse Belfast RN [phone] 11/15/19 HNM    Staphylococcus aureus POSITIVE (A) NEGATIVE Final    Comment: (NOTE) The Xpert SA Assay (FDA approved for NASAL specimens  in patients 69 years of age and older), is one component of a comprehensive surveillance program. It is not intended to diagnose infection nor to guide or monitor treatment. Performed at [facility], [address] [place], [place] [address]       Radiology Studies: No results found.   Scheduled Meds: . aspirin EC  81 mg Oral Daily  . atorvastatin  40 mg Oral QHS  . carvedilol  3.125 mg Oral BID WC  . docusate sodium  100 mg Oral BID  . famotidine  20 mg Oral Daily  . feeding supplement (GLUCERNA SHAKE)  237 mL Oral BID BM  . gabapentin  300 mg Oral BID  . insulin aspart  0-15 Units Subcutaneous TID WC  . insulin aspart  0-5 Units Subcutaneous QHS  . insulin glargine  5 Units Subcutaneous Daily  . lisinopril  2.5 mg Oral Daily  . liver oil-zinc oxide   Topical BID  . pneumococcal 23 valent vaccine  0.5 mL Intramuscular Tomorrow-1000  . polyethylene glycol  17 g Oral BID  . polyethylene glycol-electrolytes  4,000 mL Oral Once  . Ensure Max Protein  11 oz Oral Daily  . senna  1 tablet Oral BID  . tamsulosin  0.4 mg Oral Daily  . torsemide  20 mg Oral Daily  . traMADol  50 mg Oral Q6H   Continuous Infusions:     LOS: 8 days   Time Spent in minutes   45 minutes  Alise Calais D.O. on 11/23/2019 at 11:40 AM  Between 7am to 7pm - Please see pager noted on [email or web address]  After 7pm go to [email or web address]  And look for the night coverage person covering for me after hours  [facility] Office  [phone]

## 2019-11-23 NOTE — Progress Notes (Signed)
Physical Therapy Treatment Patient Details Name: Jason Montes MRN: [id number] DOB: Dec 08, 1950 Today's Date: 11/23/2019    History of Present Illness Per MD notes: Pt is a 69 y.o. 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 Pt diagnosed with R Intertrochanteric hip fx and is s/p IM fixation.    PT Comments    Patient alert, agreeable to PT, reported no pain at rest, exhibited mild-mod pain signs with mobility and transfers this session. The patient was able to perform exercises without physical assist for RLE with extended time. Supine to sit performed from flat bed, heavy use of bed rails, and very light minA for complete trunk elevation. Sit<> stand from slightly elevated bed surface and BSC over standard commode, CGA. The patient ambulated ~43ft total with RW and CGA. Exhibited antalgic gait, utilized step to gait pattern, reported fatigue (several standing rest breaks needed). Pt up in chair, all needs in reach at end of session. The patient would benefit from further skilled PT intervention to continue to progress towards goals. Recommendation remains appropriate.     Follow Up Recommendations  SNF     Equipment Recommendations  Wheelchair (measurements PT);Wheelchair cushion (measurements PT);3in1 (PT)    Recommendations for Other Services       Precautions / Restrictions Precautions Precautions: Fall Restrictions Weight Bearing Restrictions: Yes LUE Weight Bearing: Weight bearing as tolerated RLE Weight Bearing: Weight bearing as tolerated LLE Weight Bearing: Weight bearing as tolerated Other Position/Activity Restrictions: Pt with L diabetic foot  ulcer, WBAT per Dr. Luana Shu vebal order 11/17/19 with a shoe donned, does not have to be a post-op shoe    Mobility  Bed Mobility Overal bed mobility: Needs Assistance Bed Mobility: Supine to Sit     Supine to sit: Min assist     General bed mobility comments: very light minA for complete trunk elevation from flat bed  Transfers Overall transfer level: Needs assistance Equipment used: Rolling walker (2 wheeled) Transfers: Sit to/from Stand Sit to Stand: Min guard         General transfer comment: CGA from slightly elevated bed surface, CGA from BSC over standard toilet  Ambulation/Gait Ambulation/Gait assistance: Min guard Gait Distance (Feet): 70 Feet Assistive device: Rolling walker (2 wheeled) Gait Pattern/deviations: Antalgic;Decreased stance time - right;Decreased step length - left     General Gait Details: pt able to ambulate to [facility] over standard toilet, after seated rest break ambulated additional 32ft. step to gait pattern, chair follow for safety   Stairs             Wheelchair Mobility    Modified Rankin (Stroke Patients Only)       Balance Overall balance assessment: Needs assistance Sitting-balance support: No upper extremity supported;Feet supported Sitting balance-Leahy Scale: Good Sitting balance - Comments: assist with sock donning needed   Standing balance support: Bilateral upper extremity supported Standing balance-Leahy Scale: Poor Standing balance comment: requires BUE support at all times but no overt LOB today                            Cognition Arousal/Alertness: Awake/alert Behavior During Therapy: WFL for tasks assessed/performed Overall Cognitive Status: Within Functional Limits for  tasks assessed                                 General Comments: Pt is alert throughout and cooperative. able to follow commands throughout.      Exercises Total Joint Exercises Ankle Circles/Pumps:  AROM;Strengthening;Both;10 reps;Supine Quad Sets: AROM;Strengthening;Both;10 reps;Supine Heel Slides: Right;Left;Strengthening;10 reps;Supine;AROM Hip ABduction/ADduction: AROM;Right;10 reps;Strengthening Straight Leg Raises: AROM;Right;10 reps;Strengthening Other Exercises Other Exercises: able to use BSC over standard commode, supervision for toileting needs, CGA for transfers with cueing for hand placement    General Comments        Pertinent Vitals/Pain Pain Assessment: Faces Faces Pain Scale: Hurts little more Pain Location: R hip/thigh with mobility and transfers Pain Descriptors / Indicators: Aching;Sore;Guarding Pain Intervention(s): Limited activity within patient's tolerance;Monitored during session;Premedicated before session;Repositioned    Home Living                      Prior Function            PT Goals (current goals can now be found in the care plan section) Progress towards PT goals: Progressing toward goals    Frequency    BID      PT Plan Current plan remains appropriate    Co-evaluation              AM-PAC PT "6 Clicks" Mobility   Outcome Measure  Help needed turning from your back to your side while in a flat bed without using bedrails?: A Little Help needed moving from lying on your back to sitting on the side of a flat bed without using bedrails?: A Little Help needed moving to and from a bed to a chair (including a wheelchair)?: A Little Help needed standing up from a chair using your arms (e.g., wheelchair or bedside chair)?: A Little Help needed to walk in hospital room?: A Little Help needed climbing 3-5 steps with a railing? : A Lot 6 Click Score: 17    End of Session Equipment Utilized During Treatment: Gait belt Activity Tolerance: Patient limited by pain Patient left: with chair alarm set;in chair;with call bell/phone within reach Nurse Communication: Mobility status;Precautions;Other (comment) PT Visit Diagnosis:  Unsteadiness on feet (R26.81);Other abnormalities of gait and mobility (R26.89);Muscle weakness (generalized) (M62.81);Pain Pain - Right/Left: Right Pain - part of body: Hip     Time: 6294-7654 PT Time Calculation (min) (ACUTE ONLY): 49 min  Charges:  $Therapeutic Exercise: 38-52 mins                     Lieutenant Diego PT, DPT 11:38 AM,11/23/19

## 2019-11-23 NOTE — Progress Notes (Signed)
Physical Therapy Treatment Patient Details Name: Jason Montes MRN: [id number] DOB: [birth year] Today's Date: 11/23/2019    History of Present Illness Per MD notes: Pt is a 69 y.o. 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 Pt diagnosed with R Intertrochanteric hip fx and is s/p IM fixation.    PT Comments    Pt in bed, easily woken. Reported some pain at start of session and exhibited mod pain signs with stair training and at end of session. Session focused on promoting functional mobility; lengthy discussion of wheelchair level activities in preparation for home, [facility] management, stair navigation. Pt performed bed mobility with supervision from flat bed and heavy use of bed rails, would benefit from further training with transferring to R side similar to home set up. Sit <> stand with RW and CGA from EOB and standard recliner several times. Able to stand and use urinal with CGA and unilateral UE support, heavy posterior support against bed via legs. Pt able to ascend stairs with CGA And step by step cueing with one rail, pt needed 2 rails and CGA +2 for safety descending stairs. Pt fatigued at end of session. The patient would benefit from further skilled PT intervention to continue to progress towards goals. Recommendation remains appropriate. The patient would benefit from wheelchair mobility training/navigation next session to ensure safe discharge home.       Follow Up Recommendations  SNF     Equipment Recommendations  Wheelchair (measurements PT);Wheelchair cushion (measurements PT);3in1 (PT)    Recommendations for Other Services       Precautions /  Restrictions Precautions Precautions: Fall Restrictions Weight Bearing Restrictions: Yes LUE Weight Bearing: Weight bearing as tolerated RLE Weight Bearing: Weight bearing as tolerated LLE Weight Bearing: Weight bearing as tolerated Other Position/Activity Restrictions: Pt with L diabetic foot ulcer, WBAT per Dr. Luana Shu vebal order 11/17/19 with a shoe donned, does not have to be a post-op shoe    Mobility  Bed Mobility Overal bed mobility: Needs Assistance Bed Mobility: Supine to Sit     Supine to sit: Supervision     General bed mobility comments: supervision from flat bed, cueing for sequencing to improve independence. heavy use of bed rail  Transfers Overall transfer level: Needs assistance Equipment used: Rolling walker (2 wheeled) Transfers: Sit to/from Stand Sit to Stand: Min guard         General transfer comment: CGA from EOB and recliner several times during session, CGA ea attempt  Ambulation/Gait Ambulation/Gait assistance: Min guard Gait Distance (Feet): 5 Feet Assistive device: Rolling walker (2 wheeled) Gait Pattern/deviations: Antalgic;Decreased stance time - right;Decreased step length - left Gait velocity: decreased   General Gait Details: pt able to perform several stand pivot transfers and ambulate short distances without physical assist   Stairs Stairs: Yes Stairs assistance: Min assist;Min guard Stair Management: One rail Left;Two rails;Step to pattern Number of Stairs: 4 General stair comments: Pt able to ascend stairs with CGA and significant UE support. 2 rail assist with CGA on descent of stairs   Wheelchair Mobility    Modified Rankin (Stroke Patients Only)       Balance Overall balance assessment: Needs assistance Sitting-balance support: No upper extremity supported;Feet supported Sitting balance-Leahy Scale:  Good Sitting balance - Comments: assist with sock donning needed   Standing balance support: Bilateral upper extremity  supported Standing balance-Leahy Scale: Poor Standing balance comment: requires BUE support at all times but no overt LOB today                            Cognition Arousal/Alertness: Awake/alert Behavior During Therapy: WFL for tasks assessed/performed;Anxious Overall Cognitive Status: Within Functional Limits for tasks assessed                                 General Comments: Pt is alert throughout and cooperative. able to follow commands throughout.      Exercises Other Exercises Other Exercises: able stand and urinate with CGA with use of urinal    General Comments        Pertinent Vitals/Pain Pain Assessment: Faces Faces Pain Scale: Hurts even more Pain Location: R hip/thigh with mobility and transfers Pain Descriptors / Indicators: Aching;Sore;Guarding Pain Intervention(s): Limited activity within patient's tolerance;Monitored during session;Premedicated before session;Repositioned;Ice applied    Home Living                      Prior Function            PT Goals (current goals can now be found in the care plan section) Progress towards PT goals: Progressing toward goals    Frequency    BID      PT Plan Current plan remains appropriate    Co-evaluation              AM-PAC PT "6 Clicks" Mobility   Outcome Measure  Help needed turning from your back to your side while in a flat bed without using bedrails?: A Little Help needed moving from lying on your back to sitting on the side of a flat bed without using bedrails?: A Little Help needed moving to and from a bed to a chair (including a wheelchair)?: A Little Help needed standing up from a chair using your arms (e.g., wheelchair or bedside chair)?: A Little Help needed to walk in hospital room?: A Little Help needed climbing 3-5 steps with a railing? : A Lot 6 Click Score: 17    End of Session Equipment Utilized During Treatment: Gait belt Activity Tolerance:  Patient limited by pain Patient left: with chair alarm set;in chair;with call bell/phone within reach Nurse Communication: Mobility status;Precautions;Other (comment) PT Visit Diagnosis: Unsteadiness on feet (R26.81);Other abnormalities of gait and mobility (R26.89);Muscle weakness (generalized) (M62.81);Pain Pain - Right/Left: Right Pain - part of body: Hip     Time: 1359-1459 PT Time Calculation (min) (ACUTE ONLY): 60 min  Charges:  $Gait Training: 8-22 mins $Therapeutic Exercise: 8-22 mins $Therapeutic Activity: 23-37 mins                     Lieutenant Diego PT, DPT 4:30 PM,11/23/19

## 2019-11-23 NOTE — Plan of Care (Signed)
  Problem: Fluid Volume: Goal: Ability to maintain a balanced intake and output will improve Outcome: Progressing   

## 2019-11-23 NOTE — Progress Notes (Signed)
ANTICOAGULATION CONSULT NOTE - Initial Consult  Pharmacy Consult for apixaban Indication: atrial fibrillation  Allergies  Allergen Reactions  . Norvasc [Amlodipine Besylate]   . Penicillins     Childhood allergy, not sure what happens     Patient Measurements: Weight: 81.6 kg (179 lb 14.3 oz) Heparin Dosing Weight:    Vital Signs: Temp: 97.8 F (36.6 C) (06/23 0802) Temp Source: Oral (06/23 0802) BP: 115/62 (06/23 0802) Pulse Rate: 79 (06/23 0802)  Labs: Recent Labs    11/21/19 0433 11/21/19 0433 11/22/19 0528 11/23/19 0540  HGB 8.8*   < > 9.0* 8.9*  HCT 25.9*  --  26.9* 27.5*  PLT 126*  --  191 260  CREATININE 1.65*  --  1.44* 1.39*   < > = values in this interval not displayed.    Estimated Creatinine Clearance: 52.3 mL/min (A) (by C-G formula based on SCr of 1.39 mg/dL (H)).   Medical History: Past Medical History:  Diagnosis Date  . Allergy   . Atrial fibrillation ([place])   . B12 deficiency   . Chronic kidney disease   . Chronic kidney disease (CKD), stage II (mild)   . Coronary atherosclerosis   . Diabetes mellitus without complication ([place])   . Elevated PSA   . Esophagitis   . GERD (gastroesophageal reflux disease)   . Heart attack ([place])   . Heart disease   . Hyperlipidemia   . Hypertension   . Hypokalemia   . Mild neurocognitive disorder   . Osteoarthritis   . Presbyopia   . PVD (peripheral vascular disease) ([place])   . Restless leg syndrome   . Stroke (cerebrum) ([place])   . Stroke ([place])   . Subacute osteomyelitis ([place])   . Tinea unguium   . Uncompensated short term memory deficit     Medications:  Scheduled:  . apixaban  5 mg Oral BID  . aspirin EC  81 mg Oral Daily  . atorvastatin  40 mg Oral QHS  . carvedilol  3.125 mg Oral BID WC  . docusate sodium  100 mg Oral BID  . famotidine  20 mg Oral Daily  . feeding supplement (GLUCERNA SHAKE)  237 mL Oral BID BM  . gabapentin  300 mg Oral BID  . insulin aspart  0-15 Units Subcutaneous TID  WC  . insulin aspart  0-5 Units Subcutaneous QHS  . insulin glargine  5 Units Subcutaneous Daily  . lisinopril  2.5 mg Oral Daily  . liver oil-zinc oxide   Topical BID  . pneumococcal 23 valent vaccine  0.5 mL Intramuscular Tomorrow-1000  . polyethylene glycol  17 g Oral BID  . polyethylene glycol-electrolytes  4,000 mL Oral Once  . Ensure Max Protein  11 oz Oral Daily  . senna  1 tablet Oral BID  . tamsulosin  0.4 mg Oral Daily  . torsemide  20 mg Oral Daily  . traMADol  50 mg Oral Q6H   Infusions:    Assessment: 69 yo M to restart apixaban for Afib. Patient on apixaban 5 mg PO BID PTA. Had surgery for Hip fx.  Had drop in Hgb and plts and per surgery note:Patient's Eliquis is causing increased drainage from his surgical site.  apixaban d/c then.   Goal of Therapy:  Monitor platelets by anticoagulation protocol: Yes   Plan:  Will order apixaban 5 mg PO BID. Will f/u renal fxn and CBC per protocol  Collyn Selk A 11/23/2019,12:13 PM

## 2019-11-23 NOTE — Progress Notes (Signed)
Subjective:  Patient reports pain as mild.  Alert and cooperative.  Has been out of bed and walk some.  Still little weak.  Hemoglobin 8.9.  Dressings are dry.  Plan is to go home with home health PT. Complains of signficant pain at Left pinky toe.  Reports history of gout.  Objective:   VITALS:   Vitals:   11/21/19 2315 11/22/19 0818 11/22/19 1548 11/22/19 2249  BP: 118/67 134/66 (!) 105/50 (!) 105/59  Pulse: 81 87 78 71  Resp: 15 18 16 17   Temp: 98.2 F (36.8 C) 97.8 F (36.6 C) 97.9 F (36.6 C) (!) 97.3 F (36.3 C)  TempSrc: Oral Oral Oral Oral  SpO2: 98% 99% 98% 97%  Weight:        PHYSICAL EXAM:  Neurologically intact ABD soft Neurovascular intact Sensation intact distally Intact pulses distally Dorsiflexion/Plantar flexion intact Incision: dressing C/D/I No cellulitis present Compartment soft  LABS  Results for orders placed or performed during the hospital encounter of 11/14/19 (from the past 24 hour(s))  Glucose, capillary     Status: Abnormal   Collection Time: 11/22/19  7:42 AM  Result Value Ref Range   Glucose-Capillary 187 (H) 70 - 99 mg/dL   Comment 1 Notify RN   Glucose, capillary     Status: Abnormal   Collection Time: 11/22/19 12:09 PM  Result Value Ref Range   Glucose-Capillary 278 (H) 70 - 99 mg/dL  Glucose, capillary     Status: Abnormal   Collection Time: 11/22/19  4:43 PM  Result Value Ref Range   Glucose-Capillary 263 (H) 70 - 99 mg/dL  Glucose, capillary     Status: Abnormal   Collection Time: 11/22/19  9:24 PM  Result Value Ref Range   Glucose-Capillary 181 (H) 70 - 99 mg/dL   Comment 1 Notify RN   Basic metabolic panel     Status: Abnormal   Collection Time: 11/23/19  5:40 AM  Result Value Ref Range   Sodium 136 135 - 145 mmol/L   Potassium 4.2 3.5 - 5.1 mmol/L   Chloride 102 98 - 111 mmol/L   CO2 26 22 - 32 mmol/L   Glucose, Bld 145 (H) 70 - 99 mg/dL   BUN 34 (H) 8 - 23 mg/dL   Creatinine, Ser 1.39 (H) 0.61 - 1.24 mg/dL    Calcium 8.9 8.9 - 10.3 mg/dL   GFR calc non Af Amer 52 (L) >60 mL/min   GFR calc Af Amer >60 >60 mL/min   Anion gap 8 5 - 15  CBC     Status: Abnormal   Collection Time: 11/23/19  5:40 AM  Result Value Ref Range   WBC 7.0 4.0 - 10.5 K/uL   RBC 3.01 (L) 4.22 - 5.81 MIL/uL   Hemoglobin 8.9 (L) 13.0 - 17.0 g/dL   HCT 27.5 (L) 39 - 52 %   MCV 91.4 80.0 - 100.0 fL   MCH 29.6 26.0 - 34.0 pg   MCHC 32.4 30.0 - 36.0 g/dL   RDW 14.5 11.5 - 15.5 %   Platelets 260 150 - 400 K/uL   nRBC 0.3 (H) 0.0 - 0.2 %    No results found.  Assessment/Plan: 7 Days Post-Op   Principal Problem:   Intertrochanteric fracture of right femur, closed, initial encounter ([place]) Active Problems:   Atrial fibrillation ([place])   CAD (coronary artery disease)   DM (diabetes mellitus), type 2, uncontrolled with complications ([place])   Chronic diastolic heart failure ([place])  Chronic anticoagulation   Essential hypertension   PVD (peripheral vascular disease) ([place])   Accidental fall   Preoperative clearance   Closed right hip fracture, initial encounter ([place])   Advance diet Up with therapy   Left toe pain,  Loosened dressing pain much better.  Will order uric acid to rule out gout.  Discharge home with home health   Resume Eliquis at home  Patient may weight-bear as tolerated with walker.  Follow-up with Dr. Mack Guise in 2 weeks at our office   Carlynn Spry , MD 11/23/2019, 7:24 AM

## 2019-11-24 DIAGNOSIS — S72144A Nondisplaced intertrochanteric fracture of right femur, initial encounter for closed fracture: Secondary | ICD-10-CM | POA: Diagnosis not present

## 2019-11-24 LAB — CBC
HCT: 29.4 % — ABNORMAL LOW (ref 39.0–52.0)
Hemoglobin: 9.2 g/dL — ABNORMAL LOW (ref 13.0–17.0)
MCH: 28.8 pg (ref 26.0–34.0)
MCHC: 31.3 g/dL (ref 30.0–36.0)
MCV: 91.9 fL (ref 80.0–100.0)
Platelets: 264 10*3/uL (ref 150–400)
RBC: 3.2 MIL/uL — ABNORMAL LOW (ref 4.22–5.81)
RDW: 14.4 % (ref 11.5–15.5)
WBC: 6.6 10*3/uL (ref 4.0–10.5)
nRBC: 0 % (ref 0.0–0.2)

## 2019-11-24 LAB — GLUCOSE, CAPILLARY
Glucose-Capillary: 138 mg/dL — ABNORMAL HIGH (ref 70–99)
Glucose-Capillary: 175 mg/dL — ABNORMAL HIGH (ref 70–99)
Glucose-Capillary: 290 mg/dL — ABNORMAL HIGH (ref 70–99)
Glucose-Capillary: 253 mg/dL — ABNORMAL HIGH (ref 70–99)

## 2019-11-24 MED ORDER — ENSURE MAX PROTEIN PO LIQD: 11.0000 [oz_av] | Freq: Every day | ORAL | Status: DC

## 2019-11-24 MED ORDER — GLUCERNA SHAKE PO LIQD: 237.0000 mL | Freq: Two times a day (BID) | ORAL | 0 refills | Status: DC

## 2019-11-24 MED ORDER — SENNA 8.6 MG PO TABS: 1.0000 | ORAL_TABLET | Freq: Two times a day (BID) | ORAL | 0 refills | Status: DC

## 2019-11-24 MED ORDER — HYDROCODONE-ACETAMINOPHEN 5-325 MG PO TABS: 1.0000 | ORAL_TABLET | Freq: Four times a day (QID) | ORAL | 0 refills | Status: DC | PRN

## 2019-11-24 MED ORDER — TRAMADOL HCL 50 MG PO TABS: 50.0000 mg | ORAL_TABLET | Freq: Four times a day (QID) | ORAL | 0 refills | Status: DC

## 2019-11-24 MED ORDER — GABAPENTIN 300 MG PO CAPS: 300.0000 mg | ORAL_CAPSULE | Freq: Two times a day (BID) | ORAL | 0 refills | Status: DC

## 2019-11-24 MED ORDER — SENNOSIDES-DOCUSATE SODIUM 8.6-50 MG PO TABS: 1.0000 | ORAL_TABLET | Freq: Every evening | ORAL | Status: DC | PRN

## 2019-11-24 NOTE — Progress Notes (Signed)
Physical Therapy Treatment Patient Details Name: Jason Montes MRN: [id number] DOB: Oct 21, 1950 Today's Date: 11/24/2019    History of Present Illness Per MD notes: Pt is a 69 y.o. 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 Pt diagnosed with R Intertrochanteric hip fx and is s/p IM fixation.    PT Comments    Pt alert, up in chair reported mild hip pain, exhibited mod pain signs/symptoms with transfers. Session focused on promoting functional mobility and independence; pt able to stand pivot from several surfaces including a wheelchair with CGA/supervision with RW. Pt able to manipulate and use WC after initial instruction without assistance, and perform toileting tasks with supervision. Pt also reported that he felt it was a successful treatment and that he would be ready to go home. No further questions at this time, CM informed PT pt will have an aide all day as well. The patient would benefit from further skilled PT intervention to continue to progress towards goals. Recommendation remains appropriate, RN, MD, and CM notified of pt status and progress.     Follow Up Recommendations  SNF     Equipment Recommendations  Wheelchair (measurements PT);Wheelchair cushion (measurements PT);3in1 (PT)    Recommendations for Other Services       Precautions / Restrictions Precautions Precautions: Fall Restrictions Weight Bearing Restrictions: Yes RLE Weight Bearing: Weight bearing as tolerated LLE Weight Bearing: Weight bearing as tolerated Other Position/Activity Restrictions: Pt with L diabetic foot ulcer, WBAT per Dr. Luana Shu vebal order 11/17/19 with a shoe  donned, does not have to be a post-op shoe    Mobility  Bed Mobility               General bed mobility comments: pt up in recliner at start of session  Transfers Overall transfer level: Needs assistance Equipment used: Rolling walker (2 wheeled) Transfers: Sit to/from Stand Sit to Stand: Min guard;Supervision Stand pivot transfers: Min guard       General transfer comment: stand pivot from recliner to WC, WC to BSC, CGA from recliner to [place], supervision from [facility]  Ambulation/Gait Ambulation/Gait assistance: Min guard Gait Distance (Feet): 4 Feet Assistive device: Rolling walker (2 wheeled) Gait Pattern/deviations: Antalgic;Decreased stance time - right;Decreased step length - left     General Gait Details: utilized step to gait pattern predominantly   Marine scientist [place] (Stroke Patients Only)       Balance Overall balance assessment: Needs assistance Sitting-balance support: No upper extremity supported;Feet supported Sitting balance-Leahy Scale: Good Sitting balance - Comments: able to work wheelchair mechanisms without assistance   Standing balance support: Bilateral upper extremity supported Standing balance-Leahy Scale: Poor Standing balance comment: requires BUE support at all times but no overt LOB today                            Cognition Arousal/Alertness: Awake/alert Behavior During Therapy: WFL for tasks assessed/performed;Anxious Overall Cognitive Status: Within Functional Limits for tasks assessed  General Comments: Pt is alert throughout and cooperative. able to follow commands throughout.      Exercises Other Exercises Other Exercises: able to stand pivot from [facility] to [facility] this session, supervision for toileting tasks Other Exercises: Pt able to transfer into WC, close RW and manipulate WC and WC parts without assistance (leg rest, breaks,  etc). Able to roll ~74ft with use of UEs.    General Comments        Pertinent Vitals/Pain Pain Assessment: Faces Faces Pain Scale: Hurts little more Pain Location: R hip/thigh with mobility and transfers Pain Descriptors / Indicators: Aching;Sore;Guarding;Grimacing Pain Intervention(s): Limited activity within patient's tolerance;Monitored during session;Premedicated before session;Repositioned    Home Living                      Prior Function            PT Goals (current goals can now be found in the care plan section) Progress towards PT goals: Progressing toward goals    Frequency    BID      PT Plan Current plan remains appropriate    Co-evaluation              AM-PAC PT "6 Clicks" Mobility   Outcome Measure  Help needed turning from your back to your side while in a flat bed without using bedrails?: A Little Help needed moving from lying on your back to sitting on the side of a flat bed without using bedrails?: A Little Help needed moving to and from a bed to a chair (including a wheelchair)?: None Help needed standing up from a chair using your arms (e.g., wheelchair or bedside chair)?: None Help needed to walk in hospital room?: A Little Help needed climbing 3-5 steps with a railing? : A Lot 6 Click Score: 19    End of Session Equipment Utilized During Treatment: Gait belt Activity Tolerance: Patient tolerated treatment well Patient left: with chair alarm set;in chair;with call bell/phone within reach Nurse Communication: Mobility status;Precautions;Other (comment) PT Visit Diagnosis: Unsteadiness on feet (R26.81);Other abnormalities of gait and mobility (R26.89);Muscle weakness (generalized) (M62.81);Pain Pain - Right/Left: Right Pain - part of body: Hip     Time: 4742-5956 PT Time Calculation (min) (ACUTE ONLY): 49 min  Charges:  $Gait Training: 8-22 mins $Therapeutic Exercise: 23-37 mins                     Lieutenant Diego PT,  DPT 11:44 AM,11/24/19

## 2019-11-24 NOTE — Progress Notes (Signed)
Patient is stable and ready for discharge going home with home health care services. Patient's IV removed. Patient assisted by nurse tech to get dressed and all patient's belongings packed and with patient to be transport with him via EMS. Writer went over discharge paperwork with patient and he verbalized understanding and discharge paperwork given to patient in a packet. Nurse tech with with EMS down to transport patient's [facility] being sent with patient home. DNR paperwork also in discharge packet given to EMS signed by MD. Report given to transport team.

## 2019-11-24 NOTE — Progress Notes (Signed)
  Subjective:  Patient reports pain as mild.  Patient did not complain of left pinky toe pain today.    Objective:   VITALS:   Vitals:   11/22/19 2249 11/23/19 0802 11/23/19 1500 11/24/19 0019  BP: (!) 105/59 115/62 (!) 107/49 (!) 108/58  Pulse: 71 79 72 74  Resp: 17 18 18 17   Temp: (!) 97.3 F (36.3 C) 97.8 F (36.6 C) 97.7 F (36.5 C) 98.2 F (36.8 C)  TempSrc: Oral Oral Oral   SpO2: 97% 96% 99% 99%  Weight:        PHYSICAL EXAM:  Neurologically intact Neurovascular intact Sensation intact distally Intact pulses distally Incision: no drainage No cellulitis present Compartment soft  LABS  Results for orders placed or performed during the hospital encounter of 11/14/19 (from the past 24 hour(s))  Glucose, capillary     Status: Abnormal   Collection Time: 11/23/19 12:12 PM  Result Value Ref Range   Glucose-Capillary 183 (H) 70 - 99 mg/dL   Comment 1 Notify RN    Comment 2 Document in Chart   Glucose, capillary     Status: Abnormal   Collection Time: 11/23/19  5:02 PM  Result Value Ref Range   Glucose-Capillary 253 (H) 70 - 99 mg/dL   Comment 1 Notify RN    Comment 2 Document in Chart   Glucose, capillary     Status: Abnormal   Collection Time: 11/23/19  9:07 PM  Result Value Ref Range   Glucose-Capillary 204 (H) 70 - 99 mg/dL   Comment 1 Notify RN   CBC     Status: Abnormal   Collection Time: 11/24/19  5:17 AM  Result Value Ref Range   WBC 6.6 4.0 - 10.5 K/uL   RBC 3.20 (L) 4.22 - 5.81 MIL/uL   Hemoglobin 9.2 (L) 13.0 - 17.0 g/dL   HCT 29.4 (L) 39 - 52 %   MCV 91.9 80.0 - 100.0 fL   MCH 28.8 26.0 - 34.0 pg   MCHC 31.3 30.0 - 36.0 g/dL   RDW 14.4 11.5 - 15.5 %   Platelets 264 150 - 400 K/uL   nRBC 0.0 0.0 - 0.2 %    No results found.  Assessment/Plan: 8 Days Post-Op   Principal Problem:   Intertrochanteric fracture of right femur, closed, initial encounter ([place]) Active Problems:   Atrial fibrillation ([place])   CAD (coronary artery disease)   DM  (diabetes mellitus), type 2, uncontrolled with complications (HCC)   Chronic diastolic heart failure (HCC)   Chronic anticoagulation   Essential hypertension   PVD (peripheral vascular disease) ([place])   Accidental fall   Preoperative clearance   Closed right hip fracture, initial encounter ([place])   Advance diet Up with therapy   Patient did not complain of toe pain today.  We will monitor his symptoms.   Discharge home with home health  Resume Eliquis at home  Patient may weight-bear as tolerated with walker.  Follow-up with Dr. Mack Guise in 2 weeks at our office   Jason Montes , MD 11/24/2019, 7:43 AM

## 2019-11-24 NOTE — Discharge Summary (Signed)
Physician Discharge Summary  Jason Montes CHE:[id number] DOB: 1951/02/13 DOA: 11/14/2019  PCP: [facility]  Admit date: 11/14/2019 Discharge date: 11/24/2019  Time spent: 45 minutes  Recommendations for Outpatient Follow-up:  Patient will be discharged to home with home health services.  Patient will need to follow up with primary care provider within one week of discharge.  Patient should continue medications as prescribed.  Patient should follow a heart healthy/carb modified diet.    Discharge Diagnoses:  Intertrochanteric fracture of the right femur Generalized weakness Peripheral vascular disease/diabetic foot ulcer of the left foot Paroxysmal atrial fibrillation Coronary artery disease Diabetes mellitus, type II Chronic systolic heart failure Chronic kidney disease, stage IIIa Hyponatremia Anemia of chronic disease versus postoperative anemia Essential hypertension Thrombocytopenia Constipation   Discharge Condition: Stable  Diet recommendation: Heart healthy/carb modified  Filed Weights   11/15/19 0100  Weight: 81.6 kg    History of present illness:  on 11/15/2019 by Dr. Vanita Ingles [name] a 68 y.o.malewith medical history significant foruncontrolled type 2 diabetes complicated by nephropathy and polyneuropathy, peripheral arterial disease, status post recent revascularization on 10/11/2019 secondary to nonhealing left diabetic foot ulcer, atrial fibrillation on Eliquis, HTN, BPH who presented to the emergency room by EMS following a fall onto his right hip while sweeping the floor. Patient was at baseline ambulates with a cane and walker had a misstep while sweeping causing the fall. Denies preceding one-sided weakness headache, chest pain shortness of breath or lightheadedness. Denies preceding palpitations. He did not strike his head and had no loss of consciousness but was unable to get up.   Hospital Course:    Intertrochanteric fracture of the right femur -Status post accidental fall -Patient also with a history of severe peripheral vascular disease status post revascularization -Orthopedics consulted and appreciated, status post intramedullary fixation of the right intertrochanteric hip on 11/16/2019 -PT and OT recommending SNF however placement is an issue -Patient is continue to work with physical therapy and has progressed -Per orthopedics, patient is able to weight-bear as tolerated with walker. He should follow-up with Dr. Mack Guise in 2 weeks. -Continue pain control  Generalized weakness -Likely secondary to the above -See discussion as above -TOC consulted and made aware  Peripheral vascular disease/diabetic foot ulcer of the left foot -Patient with dry gangrene noted on the left toe -Will need to follow-up with podiatry or orthopedics as an outpatient -Continue wound care -Patient underwent revascularization of the left leg on 10/10/2019 by Dr. Lazaro Arms due to a nonhealing ulcer.  Also has a history of right transmetatarsal amputation  Paroxysmal atrial fibrillation -Continue Coreg and Eliquis  Coronary artery disease -Currently no chest pain -Continue Coreg, lisinopril, aspirin  Diabetes mellitus, type II -Poorly controlled, hemoglobin A1c 13.8 -Continue Lantus, insulin sliding scale and CBG monitoring  Chronic systolic heart failure -Currently appears to be euvolemic and compensated -Echocardiogram on 11/15/2019 showed an EF of 40 to 45% -Continue torsemide, Coreg, lisinopril  Chronic kidney disease, stage IIIa -Stable, creatinine currently 1.39  Hyponatremia -Resolved  Anemia of chronic disease versus postoperative anemia -Baseline hemoglobin approximately 9-10, currently 9.2  Essential hypertension -Stable, continue Coreg and lisinopril  Thrombocytopenia -Resolved  Constipation -Continue bowel regimen  Code status:  DNR  Consultants Orthopedics  Procedures  intramedullary fixation of the right intertrochanteric hip   Discharge Exam: Vitals:   11/23/19 1500 11/24/19 0019  BP: (!) 107/49 (!) 108/58  Pulse: 72 74  Resp: 18 17  Temp: 97.7 F (36.5 C) 98.2 F (  36.8 C)  SpO2: 99% 99%     General: Well developed, well nourished, NAD, appears stated age  HEENT: NCAT, mucous membranes moist.  Cardiovascular: S1 S2 auscultated, RRR, no murmur  Respiratory: Clear to auscultation bilaterally with equal chest rise  Abdomen: Soft, nontender, nondistended, + bowel sounds  Extremities: warm dry without cyanosis clubbing or edema. Right transmetatarsal amputation. Left foot with dressing in place, necrotic plantar aspect of the left great toe  Neuro: AAOx3, nonfocal  Psych: Appropriate mood and affect  Discharge Instructions  Allergies as of 11/24/2019      Reactions   Norvasc [amlodipine Besylate]    Penicillins    Childhood allergy, not sure what happens       Medication List    STOP taking these medications   gabapentin 600 MG tablet Commonly known as: NEURONTIN Replaced by: gabapentin 300 MG capsule     TAKE these medications   aspirin EC 81 MG tablet Take 81 mg by mouth daily. Notes to patient: Last dose given today at 8:16am   atorvastatin 40 MG tablet Commonly known as: LIPITOR Take 40 mg by mouth at bedtime. Notes to patient: Last dose given 10:15pm 11/23/19   betamethasone valerate 0.1 % cream Commonly known as: VALISONE Apply 1 application topically 2 (two) times daily as needed. Notes to patient: Not given in hospital   carvedilol 3.125 MG tablet Commonly known as: COREG Take 3.125 mg by mouth 2 (two) times daily with a meal. Notes to patient: Last dose given today at 8:16am   cetirizine 10 MG tablet Commonly known as: ZYRTEC Take 10 mg by mouth daily. Notes to patient: Not given in hospital   cyanocobalamin 1000 MCG/ML injection Commonly known as:  (VITAMIN B-12) Inject 1,000 mcg into the muscle every 30 (thirty) days. Notes to patient: Not given in hospital   cyclobenzaprine 10 MG tablet Commonly known as: FLEXERIL Take 10 mg by mouth 2 (two) times daily as needed for muscle spasms. Notes to patient: Not given in hospital   docusate sodium 50 MG capsule Commonly known as: COLACE Take 100 mg by mouth daily. Notes to patient: Last dose given today at 8:16am   Eliquis 5 MG Tabs tablet Generic drug: apixaban Take 5 mg by mouth 2 (two) times daily. Notes to patient: Last dose given today at 8:16am   empagliflozin 25 MG Tabs tablet Commonly known as: JARDIANCE Take 12.5 mg by mouth daily. Notes to patient: Not given in hospital   famotidine 20 MG tablet Commonly known as: PEPCID Take 20 mg by mouth 2 (two) times daily. Notes to patient: Last dose given today at 8:17am   feeding supplement (GLUCERNA SHAKE) Liqd Take 237 mLs by mouth 2 (two) times daily between meals. Notes to patient: Last dose given today at 8:18am   Ensure Max Protein Liqd Take 330 mLs (11 oz total) by mouth daily. Start taking on: November 25, 2019 Notes to patient: Last dose given today at 8:18am   fluticasone 50 MCG/ACT nasal spray Commonly known as: FLONASE Place 2 sprays into both nostrils daily. Notes to patient: Not given in hospital   gabapentin 300 MG capsule Commonly known as: NEURONTIN Take 1 capsule (300 mg total) by mouth 2 (two) times daily. Replaces: gabapentin 600 MG tablet Notes to patient: Last dose given today at 8:17am   glucose 4 GM chewable tablet Chew 4 tablets by mouth as needed for low blood sugar. Notes to patient: Not given in hospital   HYDROcodone-acetaminophen 5-325 MG  tablet Commonly known as: NORCO/VICODIN Take 1-2 tablets by mouth every 6 (six) hours as needed for severe pain.   lisinopril 2.5 MG tablet Commonly known as: ZESTRIL Take 2.5 mg by mouth daily. Notes to patient: Last dose given today at 8:16am    metFORMIN 500 MG tablet Commonly known as: GLUCOPHAGE Start 1 tab by mouth in am daily for 7 days. Then increase to 1 tab in am & pm for 7 days.  Continue weekly increase to 2 tabs twice daily. What changed:   how much to take  how to take this  when to take this  additional instructions Notes to patient: Not given in hospital   nitroGLYCERIN 0.4 MG SL tablet Commonly known as: NITROSTAT Place 0.4 mg under the tongue every 5 (five) minutes as needed for chest pain. Notes to patient: Not given in hospital   Semaglutide (1 MG/DOSE) 2 MG/1.5ML Sopn Inject 1.5 mg into the skin once a week. Notes to patient: Not given in hospital   senna 8.6 MG Tabs tablet Commonly known as: SENOKOT Take 1 tablet (8.6 mg total) by mouth 2 (two) times daily. Notes to patient: Last dose given today at 8:17am   senna-docusate 8.6-50 MG tablet Commonly known as: Senokot-S Take 1 tablet by mouth at bedtime as needed for mild constipation. Notes to patient: Not given in hospital   tamsulosin 0.4 MG Caps capsule Commonly known as: FLOMAX Take 0.4 mg by mouth daily. Notes to patient: Last dose given today at 8:17am   torsemide 20 MG tablet Commonly known as: DEMADEX Take 20 mg by mouth daily. Notes to patient: Last dose given today at 8:17am   traMADol 50 MG tablet Commonly known as: ULTRAM Take 1 tablet (50 mg total) by mouth every 6 (six) hours.   Vitamin D3 25 MCG (1000 UT) Caps Take 1,000 Units by mouth daily. Notes to patient: Not given in hospital            [place]  (From admission, onward)         Start     Ordered   11/22/19 1016  For home use only DME lightweight manual wheelchair with seat cushion  Once       Comments: Patient suffers from amputation and weakness which impairs their ability to perform daily activities like ADLs in the home.  A walking aid will not resolve  issue with performing activities of daily living. A wheelchair will allow  patient to safely perform daily activities. Patient is not able to propel themselves in the home using a standard weight wheelchair due to weakness. Patient can self propel in the lightweight wheelchair. Length of need 99 months Accessories: elevating leg rests Back cushion, wheel locks, extensions and anti-tippers.   11/22/19 1017   11/21/19 1128  For home use only DME Bedside commode  Once       Question Answer Comment  Patient needs a bedside commode to treat with the following condition Generalized weakness   Patient needs a bedside commode to treat with the following condition S/P right hip fracture      11/21/19 1127         Allergies  Allergen Reactions   Norvasc [Amlodipine Besylate]    Penicillins     Childhood allergy, not sure what happens     Follow-up Information    [place], MD. Schedule an appointment as soon as possible for a visit on 12/07/2019.   Specialty: Orthopedic Surgery Why: Make appointment  for 2 weeks to see Dr. Mack Guise prior to the patient's discharge at [facility] information: [place] [address] [phone]        [facility]. Schedule an appointment as soon as possible for a visit in 1 week(s).   Why: Hospital follow up Contact information: [place] [address] [phone]                The results of significant diagnostics from this hospitalization (including imaging, microbiology, ancillary and laboratory) are listed below for reference.    Significant Diagnostic Studies: DG Chest Portable 1 View  Result Date: 11/15/2019 CLINICAL DATA:  Preop EXAM: PORTABLE CHEST 1 VIEW COMPARISON:  March 11, 2019 FINDINGS: The heart size and mediastinal contours are within normal limits. Aortic knob calcifications are seen. A left-sided pacemaker seen with the lead tip in the right ventricle. Overlying median sternotomy wires are present. Both lungs are clear. The  visualized skeletal structures are unremarkable. IMPRESSION: No active disease. Electronically Signed   By: Prudencio Pair M.D.   On: 11/15/2019 00:56   ECHOCARDIOGRAM COMPLETE  Result Date: 11/15/2019    ECHOCARDIOGRAM REPORT   Patient Name:   Jason Montes Date of Exam: 11/15/2019 Medical Rec #:  [id number]           Height:       69.0 in Accession #:    [id number]          Weight:       179.9 lb Date of Birth:  1950-12-26           BSA:          1.975 m Patient Age:    62 years            BP:           115/60 mmHg Patient Gender: M                   HR:           89 bpm. Exam Location:  [facility] Procedure: 2D Echo, Color Doppler and Cardiac Doppler Indications:     CHF- acute diastolic 875.64  History:         Patient has no prior history of Echocardiogram examinations.                  Stroke; Risk Factors:Hypertension. Heart attack, PVD.  Sonographer:     Sherrie Sport RDCS (AE) Referring Phys:  [id number] Athena Masse Diagnosing Phys: Bartholome Bill MD  Sonographer Comments: Technically difficult study due to poor echo windows, no apical window, no subcostal window and suboptimal parasternal window. IMPRESSIONS  1. Left ventricular ejection fraction, by estimation, is 40 to 45%. The left ventricle has mildly decreased function. The left ventricle has no regional wall motion abnormalities. The left ventricular internal cavity size was mildly dilated. Left ventricular diastolic parameters were normal.  2. Right ventricular systolic function is normal. The right ventricular size is normal. There is moderately elevated pulmonary artery systolic pressure.  3. The mitral valve was not well visualized. Trivial mitral valve regurgitation.  4. The aortic valve was not well visualized. Aortic valve regurgitation is trivial. FINDINGS  Left Ventricle: Left ventricular ejection fraction, by estimation, is 40 to 45%. The left ventricle has mildly decreased function. The left ventricle has no regional wall motion abnormalities.  The left ventricular internal cavity size was mildly dilated. There is borderline left ventricular hypertrophy. Left  ventricular diastolic parameters were normal. Right Ventricle: The right ventricular size is normal. No increase in right ventricular wall thickness. Right ventricular systolic function is normal. There is moderately elevated pulmonary artery systolic pressure. The tricuspid regurgitant velocity is 3.03 m/s, and with an assumed right atrial pressure of 10 mmHg, the estimated right ventricular systolic pressure is 82.9 mmHg. Left Atrium: Left atrial size was normal in size. Right Atrium: Right atrial size was normal in size. Pericardium: There is no evidence of pericardial effusion. Mitral Valve: The mitral valve was not well visualized. Trivial mitral valve regurgitation. Tricuspid Valve: The tricuspid valve is not well visualized. Tricuspid valve regurgitation is trivial. Aortic Valve: The aortic valve was not well visualized. Aortic valve regurgitation is trivial. There is no evidence of aortic valve vegetation. Pulmonic Valve: The pulmonic valve was not well visualized. Pulmonic valve regurgitation is not visualized. Aorta: The aortic root was not well visualized. IAS/Shunts: The interatrial septum was not assessed. Additional Comments: A pacer wire is visualized.  LEFT VENTRICLE PLAX 2D LVIDd:         5.42 cm LVIDs:         4.36 cm LV PW:         1.06 cm LV IVS:        0.89 cm LVOT diam:     2.30 cm LVOT Area:     4.15 cm  LEFT ATRIUM         Index LA diam:    4.90 cm 2.48 cm/m   AORTA Ao Root diam: 3.20 cm TRICUSPID VALVE TR Peak grad:   36.7 mmHg TR Vmax:        303.00 cm/s  SHUNTS Systemic Diam: 2.30 cm Bartholome Bill MD Electronically signed by Bartholome Bill MD Signature Date/Time: 11/15/2019/12:16:04 PM    Final    DG HIP UNILAT WITH PELVIS 1V RIGHT  Result Date: 11/16/2019 CLINICAL DATA:  Postop EXAM: DG HIP (WITH OR WITHOUT PELVIS) 1V RIGHT COMPARISON:  11/15/2019 FINDINGS: Interval  intramedullary rod and distal screw fixation of the proximal right femur for intertrochanteric fracture. Anatomic alignment. Intact hardware. Gas in the soft tissues of the thigh and hip consistent with recent surgery. Pubic symphysis and rami are intact. Extensive vascular calcification. IMPRESSION: Interval intramedullary rod and screw fixation of proximal right femur for intertrochanteric fracture. Electronically Signed   By: Donavan Foil M.D.   On: 11/16/2019 15:45   DG HIP OPERATIVE UNILAT W OR W/O PELVIS RIGHT  Result Date: 11/16/2019 CLINICAL DATA:  Right hip surgery EXAM: OPERATIVE right HIP (WITH PELVIS IF PERFORMED) 21 VIEWS TECHNIQUE: Fluoroscopic spot image(s) were submitted for interpretation post-operatively. COMPARISON:  11/15/2019 FINDINGS: Twenty-one low resolution intraoperative spot views of the right hip. Total fluoroscopy time was 1 minutes 27 seconds. The initial images demonstrate normal alignment of right femur, intertrochanteric fracture difficult to visualize likely due to resolution. Subsequent images were obtained during operative course of intramedullary rod and distal screw fixation of the right femoral fracture. IMPRESSION: Intraoperative fluoroscopic assistance provided during surgical fixation of right femoral fracture. Electronically Signed   By: Donavan Foil M.D.   On: 11/16/2019 15:47   DG Hip Unilat W or Wo Pelvis 2-3 Views Right  Result Date: 11/15/2019 CLINICAL DATA:  Fall EXAM: DG HIP (WITH OR WITHOUT PELVIS) 2-3V RIGHT COMPARISON:  None. FINDINGS: There is a nondisplaced fracture involving the right intratrochanteric region extending through the lesser trochanter. There is also cortical step-off seen at the lateral corner of the femoral head neck  junction which could represent a nondisplaced fracture. There is diffuse osteopenia. Scattered dense vascular calcifications are noted. IMPRESSION: Nondisplaced right intratrochanteric fracture with overlying soft tissue  swelling. Electronically Signed   By: Prudencio Pair M.D.   On: 11/15/2019 00:21    Microbiology: Recent Results (from the past 240 hour(s))  SARS Coronavirus 2 by RT PCR (hospital order, performed in [facility] hospital lab) Nasopharyngeal Nasopharyngeal Swab     Status: None   Collection Time: 11/15/19 12:36 AM   Specimen: Nasopharyngeal Swab  Result Value Ref Range Status   SARS Coronavirus 2 NEGATIVE NEGATIVE Final    Comment: (NOTE) SARS-CoV-2 target nucleic acids are NOT DETECTED.  The SARS-CoV-2 RNA is generally detectable in upper and lower respiratory specimens during the acute phase of infection. The lowest concentration of SARS-CoV-2 viral copies this assay can detect is 250 copies / mL. A negative result does not preclude SARS-CoV-2 infection and should not be used as the sole basis for treatment or other patient management decisions.  A negative result may occur with improper specimen collection / handling, submission of specimen other than nasopharyngeal swab, presence of viral mutation(s) within the areas targeted by this assay, and inadequate number of viral copies (<250 copies / mL). A negative result must be combined with clinical observations, patient history, and epidemiological information.  Fact Sheet for Patients:   StrictlyIdeas.no  Fact Sheet for Healthcare Providers: BankingDealers.co.za  This test is not yet approved or  cleared by the [place] FDA and has been authorized for detection and/or diagnosis of SARS-CoV-2 by FDA under an Emergency Use Authorization (EUA).  This EUA will remain in effect (meaning this test can be used) for the duration of the COVID-19 declaration under Section 564(b)(1) of the Act, 21 U.S.C. section 360bbb-3(b)(1), unless the authorization is terminated or revoked sooner.  Performed at [facility], [address], [place], [place] [address]   Surgical pcr screen      Status: Abnormal   Collection Time: 11/15/19  4:05 AM   Specimen: Nasal Mucosa; Nasal Swab  Result Value Ref Range Status   MRSA, PCR POSITIVE (A) NEGATIVE Final    Comment: RESULT CALLED TO, READ BACK BY AND VERIFIED WITH: Jamse Belfast RN [phone] 11/15/19 HNM    Staphylococcus aureus POSITIVE (A) NEGATIVE Final    Comment: (NOTE) The Xpert SA Assay (FDA approved for NASAL specimens in patients 43 years of age and older), is one component of a comprehensive surveillance program. It is not intended to diagnose infection nor to guide or monitor treatment. Performed at [facility], [address] [place],  [address]      Labs: Basic Metabolic Panel: Recent Labs  Lab 11/19/19 0407 11/20/19 0427 11/21/19 0433 11/22/19 0528 11/23/19 0540  NA 132* 130* 133* 133* 136  K 5.1 4.5 4.3 4.1 4.2  CL 99 97* 100 98 102  CO2 23 24 26 25 26   GLUCOSE 251* 216* 211* 199* 145*  BUN 46* 47* 47* 40* 34*  CREATININE 1.64* 1.78* 1.65* 1.44* 1.39*  CALCIUM 8.8* 9.1 8.7* 8.8* 8.9   Liver Function Tests: No results for input(s): AST, ALT, ALKPHOS, BILITOT, PROT, ALBUMIN in the last 168 hours. No results for input(s): LIPASE, AMYLASE in the last 168 hours. No results for input(s): AMMONIA in the last 168 hours. CBC: Recent Labs  Lab 11/20/19 0427 11/21/19 0433 11/22/19 0528 11/23/19 0540 11/24/19 0517  WBC 6.2 6.4 6.6 7.0 6.6  HGB 7.0* 8.8* 9.0* 8.9* 9.2*  HCT 21.7* 25.9* 26.9* 27.5* 29.4*  MCV 88.6 86.6 88.2 91.4 91.9  PLT 166 126* 191 260 264   Cardiac Enzymes: No results for input(s): CKTOTAL, CKMB, CKMBINDEX, TROPONINI in the last 168 hours. BNP: BNP (last 3 results) No results for input(s): BNP in the last 8760 hours.  ProBNP (last 3 results) No results for input(s): PROBNP in the last 8760 hours.  CBG: Recent Labs  Lab 11/23/19 1212 11/23/19 1702 11/23/19 2107 11/24/19 0753 11/24/19 1204  GLUCAP 183* 253* 204* 175* 290*        Signed:  Derelle Cockrell  [facility] 11/24/2019, 2:36 PM

## 2019-11-24 NOTE — TOC Progression Note (Signed)
Transition of Care [facility]) - Progression Note    Patient Details  Name: Jason Montes MRN: [id number] Date of Birth: [birth year]  Transition of Care [facility]) CM/SW [place], RN Phone Number: 11/24/2019, 3:31 PM  Clinical Narrative:     The patient is to DC home to [place], [place] via EMS transport, The bedside nurse has reviewed his DC information, [facility] has been called they will transport the 3 in 1, The patient will have [facility] services and has an aide daily as well, he has meals on wheels set up He states that he is ready to go home        Expected Discharge Plan and Services           Expected Discharge Date: 11/24/19                                     Social Determinants of Health (SDOH) Interventions    Readmission Risk Interventions No flowsheet data found.

## 2019-11-24 NOTE — Discharge Instructions (Signed)
Femoral Shaft Fracture Treated With ORIF, Care After This sheet gives you information about how to care for yourself after your procedure. Your health care provider may also give you more specific instructions. If you have problems or questions, contact your health care provider. What can I expect after the procedure? After the procedure, it is common to have:  Pain.  Swelling.  Some redness or bruising around the incision.  A small amount of fluid or blood coming from your incision.  Stiffness in your leg. Follow these instructions at home: Medicines  Take over-the-counter and prescription medicines only as told by your health care provider. This includes medicines to prevent blood clots from forming.  Ask your health care provider if the medicine prescribed to you: ? Requires you to avoid driving or using heavy machinery. ? Can cause constipation. You may need to take these actions to prevent or treat constipation:  Drink enough fluid to keep your urine pale yellow.  Take over-the-counter or prescription medicines.  Eat foods that are high in fiber, such as beans, whole grains, and fresh fruits and vegetables.  Limit foods that are high in fat and processed sugars, such as fried or sweet foods. Bathing  Do not take baths, swim, or use a hot tub until your health care provider approves. Ask your health care provider if you may take showers. You may only be allowed to take sponge baths.  Keep your bandage (dressing) dry. Cover it with a waterproof covering if you take a sponge bath or shower. Incision care   Follow instructions from your health care provider about how to take care of your incision. Make sure you: ? Wash your hands with soap and water before and after you change your bandage. If soap and water are not available, use hand sanitizer. ? Change your dressing as told by your health care provider. ? Leave stitches (sutures), staples, or adhesive strips in place.  These skin closures may need to stay in place for 2 weeks or longer. If adhesive strip edges start to loosen and curl up, you may trim the loose edges. Do not remove adhesive strips completely unless your health care provider tells you to do that.  Check your incision area every day for signs of infection. Check for: ? More redness, swelling, or pain. ? More fluid or blood. ? Warmth. ? Pus or a bad smell. Managing pain, stiffness, and swelling   If directed, put ice on your leg. ? Put ice in a plastic bag. ? Place a towel between your skin or dressing and the bag. ? Leave the ice on for 20 minutes, 2-3 times a day.  Move your toes often to reduce stiffness and swelling.  Raise (elevate) your leg when sitting or lying down. Activity  Do not use the injured limb to support your body weight until your health care provider says that you can. Use crutches or a walker as told by your health care provider.  Do exercises as told by your health care provider.  Ask your health care provider when it is safe to drive.  Return to your normal activities as told by your health care provider. Ask your health care provider what activities are safe for you. General instructions  Do not use any products that contain nicotine or tobacco, such as cigarettes, e-cigarettes, and chewing tobacco. These can delay bone healing. If you need help quitting, ask your health care provider.  Keep all follow-up visits as told by your health   care provider. This is important. This includes visits for physical therapy. Physical therapy is an important part of recovery as complete healing may take 3 to 6 months. Contact a health care provider if:  You have chills or fever.  You have more redness, swelling, or pain around your incision.  You have more fluid or blood coming from your incision.  Your incision feels warm to touch.  You have pus or a bad smell coming from your incision.  You have more bruising  around your incision. Get help right away if:  You have warmth, redness, tenderness, and swelling develop in your calf or thigh.  You have chest pain or trouble breathing.  Your incision breaks open.  You have severe pain that does not get better with medicine. Summary  After this procedure, it is common to have pain, swelling, and stiffness.  Take over-the-counter and prescription medicines only as told by your health care provider. This includes medicines to prevent blood clots from forming.  Follow instructions from your health care provider about how to take care of your incision. Check your incision area every day for signs of infection.  Keep all follow-up visits as told by your health care provider. This is important. This includes visits for physical therapy. This information is not intended to replace advice given to you by your health care provider. Make sure you discuss any questions you have with your health care provider. Document Revised: 08/05/2018 Document Reviewed: 08/05/2018 Elsevier Patient Education  2020 [employer]  

## 2019-11-24 NOTE — Progress Notes (Signed)
Inpatient Diabetes Program Recommendations  AACE/ADA: New Consensus Statement on Inpatient Glycemic Control (2015)  Target Ranges:  Prepandial:   less than 140 mg/dL      Peak postprandial:   less than 180 mg/dL (1-2 hours)      Critically ill patients:  140 - 180 mg/dL   Lab Results  Component Value Date   GLUCAP 290 (H) 11/24/2019   HGBA1C 11.4 (H) 11/15/2019    Review of Glycemic Control Results for Jason Montes, Jason Montes (MRN [id number]) as of 11/24/2019 14:25  Ref. Range 11/23/2019 12:12 11/23/2019 17:02 11/23/2019 21:07 11/24/2019 07:53 11/24/2019 12:04  Glucose-Capillary Latest Ref Range: 70 - 99 mg/dL 183 (H) 253 (H) 204 (H) 175 (H) 290 (H)  Home DM Meds: Jardiance 12.5 mg Daily                             Metformin 500 mg BID                             Ozempic 1.5 mg Qweek  Current Orders: Lantus 5 units Daily                            Novolog Moderate Correction Scale/ SSI (0-15 units) TID AC + HS  Inpatient Diabetes Program Recommendations:   Consider increasing Lantus to 10 units daily and add Novolog meal coverage 3 units tid with meals (hold if patient eats less than 50%).   Thanks,  Adah Perl, RN, BC-ADM Inpatient Diabetes Coordinator Pager [phone] (8a-5p)

## 2019-12-23 ENCOUNTER — Other Ambulatory Visit: Payer: Self-pay | Admitting: Podiatry

## 2019-12-30 ENCOUNTER — Inpatient Hospital Stay: Admission: RE | Admit: 2019-12-30 | Payer: No Typology Code available for payment source | Source: Ambulatory Visit

## 2020-01-03 ENCOUNTER — Encounter
Admission: RE | Admit: 2020-01-03 | Discharge: 2020-01-03 | Disposition: A | Discharge status: Home / Self Care | Payer: Medicare Other | Source: Ambulatory Visit | Attending: Podiatry | Admitting: Podiatry

## 2020-01-03 ENCOUNTER — Other Ambulatory Visit: Payer: Self-pay

## 2020-01-03 NOTE — Pre-Procedure Instructions (Signed)
Copied from ED note 11/14/19 EKG interpretation  EKG  ED ECG REPORT I, Rudene Re, the attending physician, personally viewed and interpreted this ECG.  Normal sinus rhythm, rate of 87, occasional PVCs, normal axis, no ST elevations or depressions, T wave inversions in inferior lateral leads.  Unchanged from prior.

## 2020-01-03 NOTE — Patient Instructions (Addendum)
Your procedure is scheduled on: 01/13/20 Report to [place]. To find out your arrival time please call [phone] between 1PM - 3PM on 01/11/30.  Remember: Instructions that are not followed completely may result in serious medical risk, up to and including death, or upon the discretion of your surgeon and anesthesiologist your surgery may need to be rescheduled.     _X__ 1. Do not eat food after midnight the night before your procedure.                 No gum chewing or hard candies. You may drink clear liquids up to 2 hours                 before you are scheduled to arrive for your surgery- DO not drink clear                 liquids within 2 hours of the start of your surgery.                 Clear Liquids include:  water, apple juice without pulp, clear carbohydrate                 drink such as Clearfast or Gatorade, Black Coffee or Tea (Do not add                 anything to coffee or tea). Diabetics water only  __X__2.  On the morning of surgery brush your teeth with toothpaste and water, you                 may rinse your mouth with mouthwash if you wish.  Do not swallow any              toothpaste of mouthwash.     _X__ 3.  No Alcohol for 24 hours before or after surgery.   _X__ 4.  Do Not Smoke or use e-cigarettes For 24 Hours Prior to Your Surgery.                 Do not use any chewable tobacco products for at least 6 hours prior to                 surgery.  ____  5.  Bring all medications with you on the day of surgery if instructed.   __X__  6.  Notify your doctor if there is any change in your medical condition      (cold, fever, infections).     Do not wear jewelry, make-up, hairpins, clips or nail polish. Do not wear lotions, powders, or perfumes.  Do not shave 48 hours prior to surgery. Men may shave face and neck. Do not bring valuables to the hospital.    [facility] is not responsible for any belongings or  valuables.  Contacts, dentures/partials or body piercings may not be worn into surgery. Bring a case for your contacts, glasses or hearing aids, a denture cup will be supplied. Leave your suitcase in the car. After surgery it may be brought to your room. For patients admitted to the hospital, discharge time is determined by your treatment team.   Patients discharged the day of surgery will not be allowed to drive home.   Please read over the following fact sheets that you were given:   MRSA Information  __X__ Take these medicines the morning of surgery with A SIP OF WATER:  1. carvedilol (COREG) 3.125 MG tablet  2. cetirizine (ZYRTEC) 10 MG tablet  3. famotidine (PEPCID) 20 MG tablet  4. gabapentin (NEURONTIN) 300 MG capsule  5. tamsulosin (FLOMAX) 0.4 MG CAPS capsule  6.  ____ Fleet Enema (as directed)   __X__ Use CHG Soap/SAGE wipes as directed  ____ Use inhalers on the day of surgery  __X__ Stop metformin/Janumet/Farxiga 2 days prior to surgery    ____ Take 1/2 of usual insulin dose the night before surgery. No insulin the morning          of surgery.   __X__  Blood Thinners Coumadin/Plavix/Xarelto/Pleta/Pradaxa/Eliquis/Effient/Aspirin     contact your Surgeon, Cardiologist or Medical Doctor regarding  ability to stop your blood thinners  __X__ Stop Anti-inflammatories 7 days before surgery such as Advil, Ibuprofen, Motrin,  BC or Goodies Powder, Naprosyn, Naproxen, Aleve    __X__ Stop all herbal supplements, fish oil or vitamin E until after surgery.    ____ Bring C-Pap to the hospital.    YOU NEED TO [place] 2 HOURS BEFORE ARRIVING  BRING GUARDIANSHIP PAPERS AND/OR MEDICAL POWER OF ATTORNEY

## 2020-01-04 ENCOUNTER — Other Ambulatory Visit: Payer: No Typology Code available for payment source

## 2020-01-09 NOTE — Progress Notes (Signed)
  [facility] Perioperative Services: Pre-Admission/Anesthesia Testing     Date: 01/05/2020  Name: Jason Montes MRN:   [id number]  Re: VA clearance for upcoming surgery  I have made several attempts to [facility] without any luck. I have left several messages. Previously faxed documents resent today with an added "status ?" annotation. Will continue to work on attempting to obtain clearances and pacemakers orders from the [place].   Honor Loh, MSN, APRN, FNP-C, CEN [facility]  Peri-operative Services Nurse Practitioner Phone: [phone] 01/05/2020, 1402 PM

## 2020-01-09 NOTE — Progress Notes (Signed)
  [facility] Perioperative Services: Pre-Admission/Anesthesia Testing     Date: 01/03/2020  Name: Jason Montes MRN:   [id number]  Re: Clearance for surgery  Patient pending podiatric surgery with Dr. Luana Shu on 01/13/2020. Surgeon has requested medical and cardiac clearance. Call placed to surgeon's office by Lucia Gaskins, RN to advise that this is generally handled by performing surgeon, however we would be glad to make efforts to assist. Complicating the process is the fact that the patient is seen at the [facility], and I do not have names of his providers there. Generic fax sent to [facility] number listed on website Im and cardiac clearance, as well as completion of the perioperative RX for cardiac device programming form. Will keep in touch with [facility] podiatry regarding information received.   Honor Loh, MSN, APRN, FNP-C, CEN [facility]  Peri-operative Services Nurse Practitioner Phone: [phone] 01/03/2020, 1300 PM

## 2020-01-09 NOTE — Progress Notes (Signed)
  [facility] Perioperative Services: Pre-Admission/Anesthesia Testing     Date: 01/06/2020  Name: Jason Montes MRN:   [id number]  Re: Clearances for surgery  Call placed to [facility] surgery coordinator [facility]) to determine status of clearances. She had unfortunately left for the day. Detailed message left on her secure voicemail asking for [place] to touch base to follow up on any clearances that may have been received. Will plan on touching base with her on Monday of next week, as the patient's surgery is quickly approaching.   Honor Loh, MSN, APRN, FNP-C, CEN [facility]  Peri-operative Services Nurse Practitioner Phone: [phone] 01/06/2020, 1500 PM

## 2020-01-09 NOTE — Progress Notes (Addendum)
  [facility] Perioperative Services: Pre-Admission/Anesthesia Testing     Date: 01/09/20  Name: Jason Montes MRN:   [id number]  Re: Clearances for surgery  Received return call from [facility] regarding clearances for this patient. Dena advising that she received return form from patient's PCP this morning advising the following:  "Patient has comorbid conditions. Also has anemia (Hb 11). Needs cardiac clearance and DM in control of it is elective surgery. Patient is not medically stable/optimized for proposed surgery"  Dena plans on making the patient and his daughter aware. It is uncertain is patient has recently been seen by his cardiology provider or not. If not, it is doubtful that we will be able to get him in and cleared with a [facility] provider prior to his surgery date on 01/13/2020. Dena to continue to work on cardiac clearance, including pacemaker orders, in efforts to be able to proceed as planned. Coralyn Helling will return call to me when she has further information.   Honor Loh, MSN, APRN, FNP-C, CEN [facility]  Peri-operative Services Nurse Practitioner Phone: [phone] 01/09/20 12:27 PM

## 2020-01-11 ENCOUNTER — Other Ambulatory Visit: Payer: Self-pay

## 2020-01-11 ENCOUNTER — Emergency Department: Payer: Medicare Other

## 2020-01-11 ENCOUNTER — Other Ambulatory Visit: Admission: RE | Admit: 2020-01-11 | Payer: No Typology Code available for payment source | Source: Ambulatory Visit

## 2020-01-11 ENCOUNTER — Inpatient Hospital Stay
Admission: EM | Admit: 2020-01-11 | Discharge: 2020-02-03 | DRG: 907 | Disposition: A | Discharge status: Rehabilitation Facility | Payer: Medicare Other | Attending: Hospitalist | Admitting: Hospitalist

## 2020-01-11 ENCOUNTER — Encounter: Payer: Self-pay | Admitting: *Deleted

## 2020-01-11 DIAGNOSIS — G9341 Metabolic encephalopathy: Secondary | ICD-10-CM | POA: Diagnosis present

## 2020-01-11 DIAGNOSIS — T40422A Poisoning by tramadol, intentional self-harm, initial encounter: Secondary | ICD-10-CM | POA: Diagnosis not present

## 2020-01-11 DIAGNOSIS — I4891 Unspecified atrial fibrillation: Secondary | ICD-10-CM | POA: Diagnosis not present

## 2020-01-11 DIAGNOSIS — N189 Chronic kidney disease, unspecified: Secondary | ICD-10-CM

## 2020-01-11 DIAGNOSIS — Z800 Family history of malignant neoplasm of digestive organs: Secondary | ICD-10-CM

## 2020-01-11 DIAGNOSIS — I69311 Memory deficit following cerebral infarction: Secondary | ICD-10-CM

## 2020-01-11 DIAGNOSIS — N179 Acute kidney failure, unspecified: Secondary | ICD-10-CM

## 2020-01-11 DIAGNOSIS — E875 Hyperkalemia: Secondary | ICD-10-CM | POA: Diagnosis present

## 2020-01-11 DIAGNOSIS — K219 Gastro-esophageal reflux disease without esophagitis: Secondary | ICD-10-CM | POA: Diagnosis present

## 2020-01-11 DIAGNOSIS — I493 Ventricular premature depolarization: Secondary | ICD-10-CM | POA: Diagnosis not present

## 2020-01-11 DIAGNOSIS — R5381 Other malaise: Secondary | ICD-10-CM | POA: Diagnosis present

## 2020-01-11 DIAGNOSIS — I639 Cerebral infarction, unspecified: Secondary | ICD-10-CM | POA: Diagnosis present

## 2020-01-11 DIAGNOSIS — Z8674 Personal history of sudden cardiac arrest: Secondary | ICD-10-CM

## 2020-01-11 DIAGNOSIS — M869 Osteomyelitis, unspecified: Secondary | ICD-10-CM | POA: Diagnosis present

## 2020-01-11 DIAGNOSIS — E512 Wernicke's encephalopathy: Secondary | ICD-10-CM

## 2020-01-11 DIAGNOSIS — N1831 Chronic kidney disease, stage 3a: Secondary | ICD-10-CM | POA: Diagnosis present

## 2020-01-11 DIAGNOSIS — J690 Pneumonitis due to inhalation of food and vomit: Secondary | ICD-10-CM

## 2020-01-11 DIAGNOSIS — M86272 Subacute osteomyelitis, left ankle and foot: Secondary | ICD-10-CM | POA: Diagnosis present

## 2020-01-11 DIAGNOSIS — Z8349 Family history of other endocrine, nutritional and metabolic diseases: Secondary | ICD-10-CM

## 2020-01-11 DIAGNOSIS — I959 Hypotension, unspecified: Secondary | ICD-10-CM

## 2020-01-11 DIAGNOSIS — I130 Hypertensive heart and chronic kidney disease with heart failure and stage 1 through stage 4 chronic kidney disease, or unspecified chronic kidney disease: Secondary | ICD-10-CM | POA: Diagnosis present

## 2020-01-11 DIAGNOSIS — E1129 Type 2 diabetes mellitus with other diabetic kidney complication: Secondary | ICD-10-CM | POA: Diagnosis present

## 2020-01-11 DIAGNOSIS — I4729 Other ventricular tachycardia: Secondary | ICD-10-CM

## 2020-01-11 DIAGNOSIS — I252 Old myocardial infarction: Secondary | ICD-10-CM

## 2020-01-11 DIAGNOSIS — R778 Other specified abnormalities of plasma proteins: Secondary | ICD-10-CM | POA: Diagnosis not present

## 2020-01-11 DIAGNOSIS — Z6825 Body mass index (BMI) 25.0-25.9, adult: Secondary | ICD-10-CM

## 2020-01-11 DIAGNOSIS — E1122 Type 2 diabetes mellitus with diabetic chronic kidney disease: Secondary | ICD-10-CM | POA: Diagnosis present

## 2020-01-11 DIAGNOSIS — Z89431 Acquired absence of right foot: Secondary | ICD-10-CM

## 2020-01-11 DIAGNOSIS — R471 Dysarthria and anarthria: Secondary | ICD-10-CM | POA: Diagnosis present

## 2020-01-11 DIAGNOSIS — G8929 Other chronic pain: Secondary | ICD-10-CM | POA: Diagnosis present

## 2020-01-11 DIAGNOSIS — I5022 Chronic systolic (congestive) heart failure: Secondary | ICD-10-CM | POA: Diagnosis present

## 2020-01-11 DIAGNOSIS — F341 Dysthymic disorder: Secondary | ICD-10-CM | POA: Diagnosis present

## 2020-01-11 DIAGNOSIS — I10 Essential (primary) hypertension: Secondary | ICD-10-CM | POA: Diagnosis present

## 2020-01-11 DIAGNOSIS — L8990 Pressure ulcer of unspecified site, unspecified stage: Secondary | ICD-10-CM | POA: Insufficient documentation

## 2020-01-11 DIAGNOSIS — Z79899 Other long term (current) drug therapy: Secondary | ICD-10-CM

## 2020-01-11 DIAGNOSIS — E11649 Type 2 diabetes mellitus with hypoglycemia without coma: Secondary | ICD-10-CM | POA: Diagnosis not present

## 2020-01-11 DIAGNOSIS — Z951 Presence of aortocoronary bypass graft: Secondary | ICD-10-CM

## 2020-01-11 DIAGNOSIS — Z008 Encounter for other general examination: Secondary | ICD-10-CM

## 2020-01-11 DIAGNOSIS — E43 Unspecified severe protein-calorie malnutrition: Secondary | ICD-10-CM | POA: Diagnosis present

## 2020-01-11 DIAGNOSIS — Y92009 Unspecified place in unspecified non-institutional (private) residence as the place of occurrence of the external cause: Secondary | ICD-10-CM

## 2020-01-11 DIAGNOSIS — Z66 Do not resuscitate: Secondary | ICD-10-CM | POA: Diagnosis present

## 2020-01-11 DIAGNOSIS — Z8249 Family history of ischemic heart disease and other diseases of the circulatory system: Secondary | ICD-10-CM

## 2020-01-11 DIAGNOSIS — E1169 Type 2 diabetes mellitus with other specified complication: Secondary | ICD-10-CM | POA: Diagnosis present

## 2020-01-11 DIAGNOSIS — E1142 Type 2 diabetes mellitus with diabetic polyneuropathy: Secondary | ICD-10-CM | POA: Diagnosis present

## 2020-01-11 DIAGNOSIS — I429 Cardiomyopathy, unspecified: Secondary | ICD-10-CM | POA: Diagnosis present

## 2020-01-11 DIAGNOSIS — K5900 Constipation, unspecified: Secondary | ICD-10-CM | POA: Diagnosis not present

## 2020-01-11 DIAGNOSIS — F332 Major depressive disorder, recurrent severe without psychotic features: Secondary | ICD-10-CM | POA: Diagnosis present

## 2020-01-11 DIAGNOSIS — R338 Other retention of urine: Secondary | ICD-10-CM

## 2020-01-11 DIAGNOSIS — L89152 Pressure ulcer of sacral region, stage 2: Secondary | ICD-10-CM | POA: Clinically undetermined

## 2020-01-11 DIAGNOSIS — Z7982 Long term (current) use of aspirin: Secondary | ICD-10-CM

## 2020-01-11 DIAGNOSIS — E785 Hyperlipidemia, unspecified: Secondary | ICD-10-CM | POA: Diagnosis present

## 2020-01-11 DIAGNOSIS — F329 Major depressive disorder, single episode, unspecified: Secondary | ICD-10-CM | POA: Diagnosis present

## 2020-01-11 DIAGNOSIS — H53461 Homonymous bilateral field defects, right side: Secondary | ICD-10-CM | POA: Diagnosis present

## 2020-01-11 DIAGNOSIS — R109 Unspecified abdominal pain: Secondary | ICD-10-CM

## 2020-01-11 DIAGNOSIS — T426X2A Poisoning by other antiepileptic and sedative-hypnotic drugs, intentional self-harm, initial encounter: Secondary | ICD-10-CM | POA: Diagnosis present

## 2020-01-11 DIAGNOSIS — E870 Hyperosmolality and hypernatremia: Secondary | ICD-10-CM | POA: Diagnosis not present

## 2020-01-11 DIAGNOSIS — Z20822 Contact with and (suspected) exposure to covid-19: Secondary | ICD-10-CM | POA: Diagnosis present

## 2020-01-11 DIAGNOSIS — Z7901 Long term (current) use of anticoagulants: Secondary | ICD-10-CM

## 2020-01-11 DIAGNOSIS — E1165 Type 2 diabetes mellitus with hyperglycemia: Secondary | ICD-10-CM | POA: Diagnosis not present

## 2020-01-11 DIAGNOSIS — F32A Depression, unspecified: Secondary | ICD-10-CM | POA: Diagnosis present

## 2020-01-11 DIAGNOSIS — E872 Acidosis: Secondary | ICD-10-CM | POA: Diagnosis not present

## 2020-01-11 DIAGNOSIS — T481X2A Poisoning by skeletal muscle relaxants [neuromuscular blocking agents], intentional self-harm, initial encounter: Secondary | ICD-10-CM | POA: Diagnosis present

## 2020-01-11 DIAGNOSIS — I69351 Hemiplegia and hemiparesis following cerebral infarction affecting right dominant side: Secondary | ICD-10-CM

## 2020-01-11 DIAGNOSIS — E1152 Type 2 diabetes mellitus with diabetic peripheral angiopathy with gangrene: Secondary | ICD-10-CM | POA: Diagnosis present

## 2020-01-11 DIAGNOSIS — I2510 Atherosclerotic heart disease of native coronary artery without angina pectoris: Secondary | ICD-10-CM | POA: Diagnosis present

## 2020-01-11 DIAGNOSIS — G931 Anoxic brain damage, not elsewhere classified: Secondary | ICD-10-CM | POA: Diagnosis present

## 2020-01-11 DIAGNOSIS — J189 Pneumonia, unspecified organism: Secondary | ICD-10-CM

## 2020-01-11 DIAGNOSIS — F419 Anxiety disorder, unspecified: Secondary | ICD-10-CM | POA: Diagnosis present

## 2020-01-11 DIAGNOSIS — E11621 Type 2 diabetes mellitus with foot ulcer: Secondary | ICD-10-CM | POA: Diagnosis present

## 2020-01-11 DIAGNOSIS — L97529 Non-pressure chronic ulcer of other part of left foot with unspecified severity: Secondary | ICD-10-CM | POA: Diagnosis present

## 2020-01-11 DIAGNOSIS — G2581 Restless legs syndrome: Secondary | ICD-10-CM | POA: Diagnosis present

## 2020-01-11 DIAGNOSIS — F411 Generalized anxiety disorder: Secondary | ICD-10-CM | POA: Diagnosis present

## 2020-01-11 DIAGNOSIS — I70262 Atherosclerosis of native arteries of extremities with gangrene, left leg: Secondary | ICD-10-CM | POA: Diagnosis present

## 2020-01-11 DIAGNOSIS — Z95810 Presence of automatic (implantable) cardiac defibrillator: Secondary | ICD-10-CM

## 2020-01-11 DIAGNOSIS — R4589 Other symptoms and signs involving emotional state: Secondary | ICD-10-CM | POA: Diagnosis present

## 2020-01-11 DIAGNOSIS — Z7984 Long term (current) use of oral hypoglycemic drugs: Secondary | ICD-10-CM

## 2020-01-11 DIAGNOSIS — Z833 Family history of diabetes mellitus: Secondary | ICD-10-CM

## 2020-01-11 DIAGNOSIS — R4781 Slurred speech: Secondary | ICD-10-CM | POA: Diagnosis not present

## 2020-01-11 DIAGNOSIS — K2100 Gastro-esophageal reflux disease with esophagitis, without bleeding: Secondary | ICD-10-CM | POA: Diagnosis present

## 2020-01-11 DIAGNOSIS — N400 Enlarged prostate without lower urinary tract symptoms: Secondary | ICD-10-CM | POA: Diagnosis present

## 2020-01-11 DIAGNOSIS — I472 Ventricular tachycardia: Secondary | ICD-10-CM | POA: Diagnosis not present

## 2020-01-11 DIAGNOSIS — I480 Paroxysmal atrial fibrillation: Secondary | ICD-10-CM | POA: Diagnosis present

## 2020-01-11 DIAGNOSIS — D6959 Other secondary thrombocytopenia: Secondary | ICD-10-CM | POA: Diagnosis not present

## 2020-01-11 DIAGNOSIS — Z09 Encounter for follow-up examination after completed treatment for conditions other than malignant neoplasm: Secondary | ICD-10-CM

## 2020-01-11 LAB — BLOOD GAS, VENOUS
Acid-base deficit: 0.7 mmol/L (ref 0.0–2.0)
Bicarbonate: 24.9 mmol/L (ref 20.0–28.0)
O2 Saturation: 73.9 %
Patient temperature: 37
pCO2, Ven: 44 mmHg (ref 44.0–60.0)
pH, Ven: 7.36 (ref 7.250–7.430)
pO2, Ven: 41 mmHg (ref 32.0–45.0)

## 2020-01-11 LAB — GLUCOSE, CAPILLARY
Glucose-Capillary: 99 mg/dL (ref 70–99)
Glucose-Capillary: 89 mg/dL (ref 70–99)
Glucose-Capillary: 99 mg/dL (ref 70–99)

## 2020-01-11 LAB — COMPREHENSIVE METABOLIC PANEL
ALT: 9 U/L (ref 0–44)
AST: 29 U/L (ref 15–41)
Albumin: 4 g/dL (ref 3.5–5.0)
Alkaline Phosphatase: 82 U/L (ref 38–126)
Anion gap: 12 (ref 5–15)
BUN: 34 mg/dL — ABNORMAL HIGH (ref 8–23)
CO2: 21 mmol/L — ABNORMAL LOW (ref 22–32)
Calcium: 9.6 mg/dL (ref 8.9–10.3)
Chloride: 101 mmol/L (ref 98–111)
Creatinine, Ser: 1.59 mg/dL — ABNORMAL HIGH (ref 0.61–1.24)
GFR calc Af Amer: 51 mL/min — ABNORMAL LOW (ref >60)
GFR calc non Af Amer: 44 mL/min — ABNORMAL LOW (ref >60)
Glucose, Bld: 115 mg/dL — ABNORMAL HIGH (ref 70–99)
Potassium: 5.1 mmol/L (ref 3.5–5.1)
Sodium: 134 mmol/L — ABNORMAL LOW (ref 135–145)
Total Bilirubin: 1.4 mg/dL — ABNORMAL HIGH (ref 0.3–1.2)
Total Protein: 9.4 g/dL — ABNORMAL HIGH (ref 6.5–8.1)

## 2020-01-11 LAB — CBC
HCT: 36.2 % — ABNORMAL LOW (ref 39.0–52.0)
Hemoglobin: 12 g/dL — ABNORMAL LOW (ref 13.0–17.0)
MCH: 29.2 pg (ref 26.0–34.0)
MCHC: 33.1 g/dL (ref 30.0–36.0)
MCV: 88.1 fL (ref 80.0–100.0)
Platelets: UNDETERMINED 10*3/uL (ref 150–400)
RBC: 4.11 MIL/uL — ABNORMAL LOW (ref 4.22–5.81)
RDW: 14.9 % (ref 11.5–15.5)
WBC: 6.7 10*3/uL (ref 4.0–10.5)
nRBC: 0 % (ref 0.0–0.2)

## 2020-01-11 LAB — BRAIN NATRIURETIC PEPTIDE: B Natriuretic Peptide: 316.1 pg/mL — ABNORMAL HIGH (ref 0.0–100.0)

## 2020-01-11 LAB — PROTIME-INR
INR: 1.3 — ABNORMAL HIGH (ref 0.8–1.2)
Prothrombin Time: 15.2 seconds (ref 11.4–15.2)

## 2020-01-11 LAB — TYPE AND SCREEN
ABO/RH(D): O POS
Antibody Screen: NEGATIVE

## 2020-01-11 LAB — APTT: aPTT: 36 seconds (ref 24–36)

## 2020-01-11 LAB — SARS CORONAVIRUS 2 BY RT PCR (HOSPITAL ORDER, PERFORMED IN ~~LOC~~ HOSPITAL LAB): SARS Coronavirus 2: NEGATIVE

## 2020-01-11 LAB — C-REACTIVE PROTEIN: CRP: 2.6 mg/dL — ABNORMAL HIGH (ref <1.0)

## 2020-01-11 LAB — SEDIMENTATION RATE: Sed Rate: 83 mm/hr — ABNORMAL HIGH (ref 0–20)

## 2020-01-11 LAB — LACTIC ACID, PLASMA
Lactic Acid, Venous: 1.2 mmol/L (ref 0.5–1.9)
Lactic Acid, Venous: 1 mmol/L (ref 0.5–1.9)

## 2020-01-11 MED ORDER — ATORVASTATIN CALCIUM 20 MG PO TABS
40.0000 mg | ORAL_TABLET | Freq: Every day | ORAL | Status: DC
  Administered 2020-01-12 – 2020-02-02 (×20): 40 mg via ORAL
  Filled 2020-01-11 (×22): qty 2

## 2020-01-11 MED ORDER — CARVEDILOL 6.25 MG PO TABS
3.1250 mg | ORAL_TABLET | Freq: Two times a day (BID) | ORAL | Status: DC
  Administered 2020-01-12 – 2020-01-17 (×9): 3.125 mg via ORAL
  Filled 2020-01-11 (×12): qty 1

## 2020-01-11 MED ORDER — FLUTICASONE PROPIONATE 50 MCG/ACT NA SUSP
2.0000 | Freq: Every day | NASAL | Status: DC
  Administered 2020-01-13 – 2020-02-03 (×20): 2 via NASAL
  Filled 2020-01-11 (×3): qty 16

## 2020-01-11 MED ORDER — ACETAMINOPHEN 650 MG RE SUPP: 650.0000 mg | Freq: Four times a day (QID) | RECTAL | Status: DC | PRN

## 2020-01-11 MED ORDER — FAMOTIDINE 20 MG PO TABS
20.0000 mg | ORAL_TABLET | Freq: Every day | ORAL | Status: DC
  Administered 2020-01-12 – 2020-02-03 (×23): 20 mg via ORAL
  Filled 2020-01-11 (×24): qty 1

## 2020-01-11 MED ORDER — INSULIN ASPART 100 UNIT/ML ~~LOC~~ SOLN
0.0000 [IU] | Freq: Three times a day (TID) | SUBCUTANEOUS | Status: DC
  Administered 2020-01-13 (×2): 2 [IU] via SUBCUTANEOUS
  Administered 2020-01-14: 1 [IU] via SUBCUTANEOUS
  Administered 2020-01-14: 9 [IU] via SUBCUTANEOUS
  Administered 2020-01-14: 3 [IU] via SUBCUTANEOUS
  Administered 2020-01-15: 7 [IU] via SUBCUTANEOUS
  Administered 2020-01-15: 3 [IU] via SUBCUTANEOUS
  Administered 2020-01-15 – 2020-01-16 (×2): 5 [IU] via SUBCUTANEOUS
  Administered 2020-01-16 (×2): 3 [IU] via SUBCUTANEOUS
  Administered 2020-01-17 (×3): 2 [IU] via SUBCUTANEOUS
  Filled 2020-01-11 (×14): qty 1

## 2020-01-11 MED ORDER — INSULIN ASPART 100 UNIT/ML ~~LOC~~ SOLN
0.0000 [IU] | Freq: Every day | SUBCUTANEOUS | Status: DC
  Administered 2020-01-12: 1 [IU] via SUBCUTANEOUS
  Administered 2020-01-13: 2 [IU] via SUBCUTANEOUS
  Administered 2020-01-14: 3 [IU] via SUBCUTANEOUS
  Administered 2020-01-15 – 2020-01-16 (×2): 2 [IU] via SUBCUTANEOUS
  Filled 2020-01-11 (×5): qty 1

## 2020-01-11 MED ORDER — HYDRALAZINE HCL 20 MG/ML IJ SOLN: 5.0000 mg | INTRAMUSCULAR | Status: DC | PRN

## 2020-01-11 MED ORDER — LISINOPRIL 2.5 MG PO TABS
2.5000 mg | ORAL_TABLET | Freq: Every day | ORAL | Status: DC
  Administered 2020-01-12 – 2020-01-14 (×3): 2.5 mg via ORAL
  Filled 2020-01-11 (×3): qty 1

## 2020-01-11 MED ORDER — SENNA 8.6 MG PO TABS: 2.0000 | ORAL_TABLET | Freq: Every day | ORAL | Status: DC | PRN

## 2020-01-11 MED ORDER — ACETAMINOPHEN 325 MG PO TABS
650.0000 mg | ORAL_TABLET | Freq: Four times a day (QID) | ORAL | Status: DC | PRN
  Administered 2020-01-12 – 2020-01-18 (×3): 650 mg via ORAL
  Filled 2020-01-11 (×3): qty 2

## 2020-01-11 MED ORDER — TORSEMIDE 20 MG PO TABS
20.0000 mg | ORAL_TABLET | Freq: Every day | ORAL | Status: DC
  Administered 2020-01-12 – 2020-01-14 (×3): 20 mg via ORAL
  Filled 2020-01-11 (×3): qty 1

## 2020-01-11 MED ORDER — TAMSULOSIN HCL 0.4 MG PO CAPS
0.4000 mg | ORAL_CAPSULE | Freq: Every day | ORAL | Status: DC
  Administered 2020-01-12 – 2020-02-03 (×23): 0.4 mg via ORAL
  Filled 2020-01-11 (×25): qty 1

## 2020-01-11 MED ORDER — VITAMIN D 25 MCG (1000 UNIT) PO TABS
1000.0000 [IU] | ORAL_TABLET | Freq: Every day | ORAL | Status: DC
  Administered 2020-01-12 – 2020-02-03 (×23): 1000 [IU] via ORAL
  Filled 2020-01-11 (×24): qty 1

## 2020-01-11 MED ORDER — NITROGLYCERIN 0.4 MG SL SUBL: 0.4000 mg | SUBLINGUAL_TABLET | SUBLINGUAL | Status: DC | PRN

## 2020-01-11 MED ORDER — ASPIRIN EC 81 MG PO TBEC
81.0000 mg | DELAYED_RELEASE_TABLET | Freq: Every day | ORAL | Status: DC
  Administered 2020-01-12 – 2020-02-03 (×23): 81 mg via ORAL
  Filled 2020-01-11 (×24): qty 1

## 2020-01-11 MED ORDER — ONDANSETRON HCL 4 MG/2ML IJ SOLN
4.0000 mg | Freq: Three times a day (TID) | INTRAMUSCULAR | Status: DC | PRN
  Administered 2020-02-02: 4 mg via INTRAVENOUS
  Filled 2020-01-11: qty 2

## 2020-01-11 NOTE — Consult Note (Addendum)
PODIATRY / FOOT AND ANKLE SURGERY CONSULTATION NOTE  Requesting Physician: Duffy Bruce, MD  Reason for consult: L toe ulcer/infecttion  Chief Complaint: L toe ulcer, overmedicated/overdose   HPI: Jason Montes is a 69 y.o. male who presents resting in bed comfortably today.  Patient presented to [facility] via EMS due to altered mental status.  Patient was found by his daughter this morning and was said to be confused and altered and he was found next to a bottle of Flexeril and tramadol with more than expected missing.  It is unclear how much she actually took.  Patient has no history of intentional overdose.  Patient does have a history of chronic pain.  Patient is well-known to me and has been seen in clinic for the left toe wound numerous times and in the hospital as well.  Patient is scheduled for procedure for partial left first ray amputation on 8/13.  Patient did not have cardiology clearance and needed it prior to surgery.  Patient has slurred speech and is difficult to understand today.  PMHx:  Past Medical History:  Diagnosis Date  . Allergy   . Atrial fibrillation ([place])   . B12 deficiency   . Chronic kidney disease   . Chronic kidney disease (CKD), stage II (mild)   . Coronary atherosclerosis   . Diabetes mellitus without complication ([place])   . Elevated PSA   . Esophagitis   . GERD (gastroesophageal reflux disease)   . Heart attack ([place])   . Heart disease   . Hyperlipidemia   . Hypertension   . Hypokalemia   . Mild neurocognitive disorder   . Osteoarthritis   . Presbyopia   . PVD (peripheral vascular disease) ([place])   . Restless leg syndrome   . Stroke (cerebrum) ([place])   . Stroke ([place])   . Subacute osteomyelitis ([place])   . Tinea unguium   . Uncompensated short term memory deficit     Surgical Hx:  Past Surgical History:  Procedure Laterality Date  . CARDIAC PACEMAKER PLACEMENT    . COLONOSCOPY WITH PROPOFOL N/A 05/18/2018    Procedure: COLONOSCOPY WITH PROPOFOL;  Surgeon: [name], MD;  Location: [facility] ENDOSCOPY;  Service: Endoscopy;  Laterality: N/A;  . CORONARY ARTERY BYPASS GRAFT    . FOOT AMPUTATION Right   . INTRAMEDULLARY (IM) NAIL INTERTROCHANTERIC Right 11/16/2019   Procedure: INTRAMEDULLARY (IM) NAIL INTERTROCHANTRIC;  Surgeon: Thornton Park, MD;  Location: [facility] ORS;  Service: Orthopedics;  Laterality: Right;  . LOWER EXTREMITY ANGIOGRAPHY Left 10/10/2019   Procedure: Lower Extremity Angiography;  Surgeon: Algernon Huxley, MD;  Location: [facility] LAB;  Service: Cardiovascular;  Laterality: Left;    FHx:  Family History  Problem Relation Age of Onset  . Heart failure Mother   . Colon cancer Mother   . Diabetes Mother   . Thyroid disease Sister     Social History:  reports that he has never smoked. He has never used smokeless tobacco. He reports that he does not drink alcohol and does not use drugs.  Allergies:  Allergies  Allergen Reactions  . Amlodipine Other (See Comments)  . Norvasc [Amlodipine Besylate]     Unknown  . Penicillins     Childhood allergy, not sure what happens     Review of Systems: Unable to assess due to patient's mental state.  (Not in a hospital admission)   Physical Exam: General: Alert and oriented.  No apparent distress.  Vascular: Nonpalpable DP/PT pulses bilateral.  Capillary fill time appears to be intact to all digits of the left foot except for the distal tip of the left big toe.  No hair growth noted to digits or foot bilateral.  Neuro: Light touch sensation reduced to bilateral lower extremities.  Derm: Left hallux IPJ plantar medial ulceration with bone exposed but wound bed appears to be 100% granular with minimal to no areas of fibrosis, no ischemic changes are noted to the level of the wound.  Skin appears to be thin and atrophic to bilateral lower extremities.  MSK: Right transmetatarsal amputation.    Results for orders placed or  performed during the hospital encounter of 01/11/20 (from the past 48 hour(s))  SARS Coronavirus 2 by RT PCR (hospital order, performed in [facility] hospital lab) Nasopharyngeal Nasopharyngeal Swab     Status: None   Collection Time: 01/11/20  6:55 AM   Specimen: Nasopharyngeal Swab  Result Value Ref Range   SARS Coronavirus 2 NEGATIVE NEGATIVE    Comment: (NOTE) SARS-CoV-2 target nucleic acids are NOT DETECTED.  The SARS-CoV-2 RNA is generally detectable in upper and lower respiratory specimens during the acute phase of infection. The lowest concentration of SARS-CoV-2 viral copies this assay can detect is 250 copies / mL. A negative result does not preclude SARS-CoV-2 infection and should not be used as the sole basis for treatment or other patient management decisions.  A negative result may occur with improper specimen collection / handling, submission of specimen other than nasopharyngeal swab, presence of viral mutation(s) within the areas targeted by this assay, and inadequate number of viral copies (<250 copies / mL). A negative result must be combined with clinical observations, patient history, and epidemiological information.  Fact Sheet for Patients:   StrictlyIdeas.no  Fact Sheet for Healthcare Providers: BankingDealers.co.za  This test is not yet approved or  cleared by the [place] FDA and has been authorized for detection and/or diagnosis of SARS-CoV-2 by FDA under an Emergency Use Authorization (EUA).  This EUA will remain in effect (meaning this test can be used) for the duration of the COVID-19 declaration under Section 564(b)(1) of the Act, 21 U.S.C. section 360bbb-3(b)(1), unless the authorization is terminated or revoked sooner.  Performed at [facility], [address] [place], [place] [address]   Comprehensive metabolic panel     Status: Abnormal   Collection Time: 01/11/20  6:56 AM   Result Value Ref Range   Sodium 134 (L) 135 - 145 mmol/L   Potassium 5.1 3.5 - 5.1 mmol/L    Comment: HEMOLYSIS AT THIS LEVEL MAY AFFECT RESULT   Chloride 101 98 - 111 mmol/L   CO2 21 (L) 22 - 32 mmol/L   Glucose, Bld 115 (H) 70 - 99 mg/dL    Comment: Glucose reference range applies only to samples taken after fasting for at least 8 hours.   BUN 34 (H) 8 - 23 mg/dL   Creatinine, Ser 1.59 (H) 0.61 - 1.24 mg/dL   Calcium 9.6 8.9 - 10.3 mg/dL   Total Protein 9.4 (H) 6.5 - 8.1 g/dL   Albumin 4.0 3.5 - 5.0 g/dL   AST 29 15 - 41 U/L    Comment: HEMOLYSIS AT THIS LEVEL MAY AFFECT RESULT   ALT 9 0 - 44 U/L    Comment: HEMOLYSIS AT THIS LEVEL MAY AFFECT RESULT   Alkaline Phosphatase 82 38 - 126 U/L   Total Bilirubin 1.4 (H) 0.3 - 1.2 mg/dL    Comment: HEMOLYSIS AT THIS  LEVEL MAY AFFECT RESULT   GFR calc non Af Amer 44 (L) >60 mL/min   GFR calc Af Amer 51 (L) >60 mL/min   Anion gap 12 5 - 15    Comment: Performed at [facility], [address] [place], [place] [address]  CBC     Status: Abnormal   Collection Time: 01/11/20  6:56 AM  Result Value Ref Range   WBC 6.7 4.0 - 10.5 K/uL   RBC 4.11 (L) 4.22 - 5.81 MIL/uL   Hemoglobin 12.0 (L) 13.0 - 17.0 g/dL   HCT 36.2 (L) 39 - 52 %   MCV 88.1 80.0 - 100.0 fL   MCH 29.2 26.0 - 34.0 pg   MCHC 33.1 30.0 - 36.0 g/dL   RDW 14.9 11.5 - 15.5 %   Platelets PLATELET CLUMPS NOTED ON SMEAR, UNABLE TO ESTIMATE 150 - 400 K/uL    Comment: Immature Platelet Fraction may be clinically indicated, consider ordering this additional test [id number]    nRBC 0.0 0.0 - 0.2 %    Comment: Performed at [facility], [address] [place], [place] [address]  Blood gas, venous     Status: None   Collection Time: 01/11/20  7:09 AM  Result Value Ref Range   pH, Ven 7.36 7.25 - 7.43   pCO2, Ven 44 44 - 60 mmHg   pO2, Ven 41.0 32 - 45 mmHg   Bicarbonate 24.9 20.0 - 28.0 mmol/L   Acid-base deficit 0.7 0.0 - 2.0 mmol/L   O2 Saturation 73.9  %   Patient temperature 37.0    Collection site VEIN    Sample type VEIN     Comment: Performed at [facility], [address] [place], [place] [address]  Sedimentation rate     Status: Abnormal   Collection Time: 01/11/20  7:11 AM  Result Value Ref Range   Sed Rate 83 (H) 0 - 20 mm/hr    Comment: Performed at [facility], [address] [place], [place] [address]  Lactic acid, plasma     Status: None   Collection Time: 01/11/20  7:39 AM  Result Value Ref Range   Lactic Acid, Venous 1.2 0.5 - 1.9 mmol/L    Comment: Performed at [facility], [address] [place], [place] [address]  Brain natriuretic peptide     Status: Abnormal   Collection Time: 01/11/20  7:39 AM  Result Value Ref Range   B Natriuretic Peptide 316.1 (H) 0.0 - 100.0 pg/mL    Comment: Performed at [facility], [address] [place], [place] [address]  Glucose, capillary     Status: None   Collection Time: 01/11/20 12:43 PM  Result Value Ref Range   Glucose-Capillary 99 70 - 99 mg/dL    Comment: Glucose reference range applies only to samples taken after fasting for at least 8 hours.   Comment 1 Notify RN    CT Head Wo Contrast  Result Date: 01/11/2020 CLINICAL DATA:  Delirium EXAM: CT HEAD WITHOUT CONTRAST TECHNIQUE: Contiguous axial images were obtained from the base of the skull through the vertex without intravenous contrast. COMPARISON:  10/11/2019 head CT and prior. FINDINGS: Brain: No acute infarct or intracranial hemorrhage. Sequela of remote left occipital and left cerebellar insults. No mass lesion. No midline shift, ventriculomegaly or extra-axial fluid collection. Mild global cerebral atrophy with ex vacuo dilatation. Scattered and confluent hypodense foci involving the periventricular and subcortical white matter are nonspecific however commonly associated  with chronic microvascular ischemic changes. Vascular: No hyperdense vessel. Bilateral skull base  atherosclerotic calcifications. Skull: Negative for fracture or focal lesion. Sinuses/Orbits: Normal orbits. Clear paranasal sinuses. No mastoid effusion. Other: None. IMPRESSION: No acute intracranial process. Sequela of remote left occipital and left cerebellar insults. Mild cerebral atrophy and chronic microvascular ischemic changes. Electronically Signed   By: Primitivo Gauze M.D.   On: 01/11/2020 08:02   DG Chest Portable 1 View  Result Date: 01/11/2020 CLINICAL DATA:  Altered mental status EXAM: PORTABLE CHEST 1 VIEW COMPARISON:  11/15/2019 FINDINGS: Left-sided implanted cardiac device. Post CABG changes. Heart size is normal. Atherosclerotic calcification of the aortic knob. Mild streaky bibasilar opacities. No pleural effusion or pneumothorax. Degenerative changes of the shoulders. IMPRESSION: Mild streaky bibasilar opacities, favor atelectasis. Electronically Signed   By: Davina Poke D.O.   On: 01/11/2020 07:57   DG Toe Great Left  Result Date: 01/11/2020 CLINICAL DATA:  Left great toe pain and swelling EXAM: LEFT GREAT TOE COMPARISON:  10/07/2019 FINDINGS: There is overlying soft tissue wound along the plantar and medial aspects of the left great toe distal phalanx. There is cortical destruction of the medial cortex of the great toe distal phalanx, new from prior, compatible with acute osteomyelitis. No fracture. No dislocation. Similar degenerative changes. Advanced small vessel atherosclerotic calcification. No tracking soft tissue gas. IMPRESSION: 1. Cortical destruction of the medial cortex of the left great toe distal phalanx, new from prior, compatible with acute osteomyelitis. Overlying soft tissue wound noted. No tracking soft tissue gas. 2. Advanced small vessel atherosclerotic calcification. Electronically Signed   By: Davina Poke D.O.   On: 01/11/2020 07:59    Blood pressure (!) 158/77, pulse 93, temperature 97.9 F (36.6 C), resp. rate 17, SpO2 97  %.   Assessment 1. Altered mental status secondary to overdose on prescription drugs 2. Chronic osteomyelitis left hallux 3. Diabetes type 2 polyneuropathy 4. Peripheral vascular disease  Plan -Patient seen and examined. -Patient appears to be altered mentally today and unable to sustain conversation and patient speech is slurred. -X-rays reviewed.  Appears to have osteomyelitic changes to the distal phalanx of the left hallux which is unchanged from previous imaging that was most recently performed. -Applied Betadine wet-to-dry dressing to left big toe.  Order placed for daily dressings.  Appears to be very stable at this time with no acute signs of infection but bone is still exposed. -Appreciate medicine recommendations for antibiotic therapy.  Believe that active acute infection is not present.  White count appears to be within normal limits.  Not sure that antibiotics are indicated at this time for left big toe chronic osteomyelitis. -Patient would still benefit from surgical procedure would like to perform this still on Friday.  Patient is scheduled to have this at [facility] at 7:30am in the morning.  Patient does need clearance from cardiology and internal medicine standpoint.  Defer to medicine for further evaluation.  The patient is deemed to be safe to undergo procedure and that we could perform this during his inpatient stay.  Not urgent at this time. -Patient recently underwent revascularization procedure with Dr. Lucky Cowboy back in May.  If having further wound healing issues may reconsult or send for another visit to establish care. -Unable to have discussion again with patient about surgery today due to patient's mental status.  We will need to recheck again tomorrow and if not altered then will try to obtain consent taken for procedure on Friday morning 8/13.  Patient had to be n.p.o. at midnight prior to procedure.  Caroline More, DPM 01/11/2020, 12:52  PM

## 2020-01-11 NOTE — ED Notes (Signed)
Podiatry to bedside; dry dressing placed to left foot.

## 2020-01-11 NOTE — Progress Notes (Signed)
Pt is A+Ox2. Attempted to contact daughter Donah Driver to complete admission questions. No answer.

## 2020-01-11 NOTE — Progress Notes (Signed)
  [facility] Perioperative Services: Pre-Admission/Anesthesia Testing     Date: 01/11/20  Name: Jason Montes MRN:   [id number]  Re: Cardiology clearance for surgery  Cardiology clearance for surgery has been issued by [facility]. I have been in contact with the clinic today and they plan on sending over the perioperative Rx for cardiac device programming form. Surgeon's office has been made aware that clearance have been received.   Honor Loh, MSN, APRN, FNP-C, CEN [facility]  Peri-operative Services Nurse Practitioner Phone: [phone] 01/11/20 1130 AM

## 2020-01-11 NOTE — ED Provider Notes (Signed)
[facility] Emergency Department Provider Note  ____________________________________________   First MD Initiated Contact with Patient 01/11/20 [phone]     (approximate)  I have reviewed the triage vital signs and the nursing notes.   HISTORY  Chief Complaint Altered Mental Status    HPI Jason Montes is a 69 y.o. male  With extensive PMHx as below including HTN, HLD, AFib, CAD, CKD, chronic osteo L great toe, here with AMS. Pt arrives via EMS with AMS. Per report, pt was found by his daughter this AM confused, altered. He was found next to a bottle of flexeril and tramadol, with more than expected missing. Unclear when he took these. No h/o intentional OD. 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 Remainder of history limited 2/2 AMS.  Level 5 caveat invoked as remainder of history, ROS, and physical exam limited due to patient's encephalopathy.         Past Medical History:  Diagnosis Date   Allergy    Atrial fibrillation ([place])    B12 deficiency    Chronic kidney disease    Chronic kidney disease (CKD), stage II (mild)    Coronary atherosclerosis    Diabetes mellitus without complication (HCC)    Elevated PSA    Esophagitis    GERD (gastroesophageal reflux disease)    Heart attack ([place])    Heart disease    Hyperlipidemia    Hypertension    Hypokalemia    Mild neurocognitive disorder    Osteoarthritis    Presbyopia    PVD (peripheral vascular disease) ([place])    Restless leg syndrome    Stroke (cerebrum) ([place])    Stroke ([place])    Subacute osteomyelitis ([place])    Tinea unguium    Uncompensated short term memory deficit     Patient Active Problem List   Diagnosis Date Noted   Intertrochanteric fracture of right femur, closed, initial encounter ([place]) 11/15/2019   Accidental fall 11/15/2019   Preoperative clearance 11/15/2019   Closed  right hip fracture, initial encounter ([place]) 11/15/2019   Diabetic foot infection ([place])    Stage 3a chronic kidney disease    Cellulitis of left foot 10/07/2019   Malnutrition of moderate degree 03/13/2019   ARF (acute renal failure) ([place]) 03/11/2019   Family history of malignant neoplasm of gastrointestinal tract    Benign neoplasm of transverse colon    Benign neoplasm of cecum    Benign neoplasm of ascending colon    Benign neoplasm of descending colon    Polyp of sigmoid colon    Atrial fibrillation ([place]) 02/25/2018   Vitamin B12 deficiency anemia 02/25/2018   CKD (chronic kidney disease) stage 2, GFR 60-89 ml/min 02/25/2018   CAD (coronary artery disease) 02/25/2018   DM (diabetes mellitus), type 2, uncontrolled with complications ([place]) 97/67/3419   GERD with esophagitis 02/25/2018   Generalized muscle weakness 02/25/2018   Chronic diastolic heart failure ([place]) 02/25/2018   Chronic anticoagulation 02/25/2018   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[place]) 02/25/2018   Hyperlipidemia 02/25/2018   Essential hypertension 02/25/2018   Hypokalemia 02/25/2018   Mild neurocognitive disorder 02/25/2018   Osteoarthritis 02/25/2018   PVD (peripheral vascular disease) ([place]) 02/25/2018   Presbyopia 02/25/2018   Restless leg syndrome 02/25/2018   History of stroke with current residual effects 02/25/2018   Subacute osteomyelitis of right foot ([place]) 02/25/2018   Tinea unguium 02/25/2018    Past Surgical History:  Procedure Laterality Date   CARDIAC PACEMAKER PLACEMENT     COLONOSCOPY WITH PROPOFOL N/A 05/18/2018   Procedure: COLONOSCOPY WITH PROPOFOL;  Surgeon: [name], MD;  Location[facility] ENDOSCOPY;  Service: Endoscopy;  Laterality: N/A;   CORONARY ARTERY BYPASS GRAFT     FOOT AMPUTATION Right    INTRAMEDULLARY (IM) NAIL INTERTROCHANTERIC Right 11/16/2019   Procedure: INTRAMEDULLARY (IM) NAIL INTERTROCHANTRIC;  Surgeon: Thornton Park, MD;  Location: [facility]  ORS;  Service: Orthopedics;  Laterality: Right;   LOWER EXTREMITY ANGIOGRAPHY Left 10/10/2019   Procedure: Lower Extremity Angiography;  Surgeon: Algernon Huxley, MD;  Location: [facility] LAB;  Service: Cardiovascular;  Laterality: Left;    Prior to Admission medications   Medication Sig Start Date End Date Taking? Authorizing Provider  apixaban (ELIQUIS) 5 MG TABS tablet Take 5 mg by mouth 2 (two) times daily. Patient not taking: Reported on 12/23/2019    [provider]  aspirin EC 81 MG tablet Take 81 mg by mouth daily.    [provider]  atorvastatin (LIPITOR) 40 MG tablet Take 40 mg by mouth at bedtime.     [provider]  betamethasone valerate (VALISONE) 0.1 % cream Apply 1 application topically 2 (two) times daily as needed. Patient not taking: Reported on 12/23/2019    [provider]  carvedilol (COREG) 6.25 MG tablet Take 3.125 mg by mouth 2 (two) times daily with a meal.     [provider]  cetirizine (ZYRTEC) 10 MG tablet Take 10 mg by mouth daily.    [provider]  Cholecalciferol (VITAMIN D3) 1000 units CAPS Take 1,000 Units by mouth daily.     [provider]  cyanocobalamin (,VITAMIN B-12,) 1000 MCG/ML injection Inject 1,000 mcg into the muscle every 30 (thirty) days. Patient not taking: Reported on 12/23/2019    [provider]  cyclobenzaprine (FLEXERIL) 10 MG tablet Take 10 mg by mouth 2 (two) times daily as needed for muscle spasms. Patient not taking: Reported on 12/23/2019    [provider]  docusate sodium (COLACE) 50 MG capsule Take 100 mg by mouth daily.  Patient not taking: Reported on 12/23/2019    [provider]  empagliflozin (JARDIANCE) 25 MG TABS tablet Take 12.5 mg by mouth daily.    [provider]  Ensure Max Protein (ENSURE MAX PROTEIN) LIQD Take 330 mLs (11 oz total) by mouth daily. Patient not taking: Reported on 12/23/2019 11/25/19   Cristal Ford,  DO  famotidine (PEPCID) 20 MG tablet Take 20 mg by mouth daily.     [provider]  feeding supplement, GLUCERNA SHAKE, (GLUCERNA SHAKE) LIQD Take 237 mLs by mouth 2 (two) times daily between meals. Patient not taking: Reported on 12/23/2019 11/24/19   Cristal Ford, DO  fluticasone [facility]) 50 MCG/ACT nasal spray Place 2 sprays into both nostrils daily.     [provider]  gabapentin (NEURONTIN) 300 MG capsule Take 1 capsule (300 mg total) by mouth 2 (two) times daily. Patient taking differently: Take 300-600 mg by mouth See admin instructions. Take 300 mg by mouth in the morning and 600 mg at bedtime 11/24/19   Cristal Ford, DO  glucose 4 GM chewable tablet Chew 4 tablets by mouth as needed for low blood sugar.    [provider]  HYDROcodone-acetaminophen (NORCO/VICODIN) 5-325 MG tablet Take 1-2 tablets by mouth every 6 (six) hours as needed for severe pain. Patient not taking: Reported on 12/23/2019 11/24/19   Cristal Ford, DO  lisinopril (ZESTRIL) 2.5 MG tablet Take 2.5 mg by mouth daily.    [provider]  metFORMIN (GLUCOPHAGE) 500 MG tablet Start 1 tab by mouth in am daily for 7 days. Then increase to 1 tab in am & pm for 7 days.  Continue weekly increase to 2 tabs twice daily. Patient taking differently: Take 500 mg by mouth 2 (two) times daily.  02/24/18   Mikey College, NP  mirtazapine (REMERON) 15 MG tablet Take 15 mg by mouth at bedtime.    [provider]  nitroGLYCERIN (NITROSTAT) 0.4 MG SL tablet Place 0.4 mg under the tongue every 5 (five) minutes as needed for chest pain.    [provider]  Semaglutide, 1 MG/DOSE, 2 MG/1.5ML SOPN Inject 1 mg into the skin once a week.     [provider]  senna (SENOKOT) 8.6 MG TABS tablet Take 1 tablet (8.6 mg total) by mouth 2 (two) times daily. Patient taking differently: Take 2 tablets by mouth in the morning and at bedtime.  11/24/19   Mikhail, Velta Addison, DO    senna-docusate (SENOKOT-S) 8.6-50 MG tablet Take 1 tablet by mouth at bedtime as needed for mild constipation. Patient not taking: Reported on 12/23/2019 11/24/19   Cristal Ford, DO  tamsulosin (FLOMAX) 0.4 MG CAPS capsule Take 0.4 mg by mouth daily.     [provider]  torsemide (DEMADEX) 20 MG tablet Take 20 mg by mouth daily.     [provider]  traMADol (ULTRAM) 50 MG tablet Take 1 tablet (50 mg total) by mouth every 6 (six) hours. Patient not taking: Reported on 12/23/2019 11/24/19   Cristal Ford, DO    Allergies Norvasc [amlodipine besylate] and Penicillins  Family History  Problem Relation Age of Onset   Heart failure Mother    Colon cancer Mother    Diabetes Mother    Thyroid disease Sister     Social History Social History   Tobacco Use   Smoking status: Never Smoker   Smokeless tobacco: Never Used  Scientific laboratory technician Use: Never used  Substance Use Topics   Alcohol use: Never   Drug use: Never    Review of Systems  Review of Systems  Unable to perform ROS: Mental status change     ____________________________________________  PHYSICAL EXAM:      VITAL SIGNS: ED Triage Vitals  Enc Vitals Group     BP 01/11/20 0656 139/80     Pulse Rate 01/11/20 0656 98     Resp 01/11/20 0656 16     Temp 01/11/20 0654 97.9 F (36.6 C)     Temp src --      SpO2 01/11/20 0656 99 %     Weight --      Height --      Head Circumference --      Peak Flow --      Pain Score 01/11/20 0652 0     Pain Loc --      Pain Edu? --      Excl. in [place]? --      Physical Exam Vitals and nursing note reviewed.  Constitutional:      General: He is not in acute distress.    Appearance: He is well-developed.     Comments: Chronically ill appearing  HENT:     Head: Normocephalic and atraumatic.  Eyes:     Conjunctiva/sclera: Conjunctivae normal.  Cardiovascular:     Rate and Rhythm: Normal rate  and regular rhythm.     Heart sounds: Normal  heart sounds. No murmur heard.  No friction rub.  Pulmonary:     Effort: Pulmonary effort is normal. No respiratory distress.     Breath sounds: Normal breath sounds. No wheezing or rales.  Abdominal:     General: There is no distension.     Palpations: Abdomen is soft.     Tenderness: There is no abdominal tenderness.  Musculoskeletal:     Cervical back: Neck supple.  Skin:    General: Skin is warm.     Capillary Refill: Capillary refill takes less than 2 seconds.  Neurological:     Mental Status: He is alert. He is disoriented.     Motor: No abnormal muscle tone.     Comments: Speech slurred, falls asleep during exam. Confused but follows commands. PERRL, 4 mm bilaterally. No apparent focal deficits.       ____________________________________________   LABS (all labs ordered are listed, but only abnormal results are displayed)  Labs Reviewed  COMPREHENSIVE METABOLIC PANEL - Abnormal; Notable for the following components:      Result Value   Sodium 134 (*)    CO2 21 (*)    Glucose, Bld 115 (*)    BUN 34 (*)    Creatinine, Ser 1.59 (*)    Total Protein 9.4 (*)    Total Bilirubin 1.4 (*)    GFR calc non Af Amer 44 (*)    GFR calc Af Amer 51 (*)    All other components within normal limits  CBC - Abnormal; Notable for the following components:   RBC 4.11 (*)    Hemoglobin 12.0 (*)    HCT 36.2 (*)    All other components within normal limits  SEDIMENTATION RATE - Abnormal; Notable for the following components:   Sed Rate 83 (*)    All other components within normal limits  SARS CORONAVIRUS 2 BY RT PCR (HOSPITAL ORDER, [facility])  BLOOD GAS, VENOUS  LACTIC ACID, PLASMA  LACTIC ACID, PLASMA  C-REACTIVE PROTEIN  URINALYSIS, COMPLETE (UACMP) WITH MICROSCOPIC  CBG MONITORING, ED    ____________________________________________  EKG: Normal sinus rhythm, VR 97. PR 164, QRS 94, QTc 445. No acute St elevations. TWI in inferolateral leads, no  overt ST elevations or depressions. ________________________________________  RADIOLOGY All imaging, including plain films, CT scans, and ultrasounds, independently reviewed by me, and interpretations confirmed via formal radiology reads.  ED MD interpretation:   CT Head: NAICA, old infarct noted CXR: Bibasilar atelectasis XR Toe: Likely acute on chronic osteo   Official radiology report(s): CT Head Wo Contrast  Result Date: 01/11/2020 CLINICAL DATA:  [place] EXAM: CT HEAD WITHOUT CONTRAST TECHNIQUE: Contiguous axial images were obtained from the base of the skull through the vertex without intravenous contrast. COMPARISON:  10/11/2019 head CT and prior. FINDINGS: Brain: No acute infarct or intracranial hemorrhage. Sequela of remote left occipital and left cerebellar insults. No mass lesion. No midline shift, ventriculomegaly or extra-axial fluid collection. Mild global cerebral atrophy with ex vacuo dilatation. Scattered and confluent hypodense foci involving the periventricular and subcortical white matter are nonspecific however commonly associated with chronic microvascular ischemic changes. Vascular: No hyperdense vessel. Bilateral skull base atherosclerotic calcifications. Skull: Negative for fracture or focal lesion. Sinuses/Orbits: Normal orbits. Clear paranasal sinuses. No mastoid effusion. Other: None. IMPRESSION: No acute intracranial process. Sequela of remote left occipital and left cerebellar insults. Mild cerebral atrophy and chronic microvascular ischemic changes.  Electronically Signed   By: Primitivo Gauze M.D.   On: 01/11/2020 08:02   DG Chest Portable 1 View  Result Date: 01/11/2020 CLINICAL DATA:  Altered mental status EXAM: PORTABLE CHEST 1 VIEW COMPARISON:  11/15/2019 FINDINGS: Left-sided implanted cardiac device. Post CABG changes. Heart size is normal. Atherosclerotic calcification of the aortic knob. Mild streaky bibasilar opacities. No pleural effusion or  pneumothorax. Degenerative changes of the shoulders. IMPRESSION: Mild streaky bibasilar opacities, favor atelectasis. Electronically Signed   By: Davina Poke D.O.   On: 01/11/2020 07:57   DG Toe Great Left  Result Date: 01/11/2020 CLINICAL DATA:  Left great toe pain and swelling EXAM: LEFT GREAT TOE COMPARISON:  10/07/2019 FINDINGS: There is overlying soft tissue wound along the plantar and medial aspects of the left great toe distal phalanx. There is cortical destruction of the medial cortex of the great toe distal phalanx, new from prior, compatible with acute osteomyelitis. No fracture. No dislocation. Similar degenerative changes. Advanced small vessel atherosclerotic calcification. No tracking soft tissue gas. IMPRESSION: 1. Cortical destruction of the medial cortex of the left great toe distal phalanx, new from prior, compatible with acute osteomyelitis. Overlying soft tissue wound noted. No tracking soft tissue gas. 2. Advanced small vessel atherosclerotic calcification. Electronically Signed   By: Davina Poke D.O.   On: 01/11/2020 07:59    ____________________________________________  PROCEDURES   Procedure(s) performed (including Critical Care):  .1-3 Lead EKG Interpretation Performed by: Duffy Bruce, MD Authorized by: Duffy Bruce, MD     Interpretation: abnormal     ECG rate:  90-100   ECG rate assessment: normal     Rhythm: atrial fibrillation     Ectopy: none     Conduction: normal   Comments:     Indication: AMS    ____________________________________________  INITIAL IMPRESSION / MDM / ASSESSMENT AND PLAN / ED COURSE  As part of my medical decision making, I reviewed the following data within the [facility] notes reviewed and incorporated, Old chart reviewed, Notes from prior ED visits, and [place] Controlled Substance Database       *RENNIE ROUCH was evaluated in Emergency Department on 01/11/2020 for the symptoms  described in the history of present illness. He was evaluated in the context of the global COVID-19 pandemic, which necessitated consideration that the patient might be at risk for infection with the SARS-CoV-2 virus that causes COVID-19. Institutional protocols and algorithms that pertain to the evaluation of patients at risk for COVID-19 are in a state of rapid change based on information released by regulatory bodies including the CDC and federal and state organizations. These policies and algorithms were followed during the patient's care in the ED.  Some ED evaluations and interventions may be delayed as a result of limited staffing during the pandemic.*     Medical Decision Making:  69 yo M with extensive PMHx as above here w/ AMS. Likely 2/2 polypharmacy with tramadol and flexeril use, but DDx also includes CVA (h/o AFib, CVA), hypercapnia, metabolic or infectious encephalopathy. Afebrile without signs of sepsis on arrival. No apparent focal deficits. Will check broad labs, imaging, and reassess.  Labs, imaging reviewed. CBC without significant leukocytosis. CMP with baseline CKD. VBG without CO2 retention. LA, ESR, CRP pending. Pt remains drowsy, but protecting airway. Of note, he arrives w/ DNR in place.  Labs overall reassuring. ESR elevated but near baseline. Imaging shows osteo of L great toe which is known.  Will admit for management of  polypharmacy w/ possible intentional OD. I also called Dr. Vickki Muff with Podiatry who will let Dr. Luana Shu know, and formal c/s has been placed.   ____________________________________________  FINAL CLINICAL IMPRESSION(S) / ED DIAGNOSES  Final diagnoses:  Polypharmacy  Chronic kidney disease, unspecified CKD stage  Subacute osteomyelitis of left foot ([place])     MEDICATIONS GIVEN DURING THIS VISIT:  Medications - No data to display   ED Discharge Orders    None       Note:  This document was prepared using Dragon voice recognition software  and may include unintentional dictation errors.   Duffy Bruce, MD 01/11/20 [phone]

## 2020-01-11 NOTE — H&P (Signed)
History and Physical    Jason Montes RJJ:[id number] DOB: [birth year] DOA: 01/11/2020  Referring MD/NP/PA:   PCP: [facility]   Patient coming from:  The patient is coming from home.  At baseline, pt is patrially dependent for most of ADL.        Chief Complaint: AMS and overdose  HPI: Jason Montes is a 69 y.o. male with medical history significant of hypertension, hyperlipidemia, diabetes mellitus, stroke, GERD, depression, RLS, PVD, CAD, CKD-3, atrial fibrillation on Eliquis, chronic left greater toe osteomyelitis, sCHF with EF of 40-45%, who presents with altered mental status and overdose.  Per his daughter (I called her daughter by phone), patient overdosed tramadol, Flexeril and gabapentin. It is unclear how much pt actually took.  His daughter states that this is most likely due to suicidal attempt. Pt is confused with slurring speech. He moves all extremities.  No facial droop.  Patient does not have active cough, respiratory distress, nausea, vomiting, diarrhea.  Patient does not seem to have chest pain or abdominal pain.  Not sure if patient has symptoms of UTI. Of note, pt has chronic left great toe osteomyelitis.  Patient is scheduled for procedure for partial left first ray amputation on 8/13.   ED Course: pt was found to have WBC 6.7, pending COVID-19 PCR, renal function close to baseline, lactic acid 1.2, temperature normal, blood pressure 132/72, heart rate 93, RR 17, oxygen saturation 98% on room air.  CT head is negative for acute intracranial abnormalities.  X-ray of left great toe finding is consistent with osteomyelitis.  Patient is placed on MedSurg bed for observation. Dr. Luana Shu of podiatry and Dr. Janese Banks of psychiatry are consulted. Dr. Nehemiah Massed of card are consulted for presurgical clearance.  Review of Systems: Could not be reviewed accurately due to altered mental status    Allergy:  Allergies  Allergen Reactions  . Amlodipine Other (See  Comments)  . Norvasc [Amlodipine Besylate]     Unknown  . Penicillins     Childhood allergy, not sure what happens     Past Medical History:  Diagnosis Date  . Allergy   . Atrial fibrillation ([place])   . B12 deficiency   . Chronic kidney disease   . Chronic kidney disease (CKD), stage II (mild)   . Coronary atherosclerosis   . Diabetes mellitus without complication ([place])   . Elevated PSA   . Esophagitis   . GERD (gastroesophageal reflux disease)   . Heart attack ([place])   . Heart disease   . Hyperlipidemia   . Hypertension   . Hypokalemia   . Mild neurocognitive disorder   . Osteoarthritis   . Presbyopia   . PVD (peripheral vascular disease) ([place])   . Restless leg syndrome   . Stroke (cerebrum) ([place])   . Stroke ([place])   . Subacute osteomyelitis ([place])   . Tinea unguium   . Uncompensated short term memory deficit     Past Surgical History:  Procedure Laterality Date  . CARDIAC PACEMAKER PLACEMENT    . COLONOSCOPY WITH PROPOFOL N/A 05/18/2018   Procedure: COLONOSCOPY WITH PROPOFOL;  Surgeon: [name], MD;  Location: [facility] ENDOSCOPY;  Service: Endoscopy;  Laterality: N/A;  . CORONARY ARTERY BYPASS GRAFT    . FOOT AMPUTATION Right   . INTRAMEDULLARY (IM) NAIL INTERTROCHANTERIC Right 11/16/2019   Procedure: INTRAMEDULLARY (IM) NAIL INTERTROCHANTRIC;  Surgeon: Thornton Park, MD;  Location: [facility] ORS;  Service: Orthopedics;  Laterality: Right;  . LOWER EXTREMITY ANGIOGRAPHY Left 10/10/2019  Procedure: Lower Extremity Angiography;  Surgeon: Algernon Huxley, MD;  Location: [place] CV LAB;  Service: Cardiovascular;  Laterality: Left;    Social History:  reports that he has never smoked. He has never used smokeless tobacco. He reports that he does not drink alcohol and does not use drugs.  Family History:  Family History  Problem Relation Age of Onset  . Heart failure Mother   . Colon cancer Mother   . Diabetes Mother   . Thyroid disease Sister      Prior to  Admission medications   Medication Sig Start Date End Date Taking? Authorizing Provider  apixaban (ELIQUIS) 5 MG TABS tablet Take 5 mg by mouth 2 (two) times daily. Patient not taking: Reported on 12/23/2019    [provider]  aspirin EC 81 MG tablet Take 81 mg by mouth daily.    [provider]  atorvastatin (LIPITOR) 40 MG tablet Take 40 mg by mouth at bedtime.     [provider]  betamethasone valerate (VALISONE) 0.1 % cream Apply 1 application topically 2 (two) times daily as needed. Patient not taking: Reported on 12/23/2019    [provider]  carvedilol (COREG) 6.25 MG tablet Take 3.125 mg by mouth 2 (two) times daily with a meal.     [provider]  cetirizine (ZYRTEC) 10 MG tablet Take 10 mg by mouth daily.    [provider]  Cholecalciferol (VITAMIN D3) 1000 units CAPS Take 1,000 Units by mouth daily.     [provider]  cyanocobalamin (,VITAMIN B-12,) 1000 MCG/ML injection Inject 1,000 mcg into the muscle every 30 (thirty) days. Patient not taking: Reported on 12/23/2019    [provider]  cyclobenzaprine (FLEXERIL) 10 MG tablet Take 10 mg by mouth 2 (two) times daily as needed for muscle spasms. Patient not taking: Reported on 12/23/2019    [provider]  docusate sodium (COLACE) 50 MG capsule Take 100 mg by mouth daily.  Patient not taking: Reported on 12/23/2019    [provider]  empagliflozin (JARDIANCE) 25 MG TABS tablet Take 12.5 mg by mouth daily.    [provider]  Ensure Max Protein (ENSURE MAX PROTEIN) LIQD Take 330 mLs (11 oz total) by mouth daily. Patient not taking: Reported on 12/23/2019 11/25/19   Cristal Ford, DO  famotidine (PEPCID) 20 MG tablet Take 20 mg by mouth daily.     [provider]  feeding supplement, GLUCERNA SHAKE, (GLUCERNA SHAKE) LIQD Take 237 mLs by mouth 2 (two) times daily between meals. Patient not taking: Reported on 12/23/2019  11/24/19   Cristal Ford, DO  fluticasone [facility]) 50 MCG/ACT nasal spray Place 2 sprays into both nostrils daily.     [provider]  gabapentin (NEURONTIN) 300 MG capsule Take 1 capsule (300 mg total) by mouth 2 (two) times daily. Patient taking differently: Take 300-600 mg by mouth See admin instructions. Take 300 mg by mouth in the morning and 600 mg at bedtime 11/24/19   Cristal Ford, DO  glucose 4 GM chewable tablet Chew 4 tablets by mouth as needed for low blood sugar.    [provider]  HYDROcodone-acetaminophen (NORCO/VICODIN) 5-325 MG tablet Take 1-2 tablets by mouth every 6 (six) hours as needed for severe pain. Patient not taking: Reported on 12/23/2019 11/24/19   Cristal Ford, DO  lisinopril (ZESTRIL) 2.5 MG tablet Take 2.5 mg by mouth daily.    [provider]  metFORMIN (GLUCOPHAGE) 500 MG tablet  Start 1 tab by mouth in am daily for 7 days. Then increase to 1 tab in am & pm for 7 days.  Continue weekly increase to 2 tabs twice daily. Patient taking differently: Take 500 mg by mouth 2 (two) times daily.  02/24/18   Mikey College, NP  mirtazapine (REMERON) 15 MG tablet Take 15 mg by mouth at bedtime.    [provider]  nitroGLYCERIN (NITROSTAT) 0.4 MG SL tablet Place 0.4 mg under the tongue every 5 (five) minutes as needed for chest pain.    [provider]  Semaglutide, 1 MG/DOSE, 2 MG/1.5ML SOPN Inject 1 mg into the skin once a week.     [provider]  senna (SENOKOT) 8.6 MG TABS tablet Take 1 tablet (8.6 mg total) by mouth 2 (two) times daily. Patient taking differently: Take 2 tablets by mouth in the morning and at bedtime.  11/24/19   Mikhail, Velta Addison, DO  senna-docusate (SENOKOT-S) 8.6-50 MG tablet Take 1 tablet by mouth at bedtime as needed for mild constipation. Patient not taking: Reported on 12/23/2019 11/24/19   Cristal Ford, DO  tamsulosin (FLOMAX) 0.4 MG CAPS capsule Take 0.4 mg by mouth daily.      [provider]  torsemide (DEMADEX) 20 MG tablet Take 20 mg by mouth daily.     [provider]  traMADol (ULTRAM) 50 MG tablet Take 1 tablet (50 mg total) by mouth every 6 (six) hours. Patient not taking: Reported on 12/23/2019 11/24/19   Cristal Ford, DO    Physical Exam: Vitals:   01/11/20 0830 01/11/20 1200 01/11/20 1330 01/11/20 1351  BP: 132/72 (!) 158/77 131/76 137/75  Pulse: 93   92  Resp: 17 17  16   Temp:    (!) 97.5 F (36.4 C)  TempSrc:    Axillary  SpO2: 98% 97% 97% 99%   General: Not in acute distress HEENT:       Eyes: PERRL, EOMI, no scleral icterus.       ENT: No discharge from the ears and nose, no pharynx injection, no tonsillar enlargement.        Neck: No JVD, no bruit, no mass felt. Heme: No neck lymph node enlargement. Cardiac: S1/S2, RRR, No murmurs, No gallops or rubs. Respiratory:  No rales, wheezing, rhonchi or rubs. GI: Soft, nondistended, nontender, no organomegaly, BS present. GU: No hematuria Ext: No pitting leg edema bilaterally. 1+DP/PT pulse bilaterally.  All toes are missing in the right foot. Has ulcer in the plantar aspect of left great toe, without active draining.  No surrounding erythema. Musculoskeletal: No joint deformities, No joint redness or warmth, no limitation of ROM in spin. Skin: No rashes.  Neuro: Confused, knows his own name, not oriented X3 to place and time, cranial nerves II-XII grossly intact, moves all extremities Psych: Patient has hemocidal attempt  Labs on Admission: I have personally reviewed following labs and imaging studies  CBC: Recent Labs  Lab 01/11/20 0656  WBC 6.7  HGB 12.0*  HCT 36.2*  MCV 88.1  PLT PLATELET CLUMPS NOTED ON SMEAR, UNABLE TO ESTIMATE   Basic Metabolic Panel: Recent Labs  Lab 01/11/20 0656  NA 134*  K 5.1  CL 101  CO2 21*  GLUCOSE 115*  BUN 34*  CREATININE 1.59*  CALCIUM 9.6   GFR: Estimated Creatinine Clearance: 45.1 mL/min (A) (by C-G formula based on  SCr of 1.59 mg/dL (H)). Liver Function Tests: Recent Labs  Lab 01/11/20 0656  AST 29  ALT  9  ALKPHOS 82  BILITOT 1.4*  PROT 9.4*  ALBUMIN 4.0   No results for input(s): LIPASE, AMYLASE in the last 168 hours. No results for input(s): AMMONIA in the last 168 hours. Coagulation Profile: Recent Labs  Lab 01/11/20 1235  INR 1.3*   Cardiac Enzymes: No results for input(s): CKTOTAL, CKMB, CKMBINDEX, TROPONINI in the last 168 hours. BNP (last 3 results) No results for input(s): PROBNP in the last 8760 hours. HbA1C: No results for input(s): HGBA1C in the last 72 hours. CBG: Recent Labs  Lab 01/11/20 1243 01/11/20 1645  GLUCAP 99 89   Lipid Profile: No results for input(s): CHOL, HDL, LDLCALC, TRIG, CHOLHDL, LDLDIRECT in the last 72 hours. Thyroid Function Tests: No results for input(s): TSH, T4TOTAL, FREET4, T3FREE, THYROIDAB in the last 72 hours. Anemia Panel: No results for input(s): VITAMINB12, FOLATE, FERRITIN, TIBC, IRON, RETICCTPCT in the last 72 hours. Urine analysis:    Component Value Date/Time   COLORURINE STRAW (A) 11/15/2019 0549   APPEARANCEUR CLEAR (A) 11/15/2019 0549   LABSPEC 1.015 11/15/2019 0549   PHURINE 5.0 11/15/2019 0549   GLUCOSEU >=500 (A) 11/15/2019 0549   HGBUR NEGATIVE 11/15/2019 0549   BILIRUBINUR NEGATIVE 11/15/2019 0549   KETONESUR 5 (A) 11/15/2019 0549   PROTEINUR NEGATIVE 11/15/2019 0549   NITRITE NEGATIVE 11/15/2019 0549   LEUKOCYTESUR NEGATIVE 11/15/2019 0549   Sepsis Labs: @LABRCNTIP (procalcitonin:4,lacticidven:4) ) Recent Results (from the past 240 hour(s))  SARS Coronavirus 2 by RT PCR (hospital order, performed in [place] hospital lab) Nasopharyngeal Nasopharyngeal Swab     Status: None   Collection Time: 01/11/20  6:55 AM   Specimen: Nasopharyngeal Swab  Result Value Ref Range Status   SARS Coronavirus 2 NEGATIVE NEGATIVE Final    Comment: (NOTE) SARS-CoV-2 target nucleic acids are NOT DETECTED.  The SARS-CoV-2 RNA is  generally detectable in upper and lower respiratory specimens during the acute phase of infection. The lowest concentration of SARS-CoV-2 viral copies this assay can detect is 250 copies / mL. A negative result does not preclude SARS-CoV-2 infection and should not be used as the sole basis for treatment or other patient management decisions.  A negative result may occur with improper specimen collection / handling, submission of specimen other than nasopharyngeal swab, presence of viral mutation(s) within the areas targeted by this assay, and inadequate number of viral copies (<250 copies / mL). A negative result must be combined with clinical observations, patient history, and epidemiological information.  Fact Sheet for Patients:   StrictlyIdeas.no  Fact Sheet for Healthcare Providers: BankingDealers.co.za  This test is not yet approved or  cleared by the [place] FDA and has been authorized for detection and/or diagnosis of SARS-CoV-2 by FDA under an Emergency Use Authorization (EUA).  This EUA will remain in effect (meaning this test can be used) for the duration of the COVID-19 declaration under Section 564(b)(1) of the Act, 21 U.S.C. section 360bbb-3(b)(1), unless the authorization is terminated or revoked sooner.  Performed at [facility], [address] [place], [place] [address]      Radiological Exams on Admission: CT Head Wo Contrast  Result Date: 01/11/2020 CLINICAL DATA:  Delirium EXAM: CT HEAD WITHOUT CONTRAST TECHNIQUE: Contiguous axial images were obtained from the base of the skull through the vertex without intravenous contrast. COMPARISON:  10/11/2019 head CT and prior. FINDINGS: Brain: No acute infarct or intracranial hemorrhage. Sequela of remote left occipital and left cerebellar insults. No mass lesion. No midline shift, ventriculomegaly or extra-axial fluid  collection. Mild global cerebral atrophy  with ex vacuo dilatation. Scattered and confluent hypodense foci involving the periventricular and subcortical white matter are nonspecific however commonly associated with chronic microvascular ischemic changes. Vascular: No hyperdense vessel. Bilateral skull base atherosclerotic calcifications. Skull: Negative for fracture or focal lesion. Sinuses/Orbits: Normal orbits. Clear paranasal sinuses. No mastoid effusion. Other: None. IMPRESSION: No acute intracranial process. Sequela of remote left occipital and left cerebellar insults. Mild cerebral atrophy and chronic microvascular ischemic changes. Electronically Signed   By: Primitivo Gauze M.D.   On: 01/11/2020 08:02   DG Chest Portable 1 View  Result Date: 01/11/2020 CLINICAL DATA:  Altered mental status EXAM: PORTABLE CHEST 1 VIEW COMPARISON:  11/15/2019 FINDINGS: Left-sided implanted cardiac device. Post CABG changes. Heart size is normal. Atherosclerotic calcification of the aortic knob. Mild streaky bibasilar opacities. No pleural effusion or pneumothorax. Degenerative changes of the shoulders. IMPRESSION: Mild streaky bibasilar opacities, favor atelectasis. Electronically Signed   By: Davina Poke D.O.   On: 01/11/2020 07:57   DG Toe Great Left  Result Date: 01/11/2020 CLINICAL DATA:  Left great toe pain and swelling EXAM: LEFT GREAT TOE COMPARISON:  10/07/2019 FINDINGS: There is overlying soft tissue wound along the plantar and medial aspects of the left great toe distal phalanx. There is cortical destruction of the medial cortex of the great toe distal phalanx, new from prior, compatible with acute osteomyelitis. No fracture. No dislocation. Similar degenerative changes. Advanced small vessel atherosclerotic calcification. No tracking soft tissue gas. IMPRESSION: 1. Cortical destruction of the medial cortex of the left great toe distal phalanx, new from prior, compatible with acute osteomyelitis. Overlying soft tissue wound noted. No  tracking soft tissue gas. 2. Advanced small vessel atherosclerotic calcification. Electronically Signed   By: Davina Poke D.O.   On: 01/11/2020 07:59     EKG: Independently reviewed.  Sinus rhythm, QTC 445, T wave inversion in V6, nonspecific T wave change.  Assessment/Plan Principal Problem:   Acute metabolic encephalopathy Active Problems:   Atrial fibrillation (HCC)   CAD (coronary artery disease)   GERD with esophagitis   Chronic systolic CHF (congestive heart failure) (HCC)   Hyperlipidemia   Essential hypertension   Stage 3a chronic kidney disease   Osteomyelitis of great toe of left foot (HCC)   Type II diabetes mellitus with renal manifestations (HCC)   Stroke (HCC)   Depression   Suicidal behavior   Acute metabolic encephalopathy: CT head is negative for acute intracranial abnormalities, most likely due to overdose.  Per his daughter, patient overdosed of tramadol, Flexeril and gabapentin.  Patient also has several other sedative medication on his list, including Norco, Remeron, Zyrtec.  -will place on med-surg bed for obs -Frequent neuro checks -Hold all sedative medications including  tramadol, Flexeril, gabapentin, Norco, Remeron, Zyrtec.  Suicidal behavior and a history of depression -consulted Dr. Janese Banks of psychiatry -Suicidal precaution -Hold Remeron due to overdose  Osteomyelitis of great toe of left foot: Patient does not have fever or leukocytosis.  Lactic acid is normal.  Clinically not septic.  Will hold off antibiotics. - blood culture -consulted Dr. Luana Shu of podiatry for possible amputation -Consulted cardiology, Dr. Roland Earl for presurgical clearance  Atrial fibrillation ([place]) -hold Eliquis -coreg  CAD (coronary artery disease): -ASA, lipitor, -prn NTG  GERD with esophagitis -Pepcide  Chronic systolic CHF (congestive heart failure) ([place]): 2D echo on 11/15/2019 showed EF of 40-45% patient does not have leg edema or JVD.  CHF symptoms  compensated. -Continue home torsemide -Check  BNP -->316  Hyperlipidemia -lipitor  Essential hypertension -IV hydralazine as needed -Continue Coreg and lisinopril  Stage 3a chronic kidney disease: Stable -Follow-up of BMP  Type II diabetes mellitus with renal manifestations [facility]): Recent A1c 11.4, poorly controlled.  Patient is taking semaglutide, Jardiance, Metformin -Sliding scale insulin  Stroke ([place]) -Continue aspirin, Lipitor          DVT ppx: SCD Code Status: DNR per his daughter Family Communication: Yes, patient's daughter by phone Disposition Plan:  Anticipate discharge back to previous environment Consults called:  Dr. Luana Shu of podiatry, Dr. Janese Banks of psychiatry, Dr. Nehemiah Massed of card Admission status: Med-surg bed for obs     Status is: Observation  The patient remains OBS appropriate and will d/c before 2 midnights.  Dispo: The patient is from: Home              Anticipated d/c is to: Home              Anticipated d/c date is: 1 day              Patient currently is not medically stable to d/c.           Date of Service 01/11/2020    Ivor Costa [facility]   If 7PM-7AM, please contact night-coverage [email or web address] 01/11/2020, 4:50 PM

## 2020-01-11 NOTE — ED Triage Notes (Signed)
Pt arrives via [facility] from home. Lives alone. Daughter called EMS for AMS. Pt recent d/c from hospital. At home he had a bottle of tramadol 50 mg and cyclobenzabrine 2mg  with approx 5-10 tablets missing out of each. Has been drowsy en route and oriented. En route, 194/84, 98% RA, 103 CBG. Pt also has gangrene of the toe on the left foot

## 2020-01-12 DIAGNOSIS — E1152 Type 2 diabetes mellitus with diabetic peripheral angiopathy with gangrene: Secondary | ICD-10-CM | POA: Diagnosis present

## 2020-01-12 DIAGNOSIS — M869 Osteomyelitis, unspecified: Secondary | ICD-10-CM | POA: Diagnosis not present

## 2020-01-12 DIAGNOSIS — I429 Cardiomyopathy, unspecified: Secondary | ICD-10-CM | POA: Diagnosis present

## 2020-01-12 DIAGNOSIS — M86272 Subacute osteomyelitis, left ankle and foot: Secondary | ICD-10-CM | POA: Diagnosis present

## 2020-01-12 DIAGNOSIS — E1142 Type 2 diabetes mellitus with diabetic polyneuropathy: Secondary | ICD-10-CM | POA: Diagnosis not present

## 2020-01-12 DIAGNOSIS — E785 Hyperlipidemia, unspecified: Secondary | ICD-10-CM | POA: Diagnosis present

## 2020-01-12 DIAGNOSIS — R338 Other retention of urine: Secondary | ICD-10-CM | POA: Diagnosis not present

## 2020-01-12 DIAGNOSIS — J690 Pneumonitis due to inhalation of food and vomit: Secondary | ICD-10-CM | POA: Diagnosis not present

## 2020-01-12 DIAGNOSIS — T40422A Poisoning by tramadol, intentional self-harm, initial encounter: Secondary | ICD-10-CM | POA: Diagnosis present

## 2020-01-12 DIAGNOSIS — I70262 Atherosclerosis of native arteries of extremities with gangrene, left leg: Secondary | ICD-10-CM | POA: Diagnosis present

## 2020-01-12 DIAGNOSIS — G8918 Other acute postprocedural pain: Secondary | ICD-10-CM | POA: Diagnosis not present

## 2020-01-12 DIAGNOSIS — N179 Acute kidney failure, unspecified: Secondary | ICD-10-CM | POA: Diagnosis not present

## 2020-01-12 DIAGNOSIS — E870 Hyperosmolality and hypernatremia: Secondary | ICD-10-CM | POA: Diagnosis not present

## 2020-01-12 DIAGNOSIS — G9341 Metabolic encephalopathy: Secondary | ICD-10-CM | POA: Diagnosis not present

## 2020-01-12 DIAGNOSIS — E872 Acidosis: Secondary | ICD-10-CM | POA: Diagnosis not present

## 2020-01-12 DIAGNOSIS — R7309 Other abnormal glucose: Secondary | ICD-10-CM | POA: Diagnosis not present

## 2020-01-12 DIAGNOSIS — E871 Hypo-osmolality and hyponatremia: Secondary | ICD-10-CM | POA: Diagnosis not present

## 2020-01-12 DIAGNOSIS — Z6825 Body mass index (BMI) 25.0-25.9, adult: Secondary | ICD-10-CM | POA: Diagnosis not present

## 2020-01-12 DIAGNOSIS — I472 Ventricular tachycardia: Secondary | ICD-10-CM | POA: Diagnosis not present

## 2020-01-12 DIAGNOSIS — E43 Unspecified severe protein-calorie malnutrition: Secondary | ICD-10-CM | POA: Diagnosis present

## 2020-01-12 DIAGNOSIS — R45851 Suicidal ideations: Secondary | ICD-10-CM | POA: Diagnosis not present

## 2020-01-12 DIAGNOSIS — R4182 Altered mental status, unspecified: Secondary | ICD-10-CM | POA: Diagnosis not present

## 2020-01-12 DIAGNOSIS — I69351 Hemiplegia and hemiparesis following cerebral infarction affecting right dominant side: Secondary | ICD-10-CM | POA: Diagnosis not present

## 2020-01-12 DIAGNOSIS — Z66 Do not resuscitate: Secondary | ICD-10-CM | POA: Diagnosis present

## 2020-01-12 DIAGNOSIS — F332 Major depressive disorder, recurrent severe without psychotic features: Secondary | ICD-10-CM | POA: Diagnosis present

## 2020-01-12 DIAGNOSIS — Z20822 Contact with and (suspected) exposure to covid-19: Secondary | ICD-10-CM | POA: Diagnosis present

## 2020-01-12 DIAGNOSIS — I5022 Chronic systolic (congestive) heart failure: Secondary | ICD-10-CM | POA: Diagnosis not present

## 2020-01-12 DIAGNOSIS — I10 Essential (primary) hypertension: Secondary | ICD-10-CM | POA: Diagnosis not present

## 2020-01-12 DIAGNOSIS — L89312 Pressure ulcer of right buttock, stage 2: Secondary | ICD-10-CM | POA: Diagnosis not present

## 2020-01-12 DIAGNOSIS — R569 Unspecified convulsions: Secondary | ICD-10-CM | POA: Diagnosis not present

## 2020-01-12 DIAGNOSIS — I130 Hypertensive heart and chronic kidney disease with heart failure and stage 1 through stage 4 chronic kidney disease, or unspecified chronic kidney disease: Secondary | ICD-10-CM | POA: Diagnosis present

## 2020-01-12 DIAGNOSIS — N390 Urinary tract infection, site not specified: Secondary | ICD-10-CM | POA: Diagnosis not present

## 2020-01-12 DIAGNOSIS — G931 Anoxic brain damage, not elsewhere classified: Secondary | ICD-10-CM | POA: Diagnosis present

## 2020-01-12 DIAGNOSIS — I2510 Atherosclerotic heart disease of native coronary artery without angina pectoris: Secondary | ICD-10-CM | POA: Diagnosis present

## 2020-01-12 DIAGNOSIS — Y92009 Unspecified place in unspecified non-institutional (private) residence as the place of occurrence of the external cause: Secondary | ICD-10-CM | POA: Diagnosis not present

## 2020-01-12 DIAGNOSIS — N1831 Chronic kidney disease, stage 3a: Secondary | ICD-10-CM | POA: Diagnosis present

## 2020-01-12 DIAGNOSIS — L89622 Pressure ulcer of left heel, stage 2: Secondary | ICD-10-CM | POA: Diagnosis not present

## 2020-01-12 DIAGNOSIS — E512 Wernicke's encephalopathy: Secondary | ICD-10-CM | POA: Diagnosis present

## 2020-01-12 DIAGNOSIS — D62 Acute posthemorrhagic anemia: Secondary | ICD-10-CM | POA: Diagnosis not present

## 2020-01-12 DIAGNOSIS — I480 Paroxysmal atrial fibrillation: Secondary | ICD-10-CM | POA: Diagnosis not present

## 2020-01-12 DIAGNOSIS — R339 Retention of urine, unspecified: Secondary | ICD-10-CM | POA: Diagnosis not present

## 2020-01-12 DIAGNOSIS — I4891 Unspecified atrial fibrillation: Secondary | ICD-10-CM | POA: Diagnosis not present

## 2020-01-12 DIAGNOSIS — I951 Orthostatic hypotension: Secondary | ICD-10-CM | POA: Diagnosis not present

## 2020-01-12 DIAGNOSIS — N178 Other acute kidney failure: Secondary | ICD-10-CM | POA: Diagnosis not present

## 2020-01-12 DIAGNOSIS — D696 Thrombocytopenia, unspecified: Secondary | ICD-10-CM | POA: Diagnosis not present

## 2020-01-12 LAB — CBC
HCT: 37.2 % — ABNORMAL LOW (ref 39.0–52.0)
Hemoglobin: 11.7 g/dL — ABNORMAL LOW (ref 13.0–17.0)
MCH: 28.7 pg (ref 26.0–34.0)
MCHC: 31.5 g/dL (ref 30.0–36.0)
MCV: 91.4 fL (ref 80.0–100.0)
Platelets: 181 10*3/uL (ref 150–400)
RBC: 4.07 MIL/uL — ABNORMAL LOW (ref 4.22–5.81)
RDW: 14.8 % (ref 11.5–15.5)
WBC: 6.7 10*3/uL (ref 4.0–10.5)
nRBC: 0 % (ref 0.0–0.2)

## 2020-01-12 LAB — BASIC METABOLIC PANEL
Anion gap: 12 (ref 5–15)
BUN: 31 mg/dL — ABNORMAL HIGH (ref 8–23)
CO2: 23 mmol/L (ref 22–32)
Calcium: 9.7 mg/dL (ref 8.9–10.3)
Chloride: 105 mmol/L (ref 98–111)
Creatinine, Ser: 1.32 mg/dL — ABNORMAL HIGH (ref 0.61–1.24)
GFR calc Af Amer: >60 mL/min (ref >60)
GFR calc non Af Amer: 55 mL/min — ABNORMAL LOW (ref >60)
Glucose, Bld: 77 mg/dL (ref 70–99)
Potassium: 4.5 mmol/L (ref 3.5–5.1)
Sodium: 140 mmol/L (ref 135–145)

## 2020-01-12 LAB — GLUCOSE, CAPILLARY
Glucose-Capillary: 73 mg/dL (ref 70–99)
Glucose-Capillary: 82 mg/dL (ref 70–99)
Glucose-Capillary: 93 mg/dL (ref 70–99)
Glucose-Capillary: 121 mg/dL — ABNORMAL HIGH (ref 70–99)

## 2020-01-12 LAB — URINALYSIS, COMPLETE (UACMP) WITH MICROSCOPIC
Bacteria, UA: NONE SEEN
Bilirubin Urine: NEGATIVE
Glucose, UA: >=500 mg/dL — AB
Hgb urine dipstick: NEGATIVE
Ketones, ur: 5 mg/dL — AB
Leukocytes,Ua: NEGATIVE
Nitrite: NEGATIVE
Protein, ur: NEGATIVE mg/dL
Specific Gravity, Urine: 1.017 (ref 1.005–1.030)
pH: 5 (ref 5.0–8.0)

## 2020-01-12 LAB — ETHANOL: Alcohol, Ethyl (B): <10 mg/dL (ref <10)

## 2020-01-12 NOTE — Progress Notes (Addendum)
F/u great toe ulcer left foot.  Pt is scheduled for surgery tomorrow.  Attempted to d/w pt but still not alert or oriented at this time and can not obtain informed consent at this time.  Will delay further until pt is stable.  Wound is stable without signs of infection.  Will continue to follow while in house.  Addendum:  Spoke to Daughter and she has POA and has consented to the procedure at this time. She stated patient has some noted confusion at baseline.  She stated pt understood need to proceed with surgery.  Will plan for amputation of the left great toe.

## 2020-01-12 NOTE — Consult Note (Signed)
[facility] Cardiology Consultation Note  Patient ID: Jason Montes, MRN: [id number], DOB/AGE: [id number] 67 y.o. Admit date: 01/11/2020   Date of Consult: 01/12/2020 Primary Physician: [facility] Primary Cardiologist: None  Chief Complaint:  Chief Complaint  Patient presents with  . Altered Mental Status   Reason for Consult: Preoperative clearance  HPI: 69 y.o. male with known coronary artery disease mild chronic systolic dysfunction with ejection fraction of 40% peripheral vascular disease hypertension hyperlipidemia with acute mental status changes of unknown etiology possibly due to overdose for which the patient has not had improvement in clearing.  The patient currently is unable to give history but has no apparent issues of acute coronary syndrome angina or congestive heart failure.  The patient does have an EKG showing normal sinus rhythm chronic inferior and septal infarct age undetermined with a chest x-ray showing some atelectasis and no evidence of myocardial infarction from the laboratory assessment.  The patient has had a left big toe infection from peripheral vascular disease which needs further intervention.  It appears that the patient has been stable from the cardiac standpoint with no evidence of angina or congestive heart failure by previous history.  The patient has had atrial fibrillation currently in normal sinus rhythm on appropriate medication management and stable as well.  He was on anticoagulation but currently is no longer on anticoagulation based on current information the patient is at low risk for cardiovascular complication with toe surgery  Past Medical History:  Diagnosis Date  . Allergy   . Atrial fibrillation ([place])   . B12 deficiency   . Chronic kidney disease   . Chronic kidney disease (CKD), stage II (mild)   . Coronary atherosclerosis   . Diabetes mellitus without complication ([place])   . Elevated PSA   . Esophagitis   . GERD  (gastroesophageal reflux disease)   . Heart attack ([place])   . Heart disease   . Hyperlipidemia   . Hypertension   . Hypokalemia   . Mild neurocognitive disorder   . Osteoarthritis   . Presbyopia   . PVD (peripheral vascular disease) ([place])   . Restless leg syndrome   . Stroke (cerebrum) ([place])   . Stroke ([place])   . Subacute osteomyelitis ([place])   . Tinea unguium   . Uncompensated short term memory deficit       Surgical History:  Past Surgical History:  Procedure Laterality Date  . CARDIAC PACEMAKER PLACEMENT    . COLONOSCOPY WITH PROPOFOL N/A 05/18/2018   Procedure: COLONOSCOPY WITH PROPOFOL;  Surgeon: [name], MD;  Location: [facility] ENDOSCOPY;  Service: Endoscopy;  Laterality: N/A;  . CORONARY ARTERY BYPASS GRAFT    . FOOT AMPUTATION Right   . INTRAMEDULLARY (IM) NAIL INTERTROCHANTERIC Right 11/16/2019   Procedure: INTRAMEDULLARY (IM) NAIL INTERTROCHANTRIC;  Surgeon: Thornton Park, MD;  Location: [facility] ORS;  Service: Orthopedics;  Laterality: Right;  . LOWER EXTREMITY ANGIOGRAPHY Left 10/10/2019   Procedure: Lower Extremity Angiography;  Surgeon: Algernon Huxley, MD;  Location: [place] [facility] LAB;  Service: Cardiovascular;  Laterality: Left;     Home Meds: Prior to Admission medications   Medication Sig Start Date End Date Taking? Authorizing Provider  apixaban (ELIQUIS) 5 MG TABS tablet Take 5 mg by mouth 2 (two) times daily. Patient not taking: Reported on 12/23/2019    [provider]  aspirin EC 81 MG tablet Take 81 mg by mouth daily.    [provider]  atorvastatin (LIPITOR) 40 MG tablet Take  40 mg by mouth at bedtime.     [provider]  betamethasone valerate (VALISONE) 0.1 % cream Apply 1 application topically 2 (two) times daily as needed. Patient not taking: Reported on 12/23/2019    [provider]  carvedilol (COREG) 6.25 MG tablet Take 3.125 mg by mouth 2 (two) times daily with a meal.     [provider]  cetirizine  (ZYRTEC) 10 MG tablet Take 10 mg by mouth daily.    [provider]  Cholecalciferol (VITAMIN D3) 1000 units CAPS Take 1,000 Units by mouth daily.     [provider]  cyanocobalamin (,VITAMIN B-12,) 1000 MCG/ML injection Inject 1,000 mcg into the muscle every 30 (thirty) days. Patient not taking: Reported on 12/23/2019    [provider]  cyclobenzaprine (FLEXERIL) 10 MG tablet Take 10 mg by mouth 2 (two) times daily as needed for muscle spasms. Patient not taking: Reported on 12/23/2019    [provider]  docusate sodium (COLACE) 50 MG capsule Take 100 mg by mouth daily.  Patient not taking: Reported on 12/23/2019    [provider]  empagliflozin (JARDIANCE) 25 MG TABS tablet Take 12.5 mg by mouth daily.    [provider]  Ensure Max Protein (ENSURE MAX PROTEIN) LIQD Take 330 mLs (11 oz total) by mouth daily. Patient not taking: Reported on 12/23/2019 11/25/19   Cristal Ford, DO  famotidine (PEPCID) 20 MG tablet Take 20 mg by mouth daily.     [provider]  feeding supplement, GLUCERNA SHAKE, (GLUCERNA SHAKE) LIQD Take 237 mLs by mouth 2 (two) times daily between meals. Patient not taking: Reported on 12/23/2019 11/24/19   Cristal Ford, DO  fluticasone [facility]) 50 MCG/ACT nasal spray Place 2 sprays into both nostrils daily.     [provider]  gabapentin (NEURONTIN) 300 MG capsule Take 1 capsule (300 mg total) by mouth 2 (two) times daily. Patient taking differently: Take 300-600 mg by mouth See admin instructions. Take 300 mg by mouth in the morning and 600 mg at bedtime 11/24/19   Cristal Ford, DO  glucose 4 GM chewable tablet Chew 4 tablets by mouth as needed for low blood sugar.    [provider]  HYDROcodone-acetaminophen (NORCO/VICODIN) 5-325 MG tablet Take 1-2 tablets by mouth every 6 (six) hours as needed for severe pain. Patient not taking: Reported on 12/23/2019 11/24/19   Cristal Ford, DO   lisinopril (ZESTRIL) 2.5 MG tablet Take 2.5 mg by mouth daily.    [provider]  metFORMIN (GLUCOPHAGE) 500 MG tablet Start 1 tab by mouth in am daily for 7 days. Then increase to 1 tab in am & pm for 7 days.  Continue weekly increase to 2 tabs twice daily. Patient taking differently: Take 500 mg by mouth 2 (two) times daily.  02/24/18   Mikey College, NP  mirtazapine (REMERON) 15 MG tablet Take 15 mg by mouth at bedtime.    [provider]  nitroGLYCERIN (NITROSTAT) 0.4 MG SL tablet Place 0.4 mg under the tongue every 5 (five) minutes as needed for chest pain.    [provider]  Semaglutide, 1 MG/DOSE, 2 MG/1.5ML SOPN Inject 1 mg into the skin once a week.     [provider]  senna (SENOKOT) 8.6 MG TABS tablet Take 1 tablet (8.6 mg total) by mouth 2 (two) times daily. Patient taking differently: Take 2 tablets by mouth in the morning and at bedtime.  11/24/19   Mikhail,  Maryann, DO  tamsulosin (FLOMAX) 0.4 MG CAPS capsule Take 0.4 mg by mouth daily.     [provider]  torsemide (DEMADEX) 20 MG tablet Take 20 mg by mouth daily.     [provider]  traMADol (ULTRAM) 50 MG tablet Take 1 tablet (50 mg total) by mouth every 6 (six) hours. Patient not taking: Reported on 12/23/2019 11/24/19   Cristal Ford, DO    Inpatient Medications:  . aspirin EC  81 mg Oral Daily  . atorvastatin  40 mg Oral QHS  . carvedilol  3.125 mg Oral BID WC  . cholecalciferol  1,000 Units Oral Daily  . famotidine  20 mg Oral Daily  . fluticasone  2 spray Each Nare Daily  . insulin aspart  0-5 Units Subcutaneous QHS  . insulin aspart  0-9 Units Subcutaneous TID WC  . lisinopril  2.5 mg Oral Daily  . tamsulosin  0.4 mg Oral Daily  . torsemide  20 mg Oral Daily     Allergies:  Allergies  Allergen Reactions  . Amlodipine Other (See Comments)  . Norvasc [Amlodipine Besylate]     Unknown  . Penicillins     Childhood allergy, not sure what happens      Social History   Socioeconomic History  . Marital status: Single    Spouse name: Not on file  . Number of children: Not on file  . Years of education: Not on file  . Highest education level: Not on file  Occupational History  . Not on file  Tobacco Use  . Smoking status: Never Smoker  . Smokeless tobacco: Never Used  Vaping Use  . Vaping Use: Never used  Substance and Sexual Activity  . Alcohol use: Never  . Drug use: Never  . Sexual activity: Not on file  Other Topics Concern  . Not on file  Social History Narrative  . Not on file   Social Determinants of Health   Financial Resource Strain:   . Difficulty of Paying Living Expenses:   Food Insecurity:   . Worried About Charity fundraiser in the Last Year:   . Arboriculturist in the Last Year:   Transportation Needs:   . Film/video editor (Medical):   Marland Kitchen Lack of Transportation (Non-Medical):   Physical Activity:   . Days of Exercise per Week:   . Minutes of Exercise per Session:   Stress:   . Feeling of Stress :   Social Connections:   . Frequency of Communication with Friends and Family:   . Frequency of Social Gatherings with Friends and Family:   . Attends Religious Services:   . Active Member of Clubs or Organizations:   . Attends Archivist Meetings:   Marland Kitchen Marital Status:   Intimate Partner Violence:   . Fear of Current or Ex-Partner:   . Emotionally Abused:   Marland Kitchen Physically Abused:   . Sexually Abused:      Family History  Problem Relation Age of Onset  . Heart failure Mother   . Colon cancer Mother   . Diabetes Mother   . Thyroid disease Sister      Review of Systems Cannot assess Labs: No results for input(s): CKTOTAL, CKMB, TROPONINI in the last 72 hours. Lab Results  Component Value Date   WBC 6.7 01/12/2020   HGB 11.7 (L) 01/12/2020   HCT 37.2 (L) 01/12/2020   MCV 91.4 01/12/2020   PLT 181 01/12/2020    Recent  Labs  Lab 01/11/20 0656 01/11/20 0656 01/12/20 0457   NA 134*   < > 140  K 5.1   < > 4.5  CL 101   < > 105  CO2 21*   < > 23  BUN 34*   < > 31*  CREATININE 1.59*   < > 1.32*  CALCIUM 9.6   < > 9.7  PROT 9.4*  --   --   BILITOT 1.4*  --   --   ALKPHOS 82  --   --   ALT 9  --   --   AST 29  --   --   GLUCOSE 115*   < > 77   < > = values in this interval not displayed.   No results found for: CHOL, HDL, LDLCALC, TRIG No results found for: DDIMER  Radiology/Studies:  CT Head Wo Contrast  Result Date: 01/11/2020 CLINICAL DATA:  [place] EXAM: CT HEAD WITHOUT CONTRAST TECHNIQUE: Contiguous axial images were obtained from the base of the skull through the vertex without intravenous contrast. COMPARISON:  10/11/2019 head CT and prior. FINDINGS: Brain: No acute infarct or intracranial hemorrhage. Sequela of remote left occipital and left cerebellar insults. No mass lesion. No midline shift, ventriculomegaly or extra-axial fluid collection. Mild global cerebral atrophy with ex vacuo dilatation. Scattered and confluent hypodense foci involving the periventricular and subcortical white matter are nonspecific however commonly associated with chronic microvascular ischemic changes. Vascular: No hyperdense vessel. Bilateral skull base atherosclerotic calcifications. Skull: Negative for fracture or focal lesion. Sinuses/Orbits: Normal orbits. Clear paranasal sinuses. No mastoid effusion. Other: None. IMPRESSION: No acute intracranial process. Sequela of remote left occipital and left cerebellar insults. Mild cerebral atrophy and chronic microvascular ischemic changes. Electronically Signed   By: Primitivo Gauze M.D.   On: 01/11/2020 08:02   DG Chest Portable 1 View  Result Date: 01/11/2020 CLINICAL DATA:  Altered mental status EXAM: PORTABLE CHEST 1 VIEW COMPARISON:  11/15/2019 FINDINGS: Left-sided implanted cardiac device. Post CABG changes. Heart size is normal. Atherosclerotic calcification of the aortic knob. Mild streaky bibasilar opacities. No  pleural effusion or pneumothorax. Degenerative changes of the shoulders. IMPRESSION: Mild streaky bibasilar opacities, favor atelectasis. Electronically Signed   By: Davina Poke D.O.   On: 01/11/2020 07:57   DG Toe Great Left  Result Date: 01/11/2020 CLINICAL DATA:  Left great toe pain and swelling EXAM: LEFT GREAT TOE COMPARISON:  10/07/2019 FINDINGS: There is overlying soft tissue wound along the plantar and medial aspects of the left great toe distal phalanx. There is cortical destruction of the medial cortex of the great toe distal phalanx, new from prior, compatible with acute osteomyelitis. No fracture. No dislocation. Similar degenerative changes. Advanced small vessel atherosclerotic calcification. No tracking soft tissue gas. IMPRESSION: 1. Cortical destruction of the medial cortex of the left great toe distal phalanx, new from prior, compatible with acute osteomyelitis. Overlying soft tissue wound noted. No tracking soft tissue gas. 2. Advanced small vessel atherosclerotic calcification. Electronically Signed   By: Davina Poke D.O.   On: 01/11/2020 07:59    EKG: Normal sinus rhythm possible inferior septal infarct age undetermined  Weights: Filed Weights   01/12/20 0500  Weight: 79.5 kg     Physical Exam: Blood pressure 133/80, pulse 100, temperature 98.2 F (36.8 C), resp. rate 20, weight 79.5 kg, SpO2 97 %. Body mass index is 25.88 kg/m. General: Well developed, well nourished, and unresponsive Head eyes ears nose throat: Normocephalic, atraumatic, sclera non-icteric, no xanthomas,  nares are without discharge. No apparent thyromegaly and/or mass  Lungs: Normal respiratory effort.  no wheezes, no rales, no rhonchi.  Heart: RRR with normal S1 S2. no murmur gallop, no rub, PMI is normal size and placement, carotid upstroke normal without bruit, jugular venous pressure is normal Abdomen: Soft, non-tender, non-distended with normoactive bowel sounds. No hepatomegaly. No  rebound/guarding. No obvious abdominal masses. Abdominal aorta is normal size without bruit Extremities: Trace edema. no cyanosis, no clubbing, positive ulcers  Peripheral : 2+ bilateral upper extremity pulses, 2+ bilateral femoral pulses, with left great toe infection Neuro: Not alert and oriented. No facial asymmetry. No focal deficit. Moves all extremities spontaneously. Musculoskeletal: Normal muscle tone without kyphosis Psych: Does not responds to questions appropriately with a normal affect.    Assessment: 69 year old male with coronary artery disease mild systolic dysfunction congestive heart failure which is chronic peripheral vascular disease hypertension hyperlipidemia all apparently stable at this time with no evidence of acute coronary syndrome congestive heart failure having acute mental status changes which have not cleared and a left great toe infection needing further intervention.  Patient is currently is at lowest risk possible for cardiovascular complication with surgical intervention  Plan: 1.  Proceed to surgical intervention of left great toe infection without restriction due to low risk and no current evidence of congestive heart failure acute coronary syndrome or angina 2.  Continue supportive care for acute mental status changes 3.  No further cardiac intervention and orders diagnostics necessary at this time 4.  Continue high intensity cholesterol therapy for cardiovascular risk reduction 5.  Continue hypertension control and treatment of cardiomyopathy and stable heart failure with lisinopril and carvedilol without change 6.  Torsemide for stabilization of chronic systolic dysfunction heart failure 7.  Rehab from mental status changes and above chronic cardiovascular symptoms without restriction 8.  Reinstatement of anticoagulation when patient completely recovered from above but hold for now for risk reduction and bleeding complications of surgical intervention 9.   Call if further questions or need for other management of issues listed above as per Dr. Saralyn Pilar is available this weekend  Signed, Corey Skains M.D. [facility] Cardiology 01/12/2020, 9:04 AM

## 2020-01-12 NOTE — Anesthesia Preprocedure Evaluation (Addendum)
Anesthesia Evaluation  Patient identified by MRN, date of birth, ID band Patient confused    Reviewed: Allergy & Precautions, H&P , NPO status , Patient's Chart, lab work & pertinent test results  History of Anesthesia Complications Negative for: history of anesthetic complications  Airway Mallampati: III  TM Distance: <3 FB Neck ROM: limited    Dental  (+) Chipped, Missing, Poor Dentition   Pulmonary neg pulmonary ROS, neg shortness of breath,    Pulmonary exam normal        Cardiovascular hypertension, + CAD, + Past MI, + Peripheral Vascular Disease and +CHF  Normal cardiovascular exam     Neuro/Psych CVA (right sided weakness), Residual Symptoms negative psych ROS   GI/Hepatic Neg liver ROS, GERD  Medicated and Controlled,  Endo/Other  diabetes, Type 2  Renal/GU Renal disease     Musculoskeletal   Abdominal   Peds  Hematology negative hematology ROS (+)   Anesthesia Other Findings Patient has cardiac clearance for this procedure.   Past Medical History: No date: Allergy No date: Atrial fibrillation ([facility]) No date: B12 deficiency No date: Chronic kidney disease No date: Chronic kidney disease (CKD), stage II (mild) No date: Coronary atherosclerosis No date: Diabetes mellitus without complication ([facility]) No date: Elevated PSA No date: Esophagitis No date: GERD (gastroesophageal reflux disease) No date: Heart attack ([place]) No date: Heart disease No date: Hyperlipidemia No date: Hypertension No date: Hypokalemia No date: Mild neurocognitive disorder No date: Osteoarthritis No date: Presbyopia No date: PVD (peripheral vascular disease) ([facility]) No date: Restless leg syndrome No date: Stroke (cerebrum) ([facility]) No date: Stroke ([place]) No date: Subacute osteomyelitis ([place]) No date: Tinea unguium No date: Uncompensated short term memory deficit  Past Surgical History: No date: CARDIAC PACEMAKER  PLACEMENT 05/18/2018: COLONOSCOPY WITH PROPOFOL; N/A     Comment:  Procedure: COLONOSCOPY WITH PROPOFOL;  Surgeon: [name], MD;  Location: [facility] ENDOSCOPY;  Service:               Endoscopy;  Laterality: N/A; No date: CORONARY ARTERY BYPASS GRAFT No date: FOOT AMPUTATION; Right 11/16/2019: INTRAMEDULLARY (IM) NAIL INTERTROCHANTERIC; Right     Comment:  Procedure: INTRAMEDULLARY (IM) NAIL INTERTROCHANTRIC;                Surgeon: Thornton Park, MD;  Location: [facility] ORS;                Service: Orthopedics;  Laterality: Right; 10/10/2019: LOWER EXTREMITY ANGIOGRAPHY; Left     Comment:  Procedure: Lower Extremity Angiography;  Surgeon: Algernon Huxley, MD;  Location: [place] CV LAB;  Service:               Cardiovascular;  Laterality: Left;     Reproductive/Obstetrics negative OB ROS                             Anesthesia Physical Anesthesia Plan  ASA: IV  Anesthesia Plan: General   Post-op Pain Management:    Induction: Intravenous  PONV Risk Score and Plan: Propofol infusion and TIVA  Airway Management Planned: Natural Airway and Nasal Cannula  Additional Equipment:   Intra-op Plan:   Post-operative Plan:   Informed Consent: I have reviewed the patients History and Physical, chart, labs and discussed the procedure including the risks,  benefits and alternatives for the proposed anesthesia with the patient or authorized representative who has indicated his/her understanding and acceptance.     Dental Advisory Given  Plan Discussed with: Anesthesiologist, CRNA and Surgeon  Anesthesia Plan Comments: (History and phone consent from the patients daughter and POA Donah Driver at [phone]   Daugher consented for risks of anesthesia including but not limited to:  - adverse reactions to medications - risk of intubation if required - damage to eyes, teeth, lips or other oral mucosa - nerve damage due to  positioning  - sore throat or hoarseness - Damage to heart, brain, nerves, lungs, other parts of body or loss of life  She voiced understanding.)       Anesthesia Quick Evaluation

## 2020-01-12 NOTE — Progress Notes (Signed)
[name] [place] NAME: Jason Montes    MR#:  [id number]  DATE OF BIRTH:  June 30, 1950  SUBJECTIVE:   Patient very confused. Knows his name. Disoriented to time and person. Has sitter in the room unable to get any history review of systems. REVIEW OF SYSTEMS:   Review of Systems  Unable to perform ROS: Mental acuity   Tolerating Diet: Tolerating PT: pending  DRUG ALLERGIES:   Allergies  Allergen Reactions  . Amlodipine Other (See Comments)  . Norvasc [Amlodipine Besylate]     Unknown  . Penicillins     Childhood allergy, not sure what happens     VITALS:  Blood pressure 130/86, pulse 99, temperature 97.8 F (36.6 C), temperature source Axillary, resp. rate 20, weight 79.5 kg, SpO2 98 %.  PHYSICAL EXAMINATION:   Physical Exam  General: Not in acute distress, ocnfused       Cardiac: S1/S2, RRR, No murmurs, No gallops or rubs.Tele NSR Respiratory:  No rales, wheezing, rhonchi or rubs. GI: Soft, nondistended, nontender, no organomegaly, BS present. Ext: No pitting leg edema bilaterally. 1+DP/PT pulse bilaterally.  All toes are missing in the right foot. Has ulcer in the plantar aspect of left great toe, without active draining.  No surrounding erythema. Musculoskeletal: No joint deformities, No joint redness or warmth, no limitation of ROM in spin. Skin: No rashes.  Neuro: Confused, knows his own name, not oriented X3 to place and time, cranial nerves II-XII grossly intact, moves all extremities Psych: confused  LABORATORY PANEL:  CBC Recent Labs  Lab 01/12/20 0457  WBC 6.7  HGB 11.7*  HCT 37.2*  PLT 181    Chemistries  Recent Labs  Lab 01/11/20 0656 01/11/20 0656 01/12/20 0457  NA 134*   < > 140  K 5.1   < > 4.5  CL 101   < > 105  CO2 21*   < > 23  GLUCOSE 115*   < > 77  BUN 34*   < > 31*  CREATININE 1.59*   < > 1.32*  CALCIUM 9.6   < > 9.7  AST 29  --   --   ALT 9  --   --   ALKPHOS 82  --   --    BILITOT 1.4*  --   --    < > = values in this interval not displayed.   Cardiac Enzymes No results for input(s): TROPONINI in the last 168 hours. RADIOLOGY:  CT Head Wo Contrast  Result Date: 01/11/2020 CLINICAL DATA:  Delirium EXAM: CT HEAD WITHOUT CONTRAST TECHNIQUE: Contiguous axial images were obtained from the base of the skull through the vertex without intravenous contrast. COMPARISON:  10/11/2019 head CT and prior. FINDINGS: Brain: No acute infarct or intracranial hemorrhage. Sequela of remote left occipital and left cerebellar insults. No mass lesion. No midline shift, ventriculomegaly or extra-axial fluid collection. Mild global cerebral atrophy with ex vacuo dilatation. Scattered and confluent hypodense foci involving the periventricular and subcortical white matter are nonspecific however commonly associated with chronic microvascular ischemic changes. Vascular: No hyperdense vessel. Bilateral skull base atherosclerotic calcifications. Skull: Negative for fracture or focal lesion. Sinuses/Orbits: Normal orbits. Clear paranasal sinuses. No mastoid effusion. Other: None. IMPRESSION: No acute intracranial process. Sequela of remote left occipital and left cerebellar insults. Mild cerebral atrophy and chronic microvascular ischemic changes. Electronically Signed   By: Primitivo Gauze M.D.   On: 01/11/2020 08:02   DG Chest Portable  1 View  Result Date: 01/11/2020 CLINICAL DATA:  Altered mental status EXAM: PORTABLE CHEST 1 VIEW COMPARISON:  11/15/2019 FINDINGS: Left-sided implanted cardiac device. Post CABG changes. Heart size is normal. Atherosclerotic calcification of the aortic knob. Mild streaky bibasilar opacities. No pleural effusion or pneumothorax. Degenerative changes of the shoulders. IMPRESSION: Mild streaky bibasilar opacities, favor atelectasis. Electronically Signed   By: Davina Poke D.O.   On: 01/11/2020 07:57   DG Toe Great Left  Result Date: 01/11/2020 CLINICAL  DATA:  Left great toe pain and swelling EXAM: LEFT GREAT TOE COMPARISON:  10/07/2019 FINDINGS: There is overlying soft tissue wound along the plantar and medial aspects of the left great toe distal phalanx. There is cortical destruction of the medial cortex of the great toe distal phalanx, new from prior, compatible with acute osteomyelitis. No fracture. No dislocation. Similar degenerative changes. Advanced small vessel atherosclerotic calcification. No tracking soft tissue gas. IMPRESSION: 1. Cortical destruction of the medial cortex of the left great toe distal phalanx, new from prior, compatible with acute osteomyelitis. Overlying soft tissue wound noted. No tracking soft tissue gas. 2. Advanced small vessel atherosclerotic calcification. Electronically Signed   By: Davina Poke D.O.   On: 01/11/2020 07:59   ASSESSMENT AND PLAN:  Jason Montes is a 69 y.o. male with medical history significant of hypertension, hyperlipidemia, diabetes mellitus, stroke, GERD, depression, RLS, PVD, CAD, CKD-3, atrial fibrillation on Eliquis, chronic left greater toe osteomyelitis, sCHF with EF of 40-45%, who presents with altered mental status and overdose ?unintentional.  Acute metabolic encephalopathy: CT head is negative for acute intracranial abnormalities, most likely due to overdose.  Per his daughter(spoke  with admitting MD), patient overdosed of tramadol, Flexeril and gabapentin.  Patient also has several other sedative medication on his list, including Norco, Remeron, Zyrtec. -Hold all sedative medications including  tramadol, Flexeril, gabapentin, Norco, Remeron, Zyrtec. -Sitter +  Possible Suicidal behavior and a history of depression -consulted Dr. Janese Banks of psychiatry -Suicidal precaution -Hold Remeron due to overdose -Sitter+  Osteomyelitis of great toe of left foot: Patient does not have fever or leukocytosis.  Lactic acid is normal.  Clinically not septic.  Will hold off antibiotics. -  blood culture--so far negative -consulted Dr. Melton Alar of podiatry foramputation-- scheduled for august 13 th -[facility], Dr. Nehemiah Massed-- per him patient is at a low risk for surgery.  Atrial fibrillation ([place]) -patient apparently remains in sinus rhythm. Currently he is not taking eliquis since December 23, 2019 (per med rec info and Cardiology note) -coreg  CAD (coronary artery disease): -ASA, lipitor, -prn NTG  GERD with esophagitis -Pepcid  Chronic systolic CHF (congestive heart failure) ([place]): 2D echo on 11/15/2019 showed EF of 40-45% patient does not have leg edema or JVD.  CHF symptoms compensated. -Continue home torsemide -Check BNP -->316  Hyperlipidemia -lipitor  Essential hypertension -IV hydralazine as needed -Continue Coreg and lisinopril  Stage 3a chronic kidney disease: Stable -Follow-up of BMP  Type II diabetes mellitus with renal manifestations [facility]): Recent A1c 11.4, poorly controlled.  Patient is taking semaglutide, Jardiance, Metformin -Sliding scale insulin  Stroke ([place]) -Continue aspirin, Lipitor  Procedures: Family communication : Dr. Vickki Muff has spoken with daughter Nira Conn Who is patient's POA regarding scheduled surgery for tomorrow Consults : podiatry, psychiatry, cardiology CODE STATUS: DNR prior to admission DVT Prophylaxis :SCD for now  Status is: Inpatient  Remains inpatient appropriate because:Altered mental status   Dispo: The patient is from: Home  Anticipated d/c is to: TBD              Anticipated d/c date is: 2 days              Patient currently is not medically stable to d/c.  Patient has altered mental status. Psychiatry consultation pending. He is scheduled for left toe amputation tomorrow. TOC for discharge planning      TOTAL TIME TAKING CARE OF THIS PATIENT: *25* minutes.  >50% time spent on counselling and coordination of care  Note: This dictation was prepared with Dragon dictation  along with smaller phrase technology. Any transcriptional errors that result from this process are unintentional.  Fritzi Mandes M.D    [facility]   CC[facility] Primary care physician; [facility] [place] [facility]: Shelbie Ammons, male   DOB: August 11, 1950, 69 y.o.   MRN: [id number]

## 2020-01-13 ENCOUNTER — Inpatient Hospital Stay: Payer: Medicare Other

## 2020-01-13 ENCOUNTER — Inpatient Hospital Stay: Payer: Medicare Other | Admitting: Urgent Care

## 2020-01-13 ENCOUNTER — Ambulatory Visit: Admission: RE | Admit: 2020-01-13 | Payer: Medicare Other | Source: Ambulatory Visit | Admitting: Podiatry

## 2020-01-13 ENCOUNTER — Other Ambulatory Visit (INDEPENDENT_AMBULATORY_CARE_PROVIDER_SITE_OTHER): Payer: Self-pay | Admitting: Vascular Surgery

## 2020-01-13 ENCOUNTER — Encounter
Admission: EM | Disposition: A | Discharge status: Rehabilitation Facility | Payer: Self-pay | Source: Home / Self Care | Attending: Internal Medicine

## 2020-01-13 DIAGNOSIS — N189 Chronic kidney disease, unspecified: Secondary | ICD-10-CM

## 2020-01-13 DIAGNOSIS — M86272 Subacute osteomyelitis, left ankle and foot: Secondary | ICD-10-CM

## 2020-01-13 DIAGNOSIS — J189 Pneumonia, unspecified organism: Secondary | ICD-10-CM

## 2020-01-13 HISTORY — PX: AMPUTATION TOE: SHX6595

## 2020-01-13 LAB — GLUCOSE, CAPILLARY
Glucose-Capillary: 146 mg/dL — ABNORMAL HIGH (ref 70–99)
Glucose-Capillary: 167 mg/dL — ABNORMAL HIGH (ref 70–99)
Glucose-Capillary: 236 mg/dL — ABNORMAL HIGH (ref 70–99)

## 2020-01-13 LAB — URINE DRUG SCREEN, QUALITATIVE (ARMC ONLY)
Amphetamines, Ur Screen: NOT DETECTED
Barbiturates, Ur Screen: NOT DETECTED
Benzodiazepine, Ur Scrn: NOT DETECTED
Cannabinoid 50 Ng, Ur ~~LOC~~: NOT DETECTED
Cocaine Metabolite,Ur ~~LOC~~: NOT DETECTED
MDMA (Ecstasy)Ur Screen: NOT DETECTED
Methadone Scn, Ur: NOT DETECTED
Opiate, Ur Screen: NOT DETECTED
Phencyclidine (PCP) Ur S: NOT DETECTED
Tricyclic, Ur Screen: POSITIVE — AB

## 2020-01-13 SURGERY — AMPUTATION, TOE
Anesthesia: General | Site: Toe | Laterality: Left

## 2020-01-13 MED ORDER — KETAMINE HCL 50 MG/ML IJ SOLN
INTRAMUSCULAR | Status: DC | PRN
  Administered 2020-01-13: 25 mg via INTRAMUSCULAR

## 2020-01-13 MED ORDER — HALOPERIDOL LACTATE 5 MG/ML IJ SOLN: 1.0000 mg | Freq: Four times a day (QID) | INTRAMUSCULAR | Status: DC | PRN

## 2020-01-13 MED ORDER — LORAZEPAM 2 MG/ML IJ SOLN
1.0000 mg | Freq: Four times a day (QID) | INTRAMUSCULAR | Status: DC | PRN
  Administered 2020-01-13 – 2020-01-17 (×5): 1 mg via INTRAVENOUS
  Filled 2020-01-13 (×5): qty 1

## 2020-01-13 MED ORDER — NEOMYCIN-POLYMYXIN B GU 40-200000 IR SOLN
Status: DC | PRN
  Administered 2020-01-13: 2 mL

## 2020-01-13 MED ORDER — DEXMEDETOMIDINE HCL IN NACL 400 MCG/100ML IV SOLN
INTRAVENOUS | Status: DC | PRN
Start: 2020-01-13 — End: 2020-01-13
  Administered 2020-01-13: 12 ug via INTRAVENOUS

## 2020-01-13 MED ORDER — ENOXAPARIN SODIUM 40 MG/0.4ML ~~LOC~~ SOLN
40.0000 mg | SUBCUTANEOUS | Status: DC
  Administered 2020-01-13: 40 mg via SUBCUTANEOUS
  Filled 2020-01-13: qty 0.4

## 2020-01-13 MED ORDER — PROPOFOL 500 MG/50ML IV EMUL
INTRAVENOUS | Status: DC | PRN
  Administered 2020-01-13: 125 ug/kg/min via INTRAVENOUS

## 2020-01-13 MED ORDER — LIDOCAINE HCL (CARDIAC) PF 100 MG/5ML IV SOSY
PREFILLED_SYRINGE | INTRAVENOUS | Status: DC | PRN
  Administered 2020-01-13: 30 mg via INTRAVENOUS
  Administered 2020-01-13: 10 mg via INTRAVENOUS

## 2020-01-13 MED ORDER — SODIUM CHLORIDE 0.9 % IV SOLN: INTRAVENOUS | Status: DC | PRN

## 2020-01-13 MED ORDER — BUPIVACAINE HCL 0.25 % IJ SOLN
INTRAMUSCULAR | Status: DC | PRN
  Administered 2020-01-13: 10 mL

## 2020-01-13 MED ORDER — PHENYLEPHRINE HCL (PRESSORS) 10 MG/ML IV SOLN
INTRAVENOUS | Status: DC | PRN
  Administered 2020-01-13 (×3): 100 ug via INTRAVENOUS

## 2020-01-13 MED ORDER — CLINDAMYCIN PHOSPHATE 900 MG/50ML IV SOLN
900.0000 mg | INTRAVENOUS | Status: AC
  Administered 2020-01-13: 900 mg via INTRAVENOUS
  Filled 2020-01-13: qty 50

## 2020-01-13 MED ORDER — FENTANYL CITRATE (PF) 100 MCG/2ML IJ SOLN: 25.0000 ug | INTRAMUSCULAR | Status: DC | PRN

## 2020-01-13 MED ORDER — BUPIVACAINE HCL 0.5 % IJ SOLN: INTRAMUSCULAR | Status: DC | PRN

## 2020-01-13 MED ORDER — OXYCODONE HCL 5 MG/5ML PO SOLN: 5.0000 mg | Freq: Once | ORAL | Status: DC | PRN

## 2020-01-13 MED ORDER — OXYCODONE HCL 5 MG PO TABS: 5.0000 mg | ORAL_TABLET | Freq: Once | ORAL | Status: DC | PRN

## 2020-01-13 SURGICAL SUPPLY — 49 items
BLADE MED AGGRESSIVE (BLADE) ×3 IMPLANT
BLADE SURG 15 STRL LF DISP TIS (BLADE) IMPLANT
BLADE SURG 15 STRL SS (BLADE)
BLADE SURG MINI STRL (BLADE) IMPLANT
BNDG CMPR 75X41 PLY HI ABS (GAUZE/BANDAGES/DRESSINGS) ×1
BNDG CONFORM 2 STRL LF (GAUZE/BANDAGES/DRESSINGS) IMPLANT
BNDG ELASTIC 4X5.8 VLCR STR LF (GAUZE/BANDAGES/DRESSINGS) ×3 IMPLANT
BNDG ESMARK 4X12 TAN STRL LF (GAUZE/BANDAGES/DRESSINGS) ×3 IMPLANT
BNDG GAUZE 4.5X4.1 6PLY STRL (MISCELLANEOUS) ×3 IMPLANT
BNDG STRETCH 4X75 STRL LF (GAUZE/BANDAGES/DRESSINGS) ×2 IMPLANT
CANISTER SUCT 1200ML W/VALVE (MISCELLANEOUS) ×3 IMPLANT
CNTNR SPEC 2.5X3XGRAD LEK (MISCELLANEOUS) ×1
CONT SPEC 4OZ STER OR WHT (MISCELLANEOUS) ×2
CONT SPEC 4OZ STRL OR WHT (MISCELLANEOUS) ×1
CONTAINER SPEC 2.5X3XGRAD LEK (MISCELLANEOUS) ×1 IMPLANT
CORD ROBOTIC BIPOLAR REUSE 17F (INSTRUMENTS) IMPLANT
COVER WAND RF STERILE (DRAPES) ×3 IMPLANT
DRAPE FLUOR MINI C-ARM 54X84 (DRAPES) IMPLANT
DURAPREP 26ML APPLICATOR (WOUND CARE) ×3 IMPLANT
ELECT REM PT RETURN 9FT ADLT (ELECTROSURGICAL) ×3
ELECTRODE REM PT RTRN 9FT ADLT (ELECTROSURGICAL) ×1 IMPLANT
FORCEPS JEWEL BIP 4-3/4 STR (INSTRUMENTS) ×2 IMPLANT
GAUZE SPONGE 4X4 12PLY STRL (GAUZE/BANDAGES/DRESSINGS) ×4 IMPLANT
GAUZE XEROFORM 1X8 LF (GAUZE/BANDAGES/DRESSINGS) ×3 IMPLANT
GLOVE BIO SURGEON STRL SZ7 (GLOVE) ×3 IMPLANT
GLOVE INDICATOR 7.0 STRL GRN (GLOVE) ×3 IMPLANT
GOWN STRL REUS W/ TWL LRG LVL3 (GOWN DISPOSABLE) ×2 IMPLANT
GOWN STRL REUS W/TWL LRG LVL3 (GOWN DISPOSABLE) ×6
KIT TURNOVER KIT A (KITS) ×3 IMPLANT
LABEL OR SOLS (LABEL) ×3 IMPLANT
NDL FILTER BLUNT 18X1 1/2 (NEEDLE) ×1 IMPLANT
NDL HYPO 25X1 1.5 SAFETY (NEEDLE) ×2 IMPLANT
NEEDLE FILTER BLUNT 18X 1/2SAF (NEEDLE) ×2
NEEDLE FILTER BLUNT 18X1 1/2 (NEEDLE) ×1 IMPLANT
NEEDLE HYPO 25X1 1.5 SAFETY (NEEDLE) ×6 IMPLANT
NS IRRIG 500ML POUR BTL (IV SOLUTION) ×3 IMPLANT
PACK EXTREMITY (MISCELLANEOUS) ×3 IMPLANT
PAD ABD DERMACEA PRESS 5X9 (GAUZE/BANDAGES/DRESSINGS) ×2 IMPLANT
SOL .9 NS 3000ML IRR  AL (IV SOLUTION)
SOL .9 NS 3000ML IRR AL (IV SOLUTION)
SOL .9 NS 3000ML IRR UROMATIC (IV SOLUTION) IMPLANT
SOL PREP PVP 2OZ (MISCELLANEOUS) ×3
SOLUTION PREP PVP 2OZ (MISCELLANEOUS) ×1 IMPLANT
STOCKINETTE STRL 6IN 960660 (GAUZE/BANDAGES/DRESSINGS) ×3 IMPLANT
SUT ETHILON 3-0 FS-10 30 BLK (SUTURE) ×6
SUT VIC AB 3-0 SH 27 (SUTURE) ×3
SUT VIC AB 3-0 SH 27X BRD (SUTURE) ×1 IMPLANT
SUTURE EHLN 3-0 FS-10 30 BLK (SUTURE) ×2 IMPLANT
SYR 10ML LL (SYRINGE) ×3 IMPLANT

## 2020-01-13 NOTE — Anesthesia Procedure Notes (Signed)
Procedure Name: MAC Date/Time: 01/13/2020 7:43 AM Performed by: Genevie Ann, CRNA Pre-anesthesia Checklist: Patient identified, Emergency Drugs available, Suction available, Patient being monitored and Timeout performed Oxygen Delivery Method: Nasal cannula

## 2020-01-13 NOTE — OR Nursing (Signed)
After patient was moved to the OR bed he stated he had to "pee". In and out cath placed sterilely 441ml yellow urine remove

## 2020-01-13 NOTE — Transfer of Care (Signed)
Immediate Anesthesia Transfer of Care Note  Patient: Jason Montes  Procedure(s) Performed: AMPUTATION RAY LEFT 1ST (Left Toe)  Patient Location: PACU  Anesthesia Type:MAC  Level of Consciousness: sedated  Airway & Oxygen Therapy: Patient Spontanous Breathing and Patient connected to nasal cannula oxygen  Post-op Assessment: Report given to RN and Post -op Vital signs reviewed and stable  Post vital signs: Reviewed and stable  Last Vitals:  Vitals Value Taken Time  BP 96/81 01/13/20 0833  Temp    Pulse 102 01/13/20 0837  Resp 17 01/13/20 0837  SpO2 97 % 01/13/20 0837  Vitals shown include unvalidated device data.  Last Pain:  Vitals:   01/12/20 2100  TempSrc:   PainSc: 0-No pain         Complications: No complications documented.

## 2020-01-13 NOTE — Progress Notes (Signed)
[facility] [place] NAME: Jason Montes    MR#:  [id number]  DATE OF BIRTH:  01-31-1951  SUBJECTIVE:   Patient very confused. Knows his name. Disoriented to time and person. Has sitter in the room Talking to himself, restless, trying to get out of bed unable to get any history review of systems. REVIEW OF SYSTEMS:   Review of Systems  Unable to perform ROS: Mental acuity   Tolerating Diet: Tolerating PT: pending  DRUG ALLERGIES:   Allergies  Allergen Reactions  . Amlodipine Other (See Comments)  . Norvasc [Amlodipine Besylate]     Unknown  . Penicillins     Childhood allergy, not sure what happens     VITALS:  Blood pressure (!) 106/59, pulse 97, temperature 97.9 F (36.6 C), resp. rate 18, weight 75.8 kg, SpO2 94 %.  PHYSICAL EXAMINATION:   Physical Exam  General: Not in acute distress, ocnfused       Cardiac: S1/S2, RRR, No murmurs, No gallops or rubs.Tele NSR Respiratory:  No rales, wheezing, rhonchi or rubs. GI: Soft, nondistended, nontender, no organomegaly, BS present. Ext: No pitting leg edema bilaterally. 1+DP/PT pulse bilaterally.  All toes are missing in the right foot. Has ulcer in the plantar aspect of left great toe, without active draining.  No surrounding erythema. Musculoskeletal: No joint deformities, No joint redness or warmth, no limitation of ROM in spin. Skin: No rashes.  Neuro: Confused, knows his own name, not oriented X3 to place and time, cranial nerves II-XII grossly intact, moves all extremities Psych: confused  LABORATORY PANEL:  CBC Recent Labs  Lab 01/12/20 0457  WBC 6.7  HGB 11.7*  HCT 37.2*  PLT 181    Chemistries  Recent Labs  Lab 01/11/20 0656 01/11/20 0656 01/12/20 0457  NA 134*   < > 140  K 5.1   < > 4.5  CL 101   < > 105  CO2 21*   < > 23  GLUCOSE 115*   < > 77  BUN 34*   < > 31*  CREATININE 1.59*   < > 1.32*  CALCIUM 9.6   < > 9.7  AST 29  --   --   ALT 9  --    --   ALKPHOS 82  --   --   BILITOT 1.4*  --   --    < > = values in this interval not displayed.   Cardiac Enzymes No results for input(s): TROPONINI in the last 168 hours. RADIOLOGY:  DG Foot 2 Views Left  Result Date: 01/13/2020 CLINICAL DATA:  Status post amputation. EXAM: LEFT FOOT - 2 VIEW COMPARISON:  01/11/2020 FINDINGS: Status post amputation of the left great toe. The first metatarsal is intact. No acute bony findings. Extensive vascular calcifications. IMPRESSION: Status post amputation of the left great toe. Electronically Signed   By: Marijo Sanes M.D.   On: 01/13/2020 10:54   DG MINI C-ARM IMAGE ONLY  Result Date: 01/13/2020 There is no interpretation for this exam.  This order is for images obtained during a surgical procedure.  Please See "Surgeries" Tab for more information regarding the procedure.   ASSESSMENT AND PLAN:  Jason Montes is a 69 y.o. male with medical history significant of hypertension, hyperlipidemia, diabetes mellitus, stroke, GERD, depression, RLS, PVD, CAD, CKD-3, atrial fibrillation on Eliquis, chronic left greater toe osteomyelitis, sCHF with EF of 40-45%, who presents with altered mental status and overdose ?  unintentional.  Acute metabolic encephalopathy: CT head is negative for acute intracranial abnormalities, most likely due to overdose.  Per his daughter(spoke  with admitting MD), patient overdosed of tramadol, Flexeril and gabapentin.  Patient also has several other sedative medication on his list, including Norco, Remeron, Zyrtec. -Hold all sedative medications including  tramadol, Flexeril, gabapentin, Norco, Remeron, Zyrtec. -Sitter + -Psychiatry consulted on 01/11/2020--awaiting recomendations --pt has h/o short term memory loss and CVA  Possible Suicidal behavior and a history of depression -consulted Dr. Janese Banks of psychiatry -Suicidal precaution -Sitter+  Osteomyelitis of great toe of left foot: - Patient does not have fever or  leukocytosis.  Lactic acid is normal.  Clinically not septic.  Will hold off antibiotics. - blood culture--so far negative -8/13--s/p-Left hallux amputation by Dr Luana Shu -8/13-vascular surgery consultation placed by podiatry for left lower extremity angiogram to determine revascularization and healing process for left foot. This will be done on Monday.  Atrial fibrillation ([place]) -patient apparently remains in sinus rhythm. Currently he is not taking eliquis since December 23, 2019 (per med rec info and Cardiology note) - cont coreg  CAD (coronary artery disease): -ASA, lipitor, -prn NTG  GERD with esophagitis -Pepcid  Chronic systolic CHF (congestive heart failure) ([place]): 2D echo on 11/15/2019 showed EF of 40-45% patient does not have leg edema or JVD.  CHF symptoms compensated. -Continue home torsemide -BNP -->316  Hyperlipidemia -lipitor  Essential hypertension -IV hydralazine as needed -Continue Coreg and lisinopril  Stage 3a chronic kidney disease: Stable -Follow-up of BMP  Type II diabetes mellitus with renal manifestations [facility]): Recent A1c 11.4, poorly controlled. -  Patient is taking semaglutide, Jardiance, Metformin -Sliding scale insulin -sugars are stable and with confusion pt's intake is not the best. Resume po meds when appropriate  Stroke ([place]) -Continue aspirin, Lipitor  Procedures: s/p left hallux amputation Family communication : Dr. Vickki Muff has spoken with daughter Nira Conn Who is patient's POA regarding scheduled surgery for tomorrow Consults : podiatry, psychiatry, cardiology CODE STATUS: DNR prior to admission DVT Prophylaxis :lovenox  Status is: Inpatient  Remains inpatient appropriate because:Altered mental status   Dispo: The patient is from: Home              Anticipated d/c is to: TBD              Anticipated d/c date is: 2 days              Patient currently is not medically stable to d/c.  Pt is POD amputation today Needs Psychiatry  evaluation Need to work with PT/OT TOC for discharge planning      TOTAL TIME TAKING CARE OF THIS PATIENT: *25* minutes.  >50% time spent on counselling and coordination of care  Note: This dictation was prepared with Dragon dictation along with smaller phrase technology. Any transcriptional errors that result from this process are unintentional.  Fritzi Mandes M.D    [facility]   CC: Primary care physician; [facility] [place] [facility], [facility]   DOB: [birth year], 69 y.o.   MRN: [id number]

## 2020-01-13 NOTE — H&P (View-Only) (Signed)
[facility] Vascular Consult Note  MRN : [id number]  Jason Montes is a 69 y.o. ([birth year]) male who presents with chief complaint of  Chief Complaint  Patient presents with  . Altered Mental Status   History of Present Illness: Jason Montes is a 69 year old male with medical history significant of hypertension, hyperlipidemia, diabetes mellitus, stroke, GERD, depression, RLS, PVD, CAD, CKD-3, atrial fibrillation on Eliquis, chronic left greater toe osteomyelitis, sCHF with EF of 40-45%, who presents with altered mental status and overdose.  Per his daughter, patient overdosed tramadol, flexeril and gabapentin.It is unclear how much pt actually took.  His daughter states that this is most likely due to suicidal attempt. Pt is confused with slurring speech. He moves all extremities.  No facial droop. Patient does not have active cough, respiratory distress, nausea, vomiting, diarrhea.  Patient does not seem to have chest pain or abdominal pain.  Not sure if patient has symptoms of UTI.   The patient has chronic left great toe osteomyelitis. Today, the patient underwent left hallux osteomyelitis with bone exposed at IPJ ulceration.  During the procedure, Dr. Luana Shu noted minimal intraoperative bleeding.  On 10/10/19, the patient underwent: 1. Ultrasound guidance for vascular access right femoral artery 2. Catheter placement into left common femoral artery from right femoral approach 3. Aortogram and selective left lower extremity angiogram 4. Percutaneous transluminal angioplasty of left anterior tibial artery with 3 mm diameter angioplasty balloon 5.  Percutaneous transluminal angioplasty of left peroneal artery with 2.5 mm diameter angioplasty balloon             6.  Percutaneous transluminal angioplasty of left popliteal artery with 5 mm diameter Lutonix drug-coated angioplasty  balloon 7. StarClose closure device right femoral artery  Vascular surgery was consulted by Dr. Luana Shu for possible endovascular intervention  Current Facility-Administered Medications  Medication Dose Route Frequency Provider Last Rate Last Admin  . acetaminophen (TYLENOL) tablet 650 mg  650 mg Oral Q6H PRN Caroline More, DPM   650 mg at 01/12/20 1406   Or  . acetaminophen (TYLENOL) suppository 650 mg  650 mg Rectal Q6H PRN Caroline More, DPM      . aspirin EC tablet 81 mg  81 mg Oral Daily Caroline More, DPM   81 mg at 01/13/20 1139  . atorvastatin (LIPITOR) tablet 40 mg  40 mg Oral QHS Caroline More, DPM   40 mg at 01/12/20 2214  . carvedilol (COREG) tablet 3.125 mg  3.125 mg Oral BID WC Caroline More, DPM   3.125 mg at 01/13/20 1139  . cholecalciferol (VITAMIN D3) tablet 1,000 Units  1,000 Units Oral Daily Caroline More, DPM   1,000 Units at 01/13/20 1138  . enoxaparin (LOVENOX) injection 40 mg  40 mg Subcutaneous Q24H Fritzi Mandes, MD      . famotidine (PEPCID) tablet 20 mg  20 mg Oral Daily Caroline More, DPM   20 mg at 01/13/20 1139  . fluticasone (FLONASE) 50 MCG/ACT nasal spray 2 spray  2 spray Each Nare Daily Caroline More, DPM   2 spray at 01/13/20 1139  . hydrALAZINE (APRESOLINE) injection 5 mg  5 mg Intravenous Q2H PRN Caroline More, DPM      . insulin aspart (novoLOG) injection 0-5 Units  0-5 Units Subcutaneous QHS Caroline More, DPM   1 Units at 01/12/20 2214  . insulin aspart (novoLOG) injection 0-9 Units  0-9 Units Subcutaneous TID WC Caroline More, DPM   6 Units at 01/13/20 1137  .  lisinopril (ZESTRIL) tablet 2.5 mg  2.5 mg Oral Daily Caroline More, DPM   2.5 mg at 01/13/20 1138  . LORazepam (ATIVAN) injection 1 mg  1 mg Intravenous Q6H PRN Fritzi Mandes, MD   1 mg at 01/13/20 1501  . nitroGLYCERIN (NITROSTAT) SL tablet 0.4 mg  0.4 mg Sublingual Q5 min PRN Caroline More, DPM      . ondansetron [facility]) injection 4 mg  4 mg Intravenous Q8H PRN Caroline More, DPM       . senna (SENOKOT) tablet 17.2 mg  2 tablet Oral Daily PRN Caroline More, DPM      . tamsulosin (FLOMAX) capsule 0.4 mg  0.4 mg Oral Daily Caroline More, DPM   0.4 mg at 01/13/20 1138  . torsemide (DEMADEX) tablet 20 mg  20 mg Oral Daily Caroline More, DPM   20 mg at 01/13/20 1138   Past Medical History:  Diagnosis Date  . Allergy   . Atrial fibrillation ([place])   . B12 deficiency   . Chronic kidney disease   . Chronic kidney disease (CKD), stage II (mild)   . Coronary atherosclerosis   . Diabetes mellitus without complication ([place])   . Elevated PSA   . Esophagitis   . GERD (gastroesophageal reflux disease)   . Heart attack ([place])   . Heart disease   . Hyperlipidemia   . Hypertension   . Hypokalemia   . Mild neurocognitive disorder   . Osteoarthritis   . Presbyopia   . PVD (peripheral vascular disease) ([place])   . Restless leg syndrome   . Stroke (cerebrum) ([place])   . Stroke ([place])   . Subacute osteomyelitis ([place])   . Tinea unguium   . Uncompensated short term memory deficit    Past Surgical History:  Procedure Laterality Date  . CARDIAC PACEMAKER PLACEMENT    . COLONOSCOPY WITH PROPOFOL N/A 05/18/2018   Procedure: COLONOSCOPY WITH PROPOFOL;  Surgeon: [name], MD;  Location: [facility] ENDOSCOPY;  Service: Endoscopy;  Laterality: N/A;  . CORONARY ARTERY BYPASS GRAFT    . FOOT AMPUTATION Right   . INTRAMEDULLARY (IM) NAIL INTERTROCHANTERIC Right 11/16/2019   Procedure: INTRAMEDULLARY (IM) NAIL INTERTROCHANTRIC;  Surgeon: Thornton Park, MD;  Location: [facility] ORS;  Service: Orthopedics;  Laterality: Right;  . LOWER EXTREMITY ANGIOGRAPHY Left 10/10/2019   Procedure: Lower Extremity Angiography;  Surgeon: Algernon Huxley, MD;  Location: [facility];  Service: Cardiovascular;  Laterality: Left;   Social History Social History   Tobacco Use  . Smoking status: Never Smoker  . Smokeless tobacco: Never Used  Vaping Use  . Vaping Use: Never used  Substance Use Topics  .  Alcohol use: Never  . Drug use: Never   Family History Family History  Problem Relation Age of Onset  . Heart failure Mother   . Colon cancer Mother   . Diabetes Mother   . Thyroid disease Sister   Unable to collect as the patient has altered mental status  Allergies  Allergen Reactions  . Amlodipine Other (See Comments)  . Norvasc [Amlodipine Besylate]     Unknown  . Penicillins     Childhood allergy, not sure what happens    REVIEW OF SYSTEMS (Negative unless checked)  Constitutional: [] Weight loss  [] Fever  [] Chills Cardiac: [] Chest pain   [] Chest pressure   [] Palpitations   [] Shortness of breath when laying flat   [] Shortness of breath at rest   [] Shortness of breath with exertion. Vascular:  [] Pain in legs with walking   []   Pain in legs at rest   [] Pain in legs when laying flat   [] Claudication   [] Pain in feet when walking  [] Pain in feet at rest  [] Pain in feet when laying flat   [] History of DVT   [] Phlebitis   [] Swelling in legs   [] Varicose veins   [] Non-healing ulcers Pulmonary:   [] Uses home oxygen   [] Productive cough   [] Hemoptysis   [] Wheeze  [] COPD   [] Asthma Neurologic:  [] Dizziness  [] Blackouts   [] Seizures   [] History of stroke   [] History of TIA  [] Aphasia   [] Temporary blindness   [] Dysphagia   [] Weakness or numbness in arms   [] Weakness or numbness in legs Musculoskeletal:  [] Arthritis   [] Joint swelling   [] Joint pain   [] Low back pain Hematologic:  [] Easy bruising  [] Easy bleeding   [] Hypercoagulable state   [] Anemic  [] Hepatitis Gastrointestinal:  [] Blood in stool   [] Vomiting blood  [] Gastroesophageal reflux/heartburn   [] Difficulty swallowing. Genitourinary:  [] Chronic kidney disease   [] Difficult urination  [] Frequent urination  [] Burning with urination   [] Blood in urine Skin:  [] Rashes   [] Ulcers   [] Wounds Psychological:  [] History of anxiety   []  History of major depression.  Unable to ascertain as the patient has altered mental status.  Physical  Examination  Vitals:   01/13/20 0948 01/13/20 1017 01/13/20 1416 01/13/20 1444  BP: 101/73 (!) 106/59 (!) 119/108 (!) 130/59  Pulse: 99 97 (!) 104 (!) 117  Resp: 16 18 20    Temp: 97.6 F (36.4 C) 97.9 F (36.6 C) 97.9 F (36.6 C)   TempSrc: Oral     SpO2: 90% 94% 98% 98%  Weight:       Body mass index is 24.68 kg/m. Gen: Altered mental status.  Unable to properly answer questions.  No acute distress. Head: [place]/AT, No temporalis wasting. Prominent temp pulse not noted. Ear/Nose/Throat: Hearing grossly intact, nares w/o erythema or drainage, oropharynx w/o Erythema/Exudate Eyes: Sclera non-icteric, conjunctiva clear Neck: Trachea midline.  No JVD.  Pulmonary:  Good air movement, respirations not labored, equal bilaterally.  Cardiac: RRR, normal S1, S2. Vascular:  Vessel Right Left  Radial Palpable Palpable  Ulnar Palpable Palpable  Brachial Palpable Palpable  Carotid Palpable, without bruit Palpable, without bruit  Aorta Not palpable N/A  Femoral Palpable Palpable  Popliteal Palpable Palpable  PT Palpable Non-Palpable  DP Palpable Non-Palpable   Left lower extremity: Thigh soft.  Calf soft.  Extremities warm distally to toes.  Podiatry OR dressing intact clean and dry.  Unable to palpate pedal pulses.  Gastrointestinal: soft, non-tender/non-distended. No guarding/reflex.  Musculoskeletal: M/S 5/5 throughout.  Extremities without ischemic changes.  No deformity or atrophy. No edema. Neurologic: Sensation grossly intact in extremities.  Symmetrical.  Speech is fluent. Motor exam as listed above. Psychiatric: Judgment intact, Mood & affect appropriate for pt's clinical situation. Dermatologic: No rashes or ulcers noted.  No cellulitis or open wounds. Lymph : No Cervical, Axillary, or Inguinal lymphadenopathy.  CBC Lab Results  Component Value Date   WBC 6.7 01/12/2020   HGB 11.7 (L) 01/12/2020   HCT 37.2 (L) 01/12/2020   MCV 91.4 01/12/2020   PLT 181 01/12/2020    BMET    Component Value Date/Time   NA 140 01/12/2020 0457   K 4.5 01/12/2020 0457   CL 105 01/12/2020 0457   CO2 23 01/12/2020 0457   GLUCOSE 77 01/12/2020 0457   BUN 31 (H) 01/12/2020 0457   CREATININE 1.32 (H) 01/12/2020 0457  CALCIUM 9.7 01/12/2020 0457   GFRNONAA 55 (L) 01/12/2020 0457   GFRAA >60 01/12/2020 0457   Estimated Creatinine Clearance: 54.3 mL/min (A) (by C-G formula based on SCr of 1.32 mg/dL (H)).  COAG Lab Results  Component Value Date   INR 1.3 (H) 01/11/2020   INR 1.5 (H) 11/16/2019   INR 1.6 (H) 11/15/2019   Radiology CT Head Wo Contrast  Result Date: 01/11/2020 CLINICAL DATA:  [place] EXAM: CT HEAD WITHOUT CONTRAST TECHNIQUE: Contiguous axial images were obtained from the base of the skull through the vertex without intravenous contrast. COMPARISON:  10/11/2019 head CT and prior. FINDINGS: Brain: No acute infarct or intracranial hemorrhage. Sequela of remote left occipital and left cerebellar insults. No mass lesion. No midline shift, ventriculomegaly or extra-axial fluid collection. Mild global cerebral atrophy with ex vacuo dilatation. Scattered and confluent hypodense foci involving the periventricular and subcortical white matter are nonspecific however commonly associated with chronic microvascular ischemic changes. Vascular: No hyperdense vessel. Bilateral skull base atherosclerotic calcifications. Skull: Negative for fracture or focal lesion. Sinuses/Orbits: Normal orbits. Clear paranasal sinuses. No mastoid effusion. Other: None. IMPRESSION: No acute intracranial process. Sequela of remote left occipital and left cerebellar insults. Mild cerebral atrophy and chronic microvascular ischemic changes. Electronically Signed   By: Primitivo Gauze M.D.   On: 01/11/2020 08:02   DG Chest Portable 1 View  Result Date: 01/11/2020 CLINICAL DATA:  Altered mental status EXAM: PORTABLE CHEST 1 VIEW COMPARISON:  11/15/2019 FINDINGS: Left-sided implanted cardiac  device. Post CABG changes. Heart size is normal. Atherosclerotic calcification of the aortic knob. Mild streaky bibasilar opacities. No pleural effusion or pneumothorax. Degenerative changes of the shoulders. IMPRESSION: Mild streaky bibasilar opacities, favor atelectasis. Electronically Signed   By: Davina Poke D.O.   On: 01/11/2020 07:57   DG Foot 2 Views Left  Result Date: 01/13/2020 CLINICAL DATA:  Status post amputation. EXAM: LEFT FOOT - 2 VIEW COMPARISON:  01/11/2020 FINDINGS: Status post amputation of the left great toe. The first metatarsal is intact. No acute bony findings. Extensive vascular calcifications. IMPRESSION: Status post amputation of the left great toe. Electronically Signed   By: Marijo Sanes M.D.   On: 01/13/2020 10:54   DG Toe Great Left  Result Date: 01/11/2020 CLINICAL DATA:  Left great toe pain and swelling EXAM: LEFT GREAT TOE COMPARISON:  10/07/2019 FINDINGS: There is overlying soft tissue wound along the plantar and medial aspects of the left great toe distal phalanx. There is cortical destruction of the medial cortex of the great toe distal phalanx, new from prior, compatible with acute osteomyelitis. No fracture. No dislocation. Similar degenerative changes. Advanced small vessel atherosclerotic calcification. No tracking soft tissue gas. IMPRESSION: 1. Cortical destruction of the medial cortex of the left great toe distal phalanx, new from prior, compatible with acute osteomyelitis. Overlying soft tissue wound noted. No tracking soft tissue gas. 2. Advanced small vessel atherosclerotic calcification. Electronically Signed   By: Davina Poke D.O.   On: 01/11/2020 07:59   DG MINI C-ARM IMAGE ONLY  Result Date: 01/13/2020 There is no interpretation for this exam.  This order is for images obtained during a surgical procedure.  Please See "Surgeries" Tab for more information regarding the procedure.   Assessment/Plan The patient is a 69 year old male with  multiple medical issues who presented to [facility] emergency department status post overdose with history of known atherosclerotic disease and osteomyelitis of the left first toe  1.  Atherosclerotic disease of left lower extremity:  Patient known to our service, status post left lower extremity angiogram with intervention on Oct 10, 2019.  Patient was scheduled to undergo amputation of the left first toe this week, however was brought to the emergency room status post overdose.  Today, patient underwent amputation and his podiatrist Dr. Luana Shu noted minimal intraoperative bleeding.  In the setting of known atherosclerotic disease, nonpalpable pedal pulses and minimal intraoperative bleeding recommend the patient undergo a left lower extremity angiogram with intervention and attempt to revascularize the leg.  At this time, the patient is unable to consent to this procedure due to his altered mental status.  Possibly plan on angiogram this coming Monday.  2.  Hyperlipidemia: On aspirin and statin Eliquis for medical management Unable to ascertain if patient has been compliant  3.  Type 2 diabetes: On appropriate medications Unable to ascertain if patient has been compliant  Charlo, PA-C  01/13/2020 4:00 PM  This note was created with Dragon medical transcription system.  Any error is purely unintentional.

## 2020-01-13 NOTE — Progress Notes (Signed)
   01/13/20 1416  Assess: MEWS Score  Temp 97.9 F (36.6 C)  BP (!) 119/108  Pulse Rate (!) 104  Resp 20  SpO2 98 %  O2 Device Room Air  Assess: MEWS Score  MEWS Temp 0  MEWS Systolic 0  MEWS Pulse 1  MEWS RR 0  MEWS LOC 1  MEWS Score 2  MEWS Score Color Yellow  Assess: if the MEWS score is Yellow or Red  Were vital signs taken at a resting state? Yes  Focused Assessment Change from prior assessment (see assessment flowsheet)  Early Detection of Sepsis Score *See Row Information* Low  MEWS guidelines implemented *See Row Information* Yes  Take Vital Signs  Increase Vital Sign Frequency  Yellow: Q 2hr X 2 then Q 4hr X 2, if remains yellow, continue Q 4hrs  Escalate  MEWS: Escalate Yellow: discuss with charge nurse/RN and consider discussing with provider and RRT  Notify: Charge Nurse/RN  Name of Charge Nurse/RN Notified Olivia  Date Charge Nurse/RN Notified 01/13/20  Time Charge Nurse/RN Notified 1420  Document  Patient Outcome Stabilized after interventions  Progress note created (see row info) Yes

## 2020-01-13 NOTE — Consult Note (Signed)
Rama Anne Fu MD Psych Consult Brief Entry  Came by but patient is sedated  Will have to reassess later or in am  Received IM ativan prior remains with sitter Post OD and needs much collateral info and all from family to address his baseline and previous level of functioning and all.   Janese Banks MD

## 2020-01-13 NOTE — Op Note (Signed)
PODIATRY / FOOT AND ANKLE SURGERY OPERATIVE REPORT    SURGEON: Caroline More, DPM  PRE-OPERATIVE DIAGNOSIS: Left hallux osteomyelitis  POST-OPERATIVE DIAGNOSIS: Same  PROCEDURE(S): 1. Left hallux amputation  HEMOSTASIS: No tourniquet  ANESTHESIA: MAC  ESTIMATED BLOOD LOSS: 20 cc  FINDING(S): 1.  Left hallux osteomyelitis with bone exposed at IPJ ulceration  PATHOLOGY/SPECIMEN(S): Left hallux with proximal margin marked in purple ink  INDICATIONS:   Jason Montes is a 69 y.o. male who presents with a nonhealing ulceration to the left hallux IPJ.  Patient had gangrenous changes to this area but has been treated with local wound care.  Patient is done better but x-rays have shown changes consistent with osteomyelitis to the left hallux.  Patient recently had revascularization performed in the past 3 months and has been optimized for procedure.  Patient was scheduled to have the procedure today but was admitted to the hospital due to overdosing on prescription pain medication.  Discussed procedure with POA in detail and consent has been placed and signed and's put in chart.  Patient is to proceed with left hallux amputation..  DESCRIPTION: After obtaining full informed written consent, the patient was brought back to the operating room and placed supine upon the operating table.  The patient received IV antibiotics prior to induction.  After obtaining adequate anesthesia, a left hallux block was performed with 10 cc of 1% lidocaine plain.  The patient was prepped and draped in the standard fashion.  No tourniquet was applied during the procedure.  Attention was then directed to the left hallux where a racquet type incision was made slightly distal to the metatarsal phalangeal joint keeping a larger plantar flap and dorsal flap extending to the medial first metatarsal phalangeal joint area.  The incision was made straight to bone.  At this time an extensor tenotomy and capsulotomy was  performed followed by release the collateral and suspensory ligaments as well as the flexor tendon and any attachment of the plantar plate.  The hallux was disarticulated and passed off in the operative site.  The proximal margin was marked in purple ink and the specimen sent off to pathology.  A flush was performed with copious amounts normal sterile saline.  The skin flaps were then reapproximated well coapted with 3-0 nylon in a combination of simple and horizontal mattress type stitching.  A postoperative dressing was then applied consisting of Xeroform followed by 4 x 4 gauze, Kerlix, Ace wrap.  The patient tolerated the procedure and anesthesia well was transferred to recovery room vital signs stable vascular status remaining intact to the left foot.  Patient had minimal bleeding during the case despite revascularization procedure.  Could have a difficult time healing the procedure site due to lack of blood flow present.  Message sent to vascular surgery to further evaluate patient.  Capillary fill time appears to be intact to the flaps low currently.  May not be anything to address but will allow vascular surgery to make that decision.  We may have to treat this with local wound care if it does not heal.  COMPLICATIONS: None  CONDITION: Stable  Caroline More, DPM

## 2020-01-13 NOTE — Anesthesia Postprocedure Evaluation (Deleted)
Anesthesia Post Note  Patient: Jason Montes  Procedure(s) Performed: AMPUTATION RAY LEFT 1ST (Left Toe)  Patient location during evaluation: PACU Anesthesia Type: General Post-procedure mental status: baseline confusion  Pain management: pain level controlled Vital Signs Assessment: post-procedure vital signs reviewed and stable Respiratory status: spontaneous breathing, nonlabored ventilation, respiratory function stable and patient connected to nasal cannula oxygen Cardiovascular status: blood pressure returned to baseline and stable Postop Assessment: no apparent nausea or vomiting Anesthetic complications: no   No complications documented.   Last Vitals:  Vitals:   01/13/20 0828 01/13/20 0830  BP: (!) 91/58 (!) 76/66  Pulse: 99 99  Resp: 19 (!) 30  Temp:    SpO2: (!) 48% 92%    Last Pain:  Vitals:   01/12/20 2100  TempSrc:   PainSc: 0-No pain                 Precious Haws Delma Drone

## 2020-01-13 NOTE — H&P (Signed)
HISTORY AND PHYSICAL INTERVAL NOTE:  01/13/2020  7:21 AM  Jason Montes  has presented today for surgery, with the diagnosis of L97.524  CHRONIC TOE ULCER LEFT, osteomyelitis left hallux I96  GANGRENE LEFT TOE E11.42  DIABETES.  The various methods of treatment have been discussed with the patient.  No guarantees were given.  After consideration of risks, benefits and other options for treatment, the patient has consented to surgery.  I have reviewed the patients' chart and labs.    PROCEDURE: LEFT HALLUX AMPUTATION  The patient was reexamined.  There have been no changes to this history and physical examination.  Dr. Vickki Muff discussed case with POA and consent obtained.  Caroline More, DPM

## 2020-01-13 NOTE — Anesthesia Postprocedure Evaluation (Signed)
Anesthesia Post Note  Patient: Jason Montes  Procedure(s) Performed: AMPUTATION RAY LEFT 1ST (Left Toe)  Patient location during evaluation: PACU Anesthesia Type: General Post-procedure mental status: Baseline confusion. Pain management: pain level controlled Vital Signs Assessment: post-procedure vital signs reviewed and stable Respiratory status: spontaneous breathing, nonlabored ventilation, respiratory function stable and patient connected to nasal cannula oxygen Cardiovascular status: blood pressure returned to baseline and stable Postop Assessment: no apparent nausea or vomiting Anesthetic complications: no   No complications documented.   Last Vitals:  Vitals:   01/13/20 0928 01/13/20 0948  BP: 105/64 101/73  Pulse: (!) 112 99  Resp: (!) 24 16  Temp:  36.4 C  SpO2: 97% 90%    Last Pain:  Vitals:   01/13/20 0948  TempSrc: Oral  PainSc:                  Precious Haws Tola Meas

## 2020-01-13 NOTE — Consult Note (Signed)
[facility] Consult Note  MRN : [id number]  Jason Montes is a 69 y.o. (1951-03-17) male who presents with chief complaint of  Chief Complaint  Patient presents with  . Altered Mental Status   History of Present Illness: Jason Montes is a 69 year old male with medical history significant of hypertension, hyperlipidemia, diabetes mellitus, stroke, GERD, depression, RLS, PVD, CAD, CKD-3, atrial fibrillation on Eliquis, chronic left greater toe osteomyelitis, sCHF with EF of 40-45%, who presents with altered mental status and overdose.  Per his daughter, patient overdosed tramadol, flexeril and gabapentin.It is unclear how much pt actually took.  His daughter states that this is most likely due to suicidal attempt. Pt is confused with slurring speech. He moves all extremities.  No facial droop. Patient does not have active cough, respiratory distress, nausea, vomiting, diarrhea.  Patient does not seem to have chest pain or abdominal pain.  Not sure if patient has symptoms of UTI.   The patient has chronic left great toe osteomyelitis. Today, the patient underwent left hallux osteomyelitis with bone exposed at IPJ ulceration.  During the procedure, Dr. Luana Shu noted minimal intraoperative bleeding.  On 10/10/19, the patient underwent: 1. Ultrasound guidance for vascular access right femoral artery 2. Catheter placement into left common femoral artery from right femoral approach 3. Aortogram and selective left lower extremity angiogram 4. Percutaneous transluminal angioplasty of left anterior tibial artery with 3 mm diameter angioplasty balloon 5.  Percutaneous transluminal angioplasty of left peroneal artery with 2.5 mm diameter angioplasty balloon             6.  Percutaneous transluminal angioplasty of left popliteal artery with 5 mm diameter Lutonix drug-coated angioplasty  balloon 7. StarClose closure device right femoral artery  Vascular surgery was consulted by Dr. Luana Shu for possible endovascular intervention  Current Facility-Administered Medications  Medication Dose Route Frequency Provider Last Rate Last Admin  . acetaminophen (TYLENOL) tablet 650 mg  650 mg Oral Q6H PRN Caroline More, DPM   650 mg at 01/12/20 1406   Or  . acetaminophen (TYLENOL) suppository 650 mg  650 mg Rectal Q6H PRN Caroline More, DPM      . aspirin EC tablet 81 mg  81 mg Oral Daily Caroline More, DPM   81 mg at 01/13/20 1139  . atorvastatin (LIPITOR) tablet 40 mg  40 mg Oral QHS Caroline More, DPM   40 mg at 01/12/20 2214  . carvedilol (COREG) tablet 3.125 mg  3.125 mg Oral BID WC Caroline More, DPM   3.125 mg at 01/13/20 1139  . cholecalciferol (VITAMIN D3) tablet 1,000 Units  1,000 Units Oral Daily Caroline More, DPM   1,000 Units at 01/13/20 1138  . enoxaparin (LOVENOX) injection 40 mg  40 mg Subcutaneous Q24H Fritzi Mandes, MD      . famotidine (PEPCID) tablet 20 mg  20 mg Oral Daily Caroline More, DPM   20 mg at 01/13/20 1139  . fluticasone (FLONASE) 50 MCG/ACT nasal spray 2 spray  2 spray Each Nare Daily Caroline More, DPM   2 spray at 01/13/20 1139  . hydrALAZINE (APRESOLINE) injection 5 mg  5 mg Intravenous Q2H PRN Caroline More, DPM      . insulin aspart (novoLOG) injection 0-5 Units  0-5 Units Subcutaneous QHS Caroline More, DPM   1 Units at 01/12/20 2214  . insulin aspart (novoLOG) injection 0-9 Units  0-9 Units Subcutaneous TID WC Caroline More, DPM   6 Units at 01/13/20 1137  .  lisinopril (ZESTRIL) tablet 2.5 mg  2.5 mg Oral Daily Caroline More, DPM   2.5 mg at 01/13/20 1138  . LORazepam (ATIVAN) injection 1 mg  1 mg Intravenous Q6H PRN Fritzi Mandes, MD   1 mg at 01/13/20 1501  . nitroGLYCERIN (NITROSTAT) SL tablet 0.4 mg  0.4 mg Sublingual Q5 min PRN Caroline More, DPM      . ondansetron [facility]) injection 4 mg  4 mg Intravenous Q8H PRN Caroline More, DPM       . senna (SENOKOT) tablet 17.2 mg  2 tablet Oral Daily PRN Caroline More, DPM      . tamsulosin (FLOMAX) capsule 0.4 mg  0.4 mg Oral Daily Caroline More, DPM   0.4 mg at 01/13/20 1138  . torsemide (DEMADEX) tablet 20 mg  20 mg Oral Daily Caroline More, DPM   20 mg at 01/13/20 1138   Past Medical History:  Diagnosis Date  . Allergy   . Atrial fibrillation ([place])   . B12 deficiency   . Chronic kidney disease   . Chronic kidney disease (CKD), stage II (mild)   . Coronary atherosclerosis   . Diabetes mellitus without complication ([place])   . Elevated PSA   . Esophagitis   . GERD (gastroesophageal reflux disease)   . Heart attack ([place])   . Heart disease   . Hyperlipidemia   . Hypertension   . Hypokalemia   . Mild neurocognitive disorder   . Osteoarthritis   . Presbyopia   . PVD (peripheral vascular disease) ([place])   . Restless leg syndrome   . Stroke (cerebrum) ([place])   . Stroke ([place])   . Subacute osteomyelitis ([place])   . Tinea unguium   . Uncompensated short term memory deficit    Past Surgical History:  Procedure Laterality Date  . CARDIAC PACEMAKER PLACEMENT    . COLONOSCOPY WITH PROPOFOL N/A 05/18/2018   Procedure: COLONOSCOPY WITH PROPOFOL;  Surgeon: [name], MD;  [facility];  Service: Endoscopy;  Laterality: N/A;  . CORONARY ARTERY BYPASS GRAFT    . FOOT AMPUTATION Right   . INTRAMEDULLARY (IM) NAIL INTERTROCHANTERIC Right 11/16/2019   Procedure: INTRAMEDULLARY (IM) NAIL INTERTROCHANTRIC;  Surgeon: Thornton Park, MD;  Location: [facility] ORS;  Service: Orthopedics;  Laterality: Right;  . LOWER EXTREMITY ANGIOGRAPHY Left 10/10/2019   Procedure: Lower Extremity Angiography;  Surgeon: Algernon Huxley, MD;  Location: [place] CV LAB;  Service: Cardiovascular;  Laterality: Left;   Social History Social History   Tobacco Use  . Smoking status: Never Smoker  . Smokeless tobacco: Never Used  Vaping Use  . Vaping Use: Never used  Substance Use Topics  .  Alcohol use: Never  . Drug use: Never   Family History Family History  Problem Relation Age of Onset  . Heart failure Mother   . Colon cancer Mother   . Diabetes Mother   . Thyroid disease Sister   Unable to collect as the patient has altered mental status  Allergies  Allergen Reactions  . Amlodipine Other (See Comments)  . Norvasc [Amlodipine Besylate]     Unknown  . Penicillins     Childhood allergy, not sure what happens    REVIEW OF SYSTEMS (Negative unless checked)  Constitutional: [] Weight loss  [] Fever  [] Chills Cardiac: [] Chest pain   [] Chest pressure   [] Palpitations   [] Shortness of breath when laying flat   [] Shortness of breath at rest   [] Shortness of breath with exertion. Vascular:  [] Pain in legs with walking   []   Pain in legs at rest   [] Pain in legs when laying flat   [] Claudication   [] Pain in feet when walking  [] Pain in feet at rest  [] Pain in feet when laying flat   [] History of DVT   [] Phlebitis   [] Swelling in legs   [] Varicose veins   [] Non-healing ulcers Pulmonary:   [] Uses home oxygen   [] Productive cough   [] Hemoptysis   [] Wheeze  [] COPD   [] Asthma Neurologic:  [] Dizziness  [] Blackouts   [] Seizures   [] History of stroke   [] History of TIA  [] Aphasia   [] Temporary blindness   [] Dysphagia   [] Weakness or numbness in arms   [] Weakness or numbness in legs Musculoskeletal:  [] Arthritis   [] Joint swelling   [] Joint pain   [] Low back pain Hematologic:  [] Easy bruising  [] Easy bleeding   [] Hypercoagulable state   [] Anemic  [] Hepatitis Gastrointestinal:  [] Blood in stool   [] Vomiting blood  [] Gastroesophageal reflux/heartburn   [] Difficulty swallowing. Genitourinary:  [] Chronic kidney disease   [] Difficult urination  [] Frequent urination  [] Burning with urination   [] Blood in urine Skin:  [] Rashes   [] Ulcers   [] Wounds Psychological:  [] History of anxiety   []  History of major depression.  Unable to ascertain as the patient has altered mental status.  Physical  Examination  Vitals:   01/13/20 0948 01/13/20 1017 01/13/20 1416 01/13/20 1444  BP: 101/73 (!) 106/59 (!) 119/108 (!) 130/59  Pulse: 99 97 (!) 104 (!) 117  Resp: 16 18 20    Temp: 97.6 F (36.4 C) 97.9 F (36.6 C) 97.9 F (36.6 C)   TempSrc: Oral     SpO2: 90% 94% 98% 98%  Weight:       Body mass index is 24.68 kg/m. Gen: Altered mental status.  Unable to properly answer questions.  No acute distress. Head: [place]/AT, No temporalis wasting. Prominent temp pulse not noted. Ear/Nose/Throat: Hearing grossly intact, nares w/o erythema or drainage, oropharynx w/o Erythema/Exudate Eyes: Sclera non-icteric, conjunctiva clear Neck: Trachea midline.  No JVD.  Pulmonary:  Good air movement, respirations not labored, equal bilaterally.  Cardiac: RRR, normal S1, S2. Vascular:  Vessel Right Left  Radial Palpable Palpable  Ulnar Palpable Palpable  Brachial Palpable Palpable  Carotid Palpable, without bruit Palpable, without bruit  Aorta Not palpable N/A  Femoral Palpable Palpable  Popliteal Palpable Palpable  PT Palpable Non-Palpable  DP Palpable Non-Palpable   Left lower extremity: Thigh soft.  Calf soft.  Extremities warm distally to toes.  Podiatry OR dressing intact clean and dry.  Unable to palpate pedal pulses.  Gastrointestinal: soft, non-tender/non-distended. No guarding/reflex.  Musculoskeletal: M/S 5/5 throughout.  Extremities without ischemic changes.  No deformity or atrophy. No edema. Neurologic: Sensation grossly intact in extremities.  Symmetrical.  Speech is fluent. Motor exam as listed above. Psychiatric: Judgment intact, Mood & affect appropriate for pt's clinical situation. Dermatologic: No rashes or ulcers noted.  No cellulitis or open wounds. Lymph : No Cervical, Axillary, or Inguinal lymphadenopathy.  CBC Lab Results  Component Value Date   WBC 6.7 01/12/2020   HGB 11.7 (L) 01/12/2020   HCT 37.2 (L) 01/12/2020   MCV 91.4 01/12/2020   PLT 181 01/12/2020    BMET    Component Value Date/Time   NA 140 01/12/2020 0457   K 4.5 01/12/2020 0457   CL 105 01/12/2020 0457   CO2 23 01/12/2020 0457   GLUCOSE 77 01/12/2020 0457   BUN 31 (H) 01/12/2020 0457   CREATININE 1.32 (H) 01/12/2020 0457  CALCIUM 9.7 01/12/2020 0457   GFRNONAA 55 (L) 01/12/2020 0457   GFRAA >60 01/12/2020 0457   Estimated Creatinine Clearance: 54.3 mL/min (A) (by C-G formula based on SCr of 1.32 mg/dL (H)).  COAG Lab Results  Component Value Date   INR 1.3 (H) 01/11/2020   INR 1.5 (H) 11/16/2019   INR 1.6 (H) 11/15/2019   Radiology CT Head Wo Contrast  Result Date: 01/11/2020 CLINICAL DATA:  [place] EXAM: CT HEAD WITHOUT CONTRAST TECHNIQUE: Contiguous axial images were obtained from the base of the skull through the vertex without intravenous contrast. COMPARISON:  10/11/2019 head CT and prior. FINDINGS: Brain: No acute infarct or intracranial hemorrhage. Sequela of remote left occipital and left cerebellar insults. No mass lesion. No midline shift, ventriculomegaly or extra-axial fluid collection. Mild global cerebral atrophy with ex vacuo dilatation. Scattered and confluent hypodense foci involving the periventricular and subcortical white matter are nonspecific however commonly associated with chronic microvascular ischemic changes. Vascular: No hyperdense vessel. Bilateral skull base atherosclerotic calcifications. Skull: Negative for fracture or focal lesion. Sinuses/Orbits: Normal orbits. Clear paranasal sinuses. No mastoid effusion. Other: None. IMPRESSION: No acute intracranial process. Sequela of remote left occipital and left cerebellar insults. Mild cerebral atrophy and chronic microvascular ischemic changes. Electronically Signed   By: Primitivo Gauze M.D.   On: 01/11/2020 08:02   DG Chest Portable 1 View  Result Date: 01/11/2020 CLINICAL DATA:  Altered mental status EXAM: PORTABLE CHEST 1 VIEW COMPARISON:  11/15/2019 FINDINGS: Left-sided implanted cardiac  device. Post CABG changes. Heart size is normal. Atherosclerotic calcification of the aortic knob. Mild streaky bibasilar opacities. No pleural effusion or pneumothorax. Degenerative changes of the shoulders. IMPRESSION: Mild streaky bibasilar opacities, favor atelectasis. Electronically Signed   By: Davina Poke D.O.   On: 01/11/2020 07:57   DG Foot 2 Views Left  Result Date: 01/13/2020 CLINICAL DATA:  Status post amputation. EXAM: LEFT FOOT - 2 VIEW COMPARISON:  01/11/2020 FINDINGS: Status post amputation of the left great toe. The first metatarsal is intact. No acute bony findings. Extensive vascular calcifications. IMPRESSION: Status post amputation of the left great toe. Electronically Signed   By: Marijo Sanes M.D.   On: 01/13/2020 10:54   DG Toe Great Left  Result Date: 01/11/2020 CLINICAL DATA:  Left great toe pain and swelling EXAM: LEFT GREAT TOE COMPARISON:  10/07/2019 FINDINGS: There is overlying soft tissue wound along the plantar and medial aspects of the left great toe distal phalanx. There is cortical destruction of the medial cortex of the great toe distal phalanx, new from prior, compatible with acute osteomyelitis. No fracture. No dislocation. Similar degenerative changes. Advanced small vessel atherosclerotic calcification. No tracking soft tissue gas. IMPRESSION: 1. Cortical destruction of the medial cortex of the left great toe distal phalanx, new from prior, compatible with acute osteomyelitis. Overlying soft tissue wound noted. No tracking soft tissue gas. 2. Advanced small vessel atherosclerotic calcification. Electronically Signed   By: Davina Poke D.O.   On: 01/11/2020 07:59   DG MINI C-ARM IMAGE ONLY  Result Date: 01/13/2020 There is no interpretation for this exam.  This order is for images obtained during a surgical procedure.  Please See "Surgeries" Tab for more information regarding the procedure.   Assessment/Plan The patient is a 69 year old male with  multiple medical issues who presented to [facility] emergency department status post overdose with history of known atherosclerotic disease and osteomyelitis of the left first toe  1.  Atherosclerotic disease of left lower extremity:  Patient known to our service, status post left lower extremity angiogram with intervention on Oct 10, 2019.  Patient was scheduled to undergo amputation of the left first toe this week, however was brought to the emergency room status post overdose.  Today, patient underwent amputation and his podiatrist Dr. Luana Shu noted minimal intraoperative bleeding.  In the setting of known atherosclerotic disease, nonpalpable pedal pulses and minimal intraoperative bleeding recommend the patient undergo a left lower extremity angiogram with intervention and attempt to revascularize the leg.  At this time, the patient is unable to consent to this procedure due to his altered mental status.  Possibly plan on angiogram this coming Monday.  2.  Hyperlipidemia: On aspirin and statin Eliquis for medical management Unable to ascertain if patient has been compliant  3.  Type 2 diabetes: On appropriate medications Unable to ascertain if patient has been compliant  [place], PA-C  01/13/2020 4:00 PM  This note was created with Dragon medical transcription system.  Any error is purely unintentional.

## 2020-01-14 ENCOUNTER — Inpatient Hospital Stay: Payer: Medicare Other

## 2020-01-14 ENCOUNTER — Encounter: Payer: Self-pay | Admitting: Podiatry

## 2020-01-14 DIAGNOSIS — I480 Paroxysmal atrial fibrillation: Secondary | ICD-10-CM

## 2020-01-14 DIAGNOSIS — N178 Other acute kidney failure: Secondary | ICD-10-CM

## 2020-01-14 DIAGNOSIS — M869 Osteomyelitis, unspecified: Secondary | ICD-10-CM

## 2020-01-14 LAB — GLUCOSE, CAPILLARY
Glucose-Capillary: 237 mg/dL — ABNORMAL HIGH (ref 70–99)
Glucose-Capillary: 386 mg/dL — ABNORMAL HIGH (ref 70–99)
Glucose-Capillary: 349 mg/dL — ABNORMAL HIGH (ref 70–99)
Glucose-Capillary: 273 mg/dL — ABNORMAL HIGH (ref 70–99)

## 2020-01-14 LAB — URINALYSIS, COMPLETE (UACMP) WITH MICROSCOPIC
Bacteria, UA: NONE SEEN
Bilirubin Urine: NEGATIVE
Glucose, UA: >=500 mg/dL — AB
Hgb urine dipstick: NEGATIVE
Ketones, ur: 5 mg/dL — AB
Leukocytes,Ua: NEGATIVE
Nitrite: NEGATIVE
Protein, ur: NEGATIVE mg/dL
Specific Gravity, Urine: 1.011 (ref 1.005–1.030)
pH: 5 (ref 5.0–8.0)

## 2020-01-14 LAB — BASIC METABOLIC PANEL
Anion gap: 18 — ABNORMAL HIGH (ref 5–15)
BUN: 68 mg/dL — ABNORMAL HIGH (ref 8–23)
CO2: 16 mmol/L — ABNORMAL LOW (ref 22–32)
Calcium: 9.3 mg/dL (ref 8.9–10.3)
Chloride: 107 mmol/L (ref 98–111)
Creatinine, Ser: 2.44 mg/dL — ABNORMAL HIGH (ref 0.61–1.24)
GFR calc Af Amer: 31 mL/min — ABNORMAL LOW (ref >60)
GFR calc non Af Amer: 26 mL/min — ABNORMAL LOW (ref >60)
Glucose, Bld: 328 mg/dL — ABNORMAL HIGH (ref 70–99)
Potassium: 4.5 mmol/L (ref 3.5–5.1)
Sodium: 141 mmol/L (ref 135–145)

## 2020-01-14 LAB — MAGNESIUM: Magnesium: 2.5 mg/dL — ABNORMAL HIGH (ref 1.7–2.4)

## 2020-01-14 LAB — AMMONIA: Ammonia: 14 umol/L (ref 9–35)

## 2020-01-14 LAB — PROCALCITONIN: Procalcitonin: 4.04 ng/mL

## 2020-01-14 MED ORDER — DIPHENHYDRAMINE HCL 25 MG PO CAPS
50.0000 mg | ORAL_CAPSULE | Freq: Every day | ORAL | Status: DC
  Administered 2020-01-15 – 2020-01-22 (×8): 50 mg via ORAL
  Filled 2020-01-14 (×9): qty 2

## 2020-01-14 MED ORDER — LACTATED RINGERS IV SOLN: INTRAVENOUS | Status: DC

## 2020-01-14 MED ORDER — OLANZAPINE 10 MG PO TBDP
10.0000 mg | ORAL_TABLET | Freq: Every day | ORAL | Status: DC
  Administered 2020-01-15 – 2020-01-22 (×8): 10 mg via ORAL
  Filled 2020-01-14 (×10): qty 1

## 2020-01-14 MED ORDER — SODIUM CHLORIDE 0.9 % IV SOLN: 500.0000 mg | Freq: Three times a day (TID) | INTRAVENOUS | Status: DC

## 2020-01-14 MED ORDER — SODIUM CHLORIDE 0.9 % IV SOLN
INTRAVENOUS | Status: DC | PRN
  Administered 2020-01-14: 250 mL via INTRAVENOUS
  Administered 2020-01-27: 500 mL via INTRAVENOUS

## 2020-01-14 MED ORDER — HALOPERIDOL 0.5 MG PO TABS
0.5000 mg | ORAL_TABLET | Freq: Two times a day (BID) | ORAL | Status: DC
  Administered 2020-01-14 – 2020-01-23 (×18): 0.5 mg via ORAL
  Filled 2020-01-14 (×20): qty 1

## 2020-01-14 MED ORDER — SENNOSIDES-DOCUSATE SODIUM 8.6-50 MG PO TABS
2.0000 | ORAL_TABLET | Freq: Two times a day (BID) | ORAL | Status: DC
  Administered 2020-01-14 – 2020-01-23 (×18): 2 via ORAL
  Filled 2020-01-14 (×19): qty 2

## 2020-01-14 MED ORDER — GABAPENTIN 100 MG PO CAPS
200.0000 mg | ORAL_CAPSULE | Freq: Two times a day (BID) | ORAL | Status: DC
  Administered 2020-01-14 – 2020-01-23 (×18): 200 mg via ORAL
  Filled 2020-01-14 (×18): qty 2

## 2020-01-14 MED ORDER — CHLORHEXIDINE GLUCONATE CLOTH 2 % EX PADS
6.0000 | MEDICATED_PAD | Freq: Every day | CUTANEOUS | Status: DC
  Administered 2020-01-14 – 2020-01-20 (×7): 6 via TOPICAL

## 2020-01-14 MED ORDER — SODIUM CHLORIDE 0.9 % IV SOLN
1.0000 g | Freq: Two times a day (BID) | INTRAVENOUS | Status: DC
  Administered 2020-01-14 – 2020-01-17 (×5): 1 g via INTRAVENOUS
  Filled 2020-01-14 (×7): qty 1

## 2020-01-14 MED ORDER — SODIUM CHLORIDE 0.9 % IV SOLN
INTRAVENOUS | Status: DC | PRN
  Administered 2020-01-14: 250 mL via INTRAVENOUS

## 2020-01-14 MED ORDER — VANCOMYCIN HCL 750 MG/150ML IV SOLN
750.0000 mg | INTRAVENOUS | Status: DC
  Administered 2020-01-15 – 2020-01-16 (×2): 750 mg via INTRAVENOUS
  Filled 2020-01-14 (×3): qty 150

## 2020-01-14 MED ORDER — ENOXAPARIN SODIUM 30 MG/0.3ML ~~LOC~~ SOLN
30.0000 mg | SUBCUTANEOUS | Status: DC
  Administered 2020-01-14 – 2020-01-16 (×3): 30 mg via SUBCUTANEOUS
  Filled 2020-01-14 (×3): qty 0.3

## 2020-01-14 MED ORDER — VANCOMYCIN HCL 1500 MG/300ML IV SOLN
1500.0000 mg | Freq: Once | INTRAVENOUS | Status: AC
  Administered 2020-01-14: 1500 mg via INTRAVENOUS
  Filled 2020-01-14: qty 300

## 2020-01-14 NOTE — Progress Notes (Signed)
PHARMACY NOTE:  ANTIMICROBIAL RENAL DOSAGE ADJUSTMENT  Current antimicrobial regimen includes a mismatch between antimicrobial dosage and estimated renal function.  As per policy approved by the Pharmacy & Therapeutics and Medical Executive Committees, the antimicrobial dosage will be adjusted accordingly.  Current antimicrobial dosage:  Meropenem 500mg  IV q8h  Indication: osteomyelitis  Renal Function:  Estimated Creatinine Clearance: 29.4 mL/min (A) (by C-G formula based on SCr of 2.44 mg/dL (H)). []      On intermittent HD, scheduled: []      On CRRT    Antimicrobial dosage has been changed to:  Meropenem 1g IV q12h  Additional comments:   Thank you for allowing pharmacy to be a part of this patient's care.  Paulina Fusi, PharmD, BCPS 01/14/2020 2:39 PM
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
## 2020-01-14 NOTE — Consult Note (Signed)
Pharmacy Antibiotic Note  Jason Montes is a 69 y.o. male admitted on 01/11/2020 with osteomyelitis of great left toe s/p amputation.  Patient with PCT of 4.04. Pharmacy has been consulted for Vancomycin dosing. Also receiving Meropenem.  Plan: Loading dose of Vancomycin 1500mg  IV x 1 dose  Will order maintenance dose of Vancomycin 750mg  Q24 hours per [place] [facility]. Will adjust dose pending renal function improvement.  Weight: 75.5 kg (166 lb 7.2 oz)  Temp (24hrs), Avg:98.5 F (36.9 C), Min:97.8 F (36.6 C), Max:98.8 F (37.1 C)  Recent Labs  Lab 01/11/20 0656 01/11/20 0739 01/11/20 1235 01/12/20 0457 01/14/20 0945  WBC 6.7  --   --  6.7  --   CREATININE 1.59*  --   --  1.32* 2.44*  LATICACIDVEN  --  1.2 1.0  --   --     Estimated Creatinine Clearance: 29.4 mL/min (A) (by C-G formula based on SCr of 2.44 mg/dL (H)).    Allergies  Allergen Reactions  . Amlodipine Other (See Comments)  . Norvasc [Amlodipine Besylate]     Unknown  . Penicillins     Childhood allergy, not sure what happens     Antimicrobials this admission: Vancomycin 8/14 >>  Meropenem 8/14 >>   Microbiology results: 8/11 BCx: NGTD  Thank you for allowing pharmacy to be a part of this patient's care.  Tanaka Gillen A Kenise Barraco 01/14/2020 3:09 PM

## 2020-01-14 NOTE — Progress Notes (Addendum)
PROGRESS NOTE    Jason Montes  WFU:[id number] DOB: April 11, 1951 DOA: 01/11/2020 PCP: [facility] (  Chief complaint.  Confusion. Brief Narrative: Jason Montes a 69 y.o.malewith medical history significant ofhypertension, hyperlipidemia, diabetes mellitus, stroke, GERD, depression, RLS, PVD, CAD, CKD-3, atrial fibrillation on Eliquis, chronic left greater toe osteomyelitis,sCHF with EF of 40-45%, who presents with altered mental status and overdose ?unintentional.  8/14.  Patient still has significant confusion today.  Worsening renal function, start IV fluids.  Restart lower dose Neurontin.  Check ammonia level pending.   Assessment & Plan:   Principal Problem:   Acute metabolic encephalopathy Active Problems:   Atrial fibrillation (HCC)   CAD (coronary artery disease)   GERD with esophagitis   Chronic systolic CHF (congestive heart failure) (HCC)   Hyperlipidemia   Essential hypertension   Stage 3a chronic kidney disease   Osteomyelitis of great toe of left foot (HCC)   Type II diabetes mellitus with renal manifestations ([place])   Stroke ([place])   Depression   Suicidal behavior  #1.  Acute metabolic encephalopathy. Reviewed patient CT scan head, did not show any acute changes. UA was negative for UTI.  No respiratory symptoms to suggest pneumonia. Most of his home medicines are on hold.  I will restart Neurontin at the lower dose. Check ammonia level to rule out hepatic encephalopathy. Patient also had significant renal failure today, started on IV fluids. Patient is too sleepy to be evaluated by psychiatry at this point.  2.  Osteomyelitis of the great toe on the left. Status post amputation. Patient does not have sepsis.  Antibiotics was on hold. Due to persistent altered mental status, I will check a stat procalcitonin level to decide if treatment is needed.  3.  Paroxysmal atrial fibrillation. Patient currently in sinus.  Not a chronic  taking anticoagulation.  4.  Chronic systolic congestive heart failure with ejection fraction 40 to 45%. Patient currently does not have any volume overload.  He has worsening renal function, I will start gentle rehydration.  5.  Type 2 diabetes with chronic kidney disease stage IIIa. Continue current treatment.  6.  Acute kidney injury on chronic kidney disease stage IIIa. Patient has worsening renal function today.  Start IV fluids.  Also obtain bladder scan.  7.  Constipation.  Scheduled senna.  Obtain KUB, patient has some mild tenderness in the mid abdomen.  1158. Foley catheter anchored due to severe urinary retention with a residual over 1036m.  UA resent.   1427.  Procalcitonin level elevated 4.04.  Will start antibiotic coverage with vancomycin and meropenem for osteomyelitis.   DVT prophylaxis: Lovenox Code Status: Full Family Communication:None Disposition Plan:  . Patient came from: Home            . Anticipated d/c place: . Barriers to d/c OR conditions which need to be met to effect a safe d/c:   Consultants:   Podiatry. Psychiatry   Procedures:   Left hallux amputation  Antimicrobials:None  Subjective: Patient is very confused, no agitation. Spoke with the nurse, patient does not have any short of breath overnight or this morning. He does not have a cough. He is very constipated, he had 2 stools, small, and very hard.   Objective: Vitals:   01/13/20 2146 01/14/20 0007 01/14/20 0449 01/14/20 0804  BP: (!) 100/59 (!) 105/55  122/69  Pulse: (!) 110 (!) 106  99  Resp: '17 17  16  '$ Temp:    97.8 F (36.6  C)  TempSrc:    Oral  SpO2: 95% 93%  95%  Weight:   75.5 kg     Intake/Output Summary (Last 24 hours) at 01/14/2020 1108 Last data filed at 01/14/2020 0945 Gross per 24 hour  Intake 680 ml  Output 300 ml  Net 380 ml   Filed Weights   01/12/20 0500 01/13/20 0500 01/14/20 0449  Weight: 79.5 kg 75.8 kg 75.5 kg    Examination:  General  exam: Appears calm and comfortable  Respiratory system: Clear to auscultation. Respiratory effort normal. Cardiovascular system: S1 & S2 heard, RRR. No JVD, murmurs, rubs, gallops or clicks. No pedal edema. Gastrointestinal system: Abdomen is nondistended, soft and mild tender mid abdomen No organomegaly or masses felt. Normal bowel sounds heard. Central nervous system: Alert and oriented x1. No focal neurological deficits. Extremities: Symmetric  Skin: No rashes, lesions or ulcers      Data Reviewed: I have personally reviewed following labs and imaging studies  CBC: Recent Labs  Lab 01/11/20 0656 01/12/20 0457  WBC 6.7 6.7  HGB 12.0* 11.7*  HCT 36.2* 37.2*  MCV 88.1 91.4  PLT PLATELET CLUMPS NOTED ON SMEAR, UNABLE TO ESTIMATE 062   Basic Metabolic Panel: Recent Labs  Lab 01/11/20 0656 01/12/20 0457 01/14/20 0945  NA 134* 140 141  K 5.1 4.5 4.5  CL 101 105 107  CO2 21* 23 16*  GLUCOSE 115* 77 328*  BUN 34* 31* 68*  CREATININE 1.59* 1.32* 2.44*  CALCIUM 9.6 9.7 9.3  MG  --   --  2.5*   GFR: Estimated Creatinine Clearance: 29.4 mL/min (A) (by C-G formula based on SCr of 2.44 mg/dL (H)). Liver Function Tests: Recent Labs  Lab 01/11/20 0656  AST 29  ALT 9  ALKPHOS 82  BILITOT 1.4*  PROT 9.4*  ALBUMIN 4.0   No results for input(s): LIPASE, AMYLASE in the last 168 hours. Recent Labs  Lab 01/14/20 0945  AMMONIA 14   Coagulation Profile: Recent Labs  Lab 01/11/20 1235  INR 1.3*   Cardiac Enzymes: No results for input(s): CKTOTAL, CKMB, CKMBINDEX, TROPONINI in the last 168 hours. BNP (last 3 results) No results for input(s): PROBNP in the last 8760 hours. HbA1C: No results for input(s): HGBA1C in the last 72 hours. CBG: Recent Labs  Lab 01/12/20 2132 01/13/20 1128 01/13/20 1655 01/13/20 2120 01/14/20 0756  GLUCAP 121* 146* 167* 236* 237*   Lipid Profile: No results for input(s): CHOL, HDL, LDLCALC, TRIG, CHOLHDL, LDLDIRECT in the last 72  hours. Thyroid Function Tests: No results for input(s): TSH, T4TOTAL, FREET4, T3FREE, THYROIDAB in the last 72 hours. Anemia Panel: No results for input(s): VITAMINB12, FOLATE, FERRITIN, TIBC, IRON, RETICCTPCT in the last 72 hours. Sepsis Labs: Recent Labs  Lab 01/11/20 0739 01/11/20 1235  LATICACIDVEN 1.2 1.0    Recent Results (from the past 240 hour(s))  SARS Coronavirus 2 by RT PCR (hospital order, performed in [facility] hospital lab) Nasopharyngeal Nasopharyngeal Swab     Status: None   Collection Time: 01/11/20  6:55 AM   Specimen: Nasopharyngeal Swab  Result Value Ref Range Status   SARS Coronavirus 2 NEGATIVE NEGATIVE Final    Comment: (NOTE) SARS-CoV-2 target nucleic acids are NOT DETECTED.  The SARS-CoV-2 RNA is generally detectable in upper and lower respiratory specimens during the acute phase of infection. The lowest concentration of SARS-CoV-2 viral copies this assay can detect is 250 copies / mL. A negative result does not preclude SARS-CoV-2 infection and should not  be used as the sole basis for treatment or other patient management decisions.  A negative result may occur with improper specimen collection / handling, submission of specimen other than nasopharyngeal swab, presence of viral mutation(s) within the areas targeted by this assay, and inadequate number of viral copies (<250 copies / mL). A negative result must be combined with clinical observations, patient history, and epidemiological information.  Fact Sheet for Patients:   StrictlyIdeas.no  Fact Sheet for Healthcare Providers: BankingDealers.co.za  This test is not yet approved or  cleared by the [place] FDA and has been authorized for detection and/or diagnosis of SARS-CoV-2 by FDA under an Emergency Use Authorization (EUA).  This EUA will remain in effect (meaning this test can be used) for the duration of the COVID-19 declaration under  Section 564(b)(1) of the Act, 21 U.S.C. section 360bbb-3(b)(1), unless the authorization is terminated or revoked sooner.  Performed at [facility], [address] [place], [place] [address]   CULTURE, BLOOD (ROUTINE X 2) w Reflex to ID Panel     Status: None (Preliminary result)   Collection Time: 01/11/20 12:35 PM   Specimen: BLOOD  Result Value Ref Range Status   Specimen Description BLOOD BLOOD LEFT HAND  Final   Special Requests   Final    BOTTLES DRAWN AEROBIC AND ANAEROBIC Blood Culture adequate volume   Culture   Final    NO GROWTH 3 DAYS Performed at [facility], [address] [place], [place] [address]    Report Status PENDING  Incomplete  CULTURE, BLOOD (ROUTINE X 2) w Reflex to ID Panel     Status: None (Preliminary result)   Collection Time: 01/11/20 12:35 PM   Specimen: BLOOD  Result Value Ref Range Status   Specimen Description BLOOD RIGHT ANTECUBITAL  Final   Special Requests   Final    BOTTLES DRAWN AEROBIC AND ANAEROBIC Blood Culture adequate volume   Culture   Final    NO GROWTH 3 DAYS Performed at [facility], [address], [place], [place] [address]    Report Status PENDING  Incomplete         Radiology Studies: DG Foot 2 Views Left  Result Date: 01/13/2020 CLINICAL DATA:  Status post amputation. EXAM: LEFT FOOT - 2 VIEW COMPARISON:  01/11/2020 FINDINGS: Status post amputation of the left great toe. The first metatarsal is intact. No acute bony findings. Extensive vascular calcifications. IMPRESSION: Status post amputation of the left great toe. Electronically Signed   By: Marijo Sanes M.D.   On: 01/13/2020 10:54   DG MINI C-ARM IMAGE ONLY  Result Date: 01/13/2020 There is no interpretation for this exam.  This order is for images obtained during a surgical procedure.  Please See "Surgeries" Tab for more information regarding the procedure.        Scheduled Meds: . aspirin EC  81 mg Oral Daily  .  atorvastatin  40 mg Oral QHS  . carvedilol  3.125 mg Oral BID WC  . cholecalciferol  1,000 Units Oral Daily  . enoxaparin (LOVENOX) injection  40 mg Subcutaneous Q24H  . famotidine  20 mg Oral Daily  . fluticasone  2 spray Each Nare Daily  . insulin aspart  0-5 Units Subcutaneous QHS  . insulin aspart  0-9 Units Subcutaneous TID WC  . lisinopril  2.5 mg Oral Daily  . senna-docusate  2 tablet Oral BID  . tamsulosin  0.4 mg Oral Daily   Continuous Infusions: . lactated  ringers       LOS: 2 days    Time spent: 32 minutes    Sharen Hones, MD [facility]   To contact the attending provider between 7A-7P or the covering provider during after hours 7P-7A, please log into the web site [email or web address] and access using universal [place] password for that web site. If you do not have the password, please call the hospital operator.  01/14/2020, 11:08 AM

## 2020-01-14 NOTE — Progress Notes (Signed)
PHARMACIST - PHYSICIAN COMMUNICATION  CONCERNING:  Enoxaparin (Lovenox) for DVT Prophylaxis    RECOMMENDATION: Patient was prescribed enoxaprin 40mg  q24 hours for VTE prophylaxis.   Filed Weights   01/12/20 0500 01/13/20 0500 01/14/20 0449  Weight: 79.5 kg (175 lb 4.3 oz) 75.8 kg (167 lb 1.7 oz) 75.5 kg (166 lb 7.2 oz)    Body mass index is 24.58 kg/m.  Estimated Creatinine Clearance: 29.4 mL/min (A) (by C-G formula based on SCr of 2.44 mg/dL (H)).  Patient is candidate for enoxaparin 30mg  every 24 hours based on CrCl <71ml/min   DESCRIPTION: Pharmacy has adjusted enoxaparin dose per [facility]/[place] policy.  Patient is now receiving enoxaparin 30mg  every 24 hours.  Lu Duffel, PharmD, BCPS Clinical Pharmacist 01/14/2020 11:16 AM

## 2020-01-15 LAB — CBC WITH DIFFERENTIAL/PLATELET
Abs Immature Granulocytes: 0.04 10*3/uL (ref 0.00–0.07)
Basophils Absolute: 0 10*3/uL (ref 0.0–0.1)
Basophils Relative: 0 %
Eosinophils Absolute: 0 10*3/uL (ref 0.0–0.5)
Eosinophils Relative: 0 %
HCT: 34.4 % — ABNORMAL LOW (ref 39.0–52.0)
Hemoglobin: 11.3 g/dL — ABNORMAL LOW (ref 13.0–17.0)
Immature Granulocytes: 0 %
Lymphocytes Relative: 4 %
Lymphs Abs: 0.4 10*3/uL — ABNORMAL LOW (ref 0.7–4.0)
MCH: 28.9 pg (ref 26.0–34.0)
MCHC: 32.8 g/dL (ref 30.0–36.0)
MCV: 88 fL (ref 80.0–100.0)
Monocytes Absolute: 0.9 10*3/uL (ref 0.1–1.0)
Monocytes Relative: 8 %
Neutro Abs: 9.9 10*3/uL — ABNORMAL HIGH (ref 1.7–7.7)
Neutrophils Relative %: 88 %
Platelets: 140 10*3/uL — ABNORMAL LOW (ref 150–400)
RBC: 3.91 MIL/uL — ABNORMAL LOW (ref 4.22–5.81)
RDW: 15.7 % — ABNORMAL HIGH (ref 11.5–15.5)
Smear Review: NORMAL
WBC: 11.2 10*3/uL — ABNORMAL HIGH (ref 4.0–10.5)
nRBC: 0 % (ref 0.0–0.2)

## 2020-01-15 LAB — BASIC METABOLIC PANEL
Anion gap: 18 — ABNORMAL HIGH (ref 5–15)
BUN: 91 mg/dL — ABNORMAL HIGH (ref 8–23)
CO2: 16 mmol/L — ABNORMAL LOW (ref 22–32)
Calcium: 9.5 mg/dL (ref 8.9–10.3)
Chloride: 109 mmol/L (ref 98–111)
Creatinine, Ser: 2.59 mg/dL — ABNORMAL HIGH (ref 0.61–1.24)
GFR calc Af Amer: 28 mL/min — ABNORMAL LOW (ref >60)
GFR calc non Af Amer: 25 mL/min — ABNORMAL LOW (ref >60)
Glucose, Bld: 306 mg/dL — ABNORMAL HIGH (ref 70–99)
Potassium: 3.7 mmol/L (ref 3.5–5.1)
Sodium: 143 mmol/L (ref 135–145)

## 2020-01-15 LAB — MAGNESIUM: Magnesium: 2.7 mg/dL — ABNORMAL HIGH (ref 1.7–2.4)

## 2020-01-15 LAB — GLUCOSE, CAPILLARY
Glucose-Capillary: 313 mg/dL — ABNORMAL HIGH (ref 70–99)
Glucose-Capillary: 284 mg/dL — ABNORMAL HIGH (ref 70–99)
Glucose-Capillary: 238 mg/dL — ABNORMAL HIGH (ref 70–99)
Glucose-Capillary: 236 mg/dL — ABNORMAL HIGH (ref 70–99)

## 2020-01-15 MED ORDER — INSULIN GLARGINE 100 UNIT/ML ~~LOC~~ SOLN
8.0000 [IU] | Freq: Every day | SUBCUTANEOUS | Status: DC
  Administered 2020-01-15 – 2020-01-16 (×2): 8 [IU] via SUBCUTANEOUS
  Filled 2020-01-15 (×2): qty 0.08

## 2020-01-15 NOTE — Consult Note (Signed)
Pharmacy Antibiotic Note  Jason Montes is a 69 y.o. male admitted on 01/11/2020 with osteomyelitis of great left toe s/p amputation.  Patient with PCT of 4.04. Pharmacy has been consulted for Vancomycin dosing. Also receiving Meropenem.  Plan: Continue vancomycin 750 mg IV q24h. Monitor renal function closely. May need to consider random level if renal function continues to worsen.  Weight: 73 kg (160 lb 15 oz)  Temp (24hrs), Avg:98.5 F (36.9 C), Min:97.4 F (36.3 C), Max:99.6 F (37.6 C)  Recent Labs  Lab 01/11/20 0656 01/11/20 0739 01/11/20 1235 01/12/20 0457 01/14/20 0945 01/15/20 0718  WBC 6.7  --   --  6.7  --  11.2*  CREATININE 1.59*  --   --  1.32* 2.44* 2.59*  LATICACIDVEN  --  1.2 1.0  --   --   --     Estimated Creatinine Clearance: 27.7 mL/min (A) (by C-G formula based on SCr of 2.59 mg/dL (H)).    Allergies  Allergen Reactions  . Amlodipine Other (See Comments)  . Norvasc [Amlodipine Besylate]     Unknown  . Penicillins     Childhood allergy, not sure what happens     Antimicrobials this admission: Vancomycin 8/14 >>  Meropenem 8/14 >>   Microbiology results: 8/11 BCx: NGTD  Thank you for allowing pharmacy to be a part of this patient's care.  Tawnya Crook, PharmD 01/15/2020 10:24 AM

## 2020-01-15 NOTE — Progress Notes (Signed)
Daily Progress Note   Subjective  - 2 Days Post-Op  F/u left great toe amp.  Resting comfortably.  Did not awake during change.  Objective Vitals:   01/15/20 0022 01/15/20 0405 01/15/20 0500 01/15/20 0754  BP: (!) 99/52 (!) 105/55  104/69  Pulse: 88 97  67  Resp: 18 20  20   Temp: 98.5 F (36.9 C) 98.6 F (37 C)  (!) 97.4 F (36.3 C)  TempSrc: Oral Oral  Oral  SpO2: 95% 92%  91%  Weight:   73 kg     Physical Exam: Incision coapted.  Skin flaps intact and well perfused.  No s/s infection      Laboratory CBC    Component Value Date/Time   WBC 11.2 (H) 01/15/2020 0718   HGB 11.3 (L) 01/15/2020 0718   HCT 34.4 (L) 01/15/2020 0718   PLT 140 (L) 01/15/2020 0718    BMET    Component Value Date/Time   NA 143 01/15/2020 0718   K 3.7 01/15/2020 0718   CL 109 01/15/2020 0718   CO2 16 (L) 01/15/2020 0718   GLUCOSE 306 (H) 01/15/2020 0718   BUN 91 (H) 01/15/2020 0718   CREATININE 2.59 (H) 01/15/2020 0718   CALCIUM 9.5 01/15/2020 0718   GFRNONAA 25 (L) 01/15/2020 0718   GFRAA 28 (L) 01/15/2020 0718    Assessment/Planning: S/P great toe amputation   Doing well.  Dressing changed.  Incsion and flaps look good.  Should maintain minimal weight to foot.  From podiatry standpoint ok to d/c.   F/U outpt 1-2 weeks.   Keep dressing clean dry and intact.  Samara Deist A  01/15/2020, 10:44 AM

## 2020-01-15 NOTE — Progress Notes (Signed)
PROGRESS NOTE    Jason Montes  YWV:[id number] DOB: [birth year] DOA: 01/11/2020 PCP: [facility]   Chief complaint. Altered mental status  Brief Narrative:  Jason Montes a 69 y.o.malewith medical history significant ofhypertension, hyperlipidemia, diabetes mellitus, stroke, GERD, depression, RLS, PVD, CAD, CKD-3, atrial fibrillation on Eliquis, chronic left greater toe osteomyelitis,sCHF with EF of 40-45%, who presents with altered mental status and overdose ?unintentional.  8/14.  Patient still has significant confusion today.  Worsening renal function, start IV fluids.  Restart lower dose Neurontin.   Started antibiotics with vancomycin and meropenem for elevated procalcitonin level 4.04. Foley catheter was anchored for residual over 1370 mL.      Assessment & Plan:   Principal Problem:   Acute metabolic encephalopathy Active Problems:   Atrial fibrillation (HCC)   CAD (coronary artery disease)   GERD with esophagitis   Chronic systolic CHF (congestive heart failure) (HCC)   Hyperlipidemia   Essential hypertension   Acute renal failure superimposed on stage 3a chronic kidney disease (HCC)   Stage 3a chronic kidney disease   Osteomyelitis of great toe of left foot (HCC)   Type II diabetes mellitus with renal manifestations ([place])   Stroke ([place])   Depression   Suicidal behavior  #1. Acute metabolic encephalopathy. Appear to be secondary to infection. Condition gradually improving. Patient is more awake today, he was able to maintain a conversation. But still confused. Appreciate psychiatry consult. Continue Haldol as scheduled.  2. Osteomyelitis of the great toe on the left. Patient has significant elevation of procalcitonin level, continue antibiotics with vancomycin and meropenem.  3. Acute kidney failure on chronic kidney disease stage IIIa secondary to infection as well as to urinary retention. Foley catheter was anchored. Continue IV  fluids for today. Continue Flomax for BPH.  4.  Paroxysmal atrial fibrillation. Currently in sinus.  5.  Chronic systolic congestive heart failure with ejection fraction 40- 45%. Patient still does not have any volume overload.  Continue gentle rehydration.  6.  Uncontrolled type 2 diabetes with hyperglycemia. Added scheduled Lantus.   DVT prophylaxis: Lovenox Code Status: Full Family Communication: Daughter updated. Disposition Plan:   Patient came from: Home                                                                                                                           Anticipated d/c place:  Barriers to d/c OR conditions which need to be met to effect a safe d/c:   Consultants:   Podiatry. Psychiatry   Procedures:   Left hallux amputation  Subjective: Patient is still drowsy, but less confused.  We were able to have a short conversation. Denies any short of breath or cough. No abdominal pain or nausea vomiting. Appetite still poor  Objective: Vitals:   01/15/20 0022 01/15/20 0405 01/15/20 0500 01/15/20 0754  BP: (!) 99/52 (!) 105/55  104/69  Pulse: 88 97  67  Resp: '18 20  20  '$ Temp:  98.5 F (36.9 C) 98.6 F (37 C)  (!) 97.4 F (36.3 C)  TempSrc: Oral Oral  Oral  SpO2: 95% 92%  91%  Weight:   73 kg     Intake/Output Summary (Last 24 hours) at 01/15/2020 1041 Last data filed at 01/15/2020 0900 Gross per 24 hour  Intake 1810.56 ml  Output 3950 ml  Net -2139.44 ml   Filed Weights   01/13/20 0500 01/14/20 0449 01/15/20 0500  Weight: 75.8 kg 75.5 kg 73 kg    Examination:  General exam: Appears calm and comfortable  Respiratory system: Clear to auscultation. Respiratory effort normal. Cardiovascular system: S1 & S2 heard, RRR. No JVD, murmurs, rubs, gallops or clicks. No pedal edema. Gastrointestinal system: Abdomen is nondistended, soft and nontender. No organomegaly or masses felt. Normal bowel sounds heard. Central nervous system:  Drowsy and oriented x2. No focal neurological deficits. Extremities: Symmetric  Skin: No rashes, lesions or ulcers Psychiatry: Judgement and insight appear normal. Mood & affect appropriate.     Data Reviewed: I have personally reviewed following labs and imaging studies  CBC: Recent Labs  Lab 01/11/20 0656 01/12/20 0457 01/15/20 0718  WBC 6.7 6.7 11.2*  NEUTROABS  --   --  9.9*  HGB 12.0* 11.7* 11.3*  HCT 36.2* 37.2* 34.4*  MCV 88.1 91.4 88.0  PLT PLATELET CLUMPS NOTED ON SMEAR, UNABLE TO ESTIMATE 181 562*   Basic Metabolic Panel: Recent Labs  Lab 01/11/20 0656 01/12/20 0457 01/14/20 0945 01/15/20 0718  NA 134* 140 141 143  K 5.1 4.5 4.5 3.7  CL 101 105 107 109  CO2 21* 23 16* 16*  GLUCOSE 115* 77 328* 306*  BUN 34* 31* 68* 91*  CREATININE 1.59* 1.32* 2.44* 2.59*  CALCIUM 9.6 9.7 9.3 9.5  MG  --   --  2.5* 2.7*   GFR: Estimated Creatinine Clearance: 27.7 mL/min (A) (by C-G formula based on SCr of 2.59 mg/dL (H)). Liver Function Tests: Recent Labs  Lab 01/11/20 0656  AST 29  ALT 9  ALKPHOS 82  BILITOT 1.4*  PROT 9.4*  ALBUMIN 4.0   No results for input(s): LIPASE, AMYLASE in the last 168 hours. Recent Labs  Lab 01/14/20 0945  AMMONIA 14   Coagulation Profile: Recent Labs  Lab 01/11/20 1235  INR 1.3*   Cardiac Enzymes: No results for input(s): CKTOTAL, CKMB, CKMBINDEX, TROPONINI in the last 168 hours. BNP (last 3 results) No results for input(s): PROBNP in the last 8760 hours. HbA1C: No results for input(s): HGBA1C in the last 72 hours. CBG: Recent Labs  Lab 01/14/20 0756 01/14/20 1210 01/14/20 1600 01/14/20 2157 01/15/20 0754  GLUCAP 237* 386* 349* 273* 313*   Lipid Profile: No results for input(s): CHOL, HDL, LDLCALC, TRIG, CHOLHDL, LDLDIRECT in the last 72 hours. Thyroid Function Tests: No results for input(s): TSH, T4TOTAL, FREET4, T3FREE, THYROIDAB in the last 72 hours. Anemia Panel: No results for input(s): VITAMINB12, FOLATE,  FERRITIN, TIBC, IRON, RETICCTPCT in the last 72 hours. Sepsis Labs: Recent Labs  Lab 01/11/20 0739 01/11/20 1235 01/14/20 0945  PROCALCITON  --   --  4.04  LATICACIDVEN 1.2 1.0  --     Recent Results (from the past 240 hour(s))  SARS Coronavirus 2 by RT PCR (hospital order, performed in [facility] hospital lab) Nasopharyngeal Nasopharyngeal Swab     Status: None   Collection Time: 01/11/20  6:55 AM   Specimen: Nasopharyngeal Swab  Result Value Ref Range Status   SARS Coronavirus 2 NEGATIVE  NEGATIVE Final    Comment: (NOTE) SARS-CoV-2 target nucleic acids are NOT DETECTED.  The SARS-CoV-2 RNA is generally detectable in upper and lower respiratory specimens during the acute phase of infection. The lowest concentration of SARS-CoV-2 viral copies this assay can detect is 250 copies / mL. A negative result does not preclude SARS-CoV-2 infection and should not be used as the sole basis for treatment or other patient management decisions.  A negative result may occur with improper specimen collection / handling, submission of specimen other than nasopharyngeal swab, presence of viral mutation(s) within the areas targeted by this assay, and inadequate number of viral copies (<250 copies / mL). A negative result must be combined with clinical observations, patient history, and epidemiological information.  Fact Sheet for Patients:   StrictlyIdeas.no  Fact Sheet for Healthcare Providers: BankingDealers.co.za  This test is not yet approved or  cleared by the [place] FDA and has been authorized for detection and/or diagnosis of SARS-CoV-2 by FDA under an Emergency Use Authorization (EUA).  This EUA will remain in effect (meaning this test can be used) for the duration of the COVID-19 declaration under Section 564(b)(1) of the Act, 21 U.S.C. section 360bbb-3(b)(1), unless the authorization is terminated or revoked  sooner.  Performed at [facility], [address] [place], [place] [address]   CULTURE, BLOOD (ROUTINE X 2) w Reflex to ID Panel     Status: None (Preliminary result)   Collection Time: 01/11/20 12:35 PM   Specimen: BLOOD  Result Value Ref Range Status   Specimen Description BLOOD BLOOD LEFT HAND  Final   Special Requests   Final    BOTTLES DRAWN AEROBIC AND ANAEROBIC Blood Culture adequate volume   Culture   Final    NO GROWTH 4 DAYS Performed at [facility], [address], [place], [place] [address]    Report Status PENDING  Incomplete  CULTURE, BLOOD (ROUTINE X 2) w Reflex to ID Panel     Status: None (Preliminary result)   Collection Time: 01/11/20 12:35 PM   Specimen: BLOOD  Result Value Ref Range Status   Specimen Description BLOOD RIGHT ANTECUBITAL  Final   Special Requests   Final    BOTTLES DRAWN AEROBIC AND ANAEROBIC Blood Culture adequate volume   Culture   Final    NO GROWTH 4 DAYS Performed at [facility], [address] [place], [place] [address]    Report Status PENDING  Incomplete         Radiology Studies: DG Abd 2 Views  Result Date: 01/14/2020 CLINICAL DATA:  Left foot surgery yesterday with onset of abdominal pain today. EXAM: ABDOMEN - 2 VIEW COMPARISON:  CT 04/08/2019 FINDINGS: Examination demonstrates moderate gaseous distention of the stomach. Air is present throughout the colon. Moderate fecal retention throughout the colon most prominent over the rectosigmoid colon. Surgical clips over the left upper quadrant. Degenerative changes of the spine and hips. Partially visualized hardware over the right femur intact. IMPRESSION: Moderate gaseous distention of the stomach as a degree of gastric outlet obstruction is possible. Moderate fecal retention throughout the colon most prominent over the rectosigmoid colon. Electronically Signed   By: Marin Olp M.D.   On: 01/14/2020 11:43        Scheduled Meds: . aspirin  EC  81 mg Oral Daily  . atorvastatin  40 mg Oral QHS  . carvedilol  3.125 mg Oral BID WC  . Chlorhexidine Gluconate Cloth  6 each Topical Daily  .  cholecalciferol  1,000 Units Oral Daily  . diphenhydrAMINE  50 mg Oral QHS  . enoxaparin (LOVENOX) injection  30 mg Subcutaneous Q24H  . famotidine  20 mg Oral Daily  . fluticasone  2 spray Each Nare Daily  . gabapentin  200 mg Oral BID  . haloperidol  0.5 mg Oral BID  . insulin aspart  0-5 Units Subcutaneous QHS  . insulin aspart  0-9 Units Subcutaneous TID WC  . OLANZapine zydis  10 mg Oral QHS  . senna-docusate  2 tablet Oral BID  . tamsulosin  0.4 mg Oral Daily   Continuous Infusions: . sodium chloride Stopped (01/14/20 1734)  . sodium chloride 5 mL/hr at 01/14/20 1900  . lactated ringers 75 mL/hr at 01/15/20 0932  . meropenem (MERREM) IV 1 g (01/15/20 0506)  . vancomycin       LOS: 3 days    Time spent: 36 minutes    Sharen Hones, MD [facility]   To contact the attending provider between 7A-7P or the covering provider during after hours 7P-7A, please log into the web site [email or web address] and access using universal [place] password for that web site. If you do not have the password, please call the hospital operator.  01/15/2020, 10:41 AM

## 2020-01-16 ENCOUNTER — Encounter
Admission: EM | Disposition: A | Discharge status: Rehabilitation Facility | Payer: Self-pay | Source: Home / Self Care | Attending: Internal Medicine

## 2020-01-16 DIAGNOSIS — I70235 Atherosclerosis of native arteries of right leg with ulceration of other part of foot: Secondary | ICD-10-CM

## 2020-01-16 HISTORY — PX: LOWER EXTREMITY ANGIOGRAPHY: CATH118251

## 2020-01-16 LAB — CBC WITH DIFFERENTIAL/PLATELET
Abs Immature Granulocytes: 0.07 10*3/uL (ref 0.00–0.07)
Basophils Absolute: 0 10*3/uL (ref 0.0–0.1)
Basophils Relative: 0 %
Eosinophils Absolute: 0 10*3/uL (ref 0.0–0.5)
Eosinophils Relative: 0 %
HCT: 42.5 % (ref 39.0–52.0)
Hemoglobin: 13.3 g/dL (ref 13.0–17.0)
Immature Granulocytes: 1 %
Lymphocytes Relative: 3 %
Lymphs Abs: 0.3 10*3/uL — ABNORMAL LOW (ref 0.7–4.0)
MCH: 28.8 pg (ref 26.0–34.0)
MCHC: 31.3 g/dL (ref 30.0–36.0)
MCV: 92 fL (ref 80.0–100.0)
Monocytes Absolute: 0.8 10*3/uL (ref 0.1–1.0)
Monocytes Relative: 7 %
Neutro Abs: 9.3 10*3/uL — ABNORMAL HIGH (ref 1.7–7.7)
Neutrophils Relative %: 89 %
RBC: 4.62 MIL/uL (ref 4.22–5.81)
RDW: 15.9 % — ABNORMAL HIGH (ref 11.5–15.5)
WBC: 10.5 10*3/uL (ref 4.0–10.5)
nRBC: 0 % (ref 0.0–0.2)

## 2020-01-16 LAB — GLUCOSE, CAPILLARY
Glucose-Capillary: 254 mg/dL — ABNORMAL HIGH (ref 70–99)
Glucose-Capillary: 246 mg/dL — ABNORMAL HIGH (ref 70–99)
Glucose-Capillary: 220 mg/dL — ABNORMAL HIGH (ref 70–99)
Glucose-Capillary: 214 mg/dL — ABNORMAL HIGH (ref 70–99)
Glucose-Capillary: 219 mg/dL — ABNORMAL HIGH (ref 70–99)

## 2020-01-16 LAB — CULTURE, BLOOD (ROUTINE X 2)
Culture: NO GROWTH
Culture: NO GROWTH
Special Requests: ADEQUATE
Special Requests: ADEQUATE

## 2020-01-16 LAB — BASIC METABOLIC PANEL
Anion gap: 17 — ABNORMAL HIGH (ref 5–15)
BUN: 87 mg/dL — ABNORMAL HIGH (ref 8–23)
CO2: 19 mmol/L — ABNORMAL LOW (ref 22–32)
Calcium: 9.8 mg/dL (ref 8.9–10.3)
Chloride: 109 mmol/L (ref 98–111)
Creatinine, Ser: 2.48 mg/dL — ABNORMAL HIGH (ref 0.61–1.24)
GFR calc Af Amer: 30 mL/min — ABNORMAL LOW (ref >60)
GFR calc non Af Amer: 26 mL/min — ABNORMAL LOW (ref >60)
Glucose, Bld: 234 mg/dL — ABNORMAL HIGH (ref 70–99)
Potassium: 5.1 mmol/L (ref 3.5–5.1)
Sodium: 145 mmol/L (ref 135–145)

## 2020-01-16 LAB — MRSA PCR SCREENING: MRSA by PCR: NEGATIVE

## 2020-01-16 LAB — MAGNESIUM: Magnesium: 2.9 mg/dL — ABNORMAL HIGH (ref 1.7–2.4)

## 2020-01-16 SURGERY — LOWER EXTREMITY ANGIOGRAPHY
Anesthesia: Moderate Sedation | Laterality: Left

## 2020-01-16 MED ORDER — HEPARIN SODIUM (PORCINE) 1000 UNIT/ML IJ SOLN
INTRAMUSCULAR | Status: DC | PRN
  Administered 2020-01-16: 4000 [IU] via INTRAVENOUS

## 2020-01-16 MED ORDER — HEPARIN SODIUM (PORCINE) 1000 UNIT/ML IJ SOLN
INTRAMUSCULAR | Status: AC
  Filled 2020-01-16: qty 1

## 2020-01-16 MED ORDER — MIDAZOLAM HCL 2 MG/2ML IJ SOLN
INTRAMUSCULAR | Status: DC | PRN
  Administered 2020-01-16 (×2): 1 mg via INTRAVENOUS

## 2020-01-16 MED ORDER — INSULIN GLARGINE 100 UNIT/ML ~~LOC~~ SOLN
12.0000 [IU] | Freq: Every day | SUBCUTANEOUS | Status: DC
  Administered 2020-01-17 – 2020-01-22 (×6): 12 [IU] via SUBCUTANEOUS
  Filled 2020-01-16 (×7): qty 0.12

## 2020-01-16 MED ORDER — FENTANYL CITRATE (PF) 100 MCG/2ML IJ SOLN
INTRAMUSCULAR | Status: AC
  Filled 2020-01-16: qty 2

## 2020-01-16 MED ORDER — IODIXANOL 320 MG/ML IV SOLN
INTRAVENOUS | Status: DC | PRN
  Administered 2020-01-16: 50 mL via INTRA_ARTERIAL

## 2020-01-16 MED ORDER — ACETAMINOPHEN 325 MG PO TABS
ORAL_TABLET | ORAL | Status: AC
  Administered 2020-01-16: 650 mg via ORAL
  Filled 2020-01-16: qty 2

## 2020-01-16 MED ORDER — CLINDAMYCIN PHOSPHATE 300 MG/50ML IV SOLN
300.0000 mg | Freq: Once | INTRAVENOUS | Status: AC
  Administered 2020-01-16: 300 mg via INTRAVENOUS
  Filled 2020-01-16: qty 50

## 2020-01-16 MED ORDER — MIDAZOLAM HCL 2 MG/2ML IJ SOLN
INTRAMUSCULAR | Status: AC
  Filled 2020-01-16: qty 2

## 2020-01-16 MED ORDER — SODIUM CHLORIDE 0.9 % IV SOLN: INTRAVENOUS | Status: DC

## 2020-01-16 MED ORDER — FENTANYL CITRATE (PF) 100 MCG/2ML IJ SOLN
INTRAMUSCULAR | Status: DC | PRN
  Administered 2020-01-16 (×2): 50 ug via INTRAVENOUS

## 2020-01-16 MED ORDER — LACTATED RINGERS IV SOLN: INTRAVENOUS | Status: DC

## 2020-01-16 SURGICAL SUPPLY — 18 items
BALLN STERLING OTW 2X150X150 (BALLOONS) ×3
BALLN ULTRVRSE 2.5X300X150 (BALLOONS) ×3
BALLOON STERLING OTW 2X150X150 (BALLOONS) IMPLANT
BALLOON ULTRVRSE 2.5X300X150 (BALLOONS) IMPLANT
CATH BEACON 5 .038 100 VERT TP (CATHETERS) ×2 IMPLANT
CATH CXI SUPP 2.6F 150 ANG (CATHETERS) ×2 IMPLANT
CATH PIG 70CM (CATHETERS) ×2 IMPLANT
DEVICE PRESTO INFLATION (MISCELLANEOUS) ×2 IMPLANT
DEVICE SAFEGUARD 24CM (GAUZE/BANDAGES/DRESSINGS) ×4 IMPLANT
DEVICE STARCLOSE SE CLOSURE (Vascular Products) ×2 IMPLANT
PACK ANGIOGRAPHY (CUSTOM PROCEDURE TRAY) ×3 IMPLANT
SHEATH BRITE TIP 5FRX11 (SHEATH) ×2 IMPLANT
SHEATH RAABE 6FRX70 (SHEATH) ×2 IMPLANT
SYR MEDRAD MARK 7 150ML (SYRINGE) ×2 IMPLANT
TUBING CONTRAST HIGH PRESS 72 (TUBING) ×2 IMPLANT
WIRE G V18X300CM (WIRE) ×2 IMPLANT
WIRE J 3MM .035X145CM (WIRE) ×2 IMPLANT
WIRE MAGIC TORQUE 260C (WIRE) ×2 IMPLANT

## 2020-01-16 NOTE — Op Note (Signed)
[facility]  Percutaneous Study/Intervention Procedural Note   Date of Surgery: 01/16/2020  Surgeon(s):Jestina Stephani    Assistants:none  Pre-operative Diagnosis: PAD with ulceration left foot  Post-operative diagnosis:  Same  Procedure(s) Performed:             1.  Ultrasound guidance for vascular access right femoral artery             2.  Catheter placement into left common femoral artery from right femoral approach             3.  Aortogram and selective left lower extremity angiogram including selective images of the left anterior tibial and left peroneal arteries             4.  Percutaneous transluminal angioplasty of left anterior tibial artery with 2.5 mm diameter by 30 cm length angioplasty balloon down to the distal segment and then with a 2 mm diameter by 15 cm length angioplasty balloon down into the foot             5.  Percutaneous transluminal angioplasty of the left peroneal artery with 2.5 mm diameter angioplasty balloon  6.  StarClose closure device right femoral artery  EBL: 3 cc  Contrast: 50 cc  Fluoro Time: 5.5 minutes  Moderate Conscious Sedation Time: approximately 30 minutes using 2 mg of Versed and 100 mcg of Fentanyl              Indications:  Patient is a 69 y.o.male with persistent ulceration and recent left great toe amputation.  He has a known history of PAD status post intervention earlier this year. The patient is brought in for angiography for further evaluation and potential treatment.  Due to the limb threatening nature of the situation, angiogram was performed for attempted limb salvage. The patient is aware that if the procedure fails, amputation would be expected.  The patient also understands that even with successful revascularization, amputation may still be required due to the severity of the situation.  Risks and benefits are discussed and informed consent is obtained.   Procedure:  The patient was identified and  appropriate procedural time out was performed.  The patient was then placed supine on the table and prepped and draped in the usual sterile fashion. Moderate conscious sedation was administered during a face to face encounter with the patient throughout the procedure with my supervision of the RN administering medicines and monitoring the patient's vital signs, pulse oximetry, telemetry and mental status throughout from the start of the procedure until the patient was taken to the recovery room. Ultrasound was used to evaluate the right common femoral artery.  It was patent .  A digital ultrasound image was acquired.  A Seldinger needle was used to access the right common femoral artery under direct ultrasound guidance and a permanent image was performed.  A 0.035 J wire was advanced without resistance and a 5Fr sheath was placed.  Pigtail catheter was placed into the aorta and an AP aortogram was performed. This demonstrated normal renal arteries and normal aorta and iliac segments without significant stenosis. I then crossed the aortic bifurcation and advanced to the left femoral head. Selective left lower extremity angiogram was then performed.  The circulation time was extremely slow and the catheter was advanced into the tibial vessels selectively to evaluate these for better opacification.  Both the anterior tibial and peroneal arteries were evaluated.  This demonstrated relatively normal common femoral artery, profunda femoris artery,  and superficial femoral artery.  The popliteal artery in the area of previous intervention had no residual hemodynamically significant stenosis.  The anterior tibial artery was the best runoff to the foot but it was diseased with recurrent narrowing in the proximal segment of greater than 70%, in the midsegment of 60 to 70%, and a distal segment stenosis of 70 to 80%.  This was best seen on selective imaging after the Kumpe catheter was advanced down to the proximal anterior  tibial artery.  The peroneal artery was also evaluated with selective imaging of the catheter placed in the tibioperoneal trunk showing the mid segment of the peroneal artery to have about an 80% stenosis and this to be a smaller vessel.  The posterior tibial artery was chronically occluded with no distal reconstitution. It was felt that it was in the patient's best interest to proceed with intervention after these images to avoid a second procedure and a larger amount of contrast and fluoroscopy based off of the findings from the initial angiogram. The patient was systemically heparinized and a 6 French 70 cm sheath was then placed over the Terumo Advantage wire. I then used a Kumpe catheter and the advantage wire to navigate down first into the anterior tibial artery then exchanged for a 0.018 wire.  I then cross the stenoses and parked the wire well into the foot.  A 2.5 mm diameter by 30 cm length angioplasty balloon was inflated from the origin of the anterior tibial artery down to the distal anterior tibial artery just above the ankle.  This was taken up to 8 atm for 1 minute.  The more distal anterior tibial artery down into the dorsalis pedis artery and the foot was then treated with a 2 mm diameter by 15 cm length angioplasty balloon inflated to 14 atm for 1 minute.  Completion imaging showed marked improvement although there was still significant small vessel disease in the foot.  I then turned my attention to the peroneal stenosis try to provide a second runoff vessel.  Using a CXI catheter and the V 18 wire was able to get across the stenosis without difficulty and then treat the peroneal artery with a 2.5 mm diameter angioplasty balloon as well.  This was inflated to 6 atm for 1 minute.  Completion imaging showed only about a 20% residual stenosis in the peroneal artery.  The proximal anterior tibial artery only had about a 10% stenosis with stenosis in the 20 to 30% range in the mid and distal anterior  tibial artery. I elected to terminate the procedure. The sheath was removed and StarClose closure device was deployed in the right femoral artery with excellent hemostatic result. The patient was taken to the recovery room in stable condition having tolerated the procedure well.  Findings:               Aortogram:  Normal renal arteries, normal aorta and iliac arteries without significant stenosis             Left lower Extremity:  Relatively normal common femoral artery, profunda femoris artery, and superficial femoral artery.  The popliteal artery in the area of previous intervention had no residual hemodynamically significant stenosis.  The anterior tibial artery was the best runoff to the foot but it was diseased with recurrent narrowing in the proximal segment of greater than 70%, in the midsegment of 60 to 70%, and a distal segment stenosis of 70 to 80%.  This was best seen on selective  imaging after the Kumpe catheter was advanced down to the proximal anterior tibial artery.  The peroneal artery was also evaluated with selective imaging of the catheter placed in the tibioperoneal trunk showing the mid segment of the peroneal artery to have about an 80% stenosis and this to be a smaller vessel.  The posterior tibial artery was chronically occluded with no distal reconstitution.  It should be noted that the image quality was quite poor particularly with more proximal injection due to his cardiac dysfunction and very slow circulation time.  This was one of the reasons that selective imaging of the tibial vessels was needed.   Disposition: Patient was taken to the recovery room in stable condition having tolerated the procedure well.  Complications: None  Jason Montes 01/16/2020 3:25 PM   This note was created with Dragon Medical transcription system. Any errors in dictation are purely unintentional.

## 2020-01-16 NOTE — Care Management Important Message (Signed)
Important Message  Patient Details  Name: Jason Montes MRN: [id number] Date of Birth: 01/28/1951   Medicare Important Message Given:  Yes     Loann Quill 01/16/2020, 11:35 AM

## 2020-01-16 NOTE — Progress Notes (Signed)
PROGRESS NOTE    Jason Montes  EPP:[id number] DOB: 1951-01-25 DOA: 01/11/2020 PCP: [facility]    Brief Narrative:  Jason Severe [name] a 69 y.o.malewith medical history significant ofhypertension, hyperlipidemia, diabetes mellitus, stroke, GERD, depression, RLS, PVD, CAD, CKD-3, atrial fibrillation on Eliquis, chronic left greater toe osteomyelitis,sCHF with EF of 40-45%, who presents with altered mental status and overdose ?unintentional.  8/14. Patient still has significant confusion today. Worsening renal function, start IV fluids. Restart lower dose Neurontin.  Started antibiotics with vancomycin and meropenem for elevated procalcitonin level 4.04. Foley catheter was anchored for residual over 1370 ml.   8/16.  Patient condition much improved.  Not significant confusion.  Continue antibiotics.  Assessment & Plan:   Principal Problem:   Acute metabolic encephalopathy Active Problems:   Atrial fibrillation (HCC)   CAD (coronary artery disease)   GERD with esophagitis   Chronic systolic CHF (congestive heart failure) (HCC)   Hyperlipidemia   Essential hypertension   Acute renal failure superimposed on stage 3a chronic kidney disease (HCC)   Stage 3a chronic kidney disease   Osteomyelitis of great toe of left foot (HCC)   Type II diabetes mellitus with renal manifestations (Jason Montes)   Stroke (Jason Montes)   Depression   Suicidal behavior  #1.  Acute metabolic encephalopathy. Appear to be secondary to infection and severe urinary tension. Condition had improved today. Seen by psychiatry, continue some Haldol. Patient denies any suicide ideation this time.  We will ask psychiatry to see patient again when patient mental status improves.  #2.  Osteomyelitis of the great toe on the left. Continue vancomycin and meropenem, day #3.  Planning to complete 5 days.  3.  Acute kidney injury on chronic kidney disease stage IIIa. Continue some IV fluid for  today.  4.  Paroxysmal atrial defibrillation. Currently in sinus.  5.  Chronic systolic congestive heart failure. No evidence of volume overload at this time.  Continue gentle rehydration for acute renal failure.  6.  Controlled type 2 diabetes with hyperglycemia. Increase Lantus dose.  DVT prophylaxis:Lovenox Code Status:Full Family Communication: Daughter updated. Disposition Plan:  Patient came from:Home  Anticipated d/c place:  Barriers to d/c OR conditions which need to be met to effect a safe d/c:   Consultants:  Podiatry. Psychiatry   Procedures:  Left hallux amputation  Subjective: Patient doing much better today.  He is fully awake, no confusion today. Denies any short of breath or cough. No nausea vomiting abdominal pain.  N.p.o. pending vascular procedure.   Objective: Vitals:   01/16/20 0403 01/16/20 0428 01/16/20 0500 01/16/20 1017  BP:    (!) 126/51  Pulse:  (!) 45  (!) 49  Resp:    17  Temp: (!) 100.8 F (38.2 C) 98.7 F (37.1 C)  99 F (37.2 C)  TempSrc: Oral Oral  Axillary  SpO2:  97%  95%  Weight:   72.6 kg   Reviewed the chart, patient temperature at 01/16/2020 at 0401 was 98.7.  Intake/Output Summary (Last 24 hours) at 01/16/2020 1210 Last data filed at 01/16/2020 0955 Gross per 24 hour  Intake 2758.87 ml  Output --  Net 2758.87 ml   Filed Weights   01/14/20 0449 01/15/20 0500 01/16/20 0500  Weight: 75.5 kg 73 kg 72.6 kg    Examination:  General exam: Appears calm and comfortable  Respiratory system: Clear to auscultation. Respiratory effort normal. Cardiovascular system: Regular. No JVD, murmurs, rubs, gallops or clicks. No pedal edema. Gastrointestinal system: Abdomen  is nondistended, soft and nontender. No organomegaly or masses felt. Normal bowel sounds heard. Central nervous system: Alert and  oriented. No focal neurological deficits. Extremities: Symmetric  Skin: No rashes, lesions or ulcers Psychiatry:  Mood & affect appropriate.     Data Reviewed: I have personally reviewed following labs and imaging studies  CBC: Recent Labs  Lab 01/11/20 0656 01/12/20 0457 01/15/20 0718 01/16/20 0502  WBC 6.7 6.7 11.2* 10.5  NEUTROABS  --   --  9.9* 9.3*  HGB 12.0* 11.7* 11.3* 13.3  HCT 36.2* 37.2* 34.4* 42.5  MCV 88.1 91.4 88.0 92.0  PLT PLATELET CLUMPS NOTED ON SMEAR, UNABLE TO ESTIMATE 181 140* PLATELET CLUMPING, SUGGEST RECOLLECTION OF SAMPLE IN CITRATE TUBE.   Basic Metabolic Panel: Recent Labs  Lab 01/11/20 0656 01/12/20 0457 01/14/20 0945 01/15/20 0718 01/16/20 0502  NA 134* 140 141 143 145  K 5.1 4.5 4.5 3.7 5.1  CL 101 105 107 109 109  CO2 21* 23 16* 16* 19*  GLUCOSE 115* 77 328* 306* 234*  BUN 34* 31* 68* 91* 87*  CREATININE 1.59* 1.32* 2.44* 2.59* 2.48*  CALCIUM 9.6 9.7 9.3 9.5 9.8  MG  --   --  2.5* 2.7* 2.9*   GFR: Estimated Creatinine Clearance: 28.9 mL/min (A) (by C-G formula based on SCr of 2.48 mg/dL (H)). Liver Function Tests: Recent Labs  Lab 01/11/20 0656  AST 29  ALT 9  ALKPHOS 82  BILITOT 1.4*  PROT 9.4*  ALBUMIN 4.0   No results for input(s): LIPASE, AMYLASE in the last 168 hours. Recent Labs  Lab 01/14/20 0945  AMMONIA 14   Coagulation Profile: Recent Labs  Lab 01/11/20 1235  INR 1.3*   Cardiac Enzymes: No results for input(s): CKTOTAL, CKMB, CKMBINDEX, TROPONINI in the last 168 hours. BNP (last 3 results) No results for input(s): PROBNP in the last 8760 hours. HbA1C: No results for input(s): HGBA1C in the last 72 hours. CBG: Recent Labs  Lab 01/15/20 1148 01/15/20 1636 01/15/20 2123 01/16/20 0745 01/16/20 1126  GLUCAP 284* 238* 236* 254* 246*   Lipid Profile: No results for input(s): CHOL, HDL, LDLCALC, TRIG, CHOLHDL, LDLDIRECT in the last 72 hours. Thyroid Function Tests: No results for input(s): TSH,  T4TOTAL, FREET4, T3FREE, THYROIDAB in the last 72 hours. Anemia Panel: No results for input(s): VITAMINB12, FOLATE, FERRITIN, TIBC, IRON, RETICCTPCT in the last 72 hours. Sepsis Labs: Recent Labs  Lab 01/11/20 0739 01/11/20 1235 01/14/20 0945  PROCALCITON  --   --  4.04  LATICACIDVEN 1.2 1.0  --     Recent Results (from the past 240 hour(s))  SARS Coronavirus 2 by RT PCR (hospital order, performed in [facility] hospital lab) Nasopharyngeal Nasopharyngeal Swab     Status: None   Collection Time: 01/11/20  6:55 AM   Specimen: Nasopharyngeal Swab  Result Value Ref Range Status   SARS Coronavirus 2 NEGATIVE NEGATIVE Final    Comment: (NOTE) SARS-CoV-2 target nucleic acids are NOT DETECTED.  The SARS-CoV-2 RNA is generally detectable in upper and lower respiratory specimens during the acute phase of infection. The lowest concentration of SARS-CoV-2 viral copies this assay can detect is 250 copies / mL. A negative result does not preclude SARS-CoV-2 infection and should not be used as the sole basis for treatment or other patient management decisions.  A negative result may occur with improper specimen collection / handling, submission of specimen other than nasopharyngeal swab, presence of viral mutation(s) within the areas targeted by this assay, and  inadequate number of viral copies (<250 copies / mL). A negative result must be combined with clinical observations, patient history, and epidemiological information.  Fact Sheet for Patients:   StrictlyIdeas.no  Fact Sheet for Healthcare Providers: BankingDealers.co.za  This test is not yet approved or  cleared by the [place] FDA and has been authorized for detection and/or diagnosis of SARS-CoV-2 by FDA under an Emergency Use Authorization (EUA).  This EUA will remain in effect (meaning this test can be used) for the duration of the COVID-19 declaration under Section 564(b)(1)  of the Act, 21 U.S.C. section 360bbb-3(b)(1), unless the authorization is terminated or revoked sooner.  Performed at [facility], [address] [place], [place] [address]   CULTURE, BLOOD (ROUTINE X 2) w Reflex to ID Panel     Status: None   Collection Time: 01/11/20 12:35 PM   Specimen: BLOOD  Result Value Ref Range Status   Specimen Description BLOOD BLOOD LEFT HAND  Final   Special Requests   Final    BOTTLES DRAWN AEROBIC AND ANAEROBIC Blood Culture adequate volume   Culture   Final    NO GROWTH 5 DAYS Performed at [facility], [address] [place], [place] [address]    Report Status 01/16/2020 FINAL  Final  CULTURE, BLOOD (ROUTINE X 2) w Reflex to ID Panel     Status: None   Collection Time: 01/11/20 12:35 PM   Specimen: BLOOD  Result Value Ref Range Status   Specimen Description BLOOD RIGHT ANTECUBITAL  Final   Special Requests   Final    BOTTLES DRAWN AEROBIC AND ANAEROBIC Blood Culture adequate volume   Culture   Final    NO GROWTH 5 DAYS Performed at [facility], [address] [place], [place] [address]    Report Status 01/16/2020 FINAL  Final  MRSA PCR Screening     Status: None   Collection Time: 01/16/20  9:57 AM   Specimen: Nasal Mucosa; Nasopharyngeal  Result Value Ref Range Status   MRSA by PCR NEGATIVE NEGATIVE Final    Comment:        The GeneXpert MRSA Assay (FDA approved for NASAL specimens only), is one component of a comprehensive MRSA colonization surveillance program. It is not intended to diagnose MRSA infection nor to guide or monitor treatment for MRSA infections. Performed at [facility], [address] [place], [place] [address]          Radiology Studies: No results found.      Scheduled Meds: . aspirin EC  81 mg Oral Daily  . atorvastatin  40 mg Oral QHS  . carvedilol  3.125 mg Oral BID WC  . Chlorhexidine Gluconate Cloth  6 each Topical Daily  . cholecalciferol  1,000  Units Oral Daily  . diphenhydrAMINE  50 mg Oral QHS  . enoxaparin (LOVENOX) injection  30 mg Subcutaneous Q24H  . famotidine  20 mg Oral Daily  . fluticasone  2 spray Each Nare Daily  . gabapentin  200 mg Oral BID  . haloperidol  0.5 mg Oral BID  . insulin aspart  0-5 Units Subcutaneous QHS  . insulin aspart  0-9 Units Subcutaneous TID WC  . insulin glargine  8 Units Subcutaneous Daily  . OLANZapine zydis  10 mg Oral QHS  . senna-docusate  2 tablet Oral BID  . tamsulosin  0.4 mg Oral Daily   Continuous Infusions: . sodium chloride Stopped (01/15/20 1830)  . sodium chloride Stopped (01/15/20  0913)  . clindamycin (CLEOCIN) IV    . lactated ringers    . meropenem (MERREM) IV Stopped (01/16/20 0411)  . vancomycin Stopped (01/15/20 1928)     LOS: 4 days    Time spent: 28 minutes    Sharen Hones, MD [facility]   To contact the attending provider between 7A-7P or the covering provider during after hours 7P-7A, please log into the web site [email or web address] and access using universal [place] password for that web site. If you do not have the password, please call the hospital operator.  01/16/2020, 12:10 PM

## 2020-01-16 NOTE — Progress Notes (Signed)
Disregard earlier note. Patient is not tachycardic nor does he have a fever - VS were inaccurate.

## 2020-01-16 NOTE — OR Nursing (Signed)
Dr Lucky Cowboy aware of multifocal frequestnt runs of VTACh, and pt axillary temp of 101.8. He said given NS not LR for this procedure.

## 2020-01-16 NOTE — Interval H&P Note (Signed)
History and Physical Interval Note:  01/16/2020 2:32 PM  Jason Montes  has presented today for surgery, with the diagnosis of Atherosclerotic Disease With Ulceration.  The various methods of treatment have been discussed with the patient and family. After consideration of risks, benefits and other options for treatment, the patient has consented to  Procedure(s): Lower Extremity Angiography (Left) as a surgical intervention.  The patient's history has been reviewed, patient examined, no change in status, stable for surgery.  I have reviewed the patient's chart and labs.  Questions were answered to the patient's satisfaction.     Jason Montes

## 2020-01-16 NOTE — Progress Notes (Signed)
Inpatient Diabetes Program Recommendations  AACE/ADA: New Consensus Statement on Inpatient Glycemic Control (2015)  Target Ranges:  Prepandial:   less than 140 mg/dL      Peak postprandial:   less than 180 mg/dL (1-2 hours)      Critically ill patients:  140 - 180 mg/dL   Results for Jason Montes, Jason Montes (MRN [id number]) as of 01/16/2020 10:08  Ref. Range 01/15/2020 07:54 01/15/2020 11:48 01/15/2020 16:36 01/15/2020 21:23  Glucose-Capillary Latest Ref Range: 70 - 99 mg/dL 313 (H)  7 units NOVOLOG  284 (H)  5 units NOVOLOG +  8 units LANTUS  238 (H)  3 units NOVOLOG  236 (H)  2 units NOVOLOG    Results for Jason Montes, Jason Montes (MRN [id number]) as of 01/16/2020 10:08  Ref. Range 01/16/2020 07:45  Glucose-Capillary Latest Ref Range: 70 - 99 mg/dL 254 (H)  5 units NOVOLOG     Admit with: Overdosed tramadol, Flexeril and gabapentin  History: DM, CVA, CKD, CHF  Home DM Meds: Jardiance 12.5 mg Daily       Metformin 500 mg BID       Ozempic 1 mg Qweek         Current Orders: Lantus 8 units Daily      Novolog Sensitive Correction Scale/ SSI (0-9 units) TID AC + HS     Underwent Left hallux amputation on 8/13.    MD- Note Lantus added yesterday AM.  CBG remains elevated this AM--254.  Please consider increasing Lantus to 15 units Daily (0.2 units/kg)  If dose already given this AM, please consider ordering Lantus 7 units X 1 dose to be given this AM as well to total 15 units for the day    --Will follow patient during hospitalization--  Wyn Quaker RN, MSN, CDE Diabetes Coordinator Inpatient Glycemic Control Team Team Pager: [phone] (8a-5p)

## 2020-01-16 NOTE — Progress Notes (Signed)
Paged Hospitalist re: tachycardic and low grade fever - implementing MEWS protocol

## 2020-01-17 ENCOUNTER — Inpatient Hospital Stay: Payer: Medicare Other

## 2020-01-17 ENCOUNTER — Encounter: Payer: Self-pay | Admitting: Vascular Surgery

## 2020-01-17 LAB — CBC WITH DIFFERENTIAL/PLATELET
Abs Immature Granulocytes: 0.07 10*3/uL (ref 0.00–0.07)
Basophils Absolute: 0 10*3/uL (ref 0.0–0.1)
Basophils Relative: 0 %
Eosinophils Absolute: 0 10*3/uL (ref 0.0–0.5)
Eosinophils Relative: 0 %
HCT: 36.2 % — ABNORMAL LOW (ref 39.0–52.0)
Hemoglobin: 11.6 g/dL — ABNORMAL LOW (ref 13.0–17.0)
Immature Granulocytes: 1 %
Lymphocytes Relative: 5 %
Lymphs Abs: 0.4 10*3/uL — ABNORMAL LOW (ref 0.7–4.0)
MCH: 28.7 pg (ref 26.0–34.0)
MCHC: 32 g/dL (ref 30.0–36.0)
MCV: 89.6 fL (ref 80.0–100.0)
Monocytes Absolute: 0.9 10*3/uL (ref 0.1–1.0)
Monocytes Relative: 10 %
Neutro Abs: 7.3 10*3/uL (ref 1.7–7.7)
Neutrophils Relative %: 84 %
Platelets: 144 10*3/uL — ABNORMAL LOW (ref 150–400)
RBC: 4.04 MIL/uL — ABNORMAL LOW (ref 4.22–5.81)
RDW: 15.9 % — ABNORMAL HIGH (ref 11.5–15.5)
WBC: 9.7 10*3/uL (ref 4.0–10.5)
nRBC: 0 % (ref 0.0–0.2)

## 2020-01-17 LAB — BASIC METABOLIC PANEL
Anion gap: 10 (ref 5–15)
BUN: 73 mg/dL — ABNORMAL HIGH (ref 8–23)
CO2: 21 mmol/L — ABNORMAL LOW (ref 22–32)
Calcium: 9.1 mg/dL (ref 8.9–10.3)
Chloride: 119 mmol/L — ABNORMAL HIGH (ref 98–111)
Creatinine, Ser: 1.75 mg/dL — ABNORMAL HIGH (ref 0.61–1.24)
GFR calc Af Amer: 46 mL/min — ABNORMAL LOW (ref >60)
GFR calc non Af Amer: 39 mL/min — ABNORMAL LOW (ref >60)
Glucose, Bld: 213 mg/dL — ABNORMAL HIGH (ref 70–99)
Potassium: 3.7 mmol/L (ref 3.5–5.1)
Sodium: 150 mmol/L — ABNORMAL HIGH (ref 135–145)

## 2020-01-17 LAB — GLUCOSE, CAPILLARY
Glucose-Capillary: 175 mg/dL — ABNORMAL HIGH (ref 70–99)
Glucose-Capillary: 177 mg/dL — ABNORMAL HIGH (ref 70–99)
Glucose-Capillary: 179 mg/dL — ABNORMAL HIGH (ref 70–99)
Glucose-Capillary: 187 mg/dL — ABNORMAL HIGH (ref 70–99)
Glucose-Capillary: 195 mg/dL — ABNORMAL HIGH (ref 70–99)

## 2020-01-17 LAB — BASIC METABOLIC PANEL WITH GFR
Anion gap: 11 (ref 5–15)
BUN: 62 mg/dL — ABNORMAL HIGH (ref 8–23)
CO2: 21 mmol/L — ABNORMAL LOW (ref 22–32)
Calcium: 9.2 mg/dL (ref 8.9–10.3)
Chloride: 118 mmol/L — ABNORMAL HIGH (ref 98–111)
Creatinine, Ser: 1.73 mg/dL — ABNORMAL HIGH (ref 0.61–1.24)
GFR calc Af Amer: 46 mL/min — ABNORMAL LOW
GFR calc non Af Amer: 40 mL/min — ABNORMAL LOW
Glucose, Bld: 220 mg/dL — ABNORMAL HIGH (ref 70–99)
Potassium: 3.4 mmol/L — ABNORMAL LOW (ref 3.5–5.1)
Sodium: 150 mmol/L — ABNORMAL HIGH (ref 135–145)

## 2020-01-17 LAB — SURGICAL PATHOLOGY

## 2020-01-17 LAB — BRAIN NATRIURETIC PEPTIDE: B Natriuretic Peptide: 1211.9 pg/mL — ABNORMAL HIGH (ref 0.0–100.0)

## 2020-01-17 LAB — MAGNESIUM: Magnesium: 2.9 mg/dL — ABNORMAL HIGH (ref 1.7–2.4)

## 2020-01-17 MED ORDER — AMIODARONE LOAD VIA INFUSION: 150.0000 mg | Freq: Once | INTRAVENOUS | Status: DC

## 2020-01-17 MED ORDER — CLOPIDOGREL BISULFATE 75 MG PO TABS
75.0000 mg | ORAL_TABLET | Freq: Every day | ORAL | Status: DC
  Administered 2020-01-17 – 2020-02-03 (×18): 75 mg via ORAL
  Filled 2020-01-17 (×21): qty 1

## 2020-01-17 MED ORDER — AMIODARONE HCL 200 MG PO TABS
400.0000 mg | ORAL_TABLET | Freq: Two times a day (BID) | ORAL | Status: DC
  Administered 2020-01-17 – 2020-01-26 (×17): 400 mg via ORAL
  Filled 2020-01-17 (×18): qty 2

## 2020-01-17 MED ORDER — METRONIDAZOLE 500 MG PO TABS
500.0000 mg | ORAL_TABLET | Freq: Three times a day (TID) | ORAL | Status: DC
  Administered 2020-01-17 – 2020-01-20 (×10): 500 mg via ORAL
  Filled 2020-01-17 (×14): qty 1

## 2020-01-17 MED ORDER — APIXABAN 5 MG PO TABS
5.0000 mg | ORAL_TABLET | Freq: Two times a day (BID) | ORAL | Status: DC
  Administered 2020-01-17 – 2020-02-03 (×35): 5 mg via ORAL
  Filled 2020-01-17 (×36): qty 1

## 2020-01-17 MED ORDER — SODIUM CHLORIDE 0.45 % IV SOLN: INTRAVENOUS | Status: DC

## 2020-01-17 MED ORDER — AMIODARONE HCL IN DEXTROSE 360-4.14 MG/200ML-% IV SOLN: 30.0000 mg/h | INTRAVENOUS | Status: DC

## 2020-01-17 MED ORDER — AMIODARONE HCL IN DEXTROSE 360-4.14 MG/200ML-% IV SOLN: 60.0000 mg/h | INTRAVENOUS | Status: DC

## 2020-01-17 MED ORDER — SODIUM CHLORIDE 0.9 % IV SOLN
2.0000 g | Freq: Two times a day (BID) | INTRAVENOUS | Status: DC
  Administered 2020-01-17 – 2020-01-20 (×8): 2 g via INTRAVENOUS
  Filled 2020-01-17 (×11): qty 2

## 2020-01-17 MED ORDER — INSULIN ASPART 100 UNIT/ML ~~LOC~~ SOLN
0.0000 [IU] | SUBCUTANEOUS | Status: DC
  Administered 2020-01-17 (×2): 3 [IU] via SUBCUTANEOUS
  Administered 2020-01-18: 11 [IU] via SUBCUTANEOUS
  Administered 2020-01-18 (×2): 3 [IU] via SUBCUTANEOUS
  Administered 2020-01-18 (×2): 5 [IU] via SUBCUTANEOUS
  Administered 2020-01-19 (×2): 3 [IU] via SUBCUTANEOUS
  Administered 2020-01-19: 11 [IU] via SUBCUTANEOUS
  Administered 2020-01-19: 3 [IU] via SUBCUTANEOUS
  Administered 2020-01-19 (×2): 5 [IU] via SUBCUTANEOUS
  Administered 2020-01-20 (×2): 2 [IU] via SUBCUTANEOUS
  Administered 2020-01-20: 3 [IU] via SUBCUTANEOUS
  Administered 2020-01-20: 5 [IU] via SUBCUTANEOUS
  Administered 2020-01-20: 2 [IU] via SUBCUTANEOUS
  Administered 2020-01-21 (×2): 3 [IU] via SUBCUTANEOUS
  Administered 2020-01-22 (×2): 2 [IU] via SUBCUTANEOUS
  Filled 2020-01-17 (×23): qty 1

## 2020-01-17 MED ORDER — CARVEDILOL 6.25 MG PO TABS
6.2500 mg | ORAL_TABLET | Freq: Two times a day (BID) | ORAL | Status: DC
  Administered 2020-01-17 – 2020-01-18 (×2): 6.25 mg via ORAL
  Filled 2020-01-17 (×2): qty 1

## 2020-01-17 MED ORDER — VANCOMYCIN HCL IN DEXTROSE 1-5 GM/200ML-% IV SOLN
1000.0000 mg | INTRAVENOUS | Status: DC
  Administered 2020-01-17 – 2020-01-19 (×3): 1000 mg via INTRAVENOUS
  Filled 2020-01-17 (×4): qty 200

## 2020-01-17 MED ORDER — ENOXAPARIN SODIUM 40 MG/0.4ML ~~LOC~~ SOLN: 40.0000 mg | SUBCUTANEOUS | Status: DC

## 2020-01-17 NOTE — TOC Initial Note (Signed)
Transition of Care [facility]) - Initial/Assessment Note    Patient Details  Name: Jason Montes MRN: [id number] Date of Birth: [birth year]  Transition of Care [facility]) CM/SW Contact:    Beverly Sessions, RN Phone Number: 01/17/2020, 1:07 PM  Clinical Narrative:                  Patient admitted with encephalopathy.  Questionable overdose.  Per MD psych will be re consulted  Patient lives at [place], [place]   Patient open with [place] for RN and [place] with [facility] aware of admission.   Plan is for patient to return at [facility]  Patient has RW, WC, BSC.  PCS services 2.5 hours a day, and meals on wheels.   S/p left great toe removal.  PT eval pending  Barrier: febrile while on antibiotics        Patient Goals and CMS Choice        Expected Discharge Plan and Services                                                Prior Living Arrangements/Services                       Activities of Daily Living      Permission Sought/Granted                  Emotional Assessment              Admission diagnosis:  Polypharmacy [Z79.899] Subacute osteomyelitis of left foot ([place]) [D98.338] Acute metabolic encephalopathy [S50.53] Chronic kidney disease, unspecified CKD stage [N18.9] Patient Active Problem List   Diagnosis Date Noted  . Acute metabolic encephalopathy 97/67/3419  . Osteomyelitis of great toe of left foot ([place]) 01/11/2020  . Type II diabetes mellitus with renal manifestations ([place]) 01/11/2020  . Stroke ([place]) 01/11/2020  . Depression 01/11/2020  . Suicidal behavior 01/11/2020  . Subacute osteomyelitis of left foot ([place])   . Intertrochanteric fracture of right femur, closed, initial encounter ([place]) 11/15/2019  . Accidental fall 11/15/2019  . Preoperative clearance 11/15/2019  . Closed right hip fracture, initial encounter ([place]) 11/15/2019  . Diabetic foot infection ([employer])    . Stage 3a chronic kidney disease   . Cellulitis of left foot 10/07/2019  . Malnutrition of moderate degree 03/13/2019  . Acute renal failure superimposed on stage 3a chronic kidney disease ([place]) 03/11/2019  . Family history of malignant neoplasm of gastrointestinal tract   . Benign neoplasm of transverse colon   . Benign neoplasm of cecum   . Benign neoplasm of ascending colon   . Benign neoplasm of descending colon   . Polyp of sigmoid colon   . Atrial fibrillation ([place]) 02/25/2018  . Vitamin B12 deficiency anemia 02/25/2018  . CKD (chronic kidney disease) stage 2, GFR 60-89 ml/min 02/25/2018  . CAD (coronary artery disease) 02/25/2018  . DM (diabetes mellitus), type 2, uncontrolled with complications ([place]) 37/90/2409  . GERD with esophagitis 02/25/2018  . Generalized muscle weakness 02/25/2018  . Chronic systolic CHF (congestive heart failure) ([place]) 02/25/2018  . Chronic anticoagulation 02/25/2018  . 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 Hyperlipidemia 02/25/2018  . Essential hypertension 02/25/2018  . Hypokalemia 02/25/2018  . Mild neurocognitive disorder 02/25/2018  . Osteoarthritis  02/25/2018  . PVD (peripheral vascular disease) ([place]) 02/25/2018  . Presbyopia 02/25/2018  . Restless leg syndrome 02/25/2018  . History of stroke with current residual effects 02/25/2018  . Subacute osteomyelitis of right foot ([place]) 02/25/2018  . Tinea unguium 02/25/2018   PCP:  Clinic, [place]:   [place], [place] - [address] [phone] [place] [address] Phone: [phone] Fax: [phone]  [id number] [place], [place] - [address] [place] [address] Phone: [phone] Fax: [phone]     Social Determinants of Health (SDOH) Interventions    Readmission Risk Interventions Readmission Risk Prevention Plan 01/17/2020  Medication Review (RN Care  Manager) Complete  HRI or Home Care Consult Complete  Palliative Care Screening Not Applicable  Some recent data might be hidden

## 2020-01-17 NOTE — Consult Note (Signed)
ANTICOAGULATION CONSULT NOTE  Pharmacy Consult for apixaban Indication: atrial fibrillation  Patient Measurements: Weight: 73.4 kg (161 lb 13.1 oz)   Vital Signs: BP: 119/83 (08/17 0426) Pulse Rate: 108 (08/17 0426)  Labs: Recent Labs    01/15/20 0718 01/15/20 0718 01/16/20 0502 01/17/20 0513  HGB 11.3*   < > 13.3 11.6*  HCT 34.4*  --  42.5 36.2*  PLT 140*  --  PLATELET CLUMPING, SUGGEST RECOLLECTION OF SAMPLE IN CITRATE TUBE. 144*  CREATININE 2.59*  --  2.48* 1.75*   < > = values in this interval not displayed.    Estimated Creatinine Clearance: 41 mL/min (A) (by C-G formula based on SCr of 1.75 mg/dL (H)).   Medical History: Past Medical History:  Diagnosis Date  . Allergy   . Atrial fibrillation ([place])   . B12 deficiency   . Chronic kidney disease   . Chronic kidney disease (CKD), stage II (mild)   . Coronary atherosclerosis   . Diabetes mellitus without complication ([place])   . Elevated PSA   . Esophagitis   . GERD (gastroesophageal reflux disease)   . Heart attack ([place])   . Heart disease   . Hyperlipidemia   . Hypertension   . Hypokalemia   . Mild neurocognitive disorder   . Osteoarthritis   . Presbyopia   . PVD (peripheral vascular disease) ([place])   . Restless leg syndrome   . Stroke (cerebrum) ([place])   . Stroke ([place])   . Subacute osteomyelitis ([place])   . Tinea unguium   . Uncompensated short term memory deficit     Medications:  Scheduled:  . aspirin EC  81 mg Oral Daily  . atorvastatin  40 mg Oral QHS  . carvedilol  3.125 mg Oral BID WC  . Chlorhexidine Gluconate Cloth  6 each Topical Daily  . cholecalciferol  1,000 Units Oral Daily  . clopidogrel  75 mg Oral Daily  . diphenhydrAMINE  50 mg Oral QHS  . famotidine  20 mg Oral Daily  . fluticasone  2 spray Each Nare Daily  . gabapentin  200 mg Oral BID  . haloperidol  0.5 mg Oral BID  . insulin aspart  0-5 Units Subcutaneous QHS  . insulin aspart  0-9 Units Subcutaneous TID WC  . insulin  glargine  12 Units Subcutaneous Daily  . metroNIDAZOLE  500 mg Oral Q8H  . OLANZapine zydis  10 mg Oral QHS  . senna-docusate  2 tablet Oral BID  . tamsulosin  0.4 mg Oral Daily    Assessment: 27 year oldmalewith medical history significant ofhypertension, hyperlipidemia, diabetes mellitus, stroke, GERD, depression, RLS, PVD, CAD, CKD-3, atrial fibrillation on Eliquis PTA, chronic left greater toe osteomyelitis,sCHF with EF of 40-45% presented with AMS, drug overdose, osteomyelitis s/p POD #1 s/p left lower extremity angiogram with tibial intervention. H&H, platelets lower than baseline but stable.   Goal of Therapy:  Monitor platelets by anticoagulation protocol: Yes   Plan:   Stop enoxaparin: last dose 08/16 2118  Restart apixaban 5 mg po BID  This is the same dose as prior to admission  CBC at least every 3 days per protocol  Dallie Piles 01/17/2020,1:08 PM

## 2020-01-17 NOTE — Progress Notes (Signed)
At 15:36 pt had 20 beats of V-tach again. Attending MD was made aware.

## 2020-01-17 NOTE — Progress Notes (Signed)
[facility] Cardiology    SUBJECTIVE: Altered mental status unable to respond to questioning   Vitals:   01/17/20 0022 01/17/20 0426 01/17/20 0500 01/17/20 1336  BP: 132/73 119/83  97/61  Pulse: 100 (!) 108  (!) 59  Resp: 18 17    Temp:      TempSrc:      SpO2: 95% 95%    Weight:   73.4 kg      Intake/Output Summary (Last 24 hours) at 01/17/2020 1653 Last data filed at 01/17/2020 1046 Gross per 24 hour  Intake 1813.75 ml  Output 800 ml  Net 1013.75 ml      PHYSICAL EXAM  General: Confused altered not responsive to questions HEENT:  Normocephalic and atramatic Neck:  No JVD.  Lungs: Clear bilaterally to auscultation and percussion. Heart: HRRR . Normal S1 and S2 without gallops or murmurs.  Abdomen: Bowel sounds are positive, abdomen soft and non-tender  Msk:  Back normal, normal gait. Normal strength and tone for age. Extremities: No clubbing, cyanosis or edema.  Amputation left foot multiple infected left toes Neuro: Alert and oriented X 3. Psych:  Good affect, responds appropriately   LABS: Basic Metabolic Panel: Recent Labs    01/16/20 0502 01/17/20 0513  NA 145 150*  K 5.1 3.7  CL 109 119*  CO2 19* 21*  GLUCOSE 234* 213*  BUN 87* 73*  CREATININE 2.48* 1.75*  CALCIUM 9.8 9.1  MG 2.9* 2.9*   Liver Function Tests: No results for input(s): AST, ALT, ALKPHOS, BILITOT, PROT, ALBUMIN in the last 72 hours. No results for input(s): LIPASE, AMYLASE in the last 72 hours. CBC: Recent Labs    01/16/20 0502 01/17/20 0513  WBC 10.5 9.7  NEUTROABS 9.3* 7.3  HGB 13.3 11.6*  HCT 42.5 36.2*  MCV 92.0 89.6  PLT PLATELET CLUMPING, SUGGEST RECOLLECTION OF SAMPLE IN CITRATE TUBE. 144*   Cardiac Enzymes: No results for input(s): CKTOTAL, CKMB, CKMBINDEX, TROPONINI in the last 72 hours. BNP: Invalid input(s): POCBNP D-Dimer: No results for input(s): DDIMER in the last 72 hours. Hemoglobin A1C: No results for input(s): HGBA1C in the last 72 hours. Fasting Lipid  Panel: No results for input(s): CHOL, HDL, LDLCALC, TRIG, CHOLHDL, LDLDIRECT in the last 72 hours. Thyroid Function Tests: No results for input(s): TSH, T4TOTAL, T3FREE, THYROIDAB in the last 72 hours.  Invalid input(s): FREET3 Anemia Panel: No results for input(s): VITAMINB12, FOLATE, FERRITIN, TIBC, IRON, RETICCTPCT in the last 72 hours.  DG Chest 2 View  Result Date: 01/17/2020 CLINICAL DATA:  Pneumonia EXAM: CHEST - 2 VIEW COMPARISON:  01/11/2020 FINDINGS: Normal heart size and mediastinal contours. Single chamber pacer lead into the right ventricle. 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 Airspace opacity at the lung bases with possible small left pleural effusion. IMPRESSION: Bilateral lower lobe pneumonia. Electronically Signed   By: Monte Fantasia M.D.   On: 01/17/2020 08:28   PERIPHERAL VASCULAR CATHETERIZATION  Result Date: 01/16/2020 See op note    Echo reduced left ventricular function EF around 40%  TELEMETRY: Episodes of nonsustained VT  ASSESSMENT AND PLAN:  Principal Problem:   Acute metabolic encephalopathy Active Problems:   Atrial fibrillation (HCC)   CAD (coronary artery disease)   GERD with esophagitis   Chronic systolic CHF (congestive heart failure) (HCC)   Hyperlipidemia   Essential hypertension   Acute renal failure superimposed on stage 3a chronic kidney disease (HCC)   Stage 3a chronic kidney disease   Osteomyelitis of great toe of left foot ([place])  Type II diabetes mellitus with renal manifestations ([place])   Stroke ([employer])   Depression   Suicidal behavior    Plan 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

## 2020-01-17 NOTE — Progress Notes (Addendum)
Pt has had 12 beats v-tach episode around 13:35 .Attending MD was notified.

## 2020-01-17 NOTE — Evaluation (Signed)
Physical Therapy Evaluation Patient Details Name: Jason Montes MRN: [id number] DOB: 1950/11/19 Today's Date: 01/17/2020   History of Present Illness  Jason Montes is a 69yoM who comes [facility]11 c AMS, overdose. Patientseen by podiatry as he was scheduled for procedure for partial left first ray amputation on 8/13.  Patient did not have cardiology clearance and needed it prior to surgery. Pt underwent Lt hallux amputation on 8/13, revascularization procedureon 8/16. PMH: HTN, HLD, DM, CVA, GERD, depression, RLS, PVD, CAD, CKD-3, AF on Eliquis, chronic Lt hallux osteomyelitis, sCHF with EF of 40-45%  Clinical Impression  Pt admitted with above diagnosis. Pt currently with functional limitations due to the deficits listed below (see "PT Problem List"). Upon entry, pt in bed, asleep, sitter at bedside. Pt difficult to rouse, remains groggy throughout. Pt answers some basic questions confirming prior RW use and footwear needs for RLE s/p trans-met amputation, but is difficult to understand due to heavy dysarthria. Pt attempts to follows commands, but is weak, has delayed initiation and coordination, ultimately requires maxA for bed mobility and min-modA for trunk support whilst seated, safe for 1-2 minutes where he is able to sit unsupported prior to fatigue and collapse posteriorly. Pt is not safe to attempt transfer at this time. Functional mobility assessment demonstrates increased effort/time requirements, poor tolerance, and need for physical assistance, whereas the patient performed these at a higher level of independence PTA. Pt will benefit from skilled PT intervention to increase independence and safety with basic mobility in preparation for discharge to the venue listed below. Pt really warrants short term rehab level services at [place] but eligibility is unlikely given his record as a registered sex offender.      Follow Up Recommendations Home health PT;Supervision/Assistance - 24  hour;Supervision for mobility/OOB    Equipment Recommendations  None recommended by PT;Other (comment) (still needs an orthowedge postopshoe)    Recommendations for Other Services       Precautions / Restrictions Precautions Precautions: Fall Precaution Comments: Suicide precautions Required Braces or Orthoses: Other Brace Other Brace: postop shoe Restrictions Weight Bearing Restrictions: Yes LLE Weight Bearing: Weight bearing as tolerated (heel weight bearing in postop shoe (per Dr. Cleda Montes via chat))      Mobility  Bed Mobility Overal bed mobility: Needs Assistance Bed Mobility: Supine to Sit;Sit to Supine     Supine to sit: Max assist Sit to supine: Max assist   General bed mobility comments: very weak, poor capacity to use BUE for self-assist; struggles to sit independently >2 minutes once unsupported.  Transfers Overall transfer level:  (unsafe to attempt.)                  Ambulation/Gait                Stairs            Wheelchair Mobility    Modified Rankin (Stroke Patients Only)       Balance Overall balance assessment: Needs assistance Sitting-balance support: Bilateral upper extremity supported;Feet unsupported Sitting balance-Leahy Scale: Poor Sitting balance - Comments: struggles to obtain posture neutral                                     Pertinent Vitals/Pain Pain Assessment: No/denies pain    Home Living Family/patient expects to be discharged to::  (unable to obtain due to AMS; plan is for DC to boarding house)  Home Equipment: Gilford Rile - 2 wheels      Prior Function                 Hand Dominance        Extremity/Trunk Assessment   Upper Extremity Assessment Upper Extremity Assessment: Generalized weakness    Lower Extremity Assessment Lower Extremity Assessment: Generalized weakness       Communication      Cognition Arousal/Alertness: Lethargic Behavior  During Therapy: Impulsive Overall Cognitive Status: Impaired/Different from baseline                                        General Comments      Exercises     Assessment/Plan    PT Assessment Patient needs continued PT services  PT Problem List Decreased strength;Decreased range of motion;Decreased activity tolerance;Decreased balance;Decreased mobility       PT Treatment Interventions DME instruction;Gait training;Balance training;Stair training;Functional mobility training;Therapeutic activities;Patient/family education;Therapeutic exercise;Neuromuscular re-education    PT Goals (Current goals can be found in the Care Plan section)  Acute Rehab PT Goals PT Goal Formulation: Patient unable to participate in goal setting Time For Goal Achievement: 01/31/20 Potential to Achieve Goals: Fair    Frequency 7X/week   Barriers to discharge Decreased caregiver support      Co-evaluation               AM-PAC PT "6 Clicks" Mobility  Outcome Measure Help needed turning from your back to your side while in a flat bed without using bedrails?: Total Help needed moving from lying on your back to sitting on the side of a flat bed without using bedrails?: Total Help needed moving to and from a bed to a chair (including a wheelchair)?: Total Help needed standing up from a chair using your arms (e.g., wheelchair or bedside chair)?: Total Help needed to walk in hospital room?: Total Help needed climbing 3-5 steps with a railing? : Total 6 Click Score: 6    End of Session   Activity Tolerance: Treatment limited secondary to medical complications (Comment) Patient left: in bed;with nursing/sitter in room;with call bell/phone within reach Nurse Communication: Mobility status PT Visit Diagnosis: Difficulty in walking, not elsewhere classified (R26.2);Unsteadiness on feet (R26.81);Other abnormalities of gait and mobility (R26.89);Muscle weakness (generalized)  (M62.81);Other symptoms and signs involving the nervous system (R29.898)    Time: 3154-0086 PT Time Calculation (min) (ACUTE ONLY): 12 min   Charges:   PT Evaluation $PT Eval High Complexity: 1 High          4:12 PM, 01/17/20 Etta Grandchild, PT, DPT Physical Therapist - [facility]  [phone] ([place])    [place] C 01/17/2020, 4:08 PM

## 2020-01-17 NOTE — Progress Notes (Signed)
[facility]  Daily Progress Note   Subjective  -   Patient very somnolent. Received Ativan earlier this morning for severe agitation. Mumbles incoherently and does not answer questions.  Objective Vitals:   01/16/20 1957 01/17/20 0022 01/17/20 0426 01/17/20 0500  BP: (!) 149/127 132/73 119/83   Pulse: 95 100 (!) 108   Resp: 18 18 17    Temp: 98.5 F (36.9 C)     TempSrc:      SpO2: 95% 95% 95%   Weight:    73.4 kg    Intake/Output Summary (Last 24 hours) at 01/17/2020 0829 Last data filed at 01/17/2020 0437 Gross per 24 hour  Intake 1314.22 ml  Output 1300 ml  Net 14.22 ml    PULM  CTAB CV  RRR VASC  access site is clean, dry, and intact. Foot is warm with good capillary refill. Dressing remains in place on the left foot.  Laboratory CBC    Component Value Date/Time   WBC 9.7 01/17/2020 0513   HGB 11.6 (L) 01/17/2020 0513   HCT 36.2 (L) 01/17/2020 0513   PLT 144 (L) 01/17/2020 0513    BMET    Component Value Date/Time   NA 150 (H) 01/17/2020 0513   K 3.7 01/17/2020 0513   CL 119 (H) 01/17/2020 0513   CO2 21 (L) 01/17/2020 0513   GLUCOSE 213 (H) 01/17/2020 0513   BUN 73 (H) 01/17/2020 0513   CREATININE 1.75 (H) 01/17/2020 0513   CALCIUM 9.1 01/17/2020 0513   GFRNONAA 39 (L) 01/17/2020 0513   GFRAA 46 (L) 01/17/2020 0513    Assessment/Planning: POD #1 s/p left lower extremity angiogram with tibial intervention yesterday   PAD can come off of access site this afternoon  Foot appears well perfused.  We'll add Plavix to his medical regimen after tibial intervention. Already on aspirin and a statin agent  Wound care as per podiatry  No further vascular recommendations at this time.  Can follow-up in our office in 3 to 4 weeks with ABIs.    Leotis Pain  01/17/2020, 8:29 AM

## 2020-01-17 NOTE — Consult Note (Signed)
Pharmacy Antibiotic Note  Jason Montes is a 69 y.o. male admitted on 01/11/2020 with osteomyelitis of great left toe s/p amputation.  Patient with PCT of 4.04. Pharmacy has been consulted for vancomycin dosing. He is also receiving cefepime. This is day # 4 of broad-spectrum antibiotics, Since admission his renal function has been worse than his baseline level but as of today has improved.  Vancomycin Plan:   Adjust vancomycin dose to 1000 mg IV q24h  T1/2: 18.1h, Ke 0.038  Css (calculated): 31.6 / 13.1 mcg/mL  Daily SCr while on vancomycin to assess renal function  Vancomycin levels as clinically indicated  Weight: 73.4 kg (161 lb 13.1 oz)  Temp (24hrs), Avg:99.3 F (37.4 C), Min:98.4 F (36.9 C), Max:101.1 F (38.4 C)  Recent Labs  Lab 01/11/20 0656 01/11/20 0656 01/11/20 0739 01/11/20 1235 01/12/20 0457 01/14/20 0945 01/15/20 0718 01/16/20 0502 01/17/20 0513  WBC 6.7  --   --   --  6.7  --  11.2* 10.5 9.7  CREATININE 1.59*   < >  --   --  1.32* 2.44* 2.59* 2.48* 1.75*  LATICACIDVEN  --   --  1.2 1.0  --   --   --   --   --    < > = values in this interval not displayed.    Estimated Creatinine Clearance: 41 mL/min (A) (by C-G formula based on SCr of 1.75 mg/dL (H)).    Allergies  Allergen Reactions  . Amlodipine Other (See Comments)  . Norvasc [Amlodipine Besylate]     Unknown  . Penicillins     Childhood allergy, not sure what happens     Antimicrobials this admission: meropenem 8/14 >> 8/17 cefepime 8/17 >> vancomycin 8/14 >>   Microbiology results: 8/11 BCx: NG final 8/17 BCx: NG<24h 8/16 MRSA PCR: negative 8/11 SARS CoV-2: negative  Thank you for allowing pharmacy to be a part of this patient's care.  Dallie Piles, PharmD 01/17/2020 10:03 AM

## 2020-01-17 NOTE — Evaluation (Signed)
Clinical/Bedside Swallow Evaluation Patient Details  Name: Jason Montes MRN: [id number] Date of Birth: 21-Sep-1950  Today's Date: 01/17/2020 Time: SLP Start Time (ACUTE ONLY): 62 SLP Stop Time (ACUTE ONLY): 9 SLP Time Calculation (min) (ACUTE ONLY): 15 min  Past Medical History:  Past Medical History:  Diagnosis Date   Allergy    Atrial fibrillation ([place])    B12 deficiency    Chronic kidney disease    Chronic kidney disease (CKD), stage II (mild)    Coronary atherosclerosis    Diabetes mellitus without complication (HCC)    Elevated PSA    Esophagitis    GERD (gastroesophageal reflux disease)    Heart attack ([place])    Heart disease    Hyperlipidemia    Hypertension    Hypokalemia    Mild neurocognitive disorder    Osteoarthritis    Presbyopia    PVD (peripheral vascular disease) ([place])    Restless leg syndrome    Stroke (cerebrum) (HCC)    Stroke (HCC)    Subacute osteomyelitis (HCC)    Tinea unguium    Uncompensated short term memory deficit    Past Surgical History:  Past Surgical History:  Procedure Laterality Date   AMPUTATION TOE Left 01/13/2020   Procedure: AMPUTATION RAY LEFT 1ST;  Surgeon: Caroline More, DPM;  Location: [facility] ORS;  Service: Podiatry;  Laterality: Left;   CARDIAC PACEMAKER PLACEMENT     COLONOSCOPY WITH PROPOFOL N/A 05/18/2018   Procedure: COLONOSCOPY WITH PROPOFOL;  Surgeon: [name], MD;  Location: [facility];  Service: Endoscopy;  Laterality: N/A;   CORONARY ARTERY BYPASS GRAFT     FOOT AMPUTATION Right    INTRAMEDULLARY (IM) NAIL INTERTROCHANTERIC Right 11/16/2019   Procedure: INTRAMEDULLARY (IM) NAIL INTERTROCHANTRIC;  Surgeon: Thornton Park, MD;  Location: [facility] ORS;  Service: Orthopedics;  Laterality: Right;   LOWER EXTREMITY ANGIOGRAPHY Left 10/10/2019   Procedure: Lower Extremity Angiography;  Surgeon: Algernon Huxley, MD;  Location: [place] [facility];  Service: Cardiovascular;   Laterality: Left;   LOWER EXTREMITY ANGIOGRAPHY Left 01/16/2020   Procedure: Lower Extremity Angiography;  Surgeon: Algernon Huxley, MD;  Location: [place] [facility];  Service: Cardiovascular;  Laterality: Left;   HPI:  Jason Montes is a 69 y.o. male with medical history significant of hypertension, hyperlipidemia, diabetes mellitus, stroke, GERD, depression, RLS, PVD, CAD, CKD-3, atrial fibrillation on Eliquis, chronic left greater toe osteomyelitis, sCHF with EF of 40-45%, who presents with altered mental status and overdose ?unintentional. Most recent chest x-ray (01/17/2020) revealed Bilateral lower lobe pneumonia. ST consulted to DD oropharyngeal dysphagia.    Assessment / Plan / Recommendation Clinical Impression  Pt has a host of aspiration risks including lethargy, altered mental status (deficits in sustained attention, inability to follow 1 step directions), and restlessness.  Despite these comorbidities, pt consumed 3 oz thin liquids via straw with no overt s/s of aspiration. With a lot of coaching he consumed 2 bites of regular solid graham cracker. His oral phase appeared appropriate with no oral residue post swallow. At this time recommend current diet with STRICT ASPIRATION PRECAUTIONS - no distractions, sitting upright, no talking. ST intervention is not indicated at this time.  SLP Visit Diagnosis: Dysphagia, unspecified (R13.10)    Aspiration Risk  Mild aspiration risk;Moderate aspiration risk (d/t AMS)    Diet Recommendation   Regular diet with thin liquids, may use straw  Medication Administration: Whole meds with liquid    Other  Recommendations Oral Care Recommendations: Oral care BID  Follow up Recommendations None        Swallow Study   General Date of Onset: 01/12/20 HPI: Jason Montes is a 69 y.o. male with medical history significant of hypertension, hyperlipidemia, diabetes mellitus, stroke, GERD, depression, RLS, PVD, CAD, CKD-3, atrial  fibrillation on Eliquis, chronic left greater toe osteomyelitis, sCHF with EF of 40-45%, who presents with altered mental status and overdose ?unintentional. Most recent chest x-ray (01/17/2020) revealed Bilateral lower lobe pneumonia. ST consulted to DD oropharyngeal dysphagia.  Type of Study: Bedside Swallow Evaluation Previous Swallow Assessment: none in chart Diet Prior to this Study: Regular;Thin liquids Temperature Spikes Noted: No Respiratory Status: Room air History of Recent Intubation: No Behavior/Cognition: Confused;Doesn't follow directions Oral Cavity Assessment: Within Functional Limits Oral Care Completed by SLP: Recent completion by staff Oral Cavity - Dentition: Adequate natural dentition Vision: Impaired for self-feeding Self-Feeding Abilities: Total assist Patient Positioning: Upright in bed Baseline Vocal Quality: Low vocal intensity (unintelligible d/t AMS) Volitional Cough: Cognitively unable to elicit Volitional Swallow: Unable to elicit    Oral/Motor/Sensory Function Overall Oral Motor/Sensory Function:  (grossly intact)   Ice Chips Ice chips: Not tested   Thin Liquid Thin Liquid: Within functional limits Presentation: Straw    Nectar Thick Nectar Thick Liquid: Not tested   Honey Thick Honey Thick Liquid: Not tested   Puree Puree: Not tested   Solid     Solid: Within functional limits     Merwyn Hodapp B. Rutherford Nail M.S., CCC-SLP, [place] [facility] [place] 01/17/2020,4:48 PM

## 2020-01-17 NOTE — Progress Notes (Addendum)
PROGRESS NOTE    Jason Montes  JJH:[id number] DOB: [birth year] DOA: 01/11/2020 PCP: [facility]   Chief complaint.  Altered mental status.  Brief Narrative:  Jason Mcbreen [name] a 69 y.o.malewith medical history significant ofhypertension, hyperlipidemia, diabetes mellitus, stroke, GERD, depression, RLS, PVD, CAD, CKD-3, atrial fibrillation on Eliquis, chronic left greater toe osteomyelitis,sCHF with EF of 40-45%, who presents with altered mental status and overdose ?unintentional.  8/14. Patient still has significant confusion today. Worsening renal function, start IV fluids. Restart lower dose Neurontin.Started antibiotics with vancomycin and meropenem for elevated procalcitonin level 4.04.Foley catheter was anchored for residual over 1370 ml.   8/16.  Patient condition much improved.  Not significant confusion.  Continue antibiotics.  Left foot angiography and angioplasty of left anterior tibial artery and the left peroneal artery by Dr. Lucky Montes.  8/17.  Patient developed a fever again while on antibiotics, chest x-ray showed bilateral lower lobe pneumonia.   Assessment & Plan:   Principal Problem:   Acute metabolic encephalopathy Active Problems:   Atrial fibrillation (HCC)   CAD (coronary artery disease)   GERD with esophagitis   Chronic systolic CHF (congestive heart failure) (HCC)   Hyperlipidemia   Essential hypertension   Acute renal failure superimposed on stage 3a chronic kidney disease (HCC)   Stage 3a chronic kidney disease   Osteomyelitis of great toe of left foot (HCC)   Type II diabetes mellitus with renal manifestations ([place])   Stroke ([place])   Depression   Suicidal behavior  1.  Acute metabolic encephalopathy. Appear to be secondary to acute infection. Condition gradually improving.  He was sleepy, he received some Ativan early this morning.  Discontinue Ativan, continue as needed Haldol. There was some concern about suicide  ideation, patient has been evaluated by psychiatry.  Patient is too sleepy to have thorough evaluation.  Need to consult psychiatry again when patient mental status improves.  #2.  Osteomyelitis of the left left great toe. Started on vancomycin and meropenem, day #4.  Planning to complete 5 days of antibiotics.  Recheck a procalcitonin level tomorrow. Developed a fever again since yesterday, assumed to be secondary to aspiration pneumonia.  Blood culture sent out.  3.  Bilateral lower lobe aspiration pneumonia. Probably secondary to altered mental status.  Do not believe patient has an dysphagia.  Will obtain speech therapy evaluation.  Meropenem should be appropriate antibiotics.  Recheck procalcitonin level tomorrow.  4.  Urinary retention secondary to benign prostate hypertrophy. Patient had significant urinary tension with residual over 1370 mL.  Continue Foley catheter for now.  On Flomax.  Patient may need keep Foley catheter for at least a week due to significant distention of bladder.  5.  Acute kidney injury on chronic kidney disease stage IIIa. Renal function improving.  Continue half-normal saline.  6.  Hypernatremia. Patient received normal saline instead of lactated Ringer's solution during the surgery.  IV fluid changed to half-normal saline today, recheck sodium level tomorrow.  7.  Slight thrombocytopenia. Likely secondary to infection.  Recheck a CBC tomorrow.  8.  Paroxysmal atrial fibrillation. Currently in sinus.  Resume Eliquis.  9.  Chronic systolic congestive heart failure with moderate pulmonary hypertension. No evidence of exacerbation.  Continue gentle rehydration.  10.  Uncontrolled type 2 diabetes with hyperglycemia. Lantus dose increased yesterday.  11.  Nonsustained ventricular tachycardia. From surgical record from nurse note yesterday, patient has multiple rounds of V. tach.  Did not see any strip or EKG.  Will start telemetry  monitoring.  Patient has  chronic systolic congestive heart failure with ejection fraction 40 to 45% and moderate pulmonary hypertension, likely the cause of V. Tach.  Had another episode of 12 beats of V. tach at 1345.  Will obtain cardiology consult.      DVT prophylaxis:Eliquis Code Status:Full Family Communication:Daughter updated. Disposition Plan:  Patient came from:Home  Anticipated d/c place:  Barriers to d/c OR conditions which need to be met to effect a safe d/c:   Consultants:  Podiatry. Psychiatry   Procedures:  Left hallux amputation    Subjective: Patient is pretty sleepy this morning.  Spoke with the nurse, patient has been doing well yesterday.  He was able to eat and talking after surgery yesterday.  He was a little bit confused this morning, received Ativan for agitation. He denies any short of breath or cough. He had a fever recorded since yesterday. No diarrhea or abdominal pain.  No nausea vomiting.  Objective: Vitals:   01/16/20 1957 01/17/20 0022 01/17/20 0426 01/17/20 0500  BP: (!) 149/127 132/73 119/83   Pulse: 95 100 (!) 108   Resp: _0 Temp: 98.5 F (36.9 C)     TempSrc:      SpO2: 95% 95% 95%   Weight:    73.4 kg    Intake/Output Summary (Last 24 hours) at 01/17/2020 0943 Last data filed at 01/17/2020 0437 Gross per 24 hour  Intake 1314.22 ml  Output 1300 ml  Net 14.22 ml   Filed Weights   01/15/20 0500 01/16/20 0500 01/17/20 0500  Weight: 73 kg 72.6 kg 73.4 kg    Examination:  General exam: Appears calm and comfortable  Respiratory system: Clear to auscultation. Respiratory effort normal. Cardiovascular system: S1 & S2 heard, RRR. No JVD, murmurs, rubs, gallops or clicks. No pedal edema. Gastrointestinal system: Abdomen is nondistended, soft and nontender. No organomegaly or masses felt. Normal bowel sounds  heard. Central nervous system: Drowsy and oriented x2. No focal neurological deficits. Extremities: Symmetric  Skin: No rashes, lesions or ulcers Psychiatry:  Mood & affect appropriate.     Data Reviewed: I have personally reviewed following labs and imaging studies  CBC: Recent Labs  Lab 01/11/20 0656 01/12/20 0457 01/15/20 0718 01/16/20 0502 01/17/20 0513  WBC 6.7 6.7 11.2* 10.5 9.7  NEUTROABS  --   --  9.9* 9.3* 7.3  HGB 12.0* 11.7* 11.3* 13.3 11.6*  HCT 36.2* 37.2* 34.4* 42.5 36.2*  MCV 88.1 91.4 88.0 92.0 89.6  PLT PLATELET CLUMPS NOTED ON SMEAR, UNABLE TO ESTIMATE 181 140* PLATELET CLUMPING, SUGGEST RECOLLECTION OF SAMPLE IN CITRATE TUBE. 403*   Basic Metabolic Panel: Recent Labs  Lab 01/12/20 0457 01/14/20 0945 01/15/20 0718 01/16/20 0502 01/17/20 0513  NA 140 141 143 145 150*  K 4.5 4.5 3.7 5.1 3.7  CL 105 107 109 109 119*  CO2 23 16* 16* 19* 21*  GLUCOSE 77 328* 306* 234* 213*  BUN 31* 68* 91* 87* 73*  CREATININE 1.32* 2.44* 2.59* 2.48* 1.75*  CALCIUM 9.7 9.3 9.5 9.8 9.1  MG  --  2.5* 2.7* 2.9* 2.9*   GFR: Estimated Creatinine Clearance: 41 mL/min (A) (by C-G formula based on SCr of 1.75 mg/dL (H)). Liver Function Tests: Recent Labs  Lab 01/11/20 0656  AST 29  ALT 9  ALKPHOS 82  BILITOT 1.4*  PROT 9.4*  ALBUMIN 4.0   No results for input(s): LIPASE, AMYLASE in the last 168 hours. Recent Labs  Lab 01/14/20 0945  AMMONIA 14   Coagulation Profile: Recent Labs  Lab 01/11/20 1235  INR 1.3*   Cardiac Enzymes: No results for input(s): CKTOTAL, CKMB, CKMBINDEX, TROPONINI in the last 168 hours. BNP (last 3 results) No results for input(s): PROBNP in the last 8760 hours. HbA1C: No results for input(s): HGBA1C in the last 72 hours. CBG: Recent Labs  Lab 01/16/20 1126 01/16/20 1336 01/16/20 1656 01/16/20 2107 01/17/20 0728  GLUCAP 246* 220* 214* 219* 195*   Lipid Profile: No results for input(s): CHOL, HDL, LDLCALC, TRIG, CHOLHDL,  LDLDIRECT in the last 72 hours. Thyroid Function Tests: No results for input(s): TSH, T4TOTAL, FREET4, T3FREE, THYROIDAB in the last 72 hours. Anemia Panel: No results for input(s): VITAMINB12, FOLATE, FERRITIN, TIBC, IRON, RETICCTPCT in the last 72 hours. Sepsis Labs: Recent Labs  Lab 01/11/20 0739 01/11/20 1235 01/14/20 0945  PROCALCITON  --   --  4.04  LATICACIDVEN 1.2 1.0  --     Recent Results (from the past 240 hour(s))  SARS Coronavirus 2 by RT PCR (hospital order, performed in [facility] hospital lab) Nasopharyngeal Nasopharyngeal Swab     Status: None   Collection Time: 01/11/20  6:55 AM   Specimen: Nasopharyngeal Swab  Result Value Ref Range Status   SARS Coronavirus 2 NEGATIVE NEGATIVE Final    Comment: (NOTE) SARS-CoV-2 target nucleic acids are NOT DETECTED.  The SARS-CoV-2 RNA is generally detectable in upper and lower respiratory specimens during the acute phase of infection. The lowest concentration of SARS-CoV-2 viral copies this assay can detect is 250 copies / mL. A negative result does not preclude SARS-CoV-2 infection and should not be used as the sole basis for treatment or other patient management decisions.  A negative result may occur with improper specimen collection / handling, submission of specimen other than nasopharyngeal swab, presence of viral mutation(s) within the areas targeted by this assay, and inadequate number of viral copies (<250 copies / mL). A negative result must be combined with clinical observations, patient history, and epidemiological information.  Fact Sheet for Patients:   StrictlyIdeas.no  Fact Sheet for Healthcare Providers: BankingDealers.co.za  This test is not yet approved or  cleared by the [place] FDA and has been authorized for detection and/or diagnosis of SARS-CoV-2 by FDA under an Emergency Use Authorization (EUA).  This EUA will remain in effect (meaning this  test can be used) for the duration of the COVID-19 declaration under Section 564(b)(1) of the Act, 21 U.S.C. section 360bbb-3(b)(1), unless the authorization is terminated or revoked sooner.  Performed at [facility], [address] [place], [place] [address]   CULTURE, BLOOD (ROUTINE X 2) w Reflex to ID Panel     Status: None   Collection Time: 01/11/20 12:35 PM   Specimen: BLOOD  Result Value Ref Range Status   Specimen Description BLOOD BLOOD LEFT HAND  Final   Special Requests   Final    BOTTLES DRAWN AEROBIC AND ANAEROBIC Blood Culture adequate volume   Culture   Final    NO GROWTH 5 DAYS Performed at [facility], [address] [place], [place] [address]    Report Status 01/16/2020 FINAL  Final  CULTURE, BLOOD (ROUTINE X 2) w Reflex to ID Panel     Status: None   Collection Time: 01/11/20 12:35 PM   Specimen: BLOOD  Result Value Ref Range Status   Specimen Description BLOOD RIGHT ANTECUBITAL  Final   Special Requests   Final    BOTTLES DRAWN  AEROBIC AND ANAEROBIC Blood Culture adequate volume   Culture   Final    NO GROWTH 5 DAYS Performed at [facility], [address] [place], [place] [address]    Report Status 01/16/2020 FINAL  Final  MRSA PCR Screening     Status: None   Collection Time: 01/16/20  9:57 AM   Specimen: Nasal Mucosa; Nasopharyngeal  Result Value Ref Range Status   MRSA by PCR NEGATIVE NEGATIVE Final    Comment:        The GeneXpert MRSA Assay (FDA approved for NASAL specimens only), is one component of a comprehensive MRSA colonization surveillance program. It is not intended to diagnose MRSA infection nor to guide or monitor treatment for MRSA infections. Performed at [facility], [address] [place], [place] [address]          Radiology Studies: DG Chest 2 View  Result Date: 01/17/2020 CLINICAL DATA:  Pneumonia EXAM: CHEST - 2 VIEW COMPARISON:  01/11/2020 FINDINGS: Normal heart size  and mediastinal contours. Single chamber pacer lead into the right ventricle. 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 Airspace opacity at the lung bases with possible small left pleural effusion. IMPRESSION: Bilateral lower lobe pneumonia. Electronically Signed   By: Monte Fantasia M.D.   On: 01/17/2020 08:28   PERIPHERAL VASCULAR CATHETERIZATION  Result Date: 01/16/2020 See op note       Scheduled Meds: . aspirin EC  81 mg Oral Daily  . atorvastatin  40 mg Oral QHS  . carvedilol  3.125 mg Oral BID WC  . Chlorhexidine Gluconate Cloth  6 each Topical Daily  . cholecalciferol  1,000 Units Oral Daily  . clopidogrel  75 mg Oral Daily  . diphenhydrAMINE  50 mg Oral QHS  . enoxaparin (LOVENOX) injection  30 mg Subcutaneous Q24H  . famotidine  20 mg Oral Daily  . fluticasone  2 spray Each Nare Daily  . gabapentin  200 mg Oral BID  . haloperidol  0.5 mg Oral BID  . insulin aspart  0-5 Units Subcutaneous QHS  . insulin aspart  0-9 Units Subcutaneous TID WC  . insulin glargine  12 Units Subcutaneous Daily  . metroNIDAZOLE  500 mg Oral Q8H  . OLANZapine zydis  10 mg Oral QHS  . senna-docusate  2 tablet Oral BID  . tamsulosin  0.4 mg Oral Daily   Continuous Infusions: . sodium chloride 75 mL/hr at 01/17/20 0732  . sodium chloride 75 mL/hr at 01/16/20 1433  . sodium chloride Stopped (01/15/20 0913)  . sodium chloride    . ceFEPime (MAXIPIME) IV    . vancomycin Stopped (01/16/20 1858)     LOS: 5 days    Time spent: 36 minutes    Sharen Hones, MD [facility]   To contact the attending provider between 7A-7P or the covering provider during after hours 7P-7A, please log into the web site [email or web address] and access using universal  password for that web site. If you do not have the password, please call the hospital operator.  01/17/2020, 9:43 AM

## 2020-01-17 NOTE — Progress Notes (Signed)
At 18:35 pt had 13 beats  v-tach run . Several isolated small episode as well per telemetry clerk.  Cardiologist was aware and ordered to give amiodarone early instead of 22:00 . Pt is not in distress.

## 2020-01-18 LAB — CBC WITH DIFFERENTIAL/PLATELET
Abs Immature Granulocytes: 0.13 10*3/uL — ABNORMAL HIGH (ref 0.00–0.07)
Basophils Absolute: 0 10*3/uL (ref 0.0–0.1)
Basophils Relative: 0 %
Eosinophils Absolute: 0 10*3/uL (ref 0.0–0.5)
Eosinophils Relative: 0 %
HCT: 34.4 % — ABNORMAL LOW (ref 39.0–52.0)
Hemoglobin: 11 g/dL — ABNORMAL LOW (ref 13.0–17.0)
Immature Granulocytes: 1 %
Lymphocytes Relative: 6 %
Lymphs Abs: 0.7 10*3/uL (ref 0.7–4.0)
MCH: 28.4 pg (ref 26.0–34.0)
MCHC: 32 g/dL (ref 30.0–36.0)
MCV: 88.9 fL (ref 80.0–100.0)
Monocytes Absolute: 1.6 10*3/uL — ABNORMAL HIGH (ref 0.1–1.0)
Monocytes Relative: 13 %
Neutro Abs: 9.4 10*3/uL — ABNORMAL HIGH (ref 1.7–7.7)
Neutrophils Relative %: 80 %
Platelets: 172 10*3/uL (ref 150–400)
RBC: 3.87 MIL/uL — ABNORMAL LOW (ref 4.22–5.81)
RDW: 15.9 % — ABNORMAL HIGH (ref 11.5–15.5)
WBC: 13.1 10*3/uL — ABNORMAL HIGH (ref 4.0–10.5)
nRBC: 0 % (ref 0.0–0.2)

## 2020-01-18 LAB — GLUCOSE, CAPILLARY
Glucose-Capillary: 177 mg/dL — ABNORMAL HIGH (ref 70–99)
Glucose-Capillary: 182 mg/dL — ABNORMAL HIGH (ref 70–99)
Glucose-Capillary: 212 mg/dL — ABNORMAL HIGH (ref 70–99)
Glucose-Capillary: 200 mg/dL — ABNORMAL HIGH (ref 70–99)
Glucose-Capillary: 240 mg/dL — ABNORMAL HIGH (ref 70–99)
Glucose-Capillary: 335 mg/dL — ABNORMAL HIGH (ref 70–99)

## 2020-01-18 LAB — PROCALCITONIN: Procalcitonin: 0.8 ng/mL

## 2020-01-18 LAB — BASIC METABOLIC PANEL
Anion gap: 15 (ref 5–15)
BUN: 61 mg/dL — ABNORMAL HIGH (ref 8–23)
CO2: 18 mmol/L — ABNORMAL LOW (ref 22–32)
Calcium: 9.4 mg/dL (ref 8.9–10.3)
Chloride: 118 mmol/L — ABNORMAL HIGH (ref 98–111)
Creatinine, Ser: 1.71 mg/dL — ABNORMAL HIGH (ref 0.61–1.24)
GFR calc Af Amer: 47 mL/min — ABNORMAL LOW (ref >60)
GFR calc non Af Amer: 41 mL/min — ABNORMAL LOW (ref >60)
Glucose, Bld: 183 mg/dL — ABNORMAL HIGH (ref 70–99)
Potassium: 3.4 mmol/L — ABNORMAL LOW (ref 3.5–5.1)
Sodium: 151 mmol/L — ABNORMAL HIGH (ref 135–145)

## 2020-01-18 LAB — HEMOGLOBIN A1C
Hgb A1c MFr Bld: 7.9 % — ABNORMAL HIGH (ref 4.8–5.6)
Mean Plasma Glucose: 180.03 mg/dL

## 2020-01-18 LAB — TROPONIN I (HIGH SENSITIVITY)
Troponin I (High Sensitivity): 169 ng/L (ref <18)
Troponin I (High Sensitivity): 187 ng/L (ref <18)
Troponin I (High Sensitivity): 188 ng/L (ref <18)

## 2020-01-18 LAB — MAGNESIUM: Magnesium: 2.9 mg/dL — ABNORMAL HIGH (ref 1.7–2.4)

## 2020-01-18 MED ORDER — POTASSIUM CHLORIDE CRYS ER 20 MEQ PO TBCR
40.0000 meq | EXTENDED_RELEASE_TABLET | Freq: Once | ORAL | Status: AC
  Administered 2020-01-18: 40 meq via ORAL
  Filled 2020-01-18: qty 2

## 2020-01-18 MED ORDER — DEXTROSE 5 % IV SOLN: INTRAVENOUS | Status: DC

## 2020-01-18 MED ORDER — FUROSEMIDE 10 MG/ML IJ SOLN
60.0000 mg | Freq: Two times a day (BID) | INTRAMUSCULAR | Status: DC
  Administered 2020-01-18 – 2020-01-19 (×3): 60 mg via INTRAVENOUS
  Filled 2020-01-18 (×3): qty 8

## 2020-01-18 MED ORDER — FUROSEMIDE 10 MG/ML IJ SOLN
60.0000 mg | Freq: Once | INTRAMUSCULAR | Status: AC
  Administered 2020-01-18: 60 mg via INTRAVENOUS
  Filled 2020-01-18: qty 8

## 2020-01-18 NOTE — Progress Notes (Signed)
Cross Cover Brief Note Jason Montes runs of v tach ongoing reported from RN BNP elevated - fluids stopped and lasix given  Troponin elevated - trending Hypernatremia continues - D5W started at 125 this am with scheduled lasix given his systolic heart failure. Given his heart failure. elevated troponins and ectopy, repeat echo may be beneficial

## 2020-01-18 NOTE — Progress Notes (Addendum)
[facility] Cardiology    SUBJECTIVE: Confused delirious encephalopathic   Vitals:   01/17/20 2100 01/18/20 0015 01/18/20 0359 01/18/20 0732  BP:  110/70 (!) 106/56 112/72  Pulse:   78 78  Resp:    18  Temp:   98.2 F (36.8 C) (!) 100.7 F (38.2 C)  TempSrc:   Oral Oral  SpO2:  96% 100% 93%  Weight: 73.7 kg        Intake/Output Summary (Last 24 hours) at 01/18/2020 9371 Last data filed at 01/18/2020 6967 Gross per 24 hour  Intake 986.8 ml  Output 1350 ml  Net -363.2 ml      PHYSICAL EXAM  General: Encephalopathy altered mental status confusion delirium HEENT:  Normocephalic and atramatic Neck:  No JVD.  Lungs: Clear bilaterally to auscultation and percussion. Heart: Irregular. Normal S1 and S2 without gallops or murmurs.  Abdomen: Bowel sounds are positive, abdomen soft and non-tender  Msk:  Back normal, normal gait. Normal strength and tone for age. Extremities: No clubbing, cyanosis or edema significant peripheral vascular disease including amputation on the right severe cellulitis infection on the left.   Neuro: Alert but not oriented confused encephalopathic. Psych: Unable to fully assess   LABS: Basic Metabolic Panel: Recent Labs    01/17/20 0513 01/17/20 0513 01/17/20 2246 01/18/20 0222  NA 150*   < > 150* 151*  K 3.7   < > 3.4* 3.4*  CL 119*   < > 118* 118*  CO2 21*   < > 21* 18*  GLUCOSE 213*   < > 220* 183*  BUN 73*   < > 62* 61*  CREATININE 1.75*   < > 1.73* 1.71*  CALCIUM 9.1   < > 9.2 9.4  MG 2.9*  --   --  2.9*   < > = values in this interval not displayed.   Liver Function Tests: No results for input(s): AST, ALT, ALKPHOS, BILITOT, PROT, ALBUMIN in the last 72 hours. No results for input(s): LIPASE, AMYLASE in the last 72 hours. CBC: Recent Labs    01/17/20 0513 01/18/20 0222  WBC 9.7 13.1*  NEUTROABS 7.3 9.4*  HGB 11.6* 11.0*  HCT 36.2* 34.4*  MCV 89.6 88.9  PLT 144* 172   Cardiac Enzymes: No results for input(s): CKTOTAL, CKMB,  CKMBINDEX, TROPONINI in the last 72 hours. BNP: Invalid input(s): POCBNP D-Dimer: No results for input(s): DDIMER in the last 72 hours. Hemoglobin A1C: Recent Labs    01/17/20 2246  HGBA1C 7.9*   Fasting Lipid Panel: No results for input(s): CHOL, HDL, LDLCALC, TRIG, CHOLHDL, LDLDIRECT in the last 72 hours. Thyroid Function Tests: No results for input(s): TSH, T4TOTAL, T3FREE, THYROIDAB in the last 72 hours.  Invalid input(s): FREET3 Anemia Panel: No results for input(s): VITAMINB12, FOLATE, FERRITIN, TIBC, IRON, RETICCTPCT in the last 72 hours.  DG Chest 2 View  Result Date: 01/17/2020 CLINICAL DATA:  Pneumonia EXAM: CHEST - 2 VIEW COMPARISON:  01/11/2020 FINDINGS: Normal heart size and mediastinal contours. Single chamber pacer lead into the right ventricle. 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 Airspace opacity at the lung bases with possible small left pleural effusion. IMPRESSION: Bilateral lower lobe pneumonia. Electronically Signed   By: Monte Fantasia M.D.   On: 01/17/2020 08:28   PERIPHERAL VASCULAR CATHETERIZATION  Result Date: 01/16/2020 See op note    Echo previously with moderately depressed left ventricular function EF around 40%  TELEMETRY: Persistent recurrent ventricular ectopy nonsustained VT occasional paced beats wide-complex rhythm  ASSESSMENT AND PLAN:  Principal Problem:   Acute metabolic encephalopathy Active Problems:   Atrial fibrillation (HCC)   CAD (coronary artery disease)   GERD with esophagitis   Chronic systolic CHF (congestive heart failure) (HCC)   Hyperlipidemia   Essential hypertension   Acute renal failure superimposed on stage 3a chronic kidney disease (HCC)   Stage 3a chronic kidney disease   Osteomyelitis of great toe of left foot (HCC)   Type II diabetes mellitus with renal manifestations ([place])   Stroke (HCC)   Depression   Suicidal behavior Ventricular ectopy and nonsustained ventricular tachycardia wide-complex  1.  Plan Continue  broad-spectrum antibiotic therapy Correct electrolyte abnormalities Continue to maintain diabetes management and control P.o. amiodarone load at 400 twice a day then will give probably 400 a day Maintain Coreg 6.25 twice a day may increase dose depending on blood pressure Severe acute metabolic encephalopathy with altered mental status continue to address underlying issues Nonsustained ventricular ectopy and tachycardia all secondary to underlying multisystem problems this is a cause-and-effect problem.  If not his heart causing a problem is everything else making his heart irritable and they have DVT will not go away until all is on the line issues are corrected.  There is no reasonable intervention beyond amiodarone and beta-blockers that be of any benefit.  Continue telemetry monitoring is of little clinical value in this case.  Therefore discontinue telemetry we need to focus on underlying medical problems not occasional recurrent ectopy. I plan to continue amiodarone and Coreg but until his electrolytes and other underlying medical problems including infection and sepsis encephalopathy are corrected the ectopy will not go away. Patient has an AICD in place and pacemaker but there is no need for repeat fire or deployed because his rates not sustained. Continue treatment for underlying cardiomyopathy and heart failure diuretics beta-blockers Entresto ,ACE or ARB if his renal function tolerates In my opinion it is best that we treat the patient not the number not the telemetry he needs aggressive persistent support for infection electrolyte acidosis and renal abnormalities encephalopathy. His cardiac arrhythmia are the least of his problems and have no effect on his overall clinical condition. I strongly recommend considering palliative care I will discussed the case in my decision with hospitalist and nursing staff  Yolonda Kida, MD 01/18/2020 9:37 AM

## 2020-01-18 NOTE — Progress Notes (Signed)
PT Cancellation Note  Patient Details Name: Jason Montes MRN: [id number] DOB: Nov 30, 1950   Cancelled Treatment:  PT attempt, PT hold. Pt currently in [place] and having desaturation to upper 70s. Will hold PT until pt is more appropriate to participate.      [place]lette Pa 01/18/2020, 9:45 AM

## 2020-01-18 NOTE — Discharge Instructions (Signed)
PODIATRY DISCHARGE INSTRUCTIONS: 1. Keep surgical dressings clean, dry, and and intact. 2. Ambulate in surgical shoe with heel contact only and try to stay off of surgical foot as much as possible. 3. Followup in clinic with Dr. Luana Shu within 1 week of your discharge date.

## 2020-01-18 NOTE — Care Management Important Message (Signed)
Important Message  Patient Details  Name: Jason Montes MRN: [id number] Date of Birth: 1951/05/07   Medicare Important Message Given:  Yes     Loann Quill 01/18/2020, 11:46 AM

## 2020-01-19 LAB — CBC
HCT: 34.5 % — ABNORMAL LOW (ref 39.0–52.0)
Hemoglobin: 10.7 g/dL — ABNORMAL LOW (ref 13.0–17.0)
MCH: 28.7 pg (ref 26.0–34.0)
MCHC: 31 g/dL (ref 30.0–36.0)
MCV: 92.5 fL (ref 80.0–100.0)
Platelets: 118 10*3/uL — ABNORMAL LOW (ref 150–400)
RBC: 3.73 MIL/uL — ABNORMAL LOW (ref 4.22–5.81)
RDW: 15.9 % — ABNORMAL HIGH (ref 11.5–15.5)
WBC: 12.5 10*3/uL — ABNORMAL HIGH (ref 4.0–10.5)
nRBC: 0 % (ref 0.0–0.2)

## 2020-01-19 LAB — BASIC METABOLIC PANEL
Anion gap: 13 (ref 5–15)
BUN: 54 mg/dL — ABNORMAL HIGH (ref 8–23)
CO2: 22 mmol/L (ref 22–32)
Calcium: 8.5 mg/dL — ABNORMAL LOW (ref 8.9–10.3)
Chloride: 109 mmol/L (ref 98–111)
Creatinine, Ser: 1.98 mg/dL — ABNORMAL HIGH (ref 0.61–1.24)
GFR calc Af Amer: 39 mL/min — ABNORMAL LOW (ref >60)
GFR calc non Af Amer: 34 mL/min — ABNORMAL LOW (ref >60)
Glucose, Bld: 185 mg/dL — ABNORMAL HIGH (ref 70–99)
Potassium: 3.3 mmol/L — ABNORMAL LOW (ref 3.5–5.1)
Sodium: 144 mmol/L (ref 135–145)

## 2020-01-19 LAB — GLUCOSE, CAPILLARY
Glucose-Capillary: 142 mg/dL — ABNORMAL HIGH (ref 70–99)
Glucose-Capillary: 157 mg/dL — ABNORMAL HIGH (ref 70–99)
Glucose-Capillary: 170 mg/dL — ABNORMAL HIGH (ref 70–99)
Glucose-Capillary: 173 mg/dL — ABNORMAL HIGH (ref 70–99)
Glucose-Capillary: 219 mg/dL — ABNORMAL HIGH (ref 70–99)
Glucose-Capillary: 241 mg/dL — ABNORMAL HIGH (ref 70–99)
Glucose-Capillary: 326 mg/dL — ABNORMAL HIGH (ref 70–99)

## 2020-01-19 LAB — MAGNESIUM: Magnesium: 2.5 mg/dL — ABNORMAL HIGH (ref 1.7–2.4)

## 2020-01-19 MED ORDER — SODIUM CHLORIDE 0.9 % IV BOLUS
500.0000 mL | Freq: Once | INTRAVENOUS | Status: AC
  Administered 2020-01-19: 500 mL via INTRAVENOUS

## 2020-01-19 MED ORDER — SODIUM CHLORIDE 0.9 % IV SOLN: INTRAVENOUS | Status: AC

## 2020-01-19 MED ORDER — POTASSIUM CHLORIDE CRYS ER 20 MEQ PO TBCR
40.0000 meq | EXTENDED_RELEASE_TABLET | Freq: Once | ORAL | Status: AC
  Administered 2020-01-19: 40 meq via ORAL
  Filled 2020-01-19: qty 2

## 2020-01-19 NOTE — Progress Notes (Signed)
PROGRESS NOTE    Jason Montes  RFF:[id number] DOB: 07/05/1950 DOA: 01/11/2020 PCP: [facility]   Chief complaint.  Altered mental status.  Brief Narrative:  Jason Montes a 69 y.o.malewith medical history significant ofhypertension, hyperlipidemia, diabetes mellitus, stroke, GERD, depression, RLS, PVD, CAD, CKD-3, atrial fibrillation on Eliquis, chronic left greater toe osteomyelitis,sCHF with EF of 40-45%, who presents with altered mental status and overdose ?unintentional.  8/14. Patient still has significant confusion today. Worsening renal function, start IV fluids. Restart lower dose Neurontin.Started antibiotics with vancomycin and meropenem for elevated procalcitonin level 4.04.Foley catheter was anchored for residual over 1370 ml.   8/16.  Patient condition much improved.  Not significant confusion.  Continue antibiotics.  Left foot angiography and angioplasty of left anterior tibial artery and the left peroneal artery by Dr. Lucky Cowboy.  8/17.  Patient developed a fever again while on antibiotics, chest x-ray showed bilateral lower lobe pneumonia.   Assessment & Plan:   Principal Problem:   Acute metabolic encephalopathy Active Problems:   Atrial fibrillation (HCC)   CAD (coronary artery disease)   GERD with esophagitis   Chronic systolic CHF (congestive heart failure) (HCC)   Hyperlipidemia   Essential hypertension   Acute renal failure superimposed on stage 3a chronic kidney disease (HCC)   Stage 3a chronic kidney disease   Osteomyelitis of great toe of left foot (HCC)   Type II diabetes mellitus with renal manifestations (HCC)   Stroke (HCC)   Depression   Suicidal behavior  Hypotension --systolic 35'T this morning.  Improved with IVF 500 ml. --continue MIVF@100  --Hold coreg and Lasix  1.  Acute metabolic encephalopathy. Appear to be secondary to acute infection. Condition gradually improving.  He was sleepy, he received some  Ativan early this morning.  Discontinue Ativan, continue as needed Haldol. There was some concern about suicide ideation, patient has been evaluated by psychiatry.  Patient is too sleepy to have thorough evaluation.  Need to consult psychiatry again when patient mental status improves.  #2.  Osteomyelitis of the left left great toe s/p amputation Developed a fever again on 8/18.  assumed to be secondary to aspiration pneumonia.  Blood culture sent out. --vanc/cefepime/flagyl  3.  Bilateral lower lobe aspiration pneumonia. Probably secondary to altered mental status.  Do not believe patient has an dysphagia.   --vanc/cefepime/flagyl  4.  Urinary retention secondary to benign prostate hypertrophy. Patient had significant urinary tension with residual over 1370 mL.   --continue Foley --continue Flomax  5.  Acute kidney injury on chronic kidney disease stage IIIa. Renal function improving.    6.  Hypernatremia. Patient received normal saline instead of lactated Ringer's solution during the surgery.    7.  Slight thrombocytopenia. Likely secondary to infection.   --monitor  8.  Paroxysmal atrial fibrillation. Currently in sinus.   Continue Eliquis  9.  Chronic systolic congestive heart failure with moderate pulmonary hypertension. No evidence of exacerbation.    10.  Uncontrolled type 2 diabetes with hyperglycemia. --continue Lantus 12u daily  11.  Nonsustained ventricular tachycardia. --treat underlying cause, per cards --d/c tele, per cards   DVT prophylaxis: [id number] Code Status: DNR  Family Communication: daughter updated on the phone today Status is: inpatient Dispo:   The patient is from: group home Anticipated d/c is to: undetermined Anticipated d/c date is: undetermined Patient currently is not medically stable to d/c due to: significant somnolence, confusion, unsafe discharge.   Consultants:  Podiatry. Psychiatry   Procedures:  Left hallux  amputation    Subjective: BP low this morning, improved with IVF.  More awake and interactive today.  Answered some questions, but largely confused and not oriented.   Objective: Vitals:   01/19/20 1151 01/19/20 1300 01/19/20 1407 01/19/20 1655  BP: (!) 96/49 (!) 94/52 (!) 94/52 (!) 90/53  Pulse: 92 86 85 83  Resp: 20 (!) 21 (!) 21 (!) 22  Temp: 98.1 F (36.7 C) 98.2 F (36.8 C) 98.2 F (36.8 C) (!) 97.5 F (36.4 C)  TempSrc: Oral Axillary Oral Axillary  SpO2: 96% 100% 100% 100%  Weight:        Intake/Output Summary (Last 24 hours) at 01/19/2020 1931 Last data filed at 01/19/2020 1800 Gross per 24 hour  Intake 2446.47 ml  Output 2075 ml  Net 371.47 ml   Filed Weights   01/17/20 0500 01/17/20 2100 01/19/20 0500  Weight: 73.4 kg 73.7 kg 75.5 kg    Examination:  Constitutional: NAD, alert, oriented to person only HEENT: conjunctivae and lids normal, EOMI CV: RRR no M,R,G. Distal pulses +2.  No cyanosis.   RESP: CTA B/L, normal respiratory effort, on 4L GI: +BS, NTND Extremities: No effusions, edema, or tenderness in BLE SKIN: warm, dry and intact Neuro: II - XII grossly intact.     Data Reviewed: I have personally reviewed following labs and imaging studies  CBC: Recent Labs  Lab 01/15/20 0718 01/16/20 0502 01/17/20 0513 01/18/20 0222 01/19/20 0457  WBC 11.2* 10.5 9.7 13.1* 12.5*  NEUTROABS 9.9* 9.3* 7.3 9.4*  --   HGB 11.3* 13.3 11.6* 11.0* 10.7*  HCT 34.4* 42.5 36.2* 34.4* 34.5*  MCV 88.0 92.0 89.6 88.9 92.5  PLT 140* PLATELET CLUMPING, SUGGEST RECOLLECTION OF SAMPLE IN CITRATE TUBE. 144* 172 619*   Basic Metabolic Panel: Recent Labs  Lab 01/15/20 0718 01/15/20 0718 01/16/20 0502 01/17/20 0513 01/17/20 2246 01/18/20 0222 01/19/20 0457  NA 143   < > 145 150* 150* 151* 144  K 3.7   < > 5.1 3.7 3.4* 3.4* 3.3*  CL 109   < > 109 119* 118* 118* 109  CO2 16*   < > 19* 21* 21* 18* 22  GLUCOSE 306*   < > 234* 213* 220* 183* 185*  BUN 91*   < >  87* 73* 62* 61* 54*  CREATININE 2.59*   < > 2.48* 1.75* 1.73* 1.71* 1.98*  CALCIUM 9.5   < > 9.8 9.1 9.2 9.4 8.5*  MG 2.7*  --  2.9* 2.9*  --  2.9* 2.5*   < > = values in this interval not displayed.   GFR: Estimated Creatinine Clearance: 36.2 mL/min (A) (by C-G formula based on SCr of 1.98 mg/dL (H)). Liver Function Tests: No results for input(s): AST, ALT, ALKPHOS, BILITOT, PROT, ALBUMIN in the last 168 hours. No results for input(s): LIPASE, AMYLASE in the last 168 hours. Recent Labs  Lab 01/14/20 0945  AMMONIA 14   Coagulation Profile: No results for input(s): INR, PROTIME in the last 168 hours. Cardiac Enzymes: No results for input(s): CKTOTAL, CKMB, CKMBINDEX, TROPONINI in the last 168 hours. BNP (last 3 results) No results for input(s): PROBNP in the last 8760 hours. HbA1C: Recent Labs    01/17/20 2246  HGBA1C 7.9*   CBG: Recent Labs  Lab 01/19/20 0012 01/19/20 0431 01/19/20 0755 01/19/20 1153 01/19/20 1627  GLUCAP 326* 173* 157* 241* 219*   Lipid Profile: No results for input(s): CHOL, HDL, LDLCALC, TRIG, CHOLHDL, LDLDIRECT in the last 72 hours. Thyroid  Function Tests: No results for input(s): TSH, T4TOTAL, FREET4, T3FREE, THYROIDAB in the last 72 hours. Anemia Panel: No results for input(s): VITAMINB12, FOLATE, FERRITIN, TIBC, IRON, RETICCTPCT in the last 72 hours. Sepsis Labs: Recent Labs  Lab 01/14/20 0945 01/18/20 0222  PROCALCITON 4.04 0.80    Recent Results (from the past 240 hour(s))  SARS Coronavirus 2 by RT PCR (hospital order, performed in [facility] hospital lab) Nasopharyngeal Nasopharyngeal Swab     Status: None   Collection Time: 01/11/20  6:55 AM   Specimen: Nasopharyngeal Swab  Result Value Ref Range Status   SARS Coronavirus 2 NEGATIVE NEGATIVE Final    Comment: (NOTE) SARS-CoV-2 target nucleic acids are NOT DETECTED.  The SARS-CoV-2 RNA is generally detectable in upper and lower respiratory specimens during the acute phase of  infection. The lowest concentration of SARS-CoV-2 viral copies this assay can detect is 250 copies / mL. A negative result does not preclude SARS-CoV-2 infection and should not be used as the sole basis for treatment or other patient management decisions.  A negative result may occur with improper specimen collection / handling, submission of specimen other than nasopharyngeal swab, presence of viral mutation(s) within the areas targeted by this assay, and inadequate number of viral copies (<250 copies / mL). A negative result must be combined with clinical observations, patient history, and epidemiological information.  Fact Sheet for Patients:   StrictlyIdeas.no  Fact Sheet for Healthcare Providers: BankingDealers.co.za  This test is not yet approved or  cleared by the [place] FDA and has been authorized for detection and/or diagnosis of SARS-CoV-2 by FDA under an Emergency Use Authorization (EUA).  This EUA will remain in effect (meaning this test can be used) for the duration of the COVID-19 declaration under Section 564(b)(1) of the Act, 21 U.S.C. section 360bbb-3(b)(1), unless the authorization is terminated or revoked sooner.  Performed at [facility], [address] [place], [place] [address]   CULTURE, BLOOD (ROUTINE X 2) w Reflex to ID Panel     Status: None   Collection Time: 01/11/20 12:35 PM   Specimen: BLOOD  Result Value Ref Range Status   Specimen Description BLOOD BLOOD LEFT HAND  Final   Special Requests   Final    BOTTLES DRAWN AEROBIC AND ANAEROBIC Blood Culture adequate volume   Culture   Final    NO GROWTH 5 DAYS Performed at [facility], [address] [place], [place] [address]    Report Status 01/16/2020 FINAL  Final  CULTURE, BLOOD (ROUTINE X 2) w Reflex to ID Panel     Status: None   Collection Time: 01/11/20 12:35 PM   Specimen: BLOOD  Result Value Ref Range Status    Specimen Description BLOOD RIGHT ANTECUBITAL  Final   Special Requests   Final    BOTTLES DRAWN AEROBIC AND ANAEROBIC Blood Culture adequate volume   Culture   Final    NO GROWTH 5 DAYS Performed at [facility], [address] [place], [place] [address]    Report Status 01/16/2020 FINAL  Final  MRSA PCR Screening     Status: None   Collection Time: 01/16/20  9:57 AM   Specimen: Nasal Mucosa; Nasopharyngeal  Result Value Ref Range Status   MRSA by PCR NEGATIVE NEGATIVE Final    Comment:        The GeneXpert MRSA Assay (FDA approved for NASAL specimens only), is one component of a comprehensive MRSA colonization surveillance program. It is not  intended to diagnose MRSA infection nor to guide or monitor treatment for MRSA infections. Performed at [facility], [address] [place], [place] [address]   CULTURE, BLOOD (ROUTINE X 2) w Reflex to ID Panel     Status: None (Preliminary result)   Collection Time: 01/17/20  9:02 AM   Specimen: BLOOD  Result Value Ref Range Status   Specimen Description BLOOD RIGHT [facility]  Final   Special Requests   Final    BOTTLES DRAWN AEROBIC AND ANAEROBIC Blood Culture adequate volume   Culture   Final    NO GROWTH 2 DAYS Performed at [facility], [address] [place], [place] [address]    Report Status PENDING  Incomplete  CULTURE, BLOOD (ROUTINE X 2) w Reflex to ID Panel     Status: None (Preliminary result)   Collection Time: 01/17/20  9:10 AM   Specimen: BLOOD  Result Value Ref Range Status   Specimen Description BLOOD LEFT [facility]  Final   Special Requests   Final    BOTTLES DRAWN AEROBIC AND ANAEROBIC Blood Culture adequate volume   Culture   Final    NO GROWTH 2 DAYS Performed at [facility], [address] [place], [place] [address]    Report Status PENDING  Incomplete         Radiology Studies: No results found.      Scheduled Meds: . amiodarone  400 mg Oral BID  . apixaban  5  mg Oral BID  . aspirin EC  81 mg Oral Daily  . atorvastatin  40 mg Oral QHS  . Chlorhexidine Gluconate Cloth  6 each Topical Daily  . cholecalciferol  1,000 Units Oral Daily  . clopidogrel  75 mg Oral Daily  . diphenhydrAMINE  50 mg Oral QHS  . famotidine  20 mg Oral Daily  . fluticasone  2 spray Each Nare Daily  . gabapentin  200 mg Oral BID  . haloperidol  0.5 mg Oral BID  . insulin aspart  0-15 Units Subcutaneous Q4H  . insulin glargine  12 Units Subcutaneous Daily  . metroNIDAZOLE  500 mg Oral Q8H  . OLANZapine zydis  10 mg Oral QHS  . senna-docusate  2 tablet Oral BID  . tamsulosin  0.4 mg Oral Daily   Continuous Infusions: . sodium chloride Stopped (01/15/20 0913)  . sodium chloride 100 mL/hr at 01/19/20 1800  . ceFEPime (MAXIPIME) IV Stopped (01/19/20 1023)  . vancomycin 1,000 mg (01/19/20 1859)     LOS: 7 days     Enzo Bi, MD [facility]   To contact the attending provider between 7A-7P or the covering provider during after hours 7P-7A, please log into the web site [email or web address] and access using universal [place] password for that web site. If you do not have the password, please call the hospital operator.  01/19/2020, 7:31 PM

## 2020-01-19 NOTE — Significant Event (Signed)
Rapid Response Event Note   Reason for Call :   Low pressure Initial Focused Assessment:   Bedside RN stated that patient's BP dropped to the 99'V systolic-  At this time- BP 44'Q systolic.   Interventions:  A new IV was started- Dr. Billie Ruddy had ordered 548ml bolus- patient BP in low 584'K systolic- other vitals stable- patient alert and oriented-   Plan of Care:  Patient stable to stay on floor-will have bedside RN to reassess BP.   Event Summary:   MD Notified:  Call Time: Arrival Time: End Time:  Penne Lash, RN

## 2020-01-19 NOTE — Progress Notes (Signed)
PT Cancellation Note  Patient Details Name: VEER ELAMIN MRN: [id number] DOB: [birth year]   Cancelled Treatment:     PT attempt. Per discussion with RN. Pt is not appropriate to particpate. Has had hypotension all morning. Will continue to effort to follow when appropriate to participate and once more medically stable.    Willette Pa 01/19/2020, 11:32 AM

## 2020-01-19 NOTE — Progress Notes (Signed)
SUBJECTIVE: Patient is confused and unable to give any complaints has not eaten this afternoon yet.   Vitals:   01/19/20 0952 01/19/20 1101 01/19/20 1151 01/19/20 1300  BP: (!) 89/61 (!) 91/53 (!) 96/49 (!) 94/52  Pulse: 94 92 92 86  Resp: 20 20 20  (!) 21  Temp: (!) 97.5 F (36.4 C) 98 F (36.7 C) 98.1 F (36.7 C) 98.2 F (36.8 C)  TempSrc: Oral Axillary Oral Axillary  SpO2: 94% 95% 96% 100%  Weight:        Intake/Output Summary (Last 24 hours) at 01/19/2020 1420 Last data filed at 01/19/2020 0915 Gross per 24 hour  Intake 2720.5 ml  Output 1450 ml  Net 1270.5 ml    LABS: Basic Metabolic Panel: Recent Labs    01/18/20 0222 01/19/20 0457  NA 151* 144  K 3.4* 3.3*  CL 118* 109  CO2 18* 22  GLUCOSE 183* 185*  BUN 61* 54*  CREATININE 1.71* 1.98*  CALCIUM 9.4 8.5*  MG 2.9* 2.5*   Liver Function Tests: No results for input(s): AST, ALT, ALKPHOS, BILITOT, PROT, ALBUMIN in the last 72 hours. No results for input(s): LIPASE, AMYLASE in the last 72 hours. CBC: Recent Labs    01/17/20 0513 01/17/20 0513 01/18/20 0222 01/19/20 0457  WBC 9.7   < > 13.1* 12.5*  NEUTROABS 7.3  --  9.4*  --   HGB 11.6*   < > 11.0* 10.7*  HCT 36.2*   < > 34.4* 34.5*  MCV 89.6   < > 88.9 92.5  PLT 144*   < > 172 118*   < > = values in this interval not displayed.   Cardiac Enzymes: No results for input(s): CKTOTAL, CKMB, CKMBINDEX, TROPONINI in the last 72 hours. BNP: Invalid input(s): POCBNP D-Dimer: No results for input(s): DDIMER in the last 72 hours. Hemoglobin A1C: Recent Labs    01/17/20 2246  HGBA1C 7.9*   Fasting Lipid Panel: No results for input(s): CHOL, HDL, LDLCALC, TRIG, CHOLHDL, LDLDIRECT in the last 72 hours. Thyroid Function Tests: No results for input(s): TSH, T4TOTAL, T3FREE, THYROIDAB in the last 72 hours.  Invalid input(s): FREET3 Anemia Panel: No results for input(s): VITAMINB12, FOLATE, FERRITIN, TIBC, IRON, RETICCTPCT in the last 72  hours.   PHYSICAL EXAM General: Well developed, well nourished, in no acute distress HEENT:  Normocephalic and atramatic Neck:  No JVD.  Lungs: Clear bilaterally to auscultation and percussion. Heart: HRRR . Normal S1 and S2 without gallops or murmurs.  Abdomen: Bowel sounds are positive, abdomen soft and non-tender  Msk:  Back normal, normal gait. Normal strength and tone for age. Extremities: No clubbing, cyanosis or edema.   Neuro: Alert and oriented X 3. Psych:  Good affect, responds appropriately  TELEMETRY: Patient is not on monitor  ASSESSMENT AND PLAN: Metabolic encephalopathy/coronary artery disease/atrial fibrillation/nonsustained ventricular tachycardia.  Patient is having altered mental status.  Thus should be made DNR.  Continue amiodarone..  Principal Problem:   Acute metabolic encephalopathy Active Problems:   Atrial fibrillation (HCC)   CAD (coronary artery disease)   GERD with esophagitis   Chronic systolic CHF (congestive heart failure) (HCC)   Hyperlipidemia   Essential hypertension   Acute renal failure superimposed on stage 3a chronic kidney disease (HCC)   Stage 3a chronic kidney disease   Osteomyelitis of great toe of left foot (HCC)   Type II diabetes mellitus with renal manifestations ([place])   Stroke ([place])   Depression   Suicidal behavior  Dionisio David, MD, [facility] 01/19/2020 2:20 PM

## 2020-01-19 NOTE — Progress Notes (Signed)
PROGRESS NOTE    Jason Montes  BWL:[id number] DOB: [birth year] DOA: 01/11/2020 PCP: [facility]   Chief complaint.  Altered mental status.  Brief Narrative:  Jason Bais [name] a 69 y.o.malewith medical history significant ofhypertension, hyperlipidemia, diabetes mellitus, stroke, GERD, depression, RLS, PVD, CAD, CKD-3, atrial fibrillation on Eliquis, chronic left greater toe osteomyelitis,sCHF with EF of 40-45%, who presents with altered mental status and overdose ?unintentional.  8/14. Patient still has significant confusion today. Worsening renal function, start IV fluids. Restart lower dose Neurontin.Started antibiotics with vancomycin and meropenem for elevated procalcitonin level 4.04.Foley catheter was anchored for residual over 1370 ml.   8/16.  Patient condition much improved.  Not significant confusion.  Continue antibiotics.  Left foot angiography and angioplasty of left anterior tibial artery and the left peroneal artery by Dr. Lucky Cowboy.  8/17.  Patient developed a fever again while on antibiotics, chest x-ray showed bilateral lower lobe pneumonia.   Assessment & Plan:   Principal Problem:   Acute metabolic encephalopathy Active Problems:   Atrial fibrillation (HCC)   CAD (coronary artery disease)   GERD with esophagitis   Chronic systolic CHF (congestive heart failure) (HCC)   Hyperlipidemia   Essential hypertension   Acute renal failure superimposed on stage 3a chronic kidney disease (HCC)   Stage 3a chronic kidney disease   Osteomyelitis of great toe of left foot (HCC)   Type II diabetes mellitus with renal manifestations ([place])   Stroke ([place])   Depression   Suicidal behavior  1.  Acute metabolic encephalopathy. Appear to be secondary to acute infection. Condition gradually improving.  He was sleepy, he received some Ativan early this morning.  Discontinue Ativan, continue as needed Haldol. There was some concern about suicide  ideation, patient has been evaluated by psychiatry.  Patient is too sleepy to have thorough evaluation.  Need to consult psychiatry again when patient mental status improves.  #2.  Osteomyelitis of the left left great toe. Developed a fever again since yesterday, assumed to be secondary to aspiration pneumonia.  Blood culture sent out. --vanc/cefepime/flagyl  3.  Bilateral lower lobe aspiration pneumonia. Probably secondary to altered mental status.  Do not believe patient has an dysphagia.   --vanc/cefepime/flagyl  4.  Urinary retention secondary to benign prostate hypertrophy. Patient had significant urinary tension with residual over 1370 mL.  Continue Foley catheter for now.  On Flomax.  Patient may need keep Foley catheter for at least a week due to significant distention of bladder.  5.  Acute kidney injury on chronic kidney disease stage IIIa. Renal function improving.    6.  Hypernatremia. Patient received normal saline instead of lactated Ringer's solution during the surgery.    7.  Slight thrombocytopenia. Likely secondary to infection.   --monitor  8.  Paroxysmal atrial fibrillation. Currently in sinus.   Continue Eliquis  9.  Chronic systolic congestive heart failure with moderate pulmonary hypertension. No evidence of exacerbation.    10.  Uncontrolled type 2 diabetes with hyperglycemia. --continue Lantus 12u daily  11.  Nonsustained ventricular tachycardia. --treat underlying cause, per cards --d/c tele, per cards   DVT prophylaxis: GO:TLXBWIO Code Status: DNR  Family Communication:  Status is: inpatient Dispo:   The patient is from: group home Anticipated d/c is to: undetermined Anticipated d/c date is: undetermined Patient currently is not medically stable to d/c due to: significant somnolence, confusion, unsafe discharge.    Consultants:  Podiatry. Psychiatry   Procedures:  Left hallux amputation  Subjective: Still sleepy,  however per sitter, when pt is awake, he attempts to get out of bed.  Drank some fluid, but not eating much.   Objective: Vitals:   01/18/20 0732 01/18/20 1135 01/18/20 1224 01/18/20 1941  BP: 112/72 94/73 (!) 98/57 (!) 95/58  Pulse: 78 95 93 82  Resp: 18 20 20 19   Temp: (!) 100.7 F (38.2 C) (!) 100.8 F (38.2 C) 98.5 F (36.9 C) (!) 97.5 F (36.4 C)  TempSrc: Oral Oral    SpO2: 93% 94% 95% 99%  Weight:        Intake/Output Summary (Last 24 hours) at 01/18/2020 1957 Last data filed at 01/18/2020 1700 Gross per 24 hour  Intake 1751.07 ml  Output 2350 ml  Net -598.93 ml   Filed Weights   01/16/20 0500 01/17/20 0500 01/17/20 2100  Weight: 72.6 kg 73.4 kg 73.7 kg    Examination:  Constitutional: NAD, sleeping CV: RRR no M,R,G. Distal pulses +2.  No cyanosis.   RESP: CTA B/L, normal respiratory effort  GI: +BS, ND Extremities: No effusions, edema in BLE SKIN: warm, dry and intact    Data Reviewed: I have personally reviewed following labs and imaging studies  CBC: Recent Labs  Lab 01/12/20 0457 01/15/20 0718 01/16/20 0502 01/17/20 0513 01/18/20 0222  WBC 6.7 11.2* 10.5 9.7 13.1*  NEUTROABS  --  9.9* 9.3* 7.3 9.4*  HGB 11.7* 11.3* 13.3 11.6* 11.0*  HCT 37.2* 34.4* 42.5 36.2* 34.4*  MCV 91.4 88.0 92.0 89.6 88.9  PLT 181 140* PLATELET CLUMPING, SUGGEST RECOLLECTION OF SAMPLE IN CITRATE TUBE. 144* 008   Basic Metabolic Panel: Recent Labs  Lab 01/14/20 0945 01/14/20 0945 01/15/20 0718 01/16/20 0502 01/17/20 0513 01/17/20 2246 01/18/20 0222  NA 141   < > 143 145 150* 150* 151*  K 4.5   < > 3.7 5.1 3.7 3.4* 3.4*  CL 107   < > 109 109 119* 118* 118*  CO2 16*   < > 16* 19* 21* 21* 18*  GLUCOSE 328*   < > 306* 234* 213* 220* 183*  BUN 68*   < > 91* 87* 73* 62* 61*  CREATININE 2.44*   < > 2.59* 2.48* 1.75* 1.73* 1.71*  CALCIUM 9.3   < > 9.5 9.8 9.1 9.2 9.4  MG 2.5*  --  2.7* 2.9* 2.9*  --  2.9*   < > = values in this interval not displayed.    GFR: Estimated Creatinine Clearance: 41.9 mL/min (A) (by C-G formula based on SCr of 1.71 mg/dL (H)). Liver Function Tests: No results for input(s): AST, ALT, ALKPHOS, BILITOT, PROT, ALBUMIN in the last 168 hours. No results for input(s): LIPASE, AMYLASE in the last 168 hours. Recent Labs  Lab 01/14/20 0945  AMMONIA 14   Coagulation Profile: No results for input(s): INR, PROTIME in the last 168 hours. Cardiac Enzymes: No results for input(s): CKTOTAL, CKMB, CKMBINDEX, TROPONINI in the last 168 hours. BNP (last 3 results) No results for input(s): PROBNP in the last 8760 hours. HbA1C: Recent Labs    01/17/20 2246  HGBA1C 7.9*   CBG: Recent Labs  Lab 01/18/20 0359 01/18/20 0749 01/18/20 0935 01/18/20 1137 01/18/20 1617  GLUCAP 177* 182* 212* 200* 240*   Lipid Profile: No results for input(s): CHOL, HDL, LDLCALC, TRIG, CHOLHDL, LDLDIRECT in the last 72 hours. Thyroid Function Tests: No results for input(s): TSH, T4TOTAL, FREET4, T3FREE, THYROIDAB in the last 72 hours. Anemia Panel: No results for input(s): VITAMINB12, FOLATE, FERRITIN,  TIBC, IRON, RETICCTPCT in the last 72 hours. Sepsis Labs: Recent Labs  Lab 01/14/20 0945 01/18/20 0222  PROCALCITON 4.04 0.80    Recent Results (from the past 240 hour(s))  SARS Coronavirus 2 by RT PCR (hospital order, performed in [facility] hospital lab) Nasopharyngeal Nasopharyngeal Swab     Status: None   Collection Time: 01/11/20  6:55 AM   Specimen: Nasopharyngeal Swab  Result Value Ref Range Status   SARS Coronavirus 2 NEGATIVE NEGATIVE Final    Comment: (NOTE) SARS-CoV-2 target nucleic acids are NOT DETECTED.  The SARS-CoV-2 RNA is generally detectable in upper and lower respiratory specimens during the acute phase of infection. The lowest concentration of SARS-CoV-2 viral copies this assay can detect is 250 copies / mL. A negative result does not preclude SARS-CoV-2 infection and should not be used as the sole basis  for treatment or other patient management decisions.  A negative result may occur with improper specimen collection / handling, submission of specimen other than nasopharyngeal swab, presence of viral mutation(s) within the areas targeted by this assay, and inadequate number of viral copies (<250 copies / mL). A negative result must be combined with clinical observations, patient history, and epidemiological information.  Fact Sheet for Patients:   StrictlyIdeas.no  Fact Sheet for Healthcare Providers: BankingDealers.co.za  This test is not yet approved or  cleared by the [place] FDA and has been authorized for detection and/or diagnosis of SARS-CoV-2 by FDA under an Emergency Use Authorization (EUA).  This EUA will remain in effect (meaning this test can be used) for the duration of the COVID-19 declaration under Section 564(b)(1) of the Act, 21 U.S.C. section 360bbb-3(b)(1), unless the authorization is terminated or revoked sooner.  Performed at [facility], [address] [place], [place] [address]   CULTURE, BLOOD (ROUTINE X 2) w Reflex to ID Panel     Status: None   Collection Time: 01/11/20 12:35 PM   Specimen: BLOOD  Result Value Ref Range Status   Specimen Description BLOOD BLOOD LEFT HAND  Final   Special Requests   Final    BOTTLES DRAWN AEROBIC AND ANAEROBIC Blood Culture adequate volume   Culture   Final    NO GROWTH 5 DAYS Performed at [facility], [address] [place], [place] [address]    Report Status 01/16/2020 FINAL  Final  CULTURE, BLOOD (ROUTINE X 2) w Reflex to ID Panel     Status: None   Collection Time: 01/11/20 12:35 PM   Specimen: BLOOD  Result Value Ref Range Status   Specimen Description BLOOD RIGHT ANTECUBITAL  Final   Special Requests   Final    BOTTLES DRAWN AEROBIC AND ANAEROBIC Blood Culture adequate volume   Culture   Final    NO GROWTH 5 DAYS Performed at  [facility], [address] [place], [place] [address]    Report Status 01/16/2020 FINAL  Final  MRSA PCR Screening     Status: None   Collection Time: 01/16/20  9:57 AM   Specimen: Nasal Mucosa; Nasopharyngeal  Result Value Ref Range Status   MRSA by PCR NEGATIVE NEGATIVE Final    Comment:        The GeneXpert MRSA Assay (FDA approved for NASAL specimens only), is one component of a comprehensive MRSA colonization surveillance program. It is not intended to diagnose MRSA infection nor to guide or monitor treatment for MRSA infections. Performed at [facility], [place], [place]  [address]   CULTURE, BLOOD (ROUTINE X 2) w Reflex to ID Panel     Status: None (Preliminary result)   Collection Time: 01/17/20  9:02 AM   Specimen: BLOOD  Result Value Ref Range Status   Specimen Description BLOOD RIGHT [facility]  Final   Special Requests   Final    BOTTLES DRAWN AEROBIC AND ANAEROBIC Blood Culture adequate volume   Culture   Final    NO GROWTH < 24 HOURS Performed at [facility], [address], [place], [place] [address]    Report Status PENDING  Incomplete  CULTURE, BLOOD (ROUTINE X 2) w Reflex to ID Panel     Status: None (Preliminary result)   Collection Time: 01/17/20  9:10 AM   Specimen: BLOOD  Result Value Ref Range Status   Specimen Description BLOOD LEFT AC  Final   Special Requests   Final    BOTTLES DRAWN AEROBIC AND ANAEROBIC Blood Culture adequate volume   Culture   Final    NO GROWTH < 24 HOURS Performed at [facility], [address], [place], [place] [address]    Report Status PENDING  Incomplete         Radiology Studies: DG Chest 2 View  Result Date: 01/17/2020 CLINICAL DATA:  Pneumonia EXAM: CHEST - 2 VIEW COMPARISON:  01/11/2020 FINDINGS: Normal heart size and mediastinal contours. Single chamber pacer lead into the right ventricle. 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 Airspace opacity at the lung bases with  possible small left pleural effusion. IMPRESSION: Bilateral lower lobe pneumonia. Electronically Signed   By: Monte Fantasia M.D.   On: 01/17/2020 08:28        Scheduled Meds: . amiodarone  400 mg Oral BID  . apixaban  5 mg Oral BID  . aspirin EC  81 mg Oral Daily  . atorvastatin  40 mg Oral QHS  . carvedilol  6.25 mg Oral BID WC  . Chlorhexidine Gluconate Cloth  6 each Topical Daily  . cholecalciferol  1,000 Units Oral Daily  . clopidogrel  75 mg Oral Daily  . diphenhydrAMINE  50 mg Oral QHS  . famotidine  20 mg Oral Daily  . fluticasone  2 spray Each Nare Daily  . furosemide  60 mg Intravenous Q12H  . gabapentin  200 mg Oral BID  . haloperidol  0.5 mg Oral BID  . insulin aspart  0-15 Units Subcutaneous Q4H  . insulin glargine  12 Units Subcutaneous Daily  . metroNIDAZOLE  500 mg Oral Q8H  . OLANZapine zydis  10 mg Oral QHS  . senna-docusate  2 tablet Oral BID  . tamsulosin  0.4 mg Oral Daily   Continuous Infusions: . sodium chloride Stopped (01/15/20 0913)  . ceFEPime (MAXIPIME) IV Stopped (01/18/20 1242)  . dextrose 125 mL/hr at 01/18/20 1700  . vancomycin 1,000 mg (01/18/20 1838)     LOS: 6 days     Enzo Bi, MD [facility]   To contact the attending provider between 7A-7P or the covering provider during after hours 7P-7A, please log into the web site [email or web address] and access using universal [place] password for that web site. If you do not have the password, please call the hospital operator.  01/18/2020, 7:57 PM

## 2020-01-19 NOTE — Progress Notes (Signed)
Inpatient Diabetes Program Recommendations  AACE/ADA: New Consensus Statement on Inpatient Glycemic Control (2015)  Target Ranges:  Prepandial:   less than 140 mg/dL      Peak postprandial:   less than 180 mg/dL (1-2 hours)      Critically ill patients:  140 - 180 mg/dL   Results for Jason Montes, Jason Montes (MRN [id number]) as of 01/19/2020 12:28  Ref. Range 01/19/2020 00:12 01/19/2020 04:31 01/19/2020 07:55 01/19/2020 11:53  Glucose-Capillary Latest Ref Range: 70 - 99 mg/dL 326 (H)  11 units NOVOLOG  173 (H)  3 units NOVOLOG  157 (H)  3 units NOVOLOG +  12 units LANTUS  241 (H)     Home DM Meds: Jardiance 12.5 mg Daily                             Metformin 500 mg BID                             Ozempic 1 mg Qweek  Current Orders: Lantus 12 units Daily      Novolog Moderate Correction Scale/ SSI (0-15 units) Q4 hours    MD- Please consider the following in-hospital insulin adjustments:  Increase Lantus slightly to 14 units daily    --Will follow patient during hospitalization--  Wyn Quaker RN, MSN, CDE Diabetes Coordinator Inpatient Glycemic Control Team Team Pager: [phone] (8a-5p)

## 2020-01-19 NOTE — Progress Notes (Signed)
   01/19/20 0804  Assess: MEWS Score  BP (!) 68/42  Assess: MEWS Score  MEWS Temp 0  MEWS Systolic 3  MEWS Pulse 2  MEWS RR 0  MEWS LOC 0  MEWS Score 5  MEWS Score Color Red  Take Vital Signs  Increase Vital Sign Frequency  Red: Q 1hr X 4 then Q 4hr X 4, if remains red, continue Q 4hrs  Escalate  MEWS: Escalate Red: discuss with charge nurse/RN and provider, consider discussing with RRT  Notify: Charge Nurse/RN  Name of Charge Nurse/RN Notified Olivia  Date Charge Nurse/RN Notified 01/19/20  Time Charge Nurse/RN Notified 0805  Notify: Provider  Provider Name/Title Enzo Bi  Date Provider Notified 01/19/20  Time Provider Notified [phone]  Notification Type Page  Notification Reason Change in status  Response See new orders  Date of Provider Response 01/19/20  Time of Provider Response 0815  Notify: Rapid Response  Name of Rapid Response RN Notified Charlie  Date Rapid Response Notified 01/19/20  Time Rapid Response Notified 5809  Document  Patient Outcome Stabilized after interventions  Progress note created (see row info) Yes

## 2020-01-20 DIAGNOSIS — L8990 Pressure ulcer of unspecified site, unspecified stage: Secondary | ICD-10-CM | POA: Insufficient documentation

## 2020-01-20 LAB — GLUCOSE, CAPILLARY
Glucose-Capillary: 112 mg/dL — ABNORMAL HIGH (ref 70–99)
Glucose-Capillary: 134 mg/dL — ABNORMAL HIGH (ref 70–99)
Glucose-Capillary: 211 mg/dL — ABNORMAL HIGH (ref 70–99)
Glucose-Capillary: 135 mg/dL — ABNORMAL HIGH (ref 70–99)
Glucose-Capillary: 151 mg/dL — ABNORMAL HIGH (ref 70–99)

## 2020-01-20 LAB — BASIC METABOLIC PANEL
Anion gap: 8 (ref 5–15)
BUN: 43 mg/dL — ABNORMAL HIGH (ref 8–23)
CO2: 19 mmol/L — ABNORMAL LOW (ref 22–32)
Calcium: 8.1 mg/dL — ABNORMAL LOW (ref 8.9–10.3)
Chloride: 115 mmol/L — ABNORMAL HIGH (ref 98–111)
Creatinine, Ser: 1.7 mg/dL — ABNORMAL HIGH (ref 0.61–1.24)
GFR calc Af Amer: 47 mL/min — ABNORMAL LOW (ref >60)
GFR calc non Af Amer: 41 mL/min — ABNORMAL LOW (ref >60)
Glucose, Bld: 127 mg/dL — ABNORMAL HIGH (ref 70–99)
Potassium: 4.1 mmol/L (ref 3.5–5.1)
Sodium: 142 mmol/L (ref 135–145)

## 2020-01-20 LAB — CBC
HCT: 31.6 % — ABNORMAL LOW (ref 39.0–52.0)
Hemoglobin: 9.9 g/dL — ABNORMAL LOW (ref 13.0–17.0)
MCH: 28.5 pg (ref 26.0–34.0)
MCHC: 31.3 g/dL (ref 30.0–36.0)
MCV: 91.1 fL (ref 80.0–100.0)
Platelets: 116 10*3/uL — ABNORMAL LOW (ref 150–400)
RBC: 3.47 MIL/uL — ABNORMAL LOW (ref 4.22–5.81)
RDW: 15.9 % — ABNORMAL HIGH (ref 11.5–15.5)
WBC: 12.9 10*3/uL — ABNORMAL HIGH (ref 4.0–10.5)
nRBC: 0 % (ref 0.0–0.2)

## 2020-01-20 LAB — MAGNESIUM: Magnesium: 2.3 mg/dL (ref 1.7–2.4)

## 2020-01-20 MED ORDER — SODIUM CHLORIDE 0.9 % IV SOLN: INTRAVENOUS | Status: AC

## 2020-01-20 NOTE — Care Management Important Message (Signed)
Important Message  Patient Details  Name: Jason Montes MRN: [id number] Date of Birth: 1951-02-13   Medicare Important Message Given:  Yes     Dannette Barbara 01/20/2020, 12:40 PM

## 2020-01-20 NOTE — Progress Notes (Signed)
SUBJECTIVE: More talkative and responsive today  Vitals:   01/20/20 0426 01/20/20 0500 01/20/20 0755 01/20/20 1224  BP: (!) 98/57  (!) 98/54 (!) 100/59  Pulse: 82  89 86  Resp: 20  (!) 24 (!) 24  Temp: 98 F (36.7 C)  97.9 F (36.6 C) 97.7 F (36.5 C)  TempSrc: Oral  Oral Oral  SpO2: 97%  93% 96%  Weight:  78.5 kg      Intake/Output Summary (Last 24 hours) at 01/20/2020 1410 Last data filed at 01/20/2020 1219 Gross per 24 hour  Intake 1957.04 ml  Output 1325 ml  Net 632.04 ml    LABS: Basic Metabolic Panel: Recent Labs    01/19/20 0457 01/20/20 0559  NA 144 142  K 3.3* 4.1  CL 109 115*  CO2 22 19*  GLUCOSE 185* 127*  BUN 54* 43*  CREATININE 1.98* 1.70*  CALCIUM 8.5* 8.1*  MG 2.5* 2.3   Liver Function Tests: No results for input(s): AST, ALT, ALKPHOS, BILITOT, PROT, ALBUMIN in the last 72 hours. No results for input(s): LIPASE, AMYLASE in the last 72 hours. CBC: Recent Labs    01/18/20 0222 01/18/20 0222 01/19/20 0457 01/20/20 0559  WBC 13.1*   < > 12.5* 12.9*  NEUTROABS 9.4*  --   --   --   HGB 11.0*   < > 10.7* 9.9*  HCT 34.4*   < > 34.5* 31.6*  MCV 88.9   < > 92.5 91.1  PLT 172   < > 118* 116*   < > = values in this interval not displayed.   Cardiac Enzymes: No results for input(s): CKTOTAL, CKMB, CKMBINDEX, TROPONINI in the last 72 hours. BNP: Invalid input(s): POCBNP D-Dimer: No results for input(s): DDIMER in the last 72 hours. Hemoglobin A1C: Recent Labs    01/17/20 2246  HGBA1C 7.9*   Fasting Lipid Panel: No results for input(s): CHOL, HDL, LDLCALC, TRIG, CHOLHDL, LDLDIRECT in the last 72 hours. Thyroid Function Tests: No results for input(s): TSH, T4TOTAL, T3FREE, THYROIDAB in the last 72 hours.  Invalid input(s): FREET3 Anemia Panel: No results for input(s): VITAMINB12, FOLATE, FERRITIN, TIBC, IRON, RETICCTPCT in the last 72 hours.   PHYSICAL EXAM General: Well developed, well nourished, in no acute distress HEENT:   Normocephalic and atramatic Neck:  No JVD.  Lungs: Clear bilaterally to auscultation and percussion. Heart: HRRR . Normal S1 and S2 without gallops or murmurs.  Abdomen: Bowel sounds are positive, abdomen soft and non-tender  Msk:  Back normal, normal gait. Normal strength and tone for age. Extremities: No clubbing, cyanosis or edema.   Neuro: Alert and oriented X 3. Psych:  Good affect, responds appropriately  TELEMETRY:Not on monitoe  ASSESSMENT AND PLAN: Ventricular tachycardia non sustained, continue amiodrone.  Principal Problem:   Acute metabolic encephalopathy Active Problems:   Atrial fibrillation (HCC)   CAD (coronary artery disease)   GERD with esophagitis   Chronic systolic CHF (congestive heart failure) (HCC)   Hyperlipidemia   Essential hypertension   Acute renal failure superimposed on stage 3a chronic kidney disease (HCC)   Stage 3a chronic kidney disease   Osteomyelitis of great toe of left foot (HCC)   Type II diabetes mellitus with renal manifestations ([place])   Stroke ([place])   Depression   Suicidal behavior   Pressure injury of skin    Adalie Mand A, MD, [facility] 01/20/2020 2:10 PM

## 2020-01-20 NOTE — Progress Notes (Signed)
Physical Therapy Treatment Patient Details Name: Jason Montes MRN: [id number] DOB: 08-28-1950 Today's Date: 01/20/2020    History of Present Illness Jason Montes is a 22yoM who [facility] c AMS, overdose. Patientseen by podiatry as he was scheduled for procedure for partial left first ray amputation on 8/13.  Patient did not have cardiology clearance and needed it prior to surgery. Pt underwent Lt hallux amputation on 8/13, revascularization procedureon 8/16. PMH: HTN, HLD, DM, CVA, GERD, depression, RLS, PVD, CAD, CKD-3, AF on Eliquis, chronic Lt hallux osteomyelitis, sCHF with EF of 40-45%    PT Comments    Pt was in bed with sitter and RN in room upon arriving. He agrees to PT session and is cooperative. Pt is alert throughout but only oriented to self. Resting BP 110/68 that dropped to 72/53 upon sitting up EOB. Pt does endorse dizziness but able to stay sitting for a few minutes prior to needing to return to supine. Performed bed level exercises and issued there ex handout. Pt will benefit from increased upright positioning in bed. Will return next date to progress OOB activity if BP/dizziness resolves. At conclusion of session, pt was in bed with call bell in reach and sitter present.     Follow Up Recommendations  Supervision/Assistance - 24 hour;Supervision - Intermittent;Supervision for mobility/OOB     Equipment Recommendations  None recommended by PT    Recommendations for Other Services       Precautions / Restrictions Precautions Precautions: Fall Precaution Comments: Suicide precautions Required Braces or Orthoses: Other Brace (darco shoe) Other Brace: postop shoe Restrictions Weight Bearing Restrictions: Yes LLE Weight Bearing: Partial weight bearing    Mobility  Bed Mobility Overal bed mobility: Needs Assistance Bed Mobility: Supine to Sit;Sit to Supine     Supine to sit: Mod assist Sit to supine: Mod assist   General bed mobility  comments: Increased time to perform with mod assist  Transfers                 General transfer comment: did not attempt 2/2 to BP/orthostatic BP  Ambulation/Gait                 Stairs             Wheelchair Mobility    Modified Rankin (Stroke Patients Only)       Balance Overall balance assessment: Needs assistance Sitting-balance support: Bilateral upper extremity supported;Feet unsupported Sitting balance-Leahy Scale: Poor Sitting balance - Comments: posterior LOB in sitting                                     Cognition Arousal/Alertness: Awake/alert Behavior During Therapy: WFL for tasks assessed/performed Overall Cognitive Status: History of cognitive impairments - at baseline                                 General Comments: Pt is A but disoriented to time/setting/situation. oriented to self      Exercises      General Comments        Pertinent Vitals/Pain Pain Assessment: No/denies pain    Home Living                      Prior Function            PT Goals (current  goals can now be found in the care plan section) Acute Rehab PT Goals Patient Stated Goal: none stated Progress towards PT goals: Progressing toward goals    Frequency    7X/week      PT Plan Current plan remains appropriate    Co-evaluation              AM-PAC PT "6 Clicks" Mobility   Outcome Measure  Help needed turning from your back to your side while in a flat bed without using bedrails?: A Lot Help needed moving from lying on your back to sitting on the side of a flat bed without using bedrails?: A Lot Help needed moving to and from a bed to a chair (including a wheelchair)?: A Lot Help needed standing up from a chair using your arms (e.g., wheelchair or bedside chair)?: A Lot Help needed to walk in hospital room?: A Lot Help needed climbing 3-5 steps with a railing? : A Lot 6 Click Score: 12    End  of Session   Activity Tolerance: Patient tolerated treatment well Patient left: in bed;with call bell/phone within reach;with bed alarm set Nurse Communication: Mobility status PT Visit Diagnosis: Difficulty in walking, not elsewhere classified (R26.2);Unsteadiness on feet (R26.81);Other abnormalities of gait and mobility (R26.89);Muscle weakness (generalized) (M62.81);Other symptoms and signs involving the nervous system (R29.898)     Time: 1415-1430 PT Time Calculation (min) (ACUTE ONLY): 15 min  Charges:  $Therapeutic Activity: 8-22 mins                     [name] Fusi PTA 01/20/20, 5:21 PM

## 2020-01-20 NOTE — Progress Notes (Signed)
PROGRESS NOTE    Jason Montes  KGM:[id number] DOB: 09-16-1950 DOA: 01/11/2020 PCP: [facility]   Chief complaint.  Altered mental status.  Brief Narrative:  Jason Montes a 69 y.o.malewith medical history significant ofhypertension, hyperlipidemia, diabetes mellitus, stroke, GERD, depression, RLS, PVD, CAD, CKD-3, atrial fibrillation on Eliquis, chronic left greater toe osteomyelitis,sCHF with EF of 40-45%, who presents with altered mental status and overdose ?unintentional.  8/14. Patient still has significant confusion today. Worsening renal function, start IV fluids. Restart lower dose Neurontin.Started antibiotics with vancomycin and meropenem for elevated procalcitonin level 4.04.Foley catheter was anchored for residual over 1370 ml.   8/16.  Patient condition much improved.  Not significant confusion.  Continue antibiotics.  Left foot angiography and angioplasty of left anterior tibial artery and the left peroneal artery by Dr. Lucky Cowboy.  8/17.  Patient developed a fever again while on antibiotics, chest x-ray showed bilateral lower lobe pneumonia.   Assessment & Plan:   Principal Problem:   Acute metabolic encephalopathy Active Problems:   Atrial fibrillation (HCC)   CAD (coronary artery disease)   GERD with esophagitis   Chronic systolic CHF (congestive heart failure) (HCC)   Hyperlipidemia   Essential hypertension   Acute renal failure superimposed on stage 3a chronic kidney disease (HCC)   Stage 3a chronic kidney disease   Osteomyelitis of great toe of left foot (HCC)   Type II diabetes mellitus with renal manifestations (HCC)   Stroke (HCC)   Depression   Suicidal behavior   Pressure injury of skin  Hypotension --systolic 64'Q morning of 0/34.  Improved with IVF 500 ml. --Hold MIVF since pt is now alert and taking in food/drink. --Hold coreg and Lasix  1.  Acute metabolic encephalopathy. Appear to be secondary to acute  infection. Condition gradually improving.  He was sleepy, he received some Ativan early this morning.  Discontinue Ativan, continue as needed Haldol. There was some concern about suicide ideation, patient has been evaluated by psychiatry.  Patient is too sleepy to have thorough evaluation.  Need to consult psychiatry again when patient mental status improves.  #2.  Osteomyelitis of the left left great toe s/p amputation Developed a fever again on 8/18.  assumed to be secondary to aspiration pneumonia.  Blood culture sent out. --complete IV abx course today  3.  Bilateral lower lobe aspiration pneumonia. Probably secondary to altered mental status.  Do not believe patient has an dysphagia.   --complete IV abx course today  4.  Urinary retention secondary to benign prostate hypertrophy. Patient had significant urinary tension with residual over 1370 mL.   --continue Foley --continue Flomax  5.  Acute kidney injury on chronic kidney disease stage IIIa. Renal function improving.    6.  Hypernatremia. Patient received normal saline instead of lactated Ringer's solution during the surgery.    7.  Slight thrombocytopenia. Likely secondary to infection.   --monitor  8.  Paroxysmal atrial fibrillation. Ventricular tachycardia non sustained Currently in sinus.   Continue Eliquis --continue amiodarone  9.  Chronic systolic congestive heart failure with moderate pulmonary hypertension. No evidence of exacerbation.    10.  Uncontrolled type 2 diabetes with hyperglycemia. --continue Lantus 12u daily  11.  Nonsustained ventricular tachycardia. --treat underlying cause, per cards --d/c tele, per cards --continue amiodarone   DVT prophylaxis: VQ:QVZDGLO Code Status: DNR  Family Communication:  Status is: inpatient Dispo:   The patient is from: boarding house Anticipated d/c is to: undetermined Anticipated d/c date is: undetermined  Patient currently is not medically stable to d/c  due to: significant debility, no safe discharge disposition   Consultants:  Podiatry. Psychiatry   Procedures:  Left hallux amputation    Subjective: Pt has become more alert and oriented.     Objective: Vitals:   01/20/20 0755 01/20/20 1224 01/20/20 1552 01/20/20 1601  BP: (!) 98/54 (!) 100/59  100/63  Pulse: 89 86  87  Resp: (!) 24 (!) 24  (!) 24  Temp: 97.9 F (36.6 C) 97.7 F (36.5 C)  98.3 F (36.8 C)  TempSrc: Oral Oral  Oral  SpO2: 93% 96% 95% 97%  Weight:        Intake/Output Summary (Last 24 hours) at 01/20/2020 1836 Last data filed at 01/20/2020 1500 Gross per 24 hour  Intake 1703.41 ml  Output 700 ml  Net 1003.41 ml   Filed Weights   01/17/20 2100 01/19/20 0500 01/20/20 0500  Weight: 73.7 kg 75.5 kg 78.5 kg    Examination:  Constitutional: NAD, alert and oriented to self and place, much more coherent today HEENT: conjunctivae and lids normal, EOMI CV: RRR no M,R,G. Distal pulses +2.  No cyanosis.   RESP: CTA B/L, normal respiratory effort, on RA GI: +BS, NTND Extremities: No effusions, edema, or tenderness in lower legs.  Left great toe amputation wrapped.  Amputation of all right toes. SKIN: warm, dry and intact Neuro: II - XII grossly intact.  Sensation intact    Data Reviewed: I have personally reviewed following labs and imaging studies  CBC: Recent Labs  Lab 01/15/20 0718 01/15/20 0718 01/16/20 0502 01/17/20 0513 01/18/20 0222 01/19/20 0457 01/20/20 0559  WBC 11.2*   < > 10.5 9.7 13.1* 12.5* 12.9*  NEUTROABS 9.9*  --  9.3* 7.3 9.4*  --   --   HGB 11.3*   < > 13.3 11.6* 11.0* 10.7* 9.9*  HCT 34.4*   < > 42.5 36.2* 34.4* 34.5* 31.6*  MCV 88.0   < > 92.0 89.6 88.9 92.5 91.1  PLT 140*   < > PLATELET CLUMPING, SUGGEST RECOLLECTION OF SAMPLE IN CITRATE TUBE. 144* 172 118* 116*   < > = values in this interval not displayed.   Basic Metabolic Panel: Recent Labs  Lab 01/16/20 0502 01/16/20 0502 01/17/20 0513 01/17/20 2246  01/18/20 0222 01/19/20 0457 01/20/20 0559  NA 145   < > 150* 150* 151* 144 142  K 5.1   < > 3.7 3.4* 3.4* 3.3* 4.1  CL 109   < > 119* 118* 118* 109 115*  CO2 19*   < > 21* 21* 18* 22 19*  GLUCOSE 234*   < > 213* 220* 183* 185* 127*  BUN 87*   < > 73* 62* 61* 54* 43*  CREATININE 2.48*   < > 1.75* 1.73* 1.71* 1.98* 1.70*  CALCIUM 9.8   < > 9.1 9.2 9.4 8.5* 8.1*  MG 2.9*  --  2.9*  --  2.9* 2.5* 2.3   < > = values in this interval not displayed.   GFR: Estimated Creatinine Clearance: 42.2 mL/min (A) (by C-G formula based on SCr of 1.7 mg/dL (H)). Liver Function Tests: No results for input(s): AST, ALT, ALKPHOS, BILITOT, PROT, ALBUMIN in the last 168 hours. No results for input(s): LIPASE, AMYLASE in the last 168 hours. Recent Labs  Lab 01/14/20 0945  AMMONIA 14   Coagulation Profile: No results for input(s): INR, PROTIME in the last 168 hours. Cardiac Enzymes: No results for input(s): CKTOTAL, CKMB,  CKMBINDEX, TROPONINI in the last 168 hours. BNP (last 3 results) No results for input(s): PROBNP in the last 8760 hours. HbA1C: Recent Labs    01/17/20 2246  HGBA1C 7.9*   CBG: Recent Labs  Lab 01/19/20 2354 01/20/20 0416 01/20/20 0750 01/20/20 1151 01/20/20 1652  GLUCAP 142* 112* 134* 211* 135*   Lipid Profile: No results for input(s): CHOL, HDL, LDLCALC, TRIG, CHOLHDL, LDLDIRECT in the last 72 hours. Thyroid Function Tests: No results for input(s): TSH, T4TOTAL, FREET4, T3FREE, THYROIDAB in the last 72 hours. Anemia Panel: No results for input(s): VITAMINB12, FOLATE, FERRITIN, TIBC, IRON, RETICCTPCT in the last 72 hours. Sepsis Labs: Recent Labs  Lab 01/14/20 0945 01/18/20 0222  PROCALCITON 4.04 0.80    Recent Results (from the past 240 hour(s))  SARS Coronavirus 2 by RT PCR (hospital order, performed in [facility] hospital lab) Nasopharyngeal Nasopharyngeal Swab     Status: None   Collection Time: 01/11/20  6:55 AM   Specimen: Nasopharyngeal Swab  Result  Value Ref Range Status   SARS Coronavirus 2 NEGATIVE NEGATIVE Final    Comment: (NOTE) SARS-CoV-2 target nucleic acids are NOT DETECTED.  The SARS-CoV-2 RNA is generally detectable in upper and lower respiratory specimens during the acute phase of infection. The lowest concentration of SARS-CoV-2 viral copies this assay can detect is 250 copies / mL. A negative result does not preclude SARS-CoV-2 infection and should not be used as the sole basis for treatment or other patient management decisions.  A negative result may occur with improper specimen collection / handling, submission of specimen other than nasopharyngeal swab, presence of viral mutation(s) within the areas targeted by this assay, and inadequate number of viral copies (<250 copies / mL). A negative result must be combined with clinical observations, patient history, and epidemiological information.  Fact Sheet for Patients:   StrictlyIdeas.no  Fact Sheet for Healthcare Providers: BankingDealers.co.za  This test is not yet approved or  cleared by the [place] FDA and has been authorized for detection and/or diagnosis of SARS-CoV-2 by FDA under an Emergency Use Authorization (EUA).  This EUA will remain in effect (meaning this test can be used) for the duration of the COVID-19 declaration under Section 564(b)(1) of the Act, 21 U.S.C. section 360bbb-3(b)(1), unless the authorization is terminated or revoked sooner.  Performed at [facility], [address] [place], [place] [address]   CULTURE, BLOOD (ROUTINE X 2) w Reflex to ID Panel     Status: None   Collection Time: 01/11/20 12:35 PM   Specimen: BLOOD  Result Value Ref Range Status   Specimen Description BLOOD BLOOD LEFT HAND  Final   Special Requests   Final    BOTTLES DRAWN AEROBIC AND ANAEROBIC Blood Culture adequate volume   Culture   Final    NO GROWTH 5 DAYS Performed at [facility], [address] [place], [place] [address]    Report Status 01/16/2020 FINAL  Final  CULTURE, BLOOD (ROUTINE X 2) w Reflex to ID Panel     Status: None   Collection Time: 01/11/20 12:35 PM   Specimen: BLOOD  Result Value Ref Range Status   Specimen Description BLOOD RIGHT ANTECUBITAL  Final   Special Requests   Final    BOTTLES DRAWN AEROBIC AND ANAEROBIC Blood Culture adequate volume   Culture   Final    NO GROWTH 5 DAYS Performed at [facility], [address], [place], [place] [address]    Report Status  01/16/2020 FINAL  Final  MRSA PCR Screening     Status: None   Collection Time: 01/16/20  9:57 AM   Specimen: Nasal Mucosa; Nasopharyngeal  Result Value Ref Range Status   MRSA by PCR NEGATIVE NEGATIVE Final    Comment:        The GeneXpert MRSA Assay (FDA approved for NASAL specimens only), is one component of a comprehensive MRSA colonization surveillance program. It is not intended to diagnose MRSA infection nor to guide or monitor treatment for MRSA infections. Performed at [facility], [address] [place], [place] [address]   CULTURE, BLOOD (ROUTINE X 2) w Reflex to ID Panel     Status: None (Preliminary result)   Collection Time: 01/17/20  9:02 AM   Specimen: BLOOD  Result Value Ref Range Status   Specimen Description BLOOD RIGHT [facility]  Final   Special Requests   Final    BOTTLES DRAWN AEROBIC AND ANAEROBIC Blood Culture adequate volume   Culture   Final    NO GROWTH 3 DAYS Performed at [facility], [address] [place], [place] [address]    Report Status PENDING  Incomplete  CULTURE, BLOOD (ROUTINE X 2) w Reflex to ID Panel     Status: None (Preliminary result)   Collection Time: 01/17/20  9:10 AM   Specimen: BLOOD  Result Value Ref Range Status   Specimen Description BLOOD LEFT AC  Final   Special Requests   Final    BOTTLES DRAWN AEROBIC AND ANAEROBIC Blood Culture adequate volume   Culture   Final    NO GROWTH  3 DAYS Performed at [facility], [address], [place], [place] [address]    Report Status PENDING  Incomplete         Radiology Studies: No results found.      Scheduled Meds: . amiodarone  400 mg Oral BID  . apixaban  5 mg Oral BID  . aspirin EC  81 mg Oral Daily  . atorvastatin  40 mg Oral QHS  . Chlorhexidine Gluconate Cloth  6 each Topical Daily  . cholecalciferol  1,000 Units Oral Daily  . clopidogrel  75 mg Oral Daily  . diphenhydrAMINE  50 mg Oral QHS  . famotidine  20 mg Oral Daily  . fluticasone  2 spray Each Nare Daily  . gabapentin  200 mg Oral BID  . haloperidol  0.5 mg Oral BID  . insulin aspart  0-15 Units Subcutaneous Q4H  . insulin glargine  12 Units Subcutaneous Daily  . metroNIDAZOLE  500 mg Oral Q8H  . OLANZapine zydis  10 mg Oral QHS  . senna-docusate  2 tablet Oral BID  . tamsulosin  0.4 mg Oral Daily   Continuous Infusions: . sodium chloride Stopped (01/15/20 0913)  . ceFEPime (MAXIPIME) IV Stopped (01/20/20 1057)     LOS: 8 days     Enzo Bi, MD [facility]   To contact the attending provider between 7A-7P or the covering provider during after hours 7P-7A, please log into the web site [email or web address] and access using universal [place] password for that web site. If you do not have the password, please call the hospital operator.  01/20/2020, 6:36 PM

## 2020-01-20 NOTE — Progress Notes (Addendum)
This nurse prepared patients medications by crushing and mixing in applesauce patient ate a few bites but did not finish taking medications.  Second attempt successful at giving meds.

## 2020-01-21 LAB — BASIC METABOLIC PANEL
Anion gap: 8 (ref 5–15)
BUN: 32 mg/dL — ABNORMAL HIGH (ref 8–23)
CO2: 21 mmol/L — ABNORMAL LOW (ref 22–32)
Calcium: 8 mg/dL — ABNORMAL LOW (ref 8.9–10.3)
Chloride: 112 mmol/L — ABNORMAL HIGH (ref 98–111)
Creatinine, Ser: 1.41 mg/dL — ABNORMAL HIGH (ref 0.61–1.24)
GFR calc Af Amer: 59 mL/min — ABNORMAL LOW (ref >60)
GFR calc non Af Amer: 51 mL/min — ABNORMAL LOW (ref >60)
Glucose, Bld: 112 mg/dL — ABNORMAL HIGH (ref 70–99)
Potassium: 3.7 mmol/L (ref 3.5–5.1)
Sodium: 141 mmol/L (ref 135–145)

## 2020-01-21 LAB — MAGNESIUM: Magnesium: 2.2 mg/dL (ref 1.7–2.4)

## 2020-01-21 LAB — CBC
HCT: 27.9 % — ABNORMAL LOW (ref 39.0–52.0)
Hemoglobin: 9.2 g/dL — ABNORMAL LOW (ref 13.0–17.0)
MCH: 29.1 pg (ref 26.0–34.0)
MCHC: 33 g/dL (ref 30.0–36.0)
MCV: 88.3 fL (ref 80.0–100.0)
RBC: 3.16 MIL/uL — ABNORMAL LOW (ref 4.22–5.81)
RDW: 15.7 % — ABNORMAL HIGH (ref 11.5–15.5)
WBC: 10.3 10*3/uL (ref 4.0–10.5)
nRBC: 0 % (ref 0.0–0.2)

## 2020-01-21 LAB — GLUCOSE, CAPILLARY
Glucose-Capillary: 102 mg/dL — ABNORMAL HIGH (ref 70–99)
Glucose-Capillary: 113 mg/dL — ABNORMAL HIGH (ref 70–99)
Glucose-Capillary: 118 mg/dL — ABNORMAL HIGH (ref 70–99)
Glucose-Capillary: 155 mg/dL — ABNORMAL HIGH (ref 70–99)
Glucose-Capillary: 165 mg/dL — ABNORMAL HIGH (ref 70–99)
Glucose-Capillary: 95 mg/dL (ref 70–99)

## 2020-01-21 MED ORDER — POLYETHYLENE GLYCOL 3350 17 G PO PACK
34.0000 g | PACK | ORAL | Status: AC
  Administered 2020-01-21 – 2020-01-22 (×5): 34 g via ORAL
  Filled 2020-01-21 (×5): qty 2

## 2020-01-21 NOTE — Progress Notes (Signed)
Physical Therapy Treatment Patient Details Name: Jason Montes MRN: [id number] DOB: 18-Aug-1950 Today's Date: 01/21/2020    History of Present Illness Jason Montes is a 44yoM who [facility] 8/11 c AMS, overdose. Patientseen by podiatry as he was scheduled for procedure for partial left first ray amputation on 8/13.  Patient did not have cardiology clearance and needed it prior to surgery. Pt underwent Lt hallux amputation on 8/13, revascularization procedureon 8/16. PMH: HTN, HLD, DM, CVA, GERD, depression, RLS, PVD, CAD, CKD-3, AF on Eliquis, chronic Lt hallux osteomyelitis, sCHF with EF of 40-45%    PT Comments    Pt was supine in bed with HOB elevated ~ 30 degrees and sitter present. He is alert and cooperative however disoriented. Only oriented to self but does follow commands consistently and is talkative/cooperative throughout.  Resting BP 108/64. He does not endorse dizziness or lightheadedness throughout today's session. Required mod assist to exit bed. Also required mod A to re-enter bed after EOB activity.  He has posterior push present in sitting and upon standing.BP upon sitting up 106/57. Unable to recall wt bearing precautions however due to posterior push does limit wt on LLE forefoot. Post op shoe worn throughout session. pt stood 3 x EOB however unable to progress balance or OOB activity due to safety and pt having mucous/loose stools. Mod assist to stand to RW from elevated bed height.  Pt does endorse fatigue but overall tolerated session well. He will benefit from continued skilled PT to address deficits and improve safe functional mobility.     Follow Up Recommendations  Supervision/Assistance - 24 hour;Supervision for mobility/OOB;Other (comment) (unable to go to SNF. Will benefit from extensive PT )     Equipment Recommendations  None recommended by PT    Recommendations for Other Services       Precautions / Restrictions Precautions Precautions:  Fall Precaution Comments: Suicide precautions Required Braces or Orthoses: Other Brace Other Brace: postop shoe Restrictions Weight Bearing Restrictions: Yes LLE Weight Bearing: Partial weight bearing LLE Partial Weight Bearing Percentage or Pounds: Wt bearing on L heel only with post op shoe     Mobility  Bed Mobility Overal bed mobility: Needs Assistance Bed Mobility: Supine to Sit;Sit to Supine     Supine to sit: Mod assist Sit to supine: Mod assist   General bed mobility comments: General weakness throughout. slight tremors with movements but improved form previously seen. He requires incraesed time with all movements. mod assist to achieve EOB sitting and mod assist to return to supine after EOB/standing  Transfers Overall transfer level: Needs assistance Equipment used: Rolling walker (2 wheeled) Transfers: Sit to/from Stand Sit to Stand: From elevated surface;Mod assist         General transfer comment: Pt has severe posterior push with sit to stand EOB. +1 assist but pt has sitter. Pt has difficulty standing prolonged periods due to posterior push + fatigue. static stood EOB ~ 2 minutes each trial with vsc throughout for safety and proper wt bearing.   Ambulation/Gait             General Gait Details: unsafe to progress away from bed 2/2 to severe posterior push and tremors upon standing. no dizziness or lightheadedness throughout session .    Stairs             Wheelchair Mobility    Modified Rankin (Stroke Patients Only)       Balance Overall balance assessment: Needs assistance Sitting-balance support: Bilateral upper  extremity supported;Feet supported Sitting balance-Leahy Scale: Poor Sitting balance - Comments: posterior LOB in sitting    Standing balance support: Bilateral upper extremity supported;During functional activity Standing balance-Leahy Scale: Poor Standing balance comment: severe posterior push requiring constant assistance to  prevent fall posteriorly.                            Cognition Arousal/Alertness: Awake/alert Behavior During Therapy: WFL for tasks assessed/performed Overall Cognitive Status: History of cognitive impairments - at baseline Area of Impairment: Orientation;Memory;Awareness;Safety/judgement;Problem solving                               General Comments: Pt is oriented to self only. UNable to correctly state location, time,day, or reason for being in hospital. He is alert and talkative throughout. easily distracted but pleasant and motivated.       Exercises      General Comments        Pertinent Vitals/Pain Pain Assessment: No/denies pain    Home Living                      Prior Function            PT Goals (current goals can now be found in the care plan section) Acute Rehab PT Goals Patient Stated Goal: none stated Progress towards PT goals: Progressing toward goals    Frequency    7X/week      PT Plan Current plan remains appropriate    Co-evaluation              AM-PAC PT "6 Clicks" Mobility   Outcome Measure  Help needed turning from your back to your side while in a flat bed without using bedrails?: A Lot Help needed moving from lying on your back to sitting on the side of a flat bed without using bedrails?: A Lot Help needed moving to and from a bed to a chair (including a wheelchair)?: A Lot Help needed standing up from a chair using your arms (e.g., wheelchair or bedside chair)?: A Lot Help needed to walk in hospital room?: A Lot Help needed climbing 3-5 steps with a railing? : A Lot 6 Click Score: 12    End of Session Equipment Utilized During Treatment: Gait belt;Oxygen (pt on 2 L O2 throughout sesison) Activity Tolerance: Patient tolerated treatment well Patient left: in bed;with call bell/phone within reach;with bed alarm set;with nursing/sitter in room;with SCD's reapplied Nurse Communication: Mobility  status PT Visit Diagnosis: Difficulty in walking, not elsewhere classified (R26.2);Unsteadiness on feet (R26.81);Other abnormalities of gait and mobility (R26.89);Muscle weakness (generalized) (M62.81);Other symptoms and signs involving the nervous system (R29.898)     Time: 7106-2694 PT Time Calculation (min) (ACUTE ONLY): 30 min  Charges:  $Therapeutic Activity: 23-37 mins                     [name] Fusi PTA 01/21/20, 10:35 AM

## 2020-01-21 NOTE — Progress Notes (Signed)
PROGRESS NOTE    Jason Montes  ASN:[id number] DOB: 11/20/1950 DOA: 01/11/2020 PCP: [facility]   Chief complaint.  Altered mental status.  Brief Narrative:  Jason Montes a 69 y.o.malewith medical history significant ofhypertension, hyperlipidemia, diabetes mellitus, stroke, GERD, depression, RLS, PVD, CAD, CKD-3, atrial fibrillation on Eliquis, chronic left greater toe osteomyelitis,sCHF with EF of 40-45%, who presents with altered mental status and overdose ?unintentional.  8/14. Patient still has significant confusion today. Worsening renal function, start IV fluids. Restart lower dose Neurontin.Started antibiotics with vancomycin and meropenem for elevated procalcitonin level 4.04.Foley catheter was anchored for residual over 1370 ml.   8/16.  Patient condition much improved.  Not significant confusion.  Continue antibiotics.  Left foot angiography and angioplasty of left anterior tibial artery and the left peroneal artery by Dr. Lucky Cowboy.  8/17.  Patient developed a fever again while on antibiotics, chest x-ray showed bilateral lower lobe pneumonia.   Assessment & Plan:   Principal Problem:   Acute metabolic encephalopathy Active Problems:   Atrial fibrillation (HCC)   CAD (coronary artery disease)   GERD with esophagitis   Chronic systolic CHF (congestive heart failure) (HCC)   Hyperlipidemia   Essential hypertension   Acute renal failure superimposed on stage 3a chronic kidney disease (HCC)   Stage 3a chronic kidney disease   Osteomyelitis of great toe of left foot (HCC)   Type II diabetes mellitus with renal manifestations (HCC)   Stroke (HCC)   Depression   Suicidal behavior   Pressure injury of skin  Hypotension --systolic 19'F morning of 7/90.  Improved with IVF 500 ml. --BP improved but still low normal PLAN: --Hold MIVF since pt is now alert and taking in food/drink. --continue to hold home coreg and Lasix  1.  Acute  metabolic encephalopathy. Appear to be secondary to acute infection. Condition gradually improving.  He was sleepy, he received some Ativan early this morning.  Discontinue Ativan, continue as needed Haldol. There was some concern about suicide ideation, patient has been evaluated by psychiatry.  Patient was too sleepy to have thorough evaluation.   --Mental status now much improved --re-consult psych   #2.  Osteomyelitis of the left left great toe s/p amputation Developed a fever again on 8/18.  assumed to be secondary to aspiration pneumonia.  Blood culture sent out. --completed IV abx course  3.  Bilateral lower lobe aspiration pneumonia. Probably secondary to altered mental status.  Do not believe patient has an dysphagia.   --completed IV abx course  4.  Urinary retention secondary to benign prostate hypertrophy. Patient had significant urinary tension with residual over 1370 mL.  Foley was inserted. PLAN: --remove Foley today and order bladder scan for voiding trial --continue Flomax  5.  Acute kidney injury on chronic kidney disease stage IIIa. Renal function improving.    6.  Hypernatremia. Patient received normal saline instead of lactated Ringer's solution during the surgery.    7.  Slight thrombocytopenia. Likely secondary to infection.   --monitor  8.  Paroxysmal atrial fibrillation. Ventricular tachycardia non sustained Currently in sinus.   Continue Eliquis --continue amiodarone  9.  Chronic systolic congestive heart failure with moderate pulmonary hypertension. No evidence of exacerbation.    10.  Uncontrolled type 2 diabetes with hyperglycemia. --continue Lantus 12u daily  11.  Nonsustained ventricular tachycardia. --treat underlying cause, per cards --no need for tele, per cards --continue amiodarone  Constipation --High-dose Miralax today   DVT prophylaxis: WI:[id number] Code Status: DNR  Family Communication:  Status is: inpatient Dispo:   The  patient is from: boarding house Anticipated d/c is to: undetermined Anticipated d/c date is: undetermined Patient currently is not medically stable to d/c due to: significant debility, no safe discharge disposition   Consultants:  Podiatry. Psychiatry   Procedures:  Left hallux amputation    Subjective: Pt now remained alert and awake most of the time.  Still confused sometimes.  Hasn't had BM, per nursing.     Objective: Vitals:   01/21/20 0404 01/21/20 0429 01/21/20 0822 01/21/20 1715  BP: 106/64  (!) 108/58 112/69  Pulse: 87  82 85  Resp: 20  16 16   Temp: (!) 97.3 F (36.3 C)  98 F (36.7 C) 98.7 F (37.1 C)  TempSrc: Oral     SpO2: 95%  98% 100%  Weight:  76.7 kg      Intake/Output Summary (Last 24 hours) at 01/21/2020 1857 Last data filed at 01/21/2020 1815 Gross per 24 hour  Intake 580 ml  Output 1700 ml  Net -1120 ml   Filed Weights   01/19/20 0500 01/20/20 0500 01/21/20 0429  Weight: 75.5 kg 78.5 kg 76.7 kg    Examination:  Constitutional: NAD, alert, oriented to person and place, more coherent HEENT: conjunctivae and lids normal, EOMI CV: RRR no M,R,G. Distal pulses +2.  No cyanosis.   RESP: CTA B/L, normal respiratory effort  GI: +BS, NTND Extremities: No effusions, edema, or tenderness in lower legs.  Toe amputations. SKIN: warm, dry  Neuro: II - XII grossly intact.  Sensation intact    Data Reviewed: I have personally reviewed following labs and imaging studies  CBC: Recent Labs  Lab 01/15/20 0718 01/15/20 0718 01/16/20 0502 01/16/20 0502 01/17/20 0513 01/18/20 0222 01/19/20 0457 01/20/20 0559 01/21/20 0412  WBC 11.2*   < > 10.5   < > 9.7 13.1* 12.5* 12.9* 10.3  NEUTROABS 9.9*  --  9.3*  --  7.3 9.4*  --   --   --   HGB 11.3*   < > 13.3   < > 11.6* 11.0* 10.7* 9.9* 9.2*  HCT 34.4*   < > 42.5   < > 36.2* 34.4* 34.5* 31.6* 27.9*  MCV 88.0   < > 92.0   < > 89.6 88.9 92.5 91.1 88.3  PLT 140*   < > PLATELET CLUMPING, SUGGEST  RECOLLECTION OF SAMPLE IN CITRATE TUBE.   < > 144* 172 118* 116* PLATELET CLUMPING, SUGGEST RECOLLECTION OF SAMPLE IN CITRATE TUBE.   < > = values in this interval not displayed.   Basic Metabolic Panel: Recent Labs  Lab 01/17/20 0513 01/17/20 0513 01/17/20 2246 01/18/20 0222 01/19/20 0457 01/20/20 0559 01/21/20 0412  NA 150*   < > 150* 151* 144 142 141  K 3.7   < > 3.4* 3.4* 3.3* 4.1 3.7  CL 119*   < > 118* 118* 109 115* 112*  CO2 21*   < > 21* 18* 22 19* 21*  GLUCOSE 213*   < > 220* 183* 185* 127* 112*  BUN 73*   < > 62* 61* 54* 43* 32*  CREATININE 1.75*   < > 1.73* 1.71* 1.98* 1.70* 1.41*  CALCIUM 9.1   < > 9.2 9.4 8.5* 8.1* 8.0*  MG 2.9*  --   --  2.9* 2.5* 2.3 2.2   < > = values in this interval not displayed.   GFR: Estimated Creatinine Clearance: 50.8 mL/min (A) (by C-G formula based on SCr  of 1.41 mg/dL (H)). Liver Function Tests: No results for input(s): AST, ALT, ALKPHOS, BILITOT, PROT, ALBUMIN in the last 168 hours. No results for input(s): LIPASE, AMYLASE in the last 168 hours. No results for input(s): AMMONIA in the last 168 hours. Coagulation Profile: No results for input(s): INR, PROTIME in the last 168 hours. Cardiac Enzymes: No results for input(s): CKTOTAL, CKMB, CKMBINDEX, TROPONINI in the last 168 hours. BNP (last 3 results) No results for input(s): PROBNP in the last 8760 hours. HbA1C: No results for input(s): HGBA1C in the last 72 hours. CBG: Recent Labs  Lab 01/21/20 0035 01/21/20 0405 01/21/20 0759 01/21/20 1211 01/21/20 1714  GLUCAP 102* 113* 95 165* 155*   Lipid Profile: No results for input(s): CHOL, HDL, LDLCALC, TRIG, CHOLHDL, LDLDIRECT in the last 72 hours. Thyroid Function Tests: No results for input(s): TSH, T4TOTAL, FREET4, T3FREE, THYROIDAB in the last 72 hours. Anemia Panel: No results for input(s): VITAMINB12, FOLATE, FERRITIN, TIBC, IRON, RETICCTPCT in the last 72 hours. Sepsis Labs: Recent Labs  Lab 01/18/20 0222    PROCALCITON 0.80    Recent Results (from the past 240 hour(s))  MRSA PCR Screening     Status: None   Collection Time: 01/16/20  9:57 AM   Specimen: Nasal Mucosa; Nasopharyngeal  Result Value Ref Range Status   MRSA by PCR NEGATIVE NEGATIVE Final    Comment:        The GeneXpert MRSA Assay (FDA approved for NASAL specimens only), is one component of a comprehensive MRSA colonization surveillance program. It is not intended to diagnose MRSA infection nor to guide or monitor treatment for MRSA infections. Performed at [facility], [address] [place], [place] [address]   CULTURE, BLOOD (ROUTINE X 2) w Reflex to ID Panel     Status: None (Preliminary result)   Collection Time: 01/17/20  9:02 AM   Specimen: BLOOD  Result Value Ref Range Status   Specimen Description BLOOD RIGHT [facility]  Final   Special Requests   Final    BOTTLES DRAWN AEROBIC AND ANAEROBIC Blood Culture adequate volume   Culture   Final    NO GROWTH 4 DAYS Performed at [facility], [address] [place], [place] [address]    Report Status PENDING  Incomplete  CULTURE, BLOOD (ROUTINE X 2) w Reflex to ID Panel     Status: None (Preliminary result)   Collection Time: 01/17/20  9:10 AM   Specimen: BLOOD  Result Value Ref Range Status   Specimen Description BLOOD LEFT AC  Final   Special Requests   Final    BOTTLES DRAWN AEROBIC AND ANAEROBIC Blood Culture adequate volume   Culture   Final    NO GROWTH 4 DAYS Performed at [facility], [address] [place], [place] [address]    Report Status PENDING  Incomplete         Radiology Studies: No results found.      Scheduled Meds: . amiodarone  400 mg Oral BID  . apixaban  5 mg Oral BID  . aspirin EC  81 mg Oral Daily  . atorvastatin  40 mg Oral QHS  . cholecalciferol  1,000 Units Oral Daily  . clopidogrel  75 mg Oral Daily  . diphenhydrAMINE  50 mg Oral QHS  . famotidine  20 mg Oral Daily  . fluticasone  2  spray Each Nare Daily  . gabapentin  200 mg Oral BID  . haloperidol  0.5 mg Oral BID  .  insulin aspart  0-15 Units Subcutaneous Q4H  . insulin glargine  12 Units Subcutaneous Daily  . OLANZapine zydis  10 mg Oral QHS  . polyethylene glycol  34 g Oral Q2H  . senna-docusate  2 tablet Oral BID  . tamsulosin  0.4 mg Oral Daily   Continuous Infusions: . sodium chloride Stopped (01/15/20 0913)     LOS: 9 days     Enzo Bi, MD [facility]   To contact the attending provider between 7A-7P or the covering provider during after hours 7P-7A, please log into the web site [email or web address] and access using universal [place] password for that web site. If you do not have the password, please call the hospital operator.  01/21/2020, 6:57 PM

## 2020-01-21 NOTE — Progress Notes (Signed)
[facility] Admission History & Physical  MRN : [id number]  Jason Montes is a 69 y.o. (August 23, 1950) male who presents with chief complaint of  Chief Complaint  Patient presents with  . Altered Mental Status  .  History of Present Illness: 69 year old gentleman who presented with a foot ulcer and peripheral arterial disease was taken to the Cath Lab and underwent angiogram and tibial angioplasty.  Patient is status post toe amputation.  He is eating and doing well.  No new issues.  Current Facility-Administered Medications  Medication Dose Route Frequency Provider Last Rate Last Admin  . 0.9 %  sodium chloride infusion   Intravenous PRN Algernon Huxley, MD   Paused at 01/15/20 0913  . acetaminophen (TYLENOL) tablet 650 mg  650 mg Oral Q6H PRN Algernon Huxley, MD   650 mg at 01/18/20 1211   Or  . acetaminophen (TYLENOL) suppository 650 mg  650 mg Rectal Q6H PRN Algernon Huxley, MD      . amiodarone (PACERONE) tablet 400 mg  400 mg Oral BID Callwood, Dwayne D, MD   400 mg at 01/21/20 1028  . apixaban (ELIQUIS) tablet 5 mg  5 mg Oral BID Dallie Piles, RPH   5 mg at 01/21/20 1028  . aspirin EC tablet 81 mg  81 mg Oral Daily Algernon Huxley, MD   81 mg at 01/21/20 1028  . atorvastatin (LIPITOR) tablet 40 mg  40 mg Oral QHS Algernon Huxley, MD   40 mg at 01/20/20 2117  . Chlorhexidine Gluconate Cloth 2 % PADS 6 each  6 each Topical Daily Algernon Huxley, MD   6 each at 01/20/20 1729  . cholecalciferol (VITAMIN D3) tablet 1,000 Units  1,000 Units Oral Daily Algernon Huxley, MD   1,000 Units at 01/21/20 1029  . clopidogrel (PLAVIX) tablet 75 mg  75 mg Oral Daily Algernon Huxley, MD   75 mg at 01/21/20 1028  . diphenhydrAMINE (BENADRYL) capsule 50 mg  50 mg Oral QHS Algernon Huxley, MD   50 mg at 01/20/20 2116  . famotidine (PEPCID) tablet 20 mg  20 mg Oral Daily Algernon Huxley, MD   20 mg at 01/21/20 1028  . fluticasone (FLONASE) 50 MCG/ACT nasal spray 2 spray  2 spray Each Nare Daily Algernon Huxley, MD   2 spray at 01/21/20 1026  . gabapentin (NEURONTIN) capsule 200 mg  200 mg Oral BID Algernon Huxley, MD   200 mg at 01/21/20 1028  . haloperidol (HALDOL) tablet 0.5 mg  0.5 mg Oral BID Algernon Huxley, MD   0.5 mg at 01/21/20 1029  . hydrALAZINE (APRESOLINE) injection 5 mg  5 mg Intravenous Q2H PRN Algernon Huxley, MD      . insulin aspart (novoLOG) injection 0-15 Units  0-15 Units Subcutaneous Q4H Sharion Settler, NP   3 Units at 01/20/20 1955  . insulin glargine (LANTUS) injection 12 Units  12 Units Subcutaneous Daily Algernon Huxley, MD   12 Units at 01/21/20 1035  . nitroGLYCERIN (NITROSTAT) SL tablet 0.4 mg  0.4 mg Sublingual Q5 min PRN Algernon Huxley, MD      . OLANZapine zydis (ZYPREXA) disintegrating tablet 10 mg  10 mg Oral QHS Algernon Huxley, MD   10 mg at 01/20/20 2117  . ondansetron (ZOFRAN) injection 4 mg  4 mg Intravenous Q8H PRN Algernon Huxley, MD      . senna-docusate (Senokot-S)  tablet 2 tablet  2 tablet Oral BID Algernon Huxley, MD   2 tablet at 01/21/20 1028  . tamsulosin (FLOMAX) capsule 0.4 mg  0.4 mg Oral Daily Algernon Huxley, MD   0.4 mg at 01/21/20 1028    Past Medical History:  Diagnosis Date  . Allergy   . Atrial fibrillation ([place])   . B12 deficiency   . Chronic kidney disease   . Chronic kidney disease (CKD), stage II (mild)   . Coronary atherosclerosis   . Diabetes mellitus without complication ([place])   . Elevated PSA   . Esophagitis   . GERD (gastroesophageal reflux disease)   . Heart attack ([place])   . Heart disease   . Hyperlipidemia   . Hypertension   . Hypokalemia   . Mild neurocognitive disorder   . Osteoarthritis   . Presbyopia   . PVD (peripheral vascular disease) ([place])   . Restless leg syndrome   . Stroke (cerebrum) ([place])   . Stroke ([place])   . Subacute osteomyelitis ([place])   . Tinea unguium   . Uncompensated short term memory deficit     Past Surgical History:  Procedure Laterality Date  . AMPUTATION TOE Left 01/13/2020   Procedure: AMPUTATION RAY  LEFT 1ST;  Surgeon: Caroline More, DPM;  Location: [facility] ORS;  Service: Podiatry;  Laterality: Left;  . CARDIAC PACEMAKER PLACEMENT    . COLONOSCOPY WITH PROPOFOL N/A 05/18/2018   Procedure: COLONOSCOPY WITH PROPOFOL;  Surgeon: [name], MD;  Location: [facility] ENDOSCOPY;  Service: Endoscopy;  Laterality: N/A;  . CORONARY ARTERY BYPASS GRAFT    . FOOT AMPUTATION Right   . INTRAMEDULLARY (IM) NAIL INTERTROCHANTERIC Right 11/16/2019   Procedure: INTRAMEDULLARY (IM) NAIL INTERTROCHANTRIC;  Surgeon: Thornton Park, MD;  Location: [facility] ORS;  Service: Orthopedics;  Laterality: Right;  . LOWER EXTREMITY ANGIOGRAPHY Left 10/10/2019   Procedure: Lower Extremity Angiography;  Surgeon: Algernon Huxley, MD;  Location: [place] [facility];  Service: Cardiovascular;  Laterality: Left;  . LOWER EXTREMITY ANGIOGRAPHY Left 01/16/2020   Procedure: Lower Extremity Angiography;  Surgeon: Algernon Huxley, MD;  Location: [place];  Service: Cardiovascular;  Laterality: Left;    Social History Social History   Tobacco Use  . Smoking status: Never Smoker  . Smokeless tobacco: Never Used  Vaping Use  . Vaping Use: Never used  Substance Use Topics  . Alcohol use: Never  . Drug use: Never    Family History Family History  Problem Relation Age of Onset  . Heart failure Mother   . Colon cancer Mother   . Diabetes Mother   . Thyroid disease Sister     Allergies  Allergen Reactions  . Amlodipine Other (See Comments)  . Norvasc [Amlodipine Besylate]     Unknown  . Penicillins     Childhood allergy, not sure what happens     Physical Examination  Vitals:   01/21/20 0038 01/21/20 0404 01/21/20 0429 01/21/20 0822  BP: (!) 106/52 106/64  (!) 108/58  Pulse: 85 87  82  Resp: 20 20  16   Temp: (!) 97.5 F (36.4 C) (!) 97.3 F (36.3 C)  98 F (36.7 C)  TempSrc: Oral Oral    SpO2: 95% 95%  98%  Weight:   76.7 kg    Body mass index is 24.97 kg/m.  Her foot is bandaged at this point time.   This was not taken down.  His right groin is healing nicely.  He has good left  femoral and popliteal pulse.  CBC Lab Results  Component Value Date   WBC 10.3 01/21/2020   HGB 9.2 (L) 01/21/2020   HCT 27.9 (L) 01/21/2020   MCV 88.3 01/21/2020   PLT  01/21/2020    PLATELET CLUMPING, SUGGEST RECOLLECTION OF SAMPLE IN CITRATE TUBE.    BMET    Component Value Date/Time   NA 141 01/21/2020 0412   K 3.7 01/21/2020 0412   CL 112 (H) 01/21/2020 0412   CO2 21 (L) 01/21/2020 0412   GLUCOSE 112 (H) 01/21/2020 0412   BUN 32 (H) 01/21/2020 0412   CREATININE 1.41 (H) 01/21/2020 0412   CALCIUM 8.0 (L) 01/21/2020 0412   GFRNONAA 51 (L) 01/21/2020 0412   GFRAA 59 (L) 01/21/2020 0412   Estimated Creatinine Clearance: 50.8 mL/min (A) (by C-G formula based on SCr of 1.41 mg/dL (H)).  COAG Lab Results  Component Value Date   INR 1.3 (H) 01/11/2020   INR 1.5 (H) 11/16/2019   INR 1.6 (H) 11/15/2019   Assessment/Plan Status post angioplasty of the left tibial arteries.  Appears to be healing nicely.  He can follow-up as scheduled.   Shaune Spittle, MD Vascular surgery  01/21/2020 12:22 PM

## 2020-01-22 LAB — BASIC METABOLIC PANEL
Anion gap: 10 (ref 5–15)
BUN: 24 mg/dL — ABNORMAL HIGH (ref 8–23)
CO2: 19 mmol/L — ABNORMAL LOW (ref 22–32)
Calcium: 8.5 mg/dL — ABNORMAL LOW (ref 8.9–10.3)
Chloride: 107 mmol/L (ref 98–111)
Creatinine, Ser: 1.38 mg/dL — ABNORMAL HIGH (ref 0.61–1.24)
GFR calc Af Amer: >60 mL/min (ref >60)
GFR calc non Af Amer: 53 mL/min — ABNORMAL LOW (ref >60)
Glucose, Bld: 107 mg/dL — ABNORMAL HIGH (ref 70–99)
Potassium: 3.3 mmol/L — ABNORMAL LOW (ref 3.5–5.1)
Sodium: 136 mmol/L (ref 135–145)

## 2020-01-22 LAB — CULTURE, BLOOD (ROUTINE X 2)
Culture: NO GROWTH
Culture: NO GROWTH
Special Requests: ADEQUATE
Special Requests: ADEQUATE

## 2020-01-22 LAB — CBC
HCT: 24.6 % — ABNORMAL LOW (ref 39.0–52.0)
Hemoglobin: 8.5 g/dL — ABNORMAL LOW (ref 13.0–17.0)
MCH: 29.5 pg (ref 26.0–34.0)
MCHC: 34.6 g/dL (ref 30.0–36.0)
MCV: 85.4 fL (ref 80.0–100.0)
Platelets: 139 10*3/uL — ABNORMAL LOW (ref 150–400)
RBC: 2.88 MIL/uL — ABNORMAL LOW (ref 4.22–5.81)
RDW: 15.4 % (ref 11.5–15.5)
WBC: 12.3 10*3/uL — ABNORMAL HIGH (ref 4.0–10.5)
nRBC: 0 % (ref 0.0–0.2)

## 2020-01-22 LAB — MAGNESIUM: Magnesium: 2.2 mg/dL (ref 1.7–2.4)

## 2020-01-22 LAB — GLUCOSE, CAPILLARY
Glucose-Capillary: 105 mg/dL — ABNORMAL HIGH (ref 70–99)
Glucose-Capillary: 127 mg/dL — ABNORMAL HIGH (ref 70–99)
Glucose-Capillary: 131 mg/dL — ABNORMAL HIGH (ref 70–99)
Glucose-Capillary: 86 mg/dL (ref 70–99)
Glucose-Capillary: 87 mg/dL (ref 70–99)

## 2020-01-22 MED ORDER — MAGNESIUM CITRATE PO SOLN
1.0000 | Freq: Once | ORAL | Status: AC
  Administered 2020-01-22: 1 via ORAL
  Filled 2020-01-22: qty 296

## 2020-01-22 MED ORDER — POTASSIUM CHLORIDE CRYS ER 20 MEQ PO TBCR
40.0000 meq | EXTENDED_RELEASE_TABLET | Freq: Once | ORAL | Status: AC
  Administered 2020-01-22: 40 meq via ORAL
  Filled 2020-01-22: qty 2

## 2020-01-22 MED ORDER — CHLORHEXIDINE GLUCONATE CLOTH 2 % EX PADS
6.0000 | MEDICATED_PAD | Freq: Every day | CUTANEOUS | Status: DC
  Administered 2020-01-23 – 2020-02-03 (×13): 6 via TOPICAL

## 2020-01-22 MED ORDER — INSULIN ASPART 100 UNIT/ML ~~LOC~~ SOLN
0.0000 [IU] | Freq: Three times a day (TID) | SUBCUTANEOUS | Status: DC
  Administered 2020-01-23 – 2020-01-24 (×3): 2 [IU] via SUBCUTANEOUS
  Administered 2020-01-25: 5 [IU] via SUBCUTANEOUS
  Administered 2020-01-25: 8 [IU] via SUBCUTANEOUS
  Administered 2020-01-26: 5 [IU] via SUBCUTANEOUS
  Administered 2020-01-26: 2 [IU] via SUBCUTANEOUS
  Administered 2020-01-26: 5 [IU] via SUBCUTANEOUS
  Filled 2020-01-22 (×8): qty 1

## 2020-01-22 NOTE — TOC Progression Note (Addendum)
Transition of Care [facility]) - Progression Note    Patient Details  Name: Jason Montes MRN: [id number] Date of Birth: 1951-01-31  Transition of [facility]) CM/SW [place], LCSW Phone Number: 01/22/2020, 11:01 AM  Clinical Narrative:    CSW met with patient and nurse to attempt screen completion and discharge planning. Patient extremely confused, not coherent. CSW unable to communicate with patient due to confusion, however discharging patient back to previous boarding house is not a safe discharge in my opinion. This patient is not able to care for himself in current state, patient requires his ongoing mental health issues(questionable overdose/Pt hx) be addressed and become stabilized first if the intent is D/C home/SELF         Expected Discharge Plan and Services  [facility]. Mental health has not stabilized                                                Social Determinants of Health (SDOH) Interventions    Readmission Risk Interventions Readmission Risk Prevention Plan 01/17/2020  Medication Review ([place]) Complete  HRI or Home Care Consult Complete  Palliative Care Screening Not Applicable  Some recent data might be hidden

## 2020-01-22 NOTE — Progress Notes (Signed)
Patient unable to void post cath removal..Marland KitchenBladder scan 400 ml. Continues to try to use urinal to void. No BM yet after metamucil every two hours.

## 2020-01-22 NOTE — Progress Notes (Signed)
PROGRESS NOTE    Jason Montes  YIR:[id number] DOB: [birth year] DOA: 01/11/2020 PCP: [facility]   Chief complaint.  Altered mental status.  Brief Narrative:  Jason Klaiber [name] a 69 y.o.Caucasian malewith medical history significant ofhypertension, hyperlipidemia, diabetes mellitus, stroke, GERD, depression, RLS, PVD, CAD, CKD-3, atrial fibrillation on Eliquis, chronic left greater toe osteomyelitis,sCHF with EF of 40-45%, who presents with altered mental status and overdose ?unintentional.   Assessment & Plan:   Principal Problem:   Acute metabolic encephalopathy Active Problems:   Atrial fibrillation (HCC)   CAD (coronary artery disease)   GERD with esophagitis   Chronic systolic CHF (congestive heart failure) (HCC)   Hyperlipidemia   Essential hypertension   Acute renal failure superimposed on stage 3a chronic kidney disease ([place])   Stage 3a chronic kidney disease   Osteomyelitis of great toe of left foot ([place])   Type II diabetes mellitus with renal manifestations ([place])   Stroke ([place])   Depression   Suicidal behavior   Pressure injury of skin  Hypotension --systolic 50'K morning of 9/38.  Improved with IVF 500 ml. --BP normalized now PLAN: --Hold MIVF since pt is now alert and taking in food/drink. --continue to hold home coreg, lisinopril and torsemide because BP normal   1.  Acute metabolic encephalopathy, improving Appear to be secondary to acute infection. Condition gradually improving.  He was sleepy, he received some Ativan early this morning.  Discontinue Ativan, continue as needed Haldol. There was some concern about suicide ideation, patient has been evaluated by psychiatry.  Patient was too sleepy to have thorough evaluation.   --Mental status now much improved --re-consult psych  #2.  Osteomyelitis of the left left great toe s/p amputation Developed a fever again on 8/18.  assumed to be secondary to aspiration pneumonia.  Blood  culture sent out. --completed IV abx course --amputation site clean and dry with stiches.   --continue dry dressing  3.  Bilateral lower lobe aspiration pneumonia. Probably secondary to altered mental status.  Do not believe patient has an dysphagia.   --completed IV abx course  4.  Urinary retention secondary to benign prostate hypertrophy. Patient had significant urinary tension with residual over 1370 mL.  Foley was inserted. --removed foley 8/21, however, pt failed voiding trial PLAN: --Foley re-inserted today --continue Flomax  5.  Acute kidney injury on chronic kidney disease stage IIIa. Renal function improving, Cr 1.38 today --encourage oral hydration  6.  Hypernatremia, resolved Patient received normal saline instead of lactated Ringer's solution during the surgery.    7.  Slight thrombocytopenia. Likely secondary to infection.   --monitor  # Paroxysmal atrial fibrillation. # Ventricular tachycardia non sustained Currently rate controlled --continue amiodarone  9.  Chronic systolic congestive heart failure with moderate pulmonary hypertension. No evidence of exacerbation.    10.  Uncontrolled type 2 diabetes with hyperglycemia. --Hold home metformin --continue Lantus 12u daily --SSI TID  11.  Nonsustained ventricular tachycardia. --treat underlying cause, per cards --no need for tele, per cards --continue amiodarone --continue eliquis  Constipation --s/p High-dose Miralax  --mag citrate today   DVT prophylaxis: HW:[id number] Code Status: DNR  Family Communication:  Status is: inpatient Dispo:   The patient is from: boarding house Anticipated d/c is to: undetermined Anticipated d/c date is: undetermined Patient currently is not medically stable to d/c due to: significant debility, no safe discharge disposition   Consultants:  Podiatry. Psychiatry   Procedures:  Left hallux amputation    Subjective: Started having  BM's.  Failed  voiding trial, Foley re-inserted.   Objective: Vitals:   01/22/20 0710 01/22/20 0950 01/22/20 1146 01/22/20 1636  BP: 122/64 112/66 (!) 109/47 112/66  Pulse: 93 82 79 86  Resp: 20     Temp: (!) 97.3 F (36.3 C)  97.7 F (36.5 C)   TempSrc: Oral  Oral   SpO2: 94%  97% 95%  Weight:        Intake/Output Summary (Last 24 hours) at 01/22/2020 1706 Last data filed at 01/22/2020 1525 Gross per 24 hour  Intake 1440 ml  Output 703 ml  Net 737 ml   Filed Weights   01/20/20 0500 01/21/20 0429 01/22/20 0041  Weight: 78.5 kg 76.7 kg 77.7 kg    Examination:  Constitutional: NAD, alert, oriented to person and hospital, more coherent, cooperative HEENT: conjunctivae and lids normal, EOMI CV: RRR no M,R,G. Distal pulses +2.  No cyanosis.   RESP: CTA B/L, normal respiratory effort  GI: +BS, NTND Extremities: No effusions, edema, or tenderness in lower legs.  Left great toe amputation site erythematous but without warmth or drainage, stiches present MSK: amputation of all right toes SKIN: warm, dry and intact Neuro: II - XII grossly intact.  Sensation intact Psych: Normal mood and affect.      Data Reviewed: I have personally reviewed following labs and imaging studies  CBC: Recent Labs  Lab 01/16/20 0502 01/16/20 0502 01/17/20 4163 01/17/20 0513 01/18/20 0222 01/19/20 0457 01/20/20 0559 01/21/20 0412 01/22/20 0628  WBC 10.5   < > 9.7   < > 13.1* 12.5* 12.9* 10.3 12.3*  NEUTROABS 9.3*  --  7.3  --  9.4*  --   --   --   --   HGB 13.3   < > 11.6*   < > 11.0* 10.7* 9.9* 9.2* 8.5*  HCT 42.5   < > 36.2*   < > 34.4* 34.5* 31.6* 27.9* 24.6*  MCV 92.0   < > 89.6   < > 88.9 92.5 91.1 88.3 85.4  PLT PLATELET CLUMPING, SUGGEST RECOLLECTION OF SAMPLE IN [place] TUBE.   < > 144*   < > 172 118* 116* PLATELET CLUMPING, SUGGEST RECOLLECTION OF SAMPLE IN CITRATE TUBE. 139*   < > = values in this interval not displayed.   Basic Metabolic Panel: Recent Labs  Lab 01/18/20 0222  01/19/20 0457 01/20/20 0559 01/21/20 0412 01/22/20 0628  NA 151* 144 142 141 136  K 3.4* 3.3* 4.1 3.7 3.3*  CL 118* 109 115* 112* 107  CO2 18* 22 19* 21* 19*  GLUCOSE 183* 185* 127* 112* 107*  BUN 61* 54* 43* 32* 24*  CREATININE 1.71* 1.98* 1.70* 1.41* 1.38*  CALCIUM 9.4 8.5* 8.1* 8.0* 8.5*  MG 2.9* 2.5* 2.3 2.2 2.2   GFR: Estimated Creatinine Clearance: 51.9 mL/min (A) (by C-G formula based on SCr of 1.38 mg/dL (H)). Liver Function Tests: No results for input(s): AST, ALT, ALKPHOS, BILITOT, PROT, ALBUMIN in the last 168 hours. No results for input(s): LIPASE, AMYLASE in the last 168 hours. No results for input(s): AMMONIA in the last 168 hours. Coagulation Profile: No results for input(s): INR, PROTIME in the last 168 hours. Cardiac Enzymes: No results for input(s): CKTOTAL, CKMB, CKMBINDEX, TROPONINI in the last 168 hours. BNP (last 3 results) No results for input(s): PROBNP in the last 8760 hours. HbA1C: No results for input(s): HGBA1C in the last 72 hours. CBG: Recent Labs  Lab 01/21/20 2000 01/22/20 0013 01/22/20 0546 01/22/20 1154 01/22/20  [place]   Lipid Profile: No results for input(s): CHOL, HDL, LDLCALC, TRIG, CHOLHDL, LDLDIRECT in the last 72 hours. Thyroid Function Tests: No results for input(s): TSH, T4TOTAL, FREET4, T3FREE, THYROIDAB in the last 72 hours. Anemia Panel: No results for input(s): VITAMINB12, FOLATE, FERRITIN, TIBC, IRON, RETICCTPCT in the last 72 hours. Sepsis Labs: Recent Labs  Lab 01/18/20 0222  PROCALCITON 0.80    Recent Results (from the past 240 hour(s))  MRSA PCR Screening     Status: None   Collection Time: 01/16/20  9:57 AM   Specimen: Nasal Mucosa; Nasopharyngeal  Result Value Ref Range Status   MRSA by PCR NEGATIVE NEGATIVE Final    Comment:        The GeneXpert MRSA Assay (FDA approved for NASAL specimens only), is one component of a comprehensive MRSA colonization surveillance program.  It is not intended to diagnose MRSA infection nor to guide or monitor treatment for MRSA infections. Performed at [facility], [address] [place], [place] [address]   CULTURE, BLOOD (ROUTINE X 2) w Reflex to ID Panel     Status: None   Collection Time: 01/17/20  9:02 AM   Specimen: BLOOD  Result Value Ref Range Status   Specimen Description BLOOD RIGHT [facility]  Final   Special Requests   Final    BOTTLES DRAWN AEROBIC AND ANAEROBIC Blood Culture adequate volume   Culture   Final    NO GROWTH 5 DAYS Performed at [facility], [address] [place], [place] [address]    Report Status 01/22/2020 FINAL  Final  CULTURE, BLOOD (ROUTINE X 2) w Reflex to ID Panel     Status: None   Collection Time: 01/17/20  9:10 AM   Specimen: BLOOD  Result Value Ref Range Status   Specimen Description BLOOD LEFT [facility]  Final   Special Requests   Final    BOTTLES DRAWN AEROBIC AND ANAEROBIC Blood Culture adequate volume   Culture   Final    NO GROWTH 5 DAYS Performed at [facility], [address], [place], [place] [address]    Report Status 01/22/2020 FINAL  Final         Radiology Studies: No results found.      Scheduled Meds: . amiodarone  400 mg Oral BID  . apixaban  5 mg Oral BID  . aspirin EC  81 mg Oral Daily  . atorvastatin  40 mg Oral QHS  . cholecalciferol  1,000 Units Oral Daily  . clopidogrel  75 mg Oral Daily  . diphenhydrAMINE  50 mg Oral QHS  . famotidine  20 mg Oral Daily  . fluticasone  2 spray Each Nare Daily  . gabapentin  200 mg Oral BID  . haloperidol  0.5 mg Oral BID  . insulin aspart  0-15 Units Subcutaneous Q4H  . insulin glargine  12 Units Subcutaneous Daily  . OLANZapine zydis  10 mg Oral QHS  . senna-docusate  2 tablet Oral BID  . tamsulosin  0.4 mg Oral Daily   Continuous Infusions: . sodium chloride Stopped (01/15/20 0913)     LOS: 10 days     Enzo Bi, MD [facility]   To contact the attending  provider between 7A-7P or the covering provider during after hours 7P-7A, please log into the web site [email or web address] and access using universal [place] password for that web site. If you do not have the password, please call the  hospital operator.  01/22/2020, 5:06 PM

## 2020-01-22 NOTE — Progress Notes (Signed)
Multiple attempts to void in urinal, sitting up and lying down. Bladder scan 767ml, 700 ml out during I&O cath using sterile technique. Remains confused but cooperative. Sitter in room. Had small formed BM, brown.

## 2020-01-22 NOTE — Progress Notes (Signed)
Physical Therapy Treatment Patient Details Name: Jason Montes MRN: [id number] DOB: 16-Apr-1951 Today's Date: 01/22/2020    History of Present Illness Jason Montes is a 46yoM who comes to [facility] 8/11 c AMS, overdose. Patientseen by podiatry as he was scheduled for procedure for partial left first ray amputation on 8/13.  Patient did not have cardiology clearance and needed it prior to surgery. Pt underwent Lt hallux amputation on 8/13, revascularization procedureon 8/16. PMH: HTN, HLD, DM, CVA, GERD, depression, RLS, PVD, CAD, CKD-3, AF on Eliquis, chronic Lt hallux osteomyelitis, sCHF with EF of 40-45%    PT Comments    Pt in bed, ready for session but asking me to take him home.  He is unable to recall address but when I reminded him of his street "Oh that's it."  Pt asking often during session if I would just wheel him where we were going so he could get home.  Pt is well known to me from a previous recent admission for hip fx.  Pt intact prior but confusion today noted.  Talking about a wedding where my father was at and that he needs to stop having and attending parties.    He is able to get to EOB with mod a x 1 and cues.  Once sitting he is generally stable today  Stood x 5 at bedside to RW,  He has heavy post lean/push and requites +1 assist to hold walker to floor and +1 to support him.  Overall attempts did improve and he progressed from mod/max a x 1 min/mod a to stand with +2 assist.  He remains very unsteady in standing and unable to take steps.  Participated in exercises as described below.  He is unable to laterally scoot to left in sitting and requires mod a x 1 to assist.  Returns to supine with mod a x 1 to reposition in bed despite increased time and cues to complete.    Pt requires extensive assist and cues.  Pt living at boarding house with limited assist available to him.  Given cognition and extensive assist needed and limited discharge options discharge planning may be  challenging.  He would be appropriate for a higher level of care if available to him.     Follow Up Recommendations  Supervision/Assistance - 24 hour;Supervision for mobility/OOB;Other (comment)  See above.       Equipment Recommendations  None recommended by PT    Recommendations for Other Services       Precautions / Restrictions Precautions Precautions: Fall Precaution Comments: Suicide precautions Required Braces or Orthoses: Other Brace Other Brace: postop shoe Restrictions Weight Bearing Restrictions: Yes RLE Weight Bearing: Weight bearing as tolerated LLE Weight Bearing: Partial weight bearing LLE Partial Weight Bearing Percentage or Pounds: post op shoe,wb heel only    Mobility  Bed Mobility Overal bed mobility: Needs Assistance Bed Mobility: Supine to Sit;Sit to Supine     Supine to sit: Mod assist Sit to supine: Mod assist   General bed mobility comments: cues for hand placements and use of rails  Transfers Overall transfer level: Needs assistance Equipment used: Rolling walker (2 wheeled) Transfers: Sit to/from Stand Sit to Stand: From elevated surface;Mod assist;+2 physical assistance         General transfer comment: stood x 5 at bedside with assist +2.  Needs heavy assist to hold walker on floor to prevent post LOB.  He relies and pulls heavily on walker for transition and while standing.  Ambulation/Gait  Assistive device: Rolling walker (2 wheeled)       General Gait Details: unsafe to progress away from bed 2/2 to severe posterior push and tremors upon standing. no dizziness or lightheadedness throughout session .    Stairs             Wheelchair Mobility    Modified Rankin (Stroke Patients Only)       Balance Overall balance assessment: Needs assistance Sitting-balance support: Bilateral upper extremity supported;Feet supported Sitting balance-Leahy Scale: Fair Sitting balance - Comments: does well sitting today    Standing balance support: Bilateral upper extremity supported;During functional activity Standing balance-Leahy Scale: Poor Standing balance comment: severe posterior push requiring constant assistance to prevent fall posteriorly.                            Cognition Arousal/Alertness: Awake/alert Behavior During Therapy: WFL for tasks assessed/performed Overall Cognitive Status: Impaired/Different from baseline                                 General Comments: increased confusion from last admission where he was intact.      Exercises Other Exercises Other Exercises: seated AROM 2 x 10 for LAQ, marches, ab/add and ankle pumps as able.  no LOB today in sitting    General Comments        Pertinent Vitals/Pain Pain Assessment: No/denies pain    Home Living                      Prior Function            PT Goals (current goals can now be found in the care plan section) Progress towards PT goals: Progressing toward goals    Frequency    7X/week      PT Plan Current plan remains appropriate;Other (comment)    Co-evaluation              AM-PAC PT "6 Clicks" Mobility   Outcome Measure  Help needed turning from your back to your side while in a flat bed without using bedrails?: A Lot Help needed moving from lying on your back to sitting on the side of a flat bed without using bedrails?: A Lot Help needed moving to and from a bed to a chair (including a wheelchair)?: Total Help needed standing up from a chair using your arms (e.g., wheelchair or bedside chair)?: A Lot Help needed to walk in hospital room?: Total Help needed climbing 3-5 steps with a railing? : Total 6 Click Score: 9    End of Session Equipment Utilized During Treatment: Gait belt Activity Tolerance: Patient tolerated treatment well;Patient limited by fatigue Patient left: in bed;with call bell/phone within reach;with bed alarm set;with nursing/sitter in  room Nurse Communication: Mobility status       Time: 4536-4680 PT Time Calculation (min) (ACUTE ONLY): 24 min  Charges:  $Therapeutic Exercise: 8-22 mins $Therapeutic Activity: 8-22 mins                    Chesley Noon, PTA 01/22/20, 12:24 PM

## 2020-01-23 ENCOUNTER — Inpatient Hospital Stay: Payer: Medicare Other

## 2020-01-23 LAB — BASIC METABOLIC PANEL
Anion gap: 7 (ref 5–15)
BUN: 16 mg/dL (ref 8–23)
CO2: 19 mmol/L — ABNORMAL LOW (ref 22–32)
Calcium: 8.3 mg/dL — ABNORMAL LOW (ref 8.9–10.3)
Chloride: 109 mmol/L (ref 98–111)
Creatinine, Ser: 1.16 mg/dL (ref 0.61–1.24)
GFR calc Af Amer: >60 mL/min (ref >60)
GFR calc non Af Amer: >60 mL/min (ref >60)
Glucose, Bld: 69 mg/dL — ABNORMAL LOW (ref 70–99)
Potassium: 3.9 mmol/L (ref 3.5–5.1)
Sodium: 135 mmol/L (ref 135–145)

## 2020-01-23 LAB — CBC
HCT: 28.5 % — ABNORMAL LOW (ref 39.0–52.0)
Hemoglobin: 9.1 g/dL — ABNORMAL LOW (ref 13.0–17.0)
MCH: 28.4 pg (ref 26.0–34.0)
MCHC: 31.9 g/dL (ref 30.0–36.0)
MCV: 89.1 fL (ref 80.0–100.0)
Platelets: 182 10*3/uL (ref 150–400)
RBC: 3.2 MIL/uL — ABNORMAL LOW (ref 4.22–5.81)
RDW: 15.6 % — ABNORMAL HIGH (ref 11.5–15.5)
WBC: 13.4 10*3/uL — ABNORMAL HIGH (ref 4.0–10.5)
nRBC: 0 % (ref 0.0–0.2)

## 2020-01-23 LAB — GLUCOSE, CAPILLARY
Glucose-Capillary: 108 mg/dL — ABNORMAL HIGH (ref 70–99)
Glucose-Capillary: 79 mg/dL (ref 70–99)
Glucose-Capillary: 128 mg/dL — ABNORMAL HIGH (ref 70–99)
Glucose-Capillary: 108 mg/dL — ABNORMAL HIGH (ref 70–99)
Glucose-Capillary: 113 mg/dL — ABNORMAL HIGH (ref 70–99)

## 2020-01-23 LAB — MAGNESIUM: Magnesium: 2.5 mg/dL — ABNORMAL HIGH (ref 1.7–2.4)

## 2020-01-23 LAB — VITAMIN B12: Vitamin B-12: 873 pg/mL (ref 180–914)

## 2020-01-23 MED ORDER — OLANZAPINE 5 MG PO TBDP
12.5000 mg | ORAL_TABLET | Freq: Every day | ORAL | Status: DC
  Administered 2020-01-23 – 2020-02-02 (×11): 12.5 mg via ORAL
  Filled 2020-01-23 (×12): qty 0.5

## 2020-01-23 MED ORDER — GABAPENTIN 100 MG PO CAPS
200.0000 mg | ORAL_CAPSULE | Freq: Three times a day (TID) | ORAL | Status: DC
  Administered 2020-01-23 – 2020-02-03 (×32): 200 mg via ORAL
  Filled 2020-01-23 (×35): qty 2

## 2020-01-23 MED ORDER — THIAMINE HCL 100 MG/ML IJ SOLN: 100.0000 mg | Freq: Every day | INTRAMUSCULAR | Status: DC

## 2020-01-23 MED ORDER — DIPHENHYDRAMINE HCL 12.5 MG/5ML PO ELIX
12.5000 mg | ORAL_SOLUTION | Freq: Every day | ORAL | Status: DC
  Administered 2020-01-23: 12.5 mg via ORAL
  Filled 2020-01-23: qty 5

## 2020-01-23 MED ORDER — THIAMINE HCL 100 MG/ML IJ SOLN
500.0000 mg | Freq: Three times a day (TID) | INTRAVENOUS | Status: DC
  Administered 2020-01-23 – 2020-01-25 (×5): 500 mg via INTRAVENOUS
  Filled 2020-01-23: qty 5
  Filled 2020-01-23: qty 2
  Filled 2020-01-23 (×7): qty 5

## 2020-01-23 MED ORDER — CLONAZEPAM 0.25 MG PO TBDP
0.2500 mg | ORAL_TABLET | Freq: Two times a day (BID) | ORAL | Status: DC
  Administered 2020-01-23: 0.25 mg via ORAL
  Filled 2020-01-23 (×2): qty 1

## 2020-01-23 MED ORDER — INSULIN GLARGINE 100 UNIT/ML ~~LOC~~ SOLN
8.0000 [IU] | Freq: Every day | SUBCUTANEOUS | Status: DC
  Administered 2020-01-23 – 2020-01-31 (×9): 8 [IU] via SUBCUTANEOUS
  Filled 2020-01-23 (×8): qty 0.08

## 2020-01-23 MED ORDER — CLONAZEPAM 0.5 MG PO TABS
0.5000 mg | ORAL_TABLET | Freq: Two times a day (BID) | ORAL | Status: DC
  Administered 2020-01-23: 0.5 mg via ORAL
  Filled 2020-01-23: qty 1

## 2020-01-23 MED ORDER — ENSURE ENLIVE PO LIQD
237.0000 mL | Freq: Three times a day (TID) | ORAL | Status: DC
  Administered 2020-01-24 – 2020-02-02 (×22): 237 mL via ORAL

## 2020-01-23 MED ORDER — CYANOCOBALAMIN 1000 MCG/ML IJ SOLN
1000.0000 ug | Freq: Every day | INTRAMUSCULAR | Status: DC
  Administered 2020-01-23: 1000 ug via INTRAMUSCULAR
  Filled 2020-01-23 (×2): qty 1

## 2020-01-23 MED ORDER — VITAMIN B-12 1000 MCG PO TABS: 1000.0000 ug | ORAL_TABLET | Freq: Every day | ORAL | Status: DC

## 2020-01-23 MED ORDER — HALOPERIDOL 1 MG PO TABS
1.0000 mg | ORAL_TABLET | Freq: Two times a day (BID) | ORAL | Status: DC
  Administered 2020-01-23 – 2020-02-03 (×21): 1 mg via ORAL
  Filled 2020-01-23 (×23): qty 1

## 2020-01-23 MED ORDER — ASCORBIC ACID 500 MG PO TABS
500.0000 mg | ORAL_TABLET | Freq: Two times a day (BID) | ORAL | Status: DC
  Administered 2020-01-23 – 2020-02-03 (×22): 500 mg via ORAL
  Filled 2020-01-23 (×23): qty 1

## 2020-01-23 MED ORDER — POLYETHYLENE GLYCOL 3350 17 G PO PACK
17.0000 g | PACK | Freq: Two times a day (BID) | ORAL | Status: DC
  Administered 2020-01-23 – 2020-02-03 (×22): 17 g via ORAL
  Filled 2020-01-23 (×23): qty 1

## 2020-01-23 MED ORDER — LORAZEPAM 2 MG/ML IJ SOLN: 2.0000 mg | Freq: Once | INTRAMUSCULAR | Status: DC | PRN

## 2020-01-23 MED ORDER — ADULT MULTIVITAMIN W/MINERALS CH
1.0000 | ORAL_TABLET | Freq: Every day | ORAL | Status: DC
  Administered 2020-01-24 – 2020-02-03 (×11): 1 via ORAL
  Filled 2020-01-23 (×12): qty 1

## 2020-01-23 MED ORDER — DULOXETINE HCL 20 MG PO CPEP
20.0000 mg | ORAL_CAPSULE | Freq: Every day | ORAL | Status: DC
  Administered 2020-01-23 – 2020-02-03 (×12): 20 mg via ORAL
  Filled 2020-01-23 (×12): qty 1

## 2020-01-23 NOTE — Care Management Important Message (Signed)
Important Message  Patient Details  Name: Jason Montes MRN: [id number] Date of Birth: November 06, 1950   Medicare Important Message Given:  Yes     Dannette Barbara 01/23/2020, 12:49 PM

## 2020-01-23 NOTE — Progress Notes (Signed)
PROGRESS NOTE    Jason Montes  WNU:[id number] DOB: May 13, 1951 DOA: 01/11/2020 PCP: [facility]   Chief complaint.  Altered mental status.  Brief Narrative:  Jason Agner [name] a 69 y.o.Caucasian malewith medical history significant ofhypertension, hyperlipidemia, diabetes mellitus, stroke, GERD, depression, RLS, PVD, CAD, CKD-3, atrial fibrillation on Eliquis, chronic left greater toe osteomyelitis,sCHF with EF of 40-45%, who presents with altered mental status and overdose ?unintentional.   Assessment & Plan:   Principal Problem:   Acute metabolic encephalopathy Active Problems:   Atrial fibrillation (HCC)   CAD (coronary artery disease)   GERD with esophagitis   Chronic systolic CHF (congestive heart failure) (HCC)   Hyperlipidemia   Essential hypertension   Acute renal failure superimposed on stage 3a chronic kidney disease ([place])   Protein-calorie malnutrition, severe (HCC)   Stage 3a chronic kidney disease   Osteomyelitis of great toe of left foot (HCC)   Type II diabetes mellitus with renal manifestations ([place])   Stroke (HCC)   Depression   Suicidal behavior   Pressure injury of skin   Slurred speech --new today --MRI brain can not be done due to pacemaker PLAN: --Neuro consult - Added on labs: B12, thiamine, MMA, RPR --empiric vit B12 suppl started by neuro today --empiric thiamine suppl started by neuro today --EEG --repeat head CT  Acute metabolic encephalopathy Appear to be secondary to acute infection. Condition gradually improving.  There was some concern about suicide ideation, patient had been evaluated by psychiatry.  Was started on scheduled Benadryl, Haldol and Zyprexa.   --mental status slowly improving, but still confused  --re-engage psych today, rec Haldol increased to 1 bid Zyprexa 12.5 qhs Klonopin regular dosing 0.5 bid And Cymbalta to help with depression and anxiety   Reported drug overdose --wound need psych  eval for SI prior to discharge.  Osteomyelitis of the left left great toe s/p amputation Developed a fever again on 8/18.  assumed to be secondary to aspiration pneumonia.  Blood culture sent out. --completed IV abx course --amputation site clean and dry with stiches.   --continue dry dressing  Bilateral lower lobe aspiration pneumonia. Probably secondary to altered mental status.  Do not believe patient has an dysphagia.   --completed IV abx course  Hypotension, resolved --systolic 03'K morning of 7/42.  Improved with IVF 500 ml. --BP normalized now PLAN: --Hold MIVF since pt is now alert and taking in food/drink. --continue to hold home coreg, lisinopril and torsemide because BP normal   Urinary retention presumed secondary to benign prostate hypertrophy. Patient had significant urinary tension with residual over 1370 mL.  Foley was inserted and pt has been receiving Flomax. --removed foley 8/21, however, pt failed voiding trial, Foley re-inserted. PLAN: --continue Foley --continue Flomax  Acute kidney injury on chronic kidney disease stage IIIa. --Cr improved to 1.16 today --Hold IVF and encourage oral hydration.  Hypernatremia, resolved Patient received normal saline instead of lactated Ringer's solution during the surgery.    Slight thrombocytopenia, resolved Likely secondary to infection.    # Paroxysmal atrial fibrillation. # Ventricular tachycardia non sustained --currently rate controlled. --treat underlying cause, per cards --no need for tele, per cards --continue amiodarone --continue Eliquis  Chronic systolic congestive heart failure with moderate pulmonary hypertension. Currently No evidence of exacerbation.    Uncontrolled type 2 diabetes with hyperglycemia. --hypoglycemia episode early this morning PLAN: --Hold home metformin --Decrease Lantus to 8u daily (down from 12u) --SSI TID with meals  Constipation --s/p High-dose Miralax  --continue  miralax BID scheduled  Severe malnutrition in context of chronic illness Ensure Enlive po TID, each supplement provides 350 kcal and 20 grams of protein MVI daily  Vitamin C 500mg  po BID Liberalize diet (finger foods)   DVT prophylaxis: JO:ACZYSAY Code Status: DNR  Family Communication:  Status is: inpatient Dispo:   The patient is from: boarding house Anticipated d/c is to: undetermined Anticipated d/c date is: undetermined Patient currently is not medically stable to d/c due to: significant debility, no safe discharge disposition   Consultants:  Podiatry. Psychiatry   Procedures:  Left hallux amputation    Subjective: Pt became more confused again today, trying to get out of bed.  Pt also had slurred speech which was new.    Neuro consult today.   Objective: Vitals:   01/23/20 0100 01/23/20 0500 01/23/20 0727 01/23/20 1444  BP:   (!) 115/55 113/67  Pulse:   85 81  Resp:   20 (!) 22  Temp:   97.6 F (36.4 C) 98.2 F (36.8 C)  TempSrc:   Oral Oral  SpO2:   90% 98%  Weight:  77.5 kg    Height: 5\' 9"  (1.753 m)       Intake/Output Summary (Last 24 hours) at 01/23/2020 1526 Last data filed at 01/23/2020 1243 Gross per 24 hour  Intake 360 ml  Output 2050 ml  Net -1690 ml   Filed Weights   01/21/20 0429 01/22/20 0041 01/23/20 0500  Weight: 76.7 kg 77.7 kg 77.5 kg    Examination:  Constitutional: NAD, alert, more confused, slurred speech. HEENT: conjunctivae and lids normal, EOMI CV: RRR no M,R,G. Distal pulses +2.  No cyanosis.   RESP: CTA B/L, normal respiratory effort  GI: +BS, NTND Extremities: No effusions, edema, or tenderness in lower legs.  Left great toe amputation site erythematous but without warmth or drainage, stiches present SKIN: warm, dry and intact Neuro: II - XII grossly intact, except for slurred speech Foley present.    Data Reviewed: I have personally reviewed following labs and imaging studies  CBC: Recent Labs  Lab  01/17/20 0513 01/17/20 0513 01/18/20 0222 01/18/20 0222 01/19/20 0457 01/20/20 0559 01/21/20 0412 01/22/20 0628 01/23/20 0646  WBC 9.7   < > 13.1*   < > 12.5* 12.9* 10.3 12.3* 13.4*  NEUTROABS 7.3  --  9.4*  --   --   --   --   --   --   HGB 11.6*   < > 11.0*   < > 10.7* 9.9* 9.2* 8.5* 9.1*  HCT 36.2*   < > 34.4*   < > 34.5* 31.6* 27.9* 24.6* 28.5*  MCV 89.6   < > 88.9   < > 92.5 91.1 88.3 85.4 89.1  PLT 144*   < > 172   < > 118* 116* PLATELET CLUMPING, SUGGEST RECOLLECTION OF SAMPLE IN CITRATE TUBE. 139* 182   < > = values in this interval not displayed.   Basic Metabolic Panel: Recent Labs  Lab 01/19/20 0457 01/20/20 0559 01/21/20 0412 01/22/20 0628 01/23/20 0425  NA 144 142 141 136 135  K 3.3* 4.1 3.7 3.3* 3.9  CL 109 115* 112* 107 109  CO2 22 19* 21* 19* 19*  GLUCOSE 185* 127* 112* 107* 69*  BUN 54* 43* 32* 24* 16  CREATININE 1.98* 1.70* 1.41* 1.38* 1.16  CALCIUM 8.5* 8.1* 8.0* 8.5* 8.3*  MG 2.5* 2.3 2.2 2.2 2.5*   GFR: Estimated Creatinine Clearance: 61.8 mL/min (by C-G formula based  on SCr of 1.16 mg/dL). Liver Function Tests: No results for input(s): AST, ALT, ALKPHOS, BILITOT, PROT, ALBUMIN in the last 168 hours. No results for input(s): LIPASE, AMYLASE in the last 168 hours. No results for input(s): AMMONIA in the last 168 hours. Coagulation Profile: No results for input(s): INR, PROTIME in the last 168 hours. Cardiac Enzymes: No results for input(s): CKTOTAL, CKMB, CKMBINDEX, TROPONINI in the last 168 hours. BNP (last 3 results) No results for input(s): PROBNP in the last 8760 hours. HbA1C: No results for input(s): HGBA1C in the last 72 hours. CBG: Recent Labs  Lab 01/22/20 1154 01/22/20 1633 01/22/20 2117 01/23/20 0759 01/23/20 1147  GLUCAP 131* 86 87 79 128*   Lipid Profile: No results for input(s): CHOL, HDL, LDLCALC, TRIG, CHOLHDL, LDLDIRECT in the last 72 hours. Thyroid Function Tests: No results for input(s): TSH, T4TOTAL, FREET4, T3FREE,  THYROIDAB in the last 72 hours. Anemia Panel: No results for input(s): VITAMINB12, FOLATE, FERRITIN, TIBC, IRON, RETICCTPCT in the last 72 hours. Sepsis Labs: Recent Labs  Lab 01/18/20 0222  PROCALCITON 0.80    Recent Results (from the past 240 hour(s))  MRSA PCR Screening     Status: None   Collection Time: 01/16/20  9:57 AM   Specimen: Nasal Mucosa; Nasopharyngeal  Result Value Ref Range Status   MRSA by PCR NEGATIVE NEGATIVE Final    Comment:        The GeneXpert MRSA Assay (FDA approved for NASAL specimens only), is one component of a comprehensive MRSA colonization surveillance program. It is not intended to diagnose MRSA infection nor to guide or monitor treatment for MRSA infections. Performed at [facility], [address] [place], [place] [address]   CULTURE, BLOOD (ROUTINE X 2) w Reflex to ID Panel     Status: None   Collection Time: 01/17/20  9:02 AM   Specimen: BLOOD  Result Value Ref Range Status   Specimen Description BLOOD RIGHT [facility]  Final   Special Requests   Final    BOTTLES DRAWN AEROBIC AND ANAEROBIC Blood Culture adequate volume   Culture   Final    NO GROWTH 5 DAYS Performed at [facility], [address] [place], [place] [address]    Report Status 01/22/2020 FINAL  Final  CULTURE, BLOOD (ROUTINE X 2) w Reflex to ID Panel     Status: None   Collection Time: 01/17/20  9:10 AM   Specimen: BLOOD  Result Value Ref Range Status   Specimen Description BLOOD LEFT [facility]  Final   Special Requests   Final    BOTTLES DRAWN AEROBIC AND ANAEROBIC Blood Culture adequate volume   Culture   Final    NO GROWTH 5 DAYS Performed at [facility], [address] [place], [place] [address]    Report Status 01/22/2020 FINAL  Final         Radiology Studies: No results found.      Scheduled Meds:  amiodarone  400 mg Oral BID   apixaban  5 mg Oral BID   vitamin C  500 mg Oral BID   aspirin EC  81 mg Oral Daily    atorvastatin  40 mg Oral QHS   Chlorhexidine Gluconate Cloth  6 each Topical Daily   cholecalciferol  1,000 Units Oral Daily   clonazePAM  0.5 mg Oral BID   clopidogrel  75 mg Oral Daily   diphenhydrAMINE  50 mg Oral QHS   DULoxetine  20 mg Oral Daily  famotidine  20 mg Oral Daily   feeding supplement (ENSURE ENLIVE)  237 mL Oral TID BM   fluticasone  2 spray Each Nare Daily   gabapentin  200 mg Oral TID   haloperidol  1 mg Oral BID   insulin aspart  0-15 Units Subcutaneous TID WC   insulin glargine  8 Units Subcutaneous Daily   [START ON 01/24/2020] multivitamin with minerals  1 tablet Oral Daily   OLANZapine zydis  12.5 mg Oral QHS   senna-docusate  2 tablet Oral BID   tamsulosin  0.4 mg Oral Daily   Continuous Infusions:  sodium chloride Stopped (01/15/20 0913)     LOS: 11 days     Enzo Bi, MD [facility]   To contact the attending provider between 7A-7P or the covering provider during after hours 7P-7A, please log into the web site [email or web address] and access using universal [place] password for that web site. If you do not have the password, please call the hospital operator.  01/23/2020, 3:26 PM

## 2020-01-23 NOTE — Consult Note (Addendum)
Neurology Consultation Reason for Consult: AMS, slurred speech Referring Physician: Enzo Bi  CC:   History is obtained from: Chart review and primary team  HPI: KAVEH KISSINGER is a 69 y.o. male with a past medical history significant for type 2 diabetes (A1c 7.9% on 01/17/2020), hypertension, hyperlipidemia, atrial fibrillation (on Eliquis and amiodarone, resumed after amputation on 8/13), B12 deficiency (no recent level, not currently being repleted), osteomyelitis of left great toe (s/p surgical amputation 8/13), prior stroke (left-sided predominantly), coronary artery disease (on asa and plavix, status post CABG), heart failure (EF 40 to 45%, prior reduced EF status post pacemaker), chronic kidney disease, depression, suicidal behavior  He presented on 8/11 after being found by his daughter confused with more of his Flexeril and tramadol than expected missing (concern for accidental versus intentional overdose)  Psychiatry has been following for concern for confusional delirium.  They note that he is most likely heading towards nursing home due to history of inappropriate sexual behavior in the family.  He has been hospitalized since 8/11, becoming febrile on 8/15, treated broadly initially with meropenem and vancomycin, narrowed to cefepime, metronidazole and vancomycin on  8/17, with last recorded fever on 8/18, antibiotics discontinued on 8/19.  Notably he has a rising leukocytosis in the past several days.  Regarding psychoactive medications, he has been getting Benadryl 50 mg daily at bedtime, gabapentin 200 mg twice daily, todays scheduled for haloperidol 1 mg twice daily, olanzapine 10 mg nightly increased to 12.5 mg nightly for tonight's dose, Ativan 1 mg last administered 8/17, clonazepam 0.5 mg twice daily (started today) and Cymbalta (started today) per psychiatry. Dr. Janese Banks (psychiatry) is additionally managing his benadryl to help promote sleep at night.  Bedside sitter reports  that he did not sleep at all last night, and that he is intermittently very wild and agitated, will not follow any commands, and will in fact do the opposite of what he is asked to do.  ROS: Unable to assess full review of systems secondary to patient's mental status, reports no headache, reports some mild stomach pain but not tender to palpation and reports it is just "a little bit"  Past Medical History:  Diagnosis Date  . Allergy   . Atrial fibrillation ([place])   . B12 deficiency   . Chronic kidney disease   . Chronic kidney disease (CKD), stage II (mild)   . Coronary atherosclerosis   . Diabetes mellitus without complication ([place])   . Elevated PSA   . Esophagitis   . GERD (gastroesophageal reflux disease)   . Heart attack ([place])   . Heart disease   . Hyperlipidemia   . Hypertension   . Hypokalemia   . Mild neurocognitive disorder   . Osteoarthritis   . Presbyopia   . PVD (peripheral vascular disease) ([place])   . Restless leg syndrome   . Stroke (cerebrum) ([place])   . Stroke ([place])   . Subacute osteomyelitis ([place])   . Tinea unguium   . Uncompensated short term memory deficit     Family History  Problem Relation Age of Onset  . Heart failure Mother   . Colon cancer Mother   . Diabetes Mother   . Thyroid disease Sister    Social History:  reports that he has never smoked. He has never used smokeless tobacco. He reports that he does not drink alcohol and does not use drugs.  Exam: Current vital signs: BP 113/67 (BP Location: Left Arm)   Pulse 81   Temp  98.2 F (36.8 C) (Oral)   Resp (!) 22   Ht 5\' 9"  (1.753 m)   Wt 77.5 kg   SpO2 98%   BMI 25.23 kg/m  Vital signs in last 24 hours: Temp:  [97.6 F (36.4 C)-98.2 F (36.8 C)] 98.2 F (36.8 C) (08/23 1444) Pulse Rate:  [81-93] 81 (08/23 1444) Resp:  [17-22] 22 (08/23 1444) BP: (112-129)/(55-70) 113/67 (08/23 1444) SpO2:  [90 %-98 %] 98 % (08/23 1444) Weight:  [77.5 kg] 77.5 kg (08/23 0500)   Physical Exam   Constitutional: Appears chronically ill,  Psych: Too somnolent to assess psychiatric affect, but does not make a joke when aroused briefly (reports " I am married to one" with a right smile when asked if he has a headache) Eyes: No scleral injection HENT: No OP obstruction, but very dry mucous membranes MSK: Muscle wasting throughout Cardiovascular: Normal rate and regular rhythm, bilaterally palpable radial pulses though the right is a little weaker Respiratory: Effort normal, non-labored breathing GI: Soft.  No distension. There is no tenderness.  Skin: bandages on bilateral feet as well as right upper extremity  Neuro: Mental Status: Patient is deeply asleep, arouses to sustained noxious stimulation and then voice.  He is able to briefly follow simple commands such as stick out your tongue.  His speech is very dysarthric. Cranial Nerves: II: Visual Fields are full. Pupils are equal, round, and reactive to light.  III,IV, VI: Disconjugate gaze when sleeping, conjugate and looks bilaterally left and right.  V: Corneals are symmetric to eyelash brush VII: Facial movement is symmetric to noxious stim VIII: hearing is intact to voice X: Uvula elevates symmetrically XI: Head turning is symmetric in strength, 5 out of 5 XII: tongue is midline without atrophy or fasciculations.  Motor: Tone is normal when asleep. Bulk is diffusely low.  Moves all extremities equally and at least antigravity, resists movement of upper extremities when awake with 5 out of 5 strength grossly throughout Sensory: Equally reactive to touch in all 4 extremities Reflexes: Not assessed Cerebellar: Unable to assess secondary to patient's mental status   I have reviewed labs in epic and the results pertinent to this consultation are: Worsening leukocytosis to 13.4 on 8/22 Anemia (9.1) with increased RDW of 15.6 Ammonia of 14 on 8/14 No recent B12 level or thiamine level HIV negative on 03/12/2019 No syphilis  screening on file Ethanol undetectable at admission, urine tox  for tricyclic  I have reviewed the images obtained: Head CT w/ chronic changes   Impression: Mr. Takeshita is a 69 year old gentleman with multiple stroke risk factors as above, chronically ill, at significant risk of vascular dementia given his risk factors as well as his significant prior strokes.  He is also significantly at risk for nutritional deficiencies contributing to his mental status given his chronically ill and wasted appearance.  He is at risk for posterior epilepsy, so will obtain routine EEG to rule out nonconvulsive status epilepticus (though initiation of benzodiazepines today will cloud whether he may have had this earlier). 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   Recommendations: - Added on labs: B12, thiamine, MMA, RPR,  - empiric B12 1000 mcg IM daily x 7 days, then daily 1000 mcg - empiric thiamine 500 mcg q8hr for 3 days, then 100 daily - EEG (routine) - Repeat HCT   - appreciate psychiatry involvement and defer to their judgement for risk-benefit eval of use of benzos in this situation and titration of psychiatric medications  including benadryl.   Lesleigh Noe MD-PhD [facility] [phone]  Addended for charge capture

## 2020-01-23 NOTE — Progress Notes (Signed)
Physical Therapy Treatment Patient Details Name: Jason Montes MRN: [id number] DOB: 1950-08-05 Today's Date: 01/23/2020    History of Present Illness Jason Montes is a 33yoM who comes to [facility] 8/11 c AMS, overdose. Patientseen by podiatry as he was scheduled for procedure for partial left first ray amputation on 8/13.  Patient did not have cardiology clearance and needed it prior to surgery. Pt underwent Lt hallux amputation on 8/13, revascularization procedureon 8/16. PMH: HTN, HLD, DM, CVA, GERD, depression, RLS, PVD, CAD, CKD-3, AF on Eliquis, chronic Lt hallux osteomyelitis, sCHF with EF of 40-45%    PT Comments    Pt in bed with sitter in room.  Participated in exercises as described below.  Attempted to sit EOB with mod a x 1 but returned to supine to void despite catheter.  Pt noted to have increased confusion today, difficulty word finding, slurred speech and x 1 asked who the "young man" in the room was.  Given above, further mobility was defered for pt and staff safety.  Sitter reported pt had poor sleep last night.    Will discuss with MD and RN.   Follow Up Recommendations  Supervision/Assistance - 24 hour;Supervision for mobility/OOB;Other (comment)     Equipment Recommendations  None recommended by PT    Recommendations for Other Services       Precautions / Restrictions Precautions Precautions: Fall Precaution Comments: Suicide precautions Required Braces or Orthoses: Other Brace Other Brace: postop shoe Restrictions Weight Bearing Restrictions: Yes RLE Weight Bearing: Weight bearing as tolerated LLE Weight Bearing: Weight bearing as tolerated LLE Partial Weight Bearing Percentage or Pounds: post op shoe wb heel only L.  does better with regular shoe on R    Mobility  Bed Mobility Overal bed mobility: Needs Assistance Bed Mobility: Supine to Sit     Supine to sit: Mod assist     General bed mobility comments: did not get fully upright before laying  back down to void despite havig catheter  Transfers                    Ambulation/Gait             General Gait Details: deferred   Stairs             Wheelchair Mobility    Modified Rankin (Stroke Patients Only)       Balance                                            Cognition Arousal/Alertness: Lethargic Behavior During Therapy: WFL for tasks assessed/performed Overall Cognitive Status: Impaired/Different from baseline                                 General Comments: confusion noted again today, slurring words and difficulty word finding at times.      Exercises Other Exercises Other Exercises: supine SLR and heel slides x 10    General Comments        Pertinent Vitals/Pain Pain Assessment: No/denies pain    Home Living                      Prior Function            PT Goals (current goals can now be found in the  care plan section) Progress towards PT goals: Progressing toward goals    Frequency    7X/week      PT Plan Current plan remains appropriate;Other (comment)    Co-evaluation              AM-PAC PT "6 Clicks" Mobility   Outcome Measure  Help needed turning from your back to your side while in a flat bed without using bedrails?: Total Help needed moving from lying on your back to sitting on the side of a flat bed without using bedrails?: Total Help needed moving to and from a bed to a chair (including a wheelchair)?: Total Help needed standing up from a chair using your arms (e.g., wheelchair or bedside chair)?: Total Help needed to walk in hospital room?: Total Help needed climbing 3-5 steps with a railing? : Total 6 Click Score: 6    End of Session   Activity Tolerance: Patient limited by lethargy Patient left: in bed;with call bell/phone within reach;with bed alarm set;with nursing/sitter in room Nurse Communication: Mobility status;Other (comment)        Time: 0950-1000 PT Time Calculation (min) (ACUTE ONLY): 10 min  Charges:  $Therapeutic Exercise: 8-22 mins                    Chesley Noon, PTA 01/23/20, 10:10 AM

## 2020-01-23 NOTE — Progress Notes (Signed)
SUBJECTIVE: Patient resting comfortably.  Denies shortness of breath, chest pain, or palpitations.   Vitals:   01/22/20 2002 01/23/20 0100 01/23/20 0500 01/23/20 0727  BP: 129/70   (!) 115/55  Pulse: 93   85  Resp: 17     Temp: 98 F (36.7 C)   97.6 F (36.4 C)  TempSrc: Oral   Oral  SpO2: 95%   90%  Weight:   77.5 kg   Height:  5\' 9"  (1.753 m)      Intake/Output Summary (Last 24 hours) at 01/23/2020 1006 Last data filed at 01/22/2020 2150 Gross per 24 hour  Intake 480 ml  Output 1202 ml  Net -722 ml    LABS: Basic Metabolic Panel: Recent Labs    01/22/20 0628 01/23/20 0425  NA 136 135  K 3.3* 3.9  CL 107 109  CO2 19* 19*  GLUCOSE 107* 69*  BUN 24* 16  CREATININE 1.38* 1.16  CALCIUM 8.5* 8.3*  MG 2.2 2.5*   Liver Function Tests: No results for input(s): AST, ALT, ALKPHOS, BILITOT, PROT, ALBUMIN in the last 72 hours. No results for input(s): LIPASE, AMYLASE in the last 72 hours. CBC: Recent Labs    01/22/20 0628 01/23/20 0646  WBC 12.3* 13.4*  HGB 8.5* 9.1*  HCT 24.6* 28.5*  MCV 85.4 89.1  PLT 139* 182   Cardiac Enzymes: No results for input(s): CKTOTAL, CKMB, CKMBINDEX, TROPONINI in the last 72 hours. BNP: Invalid input(s): POCBNP D-Dimer: No results for input(s): DDIMER in the last 72 hours. Hemoglobin A1C: No results for input(s): HGBA1C in the last 72 hours. Fasting Lipid Panel: No results for input(s): CHOL, HDL, LDLCALC, TRIG, CHOLHDL, LDLDIRECT in the last 72 hours. Thyroid Function Tests: No results for input(s): TSH, T4TOTAL, T3FREE, THYROIDAB in the last 72 hours.  Invalid input(s): FREET3 Anemia Panel: No results for input(s): VITAMINB12, FOLATE, FERRITIN, TIBC, IRON, RETICCTPCT in the last 72 hours.   PHYSICAL EXAM General: Well developed, well nourished, in no acute distress HEENT:  Normocephalic and atramatic Neck:  No JVD.  Lungs: Clear bilaterally to auscultation and percussion. Heart: HRRR. Normal S1 and S2 without gallops or  murmurs.  Abdomen: Bowel sounds are positive, abdomen soft and non-tender  Msk:  Back normal, normal gait. Normal strength and tone for age. Extremities: No clubbing, cyanosis or edema.   Neuro: Alert and oriented X 2 Psych:  Good affect, responds appropriately  TELEMETRY: Not on monitor  ASSESSMENT AND PLAN: Patient continues to remain in sinus rhythm, please continue eliquis and amiodarone which will be further titrated as an outpatient.  Patient's chronic HFrEF will also be further managed as an outpatient.  Will sign off at this time.  Please ensure patient has follow-up appointment at discharge.  Principal Problem:   Acute metabolic encephalopathy Active Problems:   Atrial fibrillation (HCC)   CAD (coronary artery disease)   GERD with esophagitis   Chronic systolic CHF (congestive heart failure) (HCC)   Hyperlipidemia   Essential hypertension   Acute renal failure superimposed on stage 3a chronic kidney disease (HCC)   Stage 3a chronic kidney disease   Osteomyelitis of great toe of left foot ([place])   Type II diabetes mellitus with renal manifestations ([place])   Stroke ([place])   Depression   Suicidal behavior   Pressure injury of skin    Adaline Sill, NP-C 01/23/2020 10:06 AM

## 2020-01-23 NOTE — Progress Notes (Signed)
Initial Nutrition Assessment  DOCUMENTATION CODES:   Severe malnutrition in context of chronic illness  INTERVENTION:   Ensure Enlive po TID, each supplement provides 350 kcal and 20 grams of protein  MVI daily   Vitamin C 586m po BID  Liberalize diet (finger foods)  NUTRITION DIAGNOSIS:   Severe Malnutrition related to chronic illness (CKD, CHF, DM) as evidenced by severe muscle depletion, severe fat depletion.  GOAL:   Patient will meet greater than or equal to 90% of their needs  MONITOR:   PO intake, Supplement acceptance, Labs, Weight trends, Skin, I & O's  REASON FOR ASSESSMENT:   LOS    ASSESSMENT:   69y.o. Caucasian male with medical history significant of hypertension, hyperlipidemia, diabetes mellitus, stroke, GERD, depression, RLS, PVD, CAD, CKD-3, atrial fibrillation on Eliquis, chronic left greater toe osteomyelitis, sCHF with EF of 40-45%, who presents with altered mental status and overdose   Met with pt in room today. Pt laying in bed with sitter at bedside. Pt reports poor appetite and oral intake in hospital; pt documented to be eating <25% of meals. Sitter at bedside requesting finger foods as she reports that patient does not like to be fed. Pt reports that he enjoys strawberry and vanilla supplements. RD will add supplements and vitamins to help pt meet his estimated needs and to support wound healing. RD will also order pt to have a finger foods. RD will monitor pt's oral intake and add back restrictions if needed once pt's oral intake improves. Per chart, pt down 8lbs(5%) over the past 4 months.   Medications reviewed and include: aspirin, D3, plavix, pepcid, insulin, senokot  Labs reviewed: wbc- 13.4(H), Hgb 9.1(L), Hct 28.5(L) cbgs- 79, 128 x 24 hrs AIC- 7.9(H)- 8/17  NUTRITION - FOCUSED PHYSICAL EXAM:    Most Recent Value  Orbital Region Severe depletion  Upper Arm Region Severe depletion  Thoracic and Lumbar Region Severe depletion   Buccal Region Severe depletion  Temple Region Severe depletion  Clavicle Bone Region Severe depletion  Clavicle and Acromion Bone Region Severe depletion  Scapular Bone Region Severe depletion  Dorsal Hand Severe depletion  Patellar Region Severe depletion  Anterior Thigh Region Severe depletion  Posterior Calf Region Severe depletion  Edema (RD Assessment) Mild  Hair Reviewed  Eyes Reviewed  Mouth Reviewed  Skin Reviewed  Nails Reviewed     Diet Order:   Diet Order            Diet heart healthy/carb modified Room service appropriate? Yes; Fluid consistency: Thin  Diet effective now                EDUCATION NEEDS:   Education needs have been addressed  Skin:  Skin Assessment: Reviewed RN Assessment (Stage II sacrum, incision L foot, skin tear R hand)  Last BM:  8/22- TYPE 1  Height:   Ht Readings from Last 1 Encounters:  01/23/20 _0  (1.753 m)    Weight:   Wt Readings from Last 1 Encounters:  01/23/20 77.5 kg    Ideal Body Weight:  72.7 kg  BMI:  Body mass index is 25.23 kg/m.  Estimated Nutritional Needs:   Kcal:  2000-2300kcal/day  Protein:  100-115g/day  Fluid:  1.8-2.1L/day  CKoleen DistanceMS, RD, LDN Please refer to [facility]for RD and/or RD on-call/weekend/after hours pager
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
## 2020-01-24 DIAGNOSIS — R569 Unspecified convulsions: Secondary | ICD-10-CM

## 2020-01-24 LAB — CBC
HCT: 27.7 % — ABNORMAL LOW (ref 39.0–52.0)
Hemoglobin: 9 g/dL — ABNORMAL LOW (ref 13.0–17.0)
MCH: 28.3 pg (ref 26.0–34.0)
MCHC: 32.5 g/dL (ref 30.0–36.0)
MCV: 87.1 fL (ref 80.0–100.0)
Platelets: 174 10*3/uL (ref 150–400)
RBC: 3.18 MIL/uL — ABNORMAL LOW (ref 4.22–5.81)
RDW: 15.7 % — ABNORMAL HIGH (ref 11.5–15.5)
WBC: 10.5 10*3/uL (ref 4.0–10.5)
nRBC: 0 % (ref 0.0–0.2)

## 2020-01-24 LAB — GLUCOSE, CAPILLARY
Glucose-Capillary: 98 mg/dL (ref 70–99)
Glucose-Capillary: 125 mg/dL — ABNORMAL HIGH (ref 70–99)
Glucose-Capillary: 148 mg/dL — ABNORMAL HIGH (ref 70–99)
Glucose-Capillary: 160 mg/dL — ABNORMAL HIGH (ref 70–99)

## 2020-01-24 LAB — BASIC METABOLIC PANEL
Anion gap: 8 (ref 5–15)
BUN: 15 mg/dL (ref 8–23)
CO2: 22 mmol/L (ref 22–32)
Calcium: 8.3 mg/dL — ABNORMAL LOW (ref 8.9–10.3)
Chloride: 106 mmol/L (ref 98–111)
Creatinine, Ser: 1.17 mg/dL (ref 0.61–1.24)
GFR calc Af Amer: >60 mL/min (ref >60)
GFR calc non Af Amer: >60 mL/min (ref >60)
Glucose, Bld: 104 mg/dL — ABNORMAL HIGH (ref 70–99)
Potassium: 3.8 mmol/L (ref 3.5–5.1)
Sodium: 136 mmol/L (ref 135–145)

## 2020-01-24 LAB — MAGNESIUM: Magnesium: 2.6 mg/dL — ABNORMAL HIGH (ref 1.7–2.4)

## 2020-01-24 LAB — RPR: RPR Ser Ql: NONREACTIVE

## 2020-01-24 MED ORDER — VITAMIN B-12 1000 MCG PO TABS
1000.0000 ug | ORAL_TABLET | Freq: Every day | ORAL | Status: DC
  Administered 2020-01-24 – 2020-02-03 (×11): 1000 ug via ORAL
  Filled 2020-01-24 (×11): qty 1

## 2020-01-24 NOTE — Progress Notes (Signed)
PT Cancellation Note  Patient Details Name: Jason Montes MRN: [id number] DOB: September 26, 1950   Cancelled Treatment:    Reason Eval/Treat Not Completed: Fatigue/lethargy limiting ability to participate   Session attempted this am.  Pt asleep.  Sitter in room stated pt has been sleeping soundly and has not awaken for breakfast or meds despite attempts by nursing.  Pt did not stir during discussion with sitter.  Will attempt again at a later time/date.   Chesley Noon 01/24/2020, 9:10 AM

## 2020-01-24 NOTE — Progress Notes (Signed)
SUBJECTIVE: lethargic   Vitals:   01/23/20 1444 01/23/20 2047 01/24/20 0300 01/24/20 0516  BP: 113/67 108/64  (!) 105/57  Pulse: 81 76  81  Resp: (!) 22 (!) 22  (!) 22  Temp: 98.2 F (36.8 C) (!) 97.5 F (36.4 C)  98.6 F (37 C)  TempSrc: Oral Oral    SpO2: 98% 92%  93%  Weight:   78.3 kg   Height:        Intake/Output Summary (Last 24 hours) at 01/24/2020 0954 Last data filed at 01/24/2020 0445 Gross per 24 hour  Intake 50 ml  Output 1710 ml  Net -1660 ml    LABS: Basic Metabolic Panel: Recent Labs    01/23/20 0425 01/24/20 0450  NA 135 136  K 3.9 3.8  CL 109 106  CO2 19* 22  GLUCOSE 69* 104*  BUN 16 15  CREATININE 1.16 1.17  CALCIUM 8.3* 8.3*  MG 2.5* 2.6*   Liver Function Tests: No results for input(s): AST, ALT, ALKPHOS, BILITOT, PROT, ALBUMIN in the last 72 hours. No results for input(s): LIPASE, AMYLASE in the last 72 hours. CBC: Recent Labs    01/23/20 0646 01/24/20 0450  WBC 13.4* 10.5  HGB 9.1* 9.0*  HCT 28.5* 27.7*  MCV 89.1 87.1  PLT 182 174   Cardiac Enzymes: No results for input(s): CKTOTAL, CKMB, CKMBINDEX, TROPONINI in the last 72 hours. BNP: Invalid input(s): POCBNP D-Dimer: No results for input(s): DDIMER in the last 72 hours. Hemoglobin A1C: No results for input(s): HGBA1C in the last 72 hours. Fasting Lipid Panel: No results for input(s): CHOL, HDL, LDLCALC, TRIG, CHOLHDL, LDLDIRECT in the last 72 hours. Thyroid Function Tests: No results for input(s): TSH, T4TOTAL, T3FREE, THYROIDAB in the last 72 hours.  Invalid input(s): FREET3 Anemia Panel: Recent Labs    01/23/20 1555  VITAMINB12 873     PHYSICAL EXAM General: Well developed, well nourished, in no acute distress HEENT:  Normocephalic and atramatic Neck:  No JVD.  Lungs: Clear bilaterally to auscultation and percussion. Heart: HRRR . Normal S1 and S2 without gallops or murmurs.  Abdomen: Bowel sounds are positive, abdomen soft and non-tender  Msk:  Back normal,  normal gait. Normal strength and tone for age. Extremities: No clubbing, cyanosis or edema.   Neuro: Alert and oriented X 3. Psych:  Good affect, responds appropriately  TELEMETRY: NSVT  ASSESSMENT AND PLAN: NSVT, continue amiodrone, asymptomatic.  Principal Problem:   Acute metabolic encephalopathy Active Problems:   Atrial fibrillation (HCC)   CAD (coronary artery disease)   GERD with esophagitis   Chronic systolic CHF (congestive heart failure) (HCC)   Hyperlipidemia   Essential hypertension   Acute renal failure superimposed on stage 3a chronic kidney disease (HCC)   Protein-calorie malnutrition, severe (HCC)   Stage 3a chronic kidney disease   Osteomyelitis of great toe of left foot (HCC)   Type II diabetes mellitus with renal manifestations ([place])   Stroke ([place])   Depression   Suicidal behavior   Pressure injury of skin    Jason Montes A, MD, [facility] 01/24/2020 9:54 AM

## 2020-01-24 NOTE — Procedures (Signed)
Patient Name: Jason Montes  MRN: [id number]  Epilepsy Attending: Lora Havens  Referring Physician/Provider: Dr Lesleigh Noe Date: 01/24/2020 Duration: 22.15 minutes  Patient history: 69 year old male with altered mental status.  EEG to evaluate for seizures.  Level of alertness: Awake, asleep  AEDs during EEG study: Gabapentin  Technical aspects: This EEG study was done with scalp electrodes positioned according to the 10-20 International system of electrode placement. Electrical activity was acquired at a sampling rate of 500Hz  and reviewed with a high frequency filter of 70Hz  and a low frequency filter of 1Hz . EEG data were recorded continuously and digitally stored.   Description: The posterior dominant rhythm consists of 8 Hz activity of moderate voltage (25-35 uV) seen predominantly in posterior head regions, symmetric and reactive to eye opening and eye closing. Sleep was characterized by vertex waves, sleep spindles (12 to 14 Hz), maximal frontocentral region.  EEG showed continuous generalized 3 to 6 Hz theta-delta slowing. Hyperventilation and photic stimulation were not performed.     ABNORMALITY -Continuous slow, generalized  IMPRESSION: This study is suggestive of mild to moderate diffuse encephalopathy, nonspecific etiology.  No seizures or epileptiform discharges were seen throughout the recording.  Jessamine Barcia Barbra Sarks

## 2020-01-24 NOTE — Progress Notes (Addendum)
Neurology Progress Note  Patient ID: Jason Montes is a 69 y.o. on whom we were consulted for AMS with PMHx of type 2 diabetes (A1c 7.9% on 01/17/2020), hypertension, hyperlipidemia, atrial fibrillation (on Eliquis and amiodarone, resumed after amputation on 8/13), B12 deficiency (no recent level, not currently being repleted), osteomyelitis of left great toe (s/p surgical amputation 8/13), prior stroke (left-sided predominantly), coronary artery disease (on asa and plavix, status post CABG), heart failure (EF 40 to 45%, prior reduced EF status post pacemaker), chronic kidney disease, depression, suicidal behavior  Major interval events:  - Benadryl reduced from 50 mg to 12.5 mg yesterday then DC'ed for today per primary team - Received 0.25 mg of Klonopin at 1504 and 2210 - Received Haldol at 2214 1 mg (scheduled for 1 mg BID), and olanzapine 12.5 mg at 2214 - Receiving high dose thiamine empirically pending level   Subjective: Much improved and appropriately conversant today.  He denies any acute complaints.  States he is feeling pretty good  Exam: Vitals:   01/23/20 2047 01/24/20 0516  BP: 108/64 (!) 105/57  Pulse: 76 81  Resp: (!) 22 (!) 22  Temp: (!) 97.5 F (36.4 C) 98.6 F (37 C)  SpO2: 92% 93%   Gen: In bed, NAD Resp: non-labored breathing, no acute distress Cardiac: Perfusing extremities well Abd: soft, nt  Neuro: MS: Sleeping but awakens easily, follows commands with alacrity, though he does have some visuopatial confusion when trying to perform motor exam.  Oriented to person, place, though unclear on situation, oriented to year (initially states 2022 and then corrects to 2021) CN: Right eye esophoria, sluggish but reactive pupils bilaterally 2.5 to 2 mm, external ocular movements intact, face with right nasolabial fold flattening baseline per patient, sensation symmetric on face in all three distributions, uvula elevates symmetrically, tongue midline Motor: Mild  myoclonic movements, action triggered, slightly worse on the right upper extremity than the left.  Tone without significant cogwheeling.  Sensory: Equal to light touch in all four extremities  Pertinent Labs: B12 873; MMA pending B1 in process RPR negative Leukocytosis improving today 13 -> 10  Personally reviewed head CT from 01/23/2020 agree that it is stable compared to head CT from 01/11/2020 (also personally re-reviewed)  EEG: 8/24  This study is suggestive of mild to moderate diffuse encephalopathy, nonspecific etiology.  No seizures or epileptiform discharges were seen throughout the recording.  Impression: Jason Montes appears to be improving with the changes in medication made.  His EEG is not consistent with nonconvulsive status epilepticus.  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  I continue to favor resolving mixed hypoactive/hyperactive delirium as etiology of his altered mental status.   Recommendations:  - Continue B12 daily 1000 mcg, PO if awake enough - Continue empiric thiamine 500 mcg q8hr IV for 3 days, then 100 daily - Neurology will follow up B1 level - Appreciate primary team and psychiatry management of his psychiatric medications  Jason Noe MD-PhD [facility] [phone]   Addended for charge capture

## 2020-01-24 NOTE — Progress Notes (Signed)
PROGRESS NOTE    [name]  VZC:[id number] DOB: [birth year] DOA: 01/11/2020 PCP: [facility]   Chief complaint.  Altered mental status.  Brief Narrative:  Jason Fichera [name] a 69 y.o.Caucasian malewith medical history significant ofhypertension, hyperlipidemia, diabetes mellitus, stroke, GERD, depression, RLS, PVD, CAD, CKD-3, atrial fibrillation on Eliquis, chronic left greater toe osteomyelitis,sCHF with EF of 40-45%, who presents with altered mental status and overdose ?unintentional.   Assessment & Plan:   Principal Problem:   Acute metabolic encephalopathy Active Problems:   Atrial fibrillation (HCC)   CAD (coronary artery disease)   GERD with esophagitis   Chronic systolic CHF (congestive heart failure) (HCC)   Hyperlipidemia   Essential hypertension   Acute renal failure superimposed on stage 3a chronic kidney disease (Jason Montes)   Protein-calorie malnutrition, severe (HCC)   Stage 3a chronic kidney disease   Osteomyelitis of great toe of left foot (HCC)   Type II diabetes mellitus with renal manifestations (HCC)   Stroke (Jason Montes)   Depression   Suicidal behavior   Pressure injury of skin   Acute metabolic encephalopathy # Hypoactive/hyperactive delirium # Hx of prior stroke  # 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 --Condition gradually improving.  There was some concern about suicide ideation, patient had been evaluated by psychiatry.  Was started on scheduled Benadryl, Haldol and Zyprexa.   --mental status slowly improving, but still confused  --re-engage psych on 8/23, rec Haldol increased to 1 bid Zyprexa 12.5 qhs Klonopin regular dosing 0.5 bid And Cymbalta to help with depression and anxiety  --Had slurred speech on 8/23, MRI brain can not be done due to pacemaker.  Neuro consult on 8/23.  EEG showed no seizures or epileptiform  discharges.  Repeat head CT unremarkable. PLAN: --d/c Klonopin and Benadryl today due to excessive somnolence and their potential to worsen delirium --continue empiric vit B12 suppl started by neuro  --continue empiric thiamine suppl started by neuro   Reported drug overdose --wound need psych eval for SI prior to discharge.  Osteomyelitis of the left left great toe s/p amputation Developed a fever again on 8/18.  assumed to be secondary to aspiration pneumonia.  Blood culture sent out. --completed IV abx course --amputation site clean and dry with stiches.   --continue dry dressing  Bilateral lower lobe aspiration pneumonia. Probably secondary to altered mental status.  Do not believe patient has an dysphagia.   --completed IV abx course  Hypotension, resolved --systolic 12'I morning of 7/86.  Improved with IVF 500 ml. --BP normalized now PLAN: --Hold MIVF since pt is now alert and taking in food/drink. --continue to hold home coreg, lisinopril and torsemide because BP normal   Urinary retention presumed secondary to benign prostate hypertrophy. Patient had significant urinary tension with residual over 1370 mL.  Foley was inserted and pt has been receiving Flomax. --removed foley 8/21, however, pt failed voiding trial, Foley re-inserted. PLAN: --continue Foley --continue Flomax  Acute kidney injury on chronic kidney disease stage IIIa. --Cr improved to 1.16 today --Hold IVF and encourage oral hydration.  Hypernatremia, resolved Patient received normal saline instead of lactated Ringer's solution during the surgery.    Slight thrombocytopenia, resolved Likely secondary to infection.    # Paroxysmal atrial fibrillation. # Ventricular tachycardia non sustained --currently rate controlled. --treat underlying cause, per cards --no need for tele, per cards --continue amiodarone --continue Eliquis  Chronic systolic congestive heart failure with  moderate pulmonary  hypertension. Currently No evidence of exacerbation.    Uncontrolled type 2 diabetes with hyperglycemia. --hypoglycemia episode early this morning PLAN: --Hold home metformin --Decrease Lantus to 8u daily (down from 12u) --SSI TID with meals  Constipation --s/p High-dose Miralax  --continue miralax BID scheduled  Severe malnutrition in context of chronic illness Ensure Enlive po TID, each supplement provides 350 kcal and 20 grams of protein MVI daily  Vitamin C 500mg  po BID Liberalize diet (finger foods)   DVT prophylaxis: XB:DZHGDJM Code Status: DNR  Family Communication:  Status is: inpatient Dispo:   The patient is from: boarding house Anticipated d/c is to: undetermined Anticipated d/c date is: undetermined Patient currently is not medically stable to d/c due to: significant debility, no safe discharge disposition   Consultants:  Podiatry. Psychiatry   Procedures:  Left hallux amputation    Subjective: Pt was somnolent since yesterday, still sleepy and difficult to awake during rounding.  Per sitter, did wake up briefly to eat some breakfast and then fell back to sleep.   Objective: Vitals:   01/24/20 0300 01/24/20 0516 01/24/20 1248 01/24/20 1305  BP:  (!) 105/57 110/65   Pulse:  81 79   Resp:  (!) 22 20   Temp:  98.6 F (37 C)  98.6 F (37 C)  TempSrc:    Oral  SpO2:  93% 97%   Weight: 78.3 kg     Height:        Intake/Output Summary (Last 24 hours) at 01/24/2020 1458 Last data filed at 01/24/2020 0900 Gross per 24 hour  Intake 410 ml  Output 860 ml  Net -450 ml   Filed Weights   01/22/20 0041 01/23/20 0500 01/24/20 0300  Weight: 77.7 kg 77.5 kg 78.3 kg    Examination:  Constitutional: NAD, sleepy CV: No cyanosis.   RESP: normal respiratory effort  GI: +BS, ND Extremities: No effusions, edema, or tenderness inlower legs. Left great toe amputation site erythematous but without warmth or drainage, stiches present SKIN: warm,  dry and intact   Data Reviewed: I have personally reviewed following labs and imaging studies  CBC: Recent Labs  Lab 01/18/20 0222 01/19/20 0457 01/20/20 0559 01/21/20 0412 01/22/20 0628 01/23/20 0646 01/24/20 0450  WBC 13.1*   < > 12.9* 10.3 12.3* 13.4* 10.5  NEUTROABS 9.4*  --   --   --   --   --   --   HGB 11.0*   < > 9.9* 9.2* 8.5* 9.1* 9.0*  HCT 34.4*   < > 31.6* 27.9* 24.6* 28.5* 27.7*  MCV 88.9   < > 91.1 88.3 85.4 89.1 87.1  PLT 172   < > 116* PLATELET CLUMPING, SUGGEST RECOLLECTION OF SAMPLE IN CITRATE TUBE. 139* 182 174   < > = values in this interval not displayed.   Basic Metabolic Panel: Recent Labs  Lab 01/20/20 0559 01/21/20 0412 01/22/20 0628 01/23/20 0425 01/24/20 0450  NA 142 141 136 135 136  K 4.1 3.7 3.3* 3.9 3.8  CL 115* 112* 107 109 106  CO2 19* 21* 19* 19* 22  GLUCOSE 127* 112* 107* 69* 104*  BUN 43* 32* 24* 16 15  CREATININE 1.70* 1.41* 1.38* 1.16 1.17  CALCIUM 8.1* 8.0* 8.5* 8.3* 8.3*  MG 2.3 2.2 2.2 2.5* 2.6*   GFR: Estimated Creatinine Clearance: 61.3 mL/min (by C-G formula based on SCr of 1.17 mg/dL). Liver Function Tests: No results for input(s): AST, ALT, ALKPHOS, BILITOT, PROT, ALBUMIN in the last  168 hours. No results for input(s): LIPASE, AMYLASE in the last 168 hours. No results for input(s): AMMONIA in the last 168 hours. Coagulation Profile: No results for input(s): INR, PROTIME in the last 168 hours. Cardiac Enzymes: No results for input(s): CKTOTAL, CKMB, CKMBINDEX, TROPONINI in the last 168 hours. BNP (last 3 results) No results for input(s): PROBNP in the last 8760 hours. HbA1C: No results for input(s): HGBA1C in the last 72 hours. CBG: Recent Labs  Lab 01/23/20 1147 01/23/20 1646 01/23/20 2155 01/24/20 0733 01/24/20 1243  GLUCAP 128* 108* 113* 98 125*   Lipid Profile: No results for input(s): CHOL, HDL, LDLCALC, TRIG, CHOLHDL, LDLDIRECT in the last 72 hours. Thyroid Function Tests: No results for input(s):  TSH, T4TOTAL, FREET4, T3FREE, THYROIDAB in the last 72 hours. Anemia Panel: Recent Labs    01/23/20 1555  VITAMINB12 873   Sepsis Labs: Recent Labs  Lab 01/18/20 0222  PROCALCITON 0.80    Recent Results (from the past 240 hour(s))  MRSA PCR Screening     Status: None   Collection Time: 01/16/20  9:57 AM   Specimen: Nasal Mucosa; Nasopharyngeal  Result Value Ref Range Status   MRSA by PCR NEGATIVE NEGATIVE Final    Comment:        The GeneXpert MRSA Assay (FDA approved for NASAL specimens only), is one component of a comprehensive MRSA colonization surveillance program. It is not intended to diagnose MRSA infection nor to guide or monitor treatment for MRSA infections. Performed at [facility], [address] [place], [place] [address]   CULTURE, BLOOD (ROUTINE X 2) w Reflex to ID Panel     Status: None   Collection Time: 01/17/20  9:02 AM   Specimen: BLOOD  Result Value Ref Range Status   Specimen Description BLOOD RIGHT [facility]  Final   Special Requests   Final    BOTTLES DRAWN AEROBIC AND ANAEROBIC Blood Culture adequate volume   Culture   Final    NO GROWTH 5 DAYS Performed at [facility], [address] [place], [place] [address]    Report Status 01/22/2020 FINAL  Final  CULTURE, BLOOD (ROUTINE X 2) w Reflex to ID Panel     Status: None   Collection Time: 01/17/20  9:10 AM   Specimen: BLOOD  Result Value Ref Range Status   Specimen Description BLOOD LEFT [facility]  Final   Special Requests   Final    BOTTLES DRAWN AEROBIC AND ANAEROBIC Blood Culture adequate volume   Culture   Final    NO GROWTH 5 DAYS Performed at [facility], [address], [place], [place] [address]    Report Status 01/22/2020 FINAL  Final         Radiology Studies: EEG  Result Date: 01/24/2020 Lora Havens, MD     01/24/2020  1:46 PM Patient Name: Jason Montes MRN: [id number] Epilepsy Attending: Lora Havens Referring Physician/Provider: Dr  Lesleigh Noe Date: 01/24/2020 Duration: 22.15 minutes Patient history: 69 year old male with altered mental status.  EEG to evaluate for seizures. Level of alertness: Awake, asleep AEDs during EEG study: Gabapentin Technical aspects: This EEG study was done with scalp electrodes positioned according to the 10-20 International system of electrode placement. Electrical activity was acquired at a sampling rate of 500Hz  and reviewed with a high frequency filter of 70Hz  and a low frequency filter of 1Hz . EEG data were recorded continuously and digitally stored. Description: The posterior dominant rhythm consists of 8 Hz  activity of moderate voltage (25-35 uV) seen predominantly in posterior head regions, symmetric and reactive to eye opening and eye closing. Sleep was characterized by vertex waves, sleep spindles (12 to 14 Hz), maximal frontocentral region.  EEG showed continuous generalized 3 to 6 Hz theta-delta slowing. Hyperventilation and photic stimulation were not performed.   ABNORMALITY -Continuous slow, generalized IMPRESSION: This study is suggestive of mild to moderate diffuse encephalopathy, nonspecific etiology.  No seizures or epileptiform discharges were seen throughout the recording. Lora Havens   CT HEAD WO CONTRAST  Result Date: 01/23/2020 CLINICAL DATA:  69 year old male with complicated diabetes (recent foot amputation), atrial fibrillation on Eliquis, prior stroke. Neurologic deficit. EXAM: CT HEAD WITHOUT CONTRAST TECHNIQUE: Contiguous axial images were obtained from the base of the skull through the vertex without intravenous contrast. COMPARISON:  Head CT 01/11/2020. FINDINGS: Brain: Confluent chronic encephalomalacia in the posterior left temporal and occipital lobes again noted. Small chronic infarcts in the left cerebellum are stable. Stable gray-white matter differentiation throughout the brain. No midline shift, ventriculomegaly, mass effect, evidence of mass lesion, intracranial  hemorrhage or evidence of cortically based acute infarction. Vascular: Calcified atherosclerosis at the skull base. No suspicious intracranial vascular hyperdensity. Skull: Motion artifact at the skull base today. No acute osseous abnormality identified. Sinuses/Orbits: Visualized paranasal sinuses and mastoids are stable and well pneumatized. Other: No acute orbit or scalp soft tissue findings. IMPRESSION: 1. No acute intracranial abnormality. 2. Stable non contrast CT appearance of the brain with chronic left PCA and cerebellar infarcts. Electronically Signed   By: Genevie Ann M.D.   On: 01/23/2020 17:29        Scheduled Meds: . amiodarone  400 mg Oral BID  . apixaban  5 mg Oral BID  . vitamin C  500 mg Oral BID  . aspirin EC  81 mg Oral Daily  . atorvastatin  40 mg Oral QHS  . Chlorhexidine Gluconate Cloth  6 each Topical Daily  . cholecalciferol  1,000 Units Oral Daily  . clopidogrel  75 mg Oral Daily  . DULoxetine  20 mg Oral Daily  . famotidine  20 mg Oral Daily  . feeding supplement (ENSURE ENLIVE)  237 mL Oral TID BM  . fluticasone  2 spray Each Nare Daily  . gabapentin  200 mg Oral TID  . haloperidol  1 mg Oral BID  . insulin aspart  0-15 Units Subcutaneous TID WC  . insulin glargine  8 Units Subcutaneous Daily  . multivitamin with minerals  1 tablet Oral Daily  . OLANZapine zydis  12.5 mg Oral QHS  . polyethylene glycol  17 g Oral BID  . tamsulosin  0.4 mg Oral Daily  . [START ON 01/27/2020] thiamine injection  100 mg Intravenous Daily  . vitamin B-12  1,000 mcg Oral Daily   Continuous Infusions: . sodium chloride Stopped (01/15/20 0913)  . thiamine injection 500 mg (01/24/20 1301)     LOS: 12 days     Enzo Bi, MD [facility]   To contact the attending provider between 7A-7P or the covering provider during after hours 7P-7A, please log into the web site [email or web address] and access using universal [place] password for that web site. If you do not have the  password, please call the hospital operator.  01/24/2020, 2:58 PM

## 2020-01-24 NOTE — Progress Notes (Signed)
E.Ouma, NP notified via secure chat of CCMD reported "5 beat run of wide QRS".  Ouma, "noted". No new orders; telemetry continued. Patient asymptomatic. Barbaraann Faster, RN 2:13 AM 01/24/2020

## 2020-01-25 LAB — BASIC METABOLIC PANEL
Anion gap: 7 (ref 5–15)
BUN: 16 mg/dL (ref 8–23)
CO2: 24 mmol/L (ref 22–32)
Calcium: 8.2 mg/dL — ABNORMAL LOW (ref 8.9–10.3)
Chloride: 107 mmol/L (ref 98–111)
Creatinine, Ser: 1.32 mg/dL — ABNORMAL HIGH (ref 0.61–1.24)
GFR calc Af Amer: >60 mL/min (ref >60)
GFR calc non Af Amer: 55 mL/min — ABNORMAL LOW (ref >60)
Glucose, Bld: 119 mg/dL — ABNORMAL HIGH (ref 70–99)
Potassium: 4.1 mmol/L (ref 3.5–5.1)
Sodium: 138 mmol/L (ref 135–145)

## 2020-01-25 LAB — CBC
HCT: 27.3 % — ABNORMAL LOW (ref 39.0–52.0)
Hemoglobin: 8.8 g/dL — ABNORMAL LOW (ref 13.0–17.0)
MCH: 28.2 pg (ref 26.0–34.0)
MCHC: 32.2 g/dL (ref 30.0–36.0)
MCV: 87.5 fL (ref 80.0–100.0)
Platelets: 214 10*3/uL (ref 150–400)
RBC: 3.12 MIL/uL — ABNORMAL LOW (ref 4.22–5.81)
RDW: 15.8 % — ABNORMAL HIGH (ref 11.5–15.5)
WBC: 11 10*3/uL — ABNORMAL HIGH (ref 4.0–10.5)
nRBC: 0 % (ref 0.0–0.2)

## 2020-01-25 LAB — GLUCOSE, CAPILLARY
Glucose-Capillary: 109 mg/dL — ABNORMAL HIGH (ref 70–99)
Glucose-Capillary: 293 mg/dL — ABNORMAL HIGH (ref 70–99)
Glucose-Capillary: 229 mg/dL — ABNORMAL HIGH (ref 70–99)
Glucose-Capillary: 321 mg/dL — ABNORMAL HIGH (ref 70–99)

## 2020-01-25 LAB — MAGNESIUM: Magnesium: 2.5 mg/dL — ABNORMAL HIGH (ref 1.7–2.4)

## 2020-01-25 MED ORDER — THIAMINE HCL 100 MG/ML IJ SOLN
500.0000 mg | Freq: Three times a day (TID) | INTRAVENOUS | Status: AC
  Administered 2020-01-25 – 2020-01-27 (×6): 500 mg via INTRAVENOUS
  Filled 2020-01-25 (×6): qty 5

## 2020-01-25 NOTE — Plan of Care (Signed)
  Problem: Health Behavior/Discharge Planning: Goal: Ability to manage health-related needs will improve Outcome: Not Progressing  He remains confused, but is easily redirected.  Problem: Nutrition: Goal: Adequate nutrition will be maintained Outcome: Progressing  Nutrition status has improved and he is eating more of his meals.

## 2020-01-25 NOTE — Progress Notes (Signed)
PROGRESS NOTE    [name]  JXB:[id number] DOB: [birth year] DOA: 01/11/2020 PCP: [facility]   Chief complaint.  Altered mental status.  Brief Narrative:  Jason Hakim [name] a 69 y.o.Caucasian malewith medical history significant ofhypertension, hyperlipidemia, diabetes mellitus, stroke, GERD, depression, RLS, PVD, CAD, CKD-3, atrial fibrillation on Eliquis, chronic left greater toe osteomyelitis,sCHF with EF of 40-45%, who presents with altered mental status and overdose ?unintentional.   Assessment & Plan:   Principal Problem:   Acute metabolic encephalopathy Active Problems:   Atrial fibrillation (HCC)   CAD (coronary artery disease)   GERD with esophagitis   Chronic systolic CHF (congestive heart failure) (HCC)   Hyperlipidemia   Essential hypertension   Acute renal failure superimposed on stage 3a chronic kidney disease ([place])   Protein-calorie malnutrition, severe (HCC)   Stage 3a chronic kidney disease   Osteomyelitis of great toe of left foot ([place])   Type II diabetes mellitus with renal manifestations ([place])   Stroke ([place])   Depression   Suicidal behavior   Pressure injury of skin  #1.  Acute metabolic encephalopathy, delirium, with history of stroke, history of anoxic brain injury. Patient condition has finally improved.  Currently his mental status is back to baseline.  Neurology has signed off.  #2.  Drug overdose. I have asked Dr. Janese Banks to see patient again to evaluate for possible suicide ideation.  3.  Osteomyelitis of the left great toe.  Status post amputation. Complete antibiotics.  4.  Bilateral lower lobe aspiration pneumonia. Complete antibiotics.  5.  Hypotension. Blood pressure had improved.  Patient also had some orthostatic hypotension yesterday, check cortisol level.  6.  Urinary tract infection secondary to benign prostate hypertrophy. Continue Flomax, continue Foley.  Patient was not able to urinate after his Foley  removal on 8/21.  Patient will continue Foley catheter and follow-up with urology as outpatient.  7.  Chronic systolic congestive heart failure with a moderate pulmonary hypertension. No exacerbation.  8.  Uncontrolled type 2 diabetes with hyperglycemia. Continue sliding scale insulin.  Continue Lantus.  9.  Severe protein calorie malnutrition. Continue supplement.  10 paroxysmal atrial fibrillation with nonsustained ventricular tachycardia. Continue Eliquis and amiodarone.    DVT prophylaxis: Eliquis Code Status: Full Family Communication: None Disposition Plan:  . Patient came from:            . Anticipated d/c place: ?  . Barriers to d/c OR conditions which need to be met to effect a safe d/c:   Consultants:   Psychiatry and neurology.  Procedures:Foley Antimicrobials:None  Subjective: Patient condition has improved.  Currently patient has no longer has any confusion.  Good appetite without nausea vomiting.  No fever or chills.  He had some dizziness yesterday when he was walking.  Objective: Vitals:   01/24/20 1305 01/24/20 2043 01/25/20 0531 01/25/20 1205  BP:  (!) 99/58 (!) 96/58 110/64  Pulse:  81 78 79  Resp:  $Remo'20 17 20  'sXAzy$ Temp: 98.6 F (37 C) 97.9 F (36.6 C) 97.9 F (36.6 C) 97.6 F (36.4 C)  TempSrc: Oral   Oral  SpO2:  93% 93% 96%  Weight:      Height:        Intake/Output Summary (Last 24 hours) at 01/25/2020 1431 Last data filed at 01/25/2020 0529 Gross per 24 hour  Intake --  Output 1050 ml  Net -1050 ml   Filed Weights   01/22/20 0041 01/23/20 0500 01/24/20 0300  Weight: 77.7 kg 77.5  kg 78.3 kg    Examination:  General exam: Appears calm and comfortable  Respiratory system: Clear to auscultation. Respiratory effort normal. Cardiovascular system: Irregular. No JVD, murmurs, rubs, gallops or clicks. No pedal edema. Gastrointestinal system: Abdomen is nondistended, soft and nontender. No organomegaly or masses felt. Normal bowel sounds  heard. Central nervous system: Alert and oriented. No focal neurological deficits. Extremities: Symmetric 5 x 5 power. Skin: No rashes, lesions or ulcers Psychiatry: Judgement and insight appear normal. Mood & affect appropriate.     Data Reviewed: I have personally reviewed following labs and imaging studies  CBC: Recent Labs  Lab 01/21/20 0412 01/22/20 0628 01/23/20 0646 01/24/20 0450 01/25/20 0520  WBC 10.3 12.3* 13.4* 10.5 11.0*  HGB 9.2* 8.5* 9.1* 9.0* 8.8*  HCT 27.9* 24.6* 28.5* 27.7* 27.3*  MCV 88.3 85.4 89.1 87.1 87.5  PLT PLATELET CLUMPING, SUGGEST RECOLLECTION OF SAMPLE IN CITRATE TUBE. 139* 182 174 654   Basic Metabolic Panel: Recent Labs  Lab 01/21/20 0412 01/22/20 0628 01/23/20 0425 01/24/20 0450 01/25/20 0520  NA 141 136 135 136 138  K 3.7 3.3* 3.9 3.8 4.1  CL 112* 107 109 106 107  CO2 21* 19* 19* 22 24  GLUCOSE 112* 107* 69* 104* 119*  BUN 32* 24* $Remov'16 15 16  'YmlLjh$ CREATININE 1.41* 1.38* 1.16 1.17 1.32*  CALCIUM 8.0* 8.5* 8.3* 8.3* 8.2*  MG 2.2 2.2 2.5* 2.6* 2.5*   GFR: Estimated Creatinine Clearance: 54.3 mL/min (A) (by C-G formula based on SCr of 1.32 mg/dL (H)). Liver Function Tests: No results for input(s): AST, ALT, ALKPHOS, BILITOT, PROT, ALBUMIN in the last 168 hours. No results for input(s): LIPASE, AMYLASE in the last 168 hours. No results for input(s): AMMONIA in the last 168 hours. Coagulation Profile: No results for input(s): INR, PROTIME in the last 168 hours. Cardiac Enzymes: No results for input(s): CKTOTAL, CKMB, CKMBINDEX, TROPONINI in the last 168 hours. BNP (last 3 results) No results for input(s): PROBNP in the last 8760 hours. HbA1C: No results for input(s): HGBA1C in the last 72 hours. CBG: Recent Labs  Lab 01/24/20 1243 01/24/20 1642 01/24/20 2110 01/25/20 0734 01/25/20 1158  GLUCAP 125* 148* 160* 109* 293*   Lipid Profile: No results for input(s): CHOL, HDL, LDLCALC, TRIG, CHOLHDL, LDLDIRECT in the last 72  hours. Thyroid Function Tests: No results for input(s): TSH, T4TOTAL, FREET4, T3FREE, THYROIDAB in the last 72 hours. Anemia Panel: Recent Labs    01/23/20 1555  VITAMINB12 873   Sepsis Labs: No results for input(s): PROCALCITON, LATICACIDVEN in the last 168 hours.  Recent Results (from the past 240 hour(s))  MRSA PCR Screening     Status: None   Collection Time: 01/16/20  9:57 AM   Specimen: Nasal Mucosa; Nasopharyngeal  Result Value Ref Range Status   MRSA by PCR NEGATIVE NEGATIVE Final    Comment:        The GeneXpert MRSA Assay (FDA approved for NASAL specimens only), is one component of a comprehensive MRSA colonization surveillance program. It is not intended to diagnose MRSA infection nor to guide or monitor treatment for MRSA infections. Performed at [facility], [address] [place], [place] [address]   CULTURE, BLOOD (ROUTINE X 2) w Reflex to ID Panel     Status: None   Collection Time: 01/17/20  9:02 AM   Specimen: BLOOD  Result Value Ref Range Status   Specimen Description BLOOD RIGHT [facility]  Final   Special Requests   Final  BOTTLES DRAWN AEROBIC AND ANAEROBIC Blood Culture adequate volume   Culture   Final    NO GROWTH 5 DAYS Performed at [facility], [address] [place], [place] [address]    Report Status 01/22/2020 FINAL  Final  CULTURE, BLOOD (ROUTINE X 2) w Reflex to ID Panel     Status: None   Collection Time: 01/17/20  9:10 AM   Specimen: BLOOD  Result Value Ref Range Status   Specimen Description BLOOD LEFT [facility]  Final   Special Requests   Final    BOTTLES DRAWN AEROBIC AND ANAEROBIC Blood Culture adequate volume   Culture   Final    NO GROWTH 5 DAYS Performed at [facility], [address] [place], [place] [address]    Report Status 01/22/2020 FINAL  Final         Radiology Studies: EEG  Result Date: 01/24/2020 Lora Havens, MD     01/24/2020  1:46 PM Patient Name: Jason Montes MRN:  [id number] Epilepsy Attending: Lora Havens Referring Physician/Provider: Dr Lesleigh Noe Date: 01/24/2020 Duration: 22.15 minutes Patient history: 69 year old male with altered mental status.  EEG to evaluate for seizures. Level of alertness: Awake, asleep AEDs during EEG study: Gabapentin Technical aspects: This EEG study was done with scalp electrodes positioned according to the 10-20 International system of electrode placement. Electrical activity was acquired at a sampling rate of $Remov'500Hz'nolTaZ$  and reviewed with a high frequency filter of $RemoveB'70Hz'jEHldYXy$  and a low frequency filter of $RemoveB'1Hz'qpqMZXyQ$ . EEG data were recorded continuously and digitally stored. Description: The posterior dominant rhythm consists of 8 Hz activity of moderate voltage (25-35 uV) seen predominantly in posterior head regions, symmetric and reactive to eye opening and eye closing. Sleep was characterized by vertex waves, sleep spindles (12 to 14 Hz), maximal frontocentral region.  EEG showed continuous generalized 3 to 6 Hz theta-delta slowing. Hyperventilation and photic stimulation were not performed.   ABNORMALITY -Continuous slow, generalized IMPRESSION: This study is suggestive of mild to moderate diffuse encephalopathy, nonspecific etiology.  No seizures or epileptiform discharges were seen throughout the recording. Lora Havens   CT HEAD WO CONTRAST  Result Date: 01/23/2020 CLINICAL DATA:  69 year old male with complicated diabetes (recent foot amputation), atrial fibrillation on Eliquis, prior stroke. Neurologic deficit. EXAM: CT HEAD WITHOUT CONTRAST TECHNIQUE: Contiguous axial images were obtained from the base of the skull through the vertex without intravenous contrast. COMPARISON:  Head CT 01/11/2020. FINDINGS: Brain: Confluent chronic encephalomalacia in the posterior left temporal and occipital lobes again noted. Small chronic infarcts in the left cerebellum are stable. Stable gray-white matter differentiation throughout the brain. No  midline shift, ventriculomegaly, mass effect, evidence of mass lesion, intracranial hemorrhage or evidence of cortically based acute infarction. Vascular: Calcified atherosclerosis at the skull base. No suspicious intracranial vascular hyperdensity. Skull: Motion artifact at the skull base today. No acute osseous abnormality identified. Sinuses/Orbits: Visualized paranasal sinuses and mastoids are stable and well pneumatized. Other: No acute orbit or scalp soft tissue findings. IMPRESSION: 1. No acute intracranial abnormality. 2. Stable non contrast CT appearance of the brain with chronic left PCA and cerebellar infarcts. Electronically Signed   By: Genevie Ann M.D.   On: 01/23/2020 17:29        Scheduled Meds: . amiodarone  400 mg Oral BID  . apixaban  5 mg Oral BID  . vitamin C  500 mg Oral BID  . aspirin EC  81 mg Oral Daily  . atorvastatin  40  mg Oral QHS  . Chlorhexidine Gluconate Cloth  6 each Topical Daily  . cholecalciferol  1,000 Units Oral Daily  . clopidogrel  75 mg Oral Daily  . DULoxetine  20 mg Oral Daily  . famotidine  20 mg Oral Daily  . feeding supplement (ENSURE ENLIVE)  237 mL Oral TID BM  . fluticasone  2 spray Each Nare Daily  . gabapentin  200 mg Oral TID  . haloperidol  1 mg Oral BID  . insulin aspart  0-15 Units Subcutaneous TID WC  . insulin glargine  8 Units Subcutaneous Daily  . multivitamin with minerals  1 tablet Oral Daily  . OLANZapine zydis  12.5 mg Oral QHS  . polyethylene glycol  17 g Oral BID  . tamsulosin  0.4 mg Oral Daily  . [START ON 01/27/2020] thiamine injection  100 mg Intravenous Daily  . vitamin B-12  1,000 mcg Oral Daily   Continuous Infusions: . sodium chloride Stopped (01/15/20 0913)  . thiamine injection 500 mg (01/25/20 0516)     LOS: 13 days    Time spent: 27 minutes    Sharen Hones, MD [facility]   To contact the attending provider between 7A-7P or the covering provider during after hours 7P-7A, please log into the  web site [email or web address] and access using universal [place] password for that web site. If you do not have the password, please call the hospital operator.  01/25/2020, 2:31 PM

## 2020-01-25 NOTE — Progress Notes (Addendum)
Physical Therapy Treatment Patient Details Name: Jason Montes MRN: [id number] DOB: Dec 22, 1950 Today's Date: 01/25/2020    History of Present Illness Zyron Deeley is a 51yoM who comes [facility] 8/11 c AMS, overdose. Patientseen by podiatry as he was scheduled for procedure for partial left first ray amputation on 8/13.  Patient did not have cardiology clearance and needed it prior to surgery. Pt underwent Lt hallux amputation on 8/13, revascularization procedureon 8/16. PMH: HTN, HLD, DM, CVA, GERD, depression, RLS, PVD, CAD, CKD-3, AF on Eliquis, chronic Lt hallux osteomyelitis, sCHF with EF of 40-45%    PT Comments    Awake and alert today, ready to work.  Seems back to baseline for mentation compared to prior admission.   To EOB on L with rail and min guard.  Steady in sitting.  He is able to stand with min a x 2 and RW with less post pushing/lean.  While standing he c/o 8/10 dizziness.  Sat and requested to use bathroom.  Bedpan given given c/o dizziness with no results.  BP 113/71 P 85 in supine.  Sitting 107/88 P 85 standing 81/49 P 77 and requests to sit due to SOB.  Sats 91% on room air.  He reports 4/10 dizziness.  He is returned to supine with mod a x 1 and BP's increase 118/59 P 91 after repositioning in bed.  RN aware.  Pt does ask to walk despite c/o dizziness and discussion of BP's.  Education provided regarding safety with mobility with low BP's.  Addendum 1500 - discussed with discharge planner, Colletta [place].  Initial recommendation at PT eval was to return to group home as placement options are very limited for pt.  He has struggled with mobility and medical issues since admission and recovery has not been as anticipated.  Discharge home to boarding house is not a safe option at this time.  Will update recommendations to SNF tomorrow to allow for bed search.      Follow Up Recommendations  Supervision/Assistance - 24 hour;Other (comment);Supervision for mobility/OOB      Equipment Recommendations  None recommended by PT    Recommendations for Other Services       Precautions / Restrictions Precautions Required Braces or Orthoses: Other Brace Other Brace: postop shoe Restrictions Weight Bearing Restrictions: Yes RLE Weight Bearing: Weight bearing as tolerated LLE Weight Bearing: Weight bearing as tolerated LLE Partial Weight Bearing Percentage or Pounds: FWB heel only with post op shoe    Mobility  Bed Mobility Overal bed mobility: Needs Assistance       Supine to sit: Min guard     General bed mobility comments: used rail but overall much improved  Transfers Overall transfer level: Needs assistance   Transfers: Sit to/from Stand Sit to Stand: Min assist;+2 physical assistance         General transfer comment: +2 to stand initially but remains standing with min a x 1 for BP's  Ambulation/Gait             General Gait Details: deferred due to BP's   Stairs             Wheelchair Mobility    Modified Rankin (Stroke Patients Only)       Balance Overall balance assessment: Needs assistance   Sitting balance-Leahy Scale: Fair Sitting balance - Comments: does well sitting today   Standing balance support: Bilateral upper extremity supported;During functional activity Standing balance-Leahy Scale: Poor Standing balance comment: improved but stil requires min a  x 1 for balance                            Cognition Arousal/Alertness: Awake/alert Behavior During Therapy: WFL for tasks assessed/performed Overall Cognitive Status: Within Functional Limits for tasks assessed                                 General Comments: much clearer.  Seems generally back to baseline with mentation from prior admission      Exercises      General Comments        Pertinent Vitals/Pain Pain Assessment: No/denies pain    Home Living                      Prior Function             PT Goals (current goals can now be found in the care plan section) Progress towards PT goals: Progressing toward goals    Frequency    7X/week      PT Plan Current plan remains appropriate;Other (comment)    Co-evaluation              AM-PAC PT "6 Clicks" Mobility   Outcome Measure  Help needed turning from your back to your side while in a flat bed without using bedrails?: A Lot Help needed moving from lying on your back to sitting on the side of a flat bed without using bedrails?: A Lot Help needed moving to and from a bed to a chair (including a wheelchair)?: A Lot Help needed standing up from a chair using your arms (e.g., wheelchair or bedside chair)?: A Lot Help needed to walk in hospital room?: A Lot Help needed climbing 3-5 steps with a railing? : Total 6 Click Score: 11    End of Session Equipment Utilized During Treatment: Gait belt Activity Tolerance: Patient tolerated treatment well;Treatment limited secondary to medical complications (Comment) Patient left: in bed;with call bell/phone within reach;with bed alarm set Nurse Communication: Mobility status;Other (comment)       Time: 8502-7741 PT Time Calculation (min) (ACUTE ONLY): 25 min  Charges:  $Therapeutic Activity: 23-37 mins                    Chesley Noon, PTA 01/25/20, 9:32 AM

## 2020-01-25 NOTE — Progress Notes (Addendum)
Neurology Progress Note  Patient ID: Jason Montes is a 69 y.o. on whom we were consulted for AMS with PMHx of type 2 diabetes (A1c 7.9% on 01/17/2020), hypertension, hyperlipidemia, atrial fibrillation (on Eliquis and amiodarone, resumed after amputation on 8/13), B12 deficiency (no recent level, not currently being repleted), osteomyelitis of left great toe (s/p surgical amputation 8/13), prior stroke (left-sided predominantly), coronary artery disease (on asa and plavix, status post CABG), heart failure (EF 40 to 45%, prior reduced EF status post pacemaker), chronic kidney disease, depression, suicidal behavior  Major interval events:  - Patient did not get afternoon gabapentin 200 mg yesterday or morning haldol 1 mg (did get evening dose); per documentation refused the missed doses  - Nursing reports that the patient is floridly orthostatic, getting dizzy and having a significant blood pressure drop even with sitting  Subjective: -Is much more interactive this morning, reports he is concerned about his memory and word finding difficulties that he has had since his cardiac arrest and stroke respectively -He provides additional further social history including telling me he used to be in [employer], favored using LSD, cocaine, and large amounts of pot.  Denies tobacco as he reports he has an allergy of his "nose closing" if he uses nicotine.  Reports he avoids alcohol due to both of his parents being alcoholics. -No acute complaints  Exam: Vitals:   01/24/20 2043 01/25/20 0531  BP: (!) 99/58 (!) 96/58  Pulse: 81 78  Resp: 20 17  Temp: 97.9 F (36.6 C) 97.9 F (36.6 C)  SpO2: 93% 93%   Gen: In bed, NAD Resp: non-labored breathing, no acute distress Cardiac: Perfusing extremities well, irregularly irregular Extremities: bilateral lower extremities bandages in place as well as right hand Abd: soft, nt  Neuro: MS: Alert.  Continues to report the year as 2022, states it is August but  thought it was the fifth, not oriented to situation (reports he did "some bad things" and was incarcerated, but cannot state the medical details of his recent hospitalization or procedures).  Continues to have difficulty with visuospatial tasks, optic ataxia, oculomotor apraxia.   CN: Right-sided hemianopia.  Right eye esophoria, sluggish but reactive pupils bilaterally 4 to 2 mm, with some hippus bilaterally.  External ocular movements intact, face with right nasolabial fold flattening baseline per patient, sensation reduced on the right face in all three distributions, uvula elevates symmetrically, tongue midline Motor: Today minimal myoclonic movements, action triggered, still slightly worse on the right upper extremity than the left.  Tone without significant cogwheeling.  5 out of 5 strength in bilateral hip flexion, knee flexion, knee extension. Reflexes: 3+ biceps and brachioradialis in the right upper extremity, 2+ biceps brachioradialis in the left upper extremity.  2+ and symmetric patellar reflexes Sensory: Equal to light touch in all four extremities  Pertinent Labs: B12 873; MMA pending B1 in process RPR negative White blood cell count stable (10->11) Cr stable (1.3 on 8/25)  Personally reviewed EEG: 8/24  This study is suggestive of mild to moderate diffuse encephalopathy, nonspecific etiology.  No seizures or epileptiform discharges were seen throughout the recording.  Impression: Mr. Jason Montes appears to be improving with the changes in medication made.  His EEG is not consistent with nonconvulsive status epilepticus.  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  Diagnosis is mixed hypoactive/hyperactive delirium in the  setting of his osteomyelitis.  As this continues to improve, I will sign off at this time   Recommendations:  - Continue B12 daily  1000 mcg, PO if awake enough - Continue empiric thiamine 500 mcg q8hr IV for 3 days, then 100 daily - Appreciate primary team and psychiatry management of his psychiatric medications - Neurology will sign off at this time, please re-page with any new concerns  Lesleigh Noe MD-PhD [facility] [phone]    Addended for charge capture

## 2020-01-26 LAB — BASIC METABOLIC PANEL
Anion gap: 7 (ref 5–15)
BUN: 18 mg/dL (ref 8–23)
CO2: 24 mmol/L (ref 22–32)
Calcium: 8.6 mg/dL — ABNORMAL LOW (ref 8.9–10.3)
Chloride: 104 mmol/L (ref 98–111)
Creatinine, Ser: 1.42 mg/dL — ABNORMAL HIGH (ref 0.61–1.24)
GFR calc Af Amer: 59 mL/min — ABNORMAL LOW (ref >60)
GFR calc non Af Amer: 51 mL/min — ABNORMAL LOW (ref >60)
Glucose, Bld: 157 mg/dL — ABNORMAL HIGH (ref 70–99)
Potassium: 4.3 mmol/L (ref 3.5–5.1)
Sodium: 135 mmol/L (ref 135–145)

## 2020-01-26 LAB — METHYLMALONIC ACID, SERUM: Methylmalonic Acid, Quantitative: 227 nmol/L (ref 0–378)

## 2020-01-26 LAB — GLUCOSE, CAPILLARY
Glucose-Capillary: 150 mg/dL — ABNORMAL HIGH (ref 70–99)
Glucose-Capillary: 201 mg/dL — ABNORMAL HIGH (ref 70–99)
Glucose-Capillary: 246 mg/dL — ABNORMAL HIGH (ref 70–99)
Glucose-Capillary: 390 mg/dL — ABNORMAL HIGH (ref 70–99)

## 2020-01-26 LAB — CORTISOL: Cortisol, Plasma: 11.4 ug/dL

## 2020-01-26 LAB — CBC
HCT: 27.9 % — ABNORMAL LOW (ref 39.0–52.0)
Hemoglobin: 9.1 g/dL — ABNORMAL LOW (ref 13.0–17.0)
MCH: 28.4 pg (ref 26.0–34.0)
MCHC: 32.6 g/dL (ref 30.0–36.0)
MCV: 87.2 fL (ref 80.0–100.0)
Platelets: 199 10*3/uL (ref 150–400)
RBC: 3.2 MIL/uL — ABNORMAL LOW (ref 4.22–5.81)
RDW: 15.6 % — ABNORMAL HIGH (ref 11.5–15.5)
WBC: 9.9 10*3/uL (ref 4.0–10.5)
nRBC: 0 % (ref 0.0–0.2)

## 2020-01-26 LAB — MAGNESIUM: Magnesium: 2.6 mg/dL — ABNORMAL HIGH (ref 1.7–2.4)

## 2020-01-26 MED ORDER — INSULIN ASPART 100 UNIT/ML ~~LOC~~ SOLN
0.0000 [IU] | Freq: Three times a day (TID) | SUBCUTANEOUS | Status: DC
  Administered 2020-01-26: 15 [IU] via SUBCUTANEOUS
  Administered 2020-01-27: 11 [IU] via SUBCUTANEOUS
  Administered 2020-01-27: 5 [IU] via SUBCUTANEOUS
  Administered 2020-01-27: 8 [IU] via SUBCUTANEOUS
  Administered 2020-01-28: 5 [IU] via SUBCUTANEOUS
  Administered 2020-01-28: 3 [IU] via SUBCUTANEOUS
  Administered 2020-01-28: 5 [IU] via SUBCUTANEOUS
  Administered 2020-01-28 – 2020-01-29 (×2): 3 [IU] via SUBCUTANEOUS
  Administered 2020-01-29: 5 [IU] via SUBCUTANEOUS
  Administered 2020-01-29: 8 [IU] via SUBCUTANEOUS
  Administered 2020-01-30: 5 [IU] via SUBCUTANEOUS
  Administered 2020-01-30: 8 [IU] via SUBCUTANEOUS
  Administered 2020-01-30: 5 [IU] via SUBCUTANEOUS
  Administered 2020-01-30: 11 [IU] via SUBCUTANEOUS
  Administered 2020-01-30: 3 [IU] via SUBCUTANEOUS
  Administered 2020-01-31: 5 [IU] via SUBCUTANEOUS
  Administered 2020-01-31: 11 [IU] via SUBCUTANEOUS
  Administered 2020-01-31 – 2020-02-01 (×6): 5 [IU] via SUBCUTANEOUS
  Administered 2020-02-02: 3 [IU] via SUBCUTANEOUS
  Administered 2020-02-02: 5 [IU] via SUBCUTANEOUS
  Administered 2020-02-02 (×2): 8 [IU] via SUBCUTANEOUS
  Administered 2020-02-03: 3 [IU] via SUBCUTANEOUS
  Filled 2020-01-26 (×29): qty 1

## 2020-01-26 MED ORDER — AMIODARONE HCL 200 MG PO TABS
400.0000 mg | ORAL_TABLET | Freq: Every day | ORAL | Status: DC
  Administered 2020-01-27 – 2020-02-03 (×8): 400 mg via ORAL
  Filled 2020-01-26 (×8): qty 2

## 2020-01-26 NOTE — Progress Notes (Signed)
PROGRESS NOTE    Jason Montes  ACZ:[id number] DOB: [birth year] DOA: 01/11/2020 PCP: [facility], [facility]   Chief complaint apparent altered mental status.  Brief Narrative:  Jason Montes a 69 y.o.Caucasian malewith medical history significant ofhypertension, hyperlipidemia, diabetes mellitus, stroke, GERD, depression, RLS, PVD, CAD, CKD-3, atrial fibrillation on Eliquis, chronic left greater toe osteomyelitis,sCHF with EF of 40-45%, who presents with altered mental status and overdose ?unintentional.    Assessment & Plan:   Principal Problem:   Acute metabolic encephalopathy Active Problems:   Atrial fibrillation (HCC)   CAD (coronary artery disease)   GERD with esophagitis   Chronic systolic CHF (congestive heart failure) (HCC)   Hyperlipidemia   Essential hypertension   Acute renal failure superimposed on stage 3a chronic kidney disease ([place])   Protein-calorie malnutrition, severe (HCC)   Stage 3a chronic kidney disease   Osteomyelitis of great toe of left foot ([place])   Type II diabetes mellitus with renal manifestations ([place])   Stroke ([place])   Depression   Suicidal behavior   Pressure injury of skin  #1.  Acute metabolic encephalopathy, delirium, history of anoxic brain injury. Patient condition has improved.  Still on high-dose thiamine for another day.  2.  Drug overdose. Seen by psychiatry, Dr. Janese Banks has cleared the patient for discharge.  3.  Osteomyelitis of the left great toe.  Status post amputation. Antibiotics completed.  4.  Hypotension. Blood pressure has improved.  No evidence of adrenal insufficiency.  5.  Bilateral lower lobe aspiration pneumonia. Improved.  6.  Urinary retention. Continue Foley catheter, follow-up with urology as outpatient.  7.  Chronic systolic congestive heart failure with the pulmonary hypertension. Stable.  8.  Uncontrolled type 2 diabetes with hyperglycemia pain Continue Lantus.  9.  Severe protein  calorie malnutrition. Condition is improving, appetite improving.  10.  Paroxysmal atrial fibrillation with nonsustained ventricular tachycardia. Continue Eliquis and amiodarone.    DVT prophylaxis: Eliquis Code Status: Full Family Communication: None Disposition Plan:   Patient came from:                                                                                                                          Anticipated d/c place: ?   Barriers to d/c OR conditions which need to be met to effect a safe d/c:   Consultants:   Psychiatry and neurology.  Procedures:Foley Antimicrobials:None   Subjective: Patient condition much improved.  He no longer has any confusion.  He was able to walk with physical therapy.  Good appetite without nausea vomiting.  Denies any short of breath or cough.  Objective: Vitals:   01/26/20 0000 01/26/20 0500 01/26/20 0624 01/26/20 1234  BP: 98/61  (!) 100/44 113/63  Pulse: 78  78 78  Resp: 18  18   Temp: 97.7 F (36.5 C)  97.8 F (36.6 C) 97.7 F (36.5 C)  TempSrc: Oral  Oral Oral  SpO2: 92%  91% 93%  Weight:  75.6  kg    Height:        Intake/Output Summary (Last 24 hours) at 01/26/2020 1452 Last data filed at 01/26/2020 1300 Gross per 24 hour  Intake 290 ml  Output 1400 ml  Net -1110 ml   Filed Weights   01/23/20 0500 01/24/20 0300 01/26/20 0500  Weight: 77.5 kg 78.3 kg 75.6 kg    Examination:  General exam: Appears calm and comfortable  Respiratory system: Clear to auscultation. Respiratory effort normal. Cardiovascular system: S1 & S2 heard, RRR. No JVD, murmurs, rubs, gallops or clicks. No pedal edema. Gastrointestinal system: Abdomen is nondistended, soft and nontender. No organomegaly or masses felt. Normal bowel sounds heard. Central nervous system: Alert and oriented. No focal neurological deficits. Extremities: Symmetric 5 x 5 power. Skin: No rashes, lesions or ulcers Psychiatry: Judgement and insight appear  normal. Mood & affect appropriate.     Data Reviewed: I have personally reviewed following labs and imaging studies  CBC: Recent Labs  Lab 01/22/20 0628 01/23/20 0646 01/24/20 0450 01/25/20 0520 01/26/20 0616  WBC 12.3* 13.4* 10.5 11.0* 9.9  HGB 8.5* 9.1* 9.0* 8.8* 9.1*  HCT 24.6* 28.5* 27.7* 27.3* 27.9*  MCV 85.4 89.1 87.1 87.5 87.2  PLT 139* 182 174 214 323   Basic Metabolic Panel: Recent Labs  Lab 01/22/20 0628 01/23/20 0425 01/24/20 0450 01/25/20 0520 01/26/20 0616  NA 136 135 136 138 135  K 3.3* 3.9 3.8 4.1 4.3  CL 107 109 106 107 104  CO2 19* 19* $Remov'22 24 24  'BgBxol$ GLUCOSE 107* 69* 104* 119* 157*  BUN 24* $Remov'16 15 16 18  'LTuMsj$ CREATININE 1.38* 1.16 1.17 1.32* 1.42*  CALCIUM 8.5* 8.3* 8.3* 8.2* 8.6*  MG 2.2 2.5* 2.6* 2.5* 2.6*   GFR: Estimated Creatinine Clearance: 50.5 mL/min (A) (by C-G formula based on SCr of 1.42 mg/dL (H)). Liver Function Tests: No results for input(s): AST, ALT, ALKPHOS, BILITOT, PROT, ALBUMIN in the last 168 hours. No results for input(s): LIPASE, AMYLASE in the last 168 hours. No results for input(s): AMMONIA in the last 168 hours. Coagulation Profile: No results for input(s): INR, PROTIME in the last 168 hours. Cardiac Enzymes: No results for input(s): CKTOTAL, CKMB, CKMBINDEX, TROPONINI in the last 168 hours. BNP (last 3 results) No results for input(s): PROBNP in the last 8760 hours. HbA1C: No results for input(s): HGBA1C in the last 72 hours. CBG: Recent Labs  Lab 01/25/20 1158 01/25/20 1650 01/25/20 2244 01/26/20 0739 01/26/20 1147  GLUCAP 293* 229* 321* 150* 201*   Lipid Profile: No results for input(s): CHOL, HDL, LDLCALC, TRIG, CHOLHDL, LDLDIRECT in the last 72 hours. Thyroid Function Tests: No results for input(s): TSH, T4TOTAL, FREET4, T3FREE, THYROIDAB in the last 72 hours. Anemia Panel: Recent Labs    01/23/20 1555  VITAMINB12 873   Sepsis Labs: No results for input(s): PROCALCITON, LATICACIDVEN in the last 168  hours.  Recent Results (from the past 240 hour(s))  CULTURE, BLOOD (ROUTINE X 2) w Reflex to ID Panel     Status: None   Collection Time: 01/17/20  9:02 AM   Specimen: BLOOD  Result Value Ref Range Status   Specimen Description BLOOD RIGHT [facility]  Final   Special Requests   Final    BOTTLES DRAWN AEROBIC AND ANAEROBIC Blood Culture adequate volume   Culture   Final    NO GROWTH 5 DAYS Performed at [facility], [address], [place], [place] [address]    Report Status 01/22/2020 FINAL  Final  CULTURE, BLOOD (ROUTINE X 2) w Reflex to ID Panel     Status: None   Collection Time: 01/17/20  9:10 AM   Specimen: BLOOD  Result Value Ref Range Status   Specimen Description BLOOD LEFT [facility]  Final   Special Requests   Final    BOTTLES DRAWN AEROBIC AND ANAEROBIC Blood Culture adequate volume   Culture   Final    NO GROWTH 5 DAYS Performed at [facility], [address], [place], [place] [address]    Report Status 01/22/2020 FINAL  Final         Radiology Studies: No results found.      Scheduled Meds: . amiodarone  400 mg Oral BID  . apixaban  5 mg Oral BID  . vitamin C  500 mg Oral BID  . aspirin EC  81 mg Oral Daily  . atorvastatin  40 mg Oral QHS  . Chlorhexidine Gluconate Cloth  6 each Topical Daily  . cholecalciferol  1,000 Units Oral Daily  . clopidogrel  75 mg Oral Daily  . DULoxetine  20 mg Oral Daily  . famotidine  20 mg Oral Daily  . feeding supplement (ENSURE ENLIVE)  237 mL Oral TID BM  . fluticasone  2 spray Each Nare Daily  . gabapentin  200 mg Oral TID  . haloperidol  1 mg Oral BID  . insulin aspart  0-15 Units Subcutaneous TID WC  . insulin glargine  8 Units Subcutaneous Daily  . multivitamin with minerals  1 tablet Oral Daily  . OLANZapine zydis  12.5 mg Oral QHS  . polyethylene glycol  17 g Oral BID  . tamsulosin  0.4 mg Oral Daily  . vitamin B-12  1,000 mcg Oral Daily   Continuous Infusions: . sodium chloride Stopped (01/15/20  0913)  . thiamine injection 500 mg (01/26/20 0513)     LOS: 14 days    Time spent: 28 minutes    Sharen Hones, MD [facility]   To contact the attending provider between 7A-7P or the covering provider during after hours 7P-7A, please log into the web site [email or web address] and access using universal [place] password for that web site. If you do not have the password, please call the hospital operator.  01/26/2020, 2:52 PM

## 2020-01-26 NOTE — Progress Notes (Signed)
SUBJECTIVE: doing better  Vitals:   01/26/20 0000 01/26/20 0500 01/26/20 0624 01/26/20 1234  BP: 98/61  (!) 100/44 113/63  Pulse: 78  78 78  Resp: 18  18   Temp: 97.7 F (36.5 C)  97.8 F (36.6 C) 97.7 F (36.5 C)  TempSrc: Oral  Oral Oral  SpO2: 92%  91% 93%  Weight:  75.6 kg    Height:        Intake/Output Summary (Last 24 hours) at 01/26/2020 1506 Last data filed at 01/26/2020 1300 Gross per 24 hour  Intake 290 ml  Output 1400 ml  Net -1110 ml    LABS: Basic Metabolic Panel: Recent Labs    01/25/20 0520 01/26/20 0616  NA 138 135  K 4.1 4.3  CL 107 104  CO2 24 24  GLUCOSE 119* 157*  BUN 16 18  CREATININE 1.32* 1.42*  CALCIUM 8.2* 8.6*  MG 2.5* 2.6*   Liver Function Tests: No results for input(s): AST, ALT, ALKPHOS, BILITOT, PROT, ALBUMIN in the last 72 hours. No results for input(s): LIPASE, AMYLASE in the last 72 hours. CBC: Recent Labs    01/25/20 0520 01/26/20 0616  WBC 11.0* 9.9  HGB 8.8* 9.1*  HCT 27.3* 27.9*  MCV 87.5 87.2  PLT 214 199   Cardiac Enzymes: No results for input(s): CKTOTAL, CKMB, CKMBINDEX, TROPONINI in the last 72 hours. BNP: Invalid input(s): POCBNP D-Dimer: No results for input(s): DDIMER in the last 72 hours. Hemoglobin A1C: No results for input(s): HGBA1C in the last 72 hours. Fasting Lipid Panel: No results for input(s): CHOL, HDL, LDLCALC, TRIG, CHOLHDL, LDLDIRECT in the last 72 hours. Thyroid Function Tests: No results for input(s): TSH, T4TOTAL, T3FREE, THYROIDAB in the last 72 hours.  Invalid input(s): FREET3 Anemia Panel: Recent Labs    01/23/20 1555  VITAMINB12 873     PHYSICAL EXAM General: Well developed, well nourished, in no acute distress HEENT:  Normocephalic and atramatic Neck:  No JVD.  Lungs: Clear bilaterally to auscultation and percussion. Heart: HRRR . Normal S1 and S2 without gallops or murmurs.  Abdomen: Bowel sounds are positive, abdomen soft and non-tender  Msk:  Back normal, normal  gait. Normal strength and tone for age. Extremities: No clubbing, cyanosis or edema.   Neuro: Alert and oriented X 3. Psych:  Good affect, responds appropriately  TELEMETRY:   ASSESSMENT AND PLAN: V. Tachycardia.m will decrease amio to 400 daily.  Principal Problem:   Acute metabolic encephalopathy Active Problems:   Atrial fibrillation (HCC)   CAD (coronary artery disease)   GERD with esophagitis   Chronic systolic CHF (congestive heart failure) (HCC)   Hyperlipidemia   Essential hypertension   Acute renal failure superimposed on stage 3a chronic kidney disease (HCC)   Protein-calorie malnutrition, severe (HCC)   Stage 3a chronic kidney disease   Osteomyelitis of great toe of left foot (HCC)   Type II diabetes mellitus with renal manifestations ([place])   Stroke ([place])   Depression   Suicidal behavior   Pressure injury of skin    Adri Schloss A, MD, [facility] 01/26/2020 3:06 PM

## 2020-01-26 NOTE — NC FL2 (Signed)
[place] LEVEL OF CARE SCREENING TOOL     IDENTIFICATION  Patient Name: Jason Montes Birthdate: 03-07-1951 Sex: male Admission Date (Current Location): 01/11/2020  [facility] and [place] Number:  Engineering geologist and Address:         Provider Number: [id number]  Attending Physician Name and Address:  Sharen Hones, MD  Relative Name and Phone Number:       Current Level of Care: Hospital Recommended Level of Care: [place] Prior Approval Number:    Date Approved/Denied:   PASRR Number:    Discharge Plan: SNF    Current Diagnoses: Patient Active Problem List   Diagnosis Date Noted   Pressure injury of skin 82/95/6213   Acute metabolic encephalopathy 08/65/7846   Osteomyelitis of great toe of left foot ([place]) 01/11/2020   Type II diabetes mellitus with renal manifestations ([place]) 01/11/2020   Stroke ([place]) 01/11/2020   Depression 01/11/2020   Suicidal behavior 01/11/2020   Subacute osteomyelitis of left foot ([place])    Intertrochanteric fracture of right femur, closed, initial encounter ([place]) 11/15/2019   Accidental fall 11/15/2019   Preoperative clearance 11/15/2019   Closed right hip fracture, initial encounter ([place]) 11/15/2019   Diabetic foot infection ([place])    Stage 3a chronic kidney disease    Cellulitis of left foot 10/07/2019   Protein-calorie malnutrition, severe ([place]) 03/13/2019   Acute renal failure superimposed on stage 3a chronic kidney disease ([place]) 03/11/2019   Family history of malignant neoplasm of gastrointestinal tract    Benign neoplasm of transverse colon    Benign neoplasm of cecum    Benign neoplasm of ascending colon    Benign neoplasm of descending colon    Polyp of sigmoid colon    Atrial fibrillation ([place]) 02/25/2018   Vitamin B12 deficiency anemia 02/25/2018   CKD (chronic kidney disease) stage 2, GFR 60-89 ml/min 02/25/2018   CAD (coronary artery disease) 02/25/2018   DM  (diabetes mellitus), type 2, uncontrolled with complications ([place]) 96/29/5284   GERD with esophagitis 02/25/2018   Generalized muscle weakness 13/24/4010   Chronic systolic CHF (congestive heart failure) (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[place]) 02/25/2018   Hyperlipidemia 02/25/2018   Essential hypertension 02/25/2018   Hypokalemia 02/25/2018   Mild neurocognitive disorder 02/25/2018   Osteoarthritis 02/25/2018   PVD (peripheral vascular disease) ([place]) 02/25/2018   Presbyopia 02/25/2018   Restless leg syndrome 02/25/2018   History of stroke with current residual effects 02/25/2018   Subacute osteomyelitis of right foot ([place]) 02/25/2018   Tinea unguium 02/25/2018    Orientation RESPIRATION BLADDER Height & Weight     Self  Normal Incontinent, Indwelling catheter Weight: 75.6 kg Height:  5\' 9"  (175.3 cm)  BEHAVIORAL SYMPTOMS/MOOD NEUROLOGICAL BOWEL NUTRITION STATUS      Incontinent Diet (finger foods)  AMBULATORY STATUS COMMUNICATION OF NEEDS Skin   Extensive Assist Verbally PU Stage and Appropriate Care, Surgical wounds                       Personal Care Assistance Level of Assistance              Functional Limitations Info             SPECIAL CARE FACTORS FREQUENCY  PT (By licensed PT), OT (By licensed OT)                    Contractures Contractures Info: Not present  Additional Factors Info  Code Status, Allergies Code Status Info: DNR Allergies Info: Amlodipine, Norvasc, Penicillins           Current Medications (01/26/2020):  This is the current hospital active medication list Current Facility-Administered Medications  Medication Dose Route Frequency Provider Last Rate Last Admin   0.9 %  sodium chloride infusion   Intravenous PRN Algernon Huxley, MD   Paused at 01/15/20 0913   acetaminophen (TYLENOL) tablet 650 mg  650 mg Oral Q6H PRN Algernon Huxley, MD   650 mg at 01/18/20 1211    Or   acetaminophen (TYLENOL) suppository 650 mg  650 mg Rectal Q6H PRN Algernon Huxley, MD       amiodarone (PACERONE) tablet 400 mg  400 mg Oral BID Callwood, Dwayne D, MD   400 mg at 01/25/20 2235   apixaban (ELIQUIS) tablet 5 mg  5 mg Oral BID Dallie Piles, RPH   5 mg at 01/25/20 2235   ascorbic acid (VITAMIN C) tablet 500 mg  500 mg Oral BID Enzo Bi, MD   500 mg at 01/25/20 2237   aspirin EC tablet 81 mg  81 mg Oral Daily Algernon Huxley, MD   81 mg at 01/25/20 0865   atorvastatin (LIPITOR) tablet 40 mg  40 mg Oral QHS Algernon Huxley, MD   40 mg at 01/25/20 2236   Chlorhexidine Gluconate Cloth 2 % PADS 6 each  6 each Topical Daily Enzo Bi, MD   6 each at 01/25/20 1000   cholecalciferol (VITAMIN D3) tablet 1,000 Units  1,000 Units Oral Daily Algernon Huxley, MD   1,000 Units at 01/25/20 0841   clopidogrel (PLAVIX) tablet 75 mg  75 mg Oral Daily Algernon Huxley, MD   75 mg at 01/25/20 0841   DULoxetine (CYMBALTA) DR capsule 20 mg  20 mg Oral Daily Eulas Post, MD   20 mg at 01/25/20 0842   famotidine (PEPCID) tablet 20 mg  20 mg Oral Daily Algernon Huxley, MD   20 mg at 01/25/20 0841   feeding supplement (ENSURE ENLIVE) (ENSURE ENLIVE) liquid 237 mL  237 mL Oral TID BM Enzo Bi, MD   237 mL at 01/25/20 1500   fluticasone (FLONASE) 50 MCG/ACT nasal spray 2 spray  2 spray Each Nare Daily Algernon Huxley, MD   2 spray at 01/25/20 7846   gabapentin (NEURONTIN) capsule 200 mg  200 mg Oral TID Eulas Post, MD   200 mg at 01/25/20 2237   haloperidol (HALDOL) tablet 1 mg  1 mg Oral BID Eulas Post, MD   1 mg at 01/25/20 2237   hydrALAZINE (APRESOLINE) injection 5 mg  5 mg Intravenous Q2H PRN Algernon Huxley, MD       insulin aspart (novoLOG) injection 0-15 Units  0-15 Units Subcutaneous TID WC Enzo Bi, MD   5 Units at 01/25/20 1738   insulin glargine (LANTUS) injection 8 Units  8 Units Subcutaneous Daily Enzo Bi, MD   8 Units at 01/25/20 9629   multivitamin with minerals tablet  1 tablet  1 tablet Oral Daily Enzo Bi, MD   1 tablet at 01/25/20 0841   nitroGLYCERIN (NITROSTAT) SL tablet 0.4 mg  0.4 mg Sublingual Q5 min PRN Algernon Huxley, MD       OLANZapine zydis (ZYPREXA) disintegrating tablet 12.5 mg  12.5 mg Oral QHS Eulas Post, MD   12.5 mg at 01/25/20 2237   ondansetron [facility]) injection 4 mg  4 mg Intravenous Q8H PRN Algernon Huxley, MD       polyethylene glycol (MIRALAX / GLYCOLAX) packet 17 g  17 g Oral BID Enzo Bi, MD   17 g at 01/25/20 2245   tamsulosin (FLOMAX) capsule 0.4 mg  0.4 mg Oral Daily Algernon Huxley, MD   0.4 mg at 01/25/20 0841   thiamine 500mg  in normal saline (40ml) IVPB  500 mg Intravenous Redmond Pulling, MD 100 mL/hr at 01/26/20 0513 500 mg at 01/26/20 0513   vitamin B-12 (CYANOCOBALAMIN) tablet 1,000 mcg  1,000 mcg Oral Daily Enzo Bi, MD   1,000 mcg at 01/25/20 0223     Discharge Medications: Please see discharge summary for a list of discharge medications.  Relevant Imaging Results:  Relevant Lab Results:   Additional Information ss [id number]  Beverly Sessions, RN

## 2020-01-26 NOTE — Progress Notes (Signed)
Physical Therapy Treatment Patient Details Name: Jason Montes MRN: [id number] DOB: [birth year] Today's Date: 01/26/2020    History of Present Illness Jason Montes is a 77yoM who comes to [facility] 8/11 c AMS, overdose. Patientseen by podiatry as he was scheduled for procedure for partial left first ray amputation on 8/13.  Patient did not have cardiology clearance and needed it prior to surgery. Pt underwent Lt hallux amputation on 8/13, revascularization procedureon 8/16. PMH: HTN, HLD, DM, CVA, GERD, depression, RLS, PVD, CAD, CKD-3, AF on Eliquis, chronic Lt hallux osteomyelitis, sCHF with EF of 40-45%    PT Comments    Pt was asleep in supine upon arriving. He easily awakes and is cooperative throughout. When first asked orientation questions, pt was disoriented to all but self. Orientation improved during session and by end of session pt was describing why he was in hospital. Pt was motivated to get OOB and improve abilities. Resting vitals in supine: BP 110/57 HR 78 sao2 95%, sitting EOB BP 110/57 HR 84, standing BP 116/61 HR 83. Pt does not have any symptoms of orthostatic hypotension this session. He was able to stand 4 x during session with focus on improve standing balance.Post op Darco shoe donned to LLE and personal boater shoe on RLE. Max assist to stand pivot to recliner for EOB. Pt present with posterior lean/push in sitting but much more severe ion standing. RN aware. Therapist educated pt on importance of performing there ex throughout the day in bed and in recliner. He was able to perform exercises independently once in recliner with therapist demonstrating proper performance prior to pt performing. Overall pt is progressing but still present with deficits that makes him unsafe to DC home without assistance. Therapist recommends pt DC to SNF/ have 24 hour assistance or supervision for safety. Acute PT will continue to follow per POC.      Follow Up Recommendations   Supervision/Assistance - 24 hour;Other (comment);Supervision for mobility/OOB;SNF     Equipment Recommendations  None recommended by PT    Recommendations for Other Services       Precautions / Restrictions Precautions Precautions: Fall Precaution Comments: Suicide precautions Required Braces or Orthoses: Other Brace Other Brace: postop shoe Restrictions Weight Bearing Restrictions: Yes RLE Weight Bearing: Weight bearing as tolerated LLE Weight Bearing: Weight bearing as tolerated    Mobility  Bed Mobility Overal bed mobility: Needs Assistance Bed Mobility: Supine to Sit     Supine to sit: Min assist     General bed mobility comments: Min assist to progress from supine to short sit EOB. no c/o dizziness or lightheadedness  Transfers Overall transfer level: Needs assistance Equipment used: Rolling walker (2 wheeled) Transfers: Sit to/from [facility] Sit to Stand: Max assist;Mod assist Stand pivot transfers: Max assist;From elevated surface       General transfer comment: Max assist to transfer from EOB to recliner. gait belt used for safety. did not use RW to stand pivot but did use for STS trials  Ambulation/Gait             General Gait Details: unable/unsafe 2/2 to posterior push   Stairs             Wheelchair Mobility    Modified Rankin (Stroke Patients Only)       Balance Overall balance assessment: Needs assistance Sitting-balance support: Bilateral upper extremity supported;Feet supported Sitting balance-Leahy Scale: Poor Sitting balance - Comments: pt has posterior lean/LOB in sitting    Standing balance support:  Bilateral upper extremity supported;During functional activity Standing balance-Leahy Scale: Poor Standing balance comment: severe posterior push upon first several standing trials. did progress standing balance throughout session but pt continues to be high fall risk                             Cognition Arousal/Alertness: Awake/alert (asleep upon entry but awake and alert during) Behavior During Therapy: WFL for tasks assessed/performed Overall Cognitive Status: Within Functional Limits for tasks assessed Area of Impairment: Orientation;Memory;Awareness;Safety/judgement;Problem solving                 Orientation Level: Disoriented to;Place;Time;Situation   Memory: Decreased recall of precautions;Decreased short-term memory   Safety/Judgement: Decreased awareness of safety;Decreased awareness of deficits Awareness: Intellectual Problem Solving: Slow processing;Decreased initiation;Requires verbal cues;Requires tactile cues;Difficulty sequencing General Comments: Pt was asleep upon entry but easily awakes and agrees to PT session. He is disoriented but able to follow commands consistently      Exercises      General Comments        Pertinent Vitals/Pain Pain Assessment: No/denies pain    Home Living                      Prior Function            PT Goals (current goals can now be found in the care plan section) Acute Rehab PT Goals Patient Stated Goal: none stated Progress towards PT goals: Progressing toward goals    Frequency    7X/week      PT Plan Current plan remains appropriate;Other (comment)    Co-evaluation              AM-PAC PT "6 Clicks" Mobility   Outcome Measure  Help needed turning from your back to your side while in a flat bed without using bedrails?: A Lot Help needed moving from lying on your back to sitting on the side of a flat bed without using bedrails?: A Lot Help needed moving to and from a bed to a chair (including a wheelchair)?: A Lot Help needed standing up from a chair using your arms (e.g., wheelchair or bedside chair)?: A Lot Help needed to walk in hospital room?: Total Help needed climbing 3-5 steps with a railing? : Total 6 Click Score: 10    End of Session Equipment Utilized During  Treatment: Gait belt Activity Tolerance: Patient tolerated treatment well Patient left: in chair;with call bell/phone within reach;with chair alarm set;with nursing/sitter in room Nurse Communication: Mobility status PT Visit Diagnosis: Difficulty in walking, not elsewhere classified (R26.2);Unsteadiness on feet (R26.81);Other abnormalities of gait and mobility (R26.89);Muscle weakness (generalized) (M62.81);Other symptoms and signs involving the nervous system (R29.898)     Time: 1655-3748 PT Time Calculation (min) (ACUTE ONLY): 18 min  Charges:  $Therapeutic Activity: 8-22 mins                     [name] Fusi PTA 01/26/20, 3:12 PM

## 2020-01-26 NOTE — TOC Progression Note (Addendum)
Transition of Care [facility]) - Progression Note    Patient Details  Name: Jason Montes MRN: [id number] Date of Birth: [birth year]  Transition of Care [facility]) CM/SW Contact  Beverly Sessions, RN Phone Number: 01/26/2020, 1:12 PM  Clinical Narrative:    PT to see patient again today fl2 sent for signature Bed search initiated Voicemail left for daughter to discuss discharge disposition  NCMUST currently down and unable to check PASRR    Received return call from daughter.  She is in agreement for SNF placement.  Would prefer for placement to be local, but does not have a preference on facility        Expected Discharge Plan and Services                                                 Social Determinants of Health (SDOH) Interventions    Readmission Risk Interventions Readmission Risk Prevention Plan 01/17/2020  Medication Review ([place]) Complete  HRI or Home Care Consult Complete  Palliative Care Screening Not Applicable  Some recent data might be hidden

## 2020-01-26 NOTE — Final Progress Note (Signed)
Physician Final Progress Note  Patient ID: Jason Montes MRN: [id number] DOB/AGE: [birth year] 69 y.o.  Admit date: 01/11/2020 Admitting provider: Fritzi Mandes, MD Discharge date: 01/26/2020   Admission Diagnoses: Major depression severe recurrent  Dysthymia, generalized anxiety   Family discord     Discharge Diagnoses:  Principal Problem:   Acute metabolic encephalopathy Active Problems:   Atrial fibrillation (HCC)   CAD (coronary artery disease)   GERD with esophagitis   Chronic systolic CHF (congestive heart failure) (HCC)   Hyperlipidemia   Essential hypertension   Acute renal failure superimposed on stage 3a chronic kidney disease (HCC)   Protein-calorie malnutrition, severe (HCC)   Stage 3a chronic kidney disease   Osteomyelitis of great toe of left foot (HCC)   Type II diabetes mellitus with renal manifestations (HCC)   Stroke ([place])   Depression   Suicidal behavior   Pressure injury of skin   Same   Consults:  Psych MD et al   Significant Findings/ Diagnostic Studies:  General MS issues in psych  Procedures:  Consult rounds  Discharge Condition: fair to okay   Disposition:  Possible NH   Diet: as tolerated   Discharge Activity: {as tolerated    Patient initially seen for various factors affecting his Mental status but at baseline has major depression and family discord He has cleared his MS as much as possible given his series of medical problems   He contracts for safety and does not need a sitter at this time and can be cleared from Psych point of view  Continues on same Psych meds as ordered    Assets  --recovered as much as he can ADL's may need assistance Akathisia none Cognition improving Recall improving  handednes not known Psychomotor activity calmer  Sleep improved    Oriented times three Consciousness not clouded or fluctuant Mood improving affect better Eye contact and rapport better Less daytime sedation Thought process and  content --no active SI  More logical and organized no frank psychosis or mania per se Judgement insight reliability limited Si and HI none reported Contracts for safety  Movements no shakes tics tremors  Abstraction poor  Concentration and attention fair but improving somewhat  More alert in general  Speech more normal rate volume in general             Follow-up Information    Kris Hartmann, NP Follow up in 1 month(s).   Specialty: Vascular Surgery Why: Seen as consult. Will need ABI with visit.  Contact information: [place] [address] [phone]        Caroline More, DPM. Schedule an appointment as soon as possible for a visit in 1 week(s).   Specialty: Podiatry Contact information: [place] [address] [phone]               Total time spent taking care of this patient: 25-30  Minutes   Sitter d/ced , same psych meds awaits  Placement in general   Signed: Eulas Post 01/26/2020, 1:17 PM

## 2020-01-27 LAB — VITAMIN B1: Vitamin B1 (Thiamine): 81.9 nmol/L (ref 66.5–200.0)

## 2020-01-27 LAB — BASIC METABOLIC PANEL
Anion gap: 6 (ref 5–15)
BUN: 21 mg/dL (ref 8–23)
CO2: 26 mmol/L (ref 22–32)
Calcium: 8.6 mg/dL — ABNORMAL LOW (ref 8.9–10.3)
Chloride: 104 mmol/L (ref 98–111)
Creatinine, Ser: 1.28 mg/dL — ABNORMAL HIGH (ref 0.61–1.24)
GFR calc Af Amer: >60 mL/min (ref >60)
GFR calc non Af Amer: 58 mL/min — ABNORMAL LOW (ref >60)
Glucose, Bld: 97 mg/dL (ref 70–99)
Potassium: 4.6 mmol/L (ref 3.5–5.1)
Sodium: 136 mmol/L (ref 135–145)

## 2020-01-27 LAB — CBC
HCT: 27.7 % — ABNORMAL LOW (ref 39.0–52.0)
Hemoglobin: 9.2 g/dL — ABNORMAL LOW (ref 13.0–17.0)
MCH: 28.9 pg (ref 26.0–34.0)
MCHC: 33.2 g/dL (ref 30.0–36.0)
MCV: 87.1 fL (ref 80.0–100.0)
Platelets: 196 10*3/uL (ref 150–400)
RBC: 3.18 MIL/uL — ABNORMAL LOW (ref 4.22–5.81)
RDW: 15.9 % — ABNORMAL HIGH (ref 11.5–15.5)
WBC: 8.8 10*3/uL (ref 4.0–10.5)
nRBC: 0 % (ref 0.0–0.2)

## 2020-01-27 LAB — GLUCOSE, CAPILLARY
Glucose-Capillary: 107 mg/dL — ABNORMAL HIGH (ref 70–99)
Glucose-Capillary: 300 mg/dL — ABNORMAL HIGH (ref 70–99)
Glucose-Capillary: 218 mg/dL — ABNORMAL HIGH (ref 70–99)
Glucose-Capillary: 305 mg/dL — ABNORMAL HIGH (ref 70–99)

## 2020-01-27 LAB — MAGNESIUM: Magnesium: 2.4 mg/dL (ref 1.7–2.4)

## 2020-01-27 LAB — SARS CORONAVIRUS 2 BY RT PCR (HOSPITAL ORDER, PERFORMED IN ~~LOC~~ HOSPITAL LAB): SARS Coronavirus 2: NEGATIVE

## 2020-01-27 MED ORDER — THIAMINE HCL 100 MG PO TABS: 100.0000 mg | ORAL_TABLET | Freq: Every day | ORAL | 0 refills | Status: DC

## 2020-01-27 MED ORDER — DULOXETINE HCL 20 MG PO CPEP
20.0000 mg | ORAL_CAPSULE | Freq: Every day | ORAL | 0 refills | Status: DC
Start: 2020-01-28 — End: 2020-02-03

## 2020-01-27 MED ORDER — HALOPERIDOL 1 MG PO TABS: 1.0000 mg | ORAL_TABLET | Freq: Two times a day (BID) | ORAL | 0 refills | Status: DC

## 2020-01-27 MED ORDER — DRONABINOL 2.5 MG PO CAPS: 2.5000 mg | ORAL_CAPSULE | Freq: Two times a day (BID) | ORAL | 0 refills | Status: DC

## 2020-01-27 MED ORDER — OLANZAPINE 15 MG PO TBDP
15.0000 mg | ORAL_TABLET | Freq: Every day | ORAL | 0 refills | Status: DC
Start: 2020-01-27 — End: 2020-02-03

## 2020-01-27 MED ORDER — AMIODARONE HCL 400 MG PO TABS
200.0000 mg | ORAL_TABLET | Freq: Every day | ORAL | 0 refills | Status: DC
Start: 2020-01-28 — End: 2020-02-03

## 2020-01-27 NOTE — TOC Progression Note (Signed)
Transition of Care [facility]) - Progression Note    Patient Details  Name: Jason Montes MRN: [id number] Date of Birth: 17-Jul-1950  Transition of Care [facility]) CM/SW Contact  Beverly Sessions, RN Phone Number: 01/27/2020, 4:17 PM  Clinical Narrative:    Case discussed with [facility] leadership Order placed for "consult rehab MD ip rehab"  Dr Adam Phenix notified and she is to review      Expected Discharge Plan and Services           Expected Discharge Date: 01/27/20                                     Social Determinants of Health (SDOH) Interventions    Readmission Risk Interventions Readmission Risk Prevention Plan 01/17/2020  Medication Review ([place]) Complete  HRI or Home Care Consult Complete  Palliative Care Screening Not Applicable  Some recent data might be hidden

## 2020-01-27 NOTE — TOC Progression Note (Signed)
Transition of Care [facility]) - Progression Note    Patient Details  Name: Jason Montes MRN: [id number] Date of Birth: 08-Jun-1950  Transition of Care [facility]) CM/SW Contact  Beverly Sessions, RN Phone Number: 01/27/2020, 2:47 PM  Clinical Narrative:      Patient has been cleared by psych Patient has been sitter free for >24 hours Repeat covid test negative  As of this morning there were for offers for SNF.  All of those offers have since been rescinded  TOC has reached out to [facility] care through [place].  She tried to put me in contact with the social worker, however the call was dropped and I am unable to get back through at this time.  TOC leadership updated         Expected Discharge Plan and Services           Expected Discharge Date: 01/27/20                                     Social Determinants of Health (SDOH) Interventions    Readmission Risk Interventions Readmission Risk Prevention Plan 01/17/2020  Medication Review (RN Care Manager) Complete  HRI or Home Care Consult Complete  Palliative Care Screening Not Applicable  Some recent data might be hidden

## 2020-01-27 NOTE — Progress Notes (Signed)
Inpatient Diabetes Program Recommendations  AACE/ADA: New Consensus Statement on Inpatient Glycemic Control (2015)  Target Ranges:  Prepandial:   less than 140 mg/dL      Peak postprandial:   less than 180 mg/dL (1-2 hours)      Critically ill patients:  140 - 180 mg/dL   Lab Results  Component Value Date   GLUCAP 300 (H) 01/27/2020   HGBA1C 7.9 (H) 01/17/2020    Review of Glycemic Control Results for Jason Montes, Jason Montes (MRN [id number]) as of 01/27/2020 14:01  Ref. Range 01/26/2020 11:47 01/26/2020 17:02 01/26/2020 21:10 01/27/2020 07:56 01/27/2020 12:03  Glucose-Capillary Latest Ref Range: 70 - 99 mg/dL 201 (H) 246 (H) 390 (H) 107 (H) 300 (H)    Inpatient Diabetes Program Recommendations:   Noted postprandial CBGs elevated. Please consider: -Add Novolog 3 units tid meal coverage if eats 50%  Thank you, Bethena Roys E. Ellington Cornia, RN, MSN, CDE  Diabetes Coordinator Inpatient Glycemic Control Team Team Pager [phone] (8am-5pm) 01/27/2020 2:02 PM

## 2020-01-27 NOTE — TOC Progression Note (Signed)
Transition of Care [facility]) - Progression Note    Patient Details  Name: Jason Montes MRN: [id number] Date of Birth: 1950/07/24  Transition of Care [facility]) CM/SW [place], [place] Phone Number: [phone] 01/27/2020, 4:57 PM  Clinical Narrative:     CSW sent fax to [facility], [phone], [phone] (fax)  with H&P, clinical notes, progress notes and therapy notes for possible SNF placement This CSW was told the paperwork would be reviewed on Monday 01/31/2020 and they would contact this CSW within 24 hours.        Expected Discharge Plan and Services           Expected Discharge Date: 01/27/20                                     Social Determinants of Health (SDOH) Interventions    Readmission Risk Interventions Readmission Risk Prevention Plan 01/17/2020  Medication Review ([place]) Complete  HRI or Home Care Consult Complete  Palliative Care Screening Not Applicable  Some recent data might be hidden

## 2020-01-27 NOTE — Progress Notes (Signed)
Physical Therapy Treatment Patient Details Name: Jason Montes MRN: [id number] DOB: [birth year] Today's Date: 01/27/2020    History of Present Illness Jason Montes is a 65yoM who comes to [facility] 8/11 c AMS, overdose. Patientseen by podiatry as he was scheduled for procedure for partial left first ray amputation on 8/13.  Patient did not have cardiology clearance and needed it prior to surgery. Pt underwent Lt hallux amputation on 8/13, revascularization procedureon 8/16. PMH: HTN, HLD, DM, CVA, GERD, depression, RLS, PVD, CAD, CKD-3, AF on Eliquis, chronic Lt hallux osteomyelitis, sCHF with EF of 40-45%    PT Comments    Pt was awake laying in bed with HOB elevated ~ 20 degrees. He agrees to PT session and is more oriented overall but continues to present with cognition deficits. Poor insight of deficits and reason for being in hospital. He was however able to follow commands consistently throughout.Pt required min assist to exit R side of bed. Sat EOB x ~ 5 minutes prior to standing and transferring. BP in supine 108/66 upon sitting up 95/56 but pt endorses no c/o dizziness or feelings of lightheadedness. He was able to stand with much improved safety and less assistance. Even able to take steps to recliner but required constant Mod assist to prevent fall. Breakfast tray set up in front of patient at conclusion of session with BLEs elevated/floating and chair alarm in place. RN aware of pt's abilities. Acute PT will continue to follow per POC. Pt will benefit from SNF at DC to address deficits and improve safe functional mobility.    Follow Up Recommendations  Supervision/Assistance - 24 hour;Supervision for mobility/OOB;SNF     Equipment Recommendations  Other (comment) (defer to next level of care)    Recommendations for Other Services       Precautions / Restrictions Precautions Precautions: Fall Precaution Comments: Suicide precautions Required Braces or Orthoses: Other  Brace Other Brace: postop shoe Restrictions Weight Bearing Restrictions: Yes RLE Weight Bearing: Weight bearing as tolerated LLE Weight Bearing: Weight bearing as tolerated    Mobility  Bed Mobility Overal bed mobility: Needs Assistance Bed Mobility: Supine to Sit     Supine to sit: Min assist     General bed mobility comments: Min assist to exit R side of bed. Vcs and increased time required  Transfers Overall transfer level: Needs assistance Equipment used: Rolling walker (2 wheeled) Transfers: Sit to/from Stand Sit to Stand: Min assist;Mod assist;From elevated surface         General transfer comment: Pt was able to demonstrat improved fwd wt shift today which impacted pt's transfer ability greatly. much less assistance to stand today.  Ambulation/Gait Ambulation/Gait assistance: Mod assist Gait Distance (Feet): 3 Feet Assistive device: Rolling walker (2 wheeled) Gait Pattern/deviations: Step-to pattern Gait velocity: decreased   General Gait Details: pt was able to take ~ 3 steps to turn to recliner. continues to have unsteadiness and required constant mod assist to prevent posterior fall.   Stairs             Wheelchair Mobility    Modified Rankin (Stroke Patients Only)       Balance Overall balance assessment: Needs assistance Sitting-balance support: Bilateral upper extremity supported;Feet supported Sitting balance-Leahy Scale: Fair Sitting balance - Comments: less severe posterior lean but does continue to require Vcs for fwd wt shift   Standing balance support: Bilateral upper extremity supported;During functional activity Standing balance-Leahy Scale: Poor Standing balance comment: high fall risk. poor balance with all standing  activity especially with dynamic movements                            Cognition Arousal/Alertness: Awake/alert Behavior During Therapy: WFL for tasks assessed/performed Overall Cognitive Status: History  of cognitive impairments - at baseline                                 General Comments: Pt was long sitting in bed upon arriving. He is alert and more oriented today but still present with cognition deficits. unaware of reason for hospitalization.      Exercises      General Comments        Pertinent Vitals/Pain Pain Assessment: No/denies pain    Home Living                      Prior Function            PT Goals (current goals can now be found in the care plan section) Acute Rehab PT Goals Patient Stated Goal: none stated Progress towards PT goals: Progressing toward goals    Frequency    7X/week      PT Plan Current plan remains appropriate;Other (comment)    Co-evaluation              AM-PAC PT "6 Clicks" Mobility   Outcome Measure  Help needed turning from your back to your side while in a flat bed without using bedrails?: A Little Help needed moving from lying on your back to sitting on the side of a flat bed without using bedrails?: A Lot Help needed moving to and from a bed to a chair (including a wheelchair)?: A Lot Help needed standing up from a chair using your arms (e.g., wheelchair or bedside chair)?: A Lot Help needed to walk in hospital room?: A Lot Help needed climbing 3-5 steps with a railing? : Total 6 Click Score: 12    End of Session Equipment Utilized During Treatment: Gait belt Activity Tolerance: Patient tolerated treatment well Patient left: in chair;with call bell/phone within reach;with chair alarm set;with nursing/sitter in room Nurse Communication: Mobility status PT Visit Diagnosis: Difficulty in walking, not elsewhere classified (R26.2);Unsteadiness on feet (R26.81);Other abnormalities of gait and mobility (R26.89);Muscle weakness (generalized) (M62.81);Other symptoms and signs involving the nervous system (F35.456)     Time: 2563-8937 PT Time Calculation (min) (ACUTE ONLY): 19 min  Charges:   $Therapeutic Activity: 8-22 mins                     [name] Fusi PTA 01/27/20, 10:14 AM

## 2020-01-27 NOTE — Care Management Important Message (Signed)
Important Message  Patient Details  Name: Jason Montes MRN: [id number] Date of Birth: 19-Aug-1950   Medicare Important Message Given:  Yes     Dannette Barbara 01/27/2020, 1:55 PM

## 2020-01-27 NOTE — Discharge Summary (Addendum)
Physician Discharge Summary  Patient ID: Jason Montes MRN: [id number] DOB/AGE: 1951/04/18 69 y.o.  Admit date: 01/11/2020 Discharge date: 02/03/2020  Admission Diagnoses:  Discharge Diagnoses:  Principal Problem:   Acute metabolic encephalopathy Active Problems:   Atrial fibrillation (HCC)   CAD (coronary artery disease)   GERD with esophagitis   Chronic systolic CHF (congestive heart failure) (HCC)   Hyperlipidemia   Essential hypertension   Acute renal failure superimposed on stage 3a chronic kidney disease (HCC)   Protein-calorie malnutrition, severe (HCC)   Stage 3a chronic kidney disease   Osteomyelitis of great toe of left foot (HCC)   Type II diabetes mellitus with renal manifestations (HCC)   Stroke ([place])   Depression   Suicidal behavior   Pressure injury of skin   Acute urinary retention   Hypotension   Nonsustained ventricular tachycardia (HCC)   Aspiration pneumonia of both lower lobes due to gastric secretions ([place])   Wernicke encephalopathy   Encounter for psychological evaluation   Discharged Condition: good  Hospital Course:  Jason Montes a 69 y.o.Caucasian malewith medical history significant ofhypertension, hyperlipidemia, diabetes mellitus, stroke, GERD, depression, RLS, PVD, CAD, CKD-3, atrial fibrillation on Eliquis, chronic left greater toe osteomyelitis,sCHF with EF of 40-45%, who presents with altered mental status and overdose ?unintentional  #1.  Acute metabolic encephalopathy, delirium, history of anoxic brain injury. Patient condition has improved.    He is a seen by psychiatry, neurology, he was started on high dose of thiamine for suspect of Wernicke's syndrome.  He was also placed on antipsychotics by psychiatry.  Condition finally improved.  Currently he is alert and oriented to time place and person.  2.  Drug overdose. Seen by psychiatry, Dr. Janese Banks has cleared the patient for discharge.  He no longer has any suicidal  ideation.  3.  Osteomyelitis of the left great toe.  Status post amputation. Antibiotics completed.  Follow-up with podiatry as outpatient.  4.  Hypotension. Blood pressure has improved.  No evidence of adrenal insufficiency.  Intermittent orthostasis Pt has varying degree of dizziness upon standing, likely due to prolonged bed rest and immobility.  1 episode of BP dropping to 80's upon standing from 120's lying, but repeat orthostatic BP measurement neg for orthostasis.  5.  Bilateral lower lobe aspiration pneumonia. Improved.  6.  Urinary retention. Patient had a bladder residual of 1370 mL, Foley catheter was anchored.  Attempt to remove catheter was without success.  Continue Foley catheter, follow-up with urology as outpatient.  7.  Chronic systolic congestive heart failure with the pulmonary hypertension. Stable.  8.  Uncontrolled type 2 diabetes with hyperglycemia pain Resume home medicines and continue insulin regimen developed while inpatient.  9.  Severe protein calorie malnutrition. Condition is improving, appetite improving.  10.  Paroxysmal atrial fibrillation with nonsustained ventricular tachycardia. Continue Eliquis and amiodarone.  pressure injury of sacrum stage 2., unable to determine whether POA   Consults: psychiatry, podiatry, neurology, cardiology, vascular surgery  Significant Diagnostic Studies:    Treatments: antibiotics, anti-psychotic medicine, high dose thiamine  Discharge Exam: Blood pressure 118/62, pulse 80, temperature (!) 97.5 F (36.4 C), temperature source Oral, resp. rate 20, height 5\' 9"  (1.753 m), weight 71.9 kg, SpO2 94 %. General appearance: alert and cooperative Resp: clear to auscultation bilaterally Cardio: regular rate and rhythm, S1, S2 normal, no murmur, click, rub or gallop GI: soft, non-tender; bowel sounds normal; no masses,  no organomegaly Extremities: extremities normal, atraumatic, no cyanosis or  edema  Disposition:  Discharge disposition: 70-Another Health Care Institution Not Defined       Discharge Instructions    Discharge wound care:   Complete by: As directed    Continue current wound care orders.     Allergies as of 02/03/2020      Reactions   Amlodipine Other (See Comments)   Norvasc [amlodipine Besylate]    Unknown   Penicillins    Childhood allergy, not sure what happens       Medication List    STOP taking these medications   betamethasone valerate 0.1 % cream Commonly known as: VALISONE   carvedilol 6.25 MG tablet Commonly known as: COREG   cetirizine 10 MG tablet Commonly known as: ZYRTEC   cyclobenzaprine 10 MG tablet Commonly known as: FLEXERIL   docusate sodium 50 MG capsule Commonly known as: COLACE   HYDROcodone-acetaminophen 5-325 MG tablet Commonly known as: NORCO/VICODIN   lisinopril 2.5 MG tablet Commonly known as: ZESTRIL   mirtazapine 15 MG tablet Commonly known as: REMERON   senna 8.6 MG Tabs tablet Commonly known as: SENOKOT   torsemide 20 MG tablet Commonly known as: DEMADEX   traMADol 50 MG tablet Commonly known as: ULTRAM     TAKE these medications   amiodarone 400 MG tablet Commonly known as: PACERONE Take 0.5 tablets (200 mg total) by mouth daily.   aspirin EC 81 MG tablet Take 81 mg by mouth daily.   atorvastatin 40 MG tablet Commonly known as: LIPITOR Take 40 mg by mouth at bedtime.   cyanocobalamin 1000 MCG/ML injection Commonly known as: (VITAMIN B-12) Inject 1,000 mcg into the muscle every 30 (thirty) days.   DULoxetine 20 MG capsule Commonly known as: CYMBALTA Take 1 capsule (20 mg total) by mouth daily.   Eliquis 5 MG Tabs tablet Generic drug: apixaban Take 5 mg by mouth 2 (two) times daily.   empagliflozin 25 MG Tabs tablet Commonly known as: JARDIANCE Take 12.5 mg by mouth daily.   famotidine 20 MG tablet Commonly known as: PEPCID Take 20 mg by mouth daily.   feeding supplement  (GLUCERNA SHAKE) Liqd Take 237 mLs by mouth 2 (two) times daily between meals. What changed: Another medication with the same name was removed. Continue taking this medication, and follow the directions you see here.   fluticasone 50 MCG/ACT nasal spray Commonly known as: FLONASE Place 2 sprays into both nostrils daily.   gabapentin 300 MG capsule Commonly known as: NEURONTIN Take 1 capsule (300 mg total) by mouth 3 (three) times daily. What changed: when to take this   glucose 4 GM chewable tablet Chew 4 tablets by mouth as needed for low blood sugar.   haloperidol 1 MG tablet Commonly known as: HALDOL Take 1 tablet (1 mg total) by mouth 2 (two) times daily.   insulin aspart 100 UNIT/ML injection Commonly known as: novoLOG Inject 6 Units into the skin 3 (three) times daily with meals.   insulin glargine 100 UNIT/ML injection Commonly known as: LANTUS Inject 0.1 mLs (10 Units total) into the skin daily. Start taking on: February 04, 2020   metFORMIN 500 MG tablet Commonly known as: GLUCOPHAGE Start 1 tab by mouth in am daily for 7 days. Then increase to 1 tab in am & pm for 7 days.  Continue weekly increase to 2 tabs twice daily. What changed:   how much to take  how to take this  when to take this  additional instructions   nitroGLYCERIN 0.4 MG SL tablet Commonly  known as: NITROSTAT Place 0.4 mg under the tongue every 5 (five) minutes as needed for chest pain.   olanzapine zydis 15 MG disintegrating tablet Commonly known as: ZYPREXA Take 1 tablet (15 mg total) by mouth at bedtime.   polyethylene glycol 17 g packet Commonly known as: MIRALAX / GLYCOLAX Take 17 g by mouth 2 (two) times daily.   Semaglutide (1 MG/DOSE) 2 MG/1.5ML Sopn Inject 1 mg into the skin once a week.   tamsulosin 0.4 MG Caps capsule Commonly known as: FLOMAX Take 0.4 mg by mouth daily.   thiamine 100 MG tablet Take 1 tablet (100 mg total) by mouth daily.   Vitamin D3 25 MCG (1000  UT) Caps Take 1,000 Units by mouth daily.            Discharge Care Instructions  (From admission, onward)         Start     Ordered   02/03/20 0000  Discharge wound care:       Comments: Continue current wound care orders.   02/03/20 8182          Follow-up Information    Kris Hartmann, NP. Go on 02/27/2020.   Specialty: Vascular Surgery Why: Seen as consult. Will need ABI with visit. 3pm appointment Contact information: [address] [place] [address] [phone]        Caroline More, DPM. Schedule an appointment as soon as possible for a visit on 02/03/2020.   Specialty: Podiatry Why: 1:45pm appointment Contact information: [place] [address] [phone]        Clinic, [place] In 1 week.   Contact information: [place] [address] [phone]        Abbie Sons, MD In 2 weeks.   Specialty: Urology Why: for urinary retention Contact information: [place] [address] [phone]               Signed: Sharen Hones 01/27/2020, 1:01 PM   30 minutes.  Signed: Enzo Bi, MD

## 2020-01-27 NOTE — Progress Notes (Signed)
Nutrition Follow Up Note   DOCUMENTATION CODES:   Severe malnutrition in context of chronic illness  INTERVENTION:   Ensure Enlive po TID, each supplement provides 350 kcal and 20 grams of protein  MVI daily   Vitamin C 500mg  po BID  Liberalize diet (finger foods)  Consider appetite stimulant   NUTRITION DIAGNOSIS:   Severe Malnutrition related to chronic illness (CKD, CHF, DM) as evidenced by severe muscle depletion, severe fat depletion.  GOAL:   Patient will meet greater than or equal to 90% of their needs  -progressing   MONITOR:   PO intake, Supplement acceptance, Labs, Weight trends, Skin, I & O's  ASSESSMENT:   69 y.o. Caucasian male with medical history significant of hypertension, hyperlipidemia, diabetes mellitus, stroke, GERD, depression, RLS, PVD, CAD, CKD-3, atrial fibrillation on Eliquis, chronic left greater toe osteomyelitis, sCHF with EF of 40-45%, who presents with altered mental status and overdose   Pt with improved appetite and oral intake. Pt eating anywhere from sips/bites to 80% of meals in hospital and is drinking some Ensure supplements. Would recommended appetite stimulant to try and improve pt's oral intake. Pt is at refeed risk. Per chart, pt is weight stable since admit.   Medications reviewed and include: vitamin C, aspirin, D3, plavix, pepcid, insulin, senokot, miralax, B12 MVI   Labs reviewed: creat 1.28(H) Hgb 9.2(L), Hct 27.7(L)  Diet Order:    Diet Order            DIET FINGER FOODS Room service appropriate? Yes; Fluid consistency: Thin  Diet effective now                EDUCATION NEEDS:   Education needs have been addressed  Skin:  Skin Assessment: Reviewed RN Assessment (Stage II sacrum, incision L foot, skin tear R hand)  Last BM:  8/24  Height:   Ht Readings from Last 1 Encounters:  01/23/20 5\' 9"  (1.753 m)    Weight:   Wt Readings from Last 1 Encounters:  01/26/20 75.6 kg    Ideal Body Weight:  72.7  kg  BMI:  Body mass index is 24.61 kg/m.  Estimated Nutritional Needs:   Kcal:  2000-2300kcal/day  Protein:  100-115g/day  Fluid:  1.8-2.1L/day  Koleen Distance MS, RD, LDN Please refer to [facility] for RD and/or RD on-call/weekend/after hours pager

## 2020-01-27 NOTE — TOC Progression Note (Signed)
Transition of Care [facility]) - Progression Note    Patient Details  Name: Jason Montes MRN: [id number] Date of Birth: Dec 08, 1950  Transition of Care [facility]) CM/SW [place], RN Phone Number: 01/27/2020, 4:17 PM  Clinical Narrative:    Attempted to outreach to Pamala Hurry [facility]) @  [place] [phone] for requested assessment. Bea states she  unexpectedly had to leave her home and is not able to complete assessment as scheduled. Rescheduled for 10AM on Monday in patients room.         Expected Discharge Plan and Services           Expected Discharge Date: 01/27/20                                     Social Determinants of Health (SDOH) Interventions    Readmission Risk Interventions Readmission Risk Prevention Plan 01/17/2020  Medication Review ([place]) Complete  HRI or Home Care Consult Complete  Palliative Care Screening Not Applicable  Some recent data might be hidden

## 2020-01-28 DIAGNOSIS — I959 Hypotension, unspecified: Secondary | ICD-10-CM

## 2020-01-28 DIAGNOSIS — R338 Other retention of urine: Secondary | ICD-10-CM

## 2020-01-28 DIAGNOSIS — I4729 Other ventricular tachycardia: Secondary | ICD-10-CM

## 2020-01-28 LAB — GLUCOSE, CAPILLARY
Glucose-Capillary: 197 mg/dL — ABNORMAL HIGH (ref 70–99)
Glucose-Capillary: 248 mg/dL — ABNORMAL HIGH (ref 70–99)
Glucose-Capillary: 213 mg/dL — ABNORMAL HIGH (ref 70–99)
Glucose-Capillary: 248 mg/dL — ABNORMAL HIGH (ref 70–99)

## 2020-01-28 NOTE — Progress Notes (Signed)
PROGRESS NOTE    [name]  Jason Montes:[id number] DOB: 25-Dec-1950 DOA: 01/11/2020 PCP: [facility], [facility]   Chief complaint.  Altered mental status.  Brief Narrative:  Jason Foss [name] a 69 y.o.Caucasian malewith medical history significant ofhypertension, hyperlipidemia, diabetes mellitus, stroke, GERD, depression, RLS, PVD, CAD, CKD-3, atrial fibrillation on Eliquis, chronic left greater toe osteomyelitis,sCHF with EF of 40-45%, who presents with altered mental status and overdose ?unintentional   Assessment & Plan:   Principal Problem:   Acute metabolic encephalopathy Active Problems:   Atrial fibrillation (HCC)   CAD (coronary artery disease)   GERD with esophagitis   Chronic systolic CHF (congestive heart failure) (HCC)   Hyperlipidemia   Essential hypertension   Acute renal failure superimposed on stage 3a chronic kidney disease ([place])   Protein-calorie malnutrition, severe (HCC)   Stage 3a chronic kidney disease   Osteomyelitis of great toe of left foot (HCC)   Type II diabetes mellitus with renal manifestations ([place])   Stroke (HCC)   Depression   Suicidal behavior   Pressure injury of skin Hypotension Nonsustained ventricular tachycardia Type 2 diabetes uncontrolled with hyperglycemia  Patient condition has been stabilized.  Continue thiamine supplement.  Pending SNF placement.    DVT prophylaxis:Eliquis Code Status:Full Family Communication:None Disposition Plan:  Patient came from:  Anticipated d/c place:?  Barriers to d/c OR conditions which need to be met to effect a safe d/c:   Consultants:  Psychiatry and neurology.  Procedures:Foley Antimicrobials:None    Subjective: Patient doing well today.  He was scheduled to be transferred to nursing home yesterday, nursing home refused transfer yesterday.   Currently doing well without any short of breath.  No confusion.  Good appetite without nausea vomiting.  Objective: Vitals:   01/27/20 1203 01/27/20 2044 01/28/20 0442 01/28/20 1207  BP: 98/60 111/65 108/63 (!) 105/55  Pulse: 83 81 79 77  Resp: $Remo'18 16 18 16  'aYbMs$ Temp: (!) 97.4 F (36.3 C) 98.4 F (36.9 C) 97.7 F (36.5 C) (!) 97.5 F (36.4 C)  TempSrc: Oral Oral Oral Oral  SpO2: 96% 96% 96% 95%  Weight:   79.7 kg   Height:        Intake/Output Summary (Last 24 hours) at 01/28/2020 1405 Last data filed at 01/28/2020 0846 Gross per 24 hour  Intake 630 ml  Output 2025 ml  Net -1395 ml   Filed Weights   01/24/20 0300 01/26/20 0500 01/28/20 0442  Weight: 78.3 kg 75.6 kg 79.7 kg    Examination:  General exam: Appears calm and comfortable  Respiratory system: Clear to auscultation. Respiratory effort normal. Cardiovascular system: S1 & S2 heard, RRR. No JVD, murmurs, rubs, gallops or clicks. No pedal edema. Gastrointestinal system: Abdomen is nondistended, soft and nontender. No organomegaly or masses felt. Normal bowel sounds heard. Central nervous system: Alert and oriented. No focal neurological deficits. Extremities: Symmetric 5 x 5 power. Skin: No rashes, lesions or ulcers Psychiatry: Judgement and insight appear normal. Mood & affect appropriate.     Data Reviewed: I have personally reviewed following labs and imaging studies  CBC: Recent Labs  Lab 01/23/20 0646 01/24/20 0450 01/25/20 0520 01/26/20 0616 01/27/20 0444  WBC 13.4* 10.5 11.0* 9.9 8.8  HGB 9.1* 9.0* 8.8* 9.1* 9.2*  HCT 28.5* 27.7* 27.3* 27.9* 27.7*  MCV 89.1 87.1 87.5 87.2 87.1  PLT 182 174 214 199 867   Basic Metabolic Panel: Recent Labs  Lab 01/23/20 0425 01/24/20 0450 01/25/20 0520 01/26/20 0616 01/27/20 0444  NA 135  136 138 135 136  K 3.9 3.8 4.1 4.3 4.6  CL 109 106 107 104 104  CO2 19* $Remov'22 24 24 26  'lJMRkE$ GLUCOSE 69* 104* 119* 157* 97  BUN $Re'16 15 16 18 21  'yCE$ CREATININE 1.16 1.17 1.32* 1.42*  1.28*  CALCIUM 8.3* 8.3* 8.2* 8.6* 8.6*  MG 2.5* 2.6* 2.5* 2.6* 2.4   GFR: Estimated Creatinine Clearance: 56 mL/min (A) (by C-G formula based on SCr of 1.28 mg/dL (H)). Liver Function Tests: No results for input(s): AST, ALT, ALKPHOS, BILITOT, PROT, ALBUMIN in the last 168 hours. No results for input(s): LIPASE, AMYLASE in the last 168 hours. No results for input(s): AMMONIA in the last 168 hours. Coagulation Profile: No results for input(s): INR, PROTIME in the last 168 hours. Cardiac Enzymes: No results for input(s): CKTOTAL, CKMB, CKMBINDEX, TROPONINI in the last 168 hours. BNP (last 3 results) No results for input(s): PROBNP in the last 8760 hours. HbA1C: No results for input(s): HGBA1C in the last 72 hours. CBG: Recent Labs  Lab 01/27/20 1203 01/27/20 1641 01/27/20 2129 01/28/20 0816 01/28/20 1207  GLUCAP 300* 218* 305* 197* 248*   Lipid Profile: No results for input(s): CHOL, HDL, LDLCALC, TRIG, CHOLHDL, LDLDIRECT in the last 72 hours. Thyroid Function Tests: No results for input(s): TSH, T4TOTAL, FREET4, T3FREE, THYROIDAB in the last 72 hours. Anemia Panel: No results for input(s): VITAMINB12, FOLATE, FERRITIN, TIBC, IRON, RETICCTPCT in the last 72 hours. Sepsis Labs: No results for input(s): PROCALCITON, LATICACIDVEN in the last 168 hours.  Recent Results (from the past 240 hour(s))  SARS Coronavirus 2 by RT PCR (hospital order, performed in [facility] hospital lab) Nasopharyngeal Nasopharyngeal Swab     Status: None   Collection Time: 01/27/20 12:00 PM   Specimen: Nasopharyngeal Swab  Result Value Ref Range Status   SARS Coronavirus 2 NEGATIVE NEGATIVE Final    Comment: (NOTE) SARS-CoV-2 target nucleic acids are NOT DETECTED.  The SARS-CoV-2 RNA is generally detectable in upper and lower respiratory specimens during the acute phase of infection. The lowest concentration of SARS-CoV-2 viral copies this assay can detect is 250 copies / mL. A negative result  does not preclude SARS-CoV-2 infection and should not be used as the sole basis for treatment or other patient management decisions.  A negative result may occur with improper specimen collection / handling, submission of specimen other than nasopharyngeal swab, presence of viral mutation(s) within the areas targeted by this assay, and inadequate number of viral copies (<250 copies / mL). A negative result must be combined with clinical observations, patient history, and epidemiological information.  Fact Sheet for Patients:   StrictlyIdeas.no  Fact Sheet for Healthcare Providers: BankingDealers.co.za  This test is not yet approved or  cleared by the [place] FDA and has been authorized for detection and/or diagnosis of SARS-CoV-2 by FDA under an Emergency Use Authorization (EUA).  This EUA will remain in effect (meaning this test can be used) for the duration of the COVID-19 declaration under Section 564(b)(1) of the Act, 21 U.S.C. section 360bbb-3(b)(1), unless the authorization is terminated or revoked sooner.  Performed at [facility], [address], [place], [place] [address]          Radiology Studies: No results found.      Scheduled Meds: . amiodarone  400 mg Oral Daily  . apixaban  5 mg Oral BID  . vitamin C  500 mg Oral BID  . aspirin EC  81 mg Oral Daily  . atorvastatin  40 mg Oral QHS  . Chlorhexidine Gluconate Cloth  6 each Topical Daily  . cholecalciferol  1,000 Units Oral Daily  . clopidogrel  75 mg Oral Daily  . DULoxetine  20 mg Oral Daily  . famotidine  20 mg Oral Daily  . feeding supplement (ENSURE ENLIVE)  237 mL Oral TID BM  . fluticasone  2 spray Each Nare Daily  . gabapentin  200 mg Oral TID  . haloperidol  1 mg Oral BID  . insulin aspart  0-15 Units Subcutaneous TID AC & HS  . insulin glargine  8 Units Subcutaneous Daily  . multivitamin with minerals  1 tablet Oral Daily  .  OLANZapine zydis  12.5 mg Oral QHS  . polyethylene glycol  17 g Oral BID  . tamsulosin  0.4 mg Oral Daily  . vitamin B-12  1,000 mcg Oral Daily   Continuous Infusions: . sodium chloride 500 mL (01/27/20 1711)     LOS: 16 days    Time spent: 26 minutes    Sharen Hones, MD [facility]   To contact the attending provider between 7A-7P or the covering provider during after hours 7P-7A, please log into the web site [email or web address] and access using universal [place] password for that web site. If you do not have the password, please call the hospital operator.  01/28/2020, 2:05 PM

## 2020-01-28 NOTE — Progress Notes (Addendum)
Physical Therapy Treatment Patient Details Name: Jason Montes MRN: [id number] DOB: 16-Mar-1951 Today's Date: 01/28/2020    History of Present Illness Jason Montes is a 7yoM who comes to [facility] 8/11 c AMS, overdose. Patientseen by podiatry as he was scheduled for procedure for partial left first ray amputation on 8/13.  Patient did not have cardiology clearance and needed it prior to surgery. Pt underwent Lt hallux amputation on 8/13, revascularization procedureon 8/16. PMH: HTN, HLD, DM, CVA, GERD, depression, RLS, PVD, CAD, CKD-3, AF on Eliquis, chronic Lt hallux osteomyelitis, sCHF with EF of 40-45%    PT Comments    Pt was long sitting in bed upon arriving. He greets Chief Strategy Officer but is disoriented to setting. With reorientation pt's cognition greatly improves. He is able to correctly state day, month, year. Once he realized he is in hospital was able to state reason for being in hospital. He is able to follow commands consistently throughout and is very motivated to improve/progress ambulation and abilities. Was able to achieve EOB short sit without physical assistance however requires a lot of time and constant vcs for technique and sequencing. Sat EOB for prolong period until dizziness/lightheadedness resolves. Pt has had low BP trends throughout hospital course. BP 112/66 after sitting for ~ 5 minutes EOB. Sao2 on rm air 90% in sitting however dropped to 86% with ambulation. 2 L o2 used during gait training but discontinued once pt was seated in recliner.Throughout session pt was in boater shoe (sock used for toe filler) on RLE and post op shoe on LLE. He was able to stand from elevated surfaces with min assist + a lot of cueing. Mod assist to stand from recliner/lower surface heights. Pt tends to have slight posterior push in standing. He did ambulate to doorway of room and return with min assist. Continues to be high fall risk and unsafe to I'ly ambulate. At conclusion of session, pt is  sitting in recliner with chair alarm in place, call bell in reach and BLEs elevated. He would greatly benefit from rehab at DC however placement issues may make this transition difficulty. If pt is to DC home, will benefit from HHPT. Continued benefit from skilled PT will address his balance, strength, mobility, and safety deficits.      Follow Up Recommendations  SNF;CIR;Supervision/Assistance - 24 hour;Supervision for mobility/OOB;Other (comment) (Likely will have to DC home with HHPT 2/2 placement difficul)     Equipment Recommendations  Other (comment);None recommended by PT (Pt reports not having enough room at boarding house for w/c)    Recommendations for Other Services OT consult     Precautions / Restrictions Precautions Precautions: Fall Precaution Comments: Suicide precautions Required Braces or Orthoses: Other Brace Other Brace: postop shoe Restrictions Weight Bearing Restrictions: Yes RLE Weight Bearing: Weight bearing as tolerated LLE Weight Bearing: Weight bearing as tolerated    Mobility  Bed Mobility Overal bed mobility: Modified Independent Bed Mobility: Supine to Sit     Supine to sit: Modified independent (Device/Increase time);HOB elevated     General bed mobility comments: Pt was able to long sit to short sit EOB without assistance. Alot of time required and vcs for technique/sequencing.  Transfers Overall transfer level: Needs assistance Equipment used: Rolling walker (2 wheeled) Transfers: Sit to/from Stand Sit to Stand: Min assist;Mod assist;From elevated surface         General transfer comment: Min assist + max vcs for fwd wt shift, handplacement, and improved technique form elevated bed height. requires mod assist  to stand from recliner to Rw. continues to have posterior lean upon standing and needs assistance  Ambulation/Gait Ambulation/Gait assistance: Min assist Gait Distance (Feet): 25 Feet Assistive device: Rolling walker (2  wheeled) Gait Pattern/deviations: Step-through pattern;Decreased stride length;Staggering left;Staggering right;Trunk flexed Gait velocity: decreased   General Gait Details: Pt requires min assist to prevent fall throughout gait training. He endorses weakness throughout and has progressing knee flexion as he fatigues. Continues to be high fall risk. Pt required O2 during ambulation 2/2 to desat to 86 with all standing activity however once in sitting and with breathing techniques is able to achieve 92% on rm air   Stairs             Wheelchair Mobility    Modified Rankin (Stroke Patients Only)       Balance Overall balance assessment: Needs assistance Sitting-balance support: Bilateral upper extremity supported;Feet supported Sitting balance-Leahy Scale: Fair Sitting balance - Comments: Pt was able to sit EOB x several minutes prior to standing without LOB bowever does endorse dizziness. BP 110/55 uupon sitting up that elevated to 112/66 after prolonged sitting   Standing balance support: Bilateral upper extremity supported;During functional activity Standing balance-Leahy Scale: Poor Standing balance comment: high fall risk. poor balance with all standing activity especially with dynamic movements                            Cognition Arousal/Alertness: Awake/alert Behavior During Therapy: WFL for tasks assessed/performed Overall Cognitive Status: History of cognitive impairments - at baseline                                 General Comments: Pt is alert and able to follow commands however requires reorientation to hospital and situation. was able to correctly state day and year and month but states." we are at the breakfast place right?" when ask where he is.      Exercises      General Comments        Pertinent Vitals/Pain Pain Assessment: No/denies pain    Home Living                      Prior Function            PT  Goals (current goals can now be found in the care plan section) Acute Rehab PT Goals Patient Stated Goal: none stated Progress towards PT goals: Progressing toward goals    Frequency    Min 2X/week      PT Plan Frequency needs to be updated;Discharge plan needs to be updated (decrease frequency to 2 x a week )    Co-evaluation              AM-PAC PT "6 Clicks" Mobility   Outcome Measure  Help needed turning from your back to your side while in a flat bed without using bedrails?: A Little Help needed moving from lying on your back to sitting on the side of a flat bed without using bedrails?: A Little Help needed moving to and from a bed to a chair (including a wheelchair)?: A Lot Help needed standing up from a chair using your arms (e.g., wheelchair or bedside chair)?: A Lot Help needed to walk in hospital room?: A Lot Help needed climbing 3-5 steps with a railing? : A Lot 6 Click Score: 14    End  of Session Equipment Utilized During Treatment: Gait belt Activity Tolerance: Patient tolerated treatment well Patient left: in chair;with call bell/phone within reach;with chair alarm set;with nursing/sitter in room Nurse Communication: Mobility status PT Visit Diagnosis: Difficulty in walking, not elsewhere classified (R26.2);Unsteadiness on feet (R26.81);Other abnormalities of gait and mobility (R26.89);Muscle weakness (generalized) (M62.81);Other symptoms and signs involving the nervous system (R29.898)     Time: 4695-0722 PT Time Calculation (min) (ACUTE ONLY): 34 min  Charges:  $Gait Training: 8-22 mins $Therapeutic Activity: 8-22 mins                     [name] Fusi PTA 01/28/20, 10:53 AM

## 2020-01-29 LAB — GLUCOSE, CAPILLARY
Glucose-Capillary: 216 mg/dL — ABNORMAL HIGH (ref 70–99)
Glucose-Capillary: 288 mg/dL — ABNORMAL HIGH (ref 70–99)
Glucose-Capillary: 178 mg/dL — ABNORMAL HIGH (ref 70–99)

## 2020-01-29 NOTE — Evaluation (Signed)
Occupational Therapy Evaluation Patient Details Name: Jason Montes MRN: [id number] DOB: 1951/03/02 Today's Date: 01/29/2020    History of Present Illness Jason Montes is a 75yoM who comes to [facility] 8/11 c AMS, overdose. Patientseen by podiatry as he was scheduled for procedure for partial left first ray amputation on 8/13.  Patient did not have cardiology clearance and needed it prior to surgery. Pt underwent Lt hallux amputation on 8/13, revascularization procedureon 8/16. PMH: HTN, HLD, DM, CVA, GERD, depression, RLS, PVD, CAD, CKD-3, AF on Eliquis, chronic Lt hallux osteomyelitis, sCHF with EF of 40-45%   Clinical Impression   Jason Montes was seen for OT evaluation this date. Prior to hospital admission, pt was MOD I for mobility and ADLs, requiring assist from PCS 2.5 hrs/day for IADLs. Pt presents to acute OT demonstrating impaired ADL performance, functional cognition, and functional mobility 2/2 decreased LB access, functional strength/ROM/balance deficits, decreased activity tolerance, and visual scanning deficits. Pt currently requires MAX A don L post-op shoe seated EOB, MOD A don R personal shoe (stuffed toes for balance). MOD A + RW sit<>stand at EOB. MIN A + RW tooth brushing standing sink side - pt required MAX VCs for visual scanning to locate toothpaste left of sink and single UE support for balance, tolerated ~2 mins standing prior to initating return to bed reporting fatigue. Pt required MIN VCs to identify HEP and appropriate time for engaging in t/o day. Pt would benefit from skilled OT to address noted impairments and functional limitations (see below for any additional details) in order to maximize safety and independence while minimizing falls risk and caregiver burden. Upon hospital discharge, recommend CIR to maximize pt safety and return to functional independence during meaningful occupations of daily life.     Follow Up Recommendations  CIR;Supervision/Assistance  - 24 hour    Equipment Recommendations  Other (comment) (TBD at next venue of care)    Recommendations for Other Services       Precautions / Restrictions Precautions Precautions: Fall Precaution Comments: Suicide precautions Required Braces or Orthoses: Other Brace Other Brace: postop shoe Restrictions Weight Bearing Restrictions: Yes RLE Weight Bearing: Weight bearing as tolerated LLE Weight Bearing: Weight bearing as tolerated ( heel weight bearing in postop shoe )      Mobility Bed Mobility Overal bed mobility: Needs Assistance Bed Mobility: Supine to Sit;Sit to Supine   Supine to sit: Supervision    Transfers Overall transfer level: Needs assistance Equipment used: Rolling walker (2 wheeled) Transfers: Sit to/from Stand Sit to Stand: Mod assist   General transfer comment: MOD A for lift off/balance sit>stand, assist for eccentric control stand>sit    Balance Overall balance assessment: Needs assistance Sitting-balance support: No upper extremity supported;Feet supported Sitting balance-Leahy Scale: Fair Sitting balance - Comments: lateral/posterior LOBs c weight shifting/functional reach   Standing balance support: Single extremity supported;During functional activity Standing balance-Leahy Scale: Poor Standing balance comment: high fall risk. poor balance with all standing activity especially with dynamic movements        ADL either performed or assessed with clinical judgement   ADL Overall ADL's : Needs assistance/impaired      General ADL Comments: MAX A don L post-op shoe seated EOB, MOD A don R personal shoe (stuffed toes for balance). MIN A + RW tooth brushing standing sink side - pt required MAX VCs for visual scanning to locate toothpaste left of sink and single UE support for balance, tolerated ~2 mins standing prior to initating return to bed  reporting fatigue.       Pertinent Vitals/Pain Pain Assessment: No/denies pain     Hand Dominance  Left   Extremity/Trunk Assessment Upper Extremity Assessment Upper Extremity Assessment: RUE deficits/detail RUE Deficits / Details: AAROM WFL, hx of CVA weakness on R side   Lower Extremity Assessment Lower Extremity Assessment: Generalized weakness       Communication Communication Communication: No difficulties   Cognition Arousal/Alertness: Awake/alert Behavior During Therapy: WFL for tasks assessed/performed Overall Cognitive Status: History of cognitive impairments - at baseline      General Comments: Oriented to situation, requires MAX VCs for visual scanning   General Comments       Exercises Exercises: Other exercises Other Exercises Other Exercises: Pt educated re: OT role, DME recs, d/c recs, HEP, falls prevention Other Exercises: LBD, grooming, sup<>sit, sit<>stand, sitting/standing balance/tolerance   Shoulder Instructions      Home Living Family/patient expects to be discharged to:: Other (Comment) (Boarding home) Living Arrangements: Alone Available Help at Discharge: Personal care attendant;Available PRN/intermittently (PCA assists 2.5 hrs/day c cooking/cleaning/groceries)    Home Equipment: Walker - 2 wheels          Prior Functioning/Environment Level of Independence: Needs assistance        Comments: Assist for IADLs        OT Problem List: Decreased strength;Decreased range of motion;Decreased activity tolerance;Impaired balance (sitting and/or standing);Impaired vision/perception;Decreased cognition;Decreased safety awareness      OT Treatment/Interventions: Self-care/ADL training;Therapeutic exercise;Energy conservation;DME and/or AE instruction;Therapeutic activities;Cognitive remediation/compensation;Visual/perceptual remediation/compensation;Patient/family education;Balance training    OT Goals(Current goals can be found in the care plan section) Acute Rehab OT Goals Patient Stated Goal: To go to rehab OT Goal Formulation: With  patient Time For Goal Achievement: 02/12/20 Potential to Achieve Goals: Fair ADL Goals Pt Will Perform Grooming: with min guard assist;standing (c LRAD PRN for >8 mins) Pt Will Perform Lower Body Dressing: with min assist;sit to/from stand (c LRAD PRN) Pt Will Transfer to Toilet: with min guard assist;bedside commode;ambulating (c LRAD PRN) Pt/caregiver will Perform Home Exercise Program: Increased ROM;Increased strength;Right Upper extremity;Independently  OT Frequency: Min 2X/week   Barriers to D/C: Inaccessible home environment;Decreased caregiver support          AM-PAC OT "6 Clicks" Daily Activity     Outcome Measure Help from another person eating meals?: None Help from another person taking care of personal grooming?: A Little Help from another person toileting, which includes using toliet, bedpan, or urinal?: A Lot Help from another person bathing (including washing, rinsing, drying)?: A Lot Help from another person to put on and taking off regular upper body clothing?: A Little Help from another person to put on and taking off regular lower body clothing?: A Lot 6 Click Score: 16   End of Session Equipment Utilized During Treatment: Rolling walker  Activity Tolerance: Patient tolerated treatment well Patient left: in bed;with call bell/phone within reach;with bed alarm set  OT Visit Diagnosis: Other abnormalities of gait and mobility (R26.89);Muscle weakness (generalized) (M62.81)                Time: 3903-0092 OT Time Calculation (min): 22 min Charges:  OT General Charges $OT Visit: 1 Visit OT Evaluation $OT Eval Moderate Complexity: 1 Mod OT Treatments $Self Care/Home Management : 8-22 mins  Dessie Coma, M.S. OTR/L  01/29/20, 12:23 PM  ascom [phone]

## 2020-01-29 NOTE — Progress Notes (Signed)
PROGRESS NOTE    Jason Montes  SUP:[id number] DOB: 1950/11/20 DOA: 01/11/2020 PCP: [facility]   Chief complaint.  Altered mental status   Brief Narrative:  Jason Punch [name] a 69 y.o.Caucasian malewith medical history significant ofhypertension, hyperlipidemia, diabetes mellitus, stroke, GERD, depression, RLS, PVD, CAD, CKD-3, atrial fibrillation on Eliquis, chronic left greater toe osteomyelitis,sCHF with EF of 40-45%, who presents with altered mental status and overdose ?unintentional  Assessment & Plan:   Principal Problem:   Acute metabolic encephalopathy Active Problems:   Atrial fibrillation (HCC)   CAD (coronary artery disease)   GERD with esophagitis   Chronic systolic CHF (congestive heart failure) (HCC)   Hyperlipidemia   Essential hypertension   Acute renal failure superimposed on stage 3a chronic kidney disease ([place])   Protein-calorie malnutrition, severe (HCC)   Stage 3a chronic kidney disease   Osteomyelitis of great toe of left foot (HCC)   Type II diabetes mellitus with renal manifestations ([place])   Stroke ([place])   Depression   Suicidal behavior   Pressure injury of skin   Acute urinary retention   Hypotension   Nonsustained ventricular tachycardia ([place])  Patient condition has been stabilized.  No new issue.  Pending SNF placement.        Subjective: Patient doing well today.  No confusion.  Denies any short of breath or cough. No diarrhea or constipation.  No abdominal pain or nausea vomiting.  No fever or chills.  Objective: Vitals:   01/28/20 1207 01/28/20 1933 01/29/20 0419 01/29/20 0426  BP: (!) 105/55 107/62 (!) 106/59   Pulse: 77 75 77   Resp: 16 18 16    Temp: (!) 97.5 F (36.4 C) 98.2 F (36.8 C) 98.2 F (36.8 C)   TempSrc: Oral Oral    SpO2: 95% 95% 95%   Weight:    75.8 kg  Height:        Intake/Output Summary (Last 24 hours) at 01/29/2020 1159 Last data filed at 01/29/2020 0644 Gross per 24 hour    Intake 300 ml  Output 3000 ml  Net -2700 ml   Filed Weights   01/26/20 0500 01/28/20 0442 01/29/20 0426  Weight: 75.6 kg 79.7 kg 75.8 kg    Examination:  General exam: Appears calm and comfortable  Respiratory system: Clear to auscultation. Respiratory effort normal. Cardiovascular system: S1 & S2 heard, RRR. No JVD, murmurs, rubs, gallops or clicks. No pedal edema. Gastrointestinal system: Abdomen is nondistended, soft and nontender. No organomegaly or masses felt. Normal bowel sounds heard. Central nervous system: Alert and oriented. No focal neurological deficits. Extremities: Symmetric Skin: No rashes, lesions or ulcers Psychiatry: Judgement and insight appear normal. Mood & affect appropriate.     Data Reviewed: I have personally reviewed following labs and imaging studies  CBC: Recent Labs  Lab 01/23/20 0646 01/24/20 0450 01/25/20 0520 01/26/20 0616 01/27/20 0444  WBC 13.4* 10.5 11.0* 9.9 8.8  HGB 9.1* 9.0* 8.8* 9.1* 9.2*  HCT 28.5* 27.7* 27.3* 27.9* 27.7*  MCV 89.1 87.1 87.5 87.2 87.1  PLT 182 174 214 199 592   Basic Metabolic Panel: Recent Labs  Lab 01/23/20 0425 01/24/20 0450 01/25/20 0520 01/26/20 0616 01/27/20 0444  NA 135 136 138 135 136  K 3.9 3.8 4.1 4.3 4.6  CL 109 106 107 104 104  CO2 19* 22 24 24 26   GLUCOSE 69* 104* 119* 157* 97  BUN 16 15 16 18 21   CREATININE 1.16 1.17 1.32* 1.42* 1.28*  CALCIUM 8.3* 8.3*  8.2* 8.6* 8.6*  MG 2.5* 2.6* 2.5* 2.6* 2.4   GFR: Estimated Creatinine Clearance: 56 mL/min (A) (by C-G formula based on SCr of 1.28 mg/dL (H)). Liver Function Tests: No results for input(s): AST, ALT, ALKPHOS, BILITOT, PROT, ALBUMIN in the last 168 hours. No results for input(s): LIPASE, AMYLASE in the last 168 hours. No results for input(s): AMMONIA in the last 168 hours. Coagulation Profile: No results for input(s): INR, PROTIME in the last 168 hours. Cardiac Enzymes: No results for input(s): CKTOTAL, CKMB, CKMBINDEX, TROPONINI  in the last 168 hours. BNP (last 3 results) No results for input(s): PROBNP in the last 8760 hours. HbA1C: No results for input(s): HGBA1C in the last 72 hours. CBG: Recent Labs  Lab 01/28/20 0816 01/28/20 1207 01/28/20 1628 01/28/20 2105 01/29/20 0848  GLUCAP 197* 248* 213* 248* 216*   Lipid Profile: No results for input(s): CHOL, HDL, LDLCALC, TRIG, CHOLHDL, LDLDIRECT in the last 72 hours. Thyroid Function Tests: No results for input(s): TSH, T4TOTAL, FREET4, T3FREE, THYROIDAB in the last 72 hours. Anemia Panel: No results for input(s): VITAMINB12, FOLATE, FERRITIN, TIBC, IRON, RETICCTPCT in the last 72 hours. Sepsis Labs: No results for input(s): PROCALCITON, LATICACIDVEN in the last 168 hours.  Recent Results (from the past 240 hour(s))  SARS Coronavirus 2 by RT PCR (hospital order, performed in [facility] hospital lab) Nasopharyngeal Nasopharyngeal Swab     Status: None   Collection Time: 01/27/20 12:00 PM   Specimen: Nasopharyngeal Swab  Result Value Ref Range Status   SARS Coronavirus 2 NEGATIVE NEGATIVE Final    Comment: (NOTE) SARS-CoV-2 target nucleic acids are NOT DETECTED.  The SARS-CoV-2 RNA is generally detectable in upper and lower respiratory specimens during the acute phase of infection. The lowest concentration of SARS-CoV-2 viral copies this assay can detect is 250 copies / mL. A negative result does not preclude SARS-CoV-2 infection and should not be used as the sole basis for treatment or other patient management decisions.  A negative result may occur with improper specimen collection / handling, submission of specimen other than nasopharyngeal swab, presence of viral mutation(s) within the areas targeted by this assay, and inadequate number of viral copies (<250 copies / mL). A negative result must be combined with clinical observations, patient history, and epidemiological information.  Fact Sheet for Patients:     StrictlyIdeas.no  Fact Sheet for Healthcare Providers: BankingDealers.co.za  This test is not yet approved or  cleared by the [place] FDA and has been authorized for detection and/or diagnosis of SARS-CoV-2 by FDA under an Emergency Use Authorization (EUA).  This EUA will remain in effect (meaning this test can be used) for the duration of the COVID-19 declaration under Section 564(b)(1) of the Act, 21 U.S.C. section 360bbb-3(b)(1), unless the authorization is terminated or revoked sooner.  Performed at [facility], [address], [place], [place] [address]          Radiology Studies: No results found.      Scheduled Meds: . amiodarone  400 mg Oral Daily  . apixaban  5 mg Oral BID  . vitamin C  500 mg Oral BID  . aspirin EC  81 mg Oral Daily  . atorvastatin  40 mg Oral QHS  . Chlorhexidine Gluconate Cloth  6 each Topical Daily  . cholecalciferol  1,000 Units Oral Daily  . clopidogrel  75 mg Oral Daily  . DULoxetine  20 mg Oral Daily  . famotidine  20 mg Oral Daily  .  feeding supplement (ENSURE ENLIVE)  237 mL Oral TID BM  . fluticasone  2 spray Each Nare Daily  . gabapentin  200 mg Oral TID  . haloperidol  1 mg Oral BID  . insulin aspart  0-15 Units Subcutaneous TID AC & HS  . insulin glargine  8 Units Subcutaneous Daily  . multivitamin with minerals  1 tablet Oral Daily  . OLANZapine zydis  12.5 mg Oral QHS  . polyethylene glycol  17 g Oral BID  . tamsulosin  0.4 mg Oral Daily  . vitamin B-12  1,000 mcg Oral Daily   Continuous Infusions: . sodium chloride 500 mL (01/27/20 1711)     LOS: 17 days    Time spent: 26 minutes    Sharen Hones, MD [facility]   To contact the attending provider between 7A-7P or the covering provider during after hours 7P-7A, please log into the web site [email or web address] and access using universal [place] password for that web site. If you do not have  the password, please call the hospital operator.  01/29/2020, 11:59 AM

## 2020-01-29 NOTE — Progress Notes (Signed)
Inpatient Rehab Admissions Coordinator:  Attempted to contact pt via phone. No answer in room.  Also attempted to contact pt's daughter, Donah Driver.  Left message for her on voicemail.  Still awaiting OT evaluation and discharge recommendations.   Gayland Curry, [place], [place] Admissions Coordinator [phone]

## 2020-01-29 NOTE — Progress Notes (Signed)
Inpatient Rehab Admissions Coordinator:  Pt's daughter, Donah Driver returned called. She reported that she would not be able to provide supervision/assistance for pt after d/c.  Will continue to monitor pt's progress with therapies in order to help determine appropriateness for potential CIR admission.   Gayland Curry, [place], [place] Admissions Coordinator [phone]

## 2020-01-30 LAB — CBC
HCT: 30.4 % — ABNORMAL LOW (ref 39.0–52.0)
Hemoglobin: 9.6 g/dL — ABNORMAL LOW (ref 13.0–17.0)
MCH: 28.4 pg (ref 26.0–34.0)
MCHC: 31.6 g/dL (ref 30.0–36.0)
MCV: 89.9 fL (ref 80.0–100.0)
Platelets: 257 10*3/uL (ref 150–400)
RBC: 3.38 MIL/uL — ABNORMAL LOW (ref 4.22–5.81)
RDW: 16 % — ABNORMAL HIGH (ref 11.5–15.5)
WBC: 7.5 10*3/uL (ref 4.0–10.5)
nRBC: 0 % (ref 0.0–0.2)

## 2020-01-30 LAB — GLUCOSE, CAPILLARY
Glucose-Capillary: 183 mg/dL — ABNORMAL HIGH (ref 70–99)
Glucose-Capillary: 212 mg/dL — ABNORMAL HIGH (ref 70–99)
Glucose-Capillary: 225 mg/dL — ABNORMAL HIGH (ref 70–99)
Glucose-Capillary: 257 mg/dL — ABNORMAL HIGH (ref 70–99)
Glucose-Capillary: 306 mg/dL — ABNORMAL HIGH (ref 70–99)

## 2020-01-30 LAB — CREATININE, SERUM
Creatinine, Ser: 1.27 mg/dL — ABNORMAL HIGH (ref 0.61–1.24)
GFR calc Af Amer: >60 mL/min (ref >60)
GFR calc non Af Amer: 58 mL/min — ABNORMAL LOW (ref >60)

## 2020-01-30 NOTE — TOC Progression Note (Signed)
Transition of Care [facility]) - Progression Note    Patient Details  Name: Jason Montes MRN: [id number] Date of Birth: [birth year]  Transition of Care [facility]) CM/SW [place], RN Phone Number: 01/30/2020, 11:54 AM  Clinical Narrative:    Completed  Must PASRR assessment at bedside with Lavone Neri" @ [phone].         Expected Discharge Plan and Services           Expected Discharge Date: 01/27/20                                     Social Determinants of Health (SDOH) Interventions    Readmission Risk Interventions Readmission Risk Prevention Plan 01/17/2020  Medication Review ([place]) Complete  HRI or Home Care Consult Complete  Palliative Care Screening Not Applicable  Some recent data might be hidden

## 2020-01-30 NOTE — Progress Notes (Signed)
Inpatient Diabetes Program Recommendations  AACE/ADA: New Consensus Statement on Inpatient Glycemic Control   Target Ranges:  Prepandial:   less than 140 mg/dL      Peak postprandial:   less than 180 mg/dL (1-2 hours)      Critically ill patients:  140 - 180 mg/dL   Results for LAUTARO, KORAL (MRN [id number]) as of 01/30/2020 14:39  Ref. Range 01/29/2020 08:48 01/29/2020 12:16 01/29/2020 17:15 01/30/2020 00:31 01/30/2020 08:04 01/30/2020 13:28  Glucose-Capillary Latest Ref Range: 70 - 99 mg/dL 216 (H) 288 (H) 178 (H) 183 (H) 212 (H) 306 (H)   Review of Glycemic Control  Diabetes history: DM2 Outpatient Diabetes medications: Jardiance 12.5 mg daily, Metformin 500 mg BID, Ozempic 1 mg Qweek Current orders for Inpatient glycemic control: Lantus 8 units daily, Novolog 0-15 units AC&HS  Inpatient Diabetes Program Recommendations:    Insulin-Please consider increasing Lantus to 10 units daily and adding Novolog 3 units TID with meals for meal coverage if patient eats at least 50% of meals.  Thanks, Barnie Alderman, RN, MSN, CDE Diabetes Coordinator Inpatient Diabetes Program [phone] (Team Pager from 8am to 5pm)

## 2020-01-30 NOTE — Progress Notes (Signed)
Daily Progress Note   Subjective  - 2w Post-Op  F/u left great toe amp.  Resting comfortably in bed.  Patient appears more stable at this time mentally and is able to hold a conversation compared to previous visits.  Patient states he is doing well and much better than before his admission.  Objective Vitals:   01/29/20 1216 01/29/20 2102 01/30/20 0451 01/30/20 1138  BP: (!) 113/55 (!) 107/56 120/60 (!) 115/59  Pulse: 81 79 79 79  Resp: 18 20 20 16   Temp: (!) 97.5 F (36.4 C) 97.6 F (36.4 C) (!) 97.5 F (36.4 C) 97.9 F (36.6 C)  TempSrc: Oral Oral Oral Oral  SpO2: 95% 93% 94% 97%  Weight:      Height:        Physical Exam: Incision coapted.  Skin flaps intact and well perfused.  No s/s infection.  Appears to have some dry hematoma around the incision line, will continue to monitor this.  Patient has previously had gangrene to this area in the past and could be potential that skin flaps may necrosis further.     Laboratory CBC    Component Value Date/Time   WBC 7.5 01/30/2020 0653   HGB 9.6 (L) 01/30/2020 0653   HCT 30.4 (L) 01/30/2020 0653   PLT 257 01/30/2020 0653    BMET    Component Value Date/Time   NA 136 01/27/2020 0444   K 4.6 01/27/2020 0444   CL 104 01/27/2020 0444   CO2 26 01/27/2020 0444   GLUCOSE 97 01/27/2020 0444   BUN 21 01/27/2020 0444   CREATININE 1.28 (H) 01/27/2020 0444   CALCIUM 8.6 (L) 01/27/2020 0444   GFRNONAA 58 (L) 01/27/2020 0444   GFRAA >60 01/27/2020 0444    Assessment/Planning: S/P great toe amputation PVD s/p revascularization - poor flow overall DM2 with polyneuropathy   Doing well.  Dressing changed.  Incision and flaps look stable.  No acute signs of infection present.  Will continually monitor the skin flaps on an outpatient basis as there are subtle signs for necrosis versus hematoma.  We will continue to treat this conservatively with local wound care.  Should maintain minimal weight to foot.  From podiatry  standpoint ok to d/c.   F/U outpt 1-2 weeks.   Keep dressing clean dry and intact.  Caroline More , DPM  01/30/2020, 1:52 PM

## 2020-01-30 NOTE — TOC Progression Note (Signed)
Transition of Care [facility]) - Progression Note    Patient Details  Name: Jason Montes MRN: [id number] Date of Birth: April 09, 1951  Transition [facility] Care [facility]) CM/SW Contact  Beverly Sessions, RN Phone Number: 01/30/2020, 3:30 PM  Clinical Narrative:     Per Ander Purpura Rehab Admissions Coordinator for [facility].  Dr Gretchen Portela to assess patient on 01/31/20       Expected Discharge Plan and Services           Expected Discharge Date: 01/27/20                                     Social Determinants of Health (SDOH) Interventions    Readmission Risk Interventions Readmission Risk Prevention Plan 01/17/2020  Medication Review ([place]) Complete  HRI or Home Care Consult Complete  Palliative Care Screening Not Applicable  Some recent data might be hidden

## 2020-01-30 NOTE — Progress Notes (Signed)
Physical Therapy Treatment Patient Details Name: CORWIN KUIKEN MRN: [id number] DOB: 09-Jul-1950 Today's Date: 01/30/2020    History of Present Illness Malaki Koury is a 12yoM who comes to [facility] 8/11 c AMS, overdose. Patientseen by podiatry as he was scheduled for procedure for partial left first ray amputation on 8/13.  Patient did not have cardiology clearance and needed it prior to surgery. Pt underwent Lt hallux amputation on 8/13, revascularization procedureon 8/16. PMH: HTN, HLD, DM, CVA, GERD, depression, RLS, PVD, CAD, CKD-3, AF on Eliquis, chronic Lt hallux osteomyelitis, sCHF with EF of 40-45%    PT Comments    Pt was pleasant and motivated to participate during the session.  Pt put forth good effort during the session but did require min A with both bed mobility and transfers.  Pt encouraged to attempt increased amb distance this session but was only able to amb a max of 25' before requiring to return to sitting.  Pt reported no adverse symptoms other than fatigue during the session with SpO2 >/= 92% on room air and HR WNL. Pt will benefit from PT services in an IR setting upon discharge to safely address deficits listed in patient problem list for decreased caregiver assistance and eventual return to PLOF.     Follow Up Recommendations  CIR;Supervision/Assistance - 24 hour     Equipment Recommendations  None recommended by PT (TBD at next venue of care)    Recommendations for Other Services       Precautions / Restrictions Precautions Precautions: Fall Precaution Comments: Suicide precautions Other Brace: postop shoe Restrictions Weight Bearing Restrictions: Yes RLE Weight Bearing: Weight bearing as tolerated LLE Weight Bearing: Weight bearing as tolerated LLE Partial Weight Bearing Percentage or Pounds: LLE FWB through the heel with post op shoe donned    Mobility  Bed Mobility Overal bed mobility: Needs Assistance Bed Mobility: Supine to Sit     Supine  to sit: Min assist     General bed mobility comments: Min A to come to full upright sitting position  Transfers Overall transfer level: Needs assistance Equipment used: Rolling walker (2 wheeled) Transfers: Sit to/from Stand Sit to Stand: Min assist         General transfer comment: Min A to come to full upright standing and to prevent posterior LOB upon initial stand  Ambulation/Gait Ambulation/Gait assistance: Min guard Gait Distance (Feet): 25 Feet Assistive device: Rolling walker (2 wheeled) Gait Pattern/deviations: Step-through pattern;Trunk flexed;Drifts right/left;Decreased step length - right;Decreased step length - left Gait velocity: decreased   General Gait Details: Encouraged increased amb distance but pt unable to amb > 25 feet this session; slow cadence but steady without LOB, SpO2 >/= 92% on room air with HR WNL, mod verbal cues for WB through L heel only   Stairs             Wheelchair Mobility    Modified Rankin (Stroke Patients Only)       Balance Overall balance assessment: Needs assistance Sitting-balance support: No upper extremity supported;Feet supported Sitting balance-Leahy Scale: Fair     Standing balance support: Bilateral upper extremity supported;During functional activity Standing balance-Leahy Scale: Poor Standing balance comment: Min A to prevent posterior LOB upon initial stand                            Cognition Arousal/Alertness: Awake/alert Behavior During Therapy: WFL for tasks assessed/performed Overall Cognitive Status: History of cognitive impairments - at  baseline                                        Exercises Total Joint Exercises Ankle Circles/Pumps: AROM;Strengthening;Both;15 reps;10 reps Quad Sets: Strengthening;Both;10 reps;15 reps Gluteal Sets: Strengthening;Both;10 reps;15 reps Hip ABduction/ADduction: Strengthening;Both;15 reps Straight Leg Raises: Strengthening;Both;15  reps Long Arc Quad: Strengthening;Both;15 reps Knee Flexion: Strengthening;Both;15 reps Other Exercises Other Exercises: HEP education for BLE APs, QS, and GS x 10 each every 1-2 hours    General Comments        Pertinent Vitals/Pain Pain Assessment: No/denies pain    Home Living                      Prior Function            PT Goals (current goals can now be found in the care plan section) Progress towards PT goals: Progressing toward goals    Frequency    Min 2X/week      PT Plan Current plan remains appropriate    Co-evaluation              AM-PAC PT "6 Clicks" Mobility   Outcome Measure  Help needed turning from your back to your side while in a flat bed without using bedrails?: A Little Help needed moving from lying on your back to sitting on the side of a flat bed without using bedrails?: A Little Help needed moving to and from a bed to a chair (including a wheelchair)?: A Little Help needed standing up from a chair using your arms (e.g., wheelchair or bedside chair)?: A Little Help needed to walk in hospital room?: A Little Help needed climbing 3-5 steps with a railing? : A Lot 6 Click Score: 17    End of Session Equipment Utilized During Treatment: Gait belt Activity Tolerance: Patient tolerated treatment well Patient left: in chair;with call bell/phone within reach;with chair alarm set Nurse Communication: Mobility status PT Visit Diagnosis: Difficulty in walking, not elsewhere classified (R26.2);Unsteadiness on feet (R26.81);Other abnormalities of gait and mobility (R26.89);Muscle weakness (generalized) (M62.81);Other symptoms and signs involving the nervous system (K86.381)     Time: 7711-6579 PT Time Calculation (min) (ACUTE ONLY): 29 min  Charges:  $Gait Training: 8-22 mins $Therapeutic Exercise: 8-22 mins                     D. Scott Benard Minturn PT, DPT 01/30/20, 3:30 PM

## 2020-01-30 NOTE — Progress Notes (Signed)
PROGRESS NOTE    Jason Montes  QQP:[id number] DOB: 1951-03-31 DOA: 01/11/2020 PCP: [facility]   Brief Narrative:  Jason Mckamie [name] a 69 y.o.Caucasian malewith medical history significant ofhypertension, hyperlipidemia, diabetes mellitus, stroke, GERD, depression, RLS, PVD, CAD, CKD-3, atrial fibrillation on Eliquis, chronic left greater toe osteomyelitis,sCHF with EF of 40-45%, who presents with altered mental status and overdose ?unintentional  Assessment & Plan:   Principal Problem:   Acute metabolic encephalopathy Active Problems:   Atrial fibrillation (HCC)   CAD (coronary artery disease)   GERD with esophagitis   Chronic systolic CHF (congestive heart failure) (HCC)   Hyperlipidemia   Essential hypertension   Acute renal failure superimposed on stage 3a chronic kidney disease ([place])   Protein-calorie malnutrition, severe (HCC)   Stage 3a chronic kidney disease   Osteomyelitis of great toe of left foot (HCC)   Type II diabetes mellitus with renal manifestations ([place])   Stroke ([place])   Depression   Suicidal behavior   Pressure injury of skin   Acute urinary retention   Hypotension   Nonsustained ventricular tachycardia ([place])  Patient condition has stabilized.  Pending placement.  All SNF's has refused his placement.  Now looking at possible acute rehab.  Will discharge when discharge options available.  Continue physical therapy/Occupational Therapy while in the hospital.  DVT prophylaxis:Eliquis Code Status:Full Family Communication:None Disposition Plan:  Patient came from:  Anticipated d/c place:?  Barriers to d/c OR conditions which need to be met to effect a safe d/c:   Consultants:  Psychiatry and neurology.  Procedures:[place] Antimicrobials:None  Subjective: Patient doing well.  Still unsteady when  he walks, but was able to cooperate with physical therapy.  No nausea vomiting diarrhea.  No short of breath no fever or chills.  Objective: Vitals:   01/29/20 1216 01/29/20 2102 01/30/20 0451 01/30/20 1138  BP: (!) 113/55 (!) 107/56 120/60 (!) 115/59  Pulse: 81 79 79 79  Resp: $Remo'18 20 20 16  'UVIhp$ Temp: (!) 97.5 F (36.4 C) 97.6 F (36.4 C) (!) 97.5 F (36.4 C) 97.9 F (36.6 C)  TempSrc: Oral Oral Oral Oral  SpO2: 95% 93% 94% 97%  Weight:      Height:        Intake/Output Summary (Last 24 hours) at 01/30/2020 1151 Last data filed at 01/30/2020 1015 Gross per 24 hour  Intake 360 ml  Output 2450 ml  Net -2090 ml   Filed Weights   01/26/20 0500 01/28/20 0442 01/29/20 0426  Weight: 75.6 kg 79.7 kg 75.8 kg    Examination:  General exam: Appears calm and comfortable  Respiratory system: Clear to auscultation. Respiratory effort normal. Cardiovascular system: S1 & S2 heard, RRR. No JVD, murmurs, rubs, gallops or clicks. No pedal edema. Gastrointestinal system: Abdomen is nondistended, soft and nontender. No organomegaly or masses felt. Normal bowel sounds heard. Central nervous system: Alert and oriented. No focal neurological deficits. Extremities: Symmetric 5 x 5 power. Skin: No rashes, lesions or ulcers Psychiatry: Judgement and insight appear normal. Mood & affect appropriate.     Data Reviewed: I have personally reviewed following labs and imaging studies  CBC: Recent Labs  Lab 01/24/20 0450 01/25/20 0520 01/26/20 0616 01/27/20 0444 01/30/20 0653  WBC 10.5 11.0* 9.9 8.8 7.5  HGB 9.0* 8.8* 9.1* 9.2* 9.6*  HCT 27.7* 27.3* 27.9* 27.7* 30.4*  MCV 87.1 87.5 87.2 87.1 89.9  PLT 174 214 199 196 712   Basic Metabolic Panel: Recent Labs  Lab 01/24/20 0450 01/25/20  5916 01/26/20 0616 01/27/20 0444  NA 136 138 135 136  K 3.8 4.1 4.3 4.6  CL 106 107 104 104  CO2 $Re'22 24 24 26  'swc$ GLUCOSE 104* 119* 157* 97  BUN $Re'15 16 18 21  'NkR$ CREATININE 1.17 1.32* 1.42* 1.28*  CALCIUM 8.3*  8.2* 8.6* 8.6*  MG 2.6* 2.5* 2.6* 2.4   GFR: Estimated Creatinine Clearance: 56 mL/min (A) (by C-G formula based on SCr of 1.28 mg/dL (H)). Liver Function Tests: No results for input(s): AST, ALT, ALKPHOS, BILITOT, PROT, ALBUMIN in the last 168 hours. No results for input(s): LIPASE, AMYLASE in the last 168 hours. No results for input(s): AMMONIA in the last 168 hours. Coagulation Profile: No results for input(s): INR, PROTIME in the last 168 hours. Cardiac Enzymes: No results for input(s): CKTOTAL, CKMB, CKMBINDEX, TROPONINI in the last 168 hours. BNP (last 3 results) No results for input(s): PROBNP in the last 8760 hours. HbA1C: No results for input(s): HGBA1C in the last 72 hours. CBG: Recent Labs  Lab 01/29/20 0848 01/29/20 1216 01/29/20 1715 01/30/20 0031 01/30/20 0804  GLUCAP 216* 288* 178* 183* 212*   Lipid Profile: No results for input(s): CHOL, HDL, LDLCALC, TRIG, CHOLHDL, LDLDIRECT in the last 72 hours. Thyroid Function Tests: No results for input(s): TSH, T4TOTAL, FREET4, T3FREE, THYROIDAB in the last 72 hours. Anemia Panel: No results for input(s): VITAMINB12, FOLATE, FERRITIN, TIBC, IRON, RETICCTPCT in the last 72 hours. Sepsis Labs: No results for input(s): PROCALCITON, LATICACIDVEN in the last 168 hours.  Recent Results (from the past 240 hour(s))  SARS Coronavirus 2 by RT PCR (hospital order, performed in [facility] hospital lab) Nasopharyngeal Nasopharyngeal Swab     Status: None   Collection Time: 01/27/20 12:00 PM   Specimen: Nasopharyngeal Swab  Result Value Ref Range Status   SARS Coronavirus 2 NEGATIVE NEGATIVE Final    Comment: (NOTE) SARS-CoV-2 target nucleic acids are NOT DETECTED.  The SARS-CoV-2 RNA is generally detectable in upper and lower respiratory specimens during the acute phase of infection. The lowest concentration of SARS-CoV-2 viral copies this assay can detect is 250 copies / mL. A negative result does not preclude SARS-CoV-2  infection and should not be used as the sole basis for treatment or other patient management decisions.  A negative result may occur with improper specimen collection / handling, submission of specimen other than nasopharyngeal swab, presence of viral mutation(s) within the areas targeted by this assay, and inadequate number of viral copies (<250 copies / mL). A negative result must be combined with clinical observations, patient history, and epidemiological information.  Fact Sheet for Patients:   StrictlyIdeas.no  Fact Sheet for Healthcare Providers: BankingDealers.co.za  This test is not yet approved or  cleared by the [place] FDA and has been authorized for detection and/or diagnosis of SARS-CoV-2 by FDA under an Emergency Use Authorization (EUA).  This EUA will remain in effect (meaning this test can be used) for the duration of the COVID-19 declaration under Section 564(b)(1) of the Act, 21 U.S.C. section 360bbb-3(b)(1), unless the authorization is terminated or revoked sooner.  Performed at [facility], [address] [place], [place] [address]          Radiology Studies: No results found.      Scheduled Meds: . amiodarone  400 mg Oral Daily  . apixaban  5 mg Oral BID  . vitamin C  500 mg Oral BID  . aspirin EC  81 mg Oral Daily  . atorvastatin  40 mg Oral QHS  . Chlorhexidine Gluconate Cloth  6 each Topical Daily  . cholecalciferol  1,000 Units Oral Daily  . clopidogrel  75 mg Oral Daily  . DULoxetine  20 mg Oral Daily  . famotidine  20 mg Oral Daily  . feeding supplement (ENSURE ENLIVE)  237 mL Oral TID BM  . fluticasone  2 spray Each Nare Daily  . gabapentin  200 mg Oral TID  . haloperidol  1 mg Oral BID  . insulin aspart  0-15 Units Subcutaneous TID AC & HS  . insulin glargine  8 Units Subcutaneous Daily  . multivitamin with minerals  1 tablet Oral Daily  . OLANZapine zydis  12.5 mg  Oral QHS  . polyethylene glycol  17 g Oral BID  . tamsulosin  0.4 mg Oral Daily  . vitamin B-12  1,000 mcg Oral Daily   Continuous Infusions: . sodium chloride 500 mL (01/27/20 1711)     LOS: 18 days    Time spent: 19 minutes    Sharen Hones, MD [facility]   To contact the attending provider between 7A-7P or the covering provider during after hours 7P-7A, please log into the web site [email or web address] and access using universal [place] password for that web site. If you do not have the password, please call the hospital operator.  01/30/2020, 11:51 AM

## 2020-01-30 NOTE — Care Management Important Message (Signed)
Important Message  Patient Details  Name: Jason Montes MRN: [id number] Date of Birth: 1951-04-25   Medicare Important Message Given:  Yes     Dannette Barbara 01/30/2020, 1:25 PM

## 2020-01-31 DIAGNOSIS — J690 Pneumonitis due to inhalation of food and vomit: Secondary | ICD-10-CM

## 2020-01-31 DIAGNOSIS — E512 Wernicke's encephalopathy: Secondary | ICD-10-CM

## 2020-01-31 LAB — GLUCOSE, CAPILLARY
Glucose-Capillary: 210 mg/dL — ABNORMAL HIGH (ref 70–99)
Glucose-Capillary: 229 mg/dL — ABNORMAL HIGH (ref 70–99)
Glucose-Capillary: 311 mg/dL — ABNORMAL HIGH (ref 70–99)
Glucose-Capillary: 213 mg/dL — ABNORMAL HIGH (ref 70–99)

## 2020-01-31 MED ORDER — INSULIN GLARGINE 100 UNIT/ML ~~LOC~~ SOLN
10.0000 [IU] | Freq: Every day | SUBCUTANEOUS | Status: DC
  Administered 2020-02-01 – 2020-02-03 (×3): 10 [IU] via SUBCUTANEOUS
  Filled 2020-01-31 (×3): qty 0.1

## 2020-01-31 MED ORDER — INSULIN ASPART 100 UNIT/ML ~~LOC~~ SOLN
3.0000 [IU] | Freq: Three times a day (TID) | SUBCUTANEOUS | Status: DC
  Administered 2020-01-31 – 2020-02-02 (×6): 3 [IU] via SUBCUTANEOUS
  Filled 2020-01-31 (×6): qty 1

## 2020-01-31 NOTE — Consult Note (Addendum)
Physical Medicine and Rehabilitation Consult Reason for Consult: Acute metabolic encephalopathy Referring Physician: Sharen Hones  HPI: Jason Montes is a 69 y.o. male who was admitted to [facility] with acute metabolic encephalopathy. He has a history of HTN, HLD, AB, stroke, GERD, depression, RLS, PVD, CAD, CKD3, atrial fibrillation on Eliquis, and chronic left greater toe osteomyelitis, CHF, who presented after unintentional overdose and underwent a toe amputation for his osteomyelitis and was treated for pneumonia. Today he was able to walk to the nursing station and back and he felt well. MinA. He would like to be able to do this more frequently. Denies pain. Does not have any suicidal ideation or depression anymore. Denies confusion.  Review of Systems  Constitutional: Negative for chills and fever.  HENT: Negative for hearing loss and tinnitus.   Eyes: Negative for blurred vision and double vision.  Respiratory: Negative for cough and hemoptysis.   Cardiovascular: Negative for chest pain and palpitations.  Gastrointestinal: Negative for heartburn and nausea.  Genitourinary: Negative for dysuria and urgency.  Musculoskeletal: Negative for myalgias and neck pain.  Skin: Negative for itching and rash.  Neurological: Negative for dizziness and tingling.  Endo/Heme/Allergies: Negative for environmental allergies. Does not bruise/bleed easily.  Psychiatric/Behavioral: Negative for depression and suicidal ideas.   Past Medical History:  Diagnosis Date  . Allergy   . Atrial fibrillation ([place])   . B12 deficiency   . Chronic kidney disease   . Chronic kidney disease (CKD), stage II (mild)   . Coronary atherosclerosis   . Diabetes mellitus without complication ([place])   . Elevated PSA   . Esophagitis   . GERD (gastroesophageal reflux disease)   . Heart attack ([place])   . Heart disease   . Hyperlipidemia   . Hypertension   . Hypokalemia   . Mild neurocognitive disorder   .  Osteoarthritis   . Presbyopia   . PVD (peripheral vascular disease) ([place])   . Restless leg syndrome   . Stroke (cerebrum) ([place])   . Stroke ([place])   . Subacute osteomyelitis ([place])   . Tinea unguium   . Uncompensated short term memory deficit    Past Surgical History:  Procedure Laterality Date  . AMPUTATION TOE Left 01/13/2020   Procedure: AMPUTATION RAY LEFT 1ST;  Surgeon: Caroline More, DPM;  Location: [facility] ORS;  Service: Podiatry;  Laterality: Left;  . CARDIAC PACEMAKER PLACEMENT    . COLONOSCOPY WITH PROPOFOL N/A 05/18/2018   Procedure: COLONOSCOPY WITH PROPOFOL;  Surgeon: [name], MD;  Location: [facility] ENDOSCOPY;  Service: Endoscopy;  Laterality: N/A;  . CORONARY ARTERY BYPASS GRAFT    . FOOT AMPUTATION Right   . INTRAMEDULLARY (IM) NAIL INTERTROCHANTERIC Right 11/16/2019   Procedure: INTRAMEDULLARY (IM) NAIL INTERTROCHANTRIC;  Surgeon: Thornton Park, MD;  Location: [facility] ORS;  Service: Orthopedics;  Laterality: Right;  . LOWER EXTREMITY ANGIOGRAPHY Left 10/10/2019   Procedure: Lower Extremity Angiography;  Surgeon: Algernon Huxley, MD;  Location: [place] CV LAB;  Service: Cardiovascular;  Laterality: Left;  . LOWER EXTREMITY ANGIOGRAPHY Left 01/16/2020   Procedure: Lower Extremity Angiography;  Surgeon: Algernon Huxley, MD;  Location: [place] CV LAB;  Service: Cardiovascular;  Laterality: Left;   Family History  Problem Relation Age of Onset  . Heart failure Mother   . Colon cancer Mother   . Diabetes Mother   . Thyroid disease Sister    Social History:  reports that he has never smoked. He has never used smokeless tobacco.  He reports that he does not drink alcohol and does not use drugs. Allergies:  Allergies  Allergen Reactions  . Amlodipine Other (See Comments)  . Norvasc [Amlodipine Besylate]     Unknown  . Penicillins     Childhood allergy, not sure what happens    Medications Prior to Admission  Medication Sig Dispense Refill  . apixaban (ELIQUIS) 5 MG  TABS tablet Take 5 mg by mouth 2 (two) times daily. (Patient not taking: Reported on 12/23/2019)    . aspirin EC 81 MG tablet Take 81 mg by mouth daily.    Marland Kitchen atorvastatin (LIPITOR) 40 MG tablet Take 40 mg by mouth at bedtime.     . betamethasone valerate (VALISONE) 0.1 % cream Apply 1 application topically 2 (two) times daily as needed. (Patient not taking: Reported on 12/23/2019)    . carvedilol (COREG) 6.25 MG tablet Take 3.125 mg by mouth 2 (two) times daily with a meal.     . cetirizine (ZYRTEC) 10 MG tablet Take 10 mg by mouth daily.    . Cholecalciferol (VITAMIN D3) 1000 units CAPS Take 1,000 Units by mouth daily.     . cyanocobalamin (,VITAMIN B-12,) 1000 MCG/ML injection Inject 1,000 mcg into the muscle every 30 (thirty) days. (Patient not taking: Reported on 12/23/2019)    . cyclobenzaprine (FLEXERIL) 10 MG tablet Take 10 mg by mouth 2 (two) times daily as needed for muscle spasms. (Patient not taking: Reported on 12/23/2019)    . docusate sodium (COLACE) 50 MG capsule Take 100 mg by mouth daily.  (Patient not taking: Reported on 12/23/2019)    . empagliflozin (JARDIANCE) 25 MG TABS tablet Take 12.5 mg by mouth daily.    . Ensure Max Protein (ENSURE MAX PROTEIN) LIQD Take 330 mLs (11 oz total) by mouth daily. (Patient not taking: Reported on 12/23/2019)    . famotidine (PEPCID) 20 MG tablet Take 20 mg by mouth daily.     . feeding supplement, GLUCERNA SHAKE, (GLUCERNA SHAKE) LIQD Take 237 mLs by mouth 2 (two) times daily between meals. (Patient not taking: Reported on 12/23/2019)  0  . fluticasone (FLONASE) 50 MCG/ACT nasal spray Place 2 sprays into both nostrils daily.     Marland Kitchen gabapentin (NEURONTIN) 300 MG capsule Take 1 capsule (300 mg total) by mouth 2 (two) times daily. (Patient taking differently: Take 300-600 mg by mouth See admin instructions. Take 300 mg by mouth in the morning and 600 mg at bedtime) 60 capsule 0  . glucose 4 GM chewable tablet Chew 4 tablets by mouth as needed for low blood  sugar.    . HYDROcodone-acetaminophen (NORCO/VICODIN) 5-325 MG tablet Take 1-2 tablets by mouth every 6 (six) hours as needed for severe pain. (Patient not taking: Reported on 12/23/2019) 20 tablet 0  . lisinopril (ZESTRIL) 2.5 MG tablet Take 2.5 mg by mouth daily.    . metFORMIN (GLUCOPHAGE) 500 MG tablet Start 1 tab by mouth in am daily for 7 days. Then increase to 1 tab in am & pm for 7 days.  Continue weekly increase to 2 tabs twice daily. (Patient taking differently: Take 500 mg by mouth 2 (two) times daily. ) 120 tablet 3  . mirtazapine (REMERON) 15 MG tablet Take 15 mg by mouth at bedtime.    . nitroGLYCERIN (NITROSTAT) 0.4 MG SL tablet Place 0.4 mg under the tongue every 5 (five) minutes as needed for chest pain.    . Semaglutide, 1 MG/DOSE, 2 MG/1.5ML SOPN Inject 1 mg into  the skin once a week.     . senna (SENOKOT) 8.6 MG TABS tablet Take 1 tablet (8.6 mg total) by mouth 2 (two) times daily. (Patient taking differently: Take 2 tablets by mouth in the morning and at bedtime. ) 120 tablet 0  . tamsulosin (FLOMAX) 0.4 MG CAPS capsule Take 0.4 mg by mouth daily.     Marland Kitchen torsemide (DEMADEX) 20 MG tablet Take 20 mg by mouth daily.     . traMADol (ULTRAM) 50 MG tablet Take 1 tablet (50 mg total) by mouth every 6 (six) hours. (Patient not taking: Reported on 12/23/2019) 20 tablet 0    Home: Home Living Family/patient expects to be discharged to:: Other (Comment) Living Arrangements: Alone Available Help at Discharge: Personal care attendant, Available PRN/intermittently (PCA assists 2.5 hrs/day c cooking/cleaning/groceries) Home Equipment: Walker - 2 wheels  Functional History: Prior Function Level of Independence: Needs assistance Comments: Assist for IADLs Functional Status:  Mobility: Bed Mobility Overal bed mobility: Needs Assistance Bed Mobility: Supine to Sit Supine to sit: Min assist Sit to supine: Mod assist General bed mobility comments: Min A to come to full upright sitting  position Transfers Overall transfer level: Needs assistance Equipment used: Rolling walker (2 wheeled) Transfers: Sit to/from Stand Sit to Stand: Min assist Stand pivot transfers: Max assist, From elevated surface General transfer comment: Min A to come to full upright standing and to prevent posterior LOB upon initial stand Ambulation/Gait Ambulation/Gait assistance: Min guard Gait Distance (Feet): 25 Feet Assistive device: Rolling walker (2 wheeled) Gait Pattern/deviations: Step-through pattern, Trunk flexed, Drifts right/left, Decreased step length - right, Decreased step length - left General Gait Details: Encouraged increased amb distance but pt unable to amb > 25 feet this session; slow cadence but steady without LOB, SpO2 >/= 92% on room air with HR WNL Gait velocity: decreased    ADL: ADL Overall ADL's : Needs assistance/impaired General ADL Comments: MAX A don L post-op shoe seated EOB, MOD A don R personal shoe (stuffed toes for balance). MIN A + RW tooth brushing standing sink side - pt required MAX VCs for visual scanning to locate toothpaste left of sink and single UE support for balance, tolerated ~2 mins standing prior to initating return to bed reporting fatigue.   Cognition: Cognition Overall Cognitive Status: History of cognitive impairments - at baseline Orientation Level: Oriented X4 Cognition Arousal/Alertness: Awake/alert Behavior During Therapy: WFL for tasks assessed/performed Overall Cognitive Status: History of cognitive impairments - at baseline Area of Impairment: Orientation, Memory, Awareness, Safety/judgement, Problem solving Orientation Level: Disoriented to, Place, Time, Situation Memory: Decreased recall of precautions, Decreased short-term memory Safety/Judgement: Decreased awareness of safety, Decreased awareness of deficits Awareness: Intellectual Problem Solving: Slow processing, Decreased initiation, Requires verbal cues, Requires tactile  cues, Difficulty sequencing General Comments: Oriented to situation, requires MAX VCs for visual scanning  Blood pressure 117/64, pulse 82, temperature 97.6 F (36.4 C), temperature source Oral, resp. rate 18, height 5\' 9"  (1.753 m), weight 74.5 kg, SpO2 93 %. Physical Exam  General: Alert and oriented x 3, No apparent distress HEENT: Head is normocephalic, atraumatic, PERRLA, EOMI, sclera anicteric, oral mucosa pink and moist, dentition intact, ext ear canals clear,  Neck: Supple without JVD or lymphadenopathy Heart: Reg rate and rhythm. No murmurs rubs or gallops Chest: CTA bilaterally without wheezes, rales, or rhonchi; no distress Abdomen: Soft, non-tender, non-distended, bowel sounds positive. Extremities: No clubbing, cyanosis, or edema. Pulses are 2+ Skin: Clean and intact without signs of breakdown Neuro: Pt  is cognitively appropriate with normal insight, memory, and awareness. Able to answer questions regarding his history accurately. Able to spell WORLD backwards. Cranial nerves 2-12 are intact. Sensory exam is normal. Reflexes are 2+ in all 4's. Fine motor coordination is intact. No tremors. Motor function is grossly 5/5, left foot not tested given recent amputation.  Musculoskeletal: Left foot in boot.  Psych: Pt's affect is appropriate. Pt is cooperative GU: foley in place  Results for orders placed or performed during the hospital encounter of 01/11/20 (from the past 24 hour(s))  Glucose, capillary     Status: Abnormal   Collection Time: 01/30/20  4:37 PM  Result Value Ref Range   Glucose-Capillary 257 (H) 70 - 99 mg/dL  Glucose, capillary     Status: Abnormal   Collection Time: 01/30/20  9:01 PM  Result Value Ref Range   Glucose-Capillary 225 (H) 70 - 99 mg/dL  Glucose, capillary     Status: Abnormal   Collection Time: 01/31/20  8:03 AM  Result Value Ref Range   Glucose-Capillary 210 (H) 70 - 99 mg/dL  Glucose, capillary     Status: Abnormal   Collection Time: 01/31/20  11:51 AM  Result Value Ref Range   Glucose-Capillary 229 (H) 70 - 99 mg/dL   No results found.   Assessment/Plan: Diagnosis: Acute metabolic encephalopathy 1. Does the need for close, 24 hr/day medical supervision in concert with the patient's rehab needs make it unreasonable for this patient to be served in a less intensive setting? Yes 2. Co-Morbidities requiring supervision/potential complications: suicidal ideation, overdose, HTN, HLD, atrial fibrillation, stage 3a chronic kidney disease, osteomyelitis of left great toe 3. Due to bladder management, bowel management, safety, skin/wound care, disease management, medication administration, pain management and patient education, does the patient require 24 hr/day rehab nursing? Yes 4. Does the patient require coordinated care of a physician, rehab nurse, therapy disciplines of PT, OT to address physical and functional deficits in the context of the above medical diagnosis(es)? Yes Addressing deficits in the following areas: balance, endurance, locomotion, strength, transferring, bowel/bladder control, bathing, dressing, feeding, grooming, toileting, cognition and psychosocial support 5. Can the patient actively participate in an intensive therapy program of at least 3 hrs of therapy per day at least 5 days per week? Yes 6. The potential for patient to make measurable gains while on inpatient rehab is good 7. Anticipated functional outcomes upon discharge from inpatient rehab are modified independent  with PT, modified independent with OT, independent with SLP. 8. Estimated rehab length of stay to reach the above functional goals is: 7-8 days 9. Anticipated discharge destination: Home 10. Overall Rehab/Functional Prognosis: good  RECOMMENDATIONS: This patient's condition is appropriate for continued rehabilitative care in the following setting: CIR Patient has agreed to participate in recommended program. Yes Note that insurance prior  authorization may be required for reimbursement for recommended care.  Comment:  Jason Montes would be a good CIR candidate. He has no family support so goals are modI. He has slow processing but is well oriented and able to answer questions about his history well. He would benefit from SLP eval to assess for higher cognitive deficits to help determine whether he can safely return home modI (I will place order).  Addendum: Thanks to Happi SLP for seeing patient and for her thorough eval: at baseline patient mostly is ambulatory within his room so he is close to baseline function and cognition and may be safely discharged home with home services.   Thank  you for this consult. Admission coordinator to follow.   Leeroy Cha, MD  Izora Ribas, MD 01/31/2020

## 2020-01-31 NOTE — Progress Notes (Signed)
PROGRESS NOTE    Jason Montes  XVQ:[id number] DOB: 05-Jul-1950 DOA: 01/11/2020 PCP: [facility]  Brief Narrative: Jason Montes a 69 y.o.Caucasian malewith medical history significant ofhypertension, hyperlipidemia, diabetes mellitus, stroke, GERD, depression, RLS, PVD, CAD, CKD-3, atrial fibrillation on Eliquis, chronic left greater toe osteomyelitis,sCHF with EF of 40-45%, who presents with altered mental status and overdose ?unintentional.  While in the hospital, patient had a toe amputation for osteomyelitis, received 5 days of antibiotics after treatment.  He also developed aspiration pneumonia, completed antibiotics. Patient has a profound mental status changes, considered Wernicke's syndrome, given the high-dose thiamine.  Condition had improved to baseline. Patient also had a suicidal ideation, has been seen by psychiatry, he is cleared for discharge from psychiatry.  The only active problem is urinary retention.  Patient had 1370 mm of residual.  Attempt to remove Foley catheter after 1 week was without success.  Patient needs followed by urology in the future and keep the Foley catheter for now.     Assessment & Plan:   Principal Problem:   Acute metabolic encephalopathy Active Problems:   Atrial fibrillation (HCC)   CAD (coronary artery disease)   GERD with esophagitis   Chronic systolic CHF (congestive heart failure) (HCC)   Hyperlipidemia   Essential hypertension   Acute renal failure superimposed on stage 3a chronic kidney disease (HCC)   Protein-calorie malnutrition, severe (HCC)   Stage 3a chronic kidney disease   Osteomyelitis of great toe of left foot (HCC)   Type II diabetes mellitus with renal manifestations (Jason Montes)   Stroke (Jason Montes)   Depression   Suicidal behavior   Pressure injury of skin   Acute urinary retention   Hypotension   Nonsustained ventricular tachycardia ([place])  Patient condition has back to baseline.  Only active  problems are urinary retention and generalized weakness. He will need to keep Foley catheter and follow up with urology as outpatient. He continue receiving physical therapy/Occupational Therapy. Due to his criminal record, we were unable to find  SNF to accept him. He will be seen by inpatient rehab today versus tomorrow, may transfer to inpatient rehab if accepted. If not, patient will have to receive physical therapy while in the hospital.  He will be kept until he is appropriate to discharge home.   DVT prophylaxis:Eliquis Code Status:Full Family Communication:None Disposition Plan:  Patient came from:  Anticipated d/c place:?  Barriers to d/c OR conditions which need to be met to effect a safe d/c:   Consultants:  Psychiatry and neurology.  Procedures:Foley Antimicrobials:None    Subjective: Patient doing better today.  Was able to walk about 20 feet with physical therapy.  No short of breath or cough.  No nausea vomiting.  No fever chills.  Objective: Vitals:   01/30/20 0451 01/30/20 1138 01/30/20 2009 01/31/20 0429  BP: 120/60 (!) 115/59 121/62 117/64  Pulse: 79 79 78 82  Resp: _0 Temp: (!) 97.5 F (36.4 C) 97.9 F (36.6 C) (!) 97.5 F (36.4 C) 97.6 F (36.4 C)  TempSrc: Oral Oral Oral Oral  SpO2: 94% 97% 95% 93%  Weight:    74.5 kg  Height:        Intake/Output Summary (Last 24 hours) at 01/31/2020 1454 Last data filed at 01/31/2020 1300 Gross per 24 hour  Intake 720 ml  Output 1000 ml  Net -280 ml   Filed Weights   01/28/20 0442 01/29/20 0426 01/31/20 0429  Weight: 79.7 kg 75.8 kg  74.5 kg    Examination:  General exam: Appears calm and comfortable  Respiratory system: Clear to auscultation. Respiratory effort normal. Cardiovascular system: S1 & S2 heard, RRR. No JVD, murmurs, rubs, gallops or clicks. No  pedal edema. Gastrointestinal system: Abdomen is nondistended, soft and nontender. No organomegaly or masses felt. Normal bowel sounds heard. Central nervous system: Alert and oriented. No focal neurological deficits. Extremities: Symmetric 5 x 5 power. Skin: No rashes, lesions or ulcers Psychiatry: Judgement and insight appear normal. Mood & affect appropriate.     Data Reviewed: I have personally reviewed following labs and imaging studies  CBC: Recent Labs  Lab 01/25/20 0520 01/26/20 0616 01/27/20 0444 01/30/20 0653  WBC 11.0* 9.9 8.8 7.5  HGB 8.8* 9.1* 9.2* 9.6*  HCT 27.3* 27.9* 27.7* 30.4*  MCV 87.5 87.2 87.1 89.9  PLT 214 199 196 322   Basic Metabolic Panel: Recent Labs  Lab 01/25/20 0520 01/26/20 0616 01/27/20 0444 01/30/20 0657  NA 138 135 136  --   K 4.1 4.3 4.6  --   CL 107 104 104  --   CO2 _0 --   GLUCOSE 119* 157* 97  --   BUN _1 --   CREATININE 1.32* 1.42* 1.28* 1.27*  CALCIUM 8.2* 8.6* 8.6*  --   MG 2.5* 2.6* 2.4  --    GFR: Estimated Creatinine Clearance: 56.4 mL/min (A) (by C-G formula based on SCr of 1.27 mg/dL (H)). Liver Function Tests: No results for input(s): AST, ALT, ALKPHOS, BILITOT, PROT, ALBUMIN in the last 168 hours. No results for input(s): LIPASE, AMYLASE in the last 168 hours. No results for input(s): AMMONIA in the last 168 hours. Coagulation Profile: No results for input(s): INR, PROTIME in the last 168 hours. Cardiac Enzymes: No results for input(s): CKTOTAL, CKMB, CKMBINDEX, TROPONINI in the last 168 hours. BNP (last 3 results) No results for input(s): PROBNP in the last 8760 hours. HbA1C: No results for input(s): HGBA1C in the last 72 hours. CBG: Recent Labs  Lab 01/30/20 1328 01/30/20 1637 01/30/20 2101 01/31/20 0803 01/31/20 1151  GLUCAP 306* 257* 225* 210* 229*   Lipid Profile: No results for input(s): CHOL, HDL, LDLCALC, TRIG, CHOLHDL, LDLDIRECT in the last 72 hours. Thyroid Function Tests: No  results for input(s): TSH, T4TOTAL, FREET4, T3FREE, THYROIDAB in the last 72 hours. Anemia Panel: No results for input(s): VITAMINB12, FOLATE, FERRITIN, TIBC, IRON, RETICCTPCT in the last 72 hours. Sepsis Labs: No results for input(s): PROCALCITON, LATICACIDVEN in the last 168 hours.  Recent Results (from the past 240 hour(s))  SARS Coronavirus 2 by RT PCR (hospital order, performed in [facility] hospital lab) Nasopharyngeal Nasopharyngeal Swab     Status: None   Collection Time: 01/27/20 12:00 PM   Specimen: Nasopharyngeal Swab  Result Value Ref Range Status   SARS Coronavirus 2 NEGATIVE NEGATIVE Final    Comment: (NOTE) SARS-CoV-2 target nucleic acids are NOT DETECTED.  The SARS-CoV-2 RNA is generally detectable in upper and lower respiratory specimens during the acute phase of infection. The lowest concentration of SARS-CoV-2 viral copies this assay can detect is 250 copies / mL. A negative result does not preclude SARS-CoV-2 infection and should not be used as the sole basis for treatment or other patient management decisions.  A negative result may occur with improper specimen collection / handling, submission of specimen other than nasopharyngeal swab, presence of viral mutation(s) within the areas targeted by this assay, and inadequate number of viral copies (<  250 copies / mL). A negative result must be combined with clinical observations, patient history, and epidemiological information.  Fact Sheet for Patients:   StrictlyIdeas.no  Fact Sheet for Healthcare Providers: BankingDealers.co.za  This test is not yet approved or  cleared by the [place] FDA and has been authorized for detection and/or diagnosis of SARS-CoV-2 by FDA under an Emergency Use Authorization (EUA).  This EUA will remain in effect (meaning this test can be used) for the duration of the COVID-19 declaration under Section 564(b)(1) of the Act, 21  U.S.C. section 360bbb-3(b)(1), unless the authorization is terminated or revoked sooner.  Performed at [facility], [address], [place], [place] [address]          Radiology Studies: No results found.      Scheduled Meds: . amiodarone  400 mg Oral Daily  . apixaban  5 mg Oral BID  . vitamin C  500 mg Oral BID  . aspirin EC  81 mg Oral Daily  . atorvastatin  40 mg Oral QHS  . Chlorhexidine Gluconate Cloth  6 each Topical Daily  . cholecalciferol  1,000 Units Oral Daily  . clopidogrel  75 mg Oral Daily  . DULoxetine  20 mg Oral Daily  . famotidine  20 mg Oral Daily  . feeding supplement (ENSURE ENLIVE)  237 mL Oral TID BM  . fluticasone  2 spray Each Nare Daily  . gabapentin  200 mg Oral TID  . haloperidol  1 mg Oral BID  . insulin aspart  0-15 Units Subcutaneous TID AC & HS  . insulin aspart  3 Units Subcutaneous TID WC  . [START ON 02/01/2020] insulin glargine  10 Units Subcutaneous Daily  . multivitamin with minerals  1 tablet Oral Daily  . OLANZapine zydis  12.5 mg Oral QHS  . polyethylene glycol  17 g Oral BID  . tamsulosin  0.4 mg Oral Daily  . vitamin B-12  1,000 mcg Oral Daily   Continuous Infusions: . sodium chloride 500 mL (01/27/20 1711)     LOS: 19 days    Time spent: 25 minutes    Sharen Hones, MD [facility]   To contact the attending provider between 7A-7P or the covering provider during after hours 7P-7A, please log into the web site [email or web address] and access using universal [place] password for that web site. If you do not have the password, please call the hospital operator.  01/31/2020, 2:54 PM

## 2020-01-31 NOTE — Progress Notes (Signed)
Inpatient Diabetes Program Recommendations  AACE/ADA: New Consensus Statement on Inpatient Glycemic Control   Target Ranges:  Prepandial:   less than 140 mg/dL      Peak postprandial:   less than 180 mg/dL (1-2 hours)      Critically ill patients:  140 - 180 mg/dL   Results for NKOSI, CORTRIGHT (MRN [id number]) as of 01/31/2020 11:06  Ref. Range 01/30/2020 08:04 01/30/2020 13:28 01/30/2020 16:37 01/30/2020 21:01 01/31/2020 08:03  Glucose-Capillary Latest Ref Range: 70 - 99 mg/dL 212 (H)  Novolog 5 units  Lantus 8 units 306 (H)  Novolog 11 units  257 (H)  Novolog 8 units  225 (H)  Novolog 5 units  210 (H)  Novolog 5 units  Lantus 8 units    Review of Glycemic Control  Diabetes history: DM2 Outpatient Diabetes medications: Jardiance 12.5 mg daily, Metformin 500 mg BID, Ozempic 1 mg Qweek Current orders for Inpatient glycemic control: Lantus 8 units daily, Novolog 0-15 units AC&HS  Inpatient Diabetes Program Recommendations:    Insulin-Please consider increasing Lantus to 10 units daily and adding Novolog 5 units TID with meals for meal coverage if patient eats at least 50% of meals.  Thanks, Barnie Alderman, RN, MSN, CDE Diabetes Coordinator Inpatient Diabetes Program [phone] (Team Pager from 8am to 5pm)

## 2020-01-31 NOTE — Progress Notes (Signed)
Physical Therapy Treatment Patient Details Name: Jason Montes MRN: [id number] DOB: [birth year] Today's Date: 01/31/2020    History of Present Illness Jason Montes is a 49yoM who comes [facility] 8/11 c AMS, overdose. Patientseen by podiatry as he was scheduled for procedure for partial left first ray amputation on 8/13.  Patient did not have cardiology clearance and needed it prior to surgery. Pt underwent Lt hallux amputation on 8/13, revascularization procedureon 8/16. PMH: HTN, HLD, DM, CVA, GERD, depression, RLS, PVD, CAD, CKD-3, AF on Eliquis, chronic Lt hallux osteomyelitis, sCHF with EF of 40-45%    PT Comments    Pt was pleasant and motivated to participate during the session and put forth good effort throughout.  Pt continued to require min A for both bed mobility and transfers along with cues for sequencing.  Pt motivated to attempt to ambulate to nursing station and back to chair but was unable to make it the entire way and required a therapeutic rest break prior to making it to the chair.  Pt required frequent cuing for heel only weight bearing compliance on the LLE with very poor carryover.  Pt will benefit from PT services in an IR setting upon discharge to safely address deficits listed in patient problem list for decreased caregiver assistance and eventual return to PLOF.     Follow Up Recommendations  CIR;Supervision/Assistance - 24 hour     Equipment Recommendations  None recommended by PT    Recommendations for Other Services       Precautions / Restrictions Precautions Precautions: Fall Precaution Comments: Suicide precautions Other Brace: postop shoe Restrictions Weight Bearing Restrictions: Yes RLE Weight Bearing: Weight bearing as tolerated LLE Weight Bearing: Weight bearing as tolerated LLE Partial Weight Bearing Percentage or Pounds: LLE FWB through the heel with post-op shoe donned    Mobility  Bed Mobility Overal bed mobility: Needs  Assistance Bed Mobility: Supine to Sit     Supine to sit: Min assist     General bed mobility comments: Min A to come to full upright sitting position  Transfers Overall transfer level: Needs assistance Equipment used: Rolling walker (2 wheeled) Transfers: Sit to/from Stand Sit to Stand: Min assist;From elevated surface         General transfer comment: Min A to come to full upright standing and to prevent posterior LOB upon initial stand  Ambulation/Gait Ambulation/Gait assistance: Min guard Gait Distance (Feet): 20 Feet Assistive device: Rolling walker (2 wheeled) Gait Pattern/deviations: Trunk flexed;Drifts right/left;Decreased step length - right;Decreased step length - left;Step-to pattern Gait velocity: decreased   General Gait Details: Mod to max verbal cues for step-to sequencing for WB through L heel only; pt fatigued quickly requiring to sit after amb a max of 20 feet   Stairs             Wheelchair Mobility    Modified Rankin (Stroke Patients Only)       Balance Overall balance assessment: Needs assistance Sitting-balance support: No upper extremity supported;Feet supported Sitting balance-Leahy Scale: Fair     Standing balance support: Bilateral upper extremity supported;During functional activity Standing balance-Leahy Scale: Poor Standing balance comment: Min A to prevent posterior LOB upon initial stand                            Cognition Arousal/Alertness: Awake/alert Behavior During Therapy: WFL for tasks assessed/performed Overall Cognitive Status: No family/caregiver present to determine baseline cognitive functioning  Exercises Total Joint Exercises Ankle Circles/Pumps: AROM;Strengthening;Both;15 reps;10 reps Quad Sets: Strengthening;Both;10 reps;15 reps Gluteal Sets: Strengthening;Both;10 reps;15 reps Hip ABduction/ADduction: Strengthening;Both;10 reps Straight  Leg Raises: Strengthening;Both;10 reps Long Arc Quad: Strengthening;Both;15 reps Knee Flexion: Strengthening;Both;15 reps Other Exercises Other Exercises: HEP education/review for BLE APs, QS, and GS x 10 each every 1-2 hours    General Comments        Pertinent Vitals/Pain Pain Assessment: No/denies pain    Home Living                      Prior Function            PT Goals (current goals can now be found in the care plan section) Progress towards PT goals: Progressing toward goals    Frequency    Min 2X/week      PT Plan Current plan remains appropriate    Co-evaluation              AM-PAC PT "6 Clicks" Mobility   Outcome Measure  Help needed turning from your back to your side while in a flat bed without using bedrails?: A Little Help needed moving from lying on your back to sitting on the side of a flat bed without using bedrails?: A Little Help needed moving to and from a bed to a chair (including a wheelchair)?: A Little Help needed standing up from a chair using your arms (e.g., wheelchair or bedside chair)?: A Little Help needed to walk in hospital room?: A Little Help needed climbing 3-5 steps with a railing? : A Lot 6 Click Score: 17    End of Session Equipment Utilized During Treatment: Gait belt Activity Tolerance: Patient tolerated treatment well Patient left: in chair;with call bell/phone within reach;with chair alarm set Nurse Communication: Mobility status PT Visit Diagnosis: Difficulty in walking, not elsewhere classified (R26.2);Unsteadiness on feet (R26.81);Other abnormalities of gait and mobility (R26.89);Muscle weakness (generalized) (M62.81);Other symptoms and signs involving the nervous system (R48.546)     Time: 2703-5009 PT Time Calculation (min) (ACUTE ONLY): 29 min  Charges:  $Gait Training: 8-22 mins $Therapeutic Exercise: 8-22 mins                     D. Scott Lenita Peregrina PT, DPT 01/31/20, 2:39 PM

## 2020-01-31 NOTE — TOC Progression Note (Signed)
Transition of Care [facility]) - Progression Note    Patient Details  Name: Jason Montes MRN: [id number] Date of Birth: 04/21/1951  Transition of Care [facility]) CM/SW [place], [place] Phone Number: [phone] 01/31/2020, 1:25 PM  Clinical Narrative:     CSW spoke with [facility] Admissions Dept [phone], #[phone] , for an update.  Case Manager for this patient is Otilio Saber, he is still reviewing the patient's progress notes and will reach out to this CSW or TOC RN/CM, with an update as soon as possible.       Expected Discharge Plan and Services           Expected Discharge Date: 01/27/20                                     Social Determinants of Health (SDOH) Interventions    Readmission Risk Interventions Readmission Risk Prevention Plan 01/17/2020  Medication Review ([place]) Complete  HRI or Home Care Consult Complete  Palliative Care Screening Not Applicable  Some recent data might be hidden

## 2020-02-01 DIAGNOSIS — Z008 Encounter for other general examination: Secondary | ICD-10-CM

## 2020-02-01 DIAGNOSIS — R4182 Altered mental status, unspecified: Secondary | ICD-10-CM

## 2020-02-01 DIAGNOSIS — R45851 Suicidal ideations: Secondary | ICD-10-CM

## 2020-02-01 LAB — GLUCOSE, CAPILLARY
Glucose-Capillary: 210 mg/dL — ABNORMAL HIGH (ref 70–99)
Glucose-Capillary: 218 mg/dL — ABNORMAL HIGH (ref 70–99)
Glucose-Capillary: 225 mg/dL — ABNORMAL HIGH (ref 70–99)
Glucose-Capillary: 220 mg/dL — ABNORMAL HIGH (ref 70–99)
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
## 2020-02-01 NOTE — Progress Notes (Signed)
PROGRESS NOTE    Jason Montes  TFT:[id number] DOB: [birth year] DOA: 01/11/2020 PCP: [facility]  Brief Narrative: Jason Cornelio [name] a 69 y.o.Caucasian malewith medical history significant ofhypertension, hyperlipidemia, diabetes mellitus, stroke, GERD, depression, RLS, PVD, CAD, CKD-3, atrial fibrillation on Eliquis, chronic left greater toe osteomyelitis,sCHF with EF of 40-45%, who presents with altered mental status and overdose ?unintentional.  While in the hospital, patient had a toe amputation for osteomyelitis, received 5 days of antibiotics after treatment.  He also developed aspiration pneumonia, completed antibiotics. Patient has a profound mental status changes, considered Wernicke's syndrome, given the high-dose thiamine.  Condition had improved to baseline. Patient also had a suicidal ideation, has been seen by psychiatry, he is cleared for discharge from psychiatry.  The only active problem is urinary retention.  Patient had 1370 mm of residual.  Attempt to remove Foley catheter after 1 week was without success.  Patient needs followed by urology in the future and keep the Foley catheter for now.     Assessment & Plan:   Principal Problem:   Acute metabolic encephalopathy Active Problems:   Atrial fibrillation (HCC)   CAD (coronary artery disease)   GERD with esophagitis   Chronic systolic CHF (congestive heart failure) (HCC)   Hyperlipidemia   Essential hypertension   Acute renal failure superimposed on stage 3a chronic kidney disease (HCC)   Protein-calorie malnutrition, severe (HCC)   Stage 3a chronic kidney disease   Osteomyelitis of great toe of left foot (HCC)   Type II diabetes mellitus with renal manifestations (HCC)   Stroke ([name])   Depression   Suicidal behavior   Pressure injury of skin   Acute urinary retention   Hypotension   Nonsustained ventricular tachycardia (HCC)   Aspiration pneumonia of both lower lobes due to  gastric secretions ([place])   Wernicke encephalopathy   Encounter for psychological evaluation  Patient condition has back to baseline.  Only active problems are urinary retention and generalized weakness. He will need to keep Foley catheter and follow up with urology as outpatient. He continue receiving physical therapy/Occupational Therapy.  Psych consulted today to determine pt's decision-making capacity.   DVT prophylaxis: HC:WCBJSEG Code Status: DNR  Family Communication:  Status is: inpatient Dispo:   The patient is from: boarding house Anticipated d/c is to: boarding house Anticipated d/c date is: undetermined Patient currently is not medically stable to d/c due to: not safe to discharge home.  Due to his criminal record, we were unable to find  SNF to accept him.  CIR has declined him.  May have to receive PT while inpatient until he is deemed safe to return home.   Consultants:  Psychiatry and neurology.  Procedures:Foley Antimicrobials:None    Subjective: Pt reported severe dizziness while up and walking with PT today, and BP found to drop to 80's from 120's.  Pt reported he has been eating/drinking well.     Objective: Vitals:   01/31/20 0429 01/31/20 2132 02/01/20 0525 02/01/20 1126  BP: 117/64 (!) 119/59 (!) 107/46 (!) 113/59  Pulse: 82 82 75 75  Resp: 18 20 20 18   Temp: 97.6 F (36.4 C) 97.7 F (36.5 C) 97.8 F (36.6 C) (!) 97.5 F (36.4 C)  TempSrc: Oral Oral Oral Oral  SpO2: 93% 95% 96% 97%  Weight: 74.5 kg     Height:        Intake/Output Summary (Last 24 hours) at 02/01/2020 1618 Last data filed at 02/01/2020 0925 Gross per 24 hour  Intake --  Output 1900 ml  Net -1900 ml   Filed Weights   01/28/20 0442 01/29/20 0426 01/31/20 0429  Weight: 79.7 kg 75.8 kg 74.5 kg    Examination:  Constitutional: NAD, alert, coherent HEENT: conjunctivae and lids normal, EOMI CV: RRR no M,R,G. Distal pulses +2.  No cyanosis.   RESP: CTA B/L, normal  respiratory effort  GI: +BS, NTND Extremities: Left foot in wraps.  Right toes all amputated. SKIN: warm, dry and intact Neuro: II - XII grossly intact.     Data Reviewed: I have personally reviewed following labs and imaging studies  CBC: Recent Labs  Lab 01/26/20 0616 01/27/20 0444 01/30/20 0653  WBC 9.9 8.8 7.5  HGB 9.1* 9.2* 9.6*  HCT 27.9* 27.7* 30.4*  MCV 87.2 87.1 89.9  PLT 199 196 397   Basic Metabolic Panel: Recent Labs  Lab 01/26/20 0616 01/27/20 0444 01/30/20 0657  NA 135 136  --   K 4.3 4.6  --   CL 104 104  --   CO2 24 26  --   GLUCOSE 157* 97  --   BUN 18 21  --   CREATININE 1.42* 1.28* 1.27*  CALCIUM 8.6* 8.6*  --   MG 2.6* 2.4  --    GFR: Estimated Creatinine Clearance: 56.4 mL/min (A) (by C-G formula based on SCr of 1.27 mg/dL (H)). Liver Function Tests: No results for input(s): AST, ALT, ALKPHOS, BILITOT, PROT, ALBUMIN in the last 168 hours. No results for input(s): LIPASE, AMYLASE in the last 168 hours. No results for input(s): AMMONIA in the last 168 hours. Coagulation Profile: No results for input(s): INR, PROTIME in the last 168 hours. Cardiac Enzymes: No results for input(s): CKTOTAL, CKMB, CKMBINDEX, TROPONINI in the last 168 hours. BNP (last 3 results) No results for input(s): PROBNP in the last 8760 hours. HbA1C: No results for input(s): HGBA1C in the last 72 hours. CBG: Recent Labs  Lab 01/31/20 1151 01/31/20 1633 01/31/20 2132 02/01/20 0809 02/01/20 1126  GLUCAP 229* 311* 213* 210* 218*   Lipid Profile: No results for input(s): CHOL, HDL, LDLCALC, TRIG, CHOLHDL, LDLDIRECT in the last 72 hours. Thyroid Function Tests: No results for input(s): TSH, T4TOTAL, FREET4, T3FREE, THYROIDAB in the last 72 hours. Anemia Panel: No results for input(s): VITAMINB12, FOLATE, FERRITIN, TIBC, IRON, RETICCTPCT in the last 72 hours. Sepsis Labs: No results for input(s): PROCALCITON, LATICACIDVEN in the last 168 hours.  Recent Results  (from the past 240 hour(s))  SARS Coronavirus 2 by RT PCR (hospital order, performed in [facility] hospital lab) Nasopharyngeal Nasopharyngeal Swab     Status: None   Collection Time: 01/27/20 12:00 PM   Specimen: Nasopharyngeal Swab  Result Value Ref Range Status   SARS Coronavirus 2 NEGATIVE NEGATIVE Final    Comment: (NOTE) SARS-CoV-2 target nucleic acids are NOT DETECTED.  The SARS-CoV-2 RNA is generally detectable in upper and lower respiratory specimens during the acute phase of infection. The lowest concentration of SARS-CoV-2 viral copies this assay can detect is 250 copies / mL. A negative result does not preclude SARS-CoV-2 infection and should not be used as the sole basis for treatment or other patient management decisions.  A negative result may occur with improper specimen collection / handling, submission of specimen other than nasopharyngeal swab, presence of viral mutation(s) within the areas targeted by this assay, and inadequate number of viral copies (<250 copies / mL). A negative result must be combined with clinical observations, patient history, and epidemiological information.  Fact Sheet for Patients:   StrictlyIdeas.no  Fact Sheet for Healthcare Providers: BankingDealers.co.za  This test is not yet approved or  cleared by the [place] FDA and has been authorized for detection and/or diagnosis of SARS-CoV-2 by FDA under an Emergency Use Authorization (EUA).  This EUA will remain in effect (meaning this test can be used) for the duration of the COVID-19 declaration under Section 564(b)(1) of the Act, 21 U.S.C. section 360bbb-3(b)(1), unless the authorization is terminated or revoked sooner.  Performed at [facility], [address] [place], [place] [address]          Radiology Studies: No results found.      Scheduled Meds: . amiodarone  400 mg Oral Daily  . apixaban  5 mg Oral  BID  . vitamin C  500 mg Oral BID  . aspirin EC  81 mg Oral Daily  . atorvastatin  40 mg Oral QHS  . Chlorhexidine Gluconate Cloth  6 each Topical Daily  . cholecalciferol  1,000 Units Oral Daily  . clopidogrel  75 mg Oral Daily  . DULoxetine  20 mg Oral Daily  . famotidine  20 mg Oral Daily  . feeding supplement (ENSURE ENLIVE)  237 mL Oral TID BM  . fluticasone  2 spray Each Nare Daily  . gabapentin  200 mg Oral TID  . haloperidol  1 mg Oral BID  . insulin aspart  0-15 Units Subcutaneous TID AC & HS  . insulin aspart  3 Units Subcutaneous TID WC  . insulin glargine  10 Units Subcutaneous Daily  . multivitamin with minerals  1 tablet Oral Daily  . OLANZapine zydis  12.5 mg Oral QHS  . polyethylene glycol  17 g Oral BID  . tamsulosin  0.4 mg Oral Daily  . vitamin B-12  1,000 mcg Oral Daily   Continuous Infusions: . sodium chloride 500 mL (01/27/20 1711)     LOS: 20 days     Enzo Bi, MD [facility]   To contact the attending provider between 7A-7P or the covering provider during after hours 7P-7A, please log into the web site [email or web address] and access using universal [place] password for that web site. If you do not have the password, please call the hospital operator.  02/01/2020, 4:18 PM

## 2020-02-01 NOTE — Evaluation (Addendum)
Speech Language Pathology Evaluation Patient Details Name: Jason Montes MRN: [id number] DOB: 1951/04/15 Today's Date: 02/01/2020 Time: 4970-2637 SLP Time Calculation (min) (ACUTE ONLY): 41 min  Problem List:  Patient Active Problem List   Diagnosis Date Noted  . Aspiration pneumonia of both lower lobes due to gastric secretions ([place]) 01/31/2020  . Wernicke encephalopathy 01/31/2020  . Acute urinary retention 01/28/2020  . Hypotension 01/28/2020  . Nonsustained ventricular tachycardia ([place]) 01/28/2020  . Pressure injury of skin 01/20/2020  . Acute metabolic encephalopathy 85/88/5027  . Osteomyelitis of great toe of left foot ([place]) 01/11/2020  . Type II diabetes mellitus with renal manifestations ([place]) 01/11/2020  . Stroke ([place]) 01/11/2020  . Depression 01/11/2020  . Suicidal behavior 01/11/2020  . Subacute osteomyelitis of left foot ([place])   . Intertrochanteric fracture of right femur, closed, initial encounter ([place]) 11/15/2019  . Accidental fall 11/15/2019  . Preoperative clearance 11/15/2019  . Closed right hip fracture, initial encounter ([place]) 11/15/2019  . Diabetic foot infection ([place])   . Stage 3a chronic kidney disease   . Cellulitis of left foot 10/07/2019  . Protein-calorie malnutrition, severe ([place]) 03/13/2019  . Acute renal failure superimposed on stage 3a chronic kidney disease ([place]) 03/11/2019  . Family history of malignant neoplasm of gastrointestinal tract   . Benign neoplasm of transverse colon   . Benign neoplasm of cecum   . Benign neoplasm of ascending colon   . Benign neoplasm of descending colon   . Polyp of sigmoid colon   . Atrial fibrillation ([place]) 02/25/2018  . Vitamin B12 deficiency anemia 02/25/2018  . CKD (chronic kidney disease) stage 2, GFR 60-89 ml/min 02/25/2018  . CAD (coronary artery disease) 02/25/2018  . DM (diabetes mellitus), type 2, uncontrolled with complications ([place]) 74/05/8785  . GERD with esophagitis 02/25/2018  . Generalized  muscle weakness 02/25/2018  . Chronic systolic CHF (congestive heart failure) ([place]) 02/25/2018  . Chronic anticoagulation 02/25/2018  . 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 Hyperlipidemia 02/25/2018  . Essential hypertension 02/25/2018  . Hypokalemia 02/25/2018  . Mild neurocognitive disorder 02/25/2018  . Osteoarthritis 02/25/2018  . PVD (peripheral vascular disease) ([place]) 02/25/2018  . Presbyopia 02/25/2018  . Restless leg syndrome 02/25/2018  . History of stroke with current residual effects 02/25/2018  . Subacute osteomyelitis of right foot ([place]) 02/25/2018  . Tinea unguium 02/25/2018   Past Medical History:  Past Medical History:  Diagnosis Date  . Allergy   . Atrial fibrillation ([place])   . B12 deficiency   . Chronic kidney disease   . Chronic kidney disease (CKD), stage II (mild)   . Coronary atherosclerosis   . Diabetes mellitus without complication ([place])   . Elevated PSA   . Esophagitis   . GERD (gastroesophageal reflux disease)   . Heart attack ([place])   . Heart disease   . Hyperlipidemia   . Hypertension   . Hypokalemia   . Mild neurocognitive disorder   . Osteoarthritis   . Presbyopia   . PVD (peripheral vascular disease) ([place])   . Restless leg syndrome   . Stroke (cerebrum) ([place])   . Stroke ([place])   . Subacute osteomyelitis ([place])   . Tinea unguium   . Uncompensated short term memory deficit    Past Surgical History:  Past Surgical History:  Procedure Laterality Date  . AMPUTATION TOE Left 01/13/2020   Procedure: AMPUTATION RAY LEFT 1ST;  Surgeon: Caroline More, DPM;  Location: [facility] ORS;  Service: Podiatry;  Laterality: Left;  . CARDIAC PACEMAKER  PLACEMENT    . COLONOSCOPY WITH PROPOFOL N/A 05/18/2018   Procedure: COLONOSCOPY WITH PROPOFOL;  Surgeon: [name], MD;  Location: [facility] ENDOSCOPY;  Service: Endoscopy;  Laterality: N/A;  . CORONARY ARTERY BYPASS GRAFT    . FOOT AMPUTATION Right   . INTRAMEDULLARY (IM) NAIL INTERTROCHANTERIC Right  11/16/2019   Procedure: INTRAMEDULLARY (IM) NAIL INTERTROCHANTRIC;  Surgeon: Thornton Park, MD;  Location: [facility] ORS;  Service: Orthopedics;  Laterality: Right;  . LOWER EXTREMITY ANGIOGRAPHY Left 10/10/2019   Procedure: Lower Extremity Angiography;  Surgeon: Algernon Huxley, MD;  Location: [facility];  Service: Cardiovascular;  Laterality: Left;  . LOWER EXTREMITY ANGIOGRAPHY Left 01/16/2020   Procedure: Lower Extremity Angiography;  Surgeon: Algernon Huxley, MD;  Location: [facility];  Service: Cardiovascular;  Laterality: Left;   HPI:  Jason Montes is a 69 y.o. male who was admitted to [facility] with acute metabolic encephalopathy. He has a history of HTN, HLD, AB, stroke, GERD, depression, RLS, PVD, CAD, CKD3, atrial fibrillation on Eliquis, and chronic left greater toe osteomyelitis, CHF, who presented after unintentional overdose and underwent a toe amputation for his osteomyelitis and was treated for pneumonia. Yesterday he was able to walk to the nursing station and back and he felt well. MinA. He would like to be able to do this more frequently. Denies pain. Does not have any suicidal ideation or depression anymore. Denies confusion.   Assessment / Plan / Recommendation Clinical Impression  Pt's mentation has greatly improved over the course of his hospitalization. He is awake, socially appropriate, conversant and eager to participate.   At baseline, he reports that he lives in a large house where he rents a room. There is a shared bathroom and kitchen that all renters on the first floor use. He describes his day as follows: He gets up and Amy (a paid friend who rents a unit within the house) comes over, fixes him breakfast, sets it up for him, watches TV until Amy comes back, fixes lunch, sets him up and this repeats throughout the day. He does not drive, manage his own money, medication, clean his house, laundry or fixing meals. His daughter and Amy manage these areas of pt's  life. He reports that since his stroke he "doesn't have the memory to keep up with things." Of note, pt was initially admitted to [facility] after an unintentional overdose, which could possibly be related to memory deficits. At baseline, pt is able to dress and bathe himself.   Currently he is not demonstrating any acute cognitive deficits. This is further supported by pt's ability on the COGNISTAT. He scored within the average range on orientation, attention, language naming, calculations, reasoning and judgement. He scored within the severe range for memory. With maximal assistance pt was able to immediately recall information but despite maximal assistance he was not able to demonstrate any ability with delayed recall (5 minute delay).   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 At this time, skilled ST intervention is not appropriate.  Of note pt requests HHPT as he reports good success with HH after his stroke.     SLP Assessment  SLP Recommendation/Assessment: Patient does not need any further Speech Lanaguage Pathology Services SLP Visit Diagnosis: Cognitive communication deficit (R41.841)    Follow Up Recommendations  None          SLP Evaluation Cognition  Overall Cognitive  Status: Within Functional Limits for tasks assessed Arousal/Alertness: Awake/alert Orientation Level: Oriented to person;Oriented to place;Oriented to situation (at baseline, he didn't keep up with DOW, oriented to year) Attention: Selective Selective Attention: Appears intact Memory: Appears intact Awareness: Appears intact Problem Solving: Appears intact Safety/Judgment: Appears intact       Comprehension  Auditory Comprehension Overall Auditory Comprehension: Appears within functional limits for tasks assessed Visual Recognition/Discrimination Discrimination: Within Function  Limits Reading Comprehension Reading Status: Not tested    Expression Expression Primary Mode of Expression: Verbal Verbal Expression Overall Verbal Expression: Appears within functional limits for tasks assessed Written Expression Dominant Hand:  (prior to his stroke (several years ago) pt was right handed,) Written Expression: Not tested   Oral / Motor  Oral Motor/Sensory Function Overall Oral Motor/Sensory Function: Within functional limits Motor Speech Overall Motor Speech: Appears within functional limits for tasks assessed   GO                   Denny Lave B. Rutherford Nail M.S., CCC-SLP, [place] Office [phone]  Stormy Fabian 02/01/2020, 12:37 PM

## 2020-02-01 NOTE — Progress Notes (Signed)
Physical Therapy Treatment Patient Details Name: Jason Montes MRN: [id number] DOB: [birth year] Today's Date: 02/01/2020    History of Present Illness Edson Deridder is a 51yoM who comes to [facility] 8/11 c AMS, overdose. Patientseen by podiatry as he was scheduled for procedure for partial left first ray amputation on 8/13.  Patient did not have cardiology clearance and needed it prior to surgery. Pt underwent Lt hallux amputation on 8/13, revascularization procedureon 8/16. PMH: HTN, HLD, DM, CVA, GERD, depression, RLS, PVD, CAD, CKD-3, AF on Eliquis, chronic Lt hallux osteomyelitis, sCHF with EF of 40-45%    PT Comments    Pt was pleasant and motivated to participate during the session.  Pt required min A with bed mobility tasks and transfers.  Pt provided extensive verbal and visual demonstration/education on proper sequencing during amb for WB status compliance.  Once in standing pt only able to amb 2 feet x 1 and then 5 feet x 1 before c/o significant dizziness/lightheadedness.  Pt returned to sitting with BP taken at 124/67.  Pt remained in sitting and participated in seated therex reporting quick resolution of symptoms.  Pt then returned to standing with BP taken at 80/58 with symptoms returning and with pt unable to remain standing for entirety of BP reading.  Pt returned to supine with BP taken again at 129/69.  MD entered room and was made aware as was nursing.  Pt will benefit from PT services in an IR setting upon discharge to safely address deficits listed in patient problem list for decreased caregiver assistance and eventual return to PLOF.     Follow Up Recommendations  CIR;Supervision/Assistance - 24 hour     Equipment Recommendations  3in1 (PT)    Recommendations for Other Services       Precautions / Restrictions Precautions Precautions: Fall Precaution Comments: Suicide precautions Required Braces or Orthoses: Other Brace Other Brace: postop  shoe Restrictions Weight Bearing Restrictions: Yes RLE Weight Bearing: Weight bearing as tolerated LLE Weight Bearing: Weight bearing as tolerated LLE Partial Weight Bearing Percentage or Pounds: FWB through the L heel only with post op shoe donned    Mobility  Bed Mobility Overal bed mobility: Needs Assistance Bed Mobility: Supine to Sit;Sit to Supine     Supine to sit: Modified independent (Device/Increase time) Sit to supine: Min assist   General bed mobility comments: Min A for final positioning during sit to sup; extra time and effort with sup to sit  Transfers Overall transfer level: Needs assistance Equipment used: Rolling walker (2 wheeled) Transfers: Sit to/from Stand Sit to Stand: Min assist;From elevated surface         General transfer comment: Min A to come to full upright standing and to prevent posterior LOB upon initial stand  Ambulation/Gait Ambulation/Gait assistance: Min guard Gait Distance (Feet): 5 Feet x 1, 2 Feet x 1 Assistive device: Rolling walker (2 wheeled) Gait Pattern/deviations: Trunk flexed;Drifts right/left;Decreased step length - right;Decreased step length - left;Step-to pattern Gait velocity: decreased   General Gait Details: Mod to max verbal and visual cues/demonstration for step-to sequencing for WB through L heel only; pt c/o dizziness after limited amb and returned to sitting, see comments for orthostatic BP's   Stairs             Wheelchair Mobility    Modified Rankin (Stroke Patients Only)       Balance Overall balance assessment: Needs assistance Sitting-balance support: No upper extremity supported;Feet supported Sitting balance-Leahy Scale: Fair  Standing balance support: Bilateral upper extremity supported;During functional activity Standing balance-Leahy Scale: Poor Standing balance comment: Min A to prevent posterior LOB upon initial stand                            Cognition  Arousal/Alertness: Awake/alert Behavior During Therapy: WFL for tasks assessed/performed Overall Cognitive Status: Within Functional Limits for tasks assessed                                        Exercises Total Joint Exercises Ankle Circles/Pumps: AROM;Strengthening;Both;15 reps;10 reps Quad Sets: Strengthening;Both;10 reps;15 reps Gluteal Sets: Strengthening;Both;10 reps;15 reps Towel Squeeze: Strengthening;Both;10 reps Hip ABduction/ADduction: Strengthening;Both;10 reps;5 reps Straight Leg Raises: Strengthening;Both;10 reps;5 reps Long Arc Quad: Strengthening;Both;15 reps;10 reps Knee Flexion: Strengthening;Both;15 reps;10 reps Marching in Standing: Strengthening;Both;10 reps;Seated Other Exercises Other Exercises: HEP education/review for BLE APs, QS, and GS x 10 each every 1-2 hours Other Exercises: Verbal education and visual demonstration for WB status compliance    General Comments        Pertinent Vitals/Pain Pain Assessment: 0-10 Pain Score: 2  Pain Location: L heel, MD in room and aware, heels floated at end of session Pain Descriptors / Indicators: Sore Pain Intervention(s): Monitored during session;Repositioned    Home Living     Available Help at Discharge: Personal care attendant;Family Type of Home: House              Prior Function            PT Goals (current goals can now be found in the care plan section) Progress towards PT goals: Not progressing toward goals - comment (limited by orthostatic BP)    Frequency    Min 2X/week      PT Plan Current plan remains appropriate    Co-evaluation              AM-PAC PT "6 Clicks" Mobility   Outcome Measure  Help needed turning from your back to your side while in a flat bed without using bedrails?: A Little Help needed moving from lying on your back to sitting on the side of a flat bed without using bedrails?: A Little Help needed moving to and from a bed to a chair  (including a wheelchair)?: A Little Help needed standing up from a chair using your arms (e.g., wheelchair or bedside chair)?: A Little Help needed to walk in hospital room?: A Little Help needed climbing 3-5 steps with a railing? : A Lot 6 Click Score: 17    End of Session Equipment Utilized During Treatment: Gait belt Activity Tolerance: Treatment limited secondary to medical complications (Comment) (orthostatic during ambulation) Patient left: in bed;with nursing/sitter in room;with call bell/phone within reach;with bed alarm set Nurse Communication: Mobility status;Other (comment) (Pt's orthostatic BP results) PT Visit Diagnosis: Difficulty in walking, not elsewhere classified (R26.2);Unsteadiness on feet (R26.81);Other abnormalities of gait and mobility (R26.89);Muscle weakness (generalized) (M62.81);Other symptoms and signs involving the nervous system (F81.829)     Time: 9371-6967 PT Time Calculation (min) (ACUTE ONLY): 38 min  Charges:  $Gait Training: 8-22 mins $Therapeutic Exercise: 8-22 mins $Therapeutic Activity: 8-22 mins                     D. Scott Loki Wuthrich PT, DPT 02/01/20, 4:02 PM

## 2020-02-01 NOTE — TOC Progression Note (Addendum)
Transition of Care [facility]) - Progression Note    Patient Details  Name: Jason Montes MRN: [id number] Date of Birth: [birth year]  Transition of Care [facility] CM/SW [place], [place] Phone Number: [phone] 02/01/2020, 2:08 PM  Clinical Narrative:     CSW received a voicemail from Otilio Saber from [facility] [phone], stating they would not be able to accept the patient due to his "mental status".  CSW spoke with Colletta Maryland RN/CM to update.        Expected Discharge Plan and Services           Expected Discharge Date: 01/27/20                                     Social Determinants of Health (SDOH) Interventions    Readmission Risk Interventions Readmission Risk Prevention Plan 01/17/2020  Medication Review ([place]) Complete  HRI or Home Care Consult Complete  Palliative Care Screening Not Applicable  Some recent data might be hidden

## 2020-02-01 NOTE — Progress Notes (Signed)
Inpatient Rehabilitation Admissions Coordinator  Discussed case with Dr. Ranell Patrick. Patient unlikely to need CIR level of rehab at this time. Recommend further therapy there at [facility] and then discharge home with [facility] as he prefers. Discussed with SLP, Happi and Colletta [place], TOC RN CM.  Danne Baxter, RN, MSN Rehab Admissions Coordinator [phone] 02/01/2020 10:18 AM

## 2020-02-01 NOTE — Progress Notes (Signed)
Inpatient Diabetes Program Recommendations  AACE/ADA: New Consensus Statement on Inpatient Glycemic Control   Target Ranges:  Prepandial:   less than 140 mg/dL      Peak postprandial:   less than 180 mg/dL (1-2 hours)      Critically ill patients:  140 - 180 mg/dL   Results for Jason Montes, Jason Montes (MRN [id number]) as of 02/01/2020 12:59  Ref. Range 01/31/2020 08:03 01/31/2020 11:51 01/31/2020 16:33 01/31/2020 21:32 02/01/2020 08:09 02/01/2020 11:26  Glucose-Capillary Latest Ref Range: 70 - 99 mg/dL 210 (H) 229 (H) 311 (H) 213 (H) 210 (H) 218 (H)   Review of Glycemic Control  Diabetes history:DM2 Outpatient Diabetes medications:Jardiance 12.5 mg daily, Metformin 500 mg BID, Ozempic 1 mg Qweek Current orders for Inpatient glycemic control:Lantus 10 units daily, Novolog 0-15 units AC&HS, Novolog 3 units TID with meals  Inpatient Diabetes Program Recommendations:  Insulin-Please consider increasing meal coverage to Novolog 6 units TID with meals if patient eats at least 50% of meals.  Thanks, Barnie Alderman, RN, MSN, CDE Diabetes Coordinator Inpatient Diabetes Program [phone] (Team Pager from 8am to 5pm)

## 2020-02-02 LAB — BASIC METABOLIC PANEL
Anion gap: 6 (ref 5–15)
BUN: 31 mg/dL — ABNORMAL HIGH (ref 8–23)
CO2: 30 mmol/L (ref 22–32)
Calcium: 9.4 mg/dL (ref 8.9–10.3)
Chloride: 95 mmol/L — ABNORMAL LOW (ref 98–111)
Creatinine, Ser: 1.38 mg/dL — ABNORMAL HIGH (ref 0.61–1.24)
GFR calc Af Amer: >60 mL/min (ref >60)
GFR calc non Af Amer: 53 mL/min — ABNORMAL LOW (ref >60)
Glucose, Bld: 197 mg/dL — ABNORMAL HIGH (ref 70–99)
Potassium: 5.6 mmol/L — ABNORMAL HIGH (ref 3.5–5.1)
Sodium: 131 mmol/L — ABNORMAL LOW (ref 135–145)

## 2020-02-02 LAB — CBC
HCT: 26.5 % — ABNORMAL LOW (ref 39.0–52.0)
Hemoglobin: 8.9 g/dL — ABNORMAL LOW (ref 13.0–17.0)
MCH: 28.3 pg (ref 26.0–34.0)
MCHC: 33.6 g/dL (ref 30.0–36.0)
MCV: 84.4 fL (ref 80.0–100.0)
Platelets: 279 10*3/uL (ref 150–400)
RBC: 3.14 MIL/uL — ABNORMAL LOW (ref 4.22–5.81)
RDW: 16 % — ABNORMAL HIGH (ref 11.5–15.5)
WBC: 9.9 10*3/uL (ref 4.0–10.5)
nRBC: 0 % (ref 0.0–0.2)

## 2020-02-02 LAB — GLUCOSE, CAPILLARY
Glucose-Capillary: 175 mg/dL — ABNORMAL HIGH (ref 70–99)
Glucose-Capillary: 271 mg/dL — ABNORMAL HIGH (ref 70–99)
Glucose-Capillary: 270 mg/dL — ABNORMAL HIGH (ref 70–99)
Glucose-Capillary: 202 mg/dL — ABNORMAL HIGH (ref 70–99)

## 2020-02-02 LAB — MAGNESIUM: Magnesium: 2.3 mg/dL (ref 1.7–2.4)

## 2020-02-02 MED ORDER — SODIUM ZIRCONIUM CYCLOSILICATE 5 G PO PACK
5.0000 g | PACK | Freq: Two times a day (BID) | ORAL | Status: DC
  Administered 2020-02-02 – 2020-02-03 (×3): 5 g via ORAL
  Filled 2020-02-02 (×5): qty 1

## 2020-02-02 MED ORDER — NEPRO/CARBSTEADY PO LIQD
237.0000 mL | Freq: Two times a day (BID) | ORAL | Status: DC
  Administered 2020-02-02 – 2020-02-03 (×2): 237 mL via ORAL

## 2020-02-02 MED ORDER — INSULIN ASPART 100 UNIT/ML ~~LOC~~ SOLN
6.0000 [IU] | Freq: Three times a day (TID) | SUBCUTANEOUS | Status: DC
  Administered 2020-02-02 – 2020-02-03 (×3): 6 [IU] via SUBCUTANEOUS
  Filled 2020-02-02 (×3): qty 1

## 2020-02-02 NOTE — Progress Notes (Signed)
Inpatient Rehabilitation Admissions Coordinator  Discussed case with Dr. Naaman Plummer and then contacted patient by phone. Patient and I discussed goals and expectations of an inpt rehab admit. ELOS 7 to 10 days with Mod I goals. Patient is in agreement. I contacted TOC RN CM, Stephanie. I am hopeful for CIR bed available Friday 9/3. I have left message with pt's Son in law by voicemail to have [facility] call me to discuss CIR venue. Heather's voicemail is full. I will follow up tomorrow morning to clarify if bed will be available for Cir admit 9/3.  Danne Baxter, RN, MSN Rehab Admissions Coordinator [phone] 02/02/2020 2:20 PM

## 2020-02-02 NOTE — PMR Pre-admission (Signed)
PMR Admission Coordinator Pre-Admission Assessment  Patient: Jason Montes is an 69 y.o., male MRN: [id number] DOB: 02/09/1951 Height: 5\' 9"  (175.3 cm) Weight: 71.9 kg              Insurance Information HMO:     PPO:      PCP:      IPA:      80/20:      OTHER:  PRIMARY: Medicare  A and b      Policy#: [id number]      Subscriber: pt Benefits:  Phone #: passport one online     Name: 9/2 Eff. Date: a 11/30/2017 and b 10/1 2019     Deduct: $1484      Out of Pocket Max: none      Life Max: none  CIR: 100%      SNF:  Outpatient: 80%     Co-Pay: 20% Home Health: 100%      Co-Pay: none DME: 80%     Co-Pay: 205 Providers: pt choice  SECONDARY: Medicaid [place] access      Policy#: [id number]      Phone#: pt  Development worker, community:       Phone#:   The Therapist, art Information Summary" for patients in Inpatient Rehabilitation Facilities with attached "Privacy Act [place] Records" was provided and verbally reviewed with: Patient and Family  Emergency Contact Information Contact Information    Name Relation Home Work Milton, Nira Conn Daughter   Beattie, Daisetta Relative   [phone]     Current Medical History  Patient Admitting Diagnosis: Debility  History of Present Illness:  69 year old right-handed male with history of hypertension, hyperlipidemia, atrial fibrillation maintained on Eliquis, CVA, diabetes mellitus, CKD stage III, CAD with cardiac arrest/pacemaker 2019 and resultant hypoxia with cognitive deficits  chronic left greater toe osteomyelitis with peripheral vascular disease, systolic congestive heart failure with ejection fraction of 40 to 45%.   Presented 01/11/2020 with altered mental status.  There was some question of unintentional drug overdose.  Cranial CT scan negative for acute process.  Sequela of remote left occipital and left cerebellar insults.  Chest x-ray mild streaky bibasilar opacities favoring atelectasis.  EEG  negative for seizure.  Urine drug screen positive tricyclic, sodium 253, sedimentation rate 83 glucose 115, BUN 34, creatinine 1.59, total bilirubin 1.4, hemoglobin 12, lactic acid 1.2, BNP 316.  Most recent echocardiogram ejection fraction of 40 to 45% no wall motion abnormalities.  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 Caroline More after cardiac clearance was obtained as well as later undergoing arteriography/aortogram of left lower extremity per vascular surgery Dr. Leotis Pain showing relatively normal common femoral artery profunda femoris artery and superficial femoral artery.  The posterior tibial artery was chronically occluded with no distal reconstitution.Marland Kitchen  Hospital course psychiatry service was consulted for possible drug overdose as well as capacity evaluation and patient was deemed independent to make medical decisions and has been maintained on Haldol 1 mg twice daily as well as Zyprexa via 12.5 mg nightly.  Patient with bouts of urinary retention and initial voiding trial failed with Foley catheter tube remaining in place therapy evaluations completed patient is full weightbearing through left heel only after recent amputation with postoperative shoe.   Past Medical History  Past Medical History:  Diagnosis Date  . Allergy   . Atrial fibrillation ([place])   . B12 deficiency   . Chronic kidney disease   .  Chronic kidney disease (CKD), stage II (mild)   . Coronary atherosclerosis   . Diabetes mellitus without complication ([place])   . Elevated PSA   . Esophagitis   . GERD (gastroesophageal reflux disease)   . Heart attack ([place])   . Heart disease   . Hyperlipidemia   . Hypertension   . Hypokalemia   . Mild neurocognitive disorder   . Osteoarthritis   . Presbyopia   . PVD (peripheral vascular disease) ([place])   . Restless leg syndrome   . Stroke (cerebrum) ([place])   . Stroke ([place])   . Subacute osteomyelitis ([place])    . Tinea unguium   . Uncompensated short term memory deficit     Family History  family history includes Colon cancer in his mother; Diabetes in his mother; Heart failure in his mother; Thyroid disease in his sister.  Prior Rehab/Hospitalizations:  Has the patient had prior rehab or hospitalizations prior to admission? Yes  Has the patient had major surgery during 100 days prior to admission? Yes  Current Medications   Current Facility-Administered Medications:  .  0.9 %  sodium chloride infusion, , Intravenous, PRN, Algernon Huxley, MD, Last Rate: 10 mL/hr at 01/27/20 1711, 500 mL at 01/27/20 1711 .  acetaminophen (TYLENOL) tablet 650 mg, 650 mg, Oral, Q6H PRN, 650 mg at 01/18/20 1211 **OR** acetaminophen (TYLENOL) suppository 650 mg, 650 mg, Rectal, Q6H PRN, Lucky Cowboy, Erskine Squibb, MD .  amiodarone (PACERONE) tablet 400 mg, 400 mg, Oral, Daily, Neoma Laming A, MD, 400 mg at 02/03/20 0810 .  apixaban (ELIQUIS) tablet 5 mg, 5 mg, Oral, BID, Dallie Piles, RPH, 5 mg at 02/03/20 0809 .  ascorbic acid (VITAMIN C) tablet 500 mg, 500 mg, Oral, BID, Enzo Bi, MD, 500 mg at 02/03/20 0810 .  aspirin EC tablet 81 mg, 81 mg, Oral, Daily, Dew, Erskine Squibb, MD, 81 mg at 02/03/20 0810 .  atorvastatin (LIPITOR) tablet 40 mg, 40 mg, Oral, QHS, Algernon Huxley, MD, 40 mg at 02/02/20 2112 .  Chlorhexidine Gluconate Cloth 2 % PADS 6 each, 6 each, Topical, Daily, Enzo Bi, MD, 6 each at 02/02/20 1724 .  cholecalciferol (VITAMIN D3) tablet 1,000 Units, 1,000 Units, Oral, Daily, Algernon Huxley, MD, 1,000 Units at 02/03/20 0810 .  clopidogrel (PLAVIX) tablet 75 mg, 75 mg, Oral, Daily, Algernon Huxley, MD, 75 mg at 02/03/20 0810 .  DULoxetine (CYMBALTA) DR capsule 20 mg, 20 mg, Oral, Daily, Eulas Post, MD, 20 mg at 02/03/20 0809 .  famotidine (PEPCID) tablet 20 mg, 20 mg, Oral, Daily, Dew, Erskine Squibb, MD, 20 mg at 02/03/20 0809 .  feeding supplement (NEPRO CARB STEADY) liquid 237 mL, 237 mL, Oral, BID BM, Enzo Bi, MD, 237 mL  at 02/03/20 6568 .  fluticasone (FLONASE) 50 MCG/ACT nasal spray 2 spray, 2 spray, Each Nare, Daily, Dew, Erskine Squibb, MD, 2 spray at 02/03/20 [phone] .  gabapentin (NEURONTIN) capsule 200 mg, 200 mg, Oral, TID, Eulas Post, MD, 200 mg at 02/03/20 0810 .  haloperidol (HALDOL) tablet 1 mg, 1 mg, Oral, BID, Eulas Post, MD, 1 mg at 02/03/20 0810 .  hydrALAZINE (APRESOLINE) injection 5 mg, 5 mg, Intravenous, Q2H PRN, Dew, Erskine Squibb, MD .  insulin aspart (novoLOG) injection 0-15 Units, 0-15 Units, Subcutaneous, TID AC & HS, Sharion Settler, NP, 3 Units at 02/03/20 [phone] .  insulin aspart (novoLOG) injection 6 Units, 6 Units, Subcutaneous, TID WC, Enzo Bi, MD, 6 Units at 02/03/20 [phone] .  insulin glargine (LANTUS)  injection 10 Units, 10 Units, Subcutaneous, Daily, Sharen Hones, MD, 10 Units at 02/03/20 [phone] .  multivitamin with minerals tablet 1 tablet, 1 tablet, Oral, Daily, Enzo Bi, MD, 1 tablet at 02/03/20 0809 .  nitroGLYCERIN (NITROSTAT) SL tablet 0.4 mg, 0.4 mg, Sublingual, Q5 min PRN, Lucky Cowboy, Erskine Squibb, MD .  OLANZapine zydis (ZYPREXA) disintegrating tablet 12.5 mg, 12.5 mg, Oral, QHS, Eulas Post, MD, 12.5 mg at 02/02/20 2112 .  ondansetron (ZOFRAN) injection 4 mg, 4 mg, Intravenous, Q8H PRN, Algernon Huxley, MD, 4 mg at 02/02/20 2242 .  polyethylene glycol (MIRALAX / GLYCOLAX) packet 17 g, 17 g, Oral, BID, Enzo Bi, MD, 17 g at 02/03/20 6384 .  sodium zirconium cyclosilicate (LOKELMA) packet 5 g, 5 g, Oral, BID, Enzo Bi, MD, 5 g at 02/03/20 5364 .  tamsulosin (FLOMAX) capsule 0.4 mg, 0.4 mg, Oral, Daily, Dew, Erskine Squibb, MD, 0.4 mg at 02/03/20 0809 .  vitamin B-12 (CYANOCOBALAMIN) tablet 1,000 mcg, 1,000 mcg, Oral, Daily, Enzo Bi, MD, 1,000 mcg at 02/03/20 0810  Patients Current Diet:  Diet Order            DIET FINGER FOODS Room service appropriate? Yes; Fluid consistency: Thin  Diet effective now                 Precautions / Restrictions Precautions Precautions:  Fall Precaution Comments: Suicide precautions Other Brace: postop shoe Restrictions Weight Bearing Restrictions: Yes RLE Weight Bearing: Weight bearing as tolerated LLE Weight Bearing: Weight bearing as tolerated LLE Partial Weight Bearing Percentage or Pounds: FWB through the L heel only with post op shoe donned   Has the patient had 2 or more falls or a fall with injury in the past year?No  Prior Activity Level Limited Community (1-2x/wk): Mod I with rw short distances  Prior Functional Level Prior Function Level of Independence: Needs assistance Comments: Assist for IADLs  Self Care: Did the patient need help bathing, dressing, using the toilet or eating?  Independent  Indoor Mobility: Did the patient need assistance with walking from room to room (with or without device)? Independent  Stairs: Did the patient need assistance with internal or external stairs (with or without device)? Needed some help  Functional Cognition: Did the patient need help planning regular tasks such as shopping or remembering to take medications? Needed some help  Home Assistive Devices / [place] Devices/Equipment: Gilford Rile (specify type) Home Equipment: Walker - 2 wheels  Prior Device Use: Indicate devices/aids used by the patient prior to current illness, exacerbation or injury? Walker  Current Functional Level Cognition  Arousal/Alertness: Awake/alert Overall Cognitive Status: Within Functional Limits for tasks assessed Orientation Level: Oriented X4 Safety/Judgement: Decreased awareness of safety, Decreased awareness of deficits General Comments: Oriented to situation, requires MAX VCs for visual scanning Attention: Selective Selective Attention: Appears intact Memory: Appears intact Awareness: Appears intact Problem Solving: Appears intact Safety/Judgment: Appears intact    Extremity Assessment (includes Sensation/Coordination)  Upper Extremity Assessment: RUE  deficits/detail RUE Deficits / Details: AAROM WFL, hx of CVA weakness on R side  Lower Extremity Assessment: Generalized weakness    ADLs  Overall ADL's : Needs assistance/impaired General ADL Comments: MAX A don L post-op shoe seated EOB, MOD A don R personal shoe (stuffed toes for balance). MIN A + RW tooth brushing standing sink side - pt required MAX VCs for visual scanning to locate toothpaste left of sink and single UE support for balance, tolerated ~2 mins standing prior to initating return to  bed reporting fatigue.     Mobility  Overal bed mobility: Needs Assistance Bed Mobility: Supine to Sit, Sit to Supine Supine to sit: Modified independent (Device/Increase time) Sit to supine: Min assist General bed mobility comments: Min A for final positioning during sit to sup; extra time and effort with sup to sit    Transfers  Overall transfer level: Needs assistance Equipment used: Rolling walker (2 wheeled) Transfers: Sit to/from Stand Sit to Stand: Min assist, From elevated surface Stand pivot transfers: Max assist, From elevated surface General transfer comment: Min A to come to full upright standing and to prevent posterior LOB upon initial stand    Ambulation / Gait / Stairs / Wheelchair Mobility  Ambulation/Gait Ambulation/Gait assistance: Counsellor (Feet): 5 Feet Assistive device: Rolling walker (2 wheeled) Gait Pattern/deviations: Trunk flexed, Drifts right/left, Decreased step length - right, Decreased step length - left, Step-to pattern General Gait Details: Mod to max verbal and visual cues/demonstration for step-to sequencing for WB through L heel only; pt c/o dizziness after limited amb and returned to sitting, see comments for orthostatic BP's Gait velocity: decreased    Posture / Balance Dynamic Sitting Balance Sitting balance - Comments: lateral/posterior LOBs c weight shifting/functional reach Balance Overall balance assessment: Needs  assistance Sitting-balance support: No upper extremity supported, Feet supported Sitting balance-Leahy Scale: Fair Sitting balance - Comments: lateral/posterior LOBs c weight shifting/functional reach Standing balance support: Bilateral upper extremity supported, During functional activity Standing balance-Leahy Scale: Poor Standing balance comment: Min A to prevent posterior LOB upon initial stand    Special needs/care consideration Designated visitor on admit is daughter, Nira Conn 89 fr urethral catheter placed 01/22/2020 Sacrum medial stage 2 Left foot incision Skin tear right hand   Previous Home Environment  Living Arrangements: Other (Comment) ([place] in boarding house. has own room; hallway bathroom 5o f)  Lives With: Alone Available Help at Discharge:  (Amy, who lives also in boarding house is paid to assist 2 to) Type of Home: Other(Comment) (boarding house with other renters. Own room but shared bathr) Home Layout: Multi-level, Able to live on main level with bedroom/bathroom (shared bathroom 50 feet down hall from his room) Alternate Level Stairs-Number of Steps: 6 to 8 stair entry per family Home Access: Stairs to enter Entrance Stairs-Rails: Left Entrance Stairs-Number of Steps: 6 to 8 steps entry per family Bathroom Shower/Tub: Gaffer, Architectural technologist: Associate Professor Accessibility: Yes How Accessible: Accessible via walker [place]: Yes Type of [place]:  ([place] via [facility]; PCS aide via [place] [facility]) Additional Comments: PCA assists 2.5hrs/day with groceries, meal prep, laundry, etc...  Discharge Living Setting Plans for Discharge Living Setting: Other (Comment) (shared boarding house) Type of Home at Discharge:  (boarding house) Discharge Home Layout: Multi-level, Able to live on main level with bedroom/bathroom (shared bathroom down hall from his private room. 5o feet fro) Alternate Level Stairs-Rails:  Left Discharge Home Access: Stairs to enter Entrance Stairs-Rails: Left, Right Entrance Stairs-Number of Steps: 6 to 8 steps per family; if unable to do stairs, daughter request ambulance home Discharge Bathroom Shower/Tub: Walk-in shower, Curtain Discharge Bathroom Toilet: Standard Discharge Bathroom Accessibility: Yes How Accessible: Accessible via walker Does the patient have any problems obtaining your medications?: No   Has own room in boarding house. Shares bathroom in hallway which is 50 feet from his room. Has [facility] and OT. Through the [place] has an aide through "[facility]" agency but  they are unreliable. TOC, Colletta [place] has reported this to the [place] with Daryl to assist. Dr. Daneil Dolin is his [facility] MD. Family have been paying a person, Amy, who also lives in boarding house to assist a couple of hours per day.   Social/Family/Support Systems Patient Roles: Parent Contact Information: Donah Driver is daughter and legal guardian per patient Anticipated Caregiver: patient can not go to live with daughter due to his legal issues. (Has to return to boarding house with intermittent asisst) Anticipated Caregiver's Contact Information: [phone] Ability/Limitations of Caregiver: Patient can not go live with daughter due to legal issues Caregiver Availability: Intermittent Discharge Plan Discussed with Primary Caregiver: Yes Is Caregiver In Agreement with Plan?: Yes Does Caregiver/Family have Issues with Lodging/Transportation while Pt is in Rehab?: No  Goals Patient/Family Goal for Rehab: Mod I with PT, OT, and SLP with intermittent assist Expected length of stay: ELOS 7 to 10 days Additional Information: Nira Conn has informed his Research officer, trade union of plan for CIR admit before return home Pt/Family Agrees to Admission and willing to participate: Yes Program Orientation Provided & Reviewed with Pt/Caregiver Including Roles  & Responsibilities: Yes Additional Information Needs:  TOC team director will asisst with added aide services at home if requested from CIR  Barriers to Discharge:  (Patient unable to be placed at SNF due to legal issues)   Decrease burden of Care through IP rehab admission: n/a  Possible need for SNF placement upon discharge:Unable to be placed in SNF due to his legal issues  Patient Condition: This patient's medical and functional status has changed since the consult dated: 01/31/2020 in which the Rehabilitation Physician determined and documented that the patient's condition is appropriate for intensive rehabilitative care in an inpatient rehabilitation facility. See "History of Present Illness" (above) for medical update. Functional changes are: min assist. Patient's medical and functional status update has been discussed with the Rehabilitation physician and patient remains appropriate for inpatient rehabilitation. Will admit to inpatient rehab today.  Preadmission Screen Completed By:  Cleatrice Burke, RN, 02/03/2020 9:48 AM ______________________________________________________________________   Discussed status with Dr. Ranell Patrick on 02/03/2020 at 0950   and received approval for admission today.  Admission Coordinator:  Cleatrice Burke, time 0301 Date 02/03/2020

## 2020-02-02 NOTE — Progress Notes (Signed)
Nutrition Follow-up  DOCUMENTATION CODES:   Severe malnutrition in context of chronic illness  INTERVENTION:  Discontinue Ensure Enlive TID d/t hyperkalemia  Nepro Shake po BID, each supplement provides 425 kcal and 19 grams protein  Continue Vit C 500 mg po BID  Continue MVI daily  NUTRITION DIAGNOSIS:   Severe Malnutrition related to chronic illness (CKD, CHF, DM) as evidenced by severe muscle depletion, severe fat depletion. -ongoing  GOAL:   Patient will meet greater than or equal to 90% of their needs -meeting with po intake and oral nutrition supplements.  MONITOR:   PO intake, Supplement acceptance, Labs, Weight trends, Skin, I & O's  REASON FOR ASSESSMENT:   LOS    ASSESSMENT:  RD working remotely.  69 y.o. Caucasian male with medical history significant of hypertension, hyperlipidemia, diabetes mellitus, stroke, GERD, depression, RLS, PVD, CAD, CKD-3, atrial fibrillation on Eliquis, chronic left greater toe osteomyelitis, sCHF with EF of 40-45%, who presents with altered mental status and overdose  Patient with good appetite and intake, eating 60-100% (83% average) of the last 7 documented intakes and has been consuming Ensure supplement TID. Will discontinue Ensure due to hyperkalemia, order Nepro supplement to aid with meeting needs.  Per chart, weights have remained stable during admission.   Medications reviewed and include: Pacerone, Vit C, D3, Pepcid, SSI, Lantus, MVI with minerals, Zyprexa, Miralax, Lokelma, B12  Labs: CBGs 271,175,220,225, Na 131 (L), K 5.6 (H), BUN 31 (H), Cr 1.38 (H), Hgb 8.9 (L), HCT 26.5 (L)  Diet Order:   Diet Order            Diet - low sodium heart healthy           DIET FINGER FOODS Room service appropriate? Yes; Fluid consistency: Thin  Diet effective now                 EDUCATION NEEDS:   Education needs have been addressed  Skin:  Skin Assessment: Reviewed RN Assessment (Stage II sacrum, incision L foot, skin  tear R hand)  Last BM:  9/1-type 5  Height:   Ht Readings from Last 1 Encounters:  01/23/20 5\' 9"  (1.753 m)    Weight:   Wt Readings from Last 1 Encounters:  02/02/20 73.9 kg    Ideal Body Weight:  72.7 kg  BMI:  Body mass index is 24.06 kg/m.  Estimated Nutritional Needs:   Kcal:  2000-2300kcal/day  Protein:  100-115g/day  Fluid:  1.8-2.1L/day   Lajuan Lines, RD, LDN Clinical Nutrition After Hours/Weekend Pager # in [place]

## 2020-02-02 NOTE — Progress Notes (Signed)
OT Cancellation Note  Patient Details Name: Jason Montes MRN: [id number] DOB: 1950-12-28   Cancelled Treatment:    Reason Eval/Treat Not Completed: Medical issues which prohibited therapy. Current lab value of 5.6 for K+ which contraindicates pt participation in OT intervention. OT will follow up when pt is medically appropriate for participation.  Darleen Crocker, [place], OTR/L , CBIS ascom [phone]  02/02/20, 8:26 AM   02/02/2020, 8:24 AM

## 2020-02-02 NOTE — Progress Notes (Signed)
PROGRESS NOTE    Jason Montes  GHW:[id number] DOB: 11/07/1950 DOA: 01/11/2020 PCP: [facility]  Brief Narrative: Jason Montes a 69 y.o.Caucasian malewith medical history significant ofhypertension, hyperlipidemia, diabetes mellitus, stroke, GERD, depression, RLS, PVD, CAD, CKD-3, atrial fibrillation on Eliquis, chronic left greater toe osteomyelitis,sCHF with EF of 40-45%, who presents with altered mental status and overdose ?unintentional.  While in the hospital, patient had a toe amputation for osteomyelitis, received 5 days of antibiotics after treatment.  He also developed aspiration pneumonia, completed antibiotics. Patient has a profound mental status changes, considered Wernicke's syndrome, given the high-dose thiamine.  Condition had improved to baseline. Patient also had a suicidal ideation, has been seen by psychiatry, he is cleared for discharge from psychiatry.  The only active problem is urinary retention.  Patient had 1370 mm of residual.  Attempt to remove Foley catheter after 1 week was without success.  Patient needs followed by urology in the future and keep the Foley catheter for now.     Assessment & Plan:   Principal Problem:   Acute metabolic encephalopathy Active Problems:   Atrial fibrillation (HCC)   CAD (coronary artery disease)   GERD with esophagitis   Chronic systolic CHF (congestive heart failure) (HCC)   Hyperlipidemia   Essential hypertension   Acute renal failure superimposed on stage 3a chronic kidney disease (HCC)   Protein-calorie malnutrition, severe (HCC)   Stage 3a chronic kidney disease   Osteomyelitis of great toe of left foot (HCC)   Type II diabetes mellitus with renal manifestations (HCC)   Stroke ([place])   Depression   Suicidal behavior   Pressure injury of skin   Acute urinary retention   Hypotension   Nonsustained ventricular tachycardia (HCC)   Aspiration pneumonia of both lower lobes due to  gastric secretions ([place])   Wernicke encephalopathy   Encounter for psychological evaluation  Patient condition has back to baseline.  Only active problems are urinary retention and generalized weakness. He will need to keep Foley catheter and follow up with urology as outpatient. He continue receiving physical therapy/Occupational Therapy.  Psych consulted and determined pt has decision-making capacity.   DVT prophylaxis: VE:[id number] Code Status: DNR  Family Communication:  Status is: inpatient Dispo:   The patient is from: boarding house Anticipated d/c is to: [facility] Anticipated d/c date is: tomorrow Patient currently is medically stable to d/c    Consultants:  Psychiatry and neurology.  Procedures:Foley Antimicrobials:None    Subjective: Checked orthostatic BP this morning.  Pt reported feeling dizzy when got up, but not as bad as yesterday.  BP measurement negative for orthostasis.    Pt said he is ready to go to [facility] tomorrow.   Objective: Vitals:   02/02/20 0459 02/02/20 0500 02/02/20 0815 02/02/20 1134  BP: (!) 115/56  (!) 111/55 (!) 118/56  Pulse: 83  83 80  Resp:    16  Temp: 97.6 F (36.4 C)   97.7 F (36.5 C)  TempSrc: Oral   Oral  SpO2: 96%   96%  Weight:  73.9 kg    Height:        Intake/Output Summary (Last 24 hours) at 02/02/2020 1638 Last data filed at 02/02/2020 0900 Gross per 24 hour  Intake 320 ml  Output 1600 ml  Net -1280 ml   Filed Weights   01/29/20 0426 01/31/20 0429 02/02/20 0500  Weight: 75.8 kg 74.5 kg 73.9 kg    Examination:  Constitutional: NAD, alert, coherent HEENT: conjunctivae and  lids normal, EOMI CV: RRR no M,R,G. Distal pulses +2.  No cyanosis.   RESP: CTA B/L, normal respiratory effort  GI: +BS, NTND Extremities: Left foot in wraps.  Right toes all amputated. SKIN: warm, dry and intact Neuro: II - XII grossly intact.     Data Reviewed: I have personally reviewed following labs and imaging  studies  CBC: Recent Labs  Lab 01/27/20 0444 01/30/20 0653 02/02/20 0447  WBC 8.8 7.5 9.9  HGB 9.2* 9.6* 8.9*  HCT 27.7* 30.4* 26.5*  MCV 87.1 89.9 84.4  PLT 196 257 536   Basic Metabolic Panel: Recent Labs  Lab 01/27/20 0444 01/30/20 0657 02/02/20 0447  NA 136  --  131*  K 4.6  --  5.6*  CL 104  --  95*  CO2 26  --  30  GLUCOSE 97  --  197*  BUN 21  --  31*  CREATININE 1.28* 1.27* 1.38*  CALCIUM 8.6*  --  9.4  MG 2.4  --  2.3   GFR: Estimated Creatinine Clearance: 51.9 mL/min (A) (by C-G formula based on SCr of 1.38 mg/dL (H)). Liver Function Tests: No results for input(s): AST, ALT, ALKPHOS, BILITOT, PROT, ALBUMIN in the last 168 hours. No results for input(s): LIPASE, AMYLASE in the last 168 hours. No results for input(s): AMMONIA in the last 168 hours. Coagulation Profile: No results for input(s): INR, PROTIME in the last 168 hours. Cardiac Enzymes: No results for input(s): CKTOTAL, CKMB, CKMBINDEX, TROPONINI in the last 168 hours. BNP (last 3 results) No results for input(s): PROBNP in the last 8760 hours. HbA1C: No results for input(s): HGBA1C in the last 72 hours. CBG: Recent Labs  Lab 02/01/20 1126 02/01/20 1636 02/01/20 2119 02/02/20 0732 02/02/20 1136  GLUCAP 218* 225* 220* 175* 271*   Lipid Profile: No results for input(s): CHOL, HDL, LDLCALC, TRIG, CHOLHDL, LDLDIRECT in the last 72 hours. Thyroid Function Tests: No results for input(s): TSH, T4TOTAL, FREET4, T3FREE, THYROIDAB in the last 72 hours. Anemia Panel: No results for input(s): VITAMINB12, FOLATE, FERRITIN, TIBC, IRON, RETICCTPCT in the last 72 hours. Sepsis Labs: No results for input(s): PROCALCITON, LATICACIDVEN in the last 168 hours.  Recent Results (from the past 240 hour(s))  SARS Coronavirus 2 by RT PCR (hospital order, performed in [facility] hospital lab) Nasopharyngeal Nasopharyngeal Swab     Status: None   Collection Time: 01/27/20 12:00 PM   Specimen: Nasopharyngeal  Swab  Result Value Ref Range Status   SARS Coronavirus 2 NEGATIVE NEGATIVE Final    Comment: (NOTE) SARS-CoV-2 target nucleic acids are NOT DETECTED.  The SARS-CoV-2 RNA is generally detectable in upper and lower respiratory specimens during the acute phase of infection. The lowest concentration of SARS-CoV-2 viral copies this assay can detect is 250 copies / mL. A negative result does not preclude SARS-CoV-2 infection and should not be used as the sole basis for treatment or other patient management decisions.  A negative result may occur with improper specimen collection / handling, submission of specimen other than nasopharyngeal swab, presence of viral mutation(s) within the areas targeted by this assay, and inadequate number of viral copies (<250 copies / mL). A negative result must be combined with clinical observations, patient history, and epidemiological information.  Fact Sheet for Patients:   StrictlyIdeas.no  Fact Sheet for Healthcare Providers: BankingDealers.co.za  This test is not yet approved or  cleared by the [place] FDA and has been authorized for detection and/or diagnosis of SARS-CoV-2 by FDA  under an Emergency Use Authorization (EUA).  This EUA will remain in effect (meaning this test can be used) for the duration of the COVID-19 declaration under Section 564(b)(1) of the Act, 21 U.S.C. section 360bbb-3(b)(1), unless the authorization is terminated or revoked sooner.  Performed at [facility], [address], [place], [place] [address]          Radiology Studies: No results found.      Scheduled Meds: . amiodarone  400 mg Oral Daily  . apixaban  5 mg Oral BID  . vitamin C  500 mg Oral BID  . aspirin EC  81 mg Oral Daily  . atorvastatin  40 mg Oral QHS  . Chlorhexidine Gluconate Cloth  6 each Topical Daily  . cholecalciferol  1,000 Units Oral Daily  . clopidogrel  75 mg Oral  Daily  . DULoxetine  20 mg Oral Daily  . famotidine  20 mg Oral Daily  . feeding supplement (NEPRO CARB STEADY)  237 mL Oral BID BM  . fluticasone  2 spray Each Nare Daily  . gabapentin  200 mg Oral TID  . haloperidol  1 mg Oral BID  . insulin aspart  0-15 Units Subcutaneous TID AC & HS  . insulin aspart  6 Units Subcutaneous TID WC  . insulin glargine  10 Units Subcutaneous Daily  . multivitamin with minerals  1 tablet Oral Daily  . OLANZapine zydis  12.5 mg Oral QHS  . polyethylene glycol  17 g Oral BID  . sodium zirconium cyclosilicate  5 g Oral BID  . tamsulosin  0.4 mg Oral Daily  . vitamin B-12  1,000 mcg Oral Daily   Continuous Infusions: . sodium chloride 500 mL (01/27/20 1711)     LOS: 21 days     Enzo Bi, MD [facility]   To contact the attending provider between 7A-7P or the covering provider during after hours 7P-7A, please log into the web site [email or web address] and access using universal [place] password for that web site. If you do not have the password, please call the hospital operator.  02/02/2020, 4:38 PM

## 2020-02-02 NOTE — TOC Progression Note (Signed)
Transition of Care [facility]) - Progression Note    Patient Details  Name: [name] MRN: [id number] Date of Birth: [birth year]  Transition of Care [facility]) CM/SW Contact  Beverly Sessions, RN Phone Number: 02/02/2020, 4:36 PM  Clinical Narrative:    Plan for admission to [place] spoke with daughter Jason Montes.  She states that the patient's PCS services is through the [place] through a company called [employer].  They are supposed to come out 2.5 hours a day however are not reliable.   Daughter states they private pay one of the other residents at the boarding out to check on the patient daily, and they tend to continue paying for services after the patient returns  RNCM reached out to [place] to request increase number of PCS hours.  I was notified to submit request in writing.  I Faxed letter to Dr Daneil Dolin at [phone].  Requesting an increase in hours, as well a change in agency as family is stating they do not come out as contracted         Expected Discharge Plan and Services           Expected Discharge Date: 01/27/20                                     Social Determinants of Health (SDOH) Interventions    Readmission Risk Interventions Readmission Risk Prevention Plan 01/17/2020  Medication Review (RN Care Manager) Complete  HRI or Home Care Consult Complete  Palliative Care Screening Not Applicable  Some recent data might be hidden

## 2020-02-02 NOTE — Care Management Important Message (Signed)
Important Message  Patient Details  Name: Jason Montes MRN: [id number] Date of Birth: Sep 19, 1950   Medicare Important Message Given:  Yes     Dannette Barbara 02/02/2020, 1:51 PM

## 2020-02-03 ENCOUNTER — Encounter (HOSPITAL_COMMUNITY): Payer: Self-pay | Admitting: Physical Medicine & Rehabilitation

## 2020-02-03 ENCOUNTER — Inpatient Hospital Stay (HOSPITAL_COMMUNITY)
Admission: RE | Admit: 2020-02-03 | Discharge: 2020-02-28 | DRG: 945 | Disposition: A | Discharge status: Home Health | Payer: Medicare Other | Source: Other Acute Inpatient Hospital | Attending: Physical Medicine & Rehabilitation | Admitting: Physical Medicine & Rehabilitation

## 2020-02-03 DIAGNOSIS — G8918 Other acute postprocedural pain: Secondary | ICD-10-CM

## 2020-02-03 DIAGNOSIS — E871 Hypo-osmolality and hyponatremia: Secondary | ICD-10-CM | POA: Diagnosis not present

## 2020-02-03 DIAGNOSIS — F0150 Vascular dementia without behavioral disturbance: Secondary | ICD-10-CM | POA: Diagnosis present

## 2020-02-03 DIAGNOSIS — M86672 Other chronic osteomyelitis, left ankle and foot: Secondary | ICD-10-CM | POA: Diagnosis present

## 2020-02-03 DIAGNOSIS — I2510 Atherosclerotic heart disease of native coronary artery without angina pectoris: Secondary | ICD-10-CM | POA: Diagnosis present

## 2020-02-03 DIAGNOSIS — Z89412 Acquired absence of left great toe: Secondary | ICD-10-CM

## 2020-02-03 DIAGNOSIS — N390 Urinary tract infection, site not specified: Secondary | ICD-10-CM | POA: Diagnosis not present

## 2020-02-03 DIAGNOSIS — Z833 Family history of diabetes mellitus: Secondary | ICD-10-CM

## 2020-02-03 DIAGNOSIS — Z23 Encounter for immunization: Secondary | ICD-10-CM | POA: Diagnosis present

## 2020-02-03 DIAGNOSIS — I5022 Chronic systolic (congestive) heart failure: Secondary | ICD-10-CM

## 2020-02-03 DIAGNOSIS — Z950 Presence of cardiac pacemaker: Secondary | ICD-10-CM

## 2020-02-03 DIAGNOSIS — R079 Chest pain, unspecified: Secondary | ICD-10-CM | POA: Diagnosis not present

## 2020-02-03 DIAGNOSIS — Z8249 Family history of ischemic heart disease and other diseases of the circulatory system: Secondary | ICD-10-CM

## 2020-02-03 DIAGNOSIS — L89152 Pressure ulcer of sacral region, stage 2: Secondary | ICD-10-CM | POA: Diagnosis present

## 2020-02-03 DIAGNOSIS — G9341 Metabolic encephalopathy: Secondary | ICD-10-CM | POA: Diagnosis present

## 2020-02-03 DIAGNOSIS — E875 Hyperkalemia: Secondary | ICD-10-CM | POA: Diagnosis present

## 2020-02-03 DIAGNOSIS — Z7901 Long term (current) use of anticoagulants: Secondary | ICD-10-CM

## 2020-02-03 DIAGNOSIS — Z89431 Acquired absence of right foot: Secondary | ICD-10-CM

## 2020-02-03 DIAGNOSIS — R7309 Other abnormal glucose: Secondary | ICD-10-CM

## 2020-02-03 DIAGNOSIS — I4891 Unspecified atrial fibrillation: Secondary | ICD-10-CM | POA: Diagnosis present

## 2020-02-03 DIAGNOSIS — I69398 Other sequelae of cerebral infarction: Secondary | ICD-10-CM

## 2020-02-03 DIAGNOSIS — R339 Retention of urine, unspecified: Secondary | ICD-10-CM | POA: Diagnosis not present

## 2020-02-03 DIAGNOSIS — E512 Wernicke's encephalopathy: Secondary | ICD-10-CM

## 2020-02-03 DIAGNOSIS — R251 Tremor, unspecified: Secondary | ICD-10-CM | POA: Diagnosis present

## 2020-02-03 DIAGNOSIS — F329 Major depressive disorder, single episode, unspecified: Secondary | ICD-10-CM | POA: Diagnosis present

## 2020-02-03 DIAGNOSIS — N1831 Chronic kidney disease, stage 3a: Secondary | ICD-10-CM | POA: Diagnosis present

## 2020-02-03 DIAGNOSIS — L8990 Pressure ulcer of unspecified site, unspecified stage: Secondary | ICD-10-CM

## 2020-02-03 DIAGNOSIS — R682 Dry mouth, unspecified: Secondary | ICD-10-CM | POA: Diagnosis not present

## 2020-02-03 DIAGNOSIS — K5900 Constipation, unspecified: Secondary | ICD-10-CM | POA: Diagnosis not present

## 2020-02-03 DIAGNOSIS — D696 Thrombocytopenia, unspecified: Secondary | ICD-10-CM

## 2020-02-03 DIAGNOSIS — X58XXXD Exposure to other specified factors, subsequent encounter: Secondary | ICD-10-CM | POA: Diagnosis not present

## 2020-02-03 DIAGNOSIS — I951 Orthostatic hypotension: Secondary | ICD-10-CM | POA: Diagnosis present

## 2020-02-03 DIAGNOSIS — I10 Essential (primary) hypertension: Secondary | ICD-10-CM

## 2020-02-03 DIAGNOSIS — F419 Anxiety disorder, unspecified: Secondary | ICD-10-CM | POA: Diagnosis present

## 2020-02-03 DIAGNOSIS — D175 Benign lipomatous neoplasm of intra-abdominal organs: Secondary | ICD-10-CM | POA: Diagnosis present

## 2020-02-03 DIAGNOSIS — R45851 Suicidal ideations: Secondary | ICD-10-CM

## 2020-02-03 DIAGNOSIS — E1151 Type 2 diabetes mellitus with diabetic peripheral angiopathy without gangrene: Secondary | ICD-10-CM | POA: Diagnosis present

## 2020-02-03 DIAGNOSIS — E1122 Type 2 diabetes mellitus with diabetic chronic kidney disease: Secondary | ICD-10-CM | POA: Diagnosis present

## 2020-02-03 DIAGNOSIS — Z8674 Personal history of sudden cardiac arrest: Secondary | ICD-10-CM

## 2020-02-03 DIAGNOSIS — R451 Restlessness and agitation: Secondary | ICD-10-CM | POA: Diagnosis present

## 2020-02-03 DIAGNOSIS — E1169 Type 2 diabetes mellitus with other specified complication: Secondary | ICD-10-CM | POA: Diagnosis present

## 2020-02-03 DIAGNOSIS — I130 Hypertensive heart and chronic kidney disease with heart failure and stage 1 through stage 4 chronic kidney disease, or unspecified chronic kidney disease: Secondary | ICD-10-CM | POA: Diagnosis not present

## 2020-02-03 DIAGNOSIS — N179 Acute kidney failure, unspecified: Secondary | ICD-10-CM | POA: Diagnosis present

## 2020-02-03 DIAGNOSIS — E785 Hyperlipidemia, unspecified: Secondary | ICD-10-CM | POA: Diagnosis present

## 2020-02-03 DIAGNOSIS — G2581 Restless legs syndrome: Secondary | ICD-10-CM | POA: Diagnosis present

## 2020-02-03 DIAGNOSIS — N400 Enlarged prostate without lower urinary tract symptoms: Secondary | ICD-10-CM | POA: Diagnosis present

## 2020-02-03 DIAGNOSIS — Z794 Long term (current) use of insulin: Secondary | ICD-10-CM

## 2020-02-03 DIAGNOSIS — Z915 Personal history of self-harm: Secondary | ICD-10-CM

## 2020-02-03 DIAGNOSIS — Z800 Family history of malignant neoplasm of digestive organs: Secondary | ICD-10-CM

## 2020-02-03 DIAGNOSIS — D62 Acute posthemorrhagic anemia: Secondary | ICD-10-CM | POA: Diagnosis not present

## 2020-02-03 DIAGNOSIS — E1142 Type 2 diabetes mellitus with diabetic polyneuropathy: Secondary | ICD-10-CM

## 2020-02-03 DIAGNOSIS — Z951 Presence of aortocoronary bypass graft: Secondary | ICD-10-CM

## 2020-02-03 DIAGNOSIS — Z79899 Other long term (current) drug therapy: Secondary | ICD-10-CM

## 2020-02-03 DIAGNOSIS — L89620 Pressure ulcer of left heel, unstageable: Secondary | ICD-10-CM | POA: Diagnosis present

## 2020-02-03 DIAGNOSIS — T50901D Poisoning by unspecified drugs, medicaments and biological substances, accidental (unintentional), subsequent encounter: Secondary | ICD-10-CM | POA: Diagnosis not present

## 2020-02-03 DIAGNOSIS — D631 Anemia in chronic kidney disease: Secondary | ICD-10-CM | POA: Diagnosis present

## 2020-02-03 DIAGNOSIS — B964 Proteus (mirabilis) (morganii) as the cause of diseases classified elsewhere: Secondary | ICD-10-CM | POA: Diagnosis not present

## 2020-02-03 DIAGNOSIS — Z7902 Long term (current) use of antithrombotics/antiplatelets: Secondary | ICD-10-CM

## 2020-02-03 DIAGNOSIS — I252 Old myocardial infarction: Secondary | ICD-10-CM

## 2020-02-03 DIAGNOSIS — K219 Gastro-esophageal reflux disease without esophagitis: Secondary | ICD-10-CM | POA: Diagnosis present

## 2020-02-03 DIAGNOSIS — Z7982 Long term (current) use of aspirin: Secondary | ICD-10-CM

## 2020-02-03 DIAGNOSIS — R0902 Hypoxemia: Secondary | ICD-10-CM | POA: Diagnosis not present

## 2020-02-03 DIAGNOSIS — Z8349 Family history of other endocrine, nutritional and metabolic diseases: Secondary | ICD-10-CM

## 2020-02-03 LAB — MAGNESIUM: Magnesium: 2.7 mg/dL — ABNORMAL HIGH (ref 1.7–2.4)

## 2020-02-03 LAB — BASIC METABOLIC PANEL
Anion gap: 8 (ref 5–15)
BUN: 32 mg/dL — ABNORMAL HIGH (ref 8–23)
CO2: 26 mmol/L (ref 22–32)
Calcium: 9.5 mg/dL (ref 8.9–10.3)
Chloride: 98 mmol/L (ref 98–111)
Creatinine, Ser: 1.43 mg/dL — ABNORMAL HIGH (ref 0.61–1.24)
GFR calc Af Amer: 58 mL/min — ABNORMAL LOW (ref >60)
GFR calc non Af Amer: 50 mL/min — ABNORMAL LOW (ref >60)
Glucose, Bld: 133 mg/dL — ABNORMAL HIGH (ref 70–99)
Potassium: 4.9 mmol/L (ref 3.5–5.1)
Sodium: 132 mmol/L — ABNORMAL LOW (ref 135–145)

## 2020-02-03 LAB — CBC
HCT: 31.3 % — ABNORMAL LOW (ref 39.0–52.0)
Hemoglobin: 10.5 g/dL — ABNORMAL LOW (ref 13.0–17.0)
MCH: 29 pg (ref 26.0–34.0)
MCHC: 33.5 g/dL (ref 30.0–36.0)
MCV: 86.5 fL (ref 80.0–100.0)
Platelets: 130 10*3/uL — ABNORMAL LOW (ref 150–400)
RBC: 3.62 MIL/uL — ABNORMAL LOW (ref 4.22–5.81)
RDW: 16.1 % — ABNORMAL HIGH (ref 11.5–15.5)
WBC: 6 10*3/uL (ref 4.0–10.5)
nRBC: 0 % (ref 0.0–0.2)

## 2020-02-03 LAB — GLUCOSE, CAPILLARY
Glucose-Capillary: 160 mg/dL — ABNORMAL HIGH (ref 70–99)
Glucose-Capillary: 183 mg/dL — ABNORMAL HIGH (ref 70–99)
Glucose-Capillary: 309 mg/dL — ABNORMAL HIGH (ref 70–99)
Glucose-Capillary: 263 mg/dL — ABNORMAL HIGH (ref 70–99)

## 2020-02-03 MED ORDER — APIXABAN 5 MG PO TABS
5.0000 mg | ORAL_TABLET | Freq: Two times a day (BID) | ORAL | Status: DC
  Administered 2020-02-03 – 2020-02-28 (×50): 5 mg via ORAL
  Filled 2020-02-03 (×50): qty 1

## 2020-02-03 MED ORDER — HALOPERIDOL 1 MG PO TABS
1.0000 mg | ORAL_TABLET | Freq: Two times a day (BID) | ORAL | Status: DC
  Administered 2020-02-03 – 2020-02-28 (×50): 1 mg via ORAL
  Filled 2020-02-03 (×53): qty 1

## 2020-02-03 MED ORDER — ATORVASTATIN CALCIUM 40 MG PO TABS
40.0000 mg | ORAL_TABLET | Freq: Every day | ORAL | Status: DC
  Administered 2020-02-03 – 2020-02-27 (×25): 40 mg via ORAL
  Filled 2020-02-03 (×25): qty 1

## 2020-02-03 MED ORDER — POLYETHYLENE GLYCOL 3350 17 G PO PACK
17.0000 g | PACK | Freq: Two times a day (BID) | ORAL | Status: DC
  Administered 2020-02-03 – 2020-02-28 (×34): 17 g via ORAL
  Filled 2020-02-03 (×48): qty 1

## 2020-02-03 MED ORDER — OLANZAPINE 15 MG PO TBDP
15.0000 mg | ORAL_TABLET | Freq: Every day | ORAL | 0 refills | Status: DC
Start: 2020-02-03 — End: 2020-02-28

## 2020-02-03 MED ORDER — VITAMIN B-12 1000 MCG PO TABS
1000.0000 ug | ORAL_TABLET | Freq: Every day | ORAL | Status: DC
  Administered 2020-02-04 – 2020-02-28 (×25): 1000 ug via ORAL
  Filled 2020-02-03 (×25): qty 1

## 2020-02-03 MED ORDER — GABAPENTIN 100 MG PO CAPS
200.0000 mg | ORAL_CAPSULE | Freq: Three times a day (TID) | ORAL | Status: DC
  Administered 2020-02-03 – 2020-02-28 (×75): 200 mg via ORAL
  Filled 2020-02-03 (×75): qty 2

## 2020-02-03 MED ORDER — ACETAMINOPHEN 325 MG PO TABS
650.0000 mg | ORAL_TABLET | Freq: Four times a day (QID) | ORAL | Status: DC | PRN
  Administered 2020-02-04 – 2020-02-28 (×22): 650 mg via ORAL
  Filled 2020-02-03 (×26): qty 2

## 2020-02-03 MED ORDER — POLYETHYLENE GLYCOL 3350 17 G PO PACK: 17.0000 g | PACK | Freq: Two times a day (BID) | ORAL | 0 refills | Status: AC

## 2020-02-03 MED ORDER — CLOPIDOGREL BISULFATE 75 MG PO TABS: 75.0000 mg | ORAL_TABLET | Freq: Every day | ORAL | Status: DC

## 2020-02-03 MED ORDER — INSULIN GLARGINE 100 UNIT/ML ~~LOC~~ SOLN
10.0000 [IU] | Freq: Every day | SUBCUTANEOUS | Status: DC
  Filled 2020-02-03: qty 0.1

## 2020-02-03 MED ORDER — FAMOTIDINE 20 MG PO TABS
20.0000 mg | ORAL_TABLET | Freq: Every day | ORAL | Status: DC
  Administered 2020-02-04 – 2020-02-28 (×25): 20 mg via ORAL
  Filled 2020-02-03 (×25): qty 1

## 2020-02-03 MED ORDER — AMIODARONE HCL 400 MG PO TABS
200.0000 mg | ORAL_TABLET | Freq: Every day | ORAL | 0 refills | Status: DC
Start: 2020-02-03 — End: 2020-02-23

## 2020-02-03 MED ORDER — VITAMIN D 25 MCG (1000 UNIT) PO TABS
1000.0000 [IU] | ORAL_TABLET | Freq: Every day | ORAL | Status: DC
  Administered 2020-02-04 – 2020-02-28 (×25): 1000 [IU] via ORAL
  Filled 2020-02-03 (×25): qty 1

## 2020-02-03 MED ORDER — AMIODARONE HCL 200 MG PO TABS
400.0000 mg | ORAL_TABLET | Freq: Every day | ORAL | Status: DC
  Administered 2020-02-04 – 2020-02-28 (×25): 400 mg via ORAL
  Filled 2020-02-03 (×25): qty 2

## 2020-02-03 MED ORDER — INSULIN ASPART 100 UNIT/ML ~~LOC~~ SOLN
0.0000 [IU] | Freq: Three times a day (TID) | SUBCUTANEOUS | Status: DC
  Administered 2020-02-03: 11 [IU] via SUBCUTANEOUS
  Administered 2020-02-03 – 2020-02-04 (×2): 8 [IU] via SUBCUTANEOUS
  Administered 2020-02-04: 5 [IU] via SUBCUTANEOUS
  Administered 2020-02-04: 11 [IU] via SUBCUTANEOUS
  Administered 2020-02-05: 15 [IU] via SUBCUTANEOUS
  Administered 2020-02-05: 5 [IU] via SUBCUTANEOUS
  Administered 2020-02-05: 11 [IU] via SUBCUTANEOUS
  Administered 2020-02-05: 2 [IU] via SUBCUTANEOUS
  Administered 2020-02-06: 8 [IU] via SUBCUTANEOUS
  Administered 2020-02-06: 5 [IU] via SUBCUTANEOUS
  Administered 2020-02-06: 3 [IU] via SUBCUTANEOUS
  Administered 2020-02-07: 11 [IU] via SUBCUTANEOUS
  Administered 2020-02-07: 3 [IU] via SUBCUTANEOUS
  Administered 2020-02-07 – 2020-02-08 (×2): 5 [IU] via SUBCUTANEOUS
  Administered 2020-02-08: 11 [IU] via SUBCUTANEOUS
  Administered 2020-02-08: 3 [IU] via SUBCUTANEOUS
  Administered 2020-02-08 – 2020-02-09 (×2): 5 [IU] via SUBCUTANEOUS
  Administered 2020-02-09: 8 [IU] via SUBCUTANEOUS
  Administered 2020-02-09: 2 [IU] via SUBCUTANEOUS
  Administered 2020-02-10: 3 [IU] via SUBCUTANEOUS
  Administered 2020-02-10: 2 [IU] via SUBCUTANEOUS
  Administered 2020-02-10 – 2020-02-11 (×2): 5 [IU] via SUBCUTANEOUS
  Administered 2020-02-11: 2 [IU] via SUBCUTANEOUS
  Administered 2020-02-11: 3 [IU] via SUBCUTANEOUS
  Administered 2020-02-12: 5 [IU] via SUBCUTANEOUS
  Administered 2020-02-12: 3 [IU] via SUBCUTANEOUS
  Administered 2020-02-12: 5 [IU] via SUBCUTANEOUS
  Administered 2020-02-12: 8 [IU] via SUBCUTANEOUS
  Administered 2020-02-13 (×2): 3 [IU] via SUBCUTANEOUS
  Administered 2020-02-13 (×2): 5 [IU] via SUBCUTANEOUS
  Administered 2020-02-14 (×2): 2 [IU] via SUBCUTANEOUS
  Administered 2020-02-14: 15 [IU] via SUBCUTANEOUS
  Administered 2020-02-15: 2 [IU] via SUBCUTANEOUS
  Administered 2020-02-15: 3 [IU] via SUBCUTANEOUS
  Administered 2020-02-15: 5 [IU] via SUBCUTANEOUS
  Administered 2020-02-16: 2 [IU] via SUBCUTANEOUS
  Administered 2020-02-16: 3 [IU] via SUBCUTANEOUS
  Administered 2020-02-16: 8 [IU] via SUBCUTANEOUS
  Administered 2020-02-17: 3 [IU] via SUBCUTANEOUS
  Administered 2020-02-17: 5 [IU] via SUBCUTANEOUS
  Administered 2020-02-17: 2 [IU] via SUBCUTANEOUS
  Administered 2020-02-18: 3 [IU] via SUBCUTANEOUS
  Administered 2020-02-18 (×2): 5 [IU] via SUBCUTANEOUS
  Administered 2020-02-18: 8 [IU] via SUBCUTANEOUS
  Administered 2020-02-19: 5 [IU] via SUBCUTANEOUS
  Administered 2020-02-19: 2 [IU] via SUBCUTANEOUS
  Administered 2020-02-20 (×2): 3 [IU] via SUBCUTANEOUS
  Administered 2020-02-20: 2 [IU] via SUBCUTANEOUS
  Administered 2020-02-20: 3 [IU] via SUBCUTANEOUS
  Administered 2020-02-21 – 2020-02-22 (×3): 5 [IU] via SUBCUTANEOUS
  Administered 2020-02-22: 3 [IU] via SUBCUTANEOUS
  Administered 2020-02-22 – 2020-02-23 (×2): 2 [IU] via SUBCUTANEOUS
  Administered 2020-02-23 – 2020-02-24 (×3): 8 [IU] via SUBCUTANEOUS
  Administered 2020-02-24: 3 [IU] via SUBCUTANEOUS
  Administered 2020-02-24 – 2020-02-25 (×3): 5 [IU] via SUBCUTANEOUS
  Administered 2020-02-25: 2 [IU] via SUBCUTANEOUS
  Administered 2020-02-26: 3 [IU] via SUBCUTANEOUS
  Administered 2020-02-26 (×2): 5 [IU] via SUBCUTANEOUS
  Administered 2020-02-26 – 2020-02-27 (×2): 2 [IU] via SUBCUTANEOUS
  Administered 2020-02-27: 5 [IU] via SUBCUTANEOUS

## 2020-02-03 MED ORDER — NEPRO/CARBSTEADY PO LIQD
237.0000 mL | Freq: Two times a day (BID) | ORAL | Status: DC
  Administered 2020-02-04 – 2020-02-27 (×45): 237 mL via ORAL
  Filled 2020-02-03 (×8): qty 237

## 2020-02-03 MED ORDER — ASCORBIC ACID 500 MG PO TABS
500.0000 mg | ORAL_TABLET | Freq: Two times a day (BID) | ORAL | Status: DC
  Administered 2020-02-03 – 2020-02-28 (×50): 500 mg via ORAL
  Filled 2020-02-03 (×51): qty 1

## 2020-02-03 MED ORDER — INSULIN ASPART 100 UNIT/ML ~~LOC~~ SOLN
6.0000 [IU] | Freq: Three times a day (TID) | SUBCUTANEOUS | Status: DC
  Administered 2020-02-03 – 2020-02-09 (×11): 6 [IU] via SUBCUTANEOUS

## 2020-02-03 MED ORDER — DULOXETINE HCL 20 MG PO CPEP
20.0000 mg | ORAL_CAPSULE | Freq: Every day | ORAL | 0 refills | Status: DC
Start: 2020-02-03 — End: 2020-02-23

## 2020-02-03 MED ORDER — GABAPENTIN 300 MG PO CAPS: 300.0000 mg | ORAL_CAPSULE | Freq: Three times a day (TID) | ORAL | 0 refills | Status: DC

## 2020-02-03 MED ORDER — ACETAMINOPHEN 650 MG RE SUPP: 650.0000 mg | Freq: Four times a day (QID) | RECTAL | Status: DC | PRN

## 2020-02-03 MED ORDER — TAMSULOSIN HCL 0.4 MG PO CAPS
0.4000 mg | ORAL_CAPSULE | Freq: Every day | ORAL | Status: DC
  Administered 2020-02-04: 0.4 mg via ORAL
  Filled 2020-02-03 (×2): qty 1

## 2020-02-03 MED ORDER — INSULIN GLARGINE 100 UNIT/ML ~~LOC~~ SOLN
11.0000 [IU] | Freq: Every day | SUBCUTANEOUS | Status: DC
  Administered 2020-02-04 – 2020-02-05 (×2): 11 [IU] via SUBCUTANEOUS
  Filled 2020-02-03 (×2): qty 0.11

## 2020-02-03 MED ORDER — FLUTICASONE PROPIONATE 50 MCG/ACT NA SUSP
2.0000 | Freq: Every day | NASAL | Status: DC
  Administered 2020-02-04 – 2020-02-28 (×25): 2 via NASAL
  Filled 2020-02-03: qty 16

## 2020-02-03 MED ORDER — INSULIN GLARGINE 100 UNIT/ML ~~LOC~~ SOLN: 10.0000 [IU] | Freq: Every day | SUBCUTANEOUS | 11 refills | Status: DC

## 2020-02-03 MED ORDER — ASPIRIN EC 81 MG PO TBEC
81.0000 mg | DELAYED_RELEASE_TABLET | Freq: Every day | ORAL | Status: DC
  Administered 2020-02-04 – 2020-02-28 (×25): 81 mg via ORAL
  Filled 2020-02-03 (×25): qty 1

## 2020-02-03 MED ORDER — NITROGLYCERIN 0.4 MG SL SUBL: 0.4000 mg | SUBLINGUAL_TABLET | SUBLINGUAL | Status: DC | PRN

## 2020-02-03 MED ORDER — SODIUM ZIRCONIUM CYCLOSILICATE 5 G PO PACK
5.0000 g | PACK | Freq: Two times a day (BID) | ORAL | Status: DC
  Administered 2020-02-03 – 2020-02-04 (×2): 5 g via ORAL
  Filled 2020-02-03 (×4): qty 1

## 2020-02-03 MED ORDER — OLANZAPINE 5 MG PO TBDP
12.5000 mg | ORAL_TABLET | Freq: Every day | ORAL | Status: DC
  Administered 2020-02-03 – 2020-02-27 (×25): 12.5 mg via ORAL
  Filled 2020-02-03: qty 3
  Filled 2020-02-03: qty 2.5
  Filled 2020-02-03 (×10): qty 3
  Filled 2020-02-03 (×2): qty 2.5
  Filled 2020-02-03 (×2): qty 3
  Filled 2020-02-03: qty 2.5
  Filled 2020-02-03: qty 3
  Filled 2020-02-03 (×3): qty 2.5
  Filled 2020-02-03 (×2): qty 3
  Filled 2020-02-03: qty 2.5
  Filled 2020-02-03 (×3): qty 3

## 2020-02-03 MED ORDER — ADULT MULTIVITAMIN W/MINERALS CH
1.0000 | ORAL_TABLET | Freq: Every day | ORAL | Status: DC
  Administered 2020-02-04 – 2020-02-28 (×25): 1 via ORAL
  Filled 2020-02-03 (×25): qty 1

## 2020-02-03 MED ORDER — INSULIN ASPART 100 UNIT/ML ~~LOC~~ SOLN: 6.0000 [IU] | Freq: Three times a day (TID) | SUBCUTANEOUS | 11 refills | Status: DC

## 2020-02-03 MED ORDER — DULOXETINE HCL 20 MG PO CPEP
20.0000 mg | ORAL_CAPSULE | Freq: Every day | ORAL | Status: DC
  Administered 2020-02-04 – 2020-02-28 (×25): 20 mg via ORAL
  Filled 2020-02-03 (×25): qty 1

## 2020-02-03 MED ORDER — HALOPERIDOL 1 MG PO TABS
1.0000 mg | ORAL_TABLET | Freq: Two times a day (BID) | ORAL | 0 refills | Status: DC
Start: 2020-02-03 — End: 2020-02-23

## 2020-02-03 NOTE — H&P (Signed)
Physical Medicine and Rehabilitation Admission H&P    Chief Complaint  Patient presents with  . Altered Mental Status  : HPI: Jason Montes is a 69 year old right-handed male with history of hypertension, hyperlipidemia, atrial fibrillation maintained on Eliquis, CVA, diabetes mellitus, CKD stage III, CAD with cardiac arrest/pacemaker 2019 and resultant hypoxia with cognitive deficits  chronic left greater toe osteomyelitis with peripheral vascular disease, systolic congestive heart failure with ejection fraction of 40 to 45%.  Per chart review patient lives in a boardinghouse.  He has a daughter in the area.  Plan is return back to boarding home.  Presented 01/11/2020 with altered mental status.  There was some question of unintentional drug overdose.  Cranial CT scan negative for acute process.  Sequela of remote left occipital and left cerebellar insults.  Chest x-ray mild streaky bibasilar opacities favoring atelectasis.  EEG negative for seizure.  Urine drug screen positive tricyclic, sodium 025, sedimentation rate 83 glucose 115, BUN 34, creatinine 1.59, total bilirubin 1.4, hemoglobin 12, lactic acid 1.2, BNP 316.  Most recent echocardiogram ejection fraction of 40 to 45% no wall motion abnormalities.  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 Caroline More after cardiac clearance was obtained as well as later undergoing arteriography/aortogram of left lower extremity per vascular surgery Dr. Leotis Pain showing relatively normal common femoral artery profunda femoris artery and superficial femoral artery.  The posterior tibial artery was chronically occluded with no distal reconstitution.Marland Kitchen  Hospital course psychiatry service was consulted for possible drug overdose as well as capacity evaluation and patient was deemed independent to make medical decisions and has been maintained on Haldol 1 mg twice daily as well as  Zyprexa via 12.5 mg nightly.  Patient with bouts of urinary retention and initial voiding trial failed with Foley catheter tube remaining in place therapy evaluations completed patient is full weightbearing through left heel only after recent amputation with postoperative shoe.  Patient was admitted for a comprehensive rehab program.  Review of Systems  Constitutional: Positive for malaise/fatigue. Negative for chills and fever.  HENT: Negative for hearing loss.   Eyes: Negative for blurred vision and double vision.  Respiratory: Positive for shortness of breath. Negative for cough.   Cardiovascular: Positive for palpitations and leg swelling. Negative for chest pain.  Gastrointestinal: Positive for constipation. Negative for heartburn and nausea.       GERD  Genitourinary: Positive for urgency. Negative for dysuria, flank pain and hematuria.  Musculoskeletal: Positive for joint pain and myalgias.  Skin: Negative for rash.  Psychiatric/Behavioral: Positive for depression. The patient has insomnia.   All other systems reviewed and are negative.  Past Medical History:  Diagnosis Date  . Allergy   . Atrial fibrillation ([place])   . B12 deficiency   . Chronic kidney disease   . Chronic kidney disease (CKD), stage II (mild)   . Coronary atherosclerosis   . Diabetes mellitus without complication ([place])   . Elevated PSA   . Esophagitis   . GERD (gastroesophageal reflux disease)   . Heart attack ([place])   . Heart disease   . Hyperlipidemia   . Hypertension   . Hypokalemia   . Mild neurocognitive disorder   . Osteoarthritis   . Presbyopia   . PVD (peripheral vascular disease) ([place])   . Restless leg syndrome   . Stroke (cerebrum) ([place])   . Stroke ([place])   . Subacute osteomyelitis ([place])   . Tinea unguium   .  Uncompensated short term memory deficit    Past Surgical History:  Procedure Laterality Date  . AMPUTATION TOE Left 01/13/2020   Procedure: AMPUTATION RAY LEFT 1ST;  Surgeon: Caroline More, DPM;  Location: [facility] ORS;  Service: Podiatry;  Laterality: Left;  . CARDIAC PACEMAKER PLACEMENT    . COLONOSCOPY WITH PROPOFOL N/A 05/18/2018   Procedure: COLONOSCOPY WITH PROPOFOL;  Surgeon: [name], MD;  [facility] ENDOSCOPY;  Service: Endoscopy;  Laterality: N/A;  . CORONARY ARTERY BYPASS GRAFT    . FOOT AMPUTATION Right   . INTRAMEDULLARY (IM) NAIL INTERTROCHANTERIC Right 11/16/2019   Procedure: INTRAMEDULLARY (IM) NAIL INTERTROCHANTRIC;  Surgeon: Thornton Park, MD;  Location: [facility] ORS;  Service: Orthopedics;  Laterality: Right;  . LOWER EXTREMITY ANGIOGRAPHY Left 10/10/2019   Procedure: Lower Extremity Angiography;  Surgeon: Algernon Huxley, MD;  Location: [place] [facility];  Service: Cardiovascular;  Laterality: Left;  . LOWER EXTREMITY ANGIOGRAPHY Left 01/16/2020   Procedure: Lower Extremity Angiography;  Surgeon: Algernon Huxley, MD;  Location: [place] [facility];  Service: Cardiovascular;  Laterality: Left;   Family History  Problem Relation Age of Onset  . Heart failure Mother   . Colon cancer Mother   . Diabetes Mother   . Thyroid disease Sister    Social History:  reports that he has never smoked. He has never used smokeless tobacco. He reports that he does not drink alcohol and does not use drugs. Allergies:  Allergies  Allergen Reactions  . Amlodipine Other (See Comments)  . Norvasc [Amlodipine Besylate]     Unknown  . Penicillins     Childhood allergy, not sure what happens    Medications Prior to Admission  Medication Sig Dispense Refill  . apixaban (ELIQUIS) 5 MG TABS tablet Take 5 mg by mouth 2 (two) times daily. (Patient not taking: Reported on 12/23/2019)    . aspirin EC 81 MG tablet Take 81 mg by mouth daily.    Marland Kitchen atorvastatin (LIPITOR) 40 MG tablet Take 40 mg by mouth at bedtime.     . betamethasone valerate (VALISONE) 0.1 % cream Apply 1 application topically 2 (two) times daily as needed. (Patient not taking: Reported on 12/23/2019)    .  carvedilol (COREG) 6.25 MG tablet Take 3.125 mg by mouth 2 (two) times daily with a meal.     . cetirizine (ZYRTEC) 10 MG tablet Take 10 mg by mouth daily.    . Cholecalciferol (VITAMIN D3) 1000 units CAPS Take 1,000 Units by mouth daily.     . cyanocobalamin (,VITAMIN B-12,) 1000 MCG/ML injection Inject 1,000 mcg into the muscle every 30 (thirty) days. (Patient not taking: Reported on 12/23/2019)    . cyclobenzaprine (FLEXERIL) 10 MG tablet Take 10 mg by mouth 2 (two) times daily as needed for muscle spasms. (Patient not taking: Reported on 12/23/2019)    . docusate sodium (COLACE) 50 MG capsule Take 100 mg by mouth daily.  (Patient not taking: Reported on 12/23/2019)    . empagliflozin (JARDIANCE) 25 MG TABS tablet Take 12.5 mg by mouth daily.    . Ensure Max Protein (ENSURE MAX PROTEIN) LIQD Take 330 mLs (11 oz total) by mouth daily. (Patient not taking: Reported on 12/23/2019)    . famotidine (PEPCID) 20 MG tablet Take 20 mg by mouth daily.     . feeding supplement, GLUCERNA SHAKE, (GLUCERNA SHAKE) LIQD Take 237 mLs by mouth 2 (two) times daily between meals. (Patient not taking: Reported on 12/23/2019)  0  . fluticasone (  FLONASE) 50 MCG/ACT nasal spray Place 2 sprays into both nostrils daily.     Marland Kitchen gabapentin (NEURONTIN) 300 MG capsule Take 1 capsule (300 mg total) by mouth 2 (two) times daily. (Patient taking differently: Take 300-600 mg by mouth See admin instructions. Take 300 mg by mouth in the morning and 600 mg at bedtime) 60 capsule 0  . glucose 4 GM chewable tablet Chew 4 tablets by mouth as needed for low blood sugar.    . HYDROcodone-acetaminophen (NORCO/VICODIN) 5-325 MG tablet Take 1-2 tablets by mouth every 6 (six) hours as needed for severe pain. (Patient not taking: Reported on 12/23/2019) 20 tablet 0  . lisinopril (ZESTRIL) 2.5 MG tablet Take 2.5 mg by mouth daily.    . metFORMIN (GLUCOPHAGE) 500 MG tablet Start 1 tab by mouth in am daily for 7 days. Then increase to 1 tab in am & pm  for 7 days.  Continue weekly increase to 2 tabs twice daily. (Patient taking differently: Take 500 mg by mouth 2 (two) times daily. ) 120 tablet 3  . mirtazapine (REMERON) 15 MG tablet Take 15 mg by mouth at bedtime.    . nitroGLYCERIN (NITROSTAT) 0.4 MG SL tablet Place 0.4 mg under the tongue every 5 (five) minutes as needed for chest pain.    . Semaglutide, 1 MG/DOSE, 2 MG/1.5ML SOPN Inject 1 mg into the skin once a week.     . senna (SENOKOT) 8.6 MG TABS tablet Take 1 tablet (8.6 mg total) by mouth 2 (two) times daily. (Patient taking differently: Take 2 tablets by mouth in the morning and at bedtime. ) 120 tablet 0  . tamsulosin (FLOMAX) 0.4 MG CAPS capsule Take 0.4 mg by mouth daily.     Marland Kitchen torsemide (DEMADEX) 20 MG tablet Take 20 mg by mouth daily.     . traMADol (ULTRAM) 50 MG tablet Take 1 tablet (50 mg total) by mouth every 6 (six) hours. (Patient not taking: Reported on 12/23/2019) 20 tablet 0    Drug Regimen Review Drug regimen was reviewed and remains appropriate with no significant issues identified  Home: Home Living Family/patient expects to be discharged to:: Other (Comment) Living Arrangements: Other (Comment) (alons in boarding house. has own room; hallway bathroom 5o f) Available Help at Discharge:  (Amy, who lives also in boarding house is paid to assist 2 to) Type of Home: Other(Comment) (boarding house with other renters. Own room but shared bathr) Home Access: Stairs to enter Entrance Stairs-Number of Steps: 6 to 8 steps entry per family Entrance Stairs-Rails: Left Home Layout: Multi-level, Able to live on main level with bedroom/bathroom (shared bathroom 50 feet down hall from his room) Alternate Level Stairs-Number of Steps: 6 to 8 stair entry per family Bathroom Shower/Tub: Gaffer, Curtain Bathroom Toilet: Standard Bathroom Accessibility: Yes Home Equipment: Environmental consultant - 2 wheels Additional Comments: PCA assists 2.5hrs/day with groceries, meal prep, laundry,  etc...  Lives With: Alone (numerous other people live in the boarding house in [place] )   Functional History: Prior Function Level of Independence: Needs assistance Comments: Assist for IADLs  Functional Status:  Mobility: Bed Mobility Overal bed mobility: Needs Assistance Bed Mobility: Supine to Sit, Sit to Supine Supine to sit: Modified independent (Device/Increase time) Sit to supine: Min assist General bed mobility comments: Min A for final positioning during sit to sup; extra time and effort with sup to sit Transfers Overall transfer level: Needs assistance Equipment used: Rolling walker (2 wheeled) Transfers: Sit to/from Stand Sit to  Stand: Min assist, From elevated surface Stand pivot transfers: Max assist, From elevated surface General transfer comment: Min A to come to full upright standing and to prevent posterior LOB upon initial stand Ambulation/Gait Ambulation/Gait assistance: Min guard Gait Distance (Feet): 5 Feet Assistive device: Rolling walker (2 wheeled) Gait Pattern/deviations: Trunk flexed, Drifts right/left, Decreased step length - right, Decreased step length - left, Step-to pattern General Gait Details: Mod to max verbal and visual cues/demonstration for step-to sequencing for WB through L heel only; pt c/o dizziness after limited amb and returned to sitting, see comments for orthostatic BP's Gait velocity: decreased    ADL: ADL Overall ADL's : Needs assistance/impaired General ADL Comments: MAX A don L post-op shoe seated EOB, MOD A don R personal shoe (stuffed toes for balance). MIN A + RW tooth brushing standing sink side - pt required MAX VCs for visual scanning to locate toothpaste left of sink and single UE support for balance, tolerated ~2 mins standing prior to initating return to bed reporting fatigue.   Cognition: Cognition Overall Cognitive Status: Within Functional Limits for tasks assessed Arousal/Alertness: Awake/alert Orientation Level:  Oriented X4 Attention: Selective Selective Attention: Appears intact Memory: Appears intact Awareness: Appears intact Problem Solving: Appears intact Safety/Judgment: Appears intact Cognition Arousal/Alertness: Awake/alert Behavior During Therapy: WFL for tasks assessed/performed Overall Cognitive Status: Within Functional Limits for tasks assessed Area of Impairment: Orientation, Memory, Awareness, Safety/judgement, Problem solving Orientation Level: Disoriented to, Place, Time, Situation Memory: Decreased recall of precautions, Decreased short-term memory Safety/Judgement: Decreased awareness of safety, Decreased awareness of deficits Awareness: Intellectual Problem Solving: Slow processing, Decreased initiation, Requires verbal cues, Requires tactile cues, Difficulty sequencing General Comments: Oriented to situation, requires MAX VCs for visual scanning  Physical Exam: Blood pressure 118/62, pulse 80, temperature (!) 97.5 F (36.4 C), temperature source Oral, resp. rate 20, height 5\' 9"  (1.753 m), weight 71.9 kg, SpO2 94 %. Physical Exam General: Alert and oriented x 3, No apparent distress HEENT: Head is normocephalic, atraumatic, PERRLA, EOMI, sclera anicteric, oral mucosa pink and moist, dentition intact, ext ear canals clear,  Neck: Supple without JVD or lymphadenopathy Heart: Reg rate and rhythm. No murmurs rubs or gallops Chest: CTA bilaterally without wheezes, rales, or rhonchi; no distress Abdomen: Soft, non-tender, non-distended, bowel sounds positive. Extremities: No clubbing, cyanosis, or edema. Pulses are 2+ Skin: Left foot dressing in place.  Neuro: Pt is cognitively appropriate with normal insight, memory, and awareness. Cranial nerves 2-12 are intact.  Patient is alert in no acute distress.  Oriented to person and place as well as following simple commands.  Limited but fair medical historian.  Musculoskeletal: Full ROM, No pain with AROM or PROM in the neck, trunk,  or extremities. Posture appropriate Psych: Pt's affect is appropriate. Pt is cooperative  Results for orders placed or performed during the hospital encounter of 01/11/20 (from the past 48 hour(s))  Glucose, capillary     Status: Abnormal   Collection Time: 02/01/20  8:09 AM  Result Value Ref Range   Glucose-Capillary 210 (H) 70 - 99 mg/dL    Comment: Glucose reference range applies only to samples taken after fasting for at least 8 hours.   Comment 1 Notify RN   Glucose, capillary     Status: Abnormal   Collection Time: 02/01/20 11:26 AM  Result Value Ref Range   Glucose-Capillary 218 (H) 70 - 99 mg/dL    Comment: Glucose reference range applies only to samples taken after fasting for at least 8 hours.  Comment 1 Notify RN   Glucose, capillary     Status: Abnormal   Collection Time: 02/01/20  4:36 PM  Result Value Ref Range   Glucose-Capillary 225 (H) 70 - 99 mg/dL    Comment: Glucose reference range applies only to samples taken after fasting for at least 8 hours.   Comment 1 Notify RN   Glucose, capillary     Status: Abnormal   Collection Time: 02/01/20  9:19 PM  Result Value Ref Range   Glucose-Capillary 220 (H) 70 - 99 mg/dL    Comment: Glucose reference range applies only to samples taken after fasting for at least 8 hours.  Basic metabolic panel     Status: Abnormal   Collection Time: 02/02/20  4:47 AM  Result Value Ref Range   Sodium 131 (L) 135 - 145 mmol/L   Potassium 5.6 (H) 3.5 - 5.1 mmol/L   Chloride 95 (L) 98 - 111 mmol/L   CO2 30 22 - 32 mmol/L   Glucose, Bld 197 (H) 70 - 99 mg/dL    Comment: Glucose reference range applies only to samples taken after fasting for at least 8 hours.   BUN 31 (H) 8 - 23 mg/dL   Creatinine, Ser 1.38 (H) 0.61 - 1.24 mg/dL   Calcium 9.4 8.9 - 10.3 mg/dL   GFR calc non Af Amer 53 (L) >60 mL/min   GFR calc Af Amer >60 >60 mL/min   Anion gap 6 5 - 15    Comment: Performed at [facility], [address] [place], [place]  [address]  CBC     Status: Abnormal   Collection Time: 02/02/20  4:47 AM  Result Value Ref Range   WBC 9.9 4.0 - 10.5 K/uL   RBC 3.14 (L) 4.22 - 5.81 MIL/uL   Hemoglobin 8.9 (L) 13.0 - 17.0 g/dL   HCT 26.5 (L) 39 - 52 %   MCV 84.4 80.0 - 100.0 fL   MCH 28.3 26.0 - 34.0 pg   MCHC 33.6 30.0 - 36.0 g/dL   RDW 16.0 (H) 11.5 - 15.5 %   Platelets 279 150 - 400 K/uL    Comment: PLATELET COUNT PERFORMED ON CITRATED BLOOD Immature Platelet Fraction may be clinically indicated, consider ordering this additional test [id number] CONSISTENT WITH PREVIOUS RESULT    nRBC 0.0 0.0 - 0.2 %    Comment: Performed at [facility], [address] [place], [place] [address]  Magnesium     Status: None   Collection Time: 02/02/20  4:47 AM  Result Value Ref Range   Magnesium 2.3 1.7 - 2.4 mg/dL    Comment: Performed at [facility], [address] [place], [place] [address]  Glucose, capillary     Status: Abnormal   Collection Time: 02/02/20  7:32 AM  Result Value Ref Range   Glucose-Capillary 175 (H) 70 - 99 mg/dL    Comment: Glucose reference range applies only to samples taken after fasting for at least 8 hours.  Glucose, capillary     Status: Abnormal   Collection Time: 02/02/20 11:36 AM  Result Value Ref Range   Glucose-Capillary 271 (H) 70 - 99 mg/dL    Comment: Glucose reference range applies only to samples taken after fasting for at least 8 hours.  Glucose, capillary     Status: Abnormal   Collection Time: 02/02/20  4:19 PM  Result Value Ref Range   Glucose-Capillary 270 (H) 70 - 99 mg/dL    Comment: Glucose  reference range applies only to samples taken after fasting for at least 8 hours.  Glucose, capillary     Status: Abnormal   Collection Time: 02/02/20  9:00 PM  Result Value Ref Range   Glucose-Capillary 202 (H) 70 - 99 mg/dL    Comment: Glucose reference range applies only to samples taken after fasting for at least 8 hours.   No results found.     Medical  Problem List and Plan: 1.  Altered mental status secondary to acute metabolic encephalopathy related to possible drug overdose as well as history of multiple CVAs/vascular dementia  -patient may sponge bathe due to LLE surgery  -ELOS/Goals: modI 7-8 days 2.  Antithrombotics: -DVT/anticoagulation: Eliquis  -antiplatelet therapy: Aspirin 81 mg daily, Plavix 75 mg daily 3. Pain Management: Neurontin 200 mg 3 times daily, Cymbalta 20 mg daily 4. Mood: Provide emotional support  -antipsychotic agents: Haldol 1 mg twice daily, Zyprexa 12.5 mg nightly 5. Neuropsych: This patient is capable of making decisions on his own behalf. 6. Skin/Wound Care: Routine skin checks 7. Fluids/Electrolytes/Nutrition: Routine in and outs with follow-up chemistries 8.  Atrial fibrillation.  Amiodarone 400 mg daily.  Cardiac rate controlled 9.  Chronic left hallux osteomyelitis.  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 Caroline More.  Weightbearing as tolerated through left heel only with postoperative shoe 10.  Hypertension.  Monitor with increased mobility. Well controlled.  11.  Diabetes mellitus with peripheral neuropathy.  Hemoglobin A1c 7.9.  NovoLog 6 units 3 times daily with meals, Lantus insulin 10 units daily.  Diabetic teaching. Uncontrolled. Increase Lantus to 11 U 12.  CKD stage III as well as bouts of hyperkalemia.  Admission creatinine 1.59.  Continue Lokelma 5 mg twice daily, follow-up chemistries 13.  Hyperlipidemia.  Lipitor 14.  CAD with history of pacemaker.  Continue aspirin 15.  Chronic systolic congestive heart failure.  Latest ejection fraction 40 to 45%.  Monitor for any signs of fluid overload 16.  Urinary retention.  Continue Flomax.  Initial voiding trial failed.  Foley catheter tube in place.  Lavon Paganini Angiulli, PA-C 02/03/2020   I have personally performed a face to face diagnostic evaluation, including, but not limited to relevant history and physical exam findings, of this  patient and developed relevant assessment and plan.  Additionally, I have reviewed and concur with the physician assistant's documentation above.  Leeroy Cha, MD

## 2020-02-03 NOTE — Progress Notes (Signed)
Cristina Gong, RN  Rehab Admission Coordinator  Physical Medicine and Rehabilitation  PMR Pre-admission     Signed  Date of Service:  02/02/2020  6:05 PM      Related encounter: ED to Hosp-Admission (Discharged) from 01/11/2020 in [facility]      Signed       Show:Clear all [x] Manual[x] Template[x] Copied  Added by: [x] Cristina Gong, RN  [] Hover for details PMR Admission Coordinator Pre-Admission Assessment   Patient: Jason Montes is an 69 y.o., male MRN: [id number] DOB: [birth year] Height: 5\' 9"  (175.3 cm) Weight: 71.9 kg                                                                                                                                                  Insurance Information HMO:     PPO:      PCP:      IPA:      80/20:      OTHER:  PRIMARY: Medicare  A and b      Policy#: [id number]      Subscriber: pt Benefits:  Phone #: passport one online     Name: 9/2 Eff. Date: a 11/30/2017 and b 10/1 2019     Deduct: $1484      Out of Pocket Max: none      Life Max: none  CIR: 100%      SNF:  Outpatient: 80%     Co-Pay: 20% Home Health: 100%      Co-Pay: none DME: 80%     Co-Pay: 205 Providers: pt choice  SECONDARY: Medicaid [place] access      Policy#: [id number] s      Phone#: pt   Development worker, community:       Phone#:    The Therapist, art Information Summary" for patients in Inpatient Rehabilitation Facilities with attached "Privacy Act [place] Records" was provided and verbally reviewed with: Patient and Family   Emergency Contact Information         Contact Information     Name Relation Home Work [place], Nira Conn Daughter     [place], [place] Relative     [phone]       Current Medical History  Patient Admitting Diagnosis: Debility   History of Present Illness:  69 year old right-handed male with history of hypertension, hyperlipidemia, atrial fibrillation  maintained on Eliquis, CVA, diabetes mellitus, CKD stage III, CAD with cardiac arrest/pacemaker 2019 and resultant hypoxia with cognitive deficits  chronic left greater toe osteomyelitis with peripheral vascular disease, systolic congestive heart failure with ejection fraction of 40 to 45%.   Presented 01/11/2020 with altered mental status.  There was some question of unintentional drug overdose.  Cranial CT scan negative for acute process.  Sequela of remote left occipital and left cerebellar insults.  Chest x-ray mild streaky bibasilar opacities favoring atelectasis.  EEG negative for seizure.  Urine drug screen positive tricyclic, sodium 408, sedimentation rate 83 glucose 115, BUN 34, creatinine 1.59, total bilirubin 1.4, hemoglobin 12, lactic acid 1.2, BNP 316.  Most recent echocardiogram ejection fraction of 40 to 45% no wall motion abnormalities.  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 Caroline More after cardiac clearance was obtained as well as later undergoing arteriography/aortogram of left lower extremity per vascular surgery Dr. Leotis Pain showing relatively normal common femoral artery profunda femoris artery and superficial femoral artery.  The posterior tibial artery was chronically occluded with no distal reconstitution.Marland Kitchen  Hospital course psychiatry service was consulted for possible drug overdose as well as capacity evaluation and patient was deemed independent to make medical decisions and has been maintained on Haldol 1 mg twice daily as well as Zyprexa via 12.5 mg nightly.  Patient with bouts of urinary retention and initial voiding trial failed with Foley catheter tube remaining in place therapy evaluations completed patient is full weightbearing through left heel only after recent amputation with postoperative shoe.    Past Medical History      Past Medical History:  Diagnosis Date  . Allergy    . Atrial fibrillation  ([place])    . B12 deficiency    . Chronic kidney disease    . Chronic kidney disease (CKD), stage II (mild)    . Coronary atherosclerosis    . Diabetes mellitus without complication ([place])    . Elevated PSA    . Esophagitis    . GERD (gastroesophageal reflux disease)    . Heart attack ([place])    . Heart disease    . Hyperlipidemia    . Hypertension    . Hypokalemia    . Mild neurocognitive disorder    . Osteoarthritis    . Presbyopia    . PVD (peripheral vascular disease) ([place])    . Restless leg syndrome    . Stroke (cerebrum) ([place])    . Stroke ([place])    . Subacute osteomyelitis ([place])    . Tinea unguium    . Uncompensated short term memory deficit        Family History  family history includes Colon cancer in his mother; Diabetes in his mother; Heart failure in his mother; Thyroid disease in his sister.   Prior Rehab/Hospitalizations:  Has the patient had prior rehab or hospitalizations prior to admission? Yes   Has the patient had major surgery during 100 days prior to admission? Yes   Current Medications    Current Facility-Administered Medications:  .  0.9 %  sodium chloride infusion, , Intravenous, PRN, Algernon Huxley, MD, Last Rate: 10 mL/hr at 01/27/20 1711, 500 mL at 01/27/20 1711 .  acetaminophen (TYLENOL) tablet 650 mg, 650 mg, Oral, Q6H PRN, 650 mg at 01/18/20 1211 **OR** acetaminophen (TYLENOL) suppository 650 mg, 650 mg, Rectal, Q6H PRN, Lucky Cowboy, Erskine Squibb, MD .  amiodarone (PACERONE) tablet 400 mg, 400 mg, Oral, Daily, Neoma Laming A, MD, 400 mg at 02/03/20 0810 .  apixaban (ELIQUIS) tablet 5 mg, 5 mg, Oral, BID, Dallie Piles, RPH, 5 mg at 02/03/20 0809 .  ascorbic acid (VITAMIN C) tablet 500 mg, 500 mg, Oral, BID, Enzo Bi, MD, 500 mg at 02/03/20 0810 .  aspirin EC tablet 81 mg, 81 mg, Oral, Daily, Algernon Huxley, MD, 81 mg at 02/03/20 0810 .  atorvastatin (LIPITOR) tablet 40  mg, 40 mg, Oral, QHS, Algernon Huxley, MD, 40 mg at 02/02/20 2112 .  Chlorhexidine Gluconate Cloth  2 % PADS 6 each, 6 each, Topical, Daily, Enzo Bi, MD, 6 each at 02/02/20 1724 .  cholecalciferol (VITAMIN D3) tablet 1,000 Units, 1,000 Units, Oral, Daily, Algernon Huxley, MD, 1,000 Units at 02/03/20 0810 .  clopidogrel (PLAVIX) tablet 75 mg, 75 mg, Oral, Daily, Algernon Huxley, MD, 75 mg at 02/03/20 0810 .  DULoxetine (CYMBALTA) DR capsule 20 mg, 20 mg, Oral, Daily, Eulas Post, MD, 20 mg at 02/03/20 0809 .  famotidine (PEPCID) tablet 20 mg, 20 mg, Oral, Daily, Dew, Erskine Squibb, MD, 20 mg at 02/03/20 0809 .  feeding supplement (NEPRO CARB STEADY) liquid 237 mL, 237 mL, Oral, BID BM, Enzo Bi, MD, 237 mL at 02/03/20 8101 .  fluticasone (FLONASE) 50 MCG/ACT nasal spray 2 spray, 2 spray, Each Nare, Daily, Dew, Erskine Squibb, MD, 2 spray at 02/03/20 [phone] .  gabapentin (NEURONTIN) capsule 200 mg, 200 mg, Oral, TID, Eulas Post, MD, 200 mg at 02/03/20 0810 .  haloperidol (HALDOL) tablet 1 mg, 1 mg, Oral, BID, Eulas Post, MD, 1 mg at 02/03/20 0810 .  hydrALAZINE (APRESOLINE) injection 5 mg, 5 mg, Intravenous, Q2H PRN, Dew, Erskine Squibb, MD .  insulin aspart (novoLOG) injection 0-15 Units, 0-15 Units, Subcutaneous, TID AC & HS, Sharion Settler, NP, 3 Units at 02/03/20 [phone] .  insulin aspart (novoLOG) injection 6 Units, 6 Units, Subcutaneous, TID WC, Enzo Bi, MD, 6 Units at 02/03/20 [phone] .  insulin glargine (LANTUS) injection 10 Units, 10 Units, Subcutaneous, Daily, Sharen Hones, MD, 10 Units at 02/03/20 [phone] .  multivitamin with minerals tablet 1 tablet, 1 tablet, Oral, Daily, Enzo Bi, MD, 1 tablet at 02/03/20 0809 .  nitroGLYCERIN (NITROSTAT) SL tablet 0.4 mg, 0.4 mg, Sublingual, Q5 min PRN, Lucky Cowboy, Erskine Squibb, MD .  OLANZapine zydis (ZYPREXA) disintegrating tablet 12.5 mg, 12.5 mg, Oral, QHS, Eulas Post, MD, 12.5 mg at 02/02/20 2112 .  ondansetron (ZOFRAN) injection 4 mg, 4 mg, Intravenous, Q8H PRN, Algernon Huxley, MD, 4 mg at 02/02/20 2242 .  polyethylene glycol (MIRALAX / GLYCOLAX) packet 17 g, 17 g,  Oral, BID, Enzo Bi, MD, 17 g at 02/03/20 5361 .  sodium zirconium cyclosilicate (LOKELMA) packet 5 g, 5 g, Oral, BID, Enzo Bi, MD, 5 g at 02/03/20 4431 .  tamsulosin (FLOMAX) capsule 0.4 mg, 0.4 mg, Oral, Daily, Dew, Erskine Squibb, MD, 0.4 mg at 02/03/20 0809 .  vitamin B-12 (CYANOCOBALAMIN) tablet 1,000 mcg, 1,000 mcg, Oral, Daily, Enzo Bi, MD, 1,000 mcg at 02/03/20 0810   Patients Current Diet:     Diet Order                      DIET FINGER FOODS Room service appropriate? Yes; Fluid consistency: Thin  Diet effective now                      Precautions / Restrictions Precautions Precautions: Fall Precaution Comments: Suicide precautions Other Brace: postop shoe Restrictions Weight Bearing Restrictions: Yes RLE Weight Bearing: Weight bearing as tolerated LLE Weight Bearing: Weight bearing as tolerated LLE Partial Weight Bearing Percentage or Pounds: FWB through the L heel only with post op shoe donned    Has the patient had 2 or more falls or a fall with injury in the past year?No   Prior Activity Level Limited Community (1-2x/wk): Mod I with rw short distances  Prior Functional Level Prior Function Level of Independence: Needs assistance Comments: Assist for IADLs   Self Care: Did the patient need help bathing, dressing, using the toilet or eating?  Independent   Indoor Mobility: Did the patient need assistance with walking from room to room (with or without device)? Independent   Stairs: Did the patient need assistance with internal or external stairs (with or without device)? Needed some help   Functional Cognition: Did the patient need help planning regular tasks such as shopping or remembering to take medications? Needed some help   Home Assistive Devices / [place] Devices/Equipment: Gilford Rile (specify type) Home Equipment: Walker - 2 wheels   Prior Device Use: Indicate devices/aids used by the patient prior to current illness, exacerbation or  injury? Walker   Current Functional Level Cognition   Arousal/Alertness: Awake/alert Overall Cognitive Status: Within Functional Limits for tasks assessed Orientation Level: Oriented X4 Safety/Judgement: Decreased awareness of safety, Decreased awareness of deficits General Comments: Oriented to situation, requires MAX VCs for visual scanning Attention: Selective Selective Attention: Appears intact Memory: Appears intact Awareness: Appears intact Problem Solving: Appears intact Safety/Judgment: Appears intact    Extremity Assessment (includes Sensation/Coordination)   Upper Extremity Assessment: RUE deficits/detail RUE Deficits / Details: AAROM WFL, hx of CVA weakness on R side  Lower Extremity Assessment: Generalized weakness     ADLs   Overall ADL's : Needs assistance/impaired General ADL Comments: MAX A don L post-op shoe seated EOB, MOD A don R personal shoe (stuffed toes for balance). MIN A + RW tooth brushing standing sink side - pt required MAX VCs for visual scanning to locate toothpaste left of sink and single UE support for balance, tolerated ~2 mins standing prior to initating return to bed reporting fatigue.      Mobility   Overal bed mobility: Needs Assistance Bed Mobility: Supine to Sit, Sit to Supine Supine to sit: Modified independent (Device/Increase time) Sit to supine: Min assist General bed mobility comments: Min A for final positioning during sit to sup; extra time and effort with sup to sit     Transfers   Overall transfer level: Needs assistance Equipment used: Rolling walker (2 wheeled) Transfers: Sit to/from Stand Sit to Stand: Min assist, From elevated surface Stand pivot transfers: Max assist, From elevated surface General transfer comment: Min A to come to full upright standing and to prevent posterior LOB upon initial stand     Ambulation / Gait / Stairs / Wheelchair Mobility   Ambulation/Gait Ambulation/Gait assistance: Counsellor  (Feet): 5 Feet Assistive device: Rolling walker (2 wheeled) Gait Pattern/deviations: Trunk flexed, Drifts right/left, Decreased step length - right, Decreased step length - left, Step-to pattern General Gait Details: Mod to max verbal and visual cues/demonstration for step-to sequencing for WB through L heel only; pt c/o dizziness after limited amb and returned to sitting, see comments for orthostatic BP's Gait velocity: decreased     Posture / Balance Dynamic Sitting Balance Sitting balance - Comments: lateral/posterior LOBs c weight shifting/functional reach Balance Overall balance assessment: Needs assistance Sitting-balance support: No upper extremity supported, Feet supported Sitting balance-Leahy Scale: Fair Sitting balance - Comments: lateral/posterior LOBs c weight shifting/functional reach Standing balance support: Bilateral upper extremity supported, During functional activity Standing balance-Leahy Scale: Poor Standing balance comment: Min A to prevent posterior LOB upon initial stand     Special needs/care consideration Designated visitor on admit is daughter, Nira Conn 84 fr urethral catheter placed 01/22/2020 Sacrum medial stage 2 Left foot  incision Skin tear right hand    Previous Home Environment  Living Arrangements: Other (Comment) ([place] in boarding house. has own room; hallway bathroom 5o f)  Lives With: Alone Available Help at Discharge:  (Amy, who lives also in boarding house is paid to assist 2 to) Type of Home: Other(Comment) (boarding house with other renters. Own room but shared bathr) Home Layout: Multi-level, Able to live on main level with bedroom/bathroom (shared bathroom 50 feet down hall from his room) Alternate Level Stairs-Number of Steps: 6 to 8 stair entry per family Home Access: Stairs to enter Entrance Stairs-Rails: Left Entrance Stairs-Number of Steps: 6 to 8 steps entry per family Bathroom Shower/Tub: Gaffer, Architectural technologist:  Associate Professor Accessibility: Yes How Accessible: Accessible via walker [place]: Yes Type of [place]:  ([place] via [facility]; PCS aide via [place] [facility]) Additional Comments: PCA assists 2.5hrs/day with groceries, meal prep, laundry, etc...   Discharge Living Setting Plans for Discharge Living Setting: Other (Comment) (shared boarding house) Type of Home at Discharge:  (boarding house) Discharge Home Layout: Multi-level, Able to live on main level with bedroom/bathroom (shared bathroom down hall from his private room. 5o feet fro) Alternate Level Stairs-Rails: Left Discharge Home Access: Stairs to enter Entrance Stairs-Rails: Left, Right Entrance Stairs-Number of Steps: 6 to 8 steps per family; if unable to do stairs, daughter request ambulance home Discharge Bathroom Shower/Tub: Walk-in shower, Curtain Discharge Bathroom Toilet: Standard Discharge Bathroom Accessibility: Yes How Accessible: Accessible via walker Does the patient have any problems obtaining your medications?: No    Has own room in boarding house. Shares bathroom in hallway which is 50 feet from his room. [facility] and OT. Through the [place] has an aide through "[facility]" agency but they are unreliable. TOC, Colletta [place] has reported this to the [place] with Daryl to assist. Dr. Daneil Dolin is his [facility] MD. Family have been paying a person, Amy, who also lives in boarding house to assist a couple of hours per day.    Social/Family/Support Systems Patient Roles: Parent Contact Information: Donah Driver is daughter and legal guardian per patient Anticipated Caregiver: patient can not go to live with daughter due to his legal issues. (Has to return to boarding house with intermittent asisst) Anticipated Caregiver's Contact Information: [phone] Ability/Limitations of Caregiver: Patient can not go live with daughter due to legal issues Caregiver Availability:  Intermittent Discharge Plan Discussed with Primary Caregiver: Yes Is Caregiver In Agreement with Plan?: Yes Does Caregiver/Family have Issues with Lodging/Transportation while Pt is in Rehab?: No   Goals Patient/Family Goal for Rehab: Mod I with PT, OT, and SLP with intermittent assist Expected length of stay: ELOS 7 to 10 days Additional Information: Nira Conn has informed his Research officer, trade union of plan for CIR admit before return home Pt/Family Agrees to Admission and willing to participate: Yes Program Orientation Provided & Reviewed with Pt/Caregiver Including Roles  & Responsibilities: Yes Additional Information Needs: TOC team director will asisst with added aide services at home if requested from CIR  Barriers to Discharge:  (Patient unable to be placed at SNF due to legal issues)     Decrease burden of Care through IP rehab admission: n/a   Possible need for SNF placement upon discharge:Unable to be placed in SNF due to his legal issues   Patient Condition: This patient's medical and functional status has changed since the consult dated: 01/31/2020 in which the Rehabilitation Physician determined and documented  that the patient's condition is appropriate for intensive rehabilitative care in an inpatient rehabilitation facility. See "History of Present Illness" (above) for medical update. Functional changes are: min assist. Patient's medical and functional status update has been discussed with the Rehabilitation physician and patient remains appropriate for inpatient rehabilitation. Will admit to inpatient rehab today.   Preadmission Screen Completed By:  Cleatrice Burke, RN, 02/03/2020 9:48 AM ______________________________________________________________________   Discussed status with Dr. Ranell Patrick on 02/03/2020 at 0950   and received approval for admission today.   Admission Coordinator:  Cleatrice Burke, time 7711 Date 02/03/2020             Cosigned by: Izora Ribas, MD at 02/03/2020 10:17 AM  Revision History                          Note Details  Author Cristina Gong, RN File Time 02/03/2020  9:49 AM  Author Type Rehab Admission Coordinator Status Signed  Last Editor Cristina Gong, RN Service Physical Medicine and [place] # [id number] Admit Date 02/03/2020

## 2020-02-03 NOTE — Progress Notes (Signed)
Patient arrived from [place] via patient transport. Patient appears alert and denies pain.

## 2020-02-03 NOTE — TOC Transition Note (Signed)
Transition of Care [facility]) - CM/SW Discharge Note   Patient Details  Name: Jason Montes MRN: [id number] Date of Birth: [birth year]  [facility] CM/SW Contact:  Meriel Flavors, LCSW Phone Number: 02/03/2020, 12:01 PM   Clinical Narrative:    CSW spoke with Pamala Hurry at [facility] and patient has a bed there, discharging today .         Patient Goals and CMS Choice        Discharge Placement                       Discharge Plan and Services                                     Social Determinants of Health (SDOH) Interventions     Readmission Risk Interventions Readmission Risk Prevention Plan 01/17/2020  Medication Review ([place]) Complete  HRI or Home Care Consult Complete  Palliative Care Screening Not Applicable  Some recent data might be hidden

## 2020-02-03 NOTE — Progress Notes (Signed)
Izora Ribas, Jason Montes  Physician  Physical Medicine and Rehabilitation  Consult Note     Addendum  Date of Service:  01/31/2020  1:49 PM      Related encounter: ED to Hosp-Admission (Discharged) from 01/11/2020 in [place] All Collapse All  Show:Clear all [x] Manual[x] Template[] Copied  Added by: [x] Raulkar, Clide Deutscher, Jason Montes  [] Hover for details          Physical Medicine and Rehabilitation Consult Reason for Consult: Acute metabolic encephalopathy Referring Physician: Sharen Hones   HPI: Jason Montes is a 69 y.o. male who was admitted to [facility] with acute metabolic encephalopathy. He has a history of HTN, HLD, AB, stroke, GERD, depression, RLS, PVD, CAD, CKD3, atrial fibrillation on Eliquis, and chronic left greater toe osteomyelitis, CHF, who presented after unintentional overdose and underwent a toe amputation for his osteomyelitis and was treated for pneumonia. Today he was able to walk to the nursing station and back and he felt well. MinA. He would like to be able to do this more frequently. Denies pain. Does not have any suicidal ideation or depression anymore. Denies confusion.   Review of Systems  Constitutional: Negative for chills and fever.  HENT: Negative for hearing loss and tinnitus.   Eyes: Negative for blurred vision and double vision.  Respiratory: Negative for cough and hemoptysis.   Cardiovascular: Negative for chest pain and palpitations.  Gastrointestinal: Negative for heartburn and nausea.  Genitourinary: Negative for dysuria and urgency.  Musculoskeletal: Negative for myalgias and neck pain.  Skin: Negative for itching and rash.  Neurological: Negative for dizziness and tingling.  Endo/Heme/Allergies: Negative for environmental allergies. Does not bruise/bleed easily.  Psychiatric/Behavioral: Negative for depression and suicidal ideas.        Past Medical History:  Diagnosis Date  . Allergy    .  Atrial fibrillation ([place])    . B12 deficiency    . Chronic kidney disease    . Chronic kidney disease (CKD), stage II (mild)    . Coronary atherosclerosis    . Diabetes mellitus without complication ([place])    . Elevated PSA    . Esophagitis    . GERD (gastroesophageal reflux disease)    . Heart attack ([place])    . Heart disease    . Hyperlipidemia    . Hypertension    . Hypokalemia    . Mild neurocognitive disorder    . Osteoarthritis    . Presbyopia    . PVD (peripheral vascular disease) ([place])    . Restless leg syndrome    . Stroke (cerebrum) ([place])    . Stroke ([place])    . Subacute osteomyelitis ([place])    . Tinea unguium    . Uncompensated short term memory deficit           Past Surgical History:  Procedure Laterality Date  . AMPUTATION TOE Left 01/13/2020    Procedure: AMPUTATION RAY LEFT 1ST;  Surgeon: Caroline More, DPM;  Location: [facility] ORS;  Service: Podiatry;  Laterality: Left;  . CARDIAC PACEMAKER PLACEMENT      . COLONOSCOPY WITH PROPOFOL N/A 05/18/2018    Procedure: COLONOSCOPY WITH PROPOFOL;  Surgeon: [name], Jason Montes;  Location: [facility] ENDOSCOPY;  Service: Endoscopy;  Laterality: N/A;  . CORONARY ARTERY BYPASS GRAFT      . FOOT AMPUTATION Right    . INTRAMEDULLARY (IM) NAIL INTERTROCHANTERIC Right 11/16/2019    Procedure: INTRAMEDULLARY (IM) NAIL INTERTROCHANTRIC;  Surgeon: Thornton Park,  Jason Montes;  Location: [facility] ORS;  Service: Orthopedics;  Laterality: Right;  . LOWER EXTREMITY ANGIOGRAPHY Left 10/10/2019    Procedure: Lower Extremity Angiography;  Surgeon: Algernon Huxley, Jason Montes;  Location: [place] [facility];  Service: Cardiovascular;  Laterality: Left;  . LOWER EXTREMITY ANGIOGRAPHY Left 01/16/2020    Procedure: Lower Extremity Angiography;  Surgeon: Algernon Huxley, Jason Montes;  Location: [place] [facility];  Service: Cardiovascular;  Laterality: Left;         Family History  Problem Relation Age of Onset  . Heart failure Mother    . Colon cancer Mother    . Diabetes Mother    .  Thyroid disease Sister      Social History:  reports that he has never smoked. He has never used smokeless tobacco. He reports that he does not drink alcohol and does not use drugs. Allergies:       Allergies  Allergen Reactions  . Amlodipine Other (See Comments)  . Norvasc [Amlodipine Besylate]        Unknown  . Penicillins        Childhood allergy, not sure what happens           Medications Prior to Admission  Medication Sig Dispense Refill  . apixaban (ELIQUIS) 5 MG TABS tablet Take 5 mg by mouth 2 (two) times daily. (Patient not taking: Reported on 12/23/2019)      . aspirin EC 81 MG tablet Take 81 mg by mouth daily.      Marland Kitchen atorvastatin (LIPITOR) 40 MG tablet Take 40 mg by mouth at bedtime.       . betamethasone valerate (VALISONE) 0.1 % cream Apply 1 application topically 2 (two) times daily as needed. (Patient not taking: Reported on 12/23/2019)      . carvedilol (COREG) 6.25 MG tablet Take 3.125 mg by mouth 2 (two) times daily with a meal.       . cetirizine (ZYRTEC) 10 MG tablet Take 10 mg by mouth daily.      . Cholecalciferol (VITAMIN D3) 1000 units CAPS Take 1,000 Units by mouth daily.       . cyanocobalamin (,VITAMIN B-12,) 1000 MCG/ML injection Inject 1,000 mcg into the muscle every 30 (thirty) days. (Patient not taking: Reported on 12/23/2019)      . cyclobenzaprine (FLEXERIL) 10 MG tablet Take 10 mg by mouth 2 (two) times daily as needed for muscle spasms. (Patient not taking: Reported on 12/23/2019)      . docusate sodium (COLACE) 50 MG capsule Take 100 mg by mouth daily.  (Patient not taking: Reported on 12/23/2019)      . empagliflozin (JARDIANCE) 25 MG TABS tablet Take 12.5 mg by mouth daily.      . Ensure Max Protein (ENSURE MAX PROTEIN) LIQD Take 330 mLs (11 oz total) by mouth daily. (Patient not taking: Reported on 12/23/2019)      . famotidine (PEPCID) 20 MG tablet Take 20 mg by mouth daily.       . feeding supplement, GLUCERNA SHAKE, (GLUCERNA SHAKE) LIQD Take 237 mLs  by mouth 2 (two) times daily between meals. (Patient not taking: Reported on 12/23/2019)   0  . fluticasone (FLONASE) 50 MCG/ACT nasal spray Place 2 sprays into both nostrils daily.       Marland Kitchen gabapentin (NEURONTIN) 300 MG capsule Take 1 capsule (300 mg total) by mouth 2 (two) times daily. (Patient taking differently: Take 300-600 mg by mouth See admin instructions. Take 300 mg by mouth in  the morning and 600 mg at bedtime) 60 capsule 0  . glucose 4 GM chewable tablet Chew 4 tablets by mouth as needed for low blood sugar.      . HYDROcodone-acetaminophen (NORCO/VICODIN) 5-325 MG tablet Take 1-2 tablets by mouth every 6 (six) hours as needed for severe pain. (Patient not taking: Reported on 12/23/2019) 20 tablet 0  . lisinopril (ZESTRIL) 2.5 MG tablet Take 2.5 mg by mouth daily.      . metFORMIN (GLUCOPHAGE) 500 MG tablet Start 1 tab by mouth in am daily for 7 days. Then increase to 1 tab in am & pm for 7 days.  Continue weekly increase to 2 tabs twice daily. (Patient taking differently: Take 500 mg by mouth 2 (two) times daily. ) 120 tablet 3  . mirtazapine (REMERON) 15 MG tablet Take 15 mg by mouth at bedtime.      . nitroGLYCERIN (NITROSTAT) 0.4 MG SL tablet Place 0.4 mg under the tongue every 5 (five) minutes as needed for chest pain.      . Semaglutide, 1 MG/DOSE, 2 MG/1.5ML SOPN Inject 1 mg into the skin once a week.       . senna (SENOKOT) 8.6 MG TABS tablet Take 1 tablet (8.6 mg total) by mouth 2 (two) times daily. (Patient taking differently: Take 2 tablets by mouth in the morning and at bedtime. ) 120 tablet 0  . tamsulosin (FLOMAX) 0.4 MG CAPS capsule Take 0.4 mg by mouth daily.       Marland Kitchen torsemide (DEMADEX) 20 MG tablet Take 20 mg by mouth daily.       . traMADol (ULTRAM) 50 MG tablet Take 1 tablet (50 mg total) by mouth every 6 (six) hours. (Patient not taking: Reported on 12/23/2019) 20 tablet 0      Home: Home Living Family/patient expects to be discharged to:: Other (Comment) Living  Arrangements: Alone Available Help at Discharge: Personal care attendant, Available PRN/intermittently (PCA assists 2.5 hrs/day c cooking/cleaning/groceries) Home Equipment: Walker - 2 wheels  Functional History: Prior Function Level of Independence: Needs assistance Comments: Assist for IADLs Functional Status:  Mobility: Bed Mobility Overal bed mobility: Needs Assistance Bed Mobility: Supine to Sit Supine to sit: Min assist Sit to supine: Mod assist General bed mobility comments: Min A to come to full upright sitting position Transfers Overall transfer level: Needs assistance Equipment used: Rolling walker (2 wheeled) Transfers: Sit to/from Stand Sit to Stand: Min assist Stand pivot transfers: Max assist, From elevated surface General transfer comment: Min A to come to full upright standing and to prevent posterior LOB upon initial stand Ambulation/Gait Ambulation/Gait assistance: Min guard Gait Distance (Feet): 25 Feet Assistive device: Rolling walker (2 wheeled) Gait Pattern/deviations: Step-through pattern, Trunk flexed, Drifts right/left, Decreased step length - right, Decreased step length - left General Gait Details: Encouraged increased amb distance but pt unable to amb > 25 feet this session; slow cadence but steady without LOB, SpO2 >/= 92% on room air with HR WNL Gait velocity: decreased   ADL: ADL Overall ADL's : Needs assistance/impaired General ADL Comments: MAX A don L post-op shoe seated EOB, MOD A don R personal shoe (stuffed toes for balance). MIN A + RW tooth brushing standing sink side - pt required MAX VCs for visual scanning to locate toothpaste left of sink and single UE support for balance, tolerated ~2 mins standing prior to initating return to bed reporting fatigue.    Cognition: Cognition Overall Cognitive Status: History of cognitive impairments - at  baseline Orientation Level: Oriented X4 Cognition Arousal/Alertness: Awake/alert Behavior During  Therapy: WFL for tasks assessed/performed Overall Cognitive Status: History of cognitive impairments - at baseline Area of Impairment: Orientation, Memory, Awareness, Safety/judgement, Problem solving Orientation Level: Disoriented to, Place, Time, Situation Memory: Decreased recall of precautions, Decreased short-term memory Safety/Judgement: Decreased awareness of safety, Decreased awareness of deficits Awareness: Intellectual Problem Solving: Slow processing, Decreased initiation, Requires verbal cues, Requires tactile cues, Difficulty sequencing General Comments: Oriented to situation, requires MAX VCs for visual scanning   Blood pressure 117/64, pulse 82, temperature 97.6 F (36.4 C), temperature source Oral, resp. rate 18, height 5\' 9"  (1.753 m), weight 74.5 kg, SpO2 93 %. Physical Exam  General: Alert and oriented x 3, No apparent distress HEENT: Head is normocephalic, atraumatic, PERRLA, EOMI, sclera anicteric, oral mucosa pink and moist, dentition intact, ext ear canals clear,  Neck: Supple without JVD or lymphadenopathy Heart: Reg rate and rhythm. No murmurs rubs or gallops Chest: CTA bilaterally without wheezes, rales, or rhonchi; no distress Abdomen: Soft, non-tender, non-distended, bowel sounds positive. Extremities: No clubbing, cyanosis, or edema. Pulses are 2+ Skin: Clean and intact without signs of breakdown Neuro: Pt is cognitively appropriate with normal insight, memory, and awareness. Able to answer questions regarding his history accurately. Able to spell WORLD backwards. Cranial nerves 2-12 are intact. Sensory exam is normal. Reflexes are 2+ in all 4's. Fine motor coordination is intact. No tremors. Motor function is grossly 5/5, left foot not tested given recent amputation.  Musculoskeletal: Left foot in boot.  Psych: Pt's affect is appropriate. Pt is cooperative GU: foley in place   Lab Results Last 24 Hours       Results for orders placed or performed during the  hospital encounter of 01/11/20 (from the past 24 hour(s))  Glucose, capillary     Status: Abnormal    Collection Time: 01/30/20  4:37 PM  Result Value Ref Range    Glucose-Capillary 257 (H) 70 - 99 mg/dL  Glucose, capillary     Status: Abnormal    Collection Time: 01/30/20  9:01 PM  Result Value Ref Range    Glucose-Capillary 225 (H) 70 - 99 mg/dL  Glucose, capillary     Status: Abnormal    Collection Time: 01/31/20  8:03 AM  Result Value Ref Range    Glucose-Capillary 210 (H) 70 - 99 mg/dL  Glucose, capillary     Status: Abnormal    Collection Time: 01/31/20 11:51 AM  Result Value Ref Range    Glucose-Capillary 229 (H) 70 - 99 mg/dL      Imaging Results (Last 48 hours)  No results found.       Assessment/Plan: Diagnosis: Acute metabolic encephalopathy 1. Does the need for close, 24 hr/day medical supervision in concert with the patient's rehab needs make it unreasonable for this patient to be served in a less intensive setting? Yes 2. Co-Morbidities requiring supervision/potential complications: suicidal ideation, overdose, HTN, HLD, atrial fibrillation, stage 3a chronic kidney disease, osteomyelitis of left great toe 3. Due to bladder management, bowel management, safety, skin/wound care, disease management, medication administration, pain management and patient education, does the patient require 24 hr/day rehab nursing? Yes 4. Does the patient require coordinated care of a physician, rehab nurse, therapy disciplines of PT, OT to address physical and functional deficits in the context of the above medical diagnosis(es)? Yes Addressing deficits in the following areas: balance, endurance, locomotion, strength, transferring, bowel/bladder control, bathing, dressing, feeding, grooming, toileting, cognition and psychosocial support 5. Can  the patient actively participate in an intensive therapy program of at least 3 hrs of therapy per day at least 5 days per week? Yes 6. The potential  for patient to make measurable gains while on inpatient rehab is good 7. Anticipated functional outcomes upon discharge from inpatient rehab are modified independent  with PT, modified independent with OT, independent with SLP. 8. Estimated rehab length of stay to reach the above functional goals is: 7-8 days 9. Anticipated discharge destination: Home 10. Overall Rehab/Functional Prognosis: good   RECOMMENDATIONS: This patient's condition is appropriate for continued rehabilitative care in the following setting: CIR Patient has agreed to participate in recommended program. Yes Note that insurance prior authorization may be required for reimbursement for recommended care.   Comment:  Jason Montes would be a good CIR candidate. He has no family support so goals are modI. He has slow processing but is well oriented and able to answer questions about his history well. He would benefit from SLP eval to assess for higher cognitive deficits to help determine whether he can safely return home modI (I will place order).   Addendum: Thanks to Happi SLP for seeing patient and for her thorough eval: at baseline patient mostly is ambulatory within his room so he is close to baseline function and cognition and may be safely discharged home with home services.    Thank you for this consult. Admission coordinator to follow.    Jason Cha, Jason Montes   Izora Ribas, Jason Montes 01/31/2020    Revision History                     Routing History           Note Details  Author Izora Ribas, Jason Montes File Time 02/01/2020 10:05 AM  Author Type Physician Status Addendum  Last Editor Izora Ribas, Jason Montes Service Physical Medicine and [place] # [id number] [place] Date 02/03/2020

## 2020-02-03 NOTE — H&P (Addendum)
Physical Medicine and Rehabilitation Admission H&P  HPI: Jason Montes is a 69 year old right-handed male with history of hypertension, hyperlipidemia, atrial fibrillation maintained on Eliquis, CVA, diabetes mellitus, CKD stage III, CAD with cardiac arrest/pacemaker 2019 and resultant hypoxia with cognitive deficits  chronic left greater toe osteomyelitis with peripheral vascular disease, systolic congestive heart failure with ejection fraction of 40 to 45%.  Per chart review patient lives in a boardinghouse.  He has a daughter in the area.  Plan is return back to boarding home.  Presented 01/11/2020 with altered mental status.  There was some question of unintentional drug overdose.  Cranial CT scan negative for acute process.  Sequela of remote left occipital and left cerebellar insults.  Chest x-ray mild streaky bibasilar opacities favoring atelectasis.  EEG negative for seizure.  Urine drug screen positive tricyclic, sodium 941, sedimentation rate 83 glucose 115, BUN 34, creatinine 1.59, total bilirubin 1.4, hemoglobin 12, lactic acid 1.2, BNP 316.  Most recent echocardiogram ejection fraction of 40 to 45% no wall motion abnormalities.  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 Caroline More after cardiac clearance was obtained as well as later undergoing arteriography/aortogram of left lower extremity per vascular surgery Dr. Leotis Pain showing relatively normal common femoral artery profunda femoris artery and superficial femoral artery.  The posterior tibial artery was chronically occluded with no distal reconstitution.Marland Kitchen  Hospital course psychiatry service was consulted for possible drug overdose as well as capacity evaluation and patient was deemed independent to make medical decisions and has been maintained on Haldol 1 mg twice daily as well as Zyprexa via 12.5 mg nightly.  Patient with bouts of urinary retention and  initial voiding trial failed with Foley catheter tube remaining in place therapy evaluations completed patient is full weightbearing through left heel only after recent amputation with postoperative shoe.  Patient was admitted for a comprehensive rehab program.  Review of Systems  Constitutional: Positive for malaise/fatigue. Negative for chills and fever.  HENT: Negative for hearing loss.   Eyes: Negative for blurred vision and double vision.  Respiratory: Positive for shortness of breath. Negative for cough.   Cardiovascular: Positive for palpitations and leg swelling. Negative for chest pain.  Gastrointestinal: Negative for constipation, heartburn and nausea.       GERD  Genitourinary: Positive for urgency. Negative for dysuria, flank pain and hematuria.  Musculoskeletal: Positive for joint pain and myalgias.  Skin: Negative for rash.  Psychiatric/Behavioral: Positive for depression. The patient has insomnia.   All other systems reviewed and are negative.  Past Medical History:  Diagnosis Date  . Allergy   . Atrial fibrillation ([place])   . B12 deficiency   . Chronic kidney disease   . Chronic kidney disease (CKD), stage II (mild)   . Coronary atherosclerosis   . Diabetes mellitus without complication ([place])   . Elevated PSA   . Esophagitis   . GERD (gastroesophageal reflux disease)   . Heart attack ([place])   . Heart disease   . Hyperlipidemia   . Hypertension   . Hypokalemia   . Mild neurocognitive disorder   . Osteoarthritis   . Presbyopia   . PVD (peripheral vascular disease) ([place])   . Restless leg syndrome   . Stroke (cerebrum) ([place])   . Stroke ([place])   . Subacute osteomyelitis ([place])   . Tinea unguium   . Uncompensated short term memory deficit    Past Surgical History:  Procedure Laterality  Date  . AMPUTATION TOE Left 01/13/2020   Procedure: AMPUTATION RAY LEFT 1ST;  Surgeon: Caroline More, DPM;  Location: [facility] ORS;  Service: Podiatry;  Laterality: Left;  . CARDIAC  PACEMAKER PLACEMENT    . COLONOSCOPY WITH PROPOFOL N/A 05/18/2018   Procedure: COLONOSCOPY WITH PROPOFOL;  Surgeon: [name], MD;  Location: [facility] ENDOSCOPY;  Service: Endoscopy;  Laterality: N/A;  . CORONARY ARTERY BYPASS GRAFT    . FOOT AMPUTATION Right   . INTRAMEDULLARY (IM) NAIL INTERTROCHANTERIC Right 11/16/2019   Procedure: INTRAMEDULLARY (IM) NAIL INTERTROCHANTRIC;  Surgeon: Thornton Park, MD;  Location: [facility] ORS;  Service: Orthopedics;  Laterality: Right;  . LOWER EXTREMITY ANGIOGRAPHY Left 10/10/2019   Procedure: Lower Extremity Angiography;  Surgeon: Algernon Huxley, MD;  Location: [facility];  Service: Cardiovascular;  Laterality: Left;  . LOWER EXTREMITY ANGIOGRAPHY Left 01/16/2020   Procedure: Lower Extremity Angiography;  Surgeon: Algernon Huxley, MD;  Location: [facility];  Service: Cardiovascular;  Laterality: Left;   Family History  Problem Relation Age of Onset  . Heart failure Mother   . Colon cancer Mother   . Diabetes Mother   . Thyroid disease Sister    Social History:  reports that he has never smoked. He has never used smokeless tobacco. He reports that he does not drink alcohol and does not use drugs. Allergies:  Allergies  Allergen Reactions  . Amlodipine Other (See Comments)  . Norvasc [Amlodipine Besylate]     Unknown  . Penicillins     Childhood allergy, not sure what happens    Medications Prior to Admission  Medication Sig Dispense Refill  . amiodarone (PACERONE) 400 MG tablet Take 0.5 tablets (200 mg total) by mouth daily. 30 tablet 0  . apixaban (ELIQUIS) 5 MG TABS tablet Take 5 mg by mouth 2 (two) times daily. (Patient not taking: Reported on 12/23/2019)    . aspirin EC 81 MG tablet Take 81 mg by mouth daily.    Marland Kitchen atorvastatin (LIPITOR) 40 MG tablet Take 40 mg by mouth at bedtime.     . Cholecalciferol (VITAMIN D3) 1000 units CAPS Take 1,000 Units by mouth daily.     . cyanocobalamin (,VITAMIN B-12,) 1000 MCG/ML injection Inject  1,000 mcg into the muscle every 30 (thirty) days. (Patient not taking: Reported on 12/23/2019)    . DULoxetine (CYMBALTA) 20 MG capsule Take 1 capsule (20 mg total) by mouth daily. 30 capsule 0  . empagliflozin (JARDIANCE) 25 MG TABS tablet Take 12.5 mg by mouth daily.    . famotidine (PEPCID) 20 MG tablet Take 20 mg by mouth daily.     . feeding supplement, GLUCERNA SHAKE, (GLUCERNA SHAKE) LIQD Take 237 mLs by mouth 2 (two) times daily between meals. (Patient not taking: Reported on 12/23/2019)  0  . fluticasone (FLONASE) 50 MCG/ACT nasal spray Place 2 sprays into both nostrils daily.     Marland Kitchen gabapentin (NEURONTIN) 300 MG capsule Take 1 capsule (300 mg total) by mouth 3 (three) times daily. 60 capsule 0  . glucose 4 GM chewable tablet Chew 4 tablets by mouth as needed for low blood sugar.    . haloperidol (HALDOL) 1 MG tablet Take 1 tablet (1 mg total) by mouth 2 (two) times daily. 14 tablet 0  . insulin aspart (NOVOLOG) 100 UNIT/ML injection Inject 6 Units into the skin 3 (three) times daily with meals. 10 mL 11  . [START ON 02/04/2020] insulin glargine (LANTUS) 100 UNIT/ML injection Inject 0.1 mLs (10  Units total) into the skin daily. 10 mL 11  . metFORMIN (GLUCOPHAGE) 500 MG tablet Start 1 tab by mouth in am daily for 7 days. Then increase to 1 tab in am & pm for 7 days.  Continue weekly increase to 2 tabs twice daily. (Patient taking differently: Take 500 mg by mouth 2 (two) times daily. ) 120 tablet 3  . nitroGLYCERIN (NITROSTAT) 0.4 MG SL tablet Place 0.4 mg under the tongue every 5 (five) minutes as needed for chest pain.    Marland Kitchen olanzapine zydis (ZYPREXA) 15 MG disintegrating tablet Take 1 tablet (15 mg total) by mouth at bedtime. 14 tablet 0  . polyethylene glycol (MIRALAX / GLYCOLAX) 17 g packet Take 17 g by mouth 2 (two) times daily. 14 each 0  . Semaglutide, 1 MG/DOSE, 2 MG/1.5ML SOPN Inject 1 mg into the skin once a week.     . tamsulosin (FLOMAX) 0.4 MG CAPS capsule Take 0.4 mg by mouth daily.      Marland Kitchen thiamine 100 MG tablet Take 1 tablet (100 mg total) by mouth daily. 30 tablet 0    Drug Regimen Review Drug regimen was reviewed and remains appropriate with no significant issues identified  Home: Home Living Family/patient expects to be discharged to:: Other (Comment) Living Arrangements: Other (Comment) (alons in boarding house. has own room; hallway bathroom 5o f) Available Help at Discharge:  (Amy, who lives also in boarding house is paid to assist 2 to) Type of Home: Other(Comment) (boarding house with other renters. Own room but shared bathr) Home Access: Stairs to enter Entrance Stairs-Number of Steps: 6 to 8 steps entry per family Entrance Stairs-Rails: Left Home Layout: Multi-level, Able to live on main level with bedroom/bathroom (shared bathroom 50 feet down hall from his room) Alternate Level Stairs-Number of Steps: 6 to 8 stair entry per family Bathroom Shower/Tub: Gaffer, Curtain Bathroom Toilet: Standard Bathroom Accessibility: Yes Home Equipment: Environmental consultant - 2 wheels Additional Comments: PCA assists 2.5hrs/day with groceries, meal prep, laundry, etc...  Lives With: Alone (numerous other people live in the boarding house in [place] )   Functional History: Prior Function Level of Independence: Needs assistance Comments: Assist for IADLs  Functional Status:  Mobility: Bed Mobility Overal bed mobility: Needs Assistance Bed Mobility: Supine to Sit, Sit to Supine Supine to sit: Modified independent (Device/Increase time) Sit to supine: Min assist General bed mobility comments: Min A for final positioning during sit to sup; extra time and effort with sup to sit Transfers Overall transfer level: Needs assistance Equipment used: Rolling walker (2 wheeled) Transfers: Sit to/from Stand Sit to Stand: Min assist, From elevated surface Stand pivot transfers: Max assist, From elevated surface General transfer comment: Min A to come to full upright standing and  to prevent posterior LOB upon initial stand Ambulation/Gait Ambulation/Gait assistance: Min guard Gait Distance (Feet): 5 Feet Assistive device: Rolling walker (2 wheeled) Gait Pattern/deviations: Trunk flexed, Drifts right/left, Decreased step length - right, Decreased step length - left, Step-to pattern General Gait Details: Mod to max verbal and visual cues/demonstration for step-to sequencing for WB through L heel only; pt c/o dizziness after limited amb and returned to sitting, see comments for orthostatic BP's Gait velocity: decreased  ADL: ADL Overall ADL's : Needs assistance/impaired General ADL Comments: MAX A don L post-op shoe seated EOB, MOD A don R personal shoe (stuffed toes for balance). MIN A + RW tooth brushing standing sink side - pt required MAX VCs for visual scanning to locate toothpaste  left of sink and single UE support for balance, tolerated ~2 mins standing prior to initating return to bed reporting fatigue.   Cognition: Cognition Overall Cognitive Status: Within Functional Limits for tasks assessed Arousal/Alertness: Awake/alert Orientation Level: Oriented X4 Attention: Selective Selective Attention: Appears intact Memory: Appears intact Awareness: Appears intact Problem Solving: Appears intact Safety/Judgment: Appears intact Cognition Arousal/Alertness: Awake/alert Behavior During Therapy: WFL for tasks assessed/performed Overall Cognitive Status: Within Functional Limits for tasks assessed Area of Impairment: Orientation, Memory, Awareness, Safety/judgement, Problem solving Orientation Level: Disoriented to, Place, Time, Situation Memory: Decreased recall of precautions, Decreased short-term memory Safety/Judgement: Decreased awareness of safety, Decreased awareness of deficits Awareness: Intellectual Problem Solving: Slow processing, Decreased initiation, Requires verbal cues, Requires tactile cues, Difficulty sequencing General Comments: Oriented to  situation, requires MAX VCs for visual scanning   Physical Exam: Blood pressure (!) 118/58, pulse 78, temperature 97.9 F (36.6 C), resp. rate 17, SpO2 95 %. Physical Exam General: Alert and oriented x 3, No apparent distress HEENT: Head is normocephalic, atraumatic, PERRLA, EOMI, sclera anicteric, oral mucosa pink and moist, dentition intact, ext ear canals clear,  Neck: Supple without JVD or lymphadenopathy Heart: Reg rate and rhythm. No murmurs rubs or gallops Chest: CTA bilaterally without wheezes, rales, or rhonchi; no distress Abdomen: Soft, non-tender, non-distended, bowel sounds positive. Extremities: No clubbing, cyanosis, or edema. Pulses are 2+ Skin: Left foot dressing in place. R foot with healed amputation Neuro: Pt is cognitively appropriate with normal insight, memory, and awareness. Cranial nerves 2-12 are intact.  Patient is alert in no acute distress.  Oriented to person and place as well as following simple commands.  Limited but fair medical historian.  Psych: Pt's affect is appropriate. Pt is cooperative  Results for orders placed or performed during the hospital encounter of 01/11/20 (from the past 48 hour(s))  Glucose, capillary     Status: Abnormal   Collection Time: 02/01/20  4:36 PM  Result Value Ref Range   Glucose-Capillary 225 (H) 70 - 99 mg/dL    Comment: Glucose reference range applies only to samples taken after fasting for at least 8 hours.   Comment 1 Notify RN   Glucose, capillary     Status: Abnormal   Collection Time: 02/01/20  9:19 PM  Result Value Ref Range   Glucose-Capillary 220 (H) 70 - 99 mg/dL    Comment: Glucose reference range applies only to samples taken after fasting for at least 8 hours.  Basic metabolic panel     Status: Abnormal   Collection Time: 02/02/20  4:47 AM  Result Value Ref Range   Sodium 131 (L) 135 - 145 mmol/L   Potassium 5.6 (H) 3.5 - 5.1 mmol/L   Chloride 95 (L) 98 - 111 mmol/L   CO2 30 22 - 32 mmol/L   Glucose,  Bld 197 (H) 70 - 99 mg/dL    Comment: Glucose reference range applies only to samples taken after fasting for at least 8 hours.   BUN 31 (H) 8 - 23 mg/dL   Creatinine, Ser 1.38 (H) 0.61 - 1.24 mg/dL   Calcium 9.4 8.9 - 10.3 mg/dL   GFR calc non Af Amer 53 (L) >60 mL/min   GFR calc Af Amer >60 >60 mL/min   Anion gap 6 5 - 15    Comment: Performed at [facility], [address], [place], [place] [address]  CBC     Status: Abnormal   Collection Time: 02/02/20  4:47 AM  Result Value Ref  Range   WBC 9.9 4.0 - 10.5 K/uL   RBC 3.14 (L) 4.22 - 5.81 MIL/uL   Hemoglobin 8.9 (L) 13.0 - 17.0 g/dL   HCT 26.5 (L) 39 - 52 %   MCV 84.4 80.0 - 100.0 fL   MCH 28.3 26.0 - 34.0 pg   MCHC 33.6 30.0 - 36.0 g/dL   RDW 16.0 (H) 11.5 - 15.5 %   Platelets 279 150 - 400 K/uL    Comment: PLATELET COUNT PERFORMED ON CITRATED BLOOD Immature Platelet Fraction may be clinically indicated, consider ordering this additional test [id number] CONSISTENT WITH PREVIOUS RESULT    nRBC 0.0 0.0 - 0.2 %    Comment: Performed at [facility], [address] [place], [place] [address]  Magnesium     Status: None   Collection Time: 02/02/20  4:47 AM  Result Value Ref Range   Magnesium 2.3 1.7 - 2.4 mg/dL    Comment: Performed at [facility], [address] [place], [place] [address]  Glucose, capillary     Status: Abnormal   Collection Time: 02/02/20  7:32 AM  Result Value Ref Range   Glucose-Capillary 175 (H) 70 - 99 mg/dL    Comment: Glucose reference range applies only to samples taken after fasting for at least 8 hours.  Glucose, capillary     Status: Abnormal   Collection Time: 02/02/20 11:36 AM  Result Value Ref Range   Glucose-Capillary 271 (H) 70 - 99 mg/dL    Comment: Glucose reference range applies only to samples taken after fasting for at least 8 hours.  Glucose, capillary     Status: Abnormal   Collection Time: 02/02/20  4:19 PM  Result Value Ref Range    Glucose-Capillary 270 (H) 70 - 99 mg/dL    Comment: Glucose reference range applies only to samples taken after fasting for at least 8 hours.  Glucose, capillary     Status: Abnormal   Collection Time: 02/02/20  9:00 PM  Result Value Ref Range   Glucose-Capillary 202 (H) 70 - 99 mg/dL    Comment: Glucose reference range applies only to samples taken after fasting for at least 8 hours.  Basic metabolic panel     Status: Abnormal   Collection Time: 02/03/20  5:05 AM  Result Value Ref Range   Sodium 132 (L) 135 - 145 mmol/L   Potassium 4.9 3.5 - 5.1 mmol/L   Chloride 98 98 - 111 mmol/L   CO2 26 22 - 32 mmol/L   Glucose, Bld 133 (H) 70 - 99 mg/dL    Comment: Glucose reference range applies only to samples taken after fasting for at least 8 hours.   BUN 32 (H) 8 - 23 mg/dL   Creatinine, Ser 1.43 (H) 0.61 - 1.24 mg/dL   Calcium 9.5 8.9 - 10.3 mg/dL   GFR calc non Af Amer 50 (L) >60 mL/min   GFR calc Af Amer 58 (L) >60 mL/min   Anion gap 8 5 - 15    Comment: Performed at [facility], [address] [place], [place] [address]  CBC     Status: Abnormal   Collection Time: 02/03/20  5:05 AM  Result Value Ref Range   WBC 6.0 4.0 - 10.5 K/uL    Comment: REPEATED TO VERIFY   RBC 3.62 (L) 4.22 - 5.81 MIL/uL   Hemoglobin 10.5 (L) 13.0 - 17.0 g/dL   HCT 31.3 (L) 39 - 52 %   MCV 86.5 80.0 -  100.0 fL   MCH 29.0 26.0 - 34.0 pg   MCHC 33.5 30.0 - 36.0 g/dL   RDW 16.1 (H) 11.5 - 15.5 %   Platelets 130 (L) 150 - 400 K/uL    Comment: PLATELET COUNT PERFORMED ON CITRATED BLOOD   nRBC 0.0 0.0 - 0.2 %    Comment: Performed at [facility], [address], [place], [place] [address]  Magnesium     Status: Abnormal   Collection Time: 02/03/20  5:05 AM  Result Value Ref Range   Magnesium 2.7 (H) 1.7 - 2.4 mg/dL    Comment: Performed at [facility], [address] [place], [place] [address]  Glucose, capillary     Status: Abnormal   Collection Time: 02/03/20  7:43 AM    Result Value Ref Range   Glucose-Capillary 160 (H) 70 - 99 mg/dL    Comment: Glucose reference range applies only to samples taken after fasting for at least 8 hours.  Glucose, capillary     Status: Abnormal   Collection Time: 02/03/20 11:34 AM  Result Value Ref Range   Glucose-Capillary 183 (H) 70 - 99 mg/dL    Comment: Glucose reference range applies only to samples taken after fasting for at least 8 hours.   No results found.     Medical Problem List and Plan: 1.  Altered mental status secondary to acute metabolic encephalopathy related to possible drug overdose as well as history of multiple CVAs/vascular dementia  -patient may sponge bathe due to LLE surgery  -ELOS/Goals: modI 7-8 days  -Admit to CIR 2.  Antithrombotics: -DVT/anticoagulation: Eliquis  -antiplatelet therapy: Aspirin 81 mg daily, Plavix 75 mg daily 3. Pain Management: Neurontin 200 mg 3 times daily, Cymbalta 20 mg daily 4. Mood: Provide emotional support  -antipsychotic agents: Haldol 1 mg twice daily, Zyprexa 12.5 mg nightly 5. Neuropsych: This patient is capable of making decisions on his own behalf. 6. Skin/Wound Care: Routine skin checks 7. Fluids/Electrolytes/Nutrition: Routine in and outs with follow-up chemistries 8.  Atrial fibrillation.  Amiodarone 400 mg daily.  Cardiac rate controlled 9.  Chronic left hallux osteomyelitis.  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 Caroline More.  Weightbearing as tolerated through left heel only with postoperative shoe 10.  Hypertension.  Monitor with increased mobility. Well controlled.  11.  Diabetes mellitus with peripheral neuropathy.  Hemoglobin A1c 7.9.  NovoLog 6 units 3 times daily with meals, Lantus insulin 10 units daily.  Diabetic teaching. Uncontrolled. Increase Lantus to 11 U 12.  CKD stage III as well as bouts of hyperkalemia.  Admission creatinine 1.59.  Continue Lokelma 5 mg twice daily, follow-up chemistries 13.  Hyperlipidemia.   Lipitor 14.  CAD with history of pacemaker.  Continue aspirin 15.  Chronic systolic congestive heart failure.  Latest ejection fraction 40 to 45%.  Monitor for any signs of fluid overload 16.  Urinary retention.  Trial of void   Helyn Numbers, PA-C  I have personally performed a face to face diagnostic evaluation, including, but not limited to relevant history and physical exam findings, of this patient and developed relevant assessment and plan.  Additionally, I have reviewed and concur with the physician assistant's documentation above.  The patient's status has not changed. The original post admission physician evaluation remains appropriate, and any changes from the pre-admission screening or documentation from the acute chart are noted above.   Leeroy Cha, MD

## 2020-02-03 NOTE — Progress Notes (Signed)
Inpatient Rehabilitation Medication Review by a Pharmacist  A complete drug regimen review was completed for this patient to identify any potential clinically significant medication issues.  Clinically significant medication issues were identified:  yes   Type of Medication Issue Identified Description of Issue Urgent (address now) Non-Urgent (address on AM team rounds) Plan Plan Accepted by Provider? (Yes / No / Pending AM Rounds)  Drug Interaction(s) (clinically significant)       Duplicate Therapy  Patient on triple therapy (ASA, plavix and apixaban) at [facility], but plavix not included on d/c summary urgent D/w PA ([place]). Will hold plavix for now.  Yes  Allergy       No Medication Administration End Date       Incorrect Dose  Amiodarone 400mg  daily during acute admit, but 200mg  daily on d/c summary Non-urgent Continue dose from acute admit yes  Additional Drug Therapy Needed  Empaglifozin on d/c summary, but not on during acute admit Non-urgent D/w PA. Will hold for now yes  Other         Name of provider notified for urgent issues identified: Marlowe [place], PA  Provider Method of Notification: Phone   For non-urgent medication issues to be resolved on team rounds tomorrow morning a [facility] was sent to:    Pharmacist comments:   Time spent performing this drug regimen review (minutes):  15  Erickson Yamashiro A. Levada Dy, PharmD, BCPS, FNKF Clinical Pharmacist [place] Please utilize Amion for appropriate phone number to reach the unit pharmacist ([place])  02/03/2020 1:48 PM

## 2020-02-03 NOTE — Progress Notes (Signed)
Inpatient Rehabilitation  Patient information reviewed and entered into eRehab system by Ponciano Shealy M. Kaeya Schiffer, M.A., CCC/SLP, PPS Coordinator.  Information including medical coding, functional ability and quality indicators will be reviewed and updated through discharge.    

## 2020-02-03 NOTE — Plan of Care (Signed)
Patient discharging to SNF today, report to be given to nurse at receiving facility.

## 2020-02-03 NOTE — Progress Notes (Addendum)
Inpatient Rehabilitation Admissions Coordinator  CIR bed is available to admit patient to today. He is in agreement. I have contacted Dr. Billie Ruddy, acute team and TOC. I have spoken with RN, Claiborne Billings and will make the arrangements to admit today. I will call [facility] transport for pickup once CIR bed is available this morning.  Danne Baxter, RN, MSN Rehab Admissions Coordinator [phone] 02/03/2020 9:51 AM   Patient to be admitted to [facility] Inpatient acute rehab room [phone] in [place] by Dr. Naaman Plummer.

## 2020-02-04 ENCOUNTER — Inpatient Hospital Stay (HOSPITAL_COMMUNITY): Payer: Medicare Other | Admitting: Speech Pathology

## 2020-02-04 ENCOUNTER — Inpatient Hospital Stay (HOSPITAL_COMMUNITY): Payer: Medicare Other | Admitting: Physical Therapy

## 2020-02-04 DIAGNOSIS — G9341 Metabolic encephalopathy: Secondary | ICD-10-CM

## 2020-02-04 LAB — GLUCOSE, CAPILLARY
Glucose-Capillary: 118 mg/dL — ABNORMAL HIGH (ref 70–99)
Glucose-Capillary: 310 mg/dL — ABNORMAL HIGH (ref 70–99)
Glucose-Capillary: 251 mg/dL — ABNORMAL HIGH (ref 70–99)
Glucose-Capillary: 216 mg/dL — ABNORMAL HIGH (ref 70–99)

## 2020-02-04 MED ORDER — LIDOCAINE HCL URETHRAL/MUCOSAL 2 % EX GEL
1.0000 "application " | CUTANEOUS | Status: DC | PRN
  Administered 2020-02-08: 1 via URETHRAL
  Filled 2020-02-04 (×2): qty 5

## 2020-02-04 MED ORDER — CHLORHEXIDINE GLUCONATE CLOTH 2 % EX PADS: 6.0000 | MEDICATED_PAD | Freq: Two times a day (BID) | CUTANEOUS | Status: DC

## 2020-02-04 NOTE — Evaluation (Addendum)
Occupational Therapy Assessment and Plan  Patient Details  Name: Jason Montes MRN: [id number] Date of Birth: 10/09/1950  OT Diagnosis: abnormal posture, altered mental status, blindness and low vision, cognitive deficits, muscle weakness (generalized) and  L great toe amputation Rehab Potential:   ELOS: 21-24 days  Today's Date: 02/04/2020 OT Individual Time: 9381-8299 OT Individual Time Calculation (min): 57 min     Hospital Problem: Principal Problem:   Acute metabolic encephalopathy   Past Medical History:  Past Medical History:  Diagnosis Date  . Allergy   . Atrial fibrillation ([place])   . B12 deficiency   . Chronic kidney disease   . Chronic kidney disease (CKD), stage II (mild)   . Coronary atherosclerosis   . Diabetes mellitus without complication ([place])   . Elevated PSA   . Esophagitis   . GERD (gastroesophageal reflux disease)   . Heart attack ([place])   . Heart disease   . Hyperlipidemia   . Hypertension   . Hypokalemia   . Mild neurocognitive disorder   . Osteoarthritis   . Presbyopia   . PVD (peripheral vascular disease) ([place])   . Restless leg syndrome   . Stroke (cerebrum) ([place])   . Stroke ([place])   . Subacute osteomyelitis ([place])   . Tinea unguium   . Uncompensated short term memory deficit    Past Surgical History:  Past Surgical History:  Procedure Laterality Date  . AMPUTATION TOE Left 01/13/2020   Procedure: AMPUTATION RAY LEFT 1ST;  Surgeon: Caroline More, DPM;  Location: [facility] ORS;  Service: Podiatry;  Laterality: Left;  . CARDIAC PACEMAKER PLACEMENT    . COLONOSCOPY WITH PROPOFOL N/A 05/18/2018   Procedure: COLONOSCOPY WITH PROPOFOL;  Surgeon: [name], MD;  Location: [facility] ENDOSCOPY;  Service: Endoscopy;  Laterality: N/A;  . CORONARY ARTERY BYPASS GRAFT    . FOOT AMPUTATION Right   . INTRAMEDULLARY (IM) NAIL INTERTROCHANTERIC Right 11/16/2019   Procedure: INTRAMEDULLARY (IM) NAIL INTERTROCHANTRIC;  Surgeon: Thornton Park, MD;  Location:  [facility] ORS;  Service: Orthopedics;  Laterality: Right;  . LOWER EXTREMITY ANGIOGRAPHY Left 10/10/2019   Procedure: Lower Extremity Angiography;  Surgeon: Algernon Huxley, MD;  Location: [place] [facility];  Service: Cardiovascular;  Laterality: Left;  . LOWER EXTREMITY ANGIOGRAPHY Left 01/16/2020   Procedure: Lower Extremity Angiography;  Surgeon: Algernon Huxley, MD;  Location: [place] [facility];  Service: Cardiovascular;  Laterality: Left;    Assessment & Plan Clinical Impression: Jason Montes is a 69 year old right-handed male with history of hypertension, hyperlipidemia, atrial fibrillation maintained on Eliquis, CVA, diabetes mellitus, CKD stage III, CAD with cardiac arrest/pacemaker 2019 and resultant hypoxia with cognitive deficits  chronic left greater toe osteomyelitis with peripheral vascular disease, systolic congestive heart failure with ejection fraction of 40 to 45%.  Per chart review patient lives in a boardinghouse.  He has a daughter in the area.  Plan is return back to boarding home.  Presented 01/11/2020 with altered mental status.  There was some question of unintentional drug overdose.  Cranial CT scan negative for acute process.  Sequela of remote left occipital and left cerebellar insults.  Chest x-ray mild streaky bibasilar opacities favoring atelectasis.  EEG negative for seizure.  Urine drug screen positive tricyclic, sodium 371, sedimentation rate 83 glucose 115, BUN 34, creatinine 1.59, total bilirubin 1.4, hemoglobin 12, lactic acid 1.2, BNP 316.  Most recent echocardiogram ejection fraction of 40 to 45% no wall motion abnormalities.  Follow-up podiatry services for left hallux osteomyelitis and  underwent left hallux amputation 01/13/2020 per Dr. Rosetta Posner after cardiac clearance was obtained as well as later undergoing arteriography/aortogram of left lower extremity per vascular surgery Dr. Festus Barren showing relatively normal common femoral artery profunda femoris artery and  superficial femoral artery.  The posterior tibial artery was chronically occluded with no distal reconstitution.Marland Kitchen  Hospital course psychiatry service was consulted for possible drug overdose as well as capacity evaluation and patient was deemed independent to make medical decisions and has been maintained on Haldol 1 mg twice daily as well as Zyprexa via 12.5 mg nightly.  Patient with bouts of urinary retention and initial voiding trial failed with Foley catheter tube remaining in place therapy evaluations completed patient is full weightbearing through left heel only after recent amputation with postoperative shoe.  Patient was admitted for a comprehensive rehab program. Patient transferred to CIR on 02/03/2020 .    Patient currently requires max with basic self-care skills secondary to muscle weakness, decreased cardiorespiratoy endurance, impaired timing and sequencing, field cut, decreased attention to right, decreased initiation, decreased awareness, decreased problem solving, decreased safety awareness and decreased memory and decreased standing balance, decreased postural control and difficulty maintaining precautions.  Prior to hospitalization, patient could complete BADLs with independent .  Patient will benefit from skilled intervention to increase independence with basic self-care skills prior to discharge to boarding house with friend Amy (paid PCA) to assist with IADLs.  Anticipate patient will require intermittent supervision and follow up home health.  OT - End of Session Activity Tolerance: Tolerates 30+ min activity with multiple rests Endurance Deficit: Yes OT Assessment OT Barriers to Discharge: Decreased caregiver support;Weight bearing restrictions OT Patient demonstrates impairments in the following area(s): Balance;Cognition;Endurance;Motor;Safety;Vision OT Basic ADL's Functional Problem(s): Grooming;Bathing;Dressing;Toileting OT Transfers Functional Problem(s):  Toilet;Tub/Shower OT Additional Impairment(s): None OT Plan OT Intensity: Minimum of 1-2 x/day, 45 to 90 minutes OT Frequency: 5 out of 7 days OT Duration/Estimated Length of Stay: 14-16 days OT Treatment/Interventions: Balance/vestibular training;Discharge planning;Self Care/advanced ADL retraining;Therapeutic Activities;UE/LE Coordination activities;Visual/perceptual remediation/compensation;Therapeutic Exercise;Skin care/wound managment;Patient/family education;Functional mobility training;Disease mangement/prevention;Community reintegration;DME/adaptive equipment instruction;Neuromuscular re-education;Psychosocial support;UE/LE Strength taining/ROM;Wheelchair propulsion/positioning OT Self Feeding Anticipated Outcome(s): Indep OT Basic Self-Care Anticipated Outcome(s): Mod I - Supervision OT Toileting Anticipated Outcome(s): Mod I OT Bathroom Transfers Anticipated Outcome(s): Mod I - Supervision OT Recommendation Patient destination: Home (boarding house) Follow Up Recommendations: Home health OT Equipment Recommended: To be determined Equipment Details: pt has shower seat for shared walk in shower   OT Evaluation Precautions/Restrictions  Precautions Precautions: Fall;Other (comment) (LLE WBAT with shoe) Precaution Comments: LLE WBAT heel only with shoe Required Braces or Orthoses: Other Brace Other Brace: postop shoe Restrictions Weight Bearing Restrictions: Yes RLE Weight Bearing: Weight bearing as tolerated LLE Weight Bearing: Weight bearing as tolerated LLE Partial Weight Bearing Percentage or Pounds: WBAT through LLE heel only with post op shoe Pain Pain Assessment Pain Scale: 0-10 Pain Score: 0-No pain Home Living/Prior Functioning Home Living Available Help at Discharge: Personal care attendant (Amy, assist with IADLs) Type of Home: Other(Comment) (boarding home with other renters, has own room, shared bath) Home Access: Stairs to enter [employer] of  Steps: per pt, 5 STE front Entrance Stairs-Rails: Left Home Layout: Multi-level, Able to live on main level with bedroom/bathroom Bathroom Shower/Tub: Walk-in shower, Curtain Bathroom Toilet: Standard Bathroom Accessibility: Yes Additional Comments: PCA assists 2.5hrs/day with groceries, meal prep, laundry, etc...  Lives With: Alone, Other (Comment) (paid PCA to assist with IADLs) IADL History Homemaking Responsibilities: No Prior Function Level of  Independence: Independent with basic ADLs, Independent with gait, Independent with transfers, Needs assistance with homemaking Vocation: On disability Comments: Assist for IADLs Vision Baseline Vision/History:  (R visual field cut from previous CVA) Vision Assessment?: Yes Ocular Range of Motion: Restricted on the right Tracking/Visual Pursuits: Decreased smoothness of horizontal tracking;Decreased smoothness of eye movement to RIGHT superior field;Decreased smoothness of eye movement to RIGHT inferior field;Impaired - to be further tested in functional context Additional Comments: R field cut Perception  Perception: Impaired Praxis Praxis: Impaired Cognition Overall Cognitive Status: History of cognitive impairments - at baseline Arousal/Alertness: Awake/alert Orientation Level: Person;Place;Situation Person: Oriented Place: Oriented Situation: Oriented Year: 2021 Month: August Day of Week: Correct Memory: Appears intact Immediate Memory Recall: Sock;Blue;Bed Memory Recall Sock: Without Cue Memory Recall Blue: Without Cue Memory Recall Bed: Not able to recall Safety/Judgment: Appears intact (to be further assessed) Sensation Sensation Light Touch: Appears Intact (unable to test LLE d/t dressing, RLE transmetatarsal amputation) Coordination Gross Motor Movements are Fluid and Coordinated: Yes Fine Motor Movements are Fluid and Coordinated: Yes Motor  Motor Motor: Within Functional Limits Motor - Skilled Clinical  Observations: WFL but extended time to complete d/t fatigue/weakness  Trunk/Postural Assessment  Cervical Assessment Cervical Assessment: Exceptions to [facility] (FWD head) Thoracic Assessment Thoracic Assessment: Exceptions to [facility] (rounded shoulders) Lumbar Assessment Lumbar Assessment: Exceptions to [facility] (posterior pelvic tilt) Postural Control Postural Control: Within Functional Limits  Balance Balance Balance Assessed: Yes Dynamic Sitting Balance Sitting balance - Comments: lateral/posterior LOBs in standing and stand pivot transfers Extremity/Trunk Assessment RUE Assessment RUE Assessment: Exceptions to [facility] Active Range of Motion (AROM) Comments: decreased at shoulder, approx 75-90* General Strength Comments: 4/5 within his ROM LUE Assessment LUE Assessment: Within Functional Limits General Strength Comments: generalized weakness but functional  Care Tool Care Tool Self Care Eating    Set up    Oral Care     Set up    Bathing   Body parts bathed by patient: Right arm;Left arm;Chest;Abdomen;Front perineal area;Right upper leg;Left upper leg;Right lower leg;Left lower leg;Face Body parts bathed by helper: Buttocks;Right lower leg;Left lower leg   Assist Level: Moderate Assistance - Patient 50 - 74%    Upper Body Dressing(including orthotics)   What is the patient wearing?: Pull over shirt   Assist Level: Supervision/Verbal cueing    Lower Body Dressing (excluding footwear)   What is the patient wearing?: Pants Assist for lower body dressing: Maximal Assistance - Patient 25 - 49%    Putting on/Taking off footwear   What is the patient wearing?: Shoes Assist for footwear: Maximal Assistance - Patient 25 - 49%       Care Tool Toileting Toileting activity    Max A     Care Tool Bed Mobility Roll left and right activity   Roll left and right assist level: Minimal Assistance - Patient > 75%    Sit to lying activity    Min A    Lying to sitting edge of bed  activity    Min A     Care Tool Transfers Sit to stand transfer   Sit to stand assist level: Maximal Assistance - Patient 25 - 49%    Chair/bed transfer    Max A     Toilet transfer    Max A     Care Tool Cognition Expression of Ideas and Wants Expression of Ideas and Wants: Some difficulty - exhibits some difficulty with expressing needs and ideas (e.g, some words or finishing thoughts) or speech  is not clear   Understanding Verbal and Non-Verbal Content Understanding Verbal and Non-Verbal Content: Usually understands - understands most conversations, but misses some part/intent of message. Requires cues at times to understand   Memory/Recall Ability *first 3 days only Memory/Recall Ability *first 3 days only: Current season;That he or she is in a hospital/hospital unit    Refer to Care Plan for [place] 1 OT Short Term Goal 1 (Week 1): Pt will perform toilet transfers with Min A OT Short Term Goal 2 (Week 1): Pt will transition sit to stand with proper body mechanics with Min A in prep for LB ADLs OT Short Term Goal 3 (Week 1): Pt will perform LB dress with AE PRN with Min A OT Short Term Goal 4 (Week 1): Pt will don post surgical shoe and R regular shoe with Min A  Recommendations for other services: None     Skilled Interventions: Pt greeted at time of session supine resting in bed with no c/o pain, agreeable to OT eval. Discussed role and purpose of OT to increase indep with ADLs, pt agreeable. See above and below for details.   Supine to sit EOB Min A with extended time and use of bed features, donned shoes sitting EOB Max A with pt ed on WB status through heel only with post surgical shoe. Sit to stand max A with cues for hand/foot placement and proper body mechanics, therapist hand held assist Mod/Max for stand pivot to wheelchair. Set up at sink level for bathing (not cleared to shower), performed UB Supervision, LB with Mod A seated with  figure four technique and assist to wash buttocks in standing. UB dress Supervision, LB dress Max A with assist to thread Foley, thread feet, and to assist in donning over hips in standing. Pt inconsistent with sit to stands at sink surface, sometimes Mod, mostly Max A to come up to standing difficulty pushing up from wheelchair armrests. SPT back to bed attempted to use RW but posterior lean too limiting, stand pivot hand held assist max A to come up to standing, use of bed rail assist and therapist assist to slowly descend to bed surface. Sit to supine Min A, alarm on, call bell in reach. Discussed ELOS as well as expected daily therapy/CIR schedule.    Skilled Therapeutic Intervention ADL ADL Grooming: Supervision/safety Where Assessed-Grooming: Sitting at sink Upper Body Bathing: Supervision/safety Where Assessed-Upper Body Bathing: Sitting at sink Lower Body Bathing: Moderate assistance Where Assessed-Lower Body Bathing: Sitting at sink;Standing at sink Upper Body Dressing: Supervision/safety Where Assessed-Upper Body Dressing: Sitting at sink Lower Body Dressing: Maximal assistance Where Assessed-Lower Body Dressing: Sitting at sink;Standing at sink Mobility  Transfers Sit to Stand: Maximal Assistance - Patient 25-49% Stand to Sit: Maximal Assistance - Patient 25-49%   Discharge Criteria: Patient will be discharged from OT if patient refuses treatment 3 consecutive times without medical reason, if treatment goals not met, if there is a change in medical status, if patient makes no progress towards goals or if patient is discharged from hospital.  The above assessment, treatment plan, treatment alternatives and goals were discussed and mutually agreed upon: by patient  Viona Gilmore 02/04/2020, 9:38 AM

## 2020-02-04 NOTE — Evaluation (Signed)
Physical Therapy Assessment and Plan  Patient Details  Name: Jason Montes MRN: [id number] Date of Birth: Apr 28, 1951  PT Diagnosis: Abnormal posture, Abnormality of gait, Cognitive deficits, Difficulty walking, Impaired cognition, Impaired sensation, Muscle weakness and Pain in L LE  Rehab Potential: Good ELOS: ~3 weeks   Today's Date: 02/04/2020 PT Individual Time: 2694-8546 PT Individual Time Calculation (min): 35 min    Hospital Problem: Principal Problem:   Acute metabolic encephalopathy   Past Medical History:  Past Medical History:  Diagnosis Date   Allergy    Atrial fibrillation ([place])    B12 deficiency    Chronic kidney disease    Chronic kidney disease (CKD), stage II (mild)    Coronary atherosclerosis    Diabetes mellitus without complication (HCC)    Elevated PSA    Esophagitis    GERD (gastroesophageal reflux disease)    Heart attack ([place])    Heart disease    Hyperlipidemia    Hypertension    Hypokalemia    Mild neurocognitive disorder    Osteoarthritis    Presbyopia    PVD (peripheral vascular disease) ([place])    Restless leg syndrome    Stroke (cerebrum) ([place])    Stroke ([place])    Subacute osteomyelitis ([place])    Tinea unguium    Uncompensated short term memory deficit    Past Surgical History:  Past Surgical History:  Procedure Laterality Date   AMPUTATION TOE Left 01/13/2020   Procedure: AMPUTATION RAY LEFT 1ST;  Surgeon: Caroline More, DPM;  Location: [facility] ORS;  Service: Podiatry;  Laterality: Left;   CARDIAC PACEMAKER PLACEMENT     COLONOSCOPY WITH PROPOFOL N/A 05/18/2018   Procedure: COLONOSCOPY WITH PROPOFOL;  Surgeon: [name], MD;  Location: [facility] ENDOSCOPY;  Service: Endoscopy;  Laterality: N/A;   CORONARY ARTERY BYPASS GRAFT     FOOT AMPUTATION Right    INTRAMEDULLARY (IM) NAIL INTERTROCHANTERIC Right 11/16/2019   Procedure: INTRAMEDULLARY (IM) NAIL INTERTROCHANTRIC;  Surgeon: Thornton Park, MD;   Location: [facility] ORS;  Service: Orthopedics;  Laterality: Right;   LOWER EXTREMITY ANGIOGRAPHY Left 10/10/2019   Procedure: Lower Extremity Angiography;  Surgeon: Algernon Huxley, MD;  Location: [place] [facility];  Service: Cardiovascular;  Laterality: Left;   LOWER EXTREMITY ANGIOGRAPHY Left 01/16/2020   Procedure: Lower Extremity Angiography;  Surgeon: Algernon Huxley, MD;  Location: [place] [facility];  Service: Cardiovascular;  Laterality: Left;    Assessment & Plan Clinical Impression: Patient is a 69 y.o. year old right-handed male with history of hypertension, hyperlipidemia, atrial fibrillation maintained on Eliquis, CVA, diabetes mellitus, CKD stage III, CAD with cardiac arrest/pacemaker 2019 and resultant hypoxia with cognitive deficits  chronic left greater toe osteomyelitis with peripheral vascular disease, systolic congestive heart failure with ejection fraction of 40 to 45%.  Per chart review patient lives in a boardinghouse.  He has a daughter in the area.  Plan is return back to boarding home.  Presented 01/11/2020 with altered mental status.  There was some question of unintentional drug overdose.  Cranial CT scan negative for acute process.  Sequela of remote left occipital and left cerebellar insults.  Chest x-ray mild streaky bibasilar opacities favoring atelectasis.  EEG negative for seizure.  Urine drug screen positive tricyclic, sodium 270, sedimentation rate 83 glucose 115, BUN 34, creatinine 1.59, total bilirubin 1.4, hemoglobin 12, lactic acid 1.2, BNP 316.  Most recent echocardiogram ejection fraction of 40 to 45% no wall motion abnormalities.  Follow-up podiatry services for left hallux osteomyelitis  and underwent left hallux amputation 01/13/2020 per Dr. Caroline More after cardiac clearance was obtained as well as later undergoing arteriography/aortogram of left lower extremity per vascular surgery Dr. Leotis Pain showing relatively normal common femoral artery profunda femoris artery  and superficial femoral artery.  The posterior tibial artery was chronically occluded with no distal reconstitution.Marland Kitchen  Hospital course psychiatry service was consulted for possible drug overdose as well as capacity evaluation and patient was deemed independent to make medical decisions and has been maintained on Haldol 1 mg twice daily as well as Zyprexa via 12.5 mg nightly.  Patient with bouts of urinary retention and initial voiding trial failed with Foley catheter tube remaining in place therapy evaluations completed patient is full weightbearing through left heel only after recent amputation with postoperative shoe.  Patient was admitted for a comprehensive rehab program. Patient transferred to CIR on 02/03/2020 .   Patient currently requires max assist with mobility secondary to muscle weakness and muscle joint tightness, decreased cardiorespiratoy endurance, impaired timing and sequencing, abnormal tone, unbalanced muscle activation, motor apraxia, decreased coordination and decreased motor planning, field cut, decreased motor planning, decreased attention, decreased awareness, decreased problem solving and decreased memory and decreased sitting balance, decreased standing balance, decreased postural control, decreased balance strategies and difficulty maintaining precautions.  Prior to hospitalization, patient was modified independent  with mobility and lived with Alone, Other (Comment) (lives alone with paid PCA for IADLs) in a Other(Comment) (boarding home with other renters, has own room, shared bath).  Home access is per pt, 4-5 STE front with Left handrail.  Patient will benefit from skilled PT intervention to maximize safe functional mobility, minimize fall risk and decrease caregiver burden for planned discharge home with 24 hour supervision.  Anticipate patient will benefit from follow up [place] at discharge.  PT - End of Session Activity Tolerance: Tolerates 30+ min activity with multiple  rests Endurance Deficit: Yes Endurance Deficit Description: requires rest breaks PT Assessment Rehab Potential (ACUTE/IP ONLY): Good PT Barriers to Discharge: [place] home environment;Decreased caregiver support;Home environment access/layout;Lack of/limited family support;Weight bearing restrictions PT Patient demonstrates impairments in the following area(s): Balance;Pain;Behavior;Perception;Edema;Safety;Endurance;Sensory;Motor;Nutrition;Skin Integrity PT Transfers Functional Problem(s): Bed Mobility;Bed to Chair;Car;Furniture PT Locomotion Functional Problem(s): Ambulation;Wheelchair Mobility;Stairs PT Plan PT Intensity: Minimum of 1-2 x/day ,45 to 90 minutes PT Frequency: 5 out of 7 days PT Duration Estimated Length of Stay: ~3 weeks PT Treatment/Interventions: Ambulation/gait training;Cognitive remediation/compensation;Discharge planning;DME/adaptive equipment instruction;Functional mobility training;Pain management;Psychosocial support;Splinting/orthotics;Therapeutic Activities;UE/LE Strength taining/ROM;Visual/perceptual remediation/compensation;Balance/vestibular training;Community reintegration;Disease management/prevention;Functional electrical stimulation;Neuromuscular re-education;Patient/family education;Skin care/wound management;Stair training;Therapeutic Exercise;UE/LE Coordination activities;Wheelchair propulsion/positioning PT Transfers Anticipated Outcome(s): supervision PT Locomotion Anticipated Outcome(s): supervision PT Recommendation Follow Up Recommendations: Home health PT;24 hour supervision/assistance Patient destination: Home Equipment Recommended: To be determined   PT Evaluation Precautions/Restrictions Precautions Precautions: Fall;Other (comment) Precaution Comments: LLE WBAT heel only with post-op shoe Required Braces or Orthoses: Other Brace Other Brace: postop shoe Restrictions Weight Bearing Restrictions: Yes RLE Weight Bearing: Weight bearing  as tolerated LLE Weight Bearing: Weight bearing as tolerated LLE Partial Weight Bearing Percentage or Pounds: WBAT through LLE heel only with post op shoe Pain Pain Assessment Pain Scale: 0-10 Pain Score: 6  Pain Type: Acute pain Pain Location: Heel Pain Orientation: Left (heel) Pain Descriptors / Indicators: Other (Comment) ("steady" pain) Pain Onset: On-going Pain Intervention(s): Medication (See eMAR);Rest;Relaxation;Distraction;Emotional support Home Living/Prior Functioning Home Living Available Help at Discharge: Personal care attendant (Amy, assist with IADLs) Type of Home: Other(Comment) (boarding home with other renters, has own room, shared bath) Home Access:  Stairs to enter [employer] of Steps: per pt, 4-5 STE front Entrance Stairs-Rails: Left Home Layout: Multi-level;Able to live on main level with bedroom/bathroom Alternate Level Stairs-Number of Steps: 6 to 8 stair entry per family Bathroom Shower/Tub: Walk-in shower;Curtain Bathroom Toilet: Standard Bathroom Accessibility: Yes Additional Comments: PCA assists 2.5hrs/day with groceries, meal prep, laundry, etc...  Lives With: Alone;Other (Comment) (lives alone with paid PCA for IADLs) Prior Function Level of Independence: Independent with gait;Independent with transfers;Needs assistance with homemaking  Able to Take Stairs?: Yes (used railing and SPC) Vocation: On disability Comments: reports he used cane or RW but primarily used [facility] for all ambulation Vision/Perception  Vision - Assessment Additional Comments: hx of R field cut due to prior CVAs Perception Perception: Impaired Praxis Praxis: Impaired Praxis Impairment Details: Motor planning  Cognition Overall Cognitive Status: History of cognitive impairments - at baseline Arousal/Alertness: Awake/alert Orientation Level: Disoriented to time;Oriented to person;Disoriented to place;Oriented to situation (states it is "9/3" but then states it is  "2014", states he is in "[place]" but has to use the written name of hospital on board to recall exact location, states he is "here for classes" then with question quing able to recall L toe amputation) Attention: Focused Focused Attention: Appears intact Memory: Impaired Immediate Memory Recall: Sock;Blue;Bed Memory Recall Sock: Without Cue Memory Recall Blue: Without Cue Memory Recall Bed: Not able to recall Safety/Judgment: Impaired Sensation Sensation Light Touch: Impaired Detail Peripheral sensation comments: decreased on R lateral lower leg and distally; decreased on L lower leg medially and distally Light Touch Impaired Details: Impaired RLE;Impaired LLE Hot/Cold: Not tested Proprioception: Impaired by gross assessment Stereognosis: Not tested Coordination Gross Motor Movements are Fluid and Coordinated: No Fine Motor Movements are Fluid and Coordinated: Yes Coordination and Movement Description: impaired due to L LE WBing restrictions, generalized weakness, and impaired balance Motor  Motor Motor: Other (comment);Abnormal postural alignment and control;Abnormal tone Motor - Skilled Clinical Observations: decreased trunk flexibility, generalized weakness   Trunk/Postural Assessment  Cervical Assessment Cervical Assessment: Exceptions to [facility] (forward head) Thoracic Assessment Thoracic Assessment: Exceptions to [facility] (rounded shoulders, thoracic kyphosis, decreased flexiblity in trunk with mobility) Lumbar Assessment Lumbar Assessment: Exceptions to [facility] (posterior pelvic tilt in sitting) Postural Control Postural Control: Deficits on evaluation Trunk Control: decreased Postural Limitations: decreased  Balance Balance Balance Assessed: Yes Static Sitting Balance Static Sitting - Balance Support: Feet unsupported Static Sitting - Level of Assistance: 5: Stand by assistance (CGA) Dynamic Sitting Balance Dynamic Sitting - Balance Support: During functional activity;Feet  unsupported Dynamic Sitting - Level of Assistance: 4: Min assist Static Standing Balance Static Standing - Balance Support: During functional activity;Bilateral upper extremity supported Static Standing - Level of Assistance: 3: Mod assist Extremity Assessment      RLE Assessment RLE Assessment: Exceptions to [facility] Active Range of Motion (AROM) Comments: WFL - some slight limited ankle ROM but WFL RLE Strength Right Hip Flexion: 4/5 Right Knee Flexion: 4/5 Right Knee Extension: 4/5 LLE Assessment LLE Assessment: Exceptions to [facility] Active Range of Motion (AROM) Comments: WFL LLE Strength Left Hip Flexion: 4/5 Left Knee Flexion: 4/5 Left Knee Extension: 4/5 Left Ankle Dorsiflexion: 3/5 (no resistance applied) Left Ankle Plantar Flexion: 3/5 (no resistance applied)  Care Tool Care Tool Bed Mobility Roll left and right activity   Roll left and right assist level: Minimal Assistance - Patient > 75%    Sit to lying activity   Sit to lying assist level: Moderate Assistance - Patient 50 - 74%  Lying to sitting edge of bed activity   Lying to sitting edge of bed assist level: Moderate Assistance - Patient 50 - 74%     Care Tool Transfers Sit to stand transfer   Sit to stand assist level: Maximal Assistance - Patient 25 - 49%    Chair/bed transfer   Chair/bed transfer assist level: Maximal Assistance - Patient 25 - 49% (squat pivot)     Psychologist, counselling transfer activity did not occur: Safety/medical concerns        Care Tool Locomotion Ambulation Ambulation activity did not occur: Safety/medical concerns        Walk 10 feet activity Walk 10 feet activity did not occur: Safety/medical concerns       Walk 50 feet with 2 turns activity Walk 50 feet with 2 turns activity did not occur: Safety/medical concerns      Walk 150 feet activity Walk 150 feet activity did not occur: Safety/medical concerns      Walk 10 feet on uneven surfaces activity  Walk 10 feet on uneven surfaces activity did not occur: Safety/medical concerns      Stairs Stair activity did not occur: Safety/medical concerns        Walk up/down 1 step activity Walk up/down 1 step or curb (drop down) activity did not occur: Safety/medical concerns     Walk up/down 4 steps activity did not occuR: Safety/medical concerns  Walk up/down 4 steps activity      Walk up/down 12 steps activity Walk up/down 12 steps activity did not occur: Safety/medical concerns      Pick up small objects from floor Pick up small object from the floor (from standing position) activity did not occur: Safety/medical concerns      Wheelchair Will patient use wheelchair at discharge?:  (TBD) Type of Wheelchair: Manual   Wheelchair assist level: Moderate Assistance - Patient 50 - 74% Max wheelchair distance: 173ft  Wheel 50 feet with 2 turns activity   Assist Level: Minimal Assistance - Patient > 75%;Moderate Assistance - Patient 50 - 74%  Wheel 150 feet activity   Assist Level: Maximal Assistance - Patient 25 - 49%    Refer to Care Plan for Long Term Goals  SHORT TERM GOAL WEEK 1 PT Short Term Goal 1 (Week 1): Pt will perform supine<>sit with min assist PT Short Term Goal 2 (Week 1): Pt will perform sit<>stands using LRAD with min assist PT Short Term Goal 3 (Week 1): Pt will perform bed<>chair transfers with mod assist PT Short Term Goal 4 (Week 1): Pt will ambulate at least 68ft using LRAD with mod assist  Recommendations for other services: None   Skilled Therapeutic Intervention Mobility Bed Mobility Bed Mobility: Supine to Sit;Sit to Supine Supine to Sit: Moderate Assistance - Patient 50-74% Sit to Supine: Moderate Assistance - Patient 50-74% Transfers Transfers: Sit to Stand;Stand to Sit;Squat Pivot Transfers Sit to Stand: Maximal Assistance - Patient 25-49% Stand to Sit: Maximal Assistance - Patient 25-49% Squat Pivot Transfer Details: Verbal cues for technique;Verbal  cues for precautions/safety;Manual facilitation for weight shifting;Manual facilitation for placement;Tactile cues for weight shifting;Tactile cues for sequencing;Tactile cues for posture;Tactile cues for initiation;Tactile cues for placement;Visual cues/gestures for sequencing;Verbal cues for sequencing Squat Pivot Transfers: Maximal Assistance - Patient 25-49% Transfer (Assistive device): Rolling walker Locomotion  Gait Ambulation: No (unable to progress to ambulation due to orthostatic hypotension) Stairs / Additional Locomotion Stairs: No Wheelchair Mobility Wheelchair Mobility: Yes Wheelchair  Assistance: Moderate Assistance - Patient 50 - 74% Wheelchair Propulsion: Both upper extremities Wheelchair Parts Management: Needs assistance Distance: 169f   Evaluation completed (see details above) with education on PT POC and goals and individual treatment initiated with focus on bed mobility, transfers, activity tolerance, and wheelchair propulsion. Pt performed the above mobility tasks with the assistance levels described. Pt able to recall L LE WBing precautions with question cuing. Pt demos decreased trunk flexibility with rigid posturing, posterior lean, and lack of adequate anterior trunk lean to perform mobility tasks requiring increased assistance for transfers.  Orthostatic vitals assessed: Supine: BP 128/66 (MAP 84), HR 81bpm  Sitting: BP 119/63 (MAP 80), HR 83bpm Standing: BP 71/35 (MAP 47), HR 90bpm with pt reporting significant lightheadedness Sitting: BP 127/69 (MAP 86), HR 88bpm [place] RN notified of pt's orthostatic hypotension and therapist requested an abdominal binder be ordered due to concern of wearing TED hose on RLE with pt's impaired vasculature and decreased sensation. Therapist educated pt on importance of increasing upright, OOB sitting activity to improve vital sign response with standing.  Later standing in stedy: BP 116/56 (MAP 72), HR 88bpm with pt reporting  lightheadedness but not as severe as earlier - therapist notified nursing staff to perform +2 stedy transfers with pt for increased safety. Therapist provided patient with different wheelchair and roho cushion to improve upright, OOB activity tolerance and pressure relief. Pt educated regarding daily therapy schedule, weekly team meetings, purpose of PT evaluation, and other CIR information including fall risk safety. At end of session pt left seated in w/c with needs in reach and seat belt alarm on.   Discharge Criteria: Patient will be discharged from PT if patient refuses treatment 3 consecutive times without medical reason, if treatment goals not met, if there is a change in medical status, if patient makes no progress towards goals or if patient is discharged from hospital.  The above assessment, treatment plan, treatment alternatives and goals were discussed and mutually agreed upon: by patient  CTawana Scale, PT, DPT, CSRS  02/04/2020, 7:59 AM

## 2020-02-04 NOTE — Progress Notes (Signed)
[place] PHYSICAL MEDICINE & REHABILITATION PROGRESS NOTE   Subjective/Complaints:    Pt very emphatic he was in [place] class til yesterday- but actually was in hospital.   Pt reports they placed the "foley because I passed out". However per nurse already failed voiding trial and was put back in- will try to remove and in/out cath if needed.    ROS:  Pt denies SOB, abd pain, CP, N/V/C/D, and vision changes  Objective:   No results found. Recent Labs    02/02/20 0447 02/03/20 0505  WBC 9.9 6.0  HGB 8.9* 10.5*  HCT 26.5* 31.3*  PLT 279 130*   Recent Labs    02/02/20 0447 02/03/20 0505  NA 131* 132*  K 5.6* 4.9  CL 95* 98  CO2 30 26  GLUCOSE 197* 133*  BUN 31* 32*  CREATININE 1.38* 1.43*  CALCIUM 9.4 9.5    Intake/Output Summary (Last 24 hours) at 02/04/2020 1234 Last data filed at 02/04/2020 0900 Gross per 24 hour  Intake 578 ml  Output 1050 ml  Net -472 ml     Physical Exam: Vital Signs Blood pressure 121/69, pulse 83, temperature 98.1 F (36.7 C), resp. rate 16, weight 73.2 kg, SpO2 96 %.  Physical Exam General: pt sitting up in bed- nurse trying to orient him and get him to eat breakfast- emphatic wants foley out "NOW"- Thought was in [place] class yesterday, but knows he's in hospital now, NAD HEENT: conjugate gaze Heart: RRR Chest: CTA B/L- no W/R/R- good air movement Abdomen: Soft, NT, ND, (+)BS  Extremities: No clubbing, cyanosis, or edema. Pulses are 2+ Skin: Left foot dressing in place. R foot with healed amputation Neuro: pt knew in hospital, but said was in [place] class yesterday. Cranial nerves 2-12 are intact.  Psych: perseverative  Assessment/Plan: 1. Functional deficits secondary to acute metabolic encephalopathy/drug overdose and hx of multiple CVAs which require 3+ hours per day of interdisciplinary therapy in a comprehensive inpatient rehab setting.  Physiatrist is providing close team supervision and 24 hour management of active  medical problems listed below.  Physiatrist and rehab team continue to assess barriers to discharge/monitor patient progress toward functional and medical goals  Care Tool:  Bathing    Body parts bathed by patient: Right arm, Left arm, Chest, Abdomen, Front perineal area, Right upper leg, Left upper leg, Right lower leg, Left lower leg, Face   Body parts bathed by helper: Buttocks, Right lower leg, Left lower leg     Bathing assist Assist Level: Moderate Assistance - Patient 50 - 74%     Upper Body Dressing/Undressing Upper body dressing   What is the patient wearing?: Pull over shirt    Upper body assist Assist Level: Supervision/Verbal cueing    Lower Body Dressing/Undressing Lower body dressing      What is the patient wearing?: Pants     Lower body assist Assist for lower body dressing: Maximal Assistance - Patient 25 - 49%     Toileting Toileting    Toileting assist       Transfers Chair/bed transfer  Transfers assist           Locomotion Ambulation   Ambulation assist              Walk 10 feet activity   Assist           Walk 50 feet activity   Assist           Walk 150 feet activity  Assist           Walk 10 feet on uneven surface  activity   Assist           Wheelchair     Assist               Wheelchair 50 feet with 2 turns activity    Assist            Wheelchair 150 feet activity     Assist          Blood pressure 121/69, pulse 83, temperature 98.1 F (36.7 C), resp. rate 16, weight 73.2 kg, SpO2 96 %.  Medical Problem List and Plan: 1.  Altered mental status secondary to acute metabolic encephalopathy related to possible drug overdose as well as history of multiple CVAs/vascular dementia             -patient may sponge bathe due to LLE surgery             -ELOS/Goals: modI 7-8 days             -Admit to CIR 2.  Antithrombotics: -DVT/anticoagulation: Eliquis              -antiplatelet therapy: Aspirin 81 mg daily, Plavix 75 mg daily 3. Pain Management: Neurontin 200 mg 3 times daily, Cymbalta 20 mg daily 4. Mood: Provide emotional support             -antipsychotic agents: Haldol 1 mg twice daily, Zyprexa 12.5 mg nightly 5. Neuropsych: This patient is? capable of making decisions on his own behalf. 6. Skin/Wound Care: Routine skin checks 7. Fluids/Electrolytes/Nutrition: Routine in and outs with follow-up chemistries 8.  Atrial fibrillation.  Amiodarone 400 mg daily.  Cardiac rate controlled 9.  Chronic left hallux osteomyelitis.  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 Caroline More.  Weightbearing as tolerated through left heel only with postoperative shoe  9/4- needs cues to remember WB precautions 10.  Hypertension.  Monitor with increased mobility. Well controlled.  11.  Diabetes mellitus with peripheral neuropathy.  Hemoglobin A1c 7.9.  NovoLog 6 units 3 times daily with meals, Lantus insulin 10 units daily.  Diabetic teaching. Uncontrolled. Increase Lantus to 11 U   CBG (last 3)  Recent Labs    02/03/20 2112 02/04/20 0624 02/04/20 1224  GLUCAP 263* 118* 310*    9/4- BGs 118-310- extremely variable- they increased Lantus yesterday- will give 24 hours to see how it works.  12.  CKD stage III as well as bouts of hyperkalemia.  Admission creatinine 1.59.  Continue Lokelma 5 mg twice daily, follow-up chemistries  9/4- Labs Monday 13.  Hyperlipidemia.  Lipitor 14.  CAD with history of pacemaker.  Continue aspirin 15.  Chronic systolic congestive heart failure.  Latest ejection fraction 40 to 45%.  Monitor for any signs of fluid overload   Filed Weights   02/04/20 0348  Weight: 73.2 kg    09/4- will monitor weight for trend 16.  Urinary retention.  Trial of void   9/4- will remove foley, do bladder scans/PVRs and in/out cath if PVRs >300cc until voiding- is on Flomax.     LOS: 1 days A FACE TO FACE EVALUATION WAS  PERFORMED  Americo Vallery 02/04/2020, 12:34 PM

## 2020-02-04 NOTE — Plan of Care (Signed)
°  Problem: RH Balance Goal: LTG: Patient will maintain dynamic sitting balance (OT) Description: LTG:  Patient will maintain dynamic sitting balance with assistance during activities of daily living (OT) Flowsheets (Taken 02/04/2020 1451) LTG: Pt will maintain dynamic sitting balance during ADLs with: Supervision/Verbal cueing Goal: LTG Patient will maintain dynamic standing with ADLs (OT) Description: LTG:  Patient will maintain dynamic standing balance with assist during activities of daily living (OT)  Flowsheets (Taken 02/04/2020 1451) LTG: Pt will maintain dynamic standing balance during ADLs with: Supervision/Verbal cueing   Problem: Sit to Stand Goal: LTG:  Patient will perform sit to stand in prep for activites of daily living with assistance level (OT) Description: LTG:  Patient will perform sit to stand in prep for activites of daily living with assistance level (OT) Flowsheets (Taken 02/04/2020 1451) LTG: PT will perform sit to stand in prep for activites of daily living with assistance level: Supervision/Verbal cueing   Problem: RH Grooming Goal: LTG Patient will perform grooming w/assist,cues/equip (OT) Description: LTG: Patient will perform grooming with assist, with/without cues using equipment (OT) Flowsheets (Taken 02/04/2020 1451) LTG: Pt will perform grooming with assistance level of: Supervision/Verbal cueing   Problem: RH Bathing Goal: LTG Patient will bathe all body parts with assist levels (OT) Description: LTG: Patient will bathe all body parts with assist levels (OT) Flowsheets (Taken 02/04/2020 1451) LTG: Pt will perform bathing with assistance level/cueing: Supervision/Verbal cueing   Problem: RH Dressing Goal: LTG Patient will perform upper body dressing (OT) Description: LTG Patient will perform upper body dressing with assist, with/without cues (OT). Flowsheets (Taken 02/04/2020 1451) LTG: Pt will perform upper body dressing with assistance level of: Set up  assist Goal: LTG Patient will perform lower body dressing w/assist (OT) Description: LTG: Patient will perform lower body dressing with assist, with/without cues in positioning using equipment (OT) Flowsheets (Taken 02/04/2020 1451) LTG: Pt will perform lower body dressing with assistance level of: Supervision/Verbal cueing   Problem: RH Toileting Goal: LTG Patient will perform toileting task (3/3 steps) with assistance level (OT) Description: LTG: Patient will perform toileting task (3/3 steps) with assistance level (OT)  Flowsheets (Taken 02/04/2020 1451) LTG: Pt will perform toileting task (3/3 steps) with assistance level: Supervision/Verbal cueing   Problem: RH Toilet Transfers Goal: LTG Patient will perform toilet transfers w/assist (OT) Description: LTG: Patient will perform toilet transfers with assist, with/without cues using equipment (OT) Flowsheets (Taken 02/04/2020 1451) LTG: Pt will perform toilet transfers with assistance level of: Supervision/Verbal cueing   Problem: RH Tub/Shower Transfers Goal: LTG Patient will perform tub/shower transfers w/assist (OT) Description: LTG: Patient will perform tub/shower transfers with assist, with/without cues using equipment (OT) Flowsheets (Taken 02/04/2020 1451) LTG: Pt will perform tub/shower stall transfers with assistance level of: Supervision/Verbal cueing   Problem: RH Awareness Goal: LTG: Patient will demonstrate awareness during functional activites type of (OT) Description: LTG: Patient will demonstrate awareness during functional activites type of (OT) Flowsheets (Taken 02/04/2020 1451) Patient will demonstrate awareness during functional activites type of: Emergent LTG: Patient will demonstrate awareness during functional activites type of (OT): Supervision

## 2020-02-04 NOTE — Evaluation (Signed)
Speech Language Pathology Assessment and Plan  Patient Details  Name: Jason Montes MRN: [id number] Date of Birth: Mar 19, 1951  SLP Diagnosis: Cognitive Impairments  Rehab Potential: Fair ELOS: 21-24 days    Today's Date: 02/04/2020 SLP Individual Time: 1335-1430 SLP Individual Time Calculation (min): 55 min   Hospital Problem: Principal Problem:   Acute metabolic encephalopathy  Past Medical History:  Past Medical History:  Diagnosis Date  . Allergy   . Atrial fibrillation ([place])   . B12 deficiency   . Chronic kidney disease   . Chronic kidney disease (CKD), stage II (mild)   . Coronary atherosclerosis   . Diabetes mellitus without complication ([place])   . Elevated PSA   . Esophagitis   . GERD (gastroesophageal reflux disease)   . Heart attack ([place])   . Heart disease   . Hyperlipidemia   . Hypertension   . Hypokalemia   . Mild neurocognitive disorder   . Osteoarthritis   . Presbyopia   . PVD (peripheral vascular disease) ([place])   . Restless leg syndrome   . Stroke (cerebrum) ([place])   . Stroke ([place])   . Subacute osteomyelitis ([place])   . Tinea unguium   . Uncompensated short term memory deficit    Past Surgical History:  Past Surgical History:  Procedure Laterality Date  . AMPUTATION TOE Left 01/13/2020   Procedure: AMPUTATION RAY LEFT 1ST;  Surgeon: Caroline More, DPM;  Location: [facility] ORS;  Service: Podiatry;  Laterality: Left;  . CARDIAC PACEMAKER PLACEMENT    . COLONOSCOPY WITH PROPOFOL N/A 05/18/2018   Procedure: COLONOSCOPY WITH PROPOFOL;  Surgeon: [name], MD;  Location[facility] ENDOSCOPY;  Service: Endoscopy;  Laterality: N/A;  . CORONARY ARTERY BYPASS GRAFT    . FOOT AMPUTATION Right   . INTRAMEDULLARY (IM) NAIL INTERTROCHANTERIC Right 11/16/2019   Procedure: INTRAMEDULLARY (IM) NAIL INTERTROCHANTRIC;  Surgeon: Thornton Park, MD;  Location: [facility] ORS;  Service: Orthopedics;  Laterality: Right;  . LOWER EXTREMITY ANGIOGRAPHY Left 10/10/2019   Procedure:  Lower Extremity Angiography;  Surgeon: Algernon Huxley, MD;  Location: [place] CV LAB;  Service: Cardiovascular;  Laterality: Left;  . LOWER EXTREMITY ANGIOGRAPHY Left 01/16/2020   Procedure: Lower Extremity Angiography;  Surgeon: Algernon Huxley, MD;  Location: [place] CV LAB;  Service: Cardiovascular;  Laterality: Left;    Assessment / Plan / Recommendation Clinical Impression Patient is a 69 year old right-handed male with history of hypertension, hyperlipidemia, atrial fibrillation maintained on Eliquis, CVA, diabetes mellitus, CKD stage III, CAD with cardiac arrest/pacemaker 2019 and resultant hypoxia with cognitive deficits, chronic left greater toe osteomyelitis with peripheral vascular disease, systolic congestive heart failure with ejection fraction of 40 to 45%.  Per chart review patient lives in a boardinghouse.  He has a daughter in the area.  Plan is return back to boarding home.  Presented 01/11/2020 with altered mental status.  There was some question of unintentional drug overdose.  Cranial CT scan negative for acute process.  Sequela of remote left occipital and left cerebellar insults.  Chest x-ray mild streaky bibasilar opacities favoring atelectasis.  EEG negative for seizure.  Urine drug screen positive tricyclic, sodium 622, sedimentation rate 83 glucose 115, BUN 34, creatinine 1.59, total bilirubin 1.4, hemoglobin 12, lactic acid 1.2, BNP 316.  Most recent echocardiogram ejection fraction of 40 to 45% no wall motion abnormalities.  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 Caroline More after cardiac clearance was obtained as well as later undergoing  arteriography/aortogram of left lower extremity per vascular surgery Dr. Festus Barren showing relatively normal common femoral artery profunda femoris artery and superficial femoral artery.  The posterior tibial artery was chronically occluded with no distal  reconstitution.Marland Kitchen  Hospital course psychiatry service was consulted for possible drug overdose as well as capacity evaluation and patient was deemed independent to make medical decisions and has been maintained on Haldol 1 mg twice daily as well as Zyprexa via 12.5 mg nightly.  Patient with bouts of urinary retention and initial voiding trial failed with Foley catheter tube remaining in place therapy evaluations completed patient is full weightbearing through left heel only after recent amputation with postoperative shoe. Patient was admitted for a comprehensive rehab program 02/03/20.  Patient administered the [facility] Mental Examination (SLUMS) and scored 21/30 points indicating a mild cognitive impairment. Patient demonstrates deficits in problem solving and recall. Patient does have a history of cognitive deficits at baseline but reports he was not utilizing any compensatory strategies to maximize recall and carryover of information. Suspect patient could benefit from skilled SLP intervention to maximize initiation and utilization of memory compensatory strategies, problem solving and overall safety awareness.     Skilled Therapeutic Interventions          Administered a cognitive-linguistic evaluation, please see above for details.   SLP Assessment  Patient will need skilled Speech Lanaguage Pathology Services during CIR admission    Recommendations  Recommendations for Other Services: Neuropsych consult Patient destination: Home Follow up Recommendations: None Equipment Recommended: None recommended by SLP    SLP Frequency 3 to 5 out of 7 days   SLP Duration  SLP Intensity  SLP Treatment/Interventions 21-24 days  Minumum of 1-2 x/day, 30 to 90 minutes  Cognitive remediation/compensation;Internal/external aids;Therapeutic Activities;Cueing hierarchy;Environmental controls;Functional tasks;Patient/family education    Pain Pain Assessment Pain Scale: 0-10 Pain Score: 6   Pain Type: Acute pain Pain Location: Heel Pain Orientation: Left Pain Descriptors / Indicators: Aching Pain Onset: On-going Pain Intervention(s): Medication (See eMAR)  Prior Functioning Type of Home: Other(Comment) (boarding home with other renters, has own room, shared bath)  Lives With: Alone;Other (Comment) (lives alone with paid PCA for IADLs) Available Help at Discharge: Personal care attendant (Amy, assist with IADLs) Vocation: On disability  SLP Evaluation Cognition Overall Cognitive Status: History of cognitive impairments - at baseline Arousal/Alertness: Awake/alert Orientation Level: Oriented to person;Oriented to place;Oriented to situation;Disoriented to time Attention: Focused Focused Attention: Appears intact Selective Attention: Appears intact Memory: Impaired Memory Impairment: Storage deficit;Decreased recall of new information;Decreased short term memory Awareness: Impaired Awareness Impairment: Emergent impairment Problem Solving: Impaired Problem Solving Impairment: Functional basic Safety/Judgment: Impaired  Comprehension Auditory Comprehension Overall Auditory Comprehension: Appears within functional limits for tasks assessed Expression Expression Primary Mode of Expression: Verbal Verbal Expression Overall Verbal Expression: Appears within functional limits for tasks assessed Written Expression Written Expression: Not tested Oral Motor Oral Motor/Sensory Function Overall Oral Motor/Sensory Function: Within functional limits Motor Speech Overall Motor Speech: Appears within functional limits for tasks assessed  Care Tool Care Tool Cognition Expression of Ideas and Wants Expression of Ideas and Wants: Some difficulty - exhibits some difficulty with expressing needs and ideas (e.g, some words or finishing thoughts) or speech is not clear   Understanding Verbal and Non-Verbal Content Understanding Verbal and Non-Verbal Content: Usually understands  - understands most conversations, but misses some part/intent of message. Requires cues at times to understand   Memory/Recall Ability *first 3 days only Memory/Recall Ability *first 3 days only:  Current season;That he or she is in a hospital/hospital unit      Short Term Goals: Week 1: SLP Short Term Goal 1 (Week 1): Patient will recall basic, daily information with mod A verbal cues for use of memory compensatory strategies. SLP Short Term Goal 2 (Week 1): Patient will demonstrate functional problem solving for basic and familiar tasks with supervison verbal cues. SLP Short Term Goal 3 (Week 1): Patient will self-monitor and correct errors during functional tasks with supervision verbal cues.  Refer to Care Plan for Long Term Goals  Recommendations for other services: Neuropsych  Discharge Criteria: Patient will be discharged from SLP if patient refuses treatment 3 consecutive times without medical reason, if treatment goals not met, if there is a change in medical status, if patient makes no progress towards goals or if patient is discharged from hospital.  The above assessment, treatment plan, treatment alternatives and goals were discussed and mutually agreed upon: by patient  Gaege Sangalang 02/04/2020, 2:44 PM

## 2020-02-05 LAB — GLUCOSE, CAPILLARY
Glucose-Capillary: 144 mg/dL — ABNORMAL HIGH (ref 70–99)
Glucose-Capillary: 209 mg/dL — ABNORMAL HIGH (ref 70–99)
Glucose-Capillary: 376 mg/dL — ABNORMAL HIGH (ref 70–99)
Glucose-Capillary: 308 mg/dL — ABNORMAL HIGH (ref 70–99)

## 2020-02-05 MED ORDER — TAMSULOSIN HCL 0.4 MG PO CAPS
0.8000 mg | ORAL_CAPSULE | Freq: Every day | ORAL | Status: DC
  Administered 2020-02-05 – 2020-02-28 (×24): 0.8 mg via ORAL
  Filled 2020-02-05 (×24): qty 2

## 2020-02-05 MED ORDER — SORBITOL 70 % SOLN
30.0000 mL | Freq: Every day | Status: DC | PRN
  Administered 2020-02-09: 30 mL via ORAL
  Filled 2020-02-05 (×3): qty 30

## 2020-02-05 MED ORDER — INSULIN GLARGINE 100 UNIT/ML ~~LOC~~ SOLN
13.0000 [IU] | Freq: Every day | SUBCUTANEOUS | Status: DC
  Administered 2020-02-06: 13 [IU] via SUBCUTANEOUS
  Filled 2020-02-05: qty 0.13

## 2020-02-05 NOTE — Progress Notes (Signed)
Am meal - patient too confused and sleepy to eat, no meal coverage given. Lunch - patient still groggy through lunch hour, woke up and ate full meal after meal coverage not given. Dinner - patient did not eat entire meal because didn't like it, but fully awake and alert, much more oriented.   Attempted voiding, toileted patient multiple times, unable to void. Cathed twice this shift, low urine volumes at 6 hours. Talked to patient about drinking more fluids.

## 2020-02-05 NOTE — Progress Notes (Signed)
[facility] PHYSICAL MEDICINE & REHABILITATION PROGRESS NOTE   Subjective/Complaints:    Pt is asleep- per nurse, no issues except required cathing x2 in last 24 hours- at 9pm and 0300 am.    ROS: Limited due to sedation  Objective:   No results found. Recent Labs    02/03/20 0505  WBC 6.0  HGB 10.5*  HCT 31.3*  PLT 130*   Recent Labs    02/03/20 0505  NA 132*  K 4.9  CL 98  CO2 26  GLUCOSE 133*  BUN 32*  CREATININE 1.43*  CALCIUM 9.5    Intake/Output Summary (Last 24 hours) at 02/05/2020 1104 Last data filed at 02/05/2020 1015 Gross per 24 hour  Intake 444 ml  Output 1150 ml  Net -706 ml     Physical Exam: Vital Signs Blood pressure 114/60, pulse 100, temperature 98.3 F (36.8 C), resp. rate 16, weight 73.2 kg, SpO2 92 %.  Physical Exam General: pt asleep- woke for a short minute and went back to sleep, NAD HEENT: sleeping- eyes closed Heart: RRR Chest: CTA B/L- no W/R/R- good air movement Abdomen: Soft, NT, ND, (+)BS  Extremities: No clubbing, cyanosis, or edema. Pulses are 2+ Skin: Left foot dressing in place. R foot with healed TMA amputation Neuro: pt knew in hospital, but said was in [place] class yesterday. Cranial nerves 2-12 are intact.  Psych: perseverative  Assessment/Plan: 1. Functional deficits secondary to acute metabolic encephalopathy/drug overdose and hx of multiple CVAs which require 3+ hours per day of interdisciplinary therapy in a comprehensive inpatient rehab setting.  Physiatrist is providing close team supervision and 24 hour management of active medical problems listed below.  Physiatrist and rehab team continue to assess barriers to discharge/monitor patient progress toward functional and medical goals  Care Tool:  Bathing    Body parts bathed by patient: Right arm, Left arm, Chest, Abdomen, Front perineal area, Right upper leg, Left upper leg, Right lower leg, Left lower leg, Face   Body parts bathed by helper: Buttocks,  Right lower leg, Left lower leg     Bathing assist Assist Level: Moderate Assistance - Patient 50 - 74%     Upper Body Dressing/Undressing Upper body dressing   What is the patient wearing?: Pull over shirt    Upper body assist Assist Level: Supervision/Verbal cueing    Lower Body Dressing/Undressing Lower body dressing      What is the patient wearing?: Pants     Lower body assist Assist for lower body dressing: Maximal Assistance - Patient 25 - 49%     Toileting Toileting    Toileting assist       Transfers Chair/bed transfer  Transfers assist     Chair/bed transfer assist level: Maximal Assistance - Patient 25 - 49% (squat pivot)     Locomotion Ambulation   Ambulation assist   Ambulation activity did not occur: Safety/medical concerns          Walk 10 feet activity   Assist  Walk 10 feet activity did not occur: Safety/medical concerns        Walk 50 feet activity   Assist Walk 50 feet with 2 turns activity did not occur: Safety/medical concerns         Walk 150 feet activity   Assist Walk 150 feet activity did not occur: Safety/medical concerns         Walk 10 feet on uneven surface  activity   Assist Walk 10 feet on uneven surfaces activity  did not occur: Safety/medical concerns         Wheelchair     Assist Will patient use wheelchair at discharge?:  (TBD) Type of Wheelchair: Manual    Wheelchair assist level: Moderate Assistance - Patient 50 - 74% Max wheelchair distance: 119ft    Wheelchair 50 feet with 2 turns activity    Assist        Assist Level: Minimal Assistance - Patient > 75%, Moderate Assistance - Patient 50 - 74%   Wheelchair 150 feet activity     Assist      Assist Level: Maximal Assistance - Patient 25 - 49%   Blood pressure 114/60, pulse 100, temperature 98.3 F (36.8 C), resp. rate 16, weight 73.2 kg, SpO2 92 %.  Medical Problem List and Plan: 1.  Altered mental status  secondary to acute metabolic encephalopathy related to possible drug overdose as well as history of multiple CVAs/vascular dementia             -patient may sponge bathe due to LLE surgery             -ELOS/Goals: modI 7-8 days             -Admit to CIR 2.  Antithrombotics: -DVT/anticoagulation: Eliquis             -antiplatelet therapy: Aspirin 81 mg daily, Plavix 75 mg daily 3. Pain Management: Neurontin 200 mg 3 times daily, Cymbalta 20 mg daily 4. Mood: Provide emotional support             -antipsychotic agents: Haldol 1 mg twice daily, Zyprexa 12.5 mg nightly 5. Neuropsych: This patient is? capable of making decisions on his own behalf. 6. Skin/Wound Care: Routine skin checks 7. Fluids/Electrolytes/Nutrition: Routine in and outs with follow-up chemistries 8.  Atrial fibrillation.  Amiodarone 400 mg daily.  Cardiac rate controlled 9.  Chronic left hallux osteomyelitis.  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 Caroline More.  Weightbearing as tolerated through left heel only with postoperative shoe  9/4- needs cues to remember WB precautions 10.  Hypertension.  Monitor with increased mobility. Well controlled.  11.  Diabetes mellitus with peripheral neuropathy.  Hemoglobin A1c 7.9.  NovoLog 6 units 3 times daily with meals, Lantus insulin 10 units daily.  Diabetic teaching. Uncontrolled. Increase Lantus to 11 U   CBG (last 3)  Recent Labs    02/04/20 1659 02/04/20 2115 02/05/20 0622  GLUCAP 251* 216* 144*    9/4- BGs 118-310- extremely variable- they increased Lantus yesterday- will give 24 hours to see how it works.  9/5- will increase Lantus to 13 units- having 111->300 BGs  12.  CKD stage III as well as bouts of hyperkalemia.  Admission creatinine 1.59.  Continue Lokelma 5 mg twice daily, follow-up chemistries  9/4- Labs Monday 13.  Hyperlipidemia.  Lipitor 14.  CAD with history of pacemaker.  Continue aspirin 15.  Chronic systolic congestive heart failure.  Latest  ejection fraction 40 to 45%.  Monitor for any signs of fluid overload   Filed Weights   02/04/20 0348  Weight: 73.2 kg    09/4- will monitor weight for trend 16.  Urinary retention.  Trial of void   9/4- will remove foley, do bladder scans/PVRs and in/out cath if PVRs >300cc until voiding- is on Flomax.    9/5- has required cathing x2 in last 24 hours- will increase Flomax for now to 0.8 mg daily.   LOS: 2 days A FACE TO  FACE EVALUATION WAS PERFORMED  Preston Garabedian 02/05/2020, 11:04 AM

## 2020-02-06 ENCOUNTER — Inpatient Hospital Stay (HOSPITAL_COMMUNITY): Payer: Medicare Other

## 2020-02-06 ENCOUNTER — Inpatient Hospital Stay (HOSPITAL_COMMUNITY): Payer: Medicare Other | Admitting: Occupational Therapy

## 2020-02-06 LAB — CBC WITH DIFFERENTIAL/PLATELET
Abs Immature Granulocytes: 0.02 10*3/uL (ref 0.00–0.07)
Basophils Absolute: 0 10*3/uL (ref 0.0–0.1)
Basophils Relative: 0 %
Eosinophils Absolute: 0.3 10*3/uL (ref 0.0–0.5)
Eosinophils Relative: 3 %
HCT: 31.9 % — ABNORMAL LOW (ref 39.0–52.0)
Hemoglobin: 9.9 g/dL — ABNORMAL LOW (ref 13.0–17.0)
Immature Granulocytes: 0 %
Lymphocytes Relative: 10 %
Lymphs Abs: 0.9 10*3/uL (ref 0.7–4.0)
MCH: 27.8 pg (ref 26.0–34.0)
MCHC: 31 g/dL (ref 30.0–36.0)
MCV: 89.6 fL (ref 80.0–100.0)
Monocytes Absolute: 0.8 10*3/uL (ref 0.1–1.0)
Monocytes Relative: 9 %
Neutro Abs: 7.3 10*3/uL (ref 1.7–7.7)
Neutrophils Relative %: 78 %
Platelets: UNDETERMINED 10*3/uL (ref 150–400)
RBC: 3.56 MIL/uL — ABNORMAL LOW (ref 4.22–5.81)
RDW: 16.3 % — ABNORMAL HIGH (ref 11.5–15.5)
WBC: 9.3 10*3/uL (ref 4.0–10.5)
nRBC: 0 % (ref 0.0–0.2)

## 2020-02-06 LAB — COMPREHENSIVE METABOLIC PANEL
ALT: 17 U/L (ref 0–44)
AST: 18 U/L (ref 15–41)
Albumin: 2.3 g/dL — ABNORMAL LOW (ref 3.5–5.0)
Alkaline Phosphatase: 83 U/L (ref 38–126)
Anion gap: 9 (ref 5–15)
BUN: 20 mg/dL (ref 8–23)
CO2: 24 mmol/L (ref 22–32)
Calcium: 9.4 mg/dL (ref 8.9–10.3)
Chloride: 97 mmol/L — ABNORMAL LOW (ref 98–111)
Creatinine, Ser: 1.4 mg/dL — ABNORMAL HIGH (ref 0.61–1.24)
GFR calc Af Amer: 60 mL/min — ABNORMAL LOW (ref >60)
GFR calc non Af Amer: 52 mL/min — ABNORMAL LOW (ref >60)
Glucose, Bld: 126 mg/dL — ABNORMAL HIGH (ref 70–99)
Potassium: 4.4 mmol/L (ref 3.5–5.1)
Sodium: 130 mmol/L — ABNORMAL LOW (ref 135–145)
Total Bilirubin: 0.6 mg/dL (ref 0.3–1.2)
Total Protein: 7.9 g/dL (ref 6.5–8.1)

## 2020-02-06 LAB — GLUCOSE, CAPILLARY
Glucose-Capillary: 112 mg/dL — ABNORMAL HIGH (ref 70–99)
Glucose-Capillary: 190 mg/dL — ABNORMAL HIGH (ref 70–99)
Glucose-Capillary: 299 mg/dL — ABNORMAL HIGH (ref 70–99)
Glucose-Capillary: 211 mg/dL — ABNORMAL HIGH (ref 70–99)

## 2020-02-06 MED ORDER — INSULIN GLARGINE 100 UNIT/ML ~~LOC~~ SOLN
14.0000 [IU] | Freq: Every day | SUBCUTANEOUS | Status: DC
  Administered 2020-02-07 – 2020-02-28 (×22): 14 [IU] via SUBCUTANEOUS
  Filled 2020-02-06 (×22): qty 0.14

## 2020-02-06 NOTE — Progress Notes (Signed)
Patient ID: Jason Montes, male   DOB: [birth year], 69 y.o.   MRN: [id number]  Spoke with [facility] branch has discharged this gentleman and will not accept him back. Will find another home health agency to take pt for follow up.

## 2020-02-06 NOTE — Progress Notes (Signed)
Occupational Therapy Session Note  Patient Details  Name: Jason Montes MRN: [id number] Date of Birth: 02/12/1951  Today's Date: 02/06/2020 OT Individual Time: 1300-1400 OT Individual Time Calculation (min): 60 min    Short Term Goals: Week 1:  OT Short Term Goal 1 (Week 1): Pt will perform toilet transfers with Min A OT Short Term Goal 2 (Week 1): Pt will transition sit to stand with proper body mechanics with Min A in prep for LB ADLs OT Short Term Goal 3 (Week 1): Pt will perform LB dress with AE PRN with Min A OT Short Term Goal 4 (Week 1): Pt will don post surgical shoe and R regular shoe with Min A  Skilled Therapeutic Interventions/Progress Updates:    Patient seated in w/c, alert and oriented to month and year.  Cues for place and date.  He is pleasant and cooperative.  Completed short distance ambulation with RW CGA/min A w/c to/from mat table with cues and assist for posterior lean and walker placement.  He tolerated unsupported sitting with CS for trunk, UB and LB conditioning and mobility activities.  Sit to stand from mat surface t/o session for standing balance, coordination and endurance activities with CG/min A.   Completed 5 minutes on ergometer.  He remained seated at close of session with seat belt alarm set and call bell in reach.    Therapy Documentation Precautions:  Precautions Precautions: Fall, Other (comment) Precaution Comments: LLE WBAT heel only with post-op shoe Required Braces or Orthoses: Other Brace Other Brace: postop shoe Restrictions Weight Bearing Restrictions: Yes RLE Weight Bearing: Weight bearing as tolerated LLE Weight Bearing: Weight bearing as tolerated LLE Partial Weight Bearing Percentage or Pounds: WBAT through heel with boot on   Therapy/Group: Individual Therapy  Carlos Levering 02/06/2020, 7:39 AM

## 2020-02-06 NOTE — Progress Notes (Signed)
Speech Language Pathology Daily Session Note  Patient Details  Name: Jason Montes MRN: [id number] Date of Birth: 1950/10/30  Today's Date: 02/06/2020 SLP Individual Time: 1100-1200 SLP Individual Time Calculation (min): 60 min  Short Term Goals: Week 1: SLP Short Term Goal 1 (Week 1): Patient will recall basic, daily information with mod A verbal cues for use of memory compensatory strategies. SLP Short Term Goal 2 (Week 1): Patient will demonstrate functional problem solving for basic and familiar tasks with supervison verbal cues. SLP Short Term Goal 3 (Week 1): Patient will self-monitor and correct errors during functional tasks with supervision verbal cues.  Skilled Therapeutic Interventions: Skilled SLP intervention focused on cognition. Pt unaware that red button at top of remote on hospital bed is to call for help. Pt pressed this button when attempting to turn off tv and needed mod cues to increase awareness of purpose of this button vs on/off button for tv. Mod A with visual and verbal cues when using hospital phone to call in lunch. Pt reported he hasnt ordered lunch yet however dietary staff on phone informed pt he already ordered yesterday. Min A with verbal cues to direct pt with ordering todays dinner and tomorrows breakfast. Educated pt on use of wall calendar or desktop planner to use once discharged to increase recall of scheduled appointments. Pt agreed and stated  "I need my daughter to buy me one of these". Reviewed schedule posted in room with min a verbal cues to orient to time left before next session. Pt recalled 3 tasks completed in previous PT session this morning with Min a verbal cues. Pt left seated in chair with chair alarm on and all needs within reach. Cont with therapy per plan ofcare.      Pain Pain Assessment Pain Scale: Faces Faces Pain Scale: No hurt  Therapy/Group: Individual Therapy  Jason Montes Jason Montes 02/06/2020, 12:06 PM

## 2020-02-06 NOTE — Progress Notes (Signed)
Patient toileted using steady but unable to void, BSCAN around 60 ML. Encouraged to drink water before noon. Atttempted to void again, unable.At 8 hour no void so cath patient   Abdominal binder ordered and brought to patient room for therapy

## 2020-02-06 NOTE — Progress Notes (Signed)
Patient Details  Name: Jason Montes MRN: [id number] Date of Birth: 12/27/1950  Today's Date: 02/06/2020  Hospital Problems: Principal Problem:   Acute metabolic encephalopathy  Past Medical History:  Past Medical History:  Diagnosis Date  . Allergy   . Atrial fibrillation ([place])   . B12 deficiency   . Chronic kidney disease   . Chronic kidney disease (CKD), stage II (mild)   . Coronary atherosclerosis   . Diabetes mellitus without complication ([place])   . Elevated PSA   . Esophagitis   . GERD (gastroesophageal reflux disease)   . Heart attack ([place])   . Heart disease   . Hyperlipidemia   . Hypertension   . Hypokalemia   . Mild neurocognitive disorder   . Osteoarthritis   . Presbyopia   . PVD (peripheral vascular disease) ([place])   . Restless leg syndrome   . Stroke (cerebrum) ([place])   . Stroke ([place])   . Subacute osteomyelitis ([place])   . Tinea unguium   . Uncompensated short term memory deficit    Past Surgical History:  Past Surgical History:  Procedure Laterality Date  . AMPUTATION TOE Left 01/13/2020   Procedure: AMPUTATION RAY LEFT 1ST;  Surgeon: Caroline More, DPM;  Location: [facility] ORS;  Service: Podiatry;  Laterality: Left;  . CARDIAC PACEMAKER PLACEMENT    . COLONOSCOPY WITH PROPOFOL N/A 05/18/2018   Procedure: COLONOSCOPY WITH PROPOFOL;  Surgeon: [name], MD;  Location: [facility] ENDOSCOPY;  Service: Endoscopy;  Laterality: N/A;  . CORONARY ARTERY BYPASS GRAFT    . FOOT AMPUTATION Right   . INTRAMEDULLARY (IM) NAIL INTERTROCHANTERIC Right 11/16/2019   Procedure: INTRAMEDULLARY (IM) NAIL INTERTROCHANTRIC;  Surgeon: Thornton Park, MD;  Location: [facility] ORS;  Service: Orthopedics;  Laterality: Right;  . LOWER EXTREMITY ANGIOGRAPHY Left 10/10/2019   Procedure: Lower Extremity Angiography;  Surgeon: Algernon Huxley, MD;  Location: [facility] CV LAB;  Service: Cardiovascular;  Laterality: Left;  . LOWER EXTREMITY ANGIOGRAPHY Left 01/16/2020   Procedure: Lower  Extremity Angiography;  Surgeon: Algernon Huxley, MD;  Location: [facility];  Service: Cardiovascular;  Laterality: Left;   Social History:  reports that he has never smoked. He has never used smokeless tobacco. He reports that he does not drink alcohol and does not use drugs.  Family / Support Systems Marital Status: Single Patient Roles: Parent, Other (Comment) (Friend) Children: Heather-daughter [phone]-cell Other Supports: [name]-son in-law [phone]-cell  Amy-friend at borading house Anticipated Caregiver: Self and Amy-friend Ability/Limitations of Caregiver: Pt unable to go stay with daughter or in a SNF due to legal issues Caregiver Availability: Intermittent Family Dynamics: Daughter makes sure he has care, but is not involved with him. Pt has begun paying Amy-friend out of pocket to assist him at the boaridng house.  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 He wants to be able to take care of himself and require little assist. He only has intermittent assist and wants to manage better at home than he was prior to admission  Recent Psychosocial Issues: other health issues and legal issues-which limit his choices Psychiatric History: History of depression takes medications for this which he feels helps, ut would benefit from seeing neruo-psych while here if still here when MD  returns from being off. Pt was seen by psych at [facility] and cleared questions when admitted of overdose but viewed as unintentional Substance Abuse History: No issues  Patient / Family Perceptions, Expectations & Goals Pt/Family understanding of illness & functional limitations: Pt and son in-law have a basic understanding of his medical issues and treatment plan going forward. He talks with the MD daily and feels he is becoming clearer each day while here. Premorbid pt/family roles/activities: Retiree, father, friend, etc Anticipated changes in roles/activities/participation: resume Pt/family expectations/goals: Pt states: " I want to be able to take care of myself by the time I leave here."  Son in-law states: " He is going home no matter what level he is."  [employer]: Other (Comment) ([facility] not always dependable) Premorbid Home Care/DME Agencies: Other (Comment) (has wc, rw, bsc followed by [facility]) Transportation available at discharge: Amy does drive and he reports daughter takes him to his MD appointments Resource referrals recommended: Neuropsychology  Discharge Planning Living Arrangements: Other (Comment) (Boarding house-has own room share bathroom) Support Systems: Other relatives, Friends/neighbors, Children Type of Residence: Private residence Insurance Resources: [employer], [employer] (specify county) Pensions consultant: [place] Referred: No Living Expenses: Education officer, community Management: Patient Does the patient have any problems obtaining your medications?: No Home Management: Amy ssist him with this-gets his groceries for him Patient/Family Preliminary Plans: Return home with assist from [facility] and Amy-friend he pays to assist him. He has needed equipment and was active with [facility] for follow up. Made aware will be here 3 weeks and will know target discharge date on Wed. Care Coordinator Barriers to Discharge: Decreased caregiver  support Care Coordinator Barriers to Discharge Comments: Will need to be mod/i-supervision to be able to return home safely Care Coordinator Anticipated Follow Up Needs: HH/OP  Clinical Impression Pleasant gentleman who seems to be getting clearer mentally each day. He has resources in place-PCS and hired friend to assist him at discharge. He will need to be mod/i to due to being alone in his room. Will follow along to assist with discharge needs. Would benefit from seeing neuro-psych while here.  Elease Hashimoto 02/06/2020, 10:05 AM

## 2020-02-06 NOTE — Progress Notes (Signed)
[facility] Individual Statement of Services  Patient Name:  Jason Montes  Date:  02/06/2020  Welcome to the [facility].  Our goal is to provide you with an individualized program based on your diagnosis and situation, designed to meet your specific needs.  With this comprehensive rehabilitation program, you will be expected to participate in at least 3 hours of rehabilitation therapies Monday-Friday, with modified therapy programming on the weekends.  Your rehabilitation program will include the following services:  Physical Therapy (PT), Occupational Therapy (OT), Speech Therapy (ST), 24 hour per day rehabilitation nursing, Neuropsychology, Care Coordinator, Rehabilitation Medicine, Nutrition Services and Pharmacy Services  Weekly team conferences will be held on Wednesday to discuss your progress.  Your Inpatient Rehabilitation Care Coordinator will talk with you frequently to get your input and to update you on team discussions.  Team conferences with you and your family in attendance may also be held.  Expected length of stay: 21 days  Overall anticipated outcome: supervision with cues  Depending on your progress and recovery, your program may change. Your Inpatient Rehabilitation Care Coordinator will coordinate services and will keep you informed of any changes. Your Inpatient Rehabilitation Care Coordinator's name and contact numbers are listed  below.  The following services may also be recommended but are not provided by the [facility]:    [place] will be made to provide these services after discharge if needed.  Arrangements include referral to agencies that provide these services.  Your insurance has been verified to be:  Medicare A & B Your primary doctor is:  [facility]  Pertinent information will be shared with your doctor  and your insurance company.  Inpatient Rehabilitation Care Coordinator:  Ovidio Kin, [place] or Emilia Beck  Information discussed with and copy given to patient by: Elease Hashimoto, 02/06/2020, 8:47 AM

## 2020-02-06 NOTE — Progress Notes (Signed)
[facility] PHYSICAL MEDICINE & REHABILITATION PROGRESS NOTE   Subjective/Complaints: No complaints this morning. Had an appointment with ortho this morning, asks for when he should reschedule this. Orthostatic with therapy this morning.    ROS: Limited due to sedation  Objective:   No results found. Recent Labs    02/06/20 0726  WBC 9.3  HGB 9.9*  HCT 31.9*  PLT PLATELET CLUMPS NOTED ON SMEAR, UNABLE TO ESTIMATE   Recent Labs    02/06/20 0726  NA 130*  K 4.4  CL 97*  CO2 24  GLUCOSE 126*  BUN 20  CREATININE 1.40*  CALCIUM 9.4    Intake/Output Summary (Last 24 hours) at 02/06/2020 0912 Last data filed at 02/06/2020 0810 Gross per 24 hour  Intake 200 ml  Output 1300 ml  Net -1100 ml     Physical Exam: Vital Signs Blood pressure 132/65, pulse 85, temperature 97.9 F (36.6 C), resp. rate 18, weight 73.3 kg, SpO2 96 %.  Physical Exam General: Alert and oriented x 1, No apparent distress HEENT: Head is normocephalic, atraumatic, PERRLA, EOMI, sclera anicteric, oral mucosa pink and moist, dentition intact, ext ear canals clear,  Neck: Supple without JVD or lymphadenopathy Heart: Reg rate and rhythm. No murmurs rubs or gallops Chest: CTA bilaterally without wheezes, rales, or rhonchi; no distress Abdomen: Soft, non-tender, non-distended, bowel sounds positive. Extremities: No clubbing, cyanosis, or edema. Pulses are 2+ Skin: Left foot dressing in place. R foot with healed TMA amputation Neuro: Says he is in [place] initially, but the corrects to [place]. Incorrect date. AOx1 to person. Cranial nerves 2-12 are intact. Strength intact except left foot.  Psych: perseverative. Pt is cooperative   Assessment/Plan: 1. Functional deficits secondary to acute metabolic encephalopathy/drug overdose and hx of multiple CVAs which require 3+ hours per day of interdisciplinary therapy in a comprehensive inpatient rehab setting.  Physiatrist is providing close team  supervision and 24 hour management of active medical problems listed below.  Physiatrist and rehab team continue to assess barriers to discharge/monitor patient progress toward functional and medical goals  Care Tool:  Bathing    Body parts bathed by patient: Right arm, Left arm, Chest, Abdomen, Front perineal area, Right upper leg, Left upper leg, Right lower leg, Left lower leg, Face   Body parts bathed by helper: Buttocks, Right lower leg, Left lower leg     Bathing assist Assist Level: Moderate Assistance - Patient 50 - 74%     Upper Body Dressing/Undressing Upper body dressing   What is the patient wearing?: Pull over shirt    Upper body assist Assist Level: Supervision/Verbal cueing    Lower Body Dressing/Undressing Lower body dressing      What is the patient wearing?: Pants     Lower body assist Assist for lower body dressing: Maximal Assistance - Patient 25 - 49%     Toileting Toileting    Toileting assist Assist for toileting: Dependent - Patient 0% (requires in and out cath)     Transfers Chair/bed transfer  Transfers assist     Chair/bed transfer assist level: Maximal Assistance - Patient 25 - 49% (squat pivot)     Locomotion Ambulation   Ambulation assist   Ambulation activity did not occur: Safety/medical concerns          Walk 10 feet activity   Assist  Walk 10 feet activity did not occur: Safety/medical concerns        Walk 50 feet activity   Assist Walk 50  feet with 2 turns activity did not occur: Safety/medical concerns         Walk 150 feet activity   Assist Walk 150 feet activity did not occur: Safety/medical concerns         Walk 10 feet on uneven surface  activity   Assist Walk 10 feet on uneven surfaces activity did not occur: Safety/medical concerns         Wheelchair     Assist Will patient use wheelchair at discharge?:  (TBD) Type of Wheelchair: Manual    Wheelchair assist level:  Moderate Assistance - Patient 50 - 74% Max wheelchair distance: 130ft    Wheelchair 50 feet with 2 turns activity    Assist        Assist Level: Minimal Assistance - Patient > 75%, Moderate Assistance - Patient 50 - 74%   Wheelchair 150 feet activity     Assist      Assist Level: Maximal Assistance - Patient 25 - 49%   Blood pressure 132/65, pulse 85, temperature 97.9 F (36.6 C), resp. rate 18, weight 73.3 kg, SpO2 96 %.  Medical Problem List and Plan: 1.  Altered mental status secondary to acute metabolic encephalopathy related to possible drug overdose as well as history of multiple CVAs/vascular dementia             -patient may sponge bathe due to LLE surgery             -ELOS/Goals: modI 7-8 days             -Continue CIR 2.  Antithrombotics: -DVT/anticoagulation: Eliquis             -antiplatelet therapy: Aspirin 81 mg daily, Plavix 75 mg daily 3. Pain Management: Neurontin 200 mg 3 times daily, Cymbalta 20 mg daily. Pain is well controlled.  4. Mood: Provide emotional support             -antipsychotic agents: Haldol 1 mg twice daily, Zyprexa 12.5 mg nightly 5. Neuropsych: This patient is? capable of making decisions on his own behalf. 6. Skin/Wound Care: Routine skin checks 7. Fluids/Electrolytes/Nutrition: Routine in and outs with follow-up chemistries 8.  Atrial fibrillation.  Amiodarone 400 mg daily.  Cardiac rate controlled 9.  Chronic left hallux osteomyelitis.  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 Caroline More.  Weightbearing as tolerated through left heel only with postoperative shoe  9/4- needs cues to remember WB precautions 10.  Hypertension.  Monitor with increased mobility. Well controlled.  11.  Diabetes mellitus with peripheral neuropathy.  Hemoglobin A1c 7.9.  NovoLog 6 units 3 times daily with meals, Lantus insulin 10 units daily on admission/  Diabetic teaching. Uncontrolled. Increase Lantus to 14U. Discussed replacing diet coke  with water.    CBG (last 3)  Recent Labs    02/05/20 1712 02/05/20 2139 02/06/20 0559  GLUCAP 376* 308* 112*   12.  CKD stage III as well as bouts of hyperkalemia.  Admission creatinine 1.59.  Continue Lokelma 5 mg twice daily, follow-up chemistries  Creatinine stable at 1.4 on 9/6 13.  Hyperlipidemia.  Lipitor 14.  CAD with history of pacemaker.  Continue aspirin 15.  Chronic systolic congestive heart failure.  Latest ejection fraction 40 to 45%.  Monitor for any signs of fluid overload   Filed Weights   02/04/20 0348 02/06/20 0422  Weight: 73.2 kg 73.3 kg    09/4- will monitor weight for trend 16.  Urinary retention.  Trial of void  9/4- will remove foley, do bladder scans/PVRs and in/out cath if PVRs >300cc until voiding- is on Flomax.    9/5- has required cathing x2 in last 24 hours- will increase Flomax for now to 0.8 mg daily.  17. Orthostatic hypotension: abdominal binder and TEDs ordered  LOS: 3 days A FACE TO FACE EVALUATION WAS PERFORMED  Theron Cumbie P Breionna Punt 02/06/2020, 9:12 AM

## 2020-02-06 NOTE — Progress Notes (Signed)
Physical Therapy Session Note  Patient Details  Name: Jason Montes MRN: [id number] Date of Birth: [birth year]  Today's Date: 02/06/2020 PT Individual Time: 0801-0915 PT Individual Time Calculation (min): 74 min   Short Term Goals: Week 1:  PT Short Term Goal 1 (Week 1): Pt will perform supine<>sit with min assist PT Short Term Goal 2 (Week 1): Pt will perform sit<>stands using LRAD with min assist PT Short Term Goal 3 (Week 1): Pt will perform bed<>chair transfers with mod assist PT Short Term Goal 4 (Week 1): Pt will ambulate at least 77ft using LRAD with mod assist  Skilled Therapeutic Interventions/Progress Updates:   Received pt supine in bed stating his back hurts from laying in the bed but otherwise no c/o pain; pt agreeable to therapy. Session with emphasis on functional mobility/transfers, dressing, generalized strengthening, dynamic standing balance/coordination, simulated car transfers, and improved upright activity tolerance. Donned pants in supine with mod A and pt rolled L and R with supervision and use of bedrails and required mod A to pull pants over hips. Pt transferred supine<>sitting EOB with mod A for trunk control and LE management and donned L LE post-op shoe total A and regular shoe on R LE with total A. Pt doffed dirty scrub top and donned clean one with mod A and min A for sitting balance with cues for anterior weight shifting. Pt transferred sit<>stand with RW and mod A to assess orthostatics; noted pt with moderate posterior lean. Pt then transferred bed<>WC stand<>pivot with RW and mod A with cues for anterior weight shifting and hand placement. 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 Upon sitting in WC pt reported feeling "foggy" but requested to remain in [place]. Pt sat in [facility] and brushed teeth and washed face at sink with supervision. BP while sitting in WC brushing teeth 101/70 and pt reported feeling better.    Orthostatic Vitals: Supine: BP 88/54 ; pt asymptomatic Sitting EOB: BP 109/71; pt reported increase dizziness that resolved in <2 minutes Standing: BP 74/49; pt reported increased dizziness but stated "this is normal for me"  RN present to administer medications and PT updated RN on orthostatics. Pt performed WC mobility 29ft using bilateral UEs and supervision; transported remainder of way to ortho gym total A due to fatigue. Pt performed simulated car transfer with RW and max A with cues for turning technique and RW safety. Pt reported increased dizziness with transfer that resolved in <2 minutes. BP 116/61. Pt transported back to room in [facility] total A and worked on standing tolerance for the following time frames: Trial 1: 1 minute 30 seconds; requesting to sit due to LE weakness; pt asymptomatic. Trial 2: 1 minute 50 seconds; pt asymptomatic Educated pt on benefits of upright tolerance to improve orthostatics and pt agreeable to remain up in [place]. MD present at end of session for morning rounds and therapist updated MD on orthostatics; MD ordering abdominal binder. Concluded session with pt sitting in WC, needs within reach, and seatbelt alarm on. Therapist provided fresh drink for pt.   Therapy Documentation Precautions:  Precautions Precautions: Fall, Other (comment) Precaution Comments: LLE WBAT heel only with post-op shoe Required Braces or Orthoses: Other Brace Other Brace: postop shoe Restrictions Weight Bearing Restrictions: Yes RLE Weight Bearing: Weight bearing as tolerated LLE Weight Bearing: Weight bearing as tolerated LLE Partial Weight Bearing Percentage or Pounds: WBAT through heel with boot on   Therapy/Group: Individual Therapy Betsey Holiday  [place], DPT   02/06/2020, 7:18 AM

## 2020-02-06 NOTE — IPOC Note (Signed)
Overall Plan of Care [facility]) Patient Details Name: LASZLO ELLERBY MRN: [id number] DOB: 30-Jul-1950  Admitting Diagnosis: Acute metabolic encephalopathy  Hospital Problems: Principal Problem:   Acute metabolic encephalopathy     Functional Problem List: Nursing Bladder, Bowel, Endurance, Medication Management, Nutrition, Pain, Safety, Skin Integrity, Behavior  PT Balance, Pain, Behavior, Perception, Edema, Safety, Endurance, Sensory, Motor, Nutrition, Skin Integrity  OT Balance, Cognition, Endurance, Motor, Safety, Vision  SLP Cognition  TR         Basic ADLs: OT Grooming, Bathing, Dressing, Toileting     Advanced  ADLs: OT       Transfers: PT Bed Mobility, Bed to Chair, Musician, Manufacturing systems engineer, Metallurgist: PT Ambulation, Emergency planning/management officer, Stairs     Additional Impairments: OT None  SLP Social Cognition   Memory, Problem Solving, Awareness  TR      Anticipated Outcomes Item Anticipated Outcome  Self Feeding Indep  Swallowing      Basic self-care  Mod I - Supervision  Toileting  Mod I   Bathroom Transfers Mod I - Supervision  Bowel/Bladder  patient will be continent of bowel and bladder with min assist  Transfers  supervision  Locomotion  supervision  Communication     Cognition  Supervision  Pain  papin less than or equal to 4/10 with min assist  Safety/Judgment  Free from falls/injury and displaying appropriate safety judgement   Therapy Plan: PT Intensity: Minimum of 1-2 x/day ,45 to 90 minutes PT Frequency: 5 out of 7 days PT Duration Estimated Length of Stay: ~3 weeks OT Intensity: Minimum of 1-2 x/day, 45 to 90 minutes OT Frequency: 5 out of 7 days OT Duration/Estimated Length of Stay: 21-24 days SLP Intensity: Minumum of 1-2 x/day, 30 to 90 minutes SLP Frequency: 3 to 5 out of 7 days SLP Duration/Estimated Length of Stay: 21-24 days   Due to the current state of emergency, patients may not be receiving their  3-hours of Medicare-mandated therapy.   Team Interventions: Nursing Interventions Patient/Family Education, Bladder Management, Bowel Management, Disease Management/Prevention, Pain Management, Medication Management, Skin Care/Wound Management, Discharge Planning  PT interventions Ambulation/gait training, Cognitive remediation/compensation, Discharge planning, DME/adaptive equipment instruction, Functional mobility training, Pain management, Psychosocial support, Splinting/orthotics, Therapeutic Activities, UE/LE Strength taining/ROM, Visual/perceptual remediation/compensation, Training and development officer, Community reintegration, Disease management/prevention, Functional electrical stimulation, Neuromuscular re-education, Patient/family education, Skin care/wound management, Stair training, Therapeutic Exercise, UE/LE Coordination activities, Wheelchair propulsion/positioning  OT Interventions Training and development officer, Discharge planning, Self Care/advanced ADL retraining, Therapeutic Activities, UE/LE Coordination activities, Visual/perceptual remediation/compensation, Therapeutic Exercise, Skin care/wound managment, Patient/family education, Functional mobility training, Disease mangement/prevention, Academic librarian, Engineer, drilling, Neuromuscular re-education, Psychosocial support, UE/LE Strength taining/ROM, Wheelchair propulsion/positioning  SLP Interventions Cognitive remediation/compensation, Internal/external aids, Therapeutic Activities, Cueing hierarchy, Environmental controls, Functional tasks, Patient/family education  TR Interventions    SW/CM Interventions Discharge Planning, Psychosocial Support, Patient/Family Education   Barriers to Discharge MD  Medical stability  Nursing      PT Inaccessible home environment, Decreased caregiver support, Home environment access/layout, Lack of/limited family support, Weight bearing restrictions    OT Decreased  caregiver support, Weight bearing restrictions    SLP Decreased caregiver support    SW Decreased caregiver support Will need to be mod/i-supervision to be able to return home safely   Team Discharge Planning: Destination: PT-Home ,OT- Home (boarding house) , SLP-Home Projected Follow-up: PT-Home health PT, 24 hour supervision/assistance, OT-  Home health OT, SLP-None Projected Equipment Needs: PT-To be determined, OT- To be  determined, SLP-None recommended by SLP Equipment Details: PT- , OT-pt has shower seat for shared walk in shower Patient/family involved in discharge planning: PT- Patient,  OT-Patient, SLP-Patient  MD ELOS: modI 7-8 days Medical Rehab Prognosis:  Excellent Assessment: Mr. Chretien is a 69 year old man admitted to CIR with altered mental status secondary to acute metabolic encephalopathy related to possible drug overdose as well as history of multiple CVAs/vascular dementia. Active medical issues include uncontrolled diabetes mellitus 2 (managed with increasing Lantus and diet education), AKI (managed with weekly creatinine check and education regarding hydration), urinary retention (managed with voiding trial, cath prn), and orthostatic hypotension (managed with frequent BP checks, abdominal binder, and TED hose).     See Team Conference Notes for weekly updates to the plan of care

## 2020-02-07 ENCOUNTER — Inpatient Hospital Stay (HOSPITAL_COMMUNITY): Payer: Medicare Other

## 2020-02-07 ENCOUNTER — Inpatient Hospital Stay (HOSPITAL_COMMUNITY): Payer: Medicare Other | Admitting: Occupational Therapy

## 2020-02-07 DIAGNOSIS — I4891 Unspecified atrial fibrillation: Secondary | ICD-10-CM

## 2020-02-07 DIAGNOSIS — I10 Essential (primary) hypertension: Secondary | ICD-10-CM

## 2020-02-07 DIAGNOSIS — R7309 Other abnormal glucose: Secondary | ICD-10-CM

## 2020-02-07 DIAGNOSIS — R339 Retention of urine, unspecified: Secondary | ICD-10-CM

## 2020-02-07 DIAGNOSIS — G8918 Other acute postprocedural pain: Secondary | ICD-10-CM

## 2020-02-07 DIAGNOSIS — I5022 Chronic systolic (congestive) heart failure: Secondary | ICD-10-CM

## 2020-02-07 DIAGNOSIS — E1142 Type 2 diabetes mellitus with diabetic polyneuropathy: Secondary | ICD-10-CM

## 2020-02-07 LAB — GLUCOSE, CAPILLARY
Glucose-Capillary: 202 mg/dL — ABNORMAL HIGH (ref 70–99)
Glucose-Capillary: 183 mg/dL — ABNORMAL HIGH (ref 70–99)
Glucose-Capillary: 308 mg/dL — ABNORMAL HIGH (ref 70–99)
Glucose-Capillary: 246 mg/dL — ABNORMAL HIGH (ref 70–99)
Glucose-Capillary: 163 mg/dL — ABNORMAL HIGH (ref 70–99)
Glucose-Capillary: 88 mg/dL (ref 70–99)

## 2020-02-07 NOTE — Plan of Care (Signed)
  Problem: Consults Goal: RH GENERAL PATIENT EDUCATION Description: See Patient Education module for education specifics. Outcome: Progressing Goal: Nutrition Consult-if indicated Outcome: Progressing Goal: Diabetes Guidelines if Diabetic/Glucose > 140 Description: If diabetic or lab glucose is > 140 mg/dl - Initiate Diabetes/Hyperglycemia Guidelines & Document Interventions  Outcome: Progressing   Problem: RH BOWEL ELIMINATION Goal: RH STG MANAGE BOWEL WITH ASSISTANCE Description: STG Manage Bowel with  mod I Assistance. Outcome: Progressing Goal: RH STG MANAGE BOWEL W/MEDICATION W/ASSISTANCE Description: STG Manage Bowel with Medication with mod i Assistance. Outcome: Progressing   Problem: RH BLADDER ELIMINATION Goal: RH STG MANAGE BLADDER WITH ASSISTANCE Description: STG Manage Bladder With  mod I Assistance Outcome: Progressing Goal: RH STG MANAGE BLADDER WITH MEDICATION WITH ASSISTANCE Description: STG Manage Bladder With Medication With  mod I Assistance. Outcome: Progressing   Problem: RH SKIN INTEGRITY Goal: RH STG SKIN FREE OF INFECTION/BREAKDOWN Description: Patient will be able to manage skin with min assist Outcome: Progressing Goal: RH STG MAINTAIN SKIN INTEGRITY WITH ASSISTANCE Description: STG Maintain Skin Integrity With min Assistance. Outcome: Progressing Goal: RH STG ABLE TO PERFORM INCISION/WOUND CARE W/ASSISTANCE Description: STG Able To Perform Incision/Wound Care With  min Assistance. Outcome: Progressing   Problem: RH SAFETY Goal: RH STG ADHERE TO SAFETY PRECAUTIONS W/ASSISTANCE/DEVICE Description: STG Adhere to Safety Precautions With cues/reminders  Assistance/Device. Outcome: Progressing Goal: RH STG DECREASED RISK OF FALL WITH ASSISTANCE Description: STG Decreased Risk of Fall With cues/reminders Assistance. Outcome: Progressing   Problem: RH PAIN MANAGEMENT Goal: RH STG PAIN MANAGED AT OR BELOW PT'S PAIN GOAL Description: Pain less than or  equal to 2. Outcome: Progressing   Problem: RH KNOWLEDGE DEFICIT GENERAL Goal: RH STG INCREASE KNOWLEDGE OF SELF CARE AFTER HOSPITALIZATION Description: Patient will be able to manage care with cues/reminders using handouts and educational resources with cues/reminders Outcome: Progressing

## 2020-02-07 NOTE — Progress Notes (Addendum)
Patient ID: Jason Montes, male   DOB: 06-24-1950, 69 y.o.   MRN: [id number] Met with the patient to review the role of the nurse CM and discuss educational needs and concerns. Reviewed HF and DM with A1C - 7.9 down from 11.4 however the patient did have osteomyelitis.Patient reported that he took Lantus in the morning PTA and a weekly injection for his diabetes. Currently on meal coverage however denied using meal coverage PTA. Reported he had a foam on his sacral stage 2 and dressing on his left foot. Discussed help at his home and request to change [facility] agency and has 6 steps to entry of home with railing on left. No other issues noted at present. Continue to follow along. Margarito Liner

## 2020-02-07 NOTE — Progress Notes (Signed)
[facility] PHYSICAL MEDICINE & REHABILITATION PROGRESS NOTE   Subjective/Complaints: Patient seen sitting up in bed this AM.  He states he slept well overnight.   ROS: Appears to deny CP, SOB, N/V/D.  Objective:   No results found. Recent Labs    02/06/20 0726  WBC 9.3  HGB 9.9*  HCT 31.9*  PLT PLATELET CLUMPS NOTED ON SMEAR, UNABLE TO ESTIMATE   Recent Labs    02/06/20 0726  NA 130*  K 4.4  CL 97*  CO2 24  GLUCOSE 126*  BUN 20  CREATININE 1.40*  CALCIUM 9.4    Intake/Output Summary (Last 24 hours) at 02/07/2020 0935 Last data filed at 02/07/2020 0827 Gross per 24 hour  Intake 820 ml  Output 1400 ml  Net -580 ml     Physical Exam: Vital Signs Blood pressure (!) 117/57, pulse 85, temperature 97.6 F (36.4 C), resp. rate 18, weight 74.9 kg, SpO2 95 %. Constitutional: No distress . Vital signs reviewed. HENT: Normocephalic.  Atraumatic. Eyes: EOMI. No discharge. Cardiovascular: No JVD. Respiratory: Normal effort.  No stridor. GI: Non-distended. Skin: Abrasions with dressing CDI Left foot dressing Psych: Somewhat slowed. Musc: Left great toe amputation Neuro: Alert and oriented x2 Motor: 4+/5 throughout, except left foot RUE resting tremor  Assessment/Plan: 1. Functional deficits secondary to acute metabolic encephalopathy/drug overdose and hx of multiple CVAs which require 3+ hours per day of interdisciplinary therapy in a comprehensive inpatient rehab setting.  Physiatrist is providing close team supervision and 24 hour management of active medical problems listed below.  Physiatrist and rehab team continue to assess barriers to discharge/monitor patient progress toward functional and medical goals  Care Tool:  Bathing    Body parts bathed by patient: Right arm, Left arm, Chest, Abdomen, Front perineal area, Right upper leg, Left upper leg, Right lower leg, Left lower leg, Face   Body parts bathed by helper: Buttocks, Right lower leg, Left lower leg      Bathing assist Assist Level: Moderate Assistance - Patient 50 - 74%     Upper Body Dressing/Undressing Upper body dressing   What is the patient wearing?: Pull over shirt    Upper body assist Assist Level: Supervision/Verbal cueing    Lower Body Dressing/Undressing Lower body dressing      What is the patient wearing?: Pants     Lower body assist Assist for lower body dressing: Maximal Assistance - Patient 25 - 49%     Toileting Toileting    Toileting assist Assist for toileting: Dependent - Patient 0%     Transfers Chair/bed transfer  Transfers assist     Chair/bed transfer assist level: Moderate Assistance - Patient 50 - 74% (RW)     Locomotion Ambulation   Ambulation assist   Ambulation activity did not occur: Safety/medical concerns          Walk 10 feet activity   Assist  Walk 10 feet activity did not occur: Safety/medical concerns        Walk 50 feet activity   Assist Walk 50 feet with 2 turns activity did not occur: Safety/medical concerns         Walk 150 feet activity   Assist Walk 150 feet activity did not occur: Safety/medical concerns         Walk 10 feet on uneven surface  activity   Assist Walk 10 feet on uneven surfaces activity did not occur: Safety/medical concerns         Wheelchair  Assist Will patient use wheelchair at discharge?: No (No PT long term goals ) Type of Wheelchair: Manual    Wheelchair assist level: Minimal Assistance - Patient > 75% Max wheelchair distance: 56ft    Wheelchair 50 feet with 2 turns activity    Assist        Assist Level: Minimal Assistance - Patient > 75%, Moderate Assistance - Patient 50 - 74%   Wheelchair 150 feet activity     Assist      Assist Level: Maximal Assistance - Patient 25 - 49%   Blood pressure (!) 117/57, pulse 85, temperature 97.6 F (36.4 C), resp. rate 18, weight 74.9 kg, SpO2 95 %.  Medical Problem List and Plan: 1.   Altered mental status secondary to acute metabolic encephalopathy related to possible drug overdose as well as history of multiple CVAs/vascular dementia  Continue CIR 2.  Antithrombotics: -DVT/anticoagulation: Eliquis             -antiplatelet therapy: Aspirin 81 mg daily, Plavix 75 mg daily 3. Pain Management: Neurontin 200 mg 3 times daily, Cymbalta 20 mg daily.   Controlled on 9/7 4. Mood: Provide emotional support             -antipsychotic agents: Haldol 1 mg BID, Zyprexa 12.5 mg nightly 5. Neuropsych: This patient is not fully capable of making decisions on his own behalf. 6. Skin/Wound Care: Routine skin checks 7. Fluids/Electrolytes/Nutrition: Routine in and outs with follow-up chemistries 8.  Atrial fibrillation.  Amiodarone 400 mg daily.  Controlled on 9/7 9.  Chronic left hallux osteomyelitis.  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 Caroline More.  Weightbearing as tolerated through left heel only with postoperative shoe  Needs cues to remember WB precautions 10.  Hypertension.  Monitor with increased mobility.   Relatively controlled on 9/7 11.  Diabetes mellitus with peripheral neuropathy.  Hemoglobin A1c 7.9.  NovoLog 6 units 3 times daily with meals, Lantus insulin 10 units daily on admission.  Diabetic teaching.   Increased Lantus to 14U on 9/7.   CBG (last 3)  Recent Labs    02/06/20 2116 02/07/20 0611 02/07/20 0751  GLUCAP 211* 202* 183*   Labile on 9/7 12.  CKD stage III as well as bouts of hyperkalemia.  Admission creatinine 1.59.  Continue Lokelma 5 mg twice daily, follow-up chemistries  Creatinine stable at 1.40 on 9/6 13.  Hyperlipidemia.  Lipitor 14.  CAD with history of pacemaker.  Continue aspirin 15.  Chronic systolic congestive heart failure.  Latest ejection fraction 40 to 45%.  Monitor for any signs of fluid overload   Filed Weights   02/04/20 0348 02/06/20 0422 02/07/20 0316  Weight: 73.2 kg 73.3 kg 74.9 kg   ?Trending up on 9/6, no  weights for today 16.  Urinary retention.    Increased Flomax to 0.8 mg daily.  17. Orthostatic hypotension: abdominal binder and TEDs ordered  LOS: 4 days A FACE TO FACE EVALUATION WAS PERFORMED  Thorne Wirz Lorie Phenix 02/07/2020, 9:35 AM

## 2020-02-07 NOTE — Progress Notes (Signed)
Physical Therapy Session Note  Patient Details  Name: Jason Montes MRN: [id number] Date of Birth: 1951-02-09  Today's Date: 02/07/2020 PT Individual Time: 1100-1156 and 1300-1344  PT Individual Time Calculation (min): 56 min and 44 min  Short Term Goals: Week 1:  PT Short Term Goal 1 (Week 1): Pt will perform supine<>sit with min assist PT Short Term Goal 2 (Week 1): Pt will perform sit<>stands using LRAD with min assist PT Short Term Goal 3 (Week 1): Pt will perform bed<>chair transfers with mod assist PT Short Term Goal 4 (Week 1): Pt will ambulate at least 41ft using LRAD with mod assist  Skilled Therapeutic Interventions/Progress Updates:   Treatment Session 1: 1100-1156 56 min Received pt supine in bed with RN and NT performing hygiene, pt agreeable to therapy, and reported pain 5/10 in L heel. Repositioning, rest breaks, and distraction done to reduce pain levels. Pt reported "blurry vision" throughout session but denied any dizziness. Session with emphasis on functional mobility/transfers, generalized strengthening, dynamic standing balance/coordination, ambulation, and improved activity tolerance. Pt rolled L and R with supervision and required total A to don abdominal binder. Pt transferred supine<>sitting EOB with use of bedrails and min A.   Orthostatic Vitals: Supine: BP 124/69 HR 83bpm; asymptomatic Sitting EOB: 113/72 and HR 85bpm; asymptomatic Standing: BP 113/72 and HR 59 bpm; asymptomatic  Pt transferred sit<>stand with RW mod A and stand<>pivot bed<>WC with RW and min/mod A and transported to therapy gym in [facility] total A for time management purposes. Pt ambulated 4ft x 2 trials with RW and min A. Pt demonstrated decreased trunk rotation, narrow BOS, and decreased stride length but no LOB noted. Pt reported SOB and requested to sit and rest. O2 sat 96% and HR 90bpm. Stand<>pivot WC<>mat with RW and mod A. Pt transferred sit<>stand with RW mod A and worked on dynamic  standing balance performing alternating toe taps to 6in step 1x14 and 1x20 with min A for balance. Worked on dynamic standing balance tossing horshoes x 2 trials with LUE with 1UE support on RW and min A for balance. Stand<>pivot mat<>WC with RW min A and pt transported back to room in [facility] total A. Concluded session with pt sitting in WC, needs within reach, and seatbelt alarm on. Therapist provided fresh drink to pt.   Treatment Session 2: 9381-0175 44 min Received pt sitting in WC, pt agreeable to therapy, and denied any pain during session. Session with emphasis on functional mobility/transfers, generalized strengthening, dynamic standing balance/coordination, ambulation, and improved activity tolerance. L post-op shoe and abdominal binder donned during session. Pt performed WC mobility 13ft using bilateral UEs and supervision. Pt transported remainder of way in [facility] total A for energy conservation purposes. Pt transferred stand<>pivot with RW and min A x 2 trials and performed bilateral UE/LE strengthening on Nustep at workload 3 for 2 minutes and 30 seconds increasing to workload 5 for a total of 8 minutes for a total of 239 steps for improved cardiovascular endurance. Pt denied any dizziness during session and reported improved symptoms of blurry vision. Pt ambulated 62ft with RW and min A with min verbal cues for upright posture and turning technique. Pt transferred sit<>stand without AD and mod/max A x 2 trials. Pt with significant posterior lean and fear of falling, requiring max verbal cues for weight shifting anteriorly and upright posture. Pt transported back to room in [facility] total A and transferred WC<>bed stand<>pivot with RW and min A, doffed shoes total A, and transferred sit<>supine  with supervision. Concluded session with pt supine in bed, needs within reach, and bed alarm on.  Therapy Documentation Precautions:  Precautions Precautions: Fall, Other (comment) Precaution Comments: LLE WBAT heel  only with post-op shoe Required Braces or Orthoses: Other Brace Other Brace: postop shoe Restrictions Weight Bearing Restrictions: Yes RLE Weight Bearing: Weight bearing as tolerated LLE Weight Bearing: Weight bearing as tolerated LLE Partial Weight Bearing Percentage or Pounds: WBAT through heel with boot on   Therapy/Group: Individual Therapy [name] PT, DPT   02/07/2020, 7:28 AM

## 2020-02-07 NOTE — Progress Notes (Signed)
Occupational Therapy Session Note  Patient Details  Name: Jason Montes MRN: [id number] Date of Birth: 07-02-1950  Today's Date: 02/07/2020 OT Individual Time: 3568-6168 OT Individual Time Calculation (min): 60 min    Short Term Goals: Week 1:  OT Short Term Goal 1 (Week 1): Pt will perform toilet transfers with Min A OT Short Term Goal 2 (Week 1): Pt will transition sit to stand with proper body mechanics with Min A in prep for LB ADLs OT Short Term Goal 3 (Week 1): Pt will perform LB dress with AE PRN with Min A OT Short Term Goal 4 (Week 1): Pt will don post surgical shoe and R regular shoe with Min A  Skilled Therapeutic Interventions/Progress Updates:    Treatment session with focus on functional mobility, sit > stand, and dynamic standing balance during self-care tasks.  Pt received supine in bed agreeable to bathing and dressing during therapy session.  Pt completed bed mobility with increased time and mod assist due to posterior lean.  Therapist donned shoes and post -op shoe prior to transfer.  Pt completed sit > stand mod assist with facilitation for weight shift.  Pt reports no dizziness but fearfulness during transitional movements.  Completed stand pivot transfer min assist with RW with increased cues and encouragement.  Engaged in bathing and dressing at sit > stand level at sink.  Therapist providing mod assist for sit > stand and facilitation of weight shift during sit > stand and for stability during standing activities.  Therapist donned thigh high TEDS to RLE for BP, per orders.  Pt required max assistance with LB dressing due to decreased endurance and sequencing.  Pt remained seated upright in w/c with seat belt alarm on and all needs in reach.  BP 120/60 after standing ~2 mins.  Therapy Documentation Precautions:  Precautions Precautions: Fall, Other (comment) Precaution Comments: LLE WBAT heel only with post-op shoe Required Braces or Orthoses: Other Brace Other  Brace: postop shoe Restrictions Weight Bearing Restrictions: Yes RLE Weight Bearing: Weight bearing as tolerated LLE Weight Bearing: Weight bearing as tolerated LLE Partial Weight Bearing Percentage or Pounds: WBAT through heel with boot on Pain:  Pt with c/o pain in lower back and kidneys. Notified RN   Therapy/Group: Individual Therapy  [name] 02/07/2020, 9:49 AM

## 2020-02-07 NOTE — Progress Notes (Signed)
Physical Therapy Session Note  Patient Details  Name: Jason Montes MRN: [id number] Date of Birth: 1950/10/31  Today's Date: 02/07/2020 PT Individual Time: 1002-1030 PT Individual Time Calculation (min): 28 min   Short Term Goals: Week 1:  PT Short Term Goal 1 (Week 1): Pt will perform supine<>sit with min assist PT Short Term Goal 2 (Week 1): Pt will perform sit<>stands using LRAD with min assist PT Short Term Goal 3 (Week 1): Pt will perform bed<>chair transfers with mod assist PT Short Term Goal 4 (Week 1): Pt will ambulate at least 78ft using LRAD with mod assist  Skilled Therapeutic Interventions/Progress Updates:     Pt received seated in [facility] and agreeable to therapy. No complaint of pain. RN present to provide morning meds. Pt performs sit to stand from [facility] with modA at hips and verbal cues for sequencing and body mechanics. Pt stands with RW for 2:14. Reports some initial dizziness/light-headedness but improves with time. Pt reports feeling "funny" and like his eyes are "fuzzy". Blood pressure taken at 136/91. Pt stands additional rep with light modA and is able to maintain standing 3:00 with improved eccentric control when sitting back down. Pt left seated in WC with alarm intact and all needs within reach.  Therapy Documentation Precautions:  Precautions Precautions: Fall, Other (comment) Precaution Comments: LLE WBAT heel only with post-op shoe Required Braces or Orthoses: Other Brace Other Brace: postop shoe Restrictions Weight Bearing Restrictions: Yes RLE Weight Bearing: Weight bearing as tolerated LLE Weight Bearing: Weight bearing as tolerated LLE Partial Weight Bearing Percentage or Pounds: WBAT through heel with boot on   Therapy/Group: Individual Therapy  Breck Coons, PT, DPT 02/07/2020, 10:14 AM

## 2020-02-08 ENCOUNTER — Inpatient Hospital Stay (HOSPITAL_COMMUNITY): Payer: Medicare Other | Admitting: Occupational Therapy

## 2020-02-08 ENCOUNTER — Inpatient Hospital Stay (HOSPITAL_COMMUNITY): Payer: Medicare Other

## 2020-02-08 DIAGNOSIS — I951 Orthostatic hypotension: Secondary | ICD-10-CM

## 2020-02-08 LAB — GLUCOSE, CAPILLARY
Glucose-Capillary: 170 mg/dL — ABNORMAL HIGH (ref 70–99)
Glucose-Capillary: 227 mg/dL — ABNORMAL HIGH (ref 70–99)
Glucose-Capillary: 310 mg/dL — ABNORMAL HIGH (ref 70–99)
Glucose-Capillary: 226 mg/dL — ABNORMAL HIGH (ref 70–99)

## 2020-02-08 NOTE — Progress Notes (Signed)
Patient ID: Jason Montes, male   DOB: 09-06-1950, 69 y.o.   MRN: [id number]  Met with pt to discuss team conference goals supervision-CGA stairs and target discharge 9/26. Pt is glad to be making progress and feels this will be enough time to make the progress he needs to make. Have been contacted by [facility] who will take his referral at discharge-PT,OT,RN. Will work on discharge needs.

## 2020-02-08 NOTE — Patient Care Conference (Signed)
Inpatient RehabilitationTeam Conference and Plan of Care Update Date: 02/08/2020   Time: 11:34 AM    Patient Name: Jason Montes      Medical Record Number: [id number]  Date of Birth: 1950-07-28 Sex: Male         Room/Bed: 4M04C/4M04C-01 Payor Info: Payor: MEDICARE / Plan: MEDICARE PART A AND B / Product Type: *No Product type* /    Admit Date/Time:  02/03/2020  1:10 PM  Primary Diagnosis:  Acute metabolic encephalopathy  Hospital Problems: Principal Problem:   Acute metabolic encephalopathy Active Problems:   Post-operative pain   Benign essential HTN   Diabetic peripheral neuropathy ([place])   Labile blood glucose   Chronic systolic congestive heart failure [facility])   Urinary retention    Expected Discharge Date: Expected Discharge Date: 02/26/20  Team Members Present: Physician leading conference: Dr. Delice Lesch Care Coodinator Present: Dorien Chihuahua, RN, BSN, CRRN;Other (comment) Jacqlyn Larsen [place], SW) Nurse Present: Other (comment) Chaney Born, RN) PT Present: Becky Sax, PT OT Present: [name], OT SLP Present: Charolett Bumpers, SLP PPS Coordinator present : Ileana Ladd, Burna Mortimer, SLP     Current Status/Progress Goal Weekly Team Focus  Bowel/Bladder   Incontinent of bowel and bladder; LBM 02/05/2020  To become continent of bowel and bladder with minimal assistance  Assess bowel and bladder needs q shift and PRN and offer toileting q2 while awake   Swallow/Nutrition/ Hydration             ADL's   Mod assist sit > stand and stand pivot transfers, Min assist bathing at sink, Max assist LB dressing  Supervision  ADL retraining, sit > stand, dynamic standing balance, cognition/sequencing   Mobility   bed mobility mod A, transfers with RW mod A, WC mobility 21ft min A, unable to ambulate due to orthostatics.  supervision, CGA for stairs  functional mobility/transfers, upright activity tolerance, dynamic standing balance/coordination, ambulation, endurance.    Communication             Safety/Cognition/ Behavioral Observations  Min-Mod A recall, Min-supervision A problem solving  Min A recall, Supervision A problem solving/emergent awareness  recall strategies ( limited reading/writting ability), basic problem solving and error awareness   Pain   No complaints of pain  Patient rates pain < 2 out of 10  Q shift and PRN pain assessment   Skin   MASD, Stage 2 to buttocks, Left foot and left heel wound(purple,red)  Prevent skin from breakdown and infection  Q shift and PRN skin assessment     Discharge Planning:  Returning home to the boarding house with hired-friend checking on and PCS 1-2 hrs per day. Has all equipment from previous admits-not eligible to go to a SNF   Team Discussion: MD noted CKD/retention improved. Therapy notes currently heavy mod assist for transfers, Max assist for B+D. Near baseline for problem solving and has difficulty reading due to visual deficits. Patient with posterior lean and fear of falling with movement.  Patient on target to meet rehab goals: no, Supervision goals overall except CGA for steps. 6 step entry to home with left railing.  *See Care Plan and progress notes for long and short-term goals.   Revisions to Treatment Plan:  Try adaptive equipment for dressing.  Teaching Needs: Transfers, toileting, medications, skin and wound care, recall strategies  Current Barriers to Discharge: Inaccessible home environment, Decreased caregiver support and Insurance for SNF coverage  Possible Resolutions to Barriers: Adaptive equipment for self care LOS extended to  address issues      Medical Summary Current Status: Right-sided hemiparesis with altered mental status secondary to left basal ganglia corona radiata infarct  Barriers to Discharge: Behavior;Medical stability;Home enviroment access/layout   Possible Resolutions to [employer] Focus: Therapies, follow labs, optimize CBGs, optimize pain  meds   Continued Need for Acute Rehabilitation Level of Care: The patient requires daily medical management by a physician with specialized training in physical medicine and rehabilitation for the following reasons: Direction of a multidisciplinary physical rehabilitation program to maximize functional independence : Yes Medical management of patient stability for increased activity during participation in an intensive rehabilitation regime.: Yes Analysis of laboratory values and/or radiology reports with any subsequent need for medication adjustment and/or medical intervention. : Yes   I attest that I was present, lead the team conference, and concur with the assessment and plan of the team.   Dorien Chihuahua B 02/08/2020, 3:12 PM

## 2020-02-08 NOTE — Progress Notes (Addendum)
[place] PHYSICAL MEDICINE & REHABILITATION PROGRESS NOTE   Subjective/Complaints: Patient seen sitting up in bed this morning working with therapies.  Relatively good sitting balance noted.  Discussed orthostasis with therapies.  ROS: Denies CP, SOB, N/V/D.  Objective:   No results found. Recent Labs    02/06/20 0726  WBC 9.3  HGB 9.9*  HCT 31.9*  PLT PLATELET CLUMPS NOTED ON SMEAR, UNABLE TO ESTIMATE   Recent Labs    02/06/20 0726  NA 130*  K 4.4  CL 97*  CO2 24  GLUCOSE 126*  BUN 20  CREATININE 1.40*  CALCIUM 9.4    Intake/Output Summary (Last 24 hours) at 02/08/2020 0849 Last data filed at 02/08/2020 0717 Gross per 24 hour  Intake 838 ml  Output 350 ml  Net 488 ml     Physical Exam: Vital Signs Blood pressure 123/62, pulse 84, temperature 98 F (36.7 C), temperature source Oral, resp. rate 16, weight 58.3 kg, SpO2 93 %. Constitutional: No distress . Vital signs reviewed. HENT: Normocephalic.  Atraumatic. Eyes: EOMI. No discharge. Cardiovascular: No JVD. Respiratory: Normal effort.  No stridor. GI: Non-distended. Skin: Abrasions with dressing CDI Left great toe with sutures with serosanguineous drainage Psych: Normal mood.  Normal behavior. Musc: Left great toe amputation with edema and tenderness Neuro: Alert and oriented x1 Motor: 4+/5 throughout, except left foot Bilateral upper extremity resting tremors  Assessment/Plan: 1. Functional deficits secondary to acute metabolic encephalopathy/drug overdose and hx of multiple CVAs which require 3+ hours per day of interdisciplinary therapy in a comprehensive inpatient rehab setting.  Physiatrist is providing close team supervision and 24 hour management of active medical problems listed below.  Physiatrist and rehab team continue to assess barriers to discharge/monitor patient progress toward functional and medical goals  Care Tool:  Bathing    Body parts bathed by patient: Right arm, Left arm,  Chest, Abdomen, Front perineal area, Right upper leg, Left upper leg, Face   Body parts bathed by helper: Buttocks     Bathing assist Assist Level: Minimal Assistance - Patient > 75%     Upper Body Dressing/Undressing Upper body dressing   What is the patient wearing?: Pull over shirt    Upper body assist Assist Level: Supervision/Verbal cueing    Lower Body Dressing/Undressing Lower body dressing      What is the patient wearing?: Pants     Lower body assist Assist for lower body dressing: Maximal Assistance - Patient 25 - 49%     Toileting Toileting    Toileting assist Assist for toileting: Dependent - Patient 0%     Transfers Chair/bed transfer  Transfers assist     Chair/bed transfer assist level: Moderate Assistance - Patient 50 - 74%     Locomotion Ambulation   Ambulation assist   Ambulation activity did not occur: Safety/medical concerns  Assist level: Minimal Assistance - Patient > 75% Assistive device: Walker-rolling Max distance: 38ft   Walk 10 feet activity   Assist  Walk 10 feet activity did not occur: Safety/medical concerns  Assist level: Minimal Assistance - Patient > 75% Assistive device: Walker-rolling   Walk 50 feet activity   Assist Walk 50 feet with 2 turns activity did not occur: Safety/medical concerns         Walk 150 feet activity   Assist Walk 150 feet activity did not occur: Safety/medical concerns         Walk 10 feet on uneven surface  activity   Assist Walk 10 feet  on uneven surfaces activity did not occur: Safety/medical concerns         Wheelchair     Assist Will patient use wheelchair at discharge?: No (No PT long term goals ) Type of Wheelchair: Manual    Wheelchair assist level: Minimal Assistance - Patient > 75% Max wheelchair distance: 7ft    Wheelchair 50 feet with 2 turns activity    Assist        Assist Level: Minimal Assistance - Patient > 75%, Moderate Assistance -  Patient 50 - 74%   Wheelchair 150 feet activity     Assist      Assist Level: Maximal Assistance - Patient 25 - 49%   Blood pressure 123/62, pulse 84, temperature 98 F (36.7 C), temperature source Oral, resp. rate 16, weight 58.3 kg, SpO2 93 %.  Medical Problem List and Plan: 1.  Altered mental status secondary to acute metabolic encephalopathy related to possible drug overdose as well as history of multiple CVAs/vascular dementia  Continue CIR  Team conference today to discuss current and goals and coordination of care, home and environmental barriers, and discharge planning with nursing, case manager, and therapies. Please see conference note from today as well.  2.  Antithrombotics: -DVT/anticoagulation: Eliquis             -antiplatelet therapy: Aspirin 81 mg daily, Plavix 75 mg daily 3. Pain Management: Neurontin 200 mg 3 times daily, Cymbalta 20 mg daily.   Controlled on 9/8 4. Mood: Provide emotional support             -antipsychotic agents: Haldol 1 mg BID, Zyprexa 12.5 mg nightly 5. Neuropsych: This patient is not fully capable of making decisions on his own behalf. 6. Skin/Wound Care: Routine skin checks 7. Fluids/Electrolytes/Nutrition: Routine in and outs with follow-up chemistries 8.  Atrial fibrillation.  Amiodarone 400 mg daily.  Controlled on 9/8 9.  Chronic left hallux osteomyelitis.  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 Caroline More.  Weightbearing as tolerated through left heel only with postoperative shoe  Needs cues to remember WB precautions 10.  Hypertension.  Monitor with increased mobility.   Controlled on 9/8 11.  Diabetes mellitus with peripheral neuropathy.  Hemoglobin A1c 7.9.  NovoLog 6 units 3 times daily with meals, Lantus insulin 10 units daily on admission.  Diabetic teaching.   Increased Lantus to 14U on 9/7.   CBG (last 3)  Recent Labs    02/07/20 2109 02/07/20 2245 02/08/20 0603  GLUCAP 163* 88 170*   Labile on 9/8,  monitor for trend 12.  CKD stage III as well as bouts of hyperkalemia.  Admission creatinine 1.59.  Continue Lokelma 5 mg twice daily.  Creatinine 1.40 on 9/6 13.  Hyperlipidemia.  Lipitor 14.  CAD with history of pacemaker.  Continue aspirin 15.  Chronic systolic congestive heart failure.  Latest ejection fraction 40 to 45%.  Monitor for any signs of fluid overload   Filed Weights   02/06/20 0422 02/07/20 0316 02/08/20 0252  Weight: 73.3 kg 74.9 kg 58.3 kg   Unreliable on 9/8 16.  Urinary retention.    Increased Flomax to 0.8 mg daily.  17. Orthostatic hypotension: abdominal binder and TEDs ordered  Improving 18.  Thrombocytopenia  Platelets 130 on 9/3  Continue to monitor  LOS: 5 days A FACE TO FACE EVALUATION WAS PERFORMED  Jason Montes Lorie Phenix 02/08/2020, 8:49 AM

## 2020-02-08 NOTE — Progress Notes (Signed)
Speech Language Pathology Daily Session Note  Patient Details  Name: Jason Montes MRN: [id number] Date of Birth: 07/31/1950  Today's Date: 02/08/2020 SLP Individual Time: 1002-1100 SLP Individual Time Calculation (min): 58 min  Short Term Goals: Week 1: SLP Short Term Goal 1 (Week 1): Patient will recall basic, daily information with mod A verbal cues for use of memory compensatory strategies. SLP Short Term Goal 2 (Week 1): Patient will demonstrate functional problem solving for basic and familiar tasks with supervison verbal cues. SLP Short Term Goal 3 (Week 1): Patient will self-monitor and correct errors during functional tasks with supervision verbal cues.  Skilled Therapeutic Interventions:Skilled ST services focused on cognitive skills. Pt expressed memory deficits " my memory is broken" and baseline history of occasionally forgetting to take medication prior to admission. SLP educated pt to utilize alarm system with "Alexa" for medication BID, since pt uses "Alexa" for morning alarm system. Pt expressed gratitude and appeared excited about the idea. Pt demonstrated ability to recall events from am PT session, however only able to recall therapist name and type of therapy with mod A verbal cues. SLP educated pt in association strategy to aid in recall of therapist name, pt demonstrated recall with created association with a 5 minute delay, but unable to recall with association at the end of the session. Pt supports deficits in reading and witting from previous CVA, with ability to read very simple sentence with extra time and demonstrated poor hand witting ability, therefore memory notebook maybe not be appropriate at this time. SLP facilitated basic problem solving, short term recall and error awareness utilizing ALFA money management task, pt completed 10 out 10 with supervision A verbal cues for working memory only. Task was further impacted by vision deficits due to reading glasses not  being present in room. Pt demonstrated semi-complex verbal problem solving skills utilizing mental math questions from ALFA, demonstrated accuracy in 5 out 8 opportunities.Pt was left in room with call bell within reach and chair alarm set. SLP recommends to continue skilled services.     Pain Pain Assessment Pain Score: 0-No pain  Therapy/Group: Individual Therapy  Terron Merfeld  [facility] 02/08/2020, 12:33 PM

## 2020-02-08 NOTE — Progress Notes (Signed)
Physical Therapy Session Note  Patient Details  Name: NAT LOWENTHAL MRN: [id number] Date of Birth: 20-Sep-1950  Today's Date: 02/08/2020 PT Individual Time: 0800-0910 PT Individual Time Calculation (min): 70 min   Short Term Goals: Week 1:  PT Short Term Goal 1 (Week 1): Pt will perform supine<>sit with min assist PT Short Term Goal 2 (Week 1): Pt will perform sit<>stands using LRAD with min assist PT Short Term Goal 3 (Week 1): Pt will perform bed<>chair transfers with mod assist PT Short Term Goal 4 (Week 1): Pt will ambulate at least 47ft using LRAD with mod assist  Skilled Therapeutic Interventions/Progress Updates:   Received pt supine in bed, pt agreeable to therapy, and denied any pain during session. Session with emphasis on functional mobility/transfers, dressing, generalized strengthening, dynamic standing balance/coordination, stair navigation, and improved activity tolerance. Donned pants in supine with mod A and transferred supine<>sitting EOB with min A from flat bed using bedrails. L post-op shoe donned. Doffed dirty scrub top and donned clean one with min A. Pt returned to supine and required total A to don ted hose and non-skid sock on L LE. Supine<>sitting EOB x trial 2 with CGA and cues for logroll technique. BP sitting EOB 104/52 without abdominal binder; pt asymptomatic. Pt with posterior lean in sitting requiring min A fading to supervision for sitting balance. MD present for morning rounds and to inspect L LE. Noted incision to be draining and requested nursing to re-dress wound. While waiting for nursing pt transferred bed<>WC via lateral scoot with min/mod A, increased time, and verbal cues for scooting technique and hand placement to avoid weight bearing through L LE. Pt sat in [facility] and brushed teeth and washed face sitting at sink with supervision and min cues for visual scanning to locate objects. RN present to dress wound and pt transferred sit<>stand with RW mod A with  max cues for anterior weight shifting. BP in standing 107/92 and HR 108bpm pt requested to return to sitting and reported his entire R side felt "wobbly". Pt transported to therapy gym in [facility] total A and navigated 4 steps with 2 rails max A ascending and descending with a step to pattern. Pt required cues for stepping sequencing, turning technique, and anterior weight shifting. Pt transferred sit<>stand x 3 trials with mod A at countertop. Pt required cues for hand placement on WC armrests, anterior weight shifting, and upright posture. Pt transported back to room in [facility] total A. Concluded session with pt sittting in [facility], needs within reach, and seatbelt alatrm on.   Therapy Documentation Precautions:  Precautions Precautions: Fall, Other (comment) Precaution Comments: LLE WBAT heel only with post-op shoe Required Braces or Orthoses: Other Brace Other Brace: postop shoe Restrictions Weight Bearing Restrictions: Yes RLE Weight Bearing: Weight bearing as tolerated LLE Weight Bearing: Weight bearing as tolerated LLE Partial Weight Bearing Percentage or Pounds:  (with ortho boot)   Therapy/Group: Individual Therapy [name] PT, DPT   02/08/2020, 7:18 AM

## 2020-02-08 NOTE — Progress Notes (Signed)
Occupational Therapy Session Note  Patient Details  Name: Jason Montes MRN: [id number] Date of Birth: [birth year]  Today's Date: 02/08/2020 OT Individual Time: 1352-1447 OT Individual Time Calculation (min): 55 min    Short Term Goals: Week 1:  OT Short Term Goal 1 (Week 1): Pt will perform toilet transfers with Min A OT Short Term Goal 2 (Week 1): Pt will transition sit to stand with proper body mechanics with Min A in prep for LB ADLs OT Short Term Goal 3 (Week 1): Pt will perform LB dress with AE PRN with Min A OT Short Term Goal 4 (Week 1): Pt will don post surgical shoe and R regular shoe with Min A  Skilled Therapeutic Interventions/Progress Updates:    Treatment session with focus on sit <> stand, dynamic standing balance, and activity tolerance.  Pt received upright in w/c reporting not having a great day, when asked further pt unable to specify. Pt agreeable to engage in therapy session.  Pt unable to recall any details about previous therapy sessions, stating that he hadn't had any therapy today.  Provided information via therapy notes with pt then able to recall majority of details.  Engaged in sit > stand at high low table with focus on increased anterior weight shift.  Pt able to complete all sit <> stand with Min assist this session, demonstrating increased sequencing and weight shift.  Engaged in table top task (pipe tree) with focus on anterior weight shifting during reaching and building tasks while following pattern.  Pt able to tolerate standing 2 mins at a time before requiring seated rest break.  Pt continuing to report not having a good day.  BP assessed 118/58 and HR 87 after standing, no c/o dizziness.  Pt engaged in standing task x3 but then requesting to be "done".  Pt reporting "frustrated" that he is not doing as well as yesterday, therapist reiterated that pt only Min A this session, compared to Mod A overall yesterday.  Completed stand pivot transfer with RW with  min assist back to bed.  Pt returned to supine with CGA and left with all needs in reach.  Therapy Documentation Precautions:  Precautions Precautions: Fall, Other (comment) Precaution Comments: LLE WBAT heel only with post-op shoe Required Braces or Orthoses: Other Brace Other Brace: postop shoe Restrictions Weight Bearing Restrictions: Yes RLE Weight Bearing: Weight bearing as tolerated LLE Weight Bearing: Weight bearing as tolerated LLE Partial Weight Bearing Percentage or Pounds:  (with ortho boot) General: General OT Amount of Missed Time: 20 Minutes Vital Signs: Therapy Vitals Temp: 98.1 F (36.7 C) Pulse Rate: 82 Resp: 16 BP: (!) 116/53 Patient Position (if appropriate): Sitting Oxygen Therapy SpO2: 97 % O2 Device: Room Air Pain: Pain Assessment Pain Score: 0-No pain   Therapy/Group: Individual Therapy  [name] 02/08/2020, 4:11 PM

## 2020-02-09 ENCOUNTER — Inpatient Hospital Stay (HOSPITAL_COMMUNITY): Payer: Medicare Other

## 2020-02-09 ENCOUNTER — Inpatient Hospital Stay (HOSPITAL_COMMUNITY): Payer: Medicare Other | Admitting: Occupational Therapy

## 2020-02-09 DIAGNOSIS — D696 Thrombocytopenia, unspecified: Secondary | ICD-10-CM

## 2020-02-09 DIAGNOSIS — E871 Hypo-osmolality and hyponatremia: Secondary | ICD-10-CM

## 2020-02-09 LAB — GLUCOSE, CAPILLARY
Glucose-Capillary: 133 mg/dL — ABNORMAL HIGH (ref 70–99)
Glucose-Capillary: 249 mg/dL — ABNORMAL HIGH (ref 70–99)
Glucose-Capillary: 260 mg/dL — ABNORMAL HIGH (ref 70–99)
Glucose-Capillary: 117 mg/dL — ABNORMAL HIGH (ref 70–99)

## 2020-02-09 MED ORDER — INSULIN ASPART 100 UNIT/ML ~~LOC~~ SOLN
9.0000 [IU] | Freq: Three times a day (TID) | SUBCUTANEOUS | Status: DC
  Administered 2020-02-09 – 2020-02-14 (×13): 9 [IU] via SUBCUTANEOUS

## 2020-02-09 NOTE — Progress Notes (Signed)
[place] PHYSICAL MEDICINE & REHABILITATION PROGRESS NOTE   Subjective/Complaints: Patient seen sitting up in his chair working with therapy this morning.  He states he slept well overnight.  He notes burning pain to his heel when laying in bed.  ROS: Denies CP, SOB, N/V/D  Objective:   No results found. No results for input(s): WBC, HGB, HCT, PLT in the last 72 hours. No results for input(s): NA, K, CL, CO2, GLUCOSE, BUN, CREATININE, CALCIUM in the last 72 hours.  Intake/Output Summary (Last 24 hours) at 02/09/2020 1019 Last data filed at 02/09/2020 0833 Gross per 24 hour  Intake 342 ml  Output 1050 ml  Net -708 ml     Physical Exam: Vital Signs Blood pressure 114/60, pulse 87, temperature 98 F (36.7 C), temperature source Oral, resp. rate 18, weight 59.2 kg, SpO2 97 %. Constitutional: No distress . Vital signs reviewed. HENT: Normocephalic.  Atraumatic. Eyes: EOMI. No discharge. Cardiovascular: No JVD.  RRR. Respiratory: Normal effort.  No stridor.  Bilateral clear to auscultation. GI: Non-distended.  BS +. Skin: Abrasions with dressing CDI Left great toe with dressing CDI Psych: Normal mood.  Normal behavior. Musc: Left great toe amputation with edema and tenderness Neuro: Alert and oriented x1 Motor: 4+/5 throughout, except left foot Bilateral upper extremity resting tremors, stable  Assessment/Plan: 1. Functional deficits secondary to acute metabolic encephalopathy/drug overdose and hx of multiple CVAs which require 3+ hours per day of interdisciplinary therapy in a comprehensive inpatient rehab setting.  Physiatrist is providing close team supervision and 24 hour management of active medical problems listed below.  Physiatrist and rehab team continue to assess barriers to discharge/monitor patient progress toward functional and medical goals  Care Tool:  Bathing    Body parts bathed by patient: Right arm, Left arm, Chest, Abdomen, Front perineal area, Right  upper leg, Left upper leg, Face   Body parts bathed by helper: Buttocks     Bathing assist Assist Level: Minimal Assistance - Patient > 75%     Upper Body Dressing/Undressing Upper body dressing   What is the patient wearing?: Pull over shirt    Upper body assist Assist Level: Supervision/Verbal cueing    Lower Body Dressing/Undressing Lower body dressing      What is the patient wearing?: Pants     Lower body assist Assist for lower body dressing: Maximal Assistance - Patient 25 - 49%     Toileting Toileting    Toileting assist Assist for toileting: Dependent - Patient 0%     Transfers Chair/bed transfer  Transfers assist     Chair/bed transfer assist level: Moderate Assistance - Patient 50 - 74%     Locomotion Ambulation   Ambulation assist   Ambulation activity did not occur: Safety/medical concerns  Assist level: Minimal Assistance - Patient > 75% Assistive device: Walker-rolling Max distance: 70ft   Walk 10 feet activity   Assist  Walk 10 feet activity did not occur: Safety/medical concerns  Assist level: Minimal Assistance - Patient > 75% Assistive device: Walker-rolling   Walk 50 feet activity   Assist Walk 50 feet with 2 turns activity did not occur: Safety/medical concerns  Assist level: Minimal Assistance - Patient > 75% Assistive device: Walker-rolling    Walk 150 feet activity   Assist Walk 150 feet activity did not occur: Safety/medical concerns         Walk 10 feet on uneven surface  activity   Assist Walk 10 feet on uneven surfaces activity did not  occur: Safety/medical concerns         Wheelchair     Assist Will patient use wheelchair at discharge?: No (No PT long term goals ) Type of Wheelchair: Manual    Wheelchair assist level: Minimal Assistance - Patient > 75% Max wheelchair distance: 28ft    Wheelchair 50 feet with 2 turns activity    Assist        Assist Level: Minimal Assistance -  Patient > 75%, Moderate Assistance - Patient 50 - 74%   Wheelchair 150 feet activity     Assist      Assist Level: Maximal Assistance - Patient 25 - 49%   Blood pressure 114/60, pulse 87, temperature 98 F (36.7 C), temperature source Oral, resp. rate 18, weight 59.2 kg, SpO2 97 %.  Medical Problem List and Plan: 1.  Altered mental status secondary to acute metabolic encephalopathy related to possible drug overdose as well as history of multiple CVAs/vascular dementia  Continue CIR 2.  Antithrombotics: -DVT/anticoagulation: Eliquis             -antiplatelet therapy: Aspirin 81 mg daily, Plavix 75 mg daily 3. Pain Management: Neurontin 200 mg 3 times daily, Cymbalta 20 mg daily.   Controlled on 9/9 4. Mood: Provide emotional support             -antipsychotic agents: Haldol 1 mg BID, Zyprexa 12.5 mg nightly 5. Neuropsych: This patient is not fully capable of making decisions on his own behalf. 6. Skin/Wound Care: Routine skin checks  Prevalon boot ordered for left heel 7. Fluids/Electrolytes/Nutrition: Routine in and outs with follow-up chemistries 8.  Atrial fibrillation.  Amiodarone 400 mg daily.  Controlled on 9/8 9.  Chronic left hallux osteomyelitis.  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 Caroline More.  Weightbearing as tolerated through left heel only with postoperative shoe  Needs cues to remember WB precautions 10.  Hypertension.  Monitor with increased mobility.   Controlled on 9/9 11.  Diabetes mellitus with peripheral neuropathy.  Hemoglobin A1c 7.9.    NovoLog 6 units 3 times daily with meals, increased to 9 3 times daily on 9/9  Diabetic teaching.   Increased Lantus to 14U on 9/7.   CBG (last 3)  Recent Labs    02/08/20 1626 02/08/20 2058 02/09/20 0608  GLUCAP 310* 226* 133*   12.  CKD stage III as well as bouts of hyperkalemia.  Admission creatinine 1.59.  Continue Lokelma 5 mg twice daily.  Creatinine 1.40 on 9/6 13.  Hyperlipidemia.   Lipitor 14.  CAD with history of pacemaker.  Continue aspirin 15.  Chronic systolic congestive heart failure.  Latest ejection fraction 40 to 45%.  Monitor for any signs of fluid overload   Filed Weights   02/07/20 0316 02/08/20 0252 02/09/20 0508  Weight: 74.9 kg 58.3 kg 59.2 kg   Unreliable on 9/8 16.  Urinary retention.    Increased Flomax to 0.8 mg daily.  17. Orthostatic hypotension: abdominal binder and TEDs ordered  Improving 18.  Thrombocytopenia  Platelets 130 on 9/3, labs ordered for tomorrow  Continue to monitor 19.  Hyponatremia  Sodium 130 on 9/6, labs ordered for tomorrow  LOS: 6 days A FACE TO FACE EVALUATION WAS PERFORMED  Jason Montes Phenix 02/09/2020, 10:19 AM

## 2020-02-09 NOTE — Progress Notes (Signed)
Speech Language Pathology Daily Session Note  Patient Details  Name: Jason Montes MRN: [id number] Date of Birth: 10-20-1950  Today's Date: 02/09/2020 SLP Individual Time: 1005-1100 SLP Individual Time Calculation (min): 55 min  Short Term Goals: Week 1: SLP Short Term Goal 1 (Week 1): Patient will recall basic, daily information with mod A verbal cues for use of memory compensatory strategies. SLP Short Term Goal 2 (Week 1): Patient will demonstrate functional problem solving for basic and familiar tasks with supervison verbal cues. SLP Short Term Goal 3 (Week 1): Patient will self-monitor and correct errors during functional tasks with supervision verbal cues.  Skilled Therapeutic Interventions:Skilled ST services focused on cognitive skills. SLP facilitated strategies (association, visualization and written aids) for short term recall skills in word picture association task. Pt demonstrated ability to create 2 out 4 associations and immediate recall of association and required written aid for delayed recall within 1-2 minute range. Pt supports baseline cognitive skills including, short term recall and problem solving deficits. SLP facilitated reassessment of cognitive skills administering SLUMS, pt scored 18 out 30, a slight decrease from evaluation with a score of 21 out 30. Pt continues to demonstrate deficits primarily in short term recall that impact problem solving skills, however appear to be nearing baseline abilities. Pt was left in room with call bell within reach and chair alarm set. SLP recommends to continue skilled services.     Pain Pain Assessment Pain Score: 0-No pain  Therapy/Group: Individual Therapy  Beckem Tomberlin  [facility] 02/09/2020, 2:48 PM

## 2020-02-09 NOTE — Progress Notes (Signed)
Physical Therapy Weekly Progress Note  Patient Details  Name: Jason Montes MRN: [id number] Date of Birth: 1950-12-23  Beginning of progress report period: February 04, 2020 End of progress report period: February 09, 2020  Today's Date: 02/09/2020 PT Individual Time: 4098-1191 PT Individual Time Calculation (min): 54 min   Patient has met 3 of 4 short term goals. Pt demonstrates gradual progress towards long term goals. Pt currently requires CGA for bed mobility, min A/supervision for dynamic sitting balance, mod A for transfers with RW, min A to ambulate 104ft with RW, heavy max A to navigate 4 steps with 2 rails, and supervision/min A for WC mobility up to 50ft. Pt also demonstrates improved orthostatic symptoms and BP appears more stabilized. Pt continues to be limited by decreased endurance, generalized weakness, decreased balance/postural control, and cognitive deficits.   Patient continues to demonstrate the following deficits muscle weakness, decreased coordination and decreased motor planning, decreased awareness, decreased problem solving and decreased safety awareness and decreased standing balance, decreased postural control and decreased balance strategies and therefore will continue to benefit from skilled PT intervention to increase functional independence with mobility.  Patient progressing toward long term goals..  Continue plan of care.  PT Short Term Goals Week 1:  PT Short Term Goal 1 (Week 1): Pt will perform supine<>sit with min assist PT Short Term Goal 1 - Progress (Week 1): Met PT Short Term Goal 2 (Week 1): Pt will perform sit<>stands using LRAD with min assist PT Short Term Goal 2 - Progress (Week 1): Progressing toward goal PT Short Term Goal 3 (Week 1): Pt will perform bed<>chair transfers with mod assist PT Short Term Goal 3 - Progress (Week 1): Met PT Short Term Goal 4 (Week 1): Pt will ambulate at least 69ft using LRAD with mod assist PT Short Term Goal 4  - Progress (Week 1): Met Week 2:  PT Short Term Goal 1 (Week 2): pt will perform all bed mobility with CGA PT Short Term Goal 2 (Week 2): pt will transfer sit<>stand with LRAD and min A consistantly PT Short Term Goal 3 (Week 2): Pt will ambulate 66ft with LRAD CGA  Skilled Therapeutic Interventions/Progress Updates:  Ambulation/gait training;Cognitive remediation/compensation;Discharge planning;DME/adaptive equipment instruction;Functional mobility training;Pain management;Psychosocial support;Splinting/orthotics;Therapeutic Activities;UE/LE Strength taining/ROM;Visual/perceptual remediation/compensation;Balance/vestibular training;Community reintegration;Disease management/prevention;Functional electrical stimulation;Neuromuscular re-education;Patient/family education;Skin care/wound management;Stair training;Therapeutic Exercise;UE/LE Coordination activities;Wheelchair propulsion/positioning   Today's Interventions: Received pt supine in bed frustrated because he was not allowed to sit EOB to eat breakfast. Therapist explained safety precautions and hospital policy and pt verbalized understanding. Therapist assisted with doffing dirty brief and donning clean one with max A due to orange juice spilling on brief. Donned R ted hose in supine with total A and pt transferred supine<>sitting EOB with bedrails and CGA with cues for logroll technique. RN present to administer medication and therapist informed RN that pt could sit EOB to eat breakfast with close supervision due to occasional posterior LOB or could sit in [facility] to eat independently (NT also notified). Donned L post-op shoe total A and pt sat EOB and finished breakfast with supervision while working on dynamic sitting balance and trunk control. Pt denied any pain during session. While sitting EOB eating, pt with 2 LOB posteriorly and to the L but pt able to self-correct with cues for anterior weight shifting. While eating pt spontaneously began to lay  down stating "I just feel worn out". Pt required extensive lying rest break and reported urge to urinate and requested to use urinal.  In supine, pt able to void in urinal with set up assist. Pt then transferred supine<>sitting EOB x trial 2 with CGA. Donned R shoe max A and pants sitting EOB with mod A. Pt transferred sit<>stand from elevated bed with mod A and required max A to pull pants over hips. Doffed dirty scrub top mod A and donned clean pull over scrub top with min A. Pt transferred bed<>WC stand<>pivot with RW min A with cues for turning technique and to reach back prior to sitting. Pt brushed teeth and washed face sitting in WC at sink with supervision. MD present for morning rounds and pt transported to therapy gym in [facility] total A for time management purposes. Pt transferred sit<>stand with RW min A  with cues for upright posture and anterior weight shifting and ambulated 53ft with RW min A. Pt required 1 standing rest break due to reports of SOB. O2 sat 96% and HR 100bpm after ambulating. Pt transported back to room in [facility] total A. Concluded session with pt sitting in WC, needs within reach, and seatbelt alarm on.   Therapy Documentation Precautions:  Precautions Precautions: Fall, Other (comment) Precaution Comments: LLE WBAT heel only with post-op shoe Required Braces or Orthoses: Other Brace Other Brace: postop shoe Restrictions Weight Bearing Restrictions: Yes RLE Weight Bearing: Weight bearing as tolerated LLE Weight Bearing: Weight bearing as tolerated LLE Partial Weight Bearing Percentage or Pounds:  (with ortho boot)  Therapy/Group: Individual Therapy  [name] PT, DPT  02/09/2020, 7:25 AM

## 2020-02-09 NOTE — Progress Notes (Signed)
Physical Therapy Session Note  Patient Details  Name: Jason Montes MRN: [id number] Date of Birth: [birth year]  Today's Date: 02/09/2020 PT Individual Time: 1130-1156 PT Individual Time Calculation (min): 26 min   Short Term Goals: Week 1:  PT Short Term Goal 1 (Week 1): Pt will perform supine<>sit with min assist PT Short Term Goal 1 - Progress (Week 1): Met PT Short Term Goal 2 (Week 1): Pt will perform sit<>stands using LRAD with min assist PT Short Term Goal 2 - Progress (Week 1): Progressing toward goal PT Short Term Goal 3 (Week 1): Pt will perform bed<>chair transfers with mod assist PT Short Term Goal 3 - Progress (Week 1): Met PT Short Term Goal 4 (Week 1): Pt will ambulate at least 87f using LRAD with mod assist PT Short Term Goal 4 - Progress (Week 1): Met  Skilled Therapeutic Interventions/Progress Updates:  Patient received sitting up in wc, agreeable to PT. He denies pain at this time. Patient attempting to propel wc himself, but was only able to propel ~120fbefore requesting to stop. Patient ambulated 13845f FWW + MinA. Decreased stride length R noted d/t ortho boot L limiting ROM into toe-off. Patient able to complete seated volleyball with 4# dowel with verbal cues to engage R UE. Patient completing seated ball kicks with R LE for improved coordination and appropriate activation of R LE mm. Patient returning to room in wc, seatbelt alarm on, call light within reach.   Therapy Documentation Precautions:  Precautions Precautions: Fall, Other (comment) Precaution Comments: LLE WBAT heel only with post-op shoe Required Braces or Orthoses: Other Brace Other Brace: postop shoe Restrictions Weight Bearing Restrictions: Yes RLE Weight Bearing: Weight bearing as tolerated LLE Weight Bearing: Weight bearing as tolerated LLE Partial Weight Bearing Percentage or Pounds:  (with ortho boot)    Therapy/Group: Individual Therapy  JenKaroline CaldwellT,  DPT, CBIS 02/09/2020, 7:41 AM

## 2020-02-09 NOTE — Progress Notes (Signed)
Occupational Therapy Session Note  Patient Details  Name: ROMEY MATHIESON MRN: [id number] Date of Birth: 14-May-1951  Today's Date: 02/09/2020 OT Individual Time: 1300-1345 OT Individual Time Calculation (min): 45 min    Short Term Goals: Week 1:  OT Short Term Goal 1 (Week 1): Pt will perform toilet transfers with Min A OT Short Term Goal 2 (Week 1): Pt will transition sit to stand with proper body mechanics with Min A in prep for LB ADLs OT Short Term Goal 3 (Week 1): Pt will perform LB dress with AE PRN with Min A OT Short Term Goal 4 (Week 1): Pt will don post surgical shoe and R regular shoe with Min A  Skilled Therapeutic Interventions/Progress Updates:    Patient seated in w/c, states that he is tired from this morning but willing to participate, denies pain at this time.   Completed dynavision for reaction time, UB coordination, trunk control and right side awareness.        Attempted in stance but unable to tolerate - trial 1 (seated, 2 minutes, whole screen) > 8 sec due to poor awareness of right side of board, provided strategies for visual scanning and repeated -  trial 2 = 4.83 sec, trial 3 (left hand, lower half of screen) = 6.67 sec, trial 4 (whole screen, bilateral UEs) = 6.67 sec Completed UB ergometer 2 x 4 minutes.    SPT with RW w/c to bed CG/min A.  Sit to supine CS.  He remained in bed at close of session with bed alarm set and call bell in hand.    Therapy Documentation Precautions:  Precautions Precautions: Fall, Other (comment) Precaution Comments: LLE WBAT heel only with post-op shoe Required Braces or Orthoses: Other Brace Other Brace: postop shoe Restrictions Weight Bearing Restrictions: Yes RLE Weight Bearing: Weight bearing as tolerated LLE Weight Bearing: Weight bearing as tolerated LLE Partial Weight Bearing Percentage or Pounds:  (with ortho boot)   Therapy/Group: Individual Therapy  Carlos Levering 02/09/2020, 7:35 AM

## 2020-02-10 ENCOUNTER — Inpatient Hospital Stay (HOSPITAL_COMMUNITY): Payer: Medicare Other

## 2020-02-10 ENCOUNTER — Inpatient Hospital Stay (HOSPITAL_COMMUNITY): Payer: Medicare Other | Admitting: Occupational Therapy

## 2020-02-10 LAB — GLUCOSE, CAPILLARY
Glucose-Capillary: 144 mg/dL — ABNORMAL HIGH (ref 70–99)
Glucose-Capillary: 251 mg/dL — ABNORMAL HIGH (ref 70–99)
Glucose-Capillary: 191 mg/dL — ABNORMAL HIGH (ref 70–99)
Glucose-Capillary: 204 mg/dL — ABNORMAL HIGH (ref 70–99)

## 2020-02-10 LAB — CBC WITH DIFFERENTIAL/PLATELET
Abs Immature Granulocytes: 0.06 10*3/uL (ref 0.00–0.07)
Basophils Absolute: 0 10*3/uL (ref 0.0–0.1)
Basophils Relative: 0 %
Eosinophils Absolute: 0.1 10*3/uL (ref 0.0–0.5)
Eosinophils Relative: 1 %
HCT: 28.4 % — ABNORMAL LOW (ref 39.0–52.0)
Hemoglobin: 8.7 g/dL — ABNORMAL LOW (ref 13.0–17.0)
Immature Granulocytes: 0 %
Lymphocytes Relative: 7 %
Lymphs Abs: 1 10*3/uL (ref 0.7–4.0)
MCH: 27.1 pg (ref 26.0–34.0)
MCHC: 30.6 g/dL (ref 30.0–36.0)
MCV: 88.5 fL (ref 80.0–100.0)
Monocytes Absolute: 1.2 10*3/uL — ABNORMAL HIGH (ref 0.1–1.0)
Monocytes Relative: 8 %
Neutro Abs: 12.7 10*3/uL — ABNORMAL HIGH (ref 1.7–7.7)
Neutrophils Relative %: 84 %
Platelets: 64 10*3/uL — ABNORMAL LOW (ref 150–400)
RBC: 3.21 MIL/uL — ABNORMAL LOW (ref 4.22–5.81)
RDW: 16.5 % — ABNORMAL HIGH (ref 11.5–15.5)
WBC: 15.2 10*3/uL — ABNORMAL HIGH (ref 4.0–10.5)
nRBC: 0 % (ref 0.0–0.2)

## 2020-02-10 LAB — BASIC METABOLIC PANEL
Anion gap: 9 (ref 5–15)
BUN: 28 mg/dL — ABNORMAL HIGH (ref 8–23)
CO2: 22 mmol/L (ref 22–32)
Calcium: 8.5 mg/dL — ABNORMAL LOW (ref 8.9–10.3)
Chloride: 95 mmol/L — ABNORMAL LOW (ref 98–111)
Creatinine, Ser: 1.61 mg/dL — ABNORMAL HIGH (ref 0.61–1.24)
GFR calc Af Amer: 51 mL/min — ABNORMAL LOW (ref >60)
GFR calc non Af Amer: 44 mL/min — ABNORMAL LOW (ref >60)
Glucose, Bld: 240 mg/dL — ABNORMAL HIGH (ref 70–99)
Potassium: 4.5 mmol/L (ref 3.5–5.1)
Sodium: 126 mmol/L — ABNORMAL LOW (ref 135–145)

## 2020-02-10 MED ORDER — ZINC OXIDE 40 % EX OINT
TOPICAL_OINTMENT | CUTANEOUS | Status: DC | PRN
  Filled 2020-02-10: qty 57

## 2020-02-10 NOTE — Progress Notes (Addendum)
[facility] PHYSICAL MEDICINE & REHABILITATION PROGRESS NOTE   Subjective/Complaints: Patient seen sitting up in a chair working with therapy this morning.  He states he slept well overnight.  He denies complaints.  Discussed sacral wound with nursing.  ROS: Denies CP, SOB, N/V/D  Objective:   No results found. No results for input(s): WBC, HGB, HCT, PLT in the last 72 hours. No results for input(s): NA, K, CL, CO2, GLUCOSE, BUN, CREATININE, CALCIUM in the last 72 hours.  Intake/Output Summary (Last 24 hours) at 02/10/2020 0900 Last data filed at 02/10/2020 0700 Gross per 24 hour  Intake 1080 ml  Output 400 ml  Net 680 ml     Physical Exam: Vital Signs Blood pressure (!) 115/55, pulse 94, temperature 98.5 F (36.9 C), resp. rate 18, weight 57.1 kg, SpO2 94 %. Constitutional: No distress . Vital signs reviewed. HENT: Normocephalic.  Atraumatic. Eyes: EOMI. No discharge. Cardiovascular: No JVD.  RRR. Respiratory: Normal effort.  No stridor.  Bilateral clear to auscultation. GI: Non-distended.  BS +. Skin: Left great toe with dressing CDI Psych: Normal mood.  Normal behavior. Musc: Left great toe amputation with edema and tenderness Neuro: Alert Motor: 4+/5 throughout, except left foot, unchanged Bilateral upper extremity resting tremors, stable  Assessment/Plan: 1. Functional deficits secondary to acute metabolic encephalopathy/drug overdose and hx of multiple CVAs which require 3+ hours per day of interdisciplinary therapy in a comprehensive inpatient rehab setting.  Physiatrist is providing close team supervision and 24 hour management of active medical problems listed below.  Physiatrist and rehab team continue to assess barriers to discharge/monitor patient progress toward functional and medical goals  Care Tool:  Bathing    Body parts bathed by patient: Right arm, Left arm, Chest, Abdomen, Front perineal area, Right upper leg, Left upper leg, Face   Body parts  bathed by helper: Buttocks     Bathing assist Assist Level: Minimal Assistance - Patient > 75%     Upper Body Dressing/Undressing Upper body dressing   What is the patient wearing?: Pull over shirt    Upper body assist Assist Level: Supervision/Verbal cueing    Lower Body Dressing/Undressing Lower body dressing      What is the patient wearing?: Pants     Lower body assist Assist for lower body dressing: Maximal Assistance - Patient 25 - 49%     Toileting Toileting    Toileting assist Assist for toileting: Dependent - Patient 0%     Transfers Chair/bed transfer  Transfers assist     Chair/bed transfer assist level: Moderate Assistance - Patient 50 - 74%     Locomotion Ambulation   Ambulation assist   Ambulation activity did not occur: Safety/medical concerns  Assist level: Minimal Assistance - Patient > 75% Assistive device: Walker-rolling Max distance: 29ft   Walk 10 feet activity   Assist  Walk 10 feet activity did not occur: Safety/medical concerns  Assist level: Minimal Assistance - Patient > 75% Assistive device: Walker-rolling   Walk 50 feet activity   Assist Walk 50 feet with 2 turns activity did not occur: Safety/medical concerns  Assist level: Minimal Assistance - Patient > 75% Assistive device: Walker-rolling    Walk 150 feet activity   Assist Walk 150 feet activity did not occur: Safety/medical concerns         Walk 10 feet on uneven surface  activity   Assist Walk 10 feet on uneven surfaces activity did not occur: Safety/medical concerns  Wheelchair     Assist Will patient use wheelchair at discharge?: No (No PT long term goals ) Type of Wheelchair: Manual    Wheelchair assist level: Minimal Assistance - Patient > 75% Max wheelchair distance: 38ft    Wheelchair 50 feet with 2 turns activity    Assist        Assist Level: Minimal Assistance - Patient > 75%, Moderate Assistance - Patient 50  - 74%   Wheelchair 150 feet activity     Assist      Assist Level: Maximal Assistance - Patient 25 - 49%   Blood pressure (!) 115/55, pulse 94, temperature 98.5 F (36.9 C), resp. rate 18, weight 57.1 kg, SpO2 94 %.  Medical Problem List and Plan: 1.  Altered mental status secondary to acute metabolic encephalopathy related to possible drug overdose as well as history of multiple CVAs/vascular dementia  Continue CIR 2.  Antithrombotics: -DVT/anticoagulation: Eliquis             -antiplatelet therapy: Aspirin 81 mg daily, Plavix 75 mg daily 3. Pain Management: Neurontin 200 mg 3 times daily, Cymbalta 20 mg daily.   Controlled on 9/10 4. Mood: Provide emotional support             -antipsychotic agents: Haldol 1 mg BID, Zyprexa 12.5 mg nightly 5. Neuropsych: This patient is not fully capable of making decisions on his own behalf. 6. Skin/Wound Care: Routine skin checks  Prevalon boot ordered for left heel 7. Fluids/Electrolytes/Nutrition: Routine in and outs with follow-up chemistries 8.  Atrial fibrillation.  Amiodarone 400 mg daily.  Controlled on 9/10 9.  Chronic left hallux osteomyelitis.  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 Caroline More.  Weightbearing as tolerated through left heel only with postoperative shoe  Needs cues to remember WB precautions 10.  Hypertension.  Monitor with increased mobility.   Controlled on 9/10 11.  Diabetes mellitus with peripheral neuropathy.  Hemoglobin A1c 7.9.    NovoLog 6 units 3 times daily with meals, increased to 9 3 times daily on 9/9  Diabetic teaching.   Increased Lantus to 14U on 9/7.   CBG (last 3)  Recent Labs    02/09/20 1636 02/09/20 2105 02/10/20 0614  GLUCAP 260* 117* 144*   Labile, however some improvement on 9/10 12.  CKD stage III as well as bouts of hyperkalemia.  Admission creatinine 1.59.  Continue Lokelma 5 mg twice daily.  Creatinine 1.40 on 9/6 13.  Hyperlipidemia.  Lipitor 14.  CAD with  history of pacemaker.  Continue aspirin 15.  Chronic systolic congestive heart failure.  Latest ejection fraction 40 to 45%.  Monitor for any signs of fluid overload   Filed Weights   02/08/20 0252 02/09/20 0508 02/10/20 0352  Weight: 58.3 kg 59.2 kg 57.1 kg   Relatively stable on 9/10 16.  Urinary retention.    Increased Flomax to 0.8 mg daily.  17. Orthostatic hypotension: abdominal binder and TEDs ordered  Improving 18.  Thrombocytopenia  Platelets 130 on 9/3, labs pending  Continue to monitor 19.  Hyponatremia  Sodium 130 on 9/6, labs pending  LOS: 7 days A FACE TO FACE EVALUATION WAS PERFORMED  Isabelly Kobler Lorie Phenix 02/10/2020, 9:00 AM

## 2020-02-10 NOTE — Progress Notes (Signed)
Physical Therapy Session Note  Patient Details  Name: Jason Montes MRN: [id number] Date of Birth: 02/24/1951  Today's Date: 02/10/2020 PT Individual Time: 1402-1443 PT Individual Time Calculation (min): 41 min  and Today's Date: 02/10/2020 PT Missed Time: 19 Minutes Missed Time Reason: Patient fatigue  Short Term Goals: Week 1:  PT Short Term Goal 1 (Week 1): Pt will perform supine<>sit with min assist PT Short Term Goal 1 - Progress (Week 1): Met PT Short Term Goal 2 (Week 1): Pt will perform sit<>stands using LRAD with min assist PT Short Term Goal 2 - Progress (Week 1): Progressing toward goal PT Short Term Goal 3 (Week 1): Pt will perform bed<>chair transfers with mod assist PT Short Term Goal 3 - Progress (Week 1): Met PT Short Term Goal 4 (Week 1): Pt will ambulate at least 10ft using LRAD with mod assist PT Short Term Goal 4 - Progress (Week 1): Met Week 2:  PT Short Term Goal 1 (Week 2): pt will perform all bed mobility with CGA PT Short Term Goal 2 (Week 2): pt will transfer sit<>stand with LRAD and min A consistantly PT Short Term Goal 3 (Week 2): Pt will ambulate 25ft with LRAD CGA  Skilled Therapeutic Interventions/Progress Updates:   Received pt supine in bed asleep, pt required max verbal and tactile stimuli and increased time to arouse. Pt with increased lethargy throughout session stating "I feel broken down and really tired." Pt reported sacral pain but did not state pain level. Pt agreeable to therapy and L post-op shoe donned during session. Session with emphasis on functional mobility/transfers, generalized strengthening, dynamic standing balance/coordination, and improved activity tolerance. Pt rolled to R with mod A and cues to grab onto bedrails. Pt stated "I don't think I can do this" referring to sitting up EOB. Pt required max A to transfer supine<>sitting EOB for trunk control and LE management. Donned R shoe total A and pt transferred stand<>pivot bed<>WC  with RW and heavy mod A. Laboratory present to draw blood. Pt with increased confusion stating "I think we left out of here in the car this morning and someone brought me back here." When asked where he was pt stated [facility] Pt also unable to recall that he has been to the gym in therapy. Pt required min cues for redirection but reported feeling slightly more awake sitting in WC. BP sitting in WC 105/51 and HR 81bpm; pt asymptomatic. Pt transported to dayroom in WC total A for energy conservation purposes and engaged in bilateral LE strengthening on Kinetron at 30 cm/sec for 1 minute x 2 trials. Pt falling asleep while exercising and required mod cues to remain awake. Pt reported feeling like he didn't have enough energy to get back in bed and requested to return to room. Transported back to room in WC total A and transferred WC<>bed stand<>pivot with RW heavy mod A as pt with strong posterior lean this afternoon. Doffed R shoe total A and pt transferred sit<>supine with min A for LE management. Concluded session with pt in R sidelying, needs within reach, and pillow placed under sacrum for pressure relief. Pt asleep before therapist left room. 19 minutes missed of skilled physical therapy due to fatigue.   Therapy Documentation Precautions:  Precautions Precautions: Fall, Other (comment) Precaution Comments: LLE WBAT heel only with post-op shoe Required Braces or Orthoses: Other Brace Other Brace: postop shoe Restrictions Weight Bearing Restrictions: Yes RLE Weight Bearing: Weight bearing as   tolerated LLE Weight Bearing: Weight bearing as tolerated (heel WB with postop shoe) LLE Partial Weight Bearing Percentage or Pounds:  (with ortho boot)  Therapy/Group: Individual Therapy [name] PT, DPT   02/10/2020, 7:31 AM

## 2020-02-10 NOTE — Progress Notes (Signed)
Speech Language Pathology Weekly Progress and Session Note  Patient Details  Name: Jason Montes MRN: [id number] Date of Birth: 1950/06/10  Beginning of progress report period: February 04, 2020 End of progress report period: February 10, 2020  Today's Date: 02/10/2020 SLP Individual Time: 0900-1000 SLP Individual Time Calculation (min): 60 min  Short Term Goals: Week 1: SLP Short Term Goal 1 (Week 1): Patient will recall basic, daily information with mod A verbal cues for use of memory compensatory strategies. SLP Short Term Goal 1 - Progress (Week 1): Met SLP Short Term Goal 2 (Week 1): Patient will demonstrate functional problem solving for basic and familiar tasks with supervison verbal cues. SLP Short Term Goal 2 - Progress (Week 1): Not met SLP Short Term Goal 3 (Week 1): Patient will self-monitor and correct errors during functional tasks with supervision verbal cues. SLP Short Term Goal 3 - Progress (Week 1): Not met    New Short Term Goals: Week 2: SLP Short Term Goal 1 (Week 2): Patient will recall basic, daily information with min A verbal cues for use of memory compensatory strategies and extrenal aids. SLP Short Term Goal 2 (Week 2): Patient will demonstrate functional problem solving for basic and familiar tasks with supervison verbal cues. SLP Short Term Goal 3 (Week 2): Patient will self-monitor and correct errors during functional tasks with supervision verbal cues.  Weekly Progress Updates: Pt met 2 out 3 goals this reporting period due to inconsistent demonstration of skills. SLP attempted to instruct pt in internal memory compensatory strategies, however likely due to baseline deficits there was little carryover. SLP initiated external compensatory strategies including use of memory notebook to aid in daily recall. Pt demonstrated a variety of basic to semi-complex problem solving and error awareness skills at min-supervision A level.  SLP re-adminstered cognitive  assessment SLUMS due to pt supporting nearing cognitive baseline, however pt demonstrated a slight decrease in score from 21 out 30 on evaluation to 18 out 30 (n=>27.) Pt would benefit from a short period of continued ST services to focus on carryover of external aid for recall and further practice in basic problem solving/emegernt awareness. Pt would continue to benefit from skilled St services to maximize functional independence and reduce burden of care requiring, supervision A and no follow up ST services at discharge.     Intensity: Minumum of 1-2 x/day, 30 to 90 minutes Frequency: 3 to 5 out of 7 days Duration/Length of Stay: 9/25 Treatment/Interventions: Cognitive remediation/compensation;Internal/external aids;Therapeutic Activities;Cueing hierarchy;Environmental controls;Functional tasks;Patient/family education   Daily Session  Skilled Therapeutic Interventions: Skilled ST services focused on cognitive skills. SLP communciated with OT/PT piror to treatment, both supporting needed for continued ST services in carryover for recall and inconsistent problem solving. SLP initiated memory notebook with pt. Pt was initially able to recall 1 out 5 am events with OT, following use of memory notebook pt recalled 3 out 5 items in 2-4 minute delays and required supervision A verbal cues to reference memory notebook. SLP recorded today's ST event and pt required mod A fade to min A verbal cues for reading accuracy. SLP and NT assisted pt to bed per request, pt required min A. Pt was left with call bell within reach and bed alarm set. Recommend to continue skilled ST services/     General    Pain Pain Assessment Pain Score: 0-No pain  Therapy/Group: Individual Therapy  Jason Montes  [facility] 02/10/2020, 12:21 PM

## 2020-02-10 NOTE — Progress Notes (Signed)
Occupational Therapy Session Note  Patient Details  Name: Jason Montes MRN: [id number] Date of Birth: 1950/12/22  Today's Date: 02/10/2020 OT Individual Time: 5284-1324 OT Individual Time Calculation (min): 68 min    Short Term Goals: Week 1:  OT Short Term Goal 1 (Week 1): Pt will perform toilet transfers with Min A OT Short Term Goal 2 (Week 1): Pt will transition sit to stand with proper body mechanics with Min A in prep for LB ADLs OT Short Term Goal 3 (Week 1): Pt will perform LB dress with AE PRN with Min A OT Short Term Goal 4 (Week 1): Pt will don post surgical shoe and R regular shoe with Min A  Skilled Therapeutic Interventions/Progress Updates:    Treatment session with focus on functional mobility, sit > stand, dynamic standing balance, and overall activity tolerance.  Pt received supine in bed agreeable to therapy session.  Pt required mod assist to come to sitting at EOB due to decreased sequencing and fatigue.  Pt completed sit > stand with RW with min-mod assist with increased time to complete full upright standing.  Pt ambulated short distance to sink with RW with min assist.  Engaged in UB bathing and dressing at sink with setup and cues and increased time when donning shirt.  Completed sit > stand from w/c with mod assist, pt able to maintain standing 1-2 mins to wash buttocks.  Pt reports needing to sink before rinsing due to BLE weakness.  After rest break, attempted to stand again with pt requiring max assist to come to partial standing, when attempting to weight shift forward pt with knee buckling requiring total assist to prevent fall.  Therapist donned pants and incontinence brief while pt seated, required +2 to assist with coming up to partial stand with 2nd person assisting with hygiene and clothing management while therapist assisted pt to maintain partial stand with max assist.  Pt remained upright in w/c as he has upcoming therapy session.  Discussed use of Stedy  vs MaxiMove for safe transfer back to bed depending on BLE strength.    Therapy Documentation Precautions:  Precautions Precautions: Fall, Other (comment) Precaution Comments: LLE WBAT heel only with post-op shoe Required Braces or Orthoses: Other Brace Other Brace: postop shoe Restrictions Weight Bearing Restrictions: Yes RLE Weight Bearing: Weight bearing as tolerated LLE Weight Bearing: Weight bearing as tolerated (heel WB with postop shoe) LLE Partial Weight Bearing Percentage or Pounds:  (with ortho boot) Pain:  Pt with no c/o pain   Therapy/Group: Individual Therapy  [name] 02/10/2020, 10:08 AM

## 2020-02-11 ENCOUNTER — Inpatient Hospital Stay (HOSPITAL_COMMUNITY): Payer: Medicare Other

## 2020-02-11 ENCOUNTER — Inpatient Hospital Stay (HOSPITAL_COMMUNITY): Payer: Medicare Other | Admitting: Speech Pathology

## 2020-02-11 ENCOUNTER — Inpatient Hospital Stay (HOSPITAL_COMMUNITY): Payer: Medicare Other | Admitting: Physical Therapy

## 2020-02-11 ENCOUNTER — Inpatient Hospital Stay (HOSPITAL_COMMUNITY): Payer: Medicare Other | Admitting: Occupational Therapy

## 2020-02-11 DIAGNOSIS — D62 Acute posthemorrhagic anemia: Secondary | ICD-10-CM

## 2020-02-11 DIAGNOSIS — N390 Urinary tract infection, site not specified: Secondary | ICD-10-CM

## 2020-02-11 LAB — CBC
HCT: 30.8 % — ABNORMAL LOW (ref 39.0–52.0)
Hemoglobin: 9.2 g/dL — ABNORMAL LOW (ref 13.0–17.0)
MCH: 26.7 pg (ref 26.0–34.0)
MCHC: 29.9 g/dL — ABNORMAL LOW (ref 30.0–36.0)
MCV: 89.5 fL (ref 80.0–100.0)
Platelets: UNDETERMINED 10*3/uL (ref 150–400)
RBC: 3.44 MIL/uL — ABNORMAL LOW (ref 4.22–5.81)
RDW: 16.2 % — ABNORMAL HIGH (ref 11.5–15.5)
WBC: 10.5 10*3/uL (ref 4.0–10.5)
nRBC: 0 % (ref 0.0–0.2)

## 2020-02-11 LAB — URINALYSIS, ROUTINE W REFLEX MICROSCOPIC
Bilirubin Urine: NEGATIVE
Glucose, UA: 50 mg/dL — AB
Hgb urine dipstick: NEGATIVE
Ketones, ur: NEGATIVE mg/dL
Nitrite: NEGATIVE
Protein, ur: >=300 mg/dL — AB
Specific Gravity, Urine: 1.02 (ref 1.005–1.030)
WBC, UA: >50 WBC/hpf — ABNORMAL HIGH (ref 0–5)
pH: 8 (ref 5.0–8.0)

## 2020-02-11 LAB — GLUCOSE, CAPILLARY
Glucose-Capillary: 128 mg/dL — ABNORMAL HIGH (ref 70–99)
Glucose-Capillary: 212 mg/dL — ABNORMAL HIGH (ref 70–99)
Glucose-Capillary: 174 mg/dL — ABNORMAL HIGH (ref 70–99)
Glucose-Capillary: 333 mg/dL — ABNORMAL HIGH (ref 70–99)

## 2020-02-11 MED ORDER — CEPHALEXIN 250 MG PO CAPS
500.0000 mg | ORAL_CAPSULE | Freq: Four times a day (QID) | ORAL | Status: DC
  Administered 2020-02-11 – 2020-02-12 (×5): 500 mg via ORAL
  Filled 2020-02-11 (×5): qty 2

## 2020-02-11 NOTE — Progress Notes (Signed)
Physical Therapy Session Note  Patient Details  Name: Jason Montes MRN: [id number] Date of Birth: 1950/10/13  Today's Date: 02/11/2020 PT Individual Time: 4619-0122 PT Individual Time Calculation (min): 68 min   Short Term Goals: Week 2:  PT Short Term Goal 1 (Week 2): pt will perform all bed mobility with CGA PT Short Term Goal 2 (Week 2): pt will transfer sit<>stand with LRAD and min A consistantly PT Short Term Goal 3 (Week 2): Pt will ambulate 109ft with LRAD CGA  Skilled Therapeutic Interventions/Progress Updates:   Pt received sitting in WC and agreeable to PT. Pt performed WC mobility x 162ft with min assist to prevent veer to the R.   Pt performed sit<>stand from PT WC with min assist x 5 throughout session and increased time as well as cues for LE placement and anterior weight shift. PT assessed Orthostatics. Sitting: 123/61, HR 78. Standing: 108/63, HR 81.  Gait training with RW x 56ft +43ft with min assist for safety and cues for AD management, no s/s of orthostatic hypotension noted by pt.     Dynamic balance training to perform lateral reach to the R and L to obtain bean bag and then toss to target. Min cues for improved weight shifting R>L as well as awareness of posterior LOB intermittently.   Pt required multiple prolonged rest breaks throughout session due to fatigue and was noted to fall asleep between bouts of balance training.  Pt returned to room and performed ambulatory transfer to bed with min assist and RW. Sit>supine completed with supervision assist and increased time due to mild pain in the sarcum, which dissipated after ~30sec in supine. Pt left supine in bed with call bell in reach and all needs met.       Therapy Documentation Precautions:  Precautions Precautions: Fall, Other (comment) Precaution Comments: LLE WBAT heel only with post-op shoe Required Braces or Orthoses: Other Brace Other Brace: postop shoe Restrictions Weight Bearing  Restrictions: No RLE Weight Bearing: Weight bearing as tolerated LLE Weight Bearing: Weight bearing as tolerated (heel WB with postop shoe) LLE Partial Weight Bearing Percentage or Pounds:  (with ortho boot)   Pain: Pain Assessment Pain Scale: 0-10 Pain Score: 0-No pain at rest   Therapy/Group: Individual Therapy  Lorie Phenix 02/11/2020, 5:06 PM

## 2020-02-11 NOTE — Progress Notes (Signed)
Patient c/o burning when he urinates; noted odor and sediments on his urine. MD notified new orders noted.

## 2020-02-11 NOTE — Progress Notes (Signed)
Physical Therapy Session Note  Patient Details  Name: Jason Montes MRN: [id number] Date of Birth: January 07, 1951  Today's Date: 02/11/2020 PT Individual Time: 0800-0830 PT Individual Time Calculation (min): 30 min   Short Term Goals: Week 1:  PT Short Term Goal 1 (Week 1): Pt will perform supine<>sit with min assist PT Short Term Goal 1 - Progress (Week 1): Met PT Short Term Goal 2 (Week 1): Pt will perform sit<>stands using LRAD with min assist PT Short Term Goal 2 - Progress (Week 1): Progressing toward goal PT Short Term Goal 3 (Week 1): Pt will perform bed<>chair transfers with mod assist PT Short Term Goal 3 - Progress (Week 1): Met PT Short Term Goal 4 (Week 1): Pt will ambulate at least 44ft using LRAD with mod assist PT Short Term Goal 4 - Progress (Week 1): Met Week 2:  PT Short Term Goal 1 (Week 2): pt will perform all bed mobility with CGA PT Short Term Goal 2 (Week 2): pt will transfer sit<>stand with LRAD and min A consistantly PT Short Term Goal 3 (Week 2): Pt will ambulate 30ft with LRAD CGA  Skilled Therapeutic Interventions/Progress Updates:    pt received in bed and agreeable to therapy, with nursing present. Pt directed in supine>sit EOB with HOB slightly elevated (+) use of bed rails mod A x1 with extra time to gain initial sitting balance at bedside with pt demonstrating mild sway however denied dizziness or lightheadedness. PT donned post-op shoe total A on LLE and shoe pt requested on RLE total A in sitting. Pt directed in STS to [place] however pt unable to come to standing on first attempt and required max A on second attempt to achieve full upright standing and gain initial balance. Pt reported slight dizziness in standing and instructed in deep breathing and pt reported relief of symptoms after <30s. Pt directed in transfer to [facility] however pt requested to to return to sitting EOB 2/2 to fear of falling and reported "I don't think I get there" despite WC being directly  beside pt, pt directed in returning to sitting EOB, directed in relaxation breathing to calm pt, which relieved him and reported he felt better. Pt directed in STS mod A with elevated bed this attempt, extra time with single step cues to transfer stand pivot to WC. Pt directed in WC mobility to sink, min A 5' and VC for break use then STS to FWW at max A to sink, pt demonstrated poor safety awareness and reaching for sink, counter, and not holding onto walker, mod A for balance and safety with nursing present throughout session provided assistance with safety as well. Pt directed to return to sitting in [facility] and reported he felt like he was going to fall, pt educated that with safe setup of WC, FWW, 2 helpers he can safely stand. Pt agreed to attempt again, mod A however unable to remain standing for functional tasks. Pt setup for tooth brushing in WC and completed with setup. Pt left in WC, alarm bet set, All needs in reach and in good condition. Call light in hand.    Therapy Documentation Precautions:  Precautions Precautions: Fall, Other (comment) Precaution Comments: LLE WBAT heel only with post-op shoe Required Braces or Orthoses: Other Brace Other Brace: postop shoe Restrictions Weight Bearing Restrictions: No RLE Weight Bearing: Weight bearing as tolerated LLE Weight Bearing: Weight bearing as tolerated (heel WB with postop shoe) LLE Partial Weight Bearing Percentage or Pounds:  (with ortho boot)  Therapy/Group: Individual Therapy  Junie Panning 02/11/2020, 9:28 AM

## 2020-02-11 NOTE — Progress Notes (Signed)
Occupational Therapy Session Note  Patient Details  Name: Jason Montes MRN: [id number] Date of Birth: Jan 05, 1951  Today's Date: 02/11/2020 OT Individual Time: 5053-9767 OT Individual Time Calculation (min): 74 min    Short Term Goals: Week 2:  OT Short Term Goal 1 (Week 2): Pt will perform toilet transfers with Min A OT Short Term Goal 2 (Week 2): Pt will perform LB dress with AE PRN with Min A OT Short Term Goal 3 (Week 2): Pt will don post surgical shoe and R regular shoe with Min A OT Short Term Goal 4 (Week 2): Pt will maintain standing with CGA for 2 mins to complete functional task  Skilled Therapeutic Interventions/Progress Updates:    Pt greeted in the w/c with no c/o pain, wearing Lt post op shoe and requesting to shower. Min A for stand pivot>TTB using the grab bar. OT then wrapped and elevated his Lt foot. Pt bathed while seated with min cues for sequencing, assistance for washing the Rt foot and also buttocks thoroughly. Min A for stand pivot<w/c after shower where pt then proceeded with dressing tasks sit<stand. He needed Max A for LB dressing overall, Min A for sit<stands using RW, pt with posterior bias and tendency to slightly tip transfer surfaces backwards. Setup for donning overhead shirt. At end of session pt remained sitting up in the w/c and was left with all needs within reach and safety belt fastened. Tx focus placed on functional transfers, dynamic balance, and ADL retraining.   Therapy Documentation Precautions:  Precautions Precautions: Fall, Other (comment) Precaution Comments: LLE WBAT heel only with post-op shoe Required Braces or Orthoses: Other Brace Other Brace: postop shoe Restrictions Weight Bearing Restrictions: No RLE Weight Bearing: Weight bearing as tolerated LLE Weight Bearing: Weight bearing as tolerated (heel WB with postop shoe) LLE Partial Weight Bearing Percentage or Pounds:  (with ortho boot) ADL: ADL Grooming:  Supervision/safety Where Assessed-Grooming: Sitting at sink Upper Body Bathing: Supervision/safety Where Assessed-Upper Body Bathing: Sitting at sink Lower Body Bathing: Moderate assistance Where Assessed-Lower Body Bathing: Sitting at sink, Standing at sink Upper Body Dressing: Supervision/safety Where Assessed-Upper Body Dressing: Sitting at sink Lower Body Dressing: Maximal assistance Where Assessed-Lower Body Dressing: Sitting at sink, Standing at sink      Therapy/Group: Individual Therapy  Marquin Patino A Kaprice Kage 02/11/2020, 4:07 PM

## 2020-02-11 NOTE — Progress Notes (Addendum)
Jason Montes PHYSICAL MEDICINE & REHABILITATION PROGRESS NOTE   Subjective/Complaints: Patient seen laying in bed this morning. He states he slept well overnight. He notes improvement in heel pain. Later notified by nursing regarding burning during urination. He states he has not received his Prevalon boot.  ROS: Denies CP, SOB, N/V/D  Objective:   No results found. Recent Labs    02/10/20 1424  WBC 15.2*  HGB 8.7*  HCT 28.4*  PLT 64*   Recent Labs    02/10/20 1135  NA 126*  K 4.5  CL 95*  CO2 22  GLUCOSE 240*  BUN 28*  CREATININE 1.61*  CALCIUM 8.5*    Intake/Output Summary (Last 24 hours) at 02/11/2020 1326 Last data filed at 02/11/2020 1200 Gross per 24 hour  Intake 460 ml  Output 750 ml  Net -290 ml     Physical Exam: Vital Signs Blood pressure (!) 107/54, pulse 73, temperature 97.9 F (36.6 C), resp. rate 16, weight 60.3 kg, SpO2 97 %. Constitutional: No distress . Vital signs reviewed. HENT: Normocephalic.  Atraumatic. Eyes: EOMI. No discharge. Cardiovascular: No JVD.  RRR. Respiratory: Normal effort.  No stridor.  Bilateral clear to auscultation. GI: Non-distended.  BS +. Skin: Left great toe with dressing CDI Sacral ulcer appears to be moisture associated and healing Psych: Normal mood.  Normal behavior. Musc: Left great toe with edema and tenderness Neuro: Alert Motor: 4+/5 throughout, except left foot, stable Bilateral upper extremity resting tremors, stable  Assessment/Plan: 1. Functional deficits secondary to acute metabolic encephalopathy/drug overdose and hx of multiple CVAs which require 3+ hours per day of interdisciplinary therapy in a comprehensive inpatient rehab setting.  Physiatrist is providing close team supervision and 24 hour management of active medical problems listed below.  Physiatrist and rehab team continue to assess barriers to discharge/monitor patient progress toward functional and medical goals  Care Tool:  Bathing     Body parts bathed by patient: Right arm, Left arm, Chest, Abdomen, Right upper leg, Left upper leg, Face   Body parts bathed by helper: Front perineal area, Buttocks     Bathing assist Assist Level: 2 Helpers     Upper Body Dressing/Undressing Upper body dressing   What is the patient wearing?: Pull over shirt    Upper body assist Assist Level: Supervision/Verbal cueing    Lower Body Dressing/Undressing Lower body dressing      What is the patient wearing?: Pants, Incontinence brief     Lower body assist Assist for lower body dressing: 2 Helpers     Toileting Toileting    Toileting assist Assist for toileting: Dependent - Patient 0%     Transfers Chair/bed transfer  Transfers assist     Chair/bed transfer assist level: Moderate Assistance - Patient 50 - 74%     Locomotion Ambulation   Ambulation assist   Ambulation activity did not occur: Safety/medical concerns  Assist level: Minimal Assistance - Patient > 75% Assistive device: Walker-rolling Max distance: 31ft   Walk 10 feet activity   Assist  Walk 10 feet activity did not occur: Safety/medical concerns  Assist level: Minimal Assistance - Patient > 75% Assistive device: Walker-rolling   Walk 50 feet activity   Assist Walk 50 feet with 2 turns activity did not occur: Safety/medical concerns  Assist level: Minimal Assistance - Patient > 75% Assistive device: Walker-rolling    Walk 150 feet activity   Assist Walk 150 feet activity did not occur: Safety/medical concerns  Walk 10 feet on uneven surface  activity   Assist Walk 10 feet on uneven surfaces activity did not occur: Safety/medical concerns         Wheelchair     Assist Will patient use wheelchair at discharge?: No (No PT long term goals ) Type of Wheelchair: Manual    Wheelchair assist level: Minimal Assistance - Patient > 75% Max wheelchair distance: 32ft    Wheelchair 50 feet with 2 turns  activity    Assist        Assist Level: Minimal Assistance - Patient > 75%, Moderate Assistance - Patient 50 - 74%   Wheelchair 150 feet activity     Assist      Assist Level: Maximal Assistance - Patient 25 - 49%   Blood pressure (!) 107/54, pulse 73, temperature 97.9 F (36.6 C), resp. rate 16, weight 60.3 kg, SpO2 97 %.  Medical Problem List and Plan: 1.  Altered mental status secondary to acute metabolic encephalopathy related to possible drug overdose as well as history of multiple CVAs/vascular dementia  Continue CIR 2.  Antithrombotics: -DVT/anticoagulation: Eliquis             -antiplatelet therapy: Aspirin 81 mg daily, Plavix 75 mg daily 3. Pain Management: Neurontin 200 mg 3 times daily, Cymbalta 20 mg daily.   Controlled on 9/11 4. Mood: Provide emotional support             -antipsychotic agents: Haldol 1 mg BID, Zyprexa 12.5 mg nightly 5. Neuropsych: This patient is not fully capable of making decisions on his own behalf. 6. Skin/Wound Care: Routine skin checks  Prevalon boot ordered for left heel, awaiting lateral follow-up with nursing 7. Fluids/Electrolytes/Nutrition: Routine in and outs with follow-up chemistries 8.  Atrial fibrillation.  Amiodarone 400 mg daily.  Controlled on 9/11 9.  Chronic left hallux osteomyelitis.  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 Caroline More.  Weightbearing as tolerated through left heel only with postoperative shoe  Needs cues to remember WB precautions 10.  Hypertension.  Monitor with increased mobility.   Controlled on 9/11 11.  Diabetes mellitus with peripheral neuropathy.  Hemoglobin A1c 7.9.    NovoLog 6 units 3 times daily with meals, increased to 9 3 times daily on 9/9  Diabetic teaching.   Increased Lantus to 14U on 9/7.   CBG (last 3)  Recent Labs    02/10/20 2106 02/11/20 0624 02/11/20 1142  GLUCAP 204* 128* 212*   Remains elevated,? Secondary to infection, will work-up for infection  prior to making further medication adjustments 12.  CKD stage III as well as bouts of hyperkalemia.  Admission creatinine 1.59.  Continue Lokelma 5 mg twice daily.  Creatinine 1.61 on 9/10  Encourage fluids 13.  Hyperlipidemia.  Lipitor 14.  CAD with history of pacemaker.  Continue aspirin 15.  Chronic systolic congestive heart failure.  Latest ejection fraction 40 to 45%.  Monitor for any signs of fluid overload   Filed Weights   02/09/20 0508 02/10/20 0352 02/11/20 0344  Weight: 59.2 kg 57.1 kg 60.3 kg   Relatively stable on 9/11 16.  Urinary retention.    Increased Flomax to 0.8 mg daily.  17. Orthostatic hypotension: abdominal binder and TEDs ordered  Improving 18.  Thrombocytopenia  Platelets 64 on 9/10, severe drop, repeat labs ordered, also ordered for tomorrow and Monday.   Discussed with pharmacy - no obvious offending agents  Will monitor closely and may need to adjust anticoagulation due to  increased risk of bleeding  Continue to monitor 19.  Chronic hyponatremia  Sodium 126 on 9/10, labs ordered for Monday 20. Suspected UTI  Dysuria with foul odor  WBCs is 15.2 on 9/10  UA/urine culture ordered  Empiric Keflex started on 9/11  21. Acute blood loss anemia  Hemoglobin 8.7 on 9/10, repeat labs ordered  LOS: 8 days A FACE TO FACE EVALUATION WAS PERFORMED  Lyndal Alamillo Lorie Phenix 02/11/2020, 1:26 PM

## 2020-02-11 NOTE — Progress Notes (Signed)
Occupational Therapy Weekly Progress Note  Patient Details  Name: Jason Montes MRN: [id number] Date of Birth: [birth year]  Beginning of progress report period: February 04, 2020 End of progress report period: February 11, 2020  Patient has met 1 of 4 short term goals.  Pt was making great progress towards goals, however on 9/10 pt requiring increased physical assistance for sit > stand and functional, self-care tasks.  Prior to 9/10 pt able to complete sit > stand to RW or table top with min assist and ambulatory transfers with RW min assist.  Pt still required max assist with LB dressing and donning shoe and post-op shoe.  Have begun to introduce AE to decrease burden of care with LB dressing, however pt with decreased carryover due to impaired memory.  On 9/10, pt reports fatigue but agreeable to therapy session.  Required increased assistance for bed mobility but then able to ambulate ~5' with RW with min assist. Pt unable to complete subsequent sit > stands without max assist.  Pt unable to complete full upright stance, despite +2 assistance. As of today per PT report, pt continues to require max assist for sit > stand and mod - max assist for stand pivot transfers.    Patient continues to demonstrate the following deficits: muscle weakness, decreased cardiorespiratoy endurance, impaired timing and sequencing, field cut, decreased attention to right, decreased initiation, decreased awareness, decreased problem solving, decreased safety awareness and decreased memory and decreased standing balance, decreased postural control and difficulty maintaining precautions and therefore will continue to benefit from skilled OT intervention to enhance overall performance with BADL and Reduce care partner burden.  Patient progressing toward long term goals..  Continue plan of care.  OT Short Term Goals Week 1:  OT Short Term Goal 1 (Week 1): Pt will perform toilet transfers with Min A OT Short Term  Goal 1 - Progress (Week 1): Progressing toward goal OT Short Term Goal 2 (Week 1): Pt will transition sit to stand with proper body mechanics with Min A in prep for LB ADLs OT Short Term Goal 2 - Progress (Week 1): Met OT Short Term Goal 3 (Week 1): Pt will perform LB dress with AE PRN with Min A OT Short Term Goal 3 - Progress (Week 1): Progressing toward goal OT Short Term Goal 4 (Week 1): Pt will don post surgical shoe and R regular shoe with Min A OT Short Term Goal 4 - Progress (Week 1): Progressing toward goal Week 2:  OT Short Term Goal 1 (Week 2): Pt will perform toilet transfers with Min A OT Short Term Goal 2 (Week 2): Pt will perform LB dress with AE PRN with Min A OT Short Term Goal 3 (Week 2): Pt will don post surgical shoe and R regular shoe with Min A OT Short Term Goal 4 (Week 2): Pt will maintain standing with CGA for 2 mins to complete functional task    [name], [facility] 02/11/2020, 1:26 PM

## 2020-02-11 NOTE — Progress Notes (Signed)
Speech Language Pathology Daily Session Note  Patient Details  Name: Jason Montes MRN: [id number] Date of Birth: 20-Sep-1950  Today's Date: 02/11/2020 SLP Individual Time: 6659-9357 SLP Individual Time Calculation (min): 45 min  Short Term Goals: Week 2: SLP Short Term Goal 1 (Week 2): Patient will recall basic, daily information with min A verbal cues for use of memory compensatory strategies and extrenal aids. SLP Short Term Goal 2 (Week 2): Patient will demonstrate functional problem solving for basic and familiar tasks with supervison verbal cues. SLP Short Term Goal 3 (Week 2): Patient will self-monitor and correct errors during functional tasks with supervision verbal cues.  Skilled Therapeutic Interventions:   Patient seen for skilled ST session focusing on cognitive function goals. Patient stated year as "[age]" and required visual aids for orientation to date, day of week and month. He stated he was in "[facility]". When SLP read him excerpts from previous therapies completed with OT and PT, patient did recall walking with PT "the other day" and that he was "showing off" and now feels "everything is broken and I don't like it". SLP read a similar comment in recent PT, OT therapy note. 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 He had difficulty when trying to recall years/dates of events he was describing. He did exhibit some semantic paraphasias without awareness. When SLP was assisting patient by looking in chart for his biographical history, and asked patient about the listed "unintentional overdose", he informed SLP that "The thing is, I tried to kill myself, but I wasn't successful". He also stated that he had tried to kill himself with overdose on medications before  and that he had recently promised to his sister that he would not try again. Patient does demonstrate some increased ability to recall and describe recent events with written/visual cues. He continues to benefit from skilled SLP intervention to maximize cognitive-linguistic abilities, however will not expect him to exceed baseline cognitive deficits and will plan for discharge when at/near baseline.  Pain Pain Assessment Pain Scale: 0-10 Pain Score: 0-No pain  Therapy/Group: Individual Therapy  Sonia Baller, MA, CCC-SLP 02/11/20 4:08 PM

## 2020-02-11 NOTE — Progress Notes (Signed)
Occupational Therapy Session Note  Patient Details  Name: Jason Montes MRN: [id number] Date of Birth: 1950/12/12  Today's Date: 02/11/2020 OT Individual Time: 1300-1345 OT Individual Time Calculation (min): 45 min    Short Term Goals: Week 1:  OT Short Term Goal 1 (Week 1): Pt will perform toilet transfers with Min A OT Short Term Goal 2 (Week 1): Pt will transition sit to stand with proper body mechanics with Min A in prep for LB ADLs OT Short Term Goal 3 (Week 1): Pt will perform LB dress with AE PRN with Min A OT Short Term Goal 4 (Week 1): Pt will don post surgical shoe and R regular shoe with Min A  Skilled Therapeutic Interventions/Progress Updates:    Pt received supine with no c/o pain, agreeable to OT session. Pt completed bed mobility with min cueing for technique and (S). Shoes donned max A. Pt completed squat pivot transfer to the w/c with mod cueing for positioning and min A overall. Pt initially was set up to bathe at sink but then requested to take a shower. Pt had another full OT session this afternoon so alerted the next OT to his request, when he would have more time to complete. Pt was taken to the therapy gym via w/c. Pt used RW to complete sit > stand from w/c and completed 10 ft of functional mobility to the therapy mat. He was cued for positioning of RW and transfer techniques. Pt completed 4x blocked practice of this for functional activity tolerance and ensure carryover of techniques. Pt completed brief BUE shoulder flexion exercises with focus on pectoralis activation and upright posture. Pt returned to his w/c and to his room. He was left sitting up with all needs met, chair alarm set.   Therapy Documentation Precautions:  Precautions Precautions: Fall, Other (comment) Precaution Comments: LLE WBAT heel only with post-op shoe Required Braces or Orthoses: Other Brace Other Brace: postop shoe Restrictions Weight Bearing Restrictions: No RLE Weight Bearing:  Weight bearing as tolerated LLE Weight Bearing: Weight bearing as tolerated (heel WB with postop shoe) LLE Partial Weight Bearing Percentage or Pounds:  (with ortho boot)  Therapy/Group: Individual Therapy  Curtis Sites 02/11/2020, 8:00 AM

## 2020-02-12 ENCOUNTER — Inpatient Hospital Stay (HOSPITAL_COMMUNITY): Payer: Medicare Other

## 2020-02-12 DIAGNOSIS — R45851 Suicidal ideations: Secondary | ICD-10-CM

## 2020-02-12 LAB — GLUCOSE, CAPILLARY
Glucose-Capillary: 214 mg/dL — ABNORMAL HIGH (ref 70–99)
Glucose-Capillary: 181 mg/dL — ABNORMAL HIGH (ref 70–99)
Glucose-Capillary: 285 mg/dL — ABNORMAL HIGH (ref 70–99)
Glucose-Capillary: 219 mg/dL — ABNORMAL HIGH (ref 70–99)

## 2020-02-12 LAB — CBC WITH DIFFERENTIAL/PLATELET
Abs Immature Granulocytes: 0.03 10*3/uL (ref 0.00–0.07)
Basophils Absolute: 0 10*3/uL (ref 0.0–0.1)
Basophils Relative: 0 %
Eosinophils Absolute: 0.4 10*3/uL (ref 0.0–0.5)
Eosinophils Relative: 5 %
HCT: 28.1 % — ABNORMAL LOW (ref 39.0–52.0)
Hemoglobin: 8.9 g/dL — ABNORMAL LOW (ref 13.0–17.0)
Immature Granulocytes: 0 %
Lymphocytes Relative: 11 %
Lymphs Abs: 0.9 10*3/uL (ref 0.7–4.0)
MCH: 28.1 pg (ref 26.0–34.0)
MCHC: 31.7 g/dL (ref 30.0–36.0)
MCV: 88.6 fL (ref 80.0–100.0)
Monocytes Absolute: 0.9 10*3/uL (ref 0.1–1.0)
Monocytes Relative: 11 %
Neutro Abs: 6.1 10*3/uL (ref 1.7–7.7)
Neutrophils Relative %: 73 %
RBC: 3.17 MIL/uL — ABNORMAL LOW (ref 4.22–5.81)
RDW: 16.1 % — ABNORMAL HIGH (ref 11.5–15.5)
WBC: 8.3 10*3/uL (ref 4.0–10.5)
nRBC: 0 % (ref 0.0–0.2)

## 2020-02-12 MED ORDER — SODIUM CHLORIDE 0.9 % IV SOLN
2.0000 g | INTRAVENOUS | Status: DC
  Administered 2020-02-12: 2 g via INTRAVENOUS
  Filled 2020-02-12 (×2): qty 2

## 2020-02-12 NOTE — Progress Notes (Signed)
[facility] PHYSICAL MEDICINE & REHABILITATION PROGRESS NOTE   Subjective/Complaints: Patient seen laying in bed this morning.  He states he slept well overnight.  He notes persistent dysuria.  He has his Prevalon boot on in place.  ROS: + Dysuria.  Denies CP, SOB, N/V/D  Objective:   No results found. Recent Labs    02/11/20 1430 02/12/20 0550  WBC 10.5 8.3  HGB 9.2* 8.9*  HCT 30.8* 28.1*  PLT PLATELET CLUMPS NOTED ON SMEAR, UNABLE TO ESTIMATE PLATELET CLUMPING, SUGGEST RECOLLECTION OF SAMPLE IN CITRATE TUBE.   Recent Labs    02/10/20 1135  NA 126*  K 4.5  CL 95*  CO2 22  GLUCOSE 240*  BUN 28*  CREATININE 1.61*  CALCIUM 8.5*    Intake/Output Summary (Last 24 hours) at 02/12/2020 0805 Last data filed at 02/11/2020 2129 Gross per 24 hour  Intake 700 ml  Output 875 ml  Net -175 ml     Physical Exam: Vital Signs Blood pressure (!) 106/54, pulse 79, temperature 97.9 F (36.6 C), resp. rate 16, weight 73.2 kg, SpO2 96 %. Constitutional: No distress . Vital signs reviewed. HENT: Normocephalic.  Atraumatic. Eyes: EOMI. No discharge. Cardiovascular: No JVD.  RRR. Respiratory: Normal effort.  No stridor.  Bilateral clear to auscultation. GI: Non-distended.  BS +. Skin: Left leg dressing CDI Psych: Normal mood.  Normal behavior. Musc: Left great toe with edema and tenderness, improving Neuro: Alert Motor: 4+/5 throughout, except left foot, unchanged Bilateral upper extremity resting tremors, stable  Assessment/Plan: 1. Functional deficits secondary to acute metabolic encephalopathy/drug overdose and hx of multiple CVAs which require 3+ hours per day of interdisciplinary therapy in a comprehensive inpatient rehab setting.  Physiatrist is providing close team supervision and 24 hour management of active medical problems listed below.  Physiatrist and rehab team continue to assess barriers to discharge/monitor patient progress toward functional and medical  goals  Care Tool:  Bathing    Body parts bathed by patient: Right arm, Left arm, Chest, Abdomen, Right upper leg, Left upper leg, Face, Front perineal area   Body parts bathed by helper: Right lower leg, Buttocks Body parts n/a: Left lower leg   Bathing assist Assist Level: Minimal Assistance - Patient > 75%     Upper Body Dressing/Undressing Upper body dressing   What is the patient wearing?: Pull over shirt    Upper body assist Assist Level: Set up assist    Lower Body Dressing/Undressing Lower body dressing      What is the patient wearing?: Pants, Incontinence brief     Lower body assist Assist for lower body dressing: Maximal Assistance - Patient 25 - 49%     Toileting Toileting    Toileting assist Assist for toileting: Dependent - Patient 0%     Transfers Chair/bed transfer  Transfers assist     Chair/bed transfer assist level: Moderate Assistance - Patient 50 - 74%     Locomotion Ambulation   Ambulation assist   Ambulation activity did not occur: Safety/medical concerns  Assist level: Minimal Assistance - Patient > 75% Assistive device: Walker-rolling Max distance: 4ft   Walk 10 feet activity   Assist  Walk 10 feet activity did not occur: Safety/medical concerns  Assist level: Minimal Assistance - Patient > 75% Assistive device: Walker-rolling   Walk 50 feet activity   Assist Walk 50 feet with 2 turns activity did not occur: Safety/medical concerns  Assist level: Minimal Assistance - Patient > 75% Assistive device: Walker-rolling  Walk 150 feet activity   Assist Walk 150 feet activity did not occur: Safety/medical concerns         Walk 10 feet on uneven surface  activity   Assist Walk 10 feet on uneven surfaces activity did not occur: Safety/medical concerns         Wheelchair     Assist Will patient use wheelchair at discharge?: No (No PT long term goals ) Type of Wheelchair: Manual    Wheelchair assist  level: Minimal Assistance - Patient > 75% Max wheelchair distance: 53ft    Wheelchair 50 feet with 2 turns activity    Assist        Assist Level: Minimal Assistance - Patient > 75%, Moderate Assistance - Patient 50 - 74%   Wheelchair 150 feet activity     Assist      Assist Level: Maximal Assistance - Patient 25 - 49%   Blood pressure (!) 106/54, pulse 79, temperature 97.9 F (36.6 C), resp. rate 16, weight 73.2 kg, SpO2 96 %.  Medical Problem List and Plan: 1.  Altered mental status secondary to acute metabolic encephalopathy related to possible drug overdose as well as history of multiple CVAs/vascular dementia  Continue CIR 2.  Antithrombotics: -DVT/anticoagulation: Eliquis             -antiplatelet therapy: Aspirin 81 mg daily, Plavix 75 mg daily 3. Pain Management: Neurontin 200 mg 3 times daily, Cymbalta 20 mg daily.   Controlled on 9/12 4. Mood: Provide emotional support             -antipsychotic agents: Haldol 1 mg BID, Zyprexa 12.5 mg nightly 5. Neuropsych: This patient is not fully capable of making decisions on his own behalf. 6. Skin/Wound Care: Routine skin checks  Continue Prevalon boot for left heel 7. Fluids/Electrolytes/Nutrition: Routine in and outs with follow-up chemistries 8.  Atrial fibrillation.  Amiodarone 400 mg daily.  Controlled on 9/12 9.  Chronic left hallux osteomyelitis.  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 Caroline More.  Weightbearing as tolerated through left heel only with postoperative shoe  Needs cues to remember WB precautions 10.  Hypertension.  Monitor with increased mobility.   Controlled on 9/12 11.  Diabetes mellitus with peripheral neuropathy.  Hemoglobin A1c 7.9.    NovoLog 6 units 3 times daily with meals, increased to 9 3 times daily on 9/9  Diabetic teaching.   Increased Lantus to 14U on 9/7.   CBG (last 3)  Recent Labs    02/11/20 1657 02/11/20 2117 02/12/20 0636  GLUCAP 174* 333* 214*    Very labile on 9/12,confounded by infection, cont current meds at present 12.  CKD stage III as well as bouts of hyperkalemia.  Admission creatinine 1.59.  Continue Lokelma 5 mg twice daily.  Creatinine 1.61 on 9/10, labs ordered for tomorrow  Encourage fluids 13.  Hyperlipidemia.  Lipitor 14.  CAD with history of pacemaker.  Continue aspirin 15.  Chronic systolic congestive heart failure.  Latest ejection fraction 40 to 45%.  Monitor for any signs of fluid overload   Filed Weights   02/11/20 0344 02/12/20 0500 02/12/20 0623  Weight: 60.3 kg 60.2 kg 73.2 kg   ?  Reliability on 9/12 16.  Urinary retention.    Increased Flomax to 0.8 mg daily.  17. Orthostatic hypotension: abdominal binder and TEDs ordered  Improving 18.  Thrombocytopenia  Platelets 64 on 9/10 repeating clumps, labs ordered for tomorrow   Discussed with pharmacy - no  obvious offending agents  Will monitor closely and may need to adjust anticoagulation due to increased risk of bleeding  Continue to monitor 19.  Chronic hyponatremia  Sodium 126 on 9/10, labs ordered for tomorrow 20. Suspected UTI  Dysuria with foul odor  WBCs is 15.2 on 9/10  UA +,urine culture pending-however, patient was started on antibiotics prior to specimen  Empiric Keflex started on 9/11  21. Acute blood loss anemia  Hemoglobin 8.9 on 9/12, labs ordered for tomorrow  LOS: 9 days A FACE TO FACE EVALUATION WAS PERFORMED  Syvilla Martin Lorie Phenix 02/12/2020, 8:05 AM

## 2020-02-12 NOTE — Progress Notes (Signed)
Brief Note:  Discussed with therapies and nursing, pt's suicidal thoughts due to depression.  No plan.  Will order Psychiatry consult.

## 2020-02-12 NOTE — Progress Notes (Signed)
Physical Therapy Session Note  Patient Details  Name: Jason Montes MRN: [id number] Date of Birth: [birth year]  Today's Date: 02/12/2020 PT Individual Time: 7902-4097 PT Individual Time Calculation (min): 74 min   Short Term Goals: Week 1:  PT Short Term Goal 1 (Week 1): Pt will perform supine<>sit with min assist PT Short Term Goal 1 - Progress (Week 1): Met PT Short Term Goal 2 (Week 1): Pt will perform sit<>stands using LRAD with min assist PT Short Term Goal 2 - Progress (Week 1): Progressing toward goal PT Short Term Goal 3 (Week 1): Pt will perform bed<>chair transfers with mod assist PT Short Term Goal 3 - Progress (Week 1): Met PT Short Term Goal 4 (Week 1): Pt will ambulate at least 50f using LRAD with mod assist PT Short Term Goal 4 - Progress (Week 1): Met Week 2:  PT Short Term Goal 1 (Week 2): pt will perform all bed mobility with CGA PT Short Term Goal 2 (Week 2): pt will transfer sit<>stand with LRAD and min A consistantly PT Short Term Goal 3 (Week 2): Pt will ambulate 241fwith LRAD CGA  Skilled Therapeutic Interventions/Progress Updates:   Received pt supine in bed, pt agreeable to therapy, and denied any pain during session. Session with emphasis on functional mobility/transfers, dressing, generalized strengthening, dynamic standing balance/coordination, ambulation, and improved activity tolerance. Donned R ted hose and non-skid sock and removed L LE Prevalon boot and donned L post-op shoe total A. Donned pants in supine with mod A and pt rolled L and R with supervision and use of bedrails and required min A, increased time, and cues to pull pants over hips. Pt transferred supine<>sitting EOB from flat bed using bedrails with mod A with cues for logroll technique. Donned pull over shirt sitting EOB with min A with cues for anterior weight shifting to keep feet on floor to prevent posterior lean. Donned R shoe total A. Pt transferred stand<>pivot bed<>WC with RW min A  from elevated bed and washed face, brushed teeth, and applied deodorant sitting in WC at sink with supervision. Pt performed WC mobility 5060fsing bilateral UEs and supervision with cues for propulsion technique. Pt with increased frustration steering WC and politely declined to continue. Therapist transported pt remainder of way to gym total A. Pt transferred sit<>stand with RW mod A with cues for anterior weight shifting and upright posture and ambulated [id number] trials with RW and min A. While ambulating pt stopped and stated "I need to sit now" but was unaware that he was not near sitting surface. Pt required extensive rest break afterwards. Pt demonstrates decreased stride length, narrow BOS, decreased trunk rotation, and decreased weight shifting to L and required cues for RW safety when turning. Pt transferred WC<>Nustep stand<>pivot with RW mod A x2 trials with cues for turning technique and safety. Pt required total A to place LEs on Nustep footplate's due to weakness. Pt performed bilateral UE/LE strengthening on Nustep at workload 2 for 8 minutes with 5 rest breaks for a total of 226 steps for improved cardiovascular endurance. Pt stated "I don't know what's wrong with me today, I feel weaker than I did when I first got here, someone's poisoning me." Therapist notified MD and RN and provided rest breaks and encouragement as needed. Pt requested to return to room and performed the following exercises [facility] and verbal cues for technique: -hip flexion x10 bilaterally -knee extension x10 bilaterally -hip adduction pillow squeezes  2x10 Concluded session with pt sitting in WC, needs within reach, and seatbelt alarm on. Therapist provided fresh drink for pt.   Therapy Documentation Precautions:  Precautions Precautions: Fall, Other (comment) Precaution Comments: LLE WBAT heel only with post-op shoe Required Braces or Orthoses: Other Brace Other Brace: postop  shoe Restrictions Weight Bearing Restrictions: No RLE Weight Bearing: Weight bearing as tolerated LLE Weight Bearing: Weight bearing as tolerated (heel WB with postop shoe) LLE Partial Weight Bearing Percentage or Pounds:  (with ortho boot)   Therapy/Group: Individual Therapy [name] PT, DPT   02/12/2020, 7:31 AM

## 2020-02-12 NOTE — Progress Notes (Signed)
Patient woke up confused. This nurse asked if he knew where he was and the patient replied:" I'm at [employer] behind the science books over by the window." This nurse reoriented patient and repositioned for comfort. Patient is currently sleeping. Will continue to monitor.

## 2020-02-12 NOTE — Progress Notes (Signed)
Entered patient's room to administer 12pm medications, patient was eating lunch. Patient verbalized "I'm far from okay." Patient expressed that he was "at the end of his rope" and "tired of faking that he was okay". Patient was visibly upset and crying.  He also expressed problems within his family and feelings of guilt and shame. Writer asked patient if he had any thoughts of harming himself or if he had a plan to commit suicide and patient replied "no". Writer provided emotional support. Charge nurse made aware.  Notified MD, no new orders received.

## 2020-02-13 ENCOUNTER — Inpatient Hospital Stay (HOSPITAL_COMMUNITY): Payer: Medicare Other

## 2020-02-13 ENCOUNTER — Inpatient Hospital Stay (HOSPITAL_COMMUNITY): Payer: Medicare Other | Admitting: Occupational Therapy

## 2020-02-13 LAB — BASIC METABOLIC PANEL
Anion gap: 8 (ref 5–15)
BUN: 20 mg/dL (ref 8–23)
CO2: 24 mmol/L (ref 22–32)
Calcium: 8.8 mg/dL — ABNORMAL LOW (ref 8.9–10.3)
Chloride: 101 mmol/L (ref 98–111)
Creatinine, Ser: 1.34 mg/dL — ABNORMAL HIGH (ref 0.61–1.24)
GFR calc Af Amer: >60 mL/min (ref >60)
GFR calc non Af Amer: 54 mL/min — ABNORMAL LOW (ref >60)
Glucose, Bld: 231 mg/dL — ABNORMAL HIGH (ref 70–99)
Potassium: 4.1 mmol/L (ref 3.5–5.1)
Sodium: 133 mmol/L — ABNORMAL LOW (ref 135–145)

## 2020-02-13 LAB — URINE CULTURE: Culture: >=100000 — AB

## 2020-02-13 LAB — CBC WITH DIFFERENTIAL/PLATELET
Abs Immature Granulocytes: 0.12 10*3/uL — ABNORMAL HIGH (ref 0.00–0.07)
Basophils Absolute: 0 10*3/uL (ref 0.0–0.1)
Basophils Relative: 0 %
Eosinophils Absolute: 0.5 10*3/uL (ref 0.0–0.5)
Eosinophils Relative: 6 %
HCT: 29.7 % — ABNORMAL LOW (ref 39.0–52.0)
Hemoglobin: 9.2 g/dL — ABNORMAL LOW (ref 13.0–17.0)
Immature Granulocytes: 1 %
Lymphocytes Relative: 9 %
Lymphs Abs: 0.8 10*3/uL (ref 0.7–4.0)
MCH: 27.5 pg (ref 26.0–34.0)
MCHC: 31 g/dL (ref 30.0–36.0)
MCV: 88.9 fL (ref 80.0–100.0)
Monocytes Absolute: 0.8 10*3/uL (ref 0.1–1.0)
Monocytes Relative: 9 %
Neutro Abs: 6.3 10*3/uL (ref 1.7–7.7)
Neutrophils Relative %: 75 %
RBC: 3.34 MIL/uL — ABNORMAL LOW (ref 4.22–5.81)
RDW: 16.2 % — ABNORMAL HIGH (ref 11.5–15.5)
WBC: 8.5 10*3/uL (ref 4.0–10.5)
nRBC: 0 % (ref 0.0–0.2)

## 2020-02-13 LAB — GLUCOSE, CAPILLARY
Glucose-Capillary: 201 mg/dL — ABNORMAL HIGH (ref 70–99)
Glucose-Capillary: 185 mg/dL — ABNORMAL HIGH (ref 70–99)
Glucose-Capillary: 244 mg/dL — ABNORMAL HIGH (ref 70–99)
Glucose-Capillary: 163 mg/dL — ABNORMAL HIGH (ref 70–99)

## 2020-02-13 MED ORDER — PROSOURCE PLUS PO LIQD
30.0000 mL | Freq: Two times a day (BID) | ORAL | Status: DC
  Administered 2020-02-14 – 2020-02-27 (×16): 30 mL via ORAL
  Filled 2020-02-13 (×20): qty 30

## 2020-02-13 MED ORDER — ZINC SULFATE 220 (50 ZN) MG PO CAPS
220.0000 mg | ORAL_CAPSULE | Freq: Every day | ORAL | Status: DC
  Administered 2020-02-13 – 2020-02-28 (×16): 220 mg via ORAL
  Filled 2020-02-13 (×16): qty 1

## 2020-02-13 MED ORDER — CEPHALEXIN 250 MG PO CAPS
500.0000 mg | ORAL_CAPSULE | Freq: Three times a day (TID) | ORAL | Status: AC
  Administered 2020-02-13 – 2020-02-15 (×8): 500 mg via ORAL
  Filled 2020-02-13 (×8): qty 2

## 2020-02-13 NOTE — Progress Notes (Addendum)
Speech Language Pathology Daily Session Note  Patient Details  Name: Jason Montes MRN: [id number] Date of Birth: [birth year]  Today's Date: 02/13/2020 SLP Individual Time: 1000-1055 SLP Individual Time Calculation (min): 55 min  Short Term Goals: Week 2: SLP Short Term Goal 1 (Week 2): Patient will recall basic, daily information with min A verbal cues for use of memory compensatory strategies and extrenal aids. SLP Short Term Goal 2 (Week 2): Patient will demonstrate functional problem solving for basic and familiar tasks with supervison verbal cues. SLP Short Term Goal 3 (Week 2): Patient will self-monitor and correct errors during functional tasks with supervision verbal cues.  Skilled Therapeutic Interventions:Skilled ST services focused on cognitive skills. SLP facilitated recall of am PT events, pt independently recalled x1 event and with use of memory notebook recalled events/therapy/therapist name with mod A verbal cues due to reading deficits. SLP facilitated basic problem solving, error awareness and recall skills in novel card task, pt required min A fade to supervision A verbal cues when task played at basic level and required max A verbal cues at semi-complex level for problem solving. Pt demonstrates good awareness of deficits and impact on function, stating " this fried my brain" referring to the increase in complexity of the task. Pt required min A verbal cues to utilize visual aid to recall 3 rules during card task. SLP provided duaghter's phone number, per pt request and pt was able to dial number when numbers were read aloud. SLP allowed pt privacy to communicate with daughter, when he appeared teary. Chaplin was waiting outside pt's room and SLP informed pt who requested her to enter. Pt was left in room with chaplin, call bell within reach and chair alarm set. SLP recommends to continue skilled ST services.      Pain Pain Assessment Pain Score: 0-No  pain  Therapy/Group: Individual Therapy  Edras Wilford  [facility] 02/13/2020, 1:36 PM

## 2020-02-13 NOTE — Progress Notes (Signed)
[facility] PHYSICAL MEDICINE & REHABILITATION PROGRESS NOTE   Subjective/Complaints: Feels well this morning. Said he had a bad time yesterday and can't remember why. Feels the last 4 nights he has been having more pain throughout his body. Denies pain.   ROS:  Denies CP, SOB, N/V/D  Objective:   No results found. Recent Labs    02/12/20 0550 02/13/20 0728  WBC 8.3 8.5  HGB 8.9* 9.2*  HCT 28.1* 29.7*  PLT PLATELET CLUMPING, SUGGEST RECOLLECTION OF SAMPLE IN CITRATE TUBE. PLATELET CLUMPING, SUGGEST RECOLLECTION OF SAMPLE IN CITRATE TUBE.   Recent Labs    02/10/20 1135 02/13/20 0728  NA 126* 133*  K 4.5 4.1  CL 95* 101  CO2 22 24  GLUCOSE 240* 231*  BUN 28* 20  CREATININE 1.61* 1.34*  CALCIUM 8.5* 8.8*    Intake/Output Summary (Last 24 hours) at 02/13/2020 0840 Last data filed at 02/13/2020 0801 Gross per 24 hour  Intake 1000 ml  Output 850 ml  Net 150 ml     Physical Exam: Vital Signs Blood pressure 126/68, pulse 76, temperature 97.9 F (36.6 C), temperature source Oral, resp. rate 18, weight 74.5 kg, SpO2 98 %. General: Alert and oriented x 3, No apparent distress HEENT: Head is normocephalic, atraumatic, PERRLA, EOMI, sclera anicteric, oral mucosa pink and moist, dentition intact, ext ear canals clear,  Neck: Supple without JVD or lymphadenopathy Heart: Reg rate and rhythm. No murmurs rubs or gallops Chest: CTA bilaterally without wheezes, rales, or rhonchi; no distress Abdomen: Soft, non-tender, non-distended, bowel sounds positive. Extremities: No clubbing, cyanosis, or edema. Pulses are 2+ Skin: Left leg dressing CDI Psych: Normal mood.  Normal behavior. Musc: Left great toe with edema and tenderness, improving Neuro: Alert Motor: 4+/5 throughout, except left foot, unchanged Bilateral upper extremity resting tremors, stable   Assessment/Plan: 1. Functional deficits secondary to acute metabolic encephalopathy/drug overdose and hx of multiple CVAs which  require 3+ hours per day of interdisciplinary therapy in a comprehensive inpatient rehab setting.  Physiatrist is providing close team supervision and 24 hour management of active medical problems listed below.  Physiatrist and rehab team continue to assess barriers to discharge/monitor patient progress toward functional and medical goals  Care Tool:  Bathing    Body parts bathed by patient: Right arm, Left arm, Chest, Abdomen, Right upper leg, Left upper leg, Face, Front perineal area   Body parts bathed by helper: Right lower leg, Buttocks Body parts n/a: Left lower leg   Bathing assist Assist Level: Minimal Assistance - Patient > 75%     Upper Body Dressing/Undressing Upper body dressing   What is the patient wearing?: Pull over shirt    Upper body assist Assist Level: Set up assist    Lower Body Dressing/Undressing Lower body dressing      What is the patient wearing?: Pants, Incontinence brief     Lower body assist Assist for lower body dressing: Maximal Assistance - Patient 25 - 49%     Toileting Toileting    Toileting assist Assist for toileting: Dependent - Patient 0%     Transfers Chair/bed transfer  Transfers assist     Chair/bed transfer assist level: Minimal Assistance - Patient > 75%     Locomotion Ambulation   Ambulation assist   Ambulation activity did not occur: Safety/medical concerns  Assist level: Minimal Assistance - Patient > 75% Assistive device: Walker-rolling Max distance: 20ft   Walk 10 feet activity   Assist  Walk 10 feet activity did  not occur: Safety/medical concerns  Assist level: Minimal Assistance - Patient > 75% Assistive device: Walker-rolling   Walk 50 feet activity   Assist Walk 50 feet with 2 turns activity did not occur: Safety/medical concerns  Assist level: Minimal Assistance - Patient > 75% Assistive device: Walker-rolling    Walk 150 feet activity   Assist Walk 150 feet activity did not occur:  Safety/medical concerns         Walk 10 feet on uneven surface  activity   Assist Walk 10 feet on uneven surfaces activity did not occur: Safety/medical concerns         Wheelchair     Assist Will patient use wheelchair at discharge?: No Type of Wheelchair: Manual    Wheelchair assist level: Supervision/Verbal cueing Max wheelchair distance: 57ft    Wheelchair 50 feet with 2 turns activity    Assist        Assist Level: Supervision/Verbal cueing   Wheelchair 150 feet activity     Assist      Assist Level: Maximal Assistance - Patient 25 - 49%   Blood pressure 126/68, pulse 76, temperature 97.9 F (36.6 C), temperature source Oral, resp. rate 18, weight 74.5 kg, SpO2 98 %.  Medical Problem List and Plan: 1.  Altered mental status secondary to acute metabolic encephalopathy related to possible drug overdose as well as history of multiple CVAs/vascular dementia  Continue CIR 2.  Antithrombotics: -DVT/anticoagulation: Eliquis             -antiplatelet therapy: Aspirin 81 mg daily, Plavix 75 mg daily 3. Pain Management: Neurontin 200 mg 3 times daily, Cymbalta 20 mg daily.   Controlled 9/13 4. Mood: Provide emotional support             -antipsychotic agents: Haldol 1 mg BID, Zyprexa 12.5 mg nightly  9/13: Mood positive this morning. Psych consult ordered yesterday due to reports of depressed mood. He is agreeable to this.  5. Neuropsych: This patient is not fully capable of making decisions on his own behalf. 6. Skin/Wound Care: Routine skin checks  Continue Prevalon boot for left heel 7. Fluids/Electrolytes/Nutrition: Routine in and outs with follow-up chemistries 8.  Atrial fibrillation.  Amiodarone 400 mg daily.  Controlled on 9/12 9.  Chronic left hallux osteomyelitis.  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 Caroline More.  Weightbearing as tolerated through left heel only with postoperative shoe  Needs cues to remember WB  precautions 10.  Hypertension.  Monitor with increased mobility.   Controlled on 9/12 11.  Diabetes mellitus with peripheral neuropathy.  Hemoglobin A1c 7.9.    NovoLog 6 units 3 times daily with meals, increased to 9 3 times daily on 9/9  Diabetic teaching.   Increased Lantus to 14U on 9/7.   CBG (last 3)  Recent Labs    02/12/20 1645 02/12/20 2113 02/13/20 0624  GLUCAP 285* 219* 201*   9/13: CBGs elevated- diet changed to carb modified- no added sugar- as per patient request 12.  CKD stage III as well as bouts of hyperkalemia.  Admission creatinine 1.59.  Continue Lokelma 5 mg twice daily.  Creatinine 1.61 on 9/10, downtrending 9/13  Encourage fluids 13.  Hyperlipidemia.  Lipitor 14.  CAD with history of pacemaker.  Continue aspirin 15.  Chronic systolic congestive heart failure.  Latest ejection fraction 40 to 45%.  Monitor for any signs of fluid overload   Filed Weights   02/12/20 0500 02/12/20 0623 02/13/20 0515  Weight: 60.2 kg  73.2 kg 74.5 kg   ?  Reliability on 9/12 16.  Urinary retention.    Increased Flomax to 0.8 mg daily.  17. Orthostatic hypotension: abdominal binder and TEDs ordered  Improving 18.  Thrombocytopenia  Platelets 64 on 9/10 repeating clumps, repeat CBC 9/13; platelets clumped again this morning.  Discussed with pharmacy - no obvious offending agents  Will monitor closely and may need to adjust anticoagulation due to increased risk of bleeding  Continue to monitor 19.  Chronic hyponatremia  Sodium 126 on 9/10, Na improved to 9/13.  20. Suspected UTI  Dysuria with foul odor  WBCs is 15.2 on 9/10, down to 8.5 on 9/13.  UA +, UC + for >100,000 proteus mirabilis, susceptibilities pending.  Empiric Keflex started on 9/11  21. Acute blood loss anemia  Hemoglobin 8.9 on 9/12, Hgb up to 9.2 on 9/13 22. Constipation: prune juice with meals.  23. Dry mouth: ordered ice chips  >35 minutes spent in discussing patient's depressed mood yesterday, psychiatry  consult, discussing elevated CBGs and insulin regimen with RN, changing diet to carb modified with no added sugar, discussing constipation and recommending prune juice with meals- order added, reviewing Na/WBC/Hgb/Cr/UC/PC and discussing results with patient, reordering CBC due to elevated PC, discussing orthostasis which is improving, discussed dry mouth  LOS: 10 days A FACE TO FACE EVALUATION WAS PERFORMED  Jock Mahon P Arthuro Canelo 02/13/2020, 8:40 AM

## 2020-02-13 NOTE — Consult Note (Signed)
[facility] Consult   Reason for Consult:  Suicidal ideations Referring Physician:  Dr. Posey Pronto Patient Identification: Jason Montes MRN:  [id number] Principal Diagnosis: Acute metabolic encephalopathy Diagnosis:  Principal Problem:   Acute metabolic encephalopathy Active Problems:   Post-operative pain   Benign essential HTN   Diabetic peripheral neuropathy ([place])   Labile blood glucose   Chronic systolic congestive heart failure (HCC)   Urinary retention   Thrombocytopenia (HCC)   Hyponatremia   Acute blood loss anemia   Acute lower UTI   Suicidal ideation   Total Time spent with patient: 30 minutes  Subjective:   Jason Montes is a 69 y.o. male patient admitted with altered mental status.  Per patient he reports an intentional overdose on a bunch of pills.  He is not very forthcoming regarding his most recent suicide attempt.  He also reports a previous history of suicide attempt 21 years ago, that required medical admission.  He denies any current outpatient therapy is services.  He reports his current quality of life, chronic comorbid conditions, and loneliness contributed to this attempt.  However in the same sentence he does endorse feeling much better and having hope began."  He contributes these feelings to nursing staff, medical staff, and multidisciplinary services that he has received. "  I feel like a human again.  They gave me a lot of hope coming here.  I have not felt this good about myself in a long time.  I have to think everyone here."  Patient currently denies suicidal ideations, homicidal ideations, and or hallucinations.  He continues to refute any current desires and or future desires to attempt suicide.  He does express some interest in therapies once he is released from comprehensive inpatient rehab.  HPI:  Jason Montes is a 69 year old right-handed male with history of hypertension, hyperlipidemia, atrial fibrillation maintained  on Eliquis, CVA, diabetes mellitus, CKD stage III, CAD with cardiac arrest/pacemaker 2019 and resultant hypoxia with cognitive deficits  chronic left greater toe osteomyelitis with peripheral vascular disease, systolic congestive heart failure with ejection fraction of 40 to 45%.  Per chart review patient lives in a boardinghouse.  He has a daughter in the area.  Plan is return back to boarding home.  Presented 01/11/2020 with altered mental status.  There was some question of unintentional drug overdose.  Cranial CT scan negative for acute process.  Sequela of remote left occipital and left cerebellar insults.  Chest x-ray mild streaky bibasilar opacities favoring atelectasis.  EEG negative for seizure.  Urine drug screen positive tricyclic, sodium 462, sedimentation rate 83 glucose 115, BUN 34, creatinine 1.59, total bilirubin 1.4, hemoglobin 12, lactic acid 1.2, BNP 316.  Most recent echocardiogram ejection fraction of 40 to 45% no wall motion abnormalities.  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 Caroline More after cardiac clearance was obtained as well as later undergoing arteriography/aortogram of left lower extremity per vascular surgery Dr. Leotis Pain showing relatively normal common femoral artery profunda femoris artery and superficial femoral artery.  The posterior tibial artery was chronically occluded with no distal reconstitution.Marland Kitchen  Hospital course psychiatry service was consulted for possible drug overdose as well as capacity evaluation and patient was deemed independent to make medical decisions and has been maintained on Haldol 1 mg twice daily as well as Zyprexa via 12.5 mg nightly.  Patient with bouts of urinary retention and initial voiding trial failed with Foley catheter tube  remaining in place therapy evaluations completed patient is full weightbearing through left heel only after recent amputation with postoperative shoe.   Patient was admitted for a comprehensive rehab program  Past Psychiatric History: Schizoaffective, depression. No current outpatient services. Previous inpatient admission to [facility] for suicide attempt "21 years ago".   Risk to Self:  Denies Risk to Others:  Denies Prior Inpatient Therapy:   Prior Outpatient Therapy:    Past Medical History:  Past Medical History:  Diagnosis Date  . Allergy   . Atrial fibrillation ([place])   . B12 deficiency   . Chronic kidney disease   . Chronic kidney disease (CKD), stage II (mild)   . Coronary atherosclerosis   . Diabetes mellitus without complication ([place])   . Elevated PSA   . Esophagitis   . GERD (gastroesophageal reflux disease)   . Heart attack ([place])   . Heart disease   . Hyperlipidemia   . Hypertension   . Hypokalemia   . Mild neurocognitive disorder   . Osteoarthritis   . Presbyopia   . PVD (peripheral vascular disease) ([place])   . Restless leg syndrome   . Stroke (cerebrum) ([place])   . Stroke ([place])   . Subacute osteomyelitis ([place])   . Tinea unguium   . Uncompensated short term memory deficit     Past Surgical History:  Procedure Laterality Date  . AMPUTATION TOE Left 01/13/2020   Procedure: AMPUTATION RAY LEFT 1ST;  Surgeon: Caroline More, DPM;  Location: [facility] ORS;  Service: Podiatry;  Laterality: Left;  . CARDIAC PACEMAKER PLACEMENT    . COLONOSCOPY WITH PROPOFOL N/A 05/18/2018   Procedure: COLONOSCOPY WITH PROPOFOL;  Surgeon: [name], MD;  Location: [facility] ENDOSCOPY;  Service: Endoscopy;  Laterality: N/A;  . CORONARY ARTERY BYPASS GRAFT    . FOOT AMPUTATION Right   . INTRAMEDULLARY (IM) NAIL INTERTROCHANTERIC Right 11/16/2019   Procedure: INTRAMEDULLARY (IM) NAIL INTERTROCHANTRIC;  Surgeon: Thornton Park, MD;  Location: [facility] ORS;  Service: Orthopedics;  Laterality: Right;  . LOWER EXTREMITY ANGIOGRAPHY Left 10/10/2019   Procedure: Lower Extremity Angiography;  Surgeon: Algernon Huxley, MD;  Location: [facility] CV  LAB;  Service: Cardiovascular;  Laterality: Left;  . LOWER EXTREMITY ANGIOGRAPHY Left 01/16/2020   Procedure: Lower Extremity Angiography;  Surgeon: Algernon Huxley, MD;  Location: [facility] LAB;  Service: Cardiovascular;  Laterality: Left;   Family History:  Family History  Problem Relation Age of Onset  . Heart failure Mother   . Colon cancer Mother   . Diabetes Mother   . Thyroid disease Sister    Family Psychiatric  History: [place] Social History:  Social History   Substance and Sexual Activity  Alcohol Use Never     Social History   Substance and Sexual Activity  Drug Use Never    Social History   Socioeconomic History  . Marital status: Single    Spouse name: Not on file  . Number of children: Not on file  . Years of education: Not on file  . Highest education level: Not on file  Occupational History  . Not on file  Tobacco Use  . Smoking status: Never Smoker  . Smokeless tobacco: Never Used  Vaping Use  . Vaping Use: Never used  Substance and Sexual Activity  . Alcohol use: Never  . Drug use: Never  . Sexual activity: Not on file  Other Topics Concern  . Not on file  Social History Narrative  . Not on file  Social Determinants of Health   Financial Resource Strain:   . Difficulty of Paying Living Expenses: Not on file  Food Insecurity:   . Worried About Charity fundraiser in the Last Year: Not on file  . Ran Out of Food in the Last Year: Not on file  Transportation Needs:   . Lack of Transportation (Medical): Not on file  . Lack of Transportation (Non-Medical): Not on file  Physical Activity:   . Days of Exercise per Week: Not on file  . Minutes of Exercise per Session: Not on file  Stress:   . Feeling of Stress : Not on file  Social Connections:   . Frequency of Communication with Friends and Family: Not on file  . Frequency of Social Gatherings with Friends and Family: Not on file  . Attends Religious Services: Not on file  . Active  Member of Clubs or Organizations: Not on file  . Attends Archivist Meetings: Not on file  . Marital Status: Not on file   Additional Social History:    Allergies:   Allergies  Allergen Reactions  . Amlodipine Other (See Comments)  . Norvasc [Amlodipine Besylate]     Unknown  . Penicillins     Childhood allergy, not sure what happens     Labs:  Results for orders placed or performed during the hospital encounter of 02/03/20 (from the past 48 hour(s))  CBC     Status: Abnormal   Collection Time: 02/11/20  2:30 PM  Result Value Ref Range   WBC 10.5 4.0 - 10.5 K/uL    Comment: WHITE COUNT CONFIRMED ON SMEAR   RBC 3.44 (L) 4.22 - 5.81 MIL/uL   Hemoglobin 9.2 (L) 13.0 - 17.0 g/dL   HCT 30.8 (L) 39 - 52 %   MCV 89.5 80.0 - 100.0 fL   MCH 26.7 26.0 - 34.0 pg   MCHC 29.9 (L) 30.0 - 36.0 g/dL   RDW 16.2 (H) 11.5 - 15.5 %   Platelets PLATELET CLUMPS NOTED ON SMEAR, UNABLE TO ESTIMATE 150 - 400 K/uL   nRBC 0.0 0.0 - 0.2 %    Comment: Performed at [facility], [place] [address], [place], [place] [address]  Glucose, capillary     Status: Abnormal   Collection Time: 02/11/20  4:57 PM  Result Value Ref Range   Glucose-Capillary 174 (H) 70 - 99 mg/dL    Comment: Glucose reference range applies only to samples taken after fasting for at least 8 hours.  Urinalysis, Routine w reflex microscopic Urine, Clean Catch     Status: Abnormal   Collection Time: 02/11/20  5:30 PM  Result Value Ref Range   Color, Urine AMBER (A) YELLOW    Comment: BIOCHEMICALS MAY BE AFFECTED BY COLOR   APPearance TURBID (A) CLEAR   Specific Gravity, Urine 1.020 1.005 - 1.030   pH 8.0 5.0 - 8.0   Glucose, UA 50 (A) NEGATIVE mg/dL   Hgb urine dipstick NEGATIVE NEGATIVE   Bilirubin Urine NEGATIVE NEGATIVE   Ketones, ur NEGATIVE NEGATIVE mg/dL   Protein, ur >=300 (A) NEGATIVE mg/dL   Nitrite NEGATIVE NEGATIVE   Leukocytes,Ua LARGE (A) NEGATIVE   RBC / HPF 6-10 0 - 5 RBC/hpf   WBC, UA >50 (H) 0 - 5  WBC/hpf   Bacteria, UA RARE (A) NONE SEEN   WBC Clumps PRESENT    Mucus PRESENT     Comment: Performed at [facility], [address]  [place], [place] [address]  Glucose, capillary     Status: Abnormal   Collection Time: 02/11/20  9:17 PM  Result Value Ref Range   Glucose-Capillary 333 (H) 70 - 99 mg/dL    Comment: Glucose reference range applies only to samples taken after fasting for at least 8 hours.  CBC with Differential/Platelet     Status: Abnormal   Collection Time: 02/12/20  5:50 AM  Result Value Ref Range   WBC 8.3 4.0 - 10.5 K/uL   RBC 3.17 (L) 4.22 - 5.81 MIL/uL   Hemoglobin 8.9 (L) 13.0 - 17.0 g/dL   HCT 28.1 (L) 39 - 52 %   MCV 88.6 80.0 - 100.0 fL   MCH 28.1 26.0 - 34.0 pg   MCHC 31.7 30.0 - 36.0 g/dL   RDW 16.1 (H) 11.5 - 15.5 %   Platelets  150 - 400 K/uL    PLATELET CLUMPING, SUGGEST RECOLLECTION OF SAMPLE IN CITRATE TUBE.    Comment: Immature Platelet Fraction may be clinically indicated, consider ordering this additional test [id number]    nRBC 0.0 0.0 - 0.2 %   Neutrophils Relative % 73 %   Neutro Abs 6.1 1.7 - 7.7 K/uL   Lymphocytes Relative 11 %   Lymphs Abs 0.9 0.7 - 4.0 K/uL   Monocytes Relative 11 %   Monocytes Absolute 0.9 0 - 1 K/uL   Eosinophils Relative 5 %   Eosinophils Absolute 0.4 0 - 0 K/uL   Basophils Relative 0 %   Basophils Absolute 0.0 0 - 0 K/uL   Immature Granulocytes 0 %   Abs Immature Granulocytes 0.03 0.00 - 0.07 K/uL    Comment: Performed at [facility] [address], [place], [place] [address]  Glucose, capillary     Status: Abnormal   Collection Time: 02/12/20  6:36 AM  Result Value Ref Range   Glucose-Capillary 214 (H) 70 - 99 mg/dL    Comment: Glucose reference range applies only to samples taken after fasting for at least 8 hours.  Glucose, capillary     Status: Abnormal   Collection Time: 02/12/20 11:34 AM  Result Value Ref Range   Glucose-Capillary 181 (H) 70 - 99 mg/dL    Comment: Glucose reference  range applies only to samples taken after fasting for at least 8 hours.  Glucose, capillary     Status: Abnormal   Collection Time: 02/12/20  4:45 PM  Result Value Ref Range   Glucose-Capillary 285 (H) 70 - 99 mg/dL    Comment: Glucose reference range applies only to samples taken after fasting for at least 8 hours.  Glucose, capillary     Status: Abnormal   Collection Time: 02/12/20  9:13 PM  Result Value Ref Range   Glucose-Capillary 219 (H) 70 - 99 mg/dL    Comment: Glucose reference range applies only to samples taken after fasting for at least 8 hours.  Glucose, capillary     Status: Abnormal   Collection Time: 02/13/20  6:24 AM  Result Value Ref Range   Glucose-Capillary 201 (H) 70 - 99 mg/dL    Comment: Glucose reference range applies only to samples taken after fasting for at least 8 hours.  Basic metabolic panel     Status: Abnormal   Collection Time: 02/13/20  7:28 AM  Result Value Ref Range   Sodium 133 (L) 135 - 145 mmol/L   Potassium 4.1 3.5 - 5.1 mmol/L   Chloride 101 98 - 111 mmol/L   CO2  24 22 - 32 mmol/L   Glucose, Bld 231 (H) 70 - 99 mg/dL    Comment: Glucose reference range applies only to samples taken after fasting for at least 8 hours.   BUN 20 8 - 23 mg/dL   Creatinine, Ser 1.34 (H) 0.61 - 1.24 mg/dL   Calcium 8.8 (L) 8.9 - 10.3 mg/dL   GFR calc non Af Amer 54 (L) >60 mL/min   GFR calc Af Amer >60 >60 mL/min   Anion gap 8 5 - 15    Comment: Performed at [facility] [address] [place], [place] [address]  CBC with Differential/Platelet     Status: Abnormal   Collection Time: 02/13/20  7:28 AM  Result Value Ref Range   WBC 8.5 4.0 - 10.5 K/uL   RBC 3.34 (L) 4.22 - 5.81 MIL/uL   Hemoglobin 9.2 (L) 13.0 - 17.0 g/dL   HCT 29.7 (L) 39 - 52 %   MCV 88.9 80.0 - 100.0 fL   MCH 27.5 26.0 - 34.0 pg   MCHC 31.0 30.0 - 36.0 g/dL   RDW 16.2 (H) 11.5 - 15.5 %   Platelets  150 - 400 K/uL    PLATELET CLUMPING, SUGGEST RECOLLECTION OF SAMPLE IN CITRATE  TUBE.   nRBC 0.0 0.0 - 0.2 %   Neutrophils Relative % 75 %   Neutro Abs 6.3 1.7 - 7.7 K/uL   Lymphocytes Relative 9 %   Lymphs Abs 0.8 0.7 - 4.0 K/uL   Monocytes Relative 9 %   Monocytes Absolute 0.8 0 - 1 K/uL   Eosinophils Relative 6 %   Eosinophils Absolute 0.5 0 - 0 K/uL   Basophils Relative 0 %   Basophils Absolute 0.0 0 - 0 K/uL   Immature Granulocytes 1 %   Abs Immature Granulocytes 0.12 (H) 0.00 - 0.07 K/uL    Comment: Performed at [facility] [address], [place], [place] [address]  Glucose, capillary     Status: Abnormal   Collection Time: 02/13/20 11:39 AM  Result Value Ref Range   Glucose-Capillary 185 (H) 70 - 99 mg/dL    Comment: Glucose reference range applies only to samples taken after fasting for at least 8 hours.    Current Facility-Administered Medications  Medication Dose Route Frequency Provider Last Rate Last Admin  . acetaminophen (TYLENOL) tablet 650 mg  650 mg Oral Q6H PRN Cathlyn Parsons, PA-C   650 mg at 02/12/20 2100   Or  . acetaminophen (TYLENOL) suppository 650 mg  650 mg Rectal Q6H PRN Angiulli, Lavon Paganini, PA-C      . amiodarone (PACERONE) tablet 400 mg  400 mg Oral Daily Cathlyn Parsons, PA-C   400 mg at 02/13/20 5093  . apixaban (ELIQUIS) tablet 5 mg  5 mg Oral BID Cathlyn Parsons, PA-C   5 mg at 02/13/20 2671  . ascorbic acid (VITAMIN C) tablet 500 mg  500 mg Oral BID Cathlyn Parsons, PA-C   500 mg at 02/13/20 2458  . aspirin EC tablet 81 mg  81 mg Oral Daily Cathlyn Parsons, PA-C   81 mg at 02/13/20 0837  . atorvastatin (LIPITOR) tablet 40 mg  40 mg Oral QHS Cathlyn Parsons, PA-C   40 mg at 02/12/20 2100  . ceFEPIme (MAXIPIME) 2 g in sodium chloride 0.9 % 100 mL IVPB  2 g Intravenous Q24H Jamse Arn, MD   Stopped at 02/12/20 2100  . cholecalciferol (VITAMIN D3)  tablet 1,000 Units  1,000 Units Oral Daily Cathlyn Parsons, PA-C   1,000 Units at 02/13/20 2836  . DULoxetine (CYMBALTA) DR capsule 20 mg  20 mg Oral  Daily Cathlyn Parsons, PA-C   20 mg at 02/13/20 0837  . famotidine (PEPCID) tablet 20 mg  20 mg Oral Daily Cathlyn Parsons, PA-C   20 mg at 02/13/20 6294  . feeding supplement (NEPRO CARB STEADY) liquid 237 mL  237 mL Oral BID BM Angiulli, Lavon Paganini, PA-C 0 mL/hr at 02/12/20 2100 237 mL at 02/13/20 1057  . fluticasone (FLONASE) 50 MCG/ACT nasal spray 2 spray  2 spray Each Nare Daily Cathlyn Parsons, PA-C   2 spray at 02/13/20 0932  . gabapentin (NEURONTIN) capsule 200 mg  200 mg Oral TID Cathlyn Parsons, PA-C   200 mg at 02/13/20 7654  . haloperidol (HALDOL) tablet 1 mg  1 mg Oral BID Cathlyn Parsons, PA-C   1 mg at 02/13/20 6503  . insulin aspart (novoLOG) injection 0-15 Units  0-15 Units Subcutaneous TID AC & HS Cathlyn Parsons, PA-C   3 Units at 02/13/20 1226  . insulin aspart (novoLOG) injection 9 Units  9 Units Subcutaneous TID WC Jamse Arn, MD   9 Units at 02/13/20 1227  . insulin glargine (LANTUS) injection 14 Units  14 Units Subcutaneous Daily Raulkar, Clide Deutscher, MD   14 Units at 02/13/20 0834  . lidocaine (XYLOCAINE) 2 % jelly 1 application  1 application Urethral PRN Courtney Heys, MD   1 application at 54/65/68 1305  . liver oil-zinc oxide (DESITIN) 40 % ointment   Topical PRN Bary Leriche, PA-C   Given at 02/10/20 1813  . multivitamin with minerals tablet 1 tablet  1 tablet Oral Daily Cathlyn Parsons, PA-C   1 tablet at 02/13/20 1275  . nitroGLYCERIN (NITROSTAT) SL tablet 0.4 mg  0.4 mg Sublingual Q5 min PRN Angiulli, Lavon Paganini, PA-C      . OLANZapine zydis (ZYPREXA) disintegrating tablet 12.5 mg  12.5 mg Oral QHS AngiulliLavon Paganini, PA-C   12.5 mg at 02/12/20 2100  . polyethylene glycol (MIRALAX / GLYCOLAX) packet 17 g  17 g Oral BID Cathlyn Parsons, PA-C   17 g at 02/13/20 0836  . sorbitol 70 % solution 30 mL  30 mL Oral Daily PRN Lovorn, Megan, MD   30 mL at 02/09/20 1811  . tamsulosin (FLOMAX) capsule 0.8 mg  0.8 mg Oral Daily Lovorn, Megan, MD   0.8 mg  at 02/13/20 0837  . vitamin B-12 (CYANOCOBALAMIN) tablet 1,000 mcg  1,000 mcg Oral Daily Cathlyn Parsons, PA-C   1,000 mcg at 02/13/20 1700    Musculoskeletal: Strength & Muscle Tone: within normal limits Gait & Station: normal Patient leans: N/A  Psychiatric Specialty Exam: Physical Exam  Review of Systems  Blood pressure 116/70, pulse 85, temperature 98 F (36.7 C), temperature source Oral, resp. rate 20, weight 74.5 kg, SpO2 97 %.Body mass index is 24.25 kg/m.  General Appearance: Fairly Groomed  Eye Contact:  Fair  Speech:  Clear and Coherent and Normal Rate  Volume:  Normal  Mood:  Depressed  Affect:  Appropriate and Congruent  Thought Process:  Coherent, Linear and Descriptions of Associations: Intact  Orientation:  Full (Time, Place, and Person)  Thought Content:  WDL  Suicidal Thoughts:  No  Homicidal Thoughts:  No  Memory:  Immediate;   Fair Recent;   Fair  Judgement:  Fair  Insight:  Fair  Psychomotor Activity:  Normal  Concentration:  Concentration: Fair and Attention Span: Fair  Recall:  [employer] of Knowledge:  Fair  Language:  Fair  Akathisia:  No  Handed:  Right  AIMS (if indicated):     Assets:  Communication Skills Desire for Improvement Financial Resources/Insurance Dana Talents/Skills  ADL's:  Intact  Cognition:  WNL  Sleep:      The patient has been evaluated and determined to be psychiatrically stable by the provider. Patient has been assessed by the psych np and the findings have been discussed with this provider. Psychiatry was consulted to assist with psychiatric assessment and treatment/disposition planning. The chart has been reviewed and pertinent findings are noted below. Based on this review and assessment, the treatment plan has been created and discussed with the treatment team.   Treatment Plan Summary: Plan Continue current medications.  We will psychiatrically clear.  Will recommend  outpatient follow-up with psychiatry once he is medically stable for discharge.  continue current medications -psych to sign off  -work closely with social work to get him a referral to outpatient therapist for CBT.   Disposition: No evidence of imminent risk to self or others at present.   Patient does not meet criteria for psychiatric inpatient admission. Supportive therapy provided about ongoing stressors. Discussed crisis plan, support from social network, calling 911, coming to the Emergency Department, and calling Suicide Hotline.  Suella Broad, FNP 02/13/2020 1:13 PM

## 2020-02-13 NOTE — Progress Notes (Signed)
Occupational Therapy Session Note  Patient Details  Name: Jason Montes MRN: [id number] Date of Birth: [birth year]  Today's Date: 02/13/2020 OT Individual Time: 1300-1410 OT Individual Time Calculation (min): 70 min    Short Term Goals: Week 2:  OT Short Term Goal 1 (Week 2): Pt will perform toilet transfers with Min A OT Short Term Goal 2 (Week 2): Pt will perform LB dress with AE PRN with Min A OT Short Term Goal 3 (Week 2): Pt will don post surgical shoe and R regular shoe with Min A OT Short Term Goal 4 (Week 2): Pt will maintain standing with CGA for 2 mins to complete functional task  Skilled Therapeutic Interventions/Progress Updates:    Treatment session with focus on functional transfers, sit > stand, and standing balance and endurance.  Pt received supine in bed with nursing staff completing bladder scan.  Pt agreeable to therapy session, reporting feeling better this afternoon that previous sessions.  Pt able to recall having called his daughter on the phone today and pleased with that.  Pt completed bed mobility with CGA with bed rails.  Completed sit > stand from elevated EOB with min assist and completed stand pivot transfer with RW with min assist progressing to CGA during session.  Engaged in sit > stand from therapy mat while engaging in bean bag toss to bullseye to incorporate cognitive/memory task.  Pt able to complete all sit <> stand with CGA during activity with RW for stability once upright.  Pt required question cues for score keeping but able to complete addition and recall of previous score with min cues.  Pt requesting to terminate session due to fatigue and pain in Lt foot.  Returned to room and completed transfer back to bed with Min assist and cues for sequencing.  Engaged in boosting up in bed for improved positioning.  Notified RN of pt pain and request for tylenol.  Pt remained semi-reclined in bed with all needs in reach.  Therapy Documentation Precautions:   Precautions Precautions: Fall, Other (comment) Precaution Comments: LLE WBAT heel only with post-op shoe Required Braces or Orthoses: Other Brace Other Brace: postop shoe Restrictions Weight Bearing Restrictions: No RLE Weight Bearing: Weight bearing as tolerated LLE Weight Bearing: Weight bearing as tolerated LLE Partial Weight Bearing Percentage or Pounds:  (with ortho boot) General:   Vital Signs: Therapy Vitals Temp: 98 F (36.7 C) Temp Source: Oral Pulse Rate: 85 Resp: 20 BP: 116/70 Patient Position (if appropriate): Lying Oxygen Therapy SpO2: 97 % O2 Device: Room Air Pain: Pain Assessment Pain Scale: 0-10 Pain Score: 8  Pain Type: Neuropathic pain Pain Location: Foot Pain Orientation: Left Pain Descriptors / Indicators: Aching Pain Frequency: Intermittent Pain Intervention(s): Medication (See eMAR)   Therapy/Group: Individual Therapy  [name] 02/13/2020, 3:08 PM

## 2020-02-13 NOTE — Progress Notes (Signed)
Physical Therapy Session Note  Patient Details  Name: Jason Montes MRN: [id number] Date of Birth: 13-Jul-1950  Today's Date: 02/13/2020 PT Individual Time: 0900-0955 PT Individual Time Calculation (min): 55 min   Short Term Goals: Week 1:  PT Short Term Goal 1 (Week 1): Pt will perform supine<>sit with min assist PT Short Term Goal 1 - Progress (Week 1): Met PT Short Term Goal 2 (Week 1): Pt will perform sit<>stands using LRAD with min assist PT Short Term Goal 2 - Progress (Week 1): Progressing toward goal PT Short Term Goal 3 (Week 1): Pt will perform bed<>chair transfers with mod assist PT Short Term Goal 3 - Progress (Week 1): Met PT Short Term Goal 4 (Week 1): Pt will ambulate at least 23f using LRAD with mod assist PT Short Term Goal 4 - Progress (Week 1): Met Week 2:  PT Short Term Goal 1 (Week 2): pt will perform all bed mobility with CGA PT Short Term Goal 2 (Week 2): pt will transfer sit<>stand with LRAD and min A consistantly PT Short Term Goal 3 (Week 2): Pt will ambulate 219fwith LRAD CGA  Skilled Therapeutic Interventions/Progress Updates:   Received pt supine in bed, pt agreeable to therapy, and denied any pain during session. Session with emphasis on functional mobility/transfers, dressing, generalized strengthening, dynamic standing balance/coordination, ambulation, and improved activity tolerance. L post-op shoe donned during session. Donned R ted hose and non-skid sock total A and donned pants in supine with mod A. Pt rolled L and R with supervision and use of bedrails and able to pull pants over hips with cues for bridging. Pt transferred supine<>sitting EOB from flat bed with bedrails and mod A with cues for logroll technique. When asked where pt was pt stated " [facility] [facility] [employer] [facility] [place] [facility]." Therapist re-oriented pt to situation and location. Donned pull over shirt with min A and R shoe total A while sitting EOB. Pt with improvements in sitting  balance (distant supervision). Pt transferred sit<>stand with RW mod A with cues for upright posture and anterior weight shifting and stand<>pivot into WC mod A with cues to reach back prior to sitting. Pt washed face and brushed teeth sitting in WC at sink with supervision. Pt reported urge to use restroom and transferred WC<>toilet with bedside commode over top with RW mod A. Pt able to manage clothes with mod A and void. Pt washed hands seated in WC at sink with min A to reach soap dispenser and cues for visual scanning and sequencing. RN present to administer medications. [facility] purposes and ambulated 4720fx 2 trials with RW and min A. Pt demonstrated narrow BOS, decreased stride length, decreased trunk rotation, and flexed trunk and required cues for increased step length and upright posture. Pt transported back to room in WC [facility] A. Concluded session with pt sitting in WC, needs within reach, and seatbelt alarm on. Memory book updated.    Therapy Documentation Precautions:  Precautions Precautions: Fall, Other (comment) Precaution Comments: LLE WBAT heel only with post-op shoe Required Braces or Orthoses: Other Brace Other Brace: postop shoe Restrictions Weight Bearing Restrictions: No RLE Weight Bearing: Weight bearing as tolerated LLE Weight Bearing: Weight bearing as tolerated (Postop surgical shoe) LLE Partial Weight Bearing Percentage or Pounds:  (with ortho boot)  Therapy/Group: Individual Therapy AnnAlfonse Montes, DPT   02/13/2020, 7:22 AM

## 2020-02-13 NOTE — Progress Notes (Signed)
   02/13/20 1015  Clinical Encounter Type  Visited With Patient  Visit Type Initial  Referral From Nurse  Consult/Referral To Chaplain  Spoke with patient, [name] stated he is concerned that he is going backwards than forward. He described being able to walk with the assistance of a walker and now he is confined to the wheelchair. Patient said he is working hard with physical therapy and believe he will be able to walk again. Advised patient chaplain available when he feel the need to talk. This note was prepared by Jeanine Luz.  For questions please contact by phone [phone]

## 2020-02-14 ENCOUNTER — Other Ambulatory Visit: Payer: Self-pay

## 2020-02-14 ENCOUNTER — Inpatient Hospital Stay (HOSPITAL_COMMUNITY): Payer: Medicare Other

## 2020-02-14 ENCOUNTER — Inpatient Hospital Stay (HOSPITAL_COMMUNITY): Payer: Medicare Other | Admitting: Occupational Therapy

## 2020-02-14 LAB — GLUCOSE, CAPILLARY
Glucose-Capillary: 145 mg/dL — ABNORMAL HIGH (ref 70–99)
Glucose-Capillary: 418 mg/dL — ABNORMAL HIGH (ref 70–99)
Glucose-Capillary: 116 mg/dL — ABNORMAL HIGH (ref 70–99)
Glucose-Capillary: 128 mg/dL — ABNORMAL HIGH (ref 70–99)

## 2020-02-14 LAB — GLUCOSE, RANDOM: Glucose, Bld: 397 mg/dL — ABNORMAL HIGH (ref 70–99)

## 2020-02-14 MED ORDER — INSULIN ASPART 100 UNIT/ML ~~LOC~~ SOLN
11.0000 [IU] | Freq: Three times a day (TID) | SUBCUTANEOUS | Status: DC
  Administered 2020-02-14 – 2020-02-27 (×42): 11 [IU] via SUBCUTANEOUS

## 2020-02-14 MED ORDER — INFLUENZA VAC A&B SA ADJ QUAD 0.5 ML IM PRSY
0.5000 mL | PREFILLED_SYRINGE | INTRAMUSCULAR | Status: AC
  Administered 2020-02-15: 0.5 mL via INTRAMUSCULAR
  Filled 2020-02-14: qty 0.5

## 2020-02-14 NOTE — Progress Notes (Signed)
[place] PHYSICAL MEDICINE & REHABILITATION PROGRESS NOTE   Subjective/Complaints: Patient seen sitting up in his chair working with therapy this morning. He states he slept well overnight. He states he has a question, but cannot recall. He states he feels better and is working hard in therapies again. Discussed antibiotics with patient.  ROS: Denies CP, SOB, N/V/D  Objective:   No results found. Recent Labs    02/12/20 0550 02/13/20 0728  WBC 8.3 8.5  HGB 8.9* 9.2*  HCT 28.1* 29.7*  PLT PLATELET CLUMPING, SUGGEST RECOLLECTION OF SAMPLE IN CITRATE TUBE. PLATELET CLUMPING, SUGGEST RECOLLECTION OF SAMPLE IN CITRATE TUBE.   Recent Labs    02/13/20 0728  NA 133*  K 4.1  CL 101  CO2 24  GLUCOSE 231*  BUN 20  CREATININE 1.34*  CALCIUM 8.8*    Intake/Output Summary (Last 24 hours) at 02/14/2020 1027 Last data filed at 02/14/2020 0804 Gross per 24 hour  Intake 1364 ml  Output 2091 ml  Net -727 ml     Physical Exam: Vital Signs Blood pressure (!) 126/53, pulse 85, temperature 97.6 F (36.4 C), resp. rate 17, weight 73.6 kg, SpO2 93 %. Constitutional: No distress . Vital signs reviewed. HENT: Normocephalic.  Atraumatic. Eyes: EOMI. No discharge. Cardiovascular: No JVD.  RRR. Respiratory: Normal effort.  No stridor.  Bilateral clear to auscultation. GI: Non-distended.  BS +. Skin: Left foot with dressing CDI Psych: Normal mood.  Normal behavior. Musc: Left great toe with edema and tenderness, improving Neuro: Alert Motor: 4+/5 throughout, except left foot, stable Bilateral upper extremity resting tremors, improved  Assessment/Plan: 1. Functional deficits secondary to acute metabolic encephalopathy/drug overdose and hx of multiple CVAs which require 3+ hours per day of interdisciplinary therapy in a comprehensive inpatient rehab setting.  Physiatrist is providing close team supervision and 24 hour management of active medical problems listed below.  Physiatrist and  rehab team continue to assess barriers to discharge/monitor patient progress toward functional and medical goals  Care Tool:  Bathing    Body parts bathed by patient: Right arm, Left arm, Chest, Abdomen, Right upper leg, Left upper leg, Face, Front perineal area   Body parts bathed by helper: Right lower leg, Buttocks Body parts n/a: Left lower leg   Bathing assist Assist Level: Minimal Assistance - Patient > 75%     Upper Body Dressing/Undressing Upper body dressing   What is the patient wearing?: Pull over shirt    Upper body assist Assist Level: Set up assist    Lower Body Dressing/Undressing Lower body dressing      What is the patient wearing?: Pants, Incontinence brief     Lower body assist Assist for lower body dressing: Maximal Assistance - Patient 25 - 49%     Toileting Toileting    Toileting assist Assist for toileting: Dependent - Patient 0%     Transfers Chair/bed transfer  Transfers assist     Chair/bed transfer assist level: Minimal Assistance - Patient > 75%     Locomotion Ambulation   Ambulation assist   Ambulation activity did not occur: Safety/medical concerns  Assist level: Minimal Assistance - Patient > 75% Assistive device: Walker-rolling Max distance: 14ft   Walk 10 feet activity   Assist  Walk 10 feet activity did not occur: Safety/medical concerns  Assist level: Minimal Assistance - Patient > 75% Assistive device: Walker-rolling   Walk 50 feet activity   Assist Walk 50 feet with 2 turns activity did not occur: Safety/medical concerns  Assist level: Minimal Assistance - Patient > 75% Assistive device: Walker-rolling    Walk 150 feet activity   Assist Walk 150 feet activity did not occur: Safety/medical concerns         Walk 10 feet on uneven surface  activity   Assist Walk 10 feet on uneven surfaces activity did not occur: Safety/medical concerns         Wheelchair     Assist Will patient use  wheelchair at discharge?: No Type of Wheelchair: Manual    Wheelchair assist level: Supervision/Verbal cueing Max wheelchair distance: 34ft    Wheelchair 50 feet with 2 turns activity    Assist        Assist Level: Supervision/Verbal cueing   Wheelchair 150 feet activity     Assist      Assist Level: Maximal Assistance - Patient 25 - 49%   Blood pressure (!) 126/53, pulse 85, temperature 97.6 F (36.4 C), resp. rate 17, weight 73.6 kg, SpO2 93 %.  Medical Problem List and Plan: 1.  Altered mental status secondary to acute metabolic encephalopathy related to possible drug overdose as well as history of multiple CVAs/vascular dementia  Continue CIR  DC sutures 2.  Antithrombotics: -DVT/anticoagulation: Eliquis             -antiplatelet therapy: Aspirin 81 mg daily, Plavix 75 mg daily 3. Pain Management: Neurontin 200 mg 3 times daily, Cymbalta 20 mg daily.   Controlled 9/14 4. Mood: Provide emotional support             -antipsychotic agents: Haldol 1 mg BID, Zyprexa 12.5 mg nightly  He was seen by psychiatry yesterday, notes reviewed-continue supportive care. 5. Neuropsych: This patient is not fully capable of making decisions on his own behalf. 6. Skin/Wound Care: Routine skin checks  Continue Prevalon boot for left heel 7. Fluids/Electrolytes/Nutrition: Routine in and outs with follow-up chemistries 8.  Atrial fibrillation.  Amiodarone 400 mg daily.  Controlled on 9/14 9.  Chronic left hallux osteomyelitis.  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 Caroline More.  Weightbearing as tolerated through left heel only with postoperative shoe  Needs cues to remember WB precautions 10.  Hypertension.  Monitor with increased mobility.   Controlled on 9/14 11.  Diabetes mellitus with peripheral neuropathy.  Hemoglobin A1c 7.9.    NovoLog 6 units 3 times daily with meals, increased to 9 3 times daily on 9/9, increased to 11 3 times daily on 9/14  Diabetic  teaching.   Increased Lantus to 14U on 9/7.   CBG (last 3)  Recent Labs    02/13/20 1642 02/13/20 2107 02/14/20 0628  GLUCAP 244* 163* 145*  12.  CKD stage III as well as bouts of hyperkalemia.  Admission creatinine 1.59.  Continue Lokelma 5 mg twice daily.  Creatinine 1.34 on 9/13  Encourage fluids 13.  Hyperlipidemia.  Lipitor 14.  CAD with history of pacemaker.  Continue aspirin 15.  Chronic systolic congestive heart failure.  Latest ejection fraction 40 to 45%.  Monitor for any signs of fluid overload   Filed Weights   02/12/20 0623 02/13/20 0515 02/14/20 0358  Weight: 73.2 kg 74.5 kg 73.6 kg   Stable on 9/14 16.  Urinary retention.    Increased Flomax to 0.8 mg daily.  17. Orthostatic hypotension: abdominal binder and TEDs ordered  Improving 18.  Thrombocytopenia  Platelets 64 on 9/10 repeat with clumps, labs ordered for tomorrow  Discussed with pharmacy - no obvious offending agents  Will monitor closely and may need to adjust anticoagulation due to increased risk of bleeding  Continue to monitor 19.  Chronic hyponatremia  Sodium 133 on 9/14.  20. UTI  Dysuria with foul odor  WBCs within normal limits on 9/13.  UA +, UC + for >100,000 proteus mirabilis, susceptibilities pending.  Continue Keflex started on 9/11  21. Acute blood loss anemia  Hemoglobin 9.2 on 9/13 22. Constipation: prune juice with meals.  23. Dry mouth: ordered ice chips  LOS: 11 days A FACE TO FACE EVALUATION WAS PERFORMED  Jovon Winterhalter Lorie Phenix 02/14/2020, 10:27 AM

## 2020-02-14 NOTE — Significant Event (Signed)
BS >400 Dan notified order for stat glucose ordered. Pt has no sx

## 2020-02-14 NOTE — Progress Notes (Signed)
Patient voided at 03:50 for 320ml, then voided again for 260ml ten minutes later. Patient still retaining for a PVR of 682ml. This nurse had to cath patient for an output of 848ml. Urine yellow/clear. No signs of distress. Patient tolerated well.

## 2020-02-14 NOTE — Progress Notes (Signed)
Occupational Therapy Session Note  Patient Details  Name: Jason Montes MRN: [id number] Date of Birth: [birth year]  Today's Date: 02/14/2020 OT Individual Time: 1352-1500 OT Individual Time Calculation (min): 68 min    Short Term Goals: Week 2:  OT Short Term Goal 1 (Week 2): Pt will perform toilet transfers with Min A OT Short Term Goal 2 (Week 2): Pt will perform LB dress with AE PRN with Min A OT Short Term Goal 3 (Week 2): Pt will don post surgical shoe and R regular shoe with Min A OT Short Term Goal 4 (Week 2): Pt will maintain standing with CGA for 2 mins to complete functional task  Skilled Therapeutic Interventions/Progress Updates:    Treatment session with focus on self-care retraining, functional transfers, and problem solving LB bathing and dressing.  Pt received upright in w/c demonstrating increased recall of specifics about previous therapy sessions.  Pt expressing desire to shower this session.  Completed stand pivot transfer w/c <> tub bench in room shower with Min assist and use of grab bar for stability.  Pt completed bathing at overall supervision level, CGA and cues for sequencing and problem solving washing buttocks from seated level due to WB precautions in LLE and wet floor.  Therapist donned post op shoe and Rt shoe prior to transferring out of shower.  Engaged in dressing at sit > stand at sink.  Pt continues to demonstrate difficulty with LB dressing, requiring assistance to thread BLE in to pants but able to complete sit > stand with CGA and pull pants over hips while standing with CGA.  Pt completed grooming tasks seated at sink.  Pt remained upright in w/c with seat belt alarm on and all needs in reach.  Therapy Documentation Precautions:  Precautions Precautions: Fall, Other (comment) Precaution Comments: LLE WBAT heel only with post-op shoe Required Braces or Orthoses: Other Brace Other Brace: postop shoe Restrictions Weight Bearing Restrictions:  No RLE Weight Bearing: Weight bearing as tolerated LLE Weight Bearing: Weight bearing as tolerated LLE Partial Weight Bearing Percentage or Pounds:  (with ortho boot) General:   Vital Signs: Therapy Vitals Temp: 97.8 F (36.6 C) Temp Source: Oral Pulse Rate: 80 Resp: 17 BP: 116/65 Patient Position (if appropriate): Sitting Oxygen Therapy SpO2: 97 % O2 Device: Room Air Pain: Pain Assessment Pain Score: 0-No pain   Therapy/Group: Individual Therapy  [name] 02/14/2020, 3:26 PM

## 2020-02-14 NOTE — Progress Notes (Signed)
Physical Therapy Session Note  Patient Details  Name: Jason Montes MRN: [id number] Date of Birth: 23-Jan-1951  Today's Date: 02/14/2020 PT Individual Time: 0800-0910 PT Individual Time Calculation (min): 70 min   Short Term Goals: Week 1:  PT Short Term Goal 1 (Week 1): Pt will perform supine<>sit with min assist PT Short Term Goal 1 - Progress (Week 1): Met PT Short Term Goal 2 (Week 1): Pt will perform sit<>stands using LRAD with min assist PT Short Term Goal 2 - Progress (Week 1): Progressing toward goal PT Short Term Goal 3 (Week 1): Pt will perform bed<>chair transfers with mod assist PT Short Term Goal 3 - Progress (Week 1): Met PT Short Term Goal 4 (Week 1): Pt will ambulate at least 30ft using LRAD with mod assist PT Short Term Goal 4 - Progress (Week 1): Met Week 2:  PT Short Term Goal 1 (Week 2): pt will perform all bed mobility with CGA PT Short Term Goal 2 (Week 2): pt will transfer sit<>stand with LRAD and min A consistantly PT Short Term Goal 3 (Week 2): Pt will ambulate 66ft with LRAD CGA  Skilled Therapeutic Interventions/Progress Updates:   Received pt supine in bed with RN present administering medications, pt agreeable to therapy, and denied any pain at start of session but reported increased L heel pain with mobility. Rest breaks given to reduce pain. Session with emphasis on functional mobility/transfers, dressing, generalized strengthening, dynamic standing balance/coordination, ambulation, stair navigation, and improved activity tolerance. Donned R ted hose and non-skid sock total A and donned pants in supine with min A. Pt initially stating "I can't" when trying to pull pants up but with cues for hooklying position and bridging pt able to pull pants over hips with CGA. Supine<>sitting EOB with min A from flat bed with use of bedrails and cues for logroll technique. Donned L post-op shoe and R regular shoe total A. Donned pull over shirt with CGA. Pt transferred  bed<>WC stand<>pivot with RW and min A with cues to reach back for armrests prior to sitting. Pt with questions regarding using cane, however therapist encouraged pt to use RW for safety, stability, balance, and energy conservation at this time. Pt brushed teeth, washed face, and applied deodorant sitting in WC at sink with supervision. Pt able to recall day of the week and month but when asked location pt stated "we're in [employer] of this place, I don't know what to call it". Therapist reoriented pt. Pt performed WC mobility 68ft using bilateral UEs and supervision with cues to avoid running into obstacles on R side and to increase stride. Pt stopped due to increased frustration propelling WC and transported remainder of way to therapy gym total A. Pt navigated 4 steps with 2 rails mod A ascending and descending with a step to pattern. Pt required verbal cues for turning technique and for anterior weight shifting as pt leaning posteriorly throughout. Pt ambulated 13ft with RW min A. Pt required cues for upright posture, to increase step length, and for visual scanning to R as pt running into obstacles. Pt required encouragement to complete session stating "well can't we just sit here and rest" despite multiple extended rest breaks therapist provided. MD present for morning rounds and discussed removing sutures from L LE. Pt transferred WC<>Nustep with RW and min A and performed bilateral UE/LE strengthening on Nustep at workload 2 for 5 minutes and 58 seconds for total of 238 steps for improved cardiovascular endurance. Pt required max  A t place LEs on footplates. Stand<>pivot Nustep<>WC with RW min A. Pt reported 5/10 fatigue at end of session and transported back to room in [facility] total A. Concluded session with pt sitting in WC, needs within reach, and seatbelt alarm on. Memory book updated and therapist provided fresh drink to pt.   Therapy Documentation Precautions:  Precautions Precautions: Fall, Other  (comment) Precaution Comments: LLE WBAT heel only with post-op shoe Required Braces or Orthoses: Other Brace Other Brace: postop shoe Restrictions Weight Bearing Restrictions: No RLE Weight Bearing: Weight bearing as tolerated LLE Weight Bearing: Weight bearing as tolerated LLE Partial Weight Bearing Percentage or Pounds:  (with ortho boot)  Therapy/Group: Individual Therapy [name] PT, DPT   02/14/2020, 7:19 AM

## 2020-02-14 NOTE — Progress Notes (Signed)
Pt is on D4 of total abx for his proteus UTI. Ok to add stop date of 5d per Dr. Posey Pronto.  Onnie Boer, PharmD, BCIDP, AAHIVP, CPP Infectious Disease Pharmacist 02/14/2020 9:31 AM

## 2020-02-14 NOTE — Progress Notes (Signed)
Speech Language Pathology Daily Session Note  Patient Details  Name: Jason Montes MRN: [id number] Date of Birth: Dec 03, 1950  Today's Date: 02/14/2020 SLP Individual Time: 1000-1100 SLP Individual Time Calculation (min): 60 min  Short Term Goals: Week 2: SLP Short Term Goal 1 (Week 2): Patient will recall basic, daily information with min A verbal cues for use of memory compensatory strategies and extrenal aids. SLP Short Term Goal 2 (Week 2): Patient will demonstrate functional problem solving for basic and familiar tasks with supervison verbal cues. SLP Short Term Goal 3 (Week 2): Patient will self-monitor and correct errors during functional tasks with supervision verbal cues.  Skilled Therapeutic Interventions:Skilled ST services focused on cognitive skills. Pt recalled am PT events with Min A verbal cues without use of visual aids. Pt required extra time and mod A fade to min a verbal cues to read memory notebook, likely due to baseline reading deficits. SLP facilitated basic to semi-complex problem solving and error awareness in novel PEG design task, pt required min A verbal cues fade to mod I.  SLP assisted pt into bed with RW and pt required min A verbal cues for safety awareness. Pt was left in room with call bell within reach and bed alarm set. SLP recommends to continue skilled services.     Pain Pain Assessment Pain Scale: 0-10 Pain Score: 0-No pain Pain Type: Acute pain Pain Location: Foot Pain Orientation: Right Pain Descriptors / Indicators: Aching Pain Frequency: Intermittent Pain Onset: On-going Patients Stated Pain Goal: 2 Pain Intervention(s): Medication (See eMAR)  Therapy/Group: Individual Therapy  Sherif Millspaugh  [facility] 02/14/2020, 11:54 AM

## 2020-02-15 ENCOUNTER — Inpatient Hospital Stay (HOSPITAL_COMMUNITY): Payer: Medicare Other

## 2020-02-15 ENCOUNTER — Inpatient Hospital Stay (HOSPITAL_COMMUNITY): Payer: Medicare Other | Admitting: Speech Pathology

## 2020-02-15 ENCOUNTER — Inpatient Hospital Stay (HOSPITAL_COMMUNITY): Payer: Medicare Other | Admitting: Occupational Therapy

## 2020-02-15 DIAGNOSIS — L89622 Pressure ulcer of left heel, stage 2: Secondary | ICD-10-CM

## 2020-02-15 LAB — CBC WITH DIFFERENTIAL/PLATELET
Abs Immature Granulocytes: 0.34 10*3/uL — ABNORMAL HIGH (ref 0.00–0.07)
Basophils Absolute: 0.1 10*3/uL (ref 0.0–0.1)
Basophils Relative: 1 %
Eosinophils Absolute: 0.5 10*3/uL (ref 0.0–0.5)
Eosinophils Relative: 5 %
HCT: 28.1 % — ABNORMAL LOW (ref 39.0–52.0)
Hemoglobin: 8.6 g/dL — ABNORMAL LOW (ref 13.0–17.0)
Immature Granulocytes: 3 %
Lymphocytes Relative: 9 %
Lymphs Abs: 0.9 10*3/uL (ref 0.7–4.0)
MCH: 27.4 pg (ref 26.0–34.0)
MCHC: 30.6 g/dL (ref 30.0–36.0)
MCV: 89.5 fL (ref 80.0–100.0)
Monocytes Absolute: 0.8 10*3/uL (ref 0.1–1.0)
Monocytes Relative: 8 %
Neutro Abs: 7.4 10*3/uL (ref 1.7–7.7)
Neutrophils Relative %: 74 %
Platelets: UNDETERMINED 10*3/uL (ref 150–400)
RBC: 3.14 MIL/uL — ABNORMAL LOW (ref 4.22–5.81)
RDW: 16.6 % — ABNORMAL HIGH (ref 11.5–15.5)
WBC: 9.9 10*3/uL (ref 4.0–10.5)
nRBC: 0 % (ref 0.0–0.2)

## 2020-02-15 LAB — GLUCOSE, CAPILLARY
Glucose-Capillary: 124 mg/dL — ABNORMAL HIGH (ref 70–99)
Glucose-Capillary: 195 mg/dL — ABNORMAL HIGH (ref 70–99)
Glucose-Capillary: 233 mg/dL — ABNORMAL HIGH (ref 70–99)
Glucose-Capillary: 117 mg/dL — ABNORMAL HIGH (ref 70–99)

## 2020-02-15 NOTE — Progress Notes (Signed)
Physical Therapy Weekly Progress Note  Patient Details  Name: Jason Montes MRN: [id number] Date of Birth: May 02, 1951  Beginning of progress report period: February 04, 2020 End of progress report period: February 15, 2020  Patient has met 2 of 3 short term goals. Pt demonstrates gradual progress towards long term goals. Pt currently requires CGA-min A for supine<>sit, min A for transfers with RW, CGA/min A for gait up to 122f with RW, mod A to navigate 4 steps with 2 rails, and supervision for WC mobility up to 825f However, pt continues to be limited by cognitive and memory deficits, generalized weakness, pain in L heel, decreased endurance and balance, and poor safety awareness.   Patient continues to demonstrate the following deficits muscle weakness, decreased awareness, decreased problem solving, decreased safety awareness and decreased memory and decreased sitting balance, decreased standing balance, decreased postural control, decreased balance strategies and difficulty maintaining precautions and therefore will continue to benefit from skilled PT intervention to increase functional independence with mobility.  Patient progressing toward long term goals..  Continue plan of care.  PT Short Term Goals Week 2:  PT Short Term Goal 1 (Week 2): pt will perform all bed mobility with CGA PT Short Term Goal 1 - Progress (Week 2): Progressing toward goal PT Short Term Goal 2 (Week 2): pt will transfer sit<>stand with LRAD and min A consistantly PT Short Term Goal 2 - Progress (Week 2): Met PT Short Term Goal 3 (Week 2): Pt will ambulate 2543fith LRAD CGA PT Short Term Goal 3 - Progress (Week 2): Met Week 3:  PT Short Term Goal 1 (Week 3): pt will perform all bed mobility with CGA consistantly PT Short Term Goal 2 (Week 3): pt will transfer sit<>stand with LRAD and CGA consistantly PT Short Term Goal 3 (Week 3): Pt will ambulate 100f46fth LRAD CGA  Skilled Therapeutic  Interventions/Progress Updates:  Ambulation/gait training;Cognitive remediation/compensation;Discharge planning;DME/adaptive equipment instruction;Functional mobility training;Pain management;Psychosocial support;Splinting/orthotics;Therapeutic Activities;UE/LE Strength taining/ROM;Visual/perceptual remediation/compensation;Balance/vestibular training;Community reintegration;Disease management/prevention;Functional electrical stimulation;Neuromuscular re-education;Patient/family education;Skin care/wound management;Stair training;Therapeutic Exercise;UE/LE Coordination activities;Wheelchair propulsion/positioning   Therapy Documentation Precautions:  Precautions Precautions: Fall, Other (comment) Precaution Comments: LLE WBAT heel only with post-op shoe Required Braces or Orthoses: Other Brace Other Brace: postop shoe Restrictions Weight Bearing Restrictions: No RLE Weight Bearing: Weight bearing as tolerated LLE Weight Bearing: Weight bearing as tolerated LLE Partial Weight Bearing Percentage or Pounds:  (with ortho boot)  Therapy/Group: Individual Therapy AnnaAlfonse Alpers DPT   02/15/2020, 7:21 AM

## 2020-02-15 NOTE — Progress Notes (Signed)
Orthopedic Tech Progress Note Patient Details:  Jason Montes 18-Feb-1951 [id number] Called in order to [facility] for a REHAB [facility]FO Patient ID: Jason Montes, male   DOB: 1951-03-17, 69 y.o.   MRN: [id number]   Janit Pagan 02/15/2020, 9:25 AM

## 2020-02-15 NOTE — Progress Notes (Signed)
Patient ID: Jason Montes, male   DOB: [birth year], 69 y.o.   MRN: [id number]  Met with pt to discuss team conference progress toward his goals and discharge still 9/26. He feels better than he did last week and is hopeful he will feel even better next week. He is aware the therapy team recommends 24 hr supervision but will not have this. He will have PCS 1-2 hrs per day and hired friend who lives at the house checking on him and getting his groceries. Asked to call daughter or son in-law and pt declined and said he would talk with them. Continue to work on discharge needs.

## 2020-02-15 NOTE — Progress Notes (Signed)
Speech Language Pathology Daily Session Note  Patient Details  Name: Jason Montes MRN: [id number] Date of Birth: 01/24/1951  Today's Date: 02/15/2020 SLP Individual Time: 0900-1000 SLP Individual Time Calculation (min): 60 min  Short Term Goals: Week 2: SLP Short Term Goal 1 (Week 2): Patient will recall basic, daily information with min A verbal cues for use of memory compensatory strategies and extrenal aids. SLP Short Term Goal 2 (Week 2): Patient will demonstrate functional problem solving for basic and familiar tasks with supervison verbal cues. SLP Short Term Goal 3 (Week 2): Patient will self-monitor and correct errors during functional tasks with supervision verbal cues.  Skilled Therapeutic Interventions:   Patient was able to recall and discuss therapy session with PT and OT from yesterday when SLP cued him in use of memory book with minA overall once initiated. When completing a task of describing an object without using its name, he had a lot of difficulty and told SLP that from his previous stroke, he has trouble with his expressive language and also reading. He said that "Ive worked back to a 6th grade level" in terms of his language abilities. He was able to successfully describe one object with SLP providing minA semantic and question cues. Patient recalled portions of video he and SLP watched and answered summary based questions with 70% accuracy and modA. Patient continues to benefit from skilled SLP intervention to maximize cognitive-linguistic function prior to discharge.  Pain Pain Assessment Pain Scale: 0-10 Pain Score: 0-No pain  Therapy/Group: Individual Therapy   Sonia Baller, MA, CCC-SLP Speech Therapy

## 2020-02-15 NOTE — Progress Notes (Signed)
[facility] PHYSICAL MEDICINE & REHABILITATION PROGRESS NOTE   Subjective/Complaints: Patient seen ambulating and later sitting with therapy this morning.  He states he slept well overnight.  He notes he still has some heel pain.  Discussed progression of the last 2 days with therapies.  ROS: Denies CP, SOB, N/V/D  Objective:   No results found. Recent Labs    02/13/20 0728  WBC 8.5  HGB 9.2*  HCT 29.7*  PLT PLATELET CLUMPING, SUGGEST RECOLLECTION OF SAMPLE IN CITRATE TUBE.   Recent Labs    02/13/20 0728 02/14/20 1157  NA 133*  --   K 4.1  --   CL 101  --   CO2 24  --   GLUCOSE 231* 397*  BUN 20  --   CREATININE 1.34*  --   CALCIUM 8.8*  --     Intake/Output Summary (Last 24 hours) at 02/15/2020 0908 Last data filed at 02/15/2020 0735 Gross per 24 hour  Intake 840 ml  Output 1000 ml  Net -160 ml     Physical Exam: Vital Signs Blood pressure (!) 112/55, pulse 80, temperature 97.9 F (36.6 C), temperature source Oral, resp. rate 16, weight 74.2 kg, SpO2 93 %. Constitutional: No distress . Vital signs reviewed. HENT: Normocephalic.  Atraumatic. Eyes: EOMI. No discharge. Cardiovascular: No JVD.  RRR. Respiratory: Normal effort.  No stridor.  Bilateral clear to auscultation. GI: Non-distended.  BS +. Skin: Left foot with serosanguineous drainage along medial aspect of wound Left heel with open ulcer, stage II Psych: Normal mood.  Normal behavior. Musc: Left foot with edema and tenderness Left lower extremity first digit amputation Neuro: Alert Motor: 4+/5 throughout, except left foot, unchanged Bilateral upper extremity resting tremors, improved  Assessment/Plan: 1. Functional deficits secondary to acute metabolic encephalopathy/drug overdose and hx of multiple CVAs which require 3+ hours per day of interdisciplinary therapy in a comprehensive inpatient rehab setting.  Physiatrist is providing close team supervision and 24 hour management of active medical  problems listed below.  Physiatrist and rehab team continue to assess barriers to discharge/monitor patient progress toward functional and medical goals  Care Tool:  Bathing    Body parts bathed by patient: Right arm, Left arm, Chest, Abdomen, Right upper leg, Left upper leg, Face, Front perineal area, Buttocks, Right lower leg   Body parts bathed by helper: Right lower leg, Buttocks Body parts n/a: Left lower leg   Bathing assist Assist Level: Contact Guard/Touching assist     Upper Body Dressing/Undressing Upper body dressing   What is the patient wearing?: Pull over shirt    Upper body assist Assist Level: Set up assist    Lower Body Dressing/Undressing Lower body dressing      What is the patient wearing?: Pants, Incontinence brief     Lower body assist Assist for lower body dressing: Moderate Assistance - Patient 50 - 74%     Toileting Toileting    Toileting assist Assist for toileting: Dependent - Patient 0%     Transfers Chair/bed transfer  Transfers assist     Chair/bed transfer assist level: Minimal Assistance - Patient > 75%     Locomotion Ambulation   Ambulation assist   Ambulation activity did not occur: Safety/medical concerns  Assist level: Contact Guard/Touching assist Assistive device: Walker-rolling Max distance: 55ft   Walk 10 feet activity   Assist  Walk 10 feet activity did not occur: Safety/medical concerns  Assist level: Contact Guard/Touching assist Assistive device: Walker-rolling   Walk 50 feet  activity   Assist Walk 50 feet with 2 turns activity did not occur: Safety/medical concerns  Assist level: Contact Guard/Touching assist Assistive device: Walker-rolling    Walk 150 feet activity   Assist Walk 150 feet activity did not occur: Safety/medical concerns         Walk 10 feet on uneven surface  activity   Assist Walk 10 feet on uneven surfaces activity did not occur: Safety/medical concerns          Wheelchair     Assist Will patient use wheelchair at discharge?: No Type of Wheelchair: Manual    Wheelchair assist level: Supervision/Verbal cueing Max wheelchair distance: 68ft    Wheelchair 50 feet with 2 turns activity    Assist        Assist Level: Supervision/Verbal cueing   Wheelchair 150 feet activity     Assist      Assist Level: Maximal Assistance - Patient 25 - 49%   Blood pressure (!) 112/55, pulse 80, temperature 97.9 F (36.6 C), temperature source Oral, resp. rate 16, weight 74.2 kg, SpO2 93 %.  Medical Problem List and Plan: 1.  Altered mental status secondary to acute metabolic encephalopathy related to possible drug overdose as well as history of multiple CVAs/vascular dementia  Continue CIR  Team conference today to discuss current and goals and coordination of care, home and environmental barriers, and discharge planning with nursing, case manager, and therapies. Please see conference note from today as well.  2.  Antithrombotics: -DVT/anticoagulation: Eliquis             -antiplatelet therapy: Aspirin 81 mg daily, Plavix 75 mg daily 3. Pain Management: Neurontin 200 mg 3 times daily, Cymbalta 20 mg daily.   Relatively controlled with meds on 9/16 4. Mood: Provide emotional support             -antipsychotic agents: Haldol 1 mg BID, Zyprexa 12.5 mg nightly  He was seen by psychiatry yesterday, notes reviewed-continue supportive care. 5. Neuropsych: This patient is not fully capable of making decisions on his own behalf. 6. Skin/Wound Care: Routine skin checks  Will DC Prevalon boot and order PRAFO 7. Fluids/Electrolytes/Nutrition: Routine in and outs with follow-up chemistries 8.  Atrial fibrillation.  Amiodarone 400 mg daily.  Controlled on 9/15 9.  Chronic left hallux osteomyelitis.  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 Caroline More.  Weightbearing as tolerated through left heel only with postoperative shoe  Needs cues  to remember WB precautions 10.  Hypertension.  Monitor with increased mobility.   Controlled on 9/15 11.  Diabetes mellitus with peripheral neuropathy.  Hemoglobin A1c 7.9.    NovoLog 6 units 3 times daily with meals, increased to 9 3 times daily on 9/9, increased to 11 3 times daily on 9/14  Diabetic teaching.   Increased Lantus to 14U on 9/7.   CBG (last 3)  Recent Labs    02/14/20 1659 02/14/20 2111 02/15/20 0610  GLUCAP 116* 128* 124*   Extremely labile on 9/15, monitor for trend 12.  CKD stage III as well as bouts of hyperkalemia.  Admission creatinine 1.59.  Continue Lokelma 5 mg twice daily.  Creatinine 1.34 on 9/13  Encourage fluids 13.  Hyperlipidemia.  Lipitor 14.  CAD with history of pacemaker.  Continue aspirin 15.  Chronic systolic congestive heart failure.  Latest ejection fraction 40 to 45%.  Monitor for any signs of fluid overload   Filed Weights   02/13/20 0515 02/14/20 0358 02/15/20 0424  Weight: 74.5 kg 73.6 kg 74.2 kg   Stable on 9/15 16.  Urinary retention.    Increased Flomax to 0.8 mg daily.  17. Orthostatic hypotension: abdominal binder and TEDs ordered  Improving 18.  Thrombocytopenia  Platelets 64 on 9/10 repeat with clumps, labs pending  Discussed with pharmacy - no obvious offending agents  Will monitor closely and may need to adjust anticoagulation due to increased risk of bleeding  Continue to monitor 19.  Chronic hyponatremia  Sodium 133 on 9/14.,  Plan order labs in the week 20. UTI  Dysuria with foul odor  WBCs within normal limits on 9/13.  UA +, UC + for >100,000 proteus mirabilis  Continue Keflex started on 9/11  21. Acute blood loss anemia  Hemoglobin 9.2 on 9/13 22. Constipation: prune juice with meals.  23. Dry mouth: ordered ice chips  LOS: 12 days A FACE TO FACE EVALUATION WAS PERFORMED  Jason Montes Lorie Phenix 02/15/2020, 9:08 AM

## 2020-02-15 NOTE — Patient Care Conference (Signed)
Inpatient RehabilitationTeam Conference and Plan of Care Update Date: 02/15/2020   Time: 11:42 AM    Patient Name: Jason Montes      Medical Record Number: [id number]  Date of Birth: September 16, 1950 Sex: Male         Room/Bed: 4M04C/4M04C-01 Payor Info: Payor: MEDICARE / Plan: MEDICARE PART A AND B / Product Type: *No Product type* /    Admit Date/Time:  02/03/2020  1:10 PM  Primary Diagnosis:  Acute metabolic encephalopathy  Hospital Problems: Principal Problem:   Acute metabolic encephalopathy Active Problems:   Post-operative pain   Benign essential HTN   Diabetic peripheral neuropathy (HCC)   Labile blood glucose   Chronic systolic congestive heart failure ([place])   Urinary retention   Thrombocytopenia ([place])   Hyponatremia   Acute blood loss anemia   Acute lower UTI   Suicidal ideation    Expected Discharge Date: Expected Discharge Date: 02/26/20  Team Members Present: Physician leading conference: Dr. Delice Lesch Care Coodinator Present: Dorien Chihuahua, RN, BSN, CRRN;Other (comment) Jacqlyn Larsen [place], SW) Nurse Present: Other (comment) Abigail Miyamoto, RN) PT Present: Becky Sax, PT OT Present: [name], OT PPS Coordinator present : Gunnar Fusi, SLP     Current Status/Progress Goal Weekly Team Focus  Bowel/Bladder   continent/incontinent of bowel and bladder; LBM 9/14  To become continent of bowel and bladder with minimal assistance  assess q shift/prn   Swallow/Nutrition/ Hydration             ADL's   Min Assist sit > stand and ambulatory transfers, CGA bathing at shower level, Max assist LB dressing (dependent for footwear)  Supervision  ADL retraining with specific focus on LB dressing and footwear, sit > stand, dynamic standing balance, cognition/sequencing   Mobility   bed mobility mod A, transfers mod A with RW, was walking 40ft with RW min A now walking 28ft with RW min A, 4 steps 2 rails max A, WC mobility 67ft supervision.  supervision, CGA for stairs   functional mobility/transfers, activity tolerance, dynamic standing balance/coordination, ambulation, stair navigation, education, and endurance.   Communication             Safety/Cognition/ Behavioral Observations  Min-Mod A recall, Min-Supervision A problem solving/ error awareness  Min A recall, Supervision A problem solving/emergent awareness  memory notebook, safety awareness   Pain   c/o discomfort on sacrum 3/10  pain < 3  assess q shift/prn   Skin   MASD, stage II to buttocks, L foot and heel wound (aquacel)  remain free of further breakdown/infection  assess q shift/prn; wound care as ordered     Discharge Planning:  Pt wants to stay unitl he feels ready to discharge, made aware up to team and MD recommendations. Returning to boarding house when ready for discharge   Team Discussion: UTI treated per MD and note improved physically and mentally since abx. Adjust DM medications as well. Several skin issues/ left foot, heel, sacrum with orders for care modified and PRAFO ordered for offloading of heel.   Patient on target to meet rehab goals: Yes; currently min assist with PT for steps and Min assist fo rB+D and max assist for lower body dressing and dependent for footwear. Supervision goals overall except CGA for steps.  *See Care Plan and progress notes for long and short-term goals.   Revisions to Treatment Plan:   Teaching Needs: Don/doff of footwear Skin care regimen/wound care Medications, monitoring  Current Barriers to Discharge: Decreased caregiver  support, Home enviroment access/layout and Wound care  Possible Resolutions to Barriers: Review skin care with caregivers/PCS personnel Reinforce off loading of heel and buttocks/pressure relief measures     Medical Summary Current Status: Altered mental status secondary to acute metabolic encephalopathy related to possible drug overdose as well as history of multiple CVAs/vascular dementia  Barriers to Discharge:  Behavior;Medical stability;Home enviroment access/layout;Wound care   Possible Resolutions to Barriers/Weekly Focus: Therapies, follow labs, optimize CBGs, optimize pain meds, abx for UTI, wound management   Continued Need for Acute Rehabilitation Level of Care: The patient requires daily medical management by a physician with specialized training in physical medicine and rehabilitation for the following reasons: Direction of a multidisciplinary physical rehabilitation program to maximize functional independence : Yes Medical management of patient stability for increased activity during participation in an intensive rehabilitation regime.: Yes Analysis of laboratory values and/or radiology reports with any subsequent need for medication adjustment and/or medical intervention. : Yes   I attest that I was present, lead the team conference, and concur with the assessment and plan of the team.   Dorien Chihuahua B 02/15/2020, 2:51 PM

## 2020-02-15 NOTE — Progress Notes (Signed)
Physical Therapy Session Note  Patient Details  Name: Jason Montes MRN: [id number] Date of Birth: June 21, 1950  Today's Date: 02/15/2020 PT Individual Time: 6659-9357 and [id number]  PT Individual Time Calculation (min): 54 min and 24 min  Short Term Goals: Week 2:  PT Short Term Goal 1 (Week 2): pt will perform all bed mobility with CGA PT Short Term Goal 1 - Progress (Week 2): Progressing toward goal PT Short Term Goal 2 (Week 2): pt will transfer sit<>stand with LRAD and min A consistantly PT Short Term Goal 2 - Progress (Week 2): Progressing toward goal PT Short Term Goal 3 (Week 2): Pt will ambulate 63ft with LRAD CGA PT Short Term Goal 3 - Progress (Week 2): Progressing toward goal Week 3:  PT Short Term Goal 1 (Week 3): pt will perform all bed mobility with CGA PT Short Term Goal 2 (Week 3): pt will transfer sit<>stand with LRAD and min A consistantly PT Short Term Goal 3 (Week 3): Pt will ambulate 24ft with LRAD CGA  Skilled Therapeutic Interventions/Progress Updates:  Ambulation/gait training;Cognitive remediation/compensation;Discharge planning;DME/adaptive equipment instruction;Functional mobility training;Pain management;Psychosocial support;Splinting/orthotics;Therapeutic Activities;UE/LE Strength taining/ROM;Visual/perceptual remediation/compensation;Balance/vestibular training;Community reintegration;Disease management/prevention;Functional electrical stimulation;Neuromuscular re-education;Patient/family education;Skin care/wound management;Stair training;Therapeutic Exercise;UE/LE Coordination activities;Wheelchair propulsion/positioning   Today's Interventions: Treatment Session 1: 3009-2330 54 min Received pt supine in bed, pt agreeable to therapy, and denied any pain during session. Session with emphasis on functional mobility/transfers, dressing, generalized strengthening, dynamic standing balance/coordination, ambulation, and improved activity tolerance. Doffed L  Prevalon boot and donned R ted hose, non-skid sock, and L post-op shoe total A. Pt transferred supine<>sitting EOB from flat bed using bedrails with CGA and increased time. Pt donned pants sitting EOB with mod A and pt transferred sit<>stand with RW min A and able to pull pants over hips with CGA and increased time. Pt transferred bed<>WC stand<>pivot with RW and min A. Doffed dirty shirt and donned clean one with supervision and pt brushed teeth, washed face, and applied deodorant sitting in WC at sink with supervision. Pt transported to therapy gym in [facility] total A for time management purposes and ambulated 76ft x2 and 37ft x 1 with RW and CGA. Pt demonstrated narrow BOS, decreased stride length and decreased trunk rotation and required cues to increase stride length and for upright posture. During trial 2 of gait pt reported blurry and dark vision but requested to continue. Therapist encouraged pt to sit and educated pt on importance of recognizing when he feels "off" and stopping to sit for safety and fall prevention. MD present for morning rounds and to inspect L heel and incision. Pt transferred sit<>stand without AD x 4 trials with CGA with cues for anterior weight shifting. Pt transported back to room in [facility] total A. Concluded session with pt sitting in WC, needs within reach, and seatbelt alarm on. Memory book updated.   Treatment Session 2: 1100-1124 24 min Received pt sitting in WC asleep, upon waking pt agreeable to therapy, and denied any pain during session. Session with emphasis on functional mobility/transfers, generalized strengthening, dynamic standing balance/coordination, and improved activity tolerance. Pt transported to therapy gym in [facility] total A for time management purposes and transferred sit<>stand without AD and min A x 3 trials and played cornhole x 3 rounds with CGA for balance using L UE. Pt required multiple rest breaks throughout session due to fatigue. Pt transported back to room in [facility]  total A. Concluded session with pt sitting in WC, needs within reach, and seatbelt alarm on.  Memory book updated.   Therapy Documentation Precautions:  Precautions Precautions: Fall, Other (comment) Precaution Comments: LLE WBAT heel only with post-op shoe Required Braces or Orthoses: Other Brace Other Brace: postop shoe Restrictions Weight Bearing Restrictions: No RLE Weight Bearing: Weight bearing as tolerated LLE Weight Bearing: Weight bearing as tolerated LLE Partial Weight Bearing Percentage or Pounds:  (with ortho boot)   Therapy/Group: Individual Therapy [name] PT, DPT   02/15/2020, 7:26 AM

## 2020-02-15 NOTE — Progress Notes (Signed)
Occupational Therapy Session Note  Patient Details  Name: Jason Montes MRN: [id number] Date of Birth: 18-Dec-1950  Today's Date: 02/15/2020 OT Individual Time: 1410-1505 OT Individual Time Calculation (min): 55 min    Short Term Goals: Week 2:  OT Short Term Goal 1 (Week 2): Pt will perform toilet transfers with Min A OT Short Term Goal 2 (Week 2): Pt will perform LB dress with AE PRN with Min A OT Short Term Goal 3 (Week 2): Pt will don post surgical shoe and R regular shoe with Min A OT Short Term Goal 4 (Week 2): Pt will maintain standing with CGA for 2 mins to complete functional task  Skilled Therapeutic Interventions/Progress Updates:    Treatment session with focus on self-care retraining, sit > stand, and dynamic standing balance, as well as memory strategies.  Pt received upright in w/c reporting fatigue.  Pt pleased with ambulation this date and pleased with progress overall. Discussed current goal level and progress towards goals.  Pt reports that he was able to complete clothing management and toileting without assistance last night.  Pt able to demonstrate doffing and donning pants and shoes with CGA when standing.  Pt required assistance to fasten postop shoe due to decreased ability to visualize velcro strap.  Engaged in d/c planning discussion in regards to recall of day to day tasks as well as appointments, pt reporting relying on his daughter for medication and to remember and transport to medical appointments.  Discussed option of use of computer program, as pt likes computer games, for appointments - pt willing to pursue if easy to use and allows him to be able to make font large enough to read.  Pt completed all sit <> stand and stand pivot transfers CGA with RW this session.  Pt returned to bed CGA and left semi-reclined in bed with all needs in reach.    Therapy Documentation Precautions:  Precautions Precautions: Fall, Other (comment) Precaution Comments: LLE WBAT  heel only with post-op shoe Required Braces or Orthoses: Other Brace Other Brace: postop shoe Restrictions Weight Bearing Restrictions: No RLE Weight Bearing: Weight bearing as tolerated LLE Weight Bearing: Weight bearing as tolerated LLE Partial Weight Bearing Percentage or Pounds:  (with ortho boot) General:   Vital Signs: Therapy Vitals Temp: (!) 97.5 F (36.4 C) Temp Source: Oral Pulse Rate: 78 Resp: 16 BP: (!) 124/56 Patient Position (if appropriate): Sitting Oxygen Therapy SpO2: 99 % O2 Device: Room Air Pain: Pain Assessment Pain Scale: 0-10 Pain Score: 2  Pain Type: Chronic pain;Acute pain Pain Location: Foot Pain Orientation: Right Pain Descriptors / Indicators: Aching Pain Frequency: Intermittent Pain Onset: On-going Patients Stated Pain Goal: 2 Pain Intervention(s): Medication (See eMAR)   Therapy/Group: Individual Therapy  [name] 02/15/2020, 4:20 PM

## 2020-02-16 ENCOUNTER — Inpatient Hospital Stay (HOSPITAL_COMMUNITY): Payer: Medicare Other | Admitting: Occupational Therapy

## 2020-02-16 ENCOUNTER — Inpatient Hospital Stay (HOSPITAL_COMMUNITY): Payer: Medicare Other

## 2020-02-16 ENCOUNTER — Ambulatory Visit: Payer: No Typology Code available for payment source | Admitting: Physician Assistant

## 2020-02-16 ENCOUNTER — Ambulatory Visit: Payer: Medicare Other | Admitting: Urology

## 2020-02-16 ENCOUNTER — Inpatient Hospital Stay (HOSPITAL_COMMUNITY): Payer: Medicare Other | Admitting: Speech Pathology

## 2020-02-16 DIAGNOSIS — L8990 Pressure ulcer of unspecified site, unspecified stage: Secondary | ICD-10-CM

## 2020-02-16 LAB — GLUCOSE, CAPILLARY
Glucose-Capillary: 128 mg/dL — ABNORMAL HIGH (ref 70–99)
Glucose-Capillary: 258 mg/dL — ABNORMAL HIGH (ref 70–99)
Glucose-Capillary: 181 mg/dL — ABNORMAL HIGH (ref 70–99)
Glucose-Capillary: 101 mg/dL — ABNORMAL HIGH (ref 70–99)

## 2020-02-16 NOTE — Progress Notes (Signed)
Occupational Therapy Session Note  Patient Details  Name: Jason Montes MRN: [id number] Date of Birth: January 12, 1951  Today's Date: 02/16/2020 OT Individual Time: 1330-1430 OT Individual Time Calculation (min): 60 min    Short Term Goals: Week 1:  OT Short Term Goal 1 (Week 1): Pt will perform toilet transfers with Min A OT Short Term Goal 1 - Progress (Week 1): Progressing toward goal OT Short Term Goal 2 (Week 1): Pt will transition sit to stand with proper body mechanics with Min A in prep for LB ADLs OT Short Term Goal 2 - Progress (Week 1): Met OT Short Term Goal 3 (Week 1): Pt will perform LB dress with AE PRN with Min A OT Short Term Goal 3 - Progress (Week 1): Progressing toward goal OT Short Term Goal 4 (Week 1): Pt will don post surgical shoe and R regular shoe with Min A OT Short Term Goal 4 - Progress (Week 1): Progressing toward goal  Skilled Therapeutic Interventions/Progress Updates:    1:1 Pt ambulated to the bathroom with post op boot. Pt with difficulty only weight bearing through heel. Showered with left foot covered in seated position. Pt performed lateral leans for bathing buttocks. Pt required A to thread brief and pants but able to don shirt and right shoe. Pt encouraged to transfer into w/c instead of ambulated due to difficulty with weight bearing restrictions. Discussed with PA about ordering a DARCO boot/ off loading shoe to maintain weight bearing. Pt performed transfers with RW with contact guard. Bandages changed with nursing. Pt left resting in the bed with supervision for bed mobility .   Therapy Documentation Precautions:  Precautions Precautions: Fall, Other (comment) Precaution Comments: LLE WBAT heel only with post-op shoe Required Braces or Orthoses: Other Brace Other Brace: postop shoe Restrictions Weight Bearing Restrictions: No RLE Weight Bearing: Weight bearing as tolerated LLE Weight Bearing: Weight bearing as tolerated LLE Partial Weight  Bearing Percentage or Pounds:  (with ortho boot) General:   Vital Signs: Therapy Vitals Temp: 97.8 F (36.6 C) Pulse Rate: 81 Resp: 18 BP: (!) 121/58 Patient Position (if appropriate): Lying Oxygen Therapy SpO2: 99 % O2 Device: Room Air Pain:  no c/o pain  Therapy/Group: Individual Therapy  Willeen Cass [facility] 02/16/2020, 4:07 PM

## 2020-02-16 NOTE — Progress Notes (Signed)
Orthopedic Tech Progress Note Patient Details:  Jason Montes 12/05/1950 [id number] Medium Darco shoe was delivered to patient  Ortho Devices Type of Ortho Device: Darco shoe Ortho Device/Splint Location: LLE Ortho Device/Splint Interventions: Ordered   Post Interventions Instructions Provided: Adjustment of device, Poper ambulation with device   Jason Montes 02/16/2020, 7:09 PM

## 2020-02-16 NOTE — Plan of Care (Signed)
  Problem: Consults Goal: RH GENERAL PATIENT EDUCATION Description: See Patient Education module for education specifics. Outcome: Progressing Goal: Nutrition Consult-if indicated Outcome: Progressing Goal: Diabetes Guidelines if Diabetic/Glucose > 140 Description: If diabetic or lab glucose is > 140 mg/dl - Initiate Diabetes/Hyperglycemia Guidelines & Document Interventions  Outcome: Progressing   Problem: RH BOWEL ELIMINATION Goal: RH STG MANAGE BOWEL WITH ASSISTANCE Description: STG Manage Bowel with  mod I Assistance. Outcome: Progressing Goal: RH STG MANAGE BOWEL W/MEDICATION W/ASSISTANCE Description: STG Manage Bowel with Medication with mod i Assistance. Outcome: Progressing   Problem: RH BLADDER ELIMINATION Goal: RH STG MANAGE BLADDER WITH ASSISTANCE Description: STG Manage Bladder With  mod I Assistance Outcome: Progressing Goal: RH STG MANAGE BLADDER WITH MEDICATION WITH ASSISTANCE Description: STG Manage Bladder With Medication With  mod I Assistance. Outcome: Progressing   Problem: RH SAFETY Goal: RH STG ADHERE TO SAFETY PRECAUTIONS W/ASSISTANCE/DEVICE Description: STG Adhere to Safety Precautions With cues/reminders  Assistance/Device. Outcome: Progressing Goal: RH STG DECREASED RISK OF FALL WITH ASSISTANCE Description: STG Decreased Risk of Fall With cues/reminders Assistance. Outcome: Progressing   Problem: RH PAIN MANAGEMENT Goal: RH STG PAIN MANAGED AT OR BELOW PT'S PAIN GOAL Description: Pain less than or equal to 2. Outcome: Progressing   Problem: RH KNOWLEDGE DEFICIT GENERAL Goal: RH STG INCREASE KNOWLEDGE OF SELF CARE AFTER HOSPITALIZATION Description: Patient will be able to manage care with cues/reminders using handouts and educational resources with cues/reminders Outcome: Progressing

## 2020-02-16 NOTE — Progress Notes (Signed)
[place] PHYSICAL MEDICINE & REHABILITATION PROGRESS NOTE   Subjective/Complaints: Patient seen laying in bed this morning, back to work with therapies.  He states he slept well overnight.  He states his heel feels better with PRAFO.  ROS: Denies CP, SOB, N/V/D  Objective:   No results found. Recent Labs    02/15/20 1042  WBC 9.9  HGB 8.6*  HCT 28.1*  PLT PLATELET CLUMPS NOTED ON SMEAR, UNABLE TO ESTIMATE   Recent Labs    02/14/20 1157  GLUCOSE 397*    Intake/Output Summary (Last 24 hours) at 02/16/2020 1240 Last data filed at 02/16/2020 1023 Gross per 24 hour  Intake 960 ml  Output 2000 ml  Net -1040 ml     Physical Exam: Vital Signs Blood pressure 118/61, pulse 86, temperature 98.3 F (36.8 C), resp. rate 18, weight 75.1 kg, SpO2 94 %. Constitutional: No distress . Vital signs reviewed. HENT: Normocephalic.  Atraumatic. Eyes: EOMI. No discharge. Cardiovascular: No JVD.  RRR. Respiratory: Normal effort.  No stridor.  Bilateral clear to auscultation. GI: Non-distended.  BS +. Skin: Left foot with dressing CDI Left heel ulcer Small sacral decubitus ulcer x2 Psych: Normal mood.  Normal behavior. Musc: Left foot with improving edema and tenderness Left lower extremity first digit amputation Neuro: Alert Motor: 4+/5 throughout, except left foot, stable Bilateral upper extremity resting tremors, improved  Assessment/Plan: 1. Functional deficits secondary to acute metabolic encephalopathy/drug overdose and hx of multiple CVAs which require 3+ hours per day of interdisciplinary therapy in a comprehensive inpatient rehab setting.  Physiatrist is providing close team supervision and 24 hour management of active medical problems listed below.  Physiatrist and rehab team continue to assess barriers to discharge/monitor patient progress toward functional and medical goals  Care Tool:  Bathing    Body parts bathed by patient: Right arm, Left arm, Chest, Abdomen,  Right upper leg, Left upper leg, Face, Front perineal area, Buttocks, Right lower leg   Body parts bathed by helper: Right lower leg, Buttocks Body parts n/a: Left lower leg   Bathing assist Assist Level: Contact Guard/Touching assist     Upper Body Dressing/Undressing Upper body dressing   What is the patient wearing?: Pull over shirt    Upper body assist Assist Level: Set up assist    Lower Body Dressing/Undressing Lower body dressing      What is the patient wearing?: Pants     Lower body assist Assist for lower body dressing: Contact Guard/Touching assist     Toileting Toileting    Toileting assist Assist for toileting: Dependent - Patient 0%     Transfers Chair/bed transfer  Transfers assist     Chair/bed transfer assist level: Minimal Assistance - Patient > 75%     Locomotion Ambulation   Ambulation assist   Ambulation activity did not occur: Safety/medical concerns  Assist level: Contact Guard/Touching assist Assistive device: Walker-rolling Max distance: 35ft   Walk 10 feet activity   Assist  Walk 10 feet activity did not occur: Safety/medical concerns  Assist level: Contact Guard/Touching assist Assistive device: Walker-rolling   Walk 50 feet activity   Assist Walk 50 feet with 2 turns activity did not occur: Safety/medical concerns  Assist level: Contact Guard/Touching assist Assistive device: Walker-rolling    Walk 150 feet activity   Assist Walk 150 feet activity did not occur: Safety/medical concerns         Walk 10 feet on uneven surface  activity   Assist Walk 10 feet on  uneven surfaces activity did not occur: Safety/medical concerns         Wheelchair     Assist Will patient use wheelchair at discharge?: No Type of Wheelchair: Manual    Wheelchair assist level: Supervision/Verbal cueing Max wheelchair distance: 50ft    Wheelchair 50 feet with 2 turns activity    Assist        Assist Level:  Supervision/Verbal cueing   Wheelchair 150 feet activity     Assist      Assist Level: Maximal Assistance - Patient 25 - 49%   Blood pressure 118/61, pulse 86, temperature 98.3 F (36.8 C), resp. rate 18, weight 75.1 kg, SpO2 94 %.  Medical Problem List and Plan: 1.  Altered mental status secondary to acute metabolic encephalopathy related to possible drug overdose as well as history of multiple CVAs/vascular dementia  Continue CIR 2.  Antithrombotics: -DVT/anticoagulation: Eliquis             -antiplatelet therapy: Aspirin 81 mg daily, Plavix 75 mg daily 3. Pain Management: Neurontin 200 mg 3 times daily, Cymbalta 20 mg daily.   Relatively controlled with meds on 9/16 4. Mood: Provide emotional support             -antipsychotic agents: Haldol 1 mg BID, Zyprexa 12.5 mg nightly  He was seen by psychiatry, notes reviewed-continue supportive care. 5. Neuropsych: This patient is not fully capable of making decisions on his own behalf. 6. Skin/Wound Care: Routine skin checks  DCed Prevalon boot, ordered PRAFO 7. Fluids/Electrolytes/Nutrition: Routine in and outs with follow-up chemistries 8.  Atrial fibrillation.  Amiodarone 400 mg daily.  Controlled on 9/16 9.  Chronic left hallux osteomyelitis.  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 Caroline More.  Weightbearing as tolerated through left heel only with postoperative shoe  Needs cues to remember WB precautions 10.  Hypertension.  Monitor with increased mobility.   Controlled on 9/16 11.  Diabetes mellitus with peripheral neuropathy.  Hemoglobin A1c 7.9.    NovoLog 6 units 3 times daily with meals, increased to 9 3 times daily on 9/9, increased to 11 3 times daily on 9/14  Diabetic teaching.   Increased Lantus to 14U on 9/7.   CBG (last 3)  Recent Labs    02/15/20 2118 02/16/20 0616 02/16/20 1200  GLUCAP 117* 128* 258*   Remains labile 9/16 12.  CKD stage III as well as bouts of hyperkalemia.  Admission  creatinine 1.59.  Continue Lokelma 5 mg twice daily.  Creatinine 1.34 on 9/13  Encourage fluids 13.  Hyperlipidemia.  Lipitor 14.  CAD with history of pacemaker.  Continue aspirin 15.  Chronic systolic congestive heart failure.  Latest ejection fraction 40 to 45%.  Monitor for any signs of fluid overload   Filed Weights   02/14/20 0358 02/15/20 0424 02/16/20 0500  Weight: 73.6 kg 74.2 kg 75.1 kg   ?  Trending up on 9/16 16.  Urinary retention.    Increased Flomax to 0.8 mg daily.  17. Orthostatic hypotension: abdominal binder and TEDs ordered  Improving 18.  Thrombocytopenia  Platelets 64 on 9/10 repeat with clumps again  Discussed with pharmacy - no obvious offending agents  Will monitor closely and may need to adjust anticoagulation due to increased risk of bleeding  Continue to monitor 19.  Chronic hyponatremia  Sodium 133 on 9/14.,  Labs ordered for tomorrow 20. UTI  Dysuria with foul odor  WBCs within normal limits on 9/13.  UA +, UC +  for >100,000 proteus mirabilis  Completed course of Keflex  21. Acute blood loss anemia  Hemoglobin 8.6 on 9/15, labs ordered for tomorrow 22. Constipation: prune juice with meals.  23. Dry mouth: ordered ice chips  LOS: 13 days A FACE TO FACE EVALUATION WAS PERFORMED  Jason Montes 02/16/2020, 12:40 PM

## 2020-02-16 NOTE — Progress Notes (Signed)
Physical Therapy Session Note  Patient Details  Name: Jason Montes MRN: [id number] Date of Birth: 1950/06/24  Today's Date: 02/16/2020 PT Individual Time: 0830-0924 PT Individual Time Calculation (min): 54 min   Short Term Goals: Week 2:  PT Short Term Goal 1 (Week 2): pt will perform all bed mobility with CGA PT Short Term Goal 1 - Progress (Week 2): Progressing toward goal PT Short Term Goal 2 (Week 2): pt will transfer sit<>stand with LRAD and min A consistantly PT Short Term Goal 2 - Progress (Week 2): Met PT Short Term Goal 3 (Week 2): Pt will ambulate 30f with LRAD CGA PT Short Term Goal 3 - Progress (Week 2): Met Week 3:  PT Short Term Goal 1 (Week 3): pt will perform all bed mobility with CGA consistantly PT Short Term Goal 2 (Week 3): pt will transfer sit<>stand with LRAD and CGA consistantly PT Short Term Goal 3 (Week 3): Pt will ambulate 1086fwith LRAD CGA  Skilled Therapeutic Interventions/Progress Updates:   Received pt supine in bed with MD present to perform morning rounds, pt agreeable to therapy, and denied any pain during session. Session with emphasis on functional mobility/transfers, dressing, dynamic standing balance/coordination, ambulation, stair navigation, and improved activity tolerance. Doffed L PRAFO boot and donned R ted hose, non-skid sock, and L post-op shoe total A for time management and pt transferred supine<>sitting EOB from flat bed with CGA, increased time, and use of bedrails. Donned pants sitting EOB with min A and transferred sit<>stand with RW min A with cues for anterior weight shifting and able to pull pants over hips with CGA. Stand<>pivot bed<>WC with RW min A. Pt reported feeling "sticky" on his chest from breakfast this morning and requested to wash up. When asked what breakfast food item caused him to be sticky, pt with trouble word finding the word syrup calling it "sticky water". Pt washed chest, brushed teeth, washed face, and applied  deodorant sitting in WC at sink with supervision. Donned clean pull over shirt with min A and cues for sequencing as pt trying to put shirt on backwards. [facility] for time management purposes and navigated 8 steps with 2 rails min A ascending and descending with a step to pattern with cues for foot placement and sequencing. Pt ambulated 802fith RW and CGA with cues to avoid running into obstacles due to visual deficits. Pt declined ambulating any more, stating "I can't do it" due to increased fatigue. [facility] Concluded session with pt sitting in WC, needs within reach, and seatbelt alarm on.   Therapy Documentation Precautions:  Precautions Precautions: Fall, Other (comment) Precaution Comments: LLE WBAT heel only with post-op shoe Required Braces or Orthoses: Other Brace Other Brace: postop shoe Restrictions Weight Bearing Restrictions: No RLE Weight Bearing: Weight bearing as tolerated LLE Weight Bearing: Weight bearing as tolerated LLE Partial Weight Bearing Percentage or Pounds:  (with ortho boot)   Therapy/Group: Individual Therapy AnnAlfonse Alpers, DPT   02/16/2020, 7:20 AM

## 2020-02-17 ENCOUNTER — Inpatient Hospital Stay (HOSPITAL_COMMUNITY): Payer: Medicare Other | Admitting: Occupational Therapy

## 2020-02-17 ENCOUNTER — Inpatient Hospital Stay (HOSPITAL_COMMUNITY): Payer: Medicare Other | Admitting: Speech Pathology

## 2020-02-17 ENCOUNTER — Inpatient Hospital Stay (HOSPITAL_COMMUNITY): Payer: Medicare Other

## 2020-02-17 DIAGNOSIS — L89312 Pressure ulcer of right buttock, stage 2: Secondary | ICD-10-CM

## 2020-02-17 LAB — CBC WITH DIFFERENTIAL/PLATELET
Abs Immature Granulocytes: 0.21 10*3/uL — ABNORMAL HIGH (ref 0.00–0.07)
Basophils Absolute: 0 10*3/uL (ref 0.0–0.1)
Basophils Relative: 0 %
Eosinophils Absolute: 0.3 10*3/uL (ref 0.0–0.5)
Eosinophils Relative: 4 %
HCT: 27.9 % — ABNORMAL LOW (ref 39.0–52.0)
Hemoglobin: 8.3 g/dL — ABNORMAL LOW (ref 13.0–17.0)
Immature Granulocytes: 2 %
Lymphocytes Relative: 12 %
Lymphs Abs: 1.1 10*3/uL (ref 0.7–4.0)
MCH: 26.5 pg (ref 26.0–34.0)
MCHC: 29.7 g/dL — ABNORMAL LOW (ref 30.0–36.0)
MCV: 89.1 fL (ref 80.0–100.0)
Monocytes Absolute: 0.7 10*3/uL (ref 0.1–1.0)
Monocytes Relative: 7 %
Neutro Abs: 7.3 10*3/uL (ref 1.7–7.7)
Neutrophils Relative %: 75 %
Platelets: UNDETERMINED 10*3/uL (ref 150–400)
RBC: 3.13 MIL/uL — ABNORMAL LOW (ref 4.22–5.81)
RDW: 16.7 % — ABNORMAL HIGH (ref 11.5–15.5)
WBC: 9.7 10*3/uL (ref 4.0–10.5)
nRBC: 0 % (ref 0.0–0.2)

## 2020-02-17 LAB — GLUCOSE, CAPILLARY
Glucose-Capillary: 133 mg/dL — ABNORMAL HIGH (ref 70–99)
Glucose-Capillary: 214 mg/dL — ABNORMAL HIGH (ref 70–99)
Glucose-Capillary: 194 mg/dL — ABNORMAL HIGH (ref 70–99)
Glucose-Capillary: 122 mg/dL — ABNORMAL HIGH (ref 70–99)

## 2020-02-17 LAB — PLATELET BY CITRATE: Platelet CT in Citrate: UNDETERMINED

## 2020-02-17 LAB — BASIC METABOLIC PANEL
Anion gap: 6 (ref 5–15)
BUN: 27 mg/dL — ABNORMAL HIGH (ref 8–23)
CO2: 22 mmol/L (ref 22–32)
Calcium: 8.5 mg/dL — ABNORMAL LOW (ref 8.9–10.3)
Chloride: 103 mmol/L (ref 98–111)
Creatinine, Ser: 1.31 mg/dL — ABNORMAL HIGH (ref 0.61–1.24)
GFR calc Af Amer: >60 mL/min (ref >60)
GFR calc non Af Amer: 56 mL/min — ABNORMAL LOW (ref >60)
Glucose, Bld: 145 mg/dL — ABNORMAL HIGH (ref 70–99)
Potassium: 4.6 mmol/L (ref 3.5–5.1)
Sodium: 131 mmol/L — ABNORMAL LOW (ref 135–145)

## 2020-02-17 NOTE — Plan of Care (Signed)
°  Problem: Consults Goal: RH GENERAL PATIENT EDUCATION Description: See Patient Education module for education specifics. Outcome: Progressing Goal: Nutrition Consult-if indicated Outcome: Progressing Goal: Diabetes Guidelines if Diabetic/Glucose > 140 Description: If diabetic or lab glucose is > 140 mg/dl - Initiate Diabetes/Hyperglycemia Guidelines & Document Interventions  Outcome: Progressing   Problem: RH BOWEL ELIMINATION Goal: RH STG MANAGE BOWEL WITH ASSISTANCE Description: STG Manage Bowel with  mod I Assistance. Outcome: Progressing Goal: RH STG MANAGE BOWEL W/MEDICATION W/ASSISTANCE Description: STG Manage Bowel with Medication with mod i Assistance. Outcome: Progressing   Problem: RH BLADDER ELIMINATION Goal: RH STG MANAGE BLADDER WITH ASSISTANCE Description: STG Manage Bladder With  mod I Assistance Outcome: Progressing Goal: RH STG MANAGE BLADDER WITH MEDICATION WITH ASSISTANCE Description: STG Manage Bladder With Medication With  mod I Assistance. Outcome: Progressing   Problem: RH SKIN INTEGRITY Goal: RH STG SKIN FREE OF INFECTION/BREAKDOWN Description: Patient will be able to manage skin with min assist Outcome: Progressing Goal: RH STG MAINTAIN SKIN INTEGRITY WITH ASSISTANCE Description: STG Maintain Skin Integrity With min Assistance. Outcome: Progressing Goal: RH STG ABLE TO PERFORM INCISION/WOUND CARE W/ASSISTANCE Description: STG Able To Perform Incision/Wound Care With  min Assistance. Outcome: Progressing   Problem: RH SAFETY Goal: RH STG ADHERE TO SAFETY PRECAUTIONS W/ASSISTANCE/DEVICE Description: STG Adhere to Safety Precautions With cues/reminders  Assistance/Device. Outcome: Progressing Goal: RH STG DECREASED RISK OF FALL WITH ASSISTANCE Description: STG Decreased Risk of Fall With cues/reminders Assistance. Outcome: Progressing   Problem: RH PAIN MANAGEMENT Goal: RH STG PAIN MANAGED AT OR BELOW PT'S PAIN GOAL Description: Pain less than or  equal to 2. Outcome: Progressing   Problem: RH KNOWLEDGE DEFICIT GENERAL Goal: RH STG INCREASE KNOWLEDGE OF SELF CARE AFTER HOSPITALIZATION Description: Patient will be able to manage care with cues/reminders using handouts and educational resources with cues/reminders Outcome: Progressing

## 2020-02-17 NOTE — Progress Notes (Signed)
Speech Language Pathology Discharge Summary  Patient Details  Name: Jason Montes MRN: [id number] Date of Birth: [birth year]  Today's Date: 02/17/2020 SLP Individual Time: 1000-1055 SLP Individual Time Calculation (min): 55 min   Skilled Therapeutic Interventions:  Skilled treatment session focused on cognitive goals. SLP facilitated session by providing total A for recall of goals for skilled SLP intervention and was unable to name any memory compensatory strategies he was been attempting to utilize. When this was brought to the patient's attention, he reported, "my memory is broken and I don't care about trying to remember things." He also reported he lives an "easy life" and has assistance from family and friends for meals, medications and money management. Patient verbalized his "normal routine" and a checklist was made and laminated for use at home to ensure daily needs are met. Patient appeared to like this idea and was agreeable. Due to patient's history of baseline memory deficits, minimal progress with strategies and minimal interest in improving overall memory, patient will be discharged from skilled SLP intervention with f/u not warranted at this time. Patient left upright in the wheelchair with alarm on and all needs within reach. Continue with current plan of care.   Patient has met 2 of 4 long term goals.  Patient to discharge at overall Supervision;Max level.   Reasons goals not met: Patient has a history of baseline memory deficits and continues to require total A for use of memory strategies to maximize recall and carryover of daily information.    Clinical Impression/Discharge Summary: Patient has made minimal gains and has met 2 of 4 LTGs this admission. Currently, patient requires supervision verbal cues for basic problem solving with functional tasks and overall Max-Total A for utilization of memory compensatory strategies and recall of daily information. Patient has a  history of baseline memory deficits and reports he has no interest in utilizing memory strategies and that he compensates by living an "easy life" with assistance from a caregiver for meals, medication management and money management. A variety of memory strategies have been introduced while on CIR with little to no carryover. Due to patient's history of baseline memory deficits, minimal progress with strategies and minimal interest in improving overall memory, patient will be discharged from skilled SLP intervention with f/u not warranted at this time.   Recommendation:  None      Equipment: N/A   Reasons for discharge: Lack of progress towards goals   Patient/Family Agrees with Progress Made and Goals Achieved: Yes    Jason Montes, [place] 02/17/2020, 10:48 AM

## 2020-02-17 NOTE — Progress Notes (Signed)
[place] PHYSICAL MEDICINE & REHABILITATION PROGRESS NOTE   Subjective/Complaints: Patient seen patient seen ambulating with therapies and later sitting down in his chair this morning.  He states he slept well overnight.  He is in good spirits this morning.  Discussed offloading to sacrum.  ROS: Denies CP, SOB, N/V/D  Objective:   No results found. Recent Labs    02/15/20 1042 02/17/20 0634  WBC 9.9 9.7  HGB 8.6* 8.3*  HCT 28.1* 27.9*  PLT PLATELET CLUMPS NOTED ON SMEAR, UNABLE TO ESTIMATE PLATELET CLUMPS NOTED ON SMEAR, UNABLE TO ESTIMATE   Recent Labs    02/14/20 1157 02/17/20 0634  NA  --  131*  K  --  4.6  CL  --  103  CO2  --  22  GLUCOSE 397* 145*  BUN  --  27*  CREATININE  --  1.31*  CALCIUM  --  8.5*    Intake/Output Summary (Last 24 hours) at 02/17/2020 0909 Last data filed at 02/17/2020 0645 Gross per 24 hour  Intake 840 ml  Output 2600 ml  Net -1760 ml     Physical Exam: Vital Signs Blood pressure (!) 121/59, pulse 87, temperature 98 F (36.7 C), temperature source Oral, resp. rate 20, weight 80.3 kg, SpO2 92 %. Constitutional: No distress . Vital signs reviewed. HENT: Normocephalic.  Atraumatic. Eyes: EOMI. No discharge. Cardiovascular: No JVD.  RRR. Respiratory: Normal effort.  No stridor.  Bilateral clear to auscultation. GI: Non-distended.  BS +. Skin: Left foot with dressing CDI Left heel ulcer Left sacral decubitus ulcers, not examined today Psych: Normal mood.  Normal behavior. Musc: Left foot with improving edema and tenderness Left lower extremity first digit amputation Neuro: Alert Motor: 4+/5 throughout, except left foot, unchanged Bilateral upper extremity resting tremors, stable  Assessment/Plan: 1. Functional deficits secondary to acute metabolic encephalopathy/drug overdose and hx of multiple CVAs which require 3+ hours per day of interdisciplinary therapy in a comprehensive inpatient rehab setting.  Physiatrist is providing  close team supervision and 24 hour management of active medical problems listed below.  Physiatrist and rehab team continue to assess barriers to discharge/monitor patient progress toward functional and medical goals  Care Tool:  Bathing    Body parts bathed by patient: Right arm, Left arm, Chest, Abdomen, Right upper leg, Left upper leg, Face, Front perineal area, Buttocks, Right lower leg   Body parts bathed by helper: Right lower leg, Buttocks Body parts n/a: Left lower leg   Bathing assist Assist Level: Contact Guard/Touching assist     Upper Body Dressing/Undressing Upper body dressing   What is the patient wearing?: Pull over shirt    Upper body assist Assist Level: Set up assist    Lower Body Dressing/Undressing Lower body dressing      What is the patient wearing?: Pants     Lower body assist Assist for lower body dressing: Contact Guard/Touching assist     Toileting Toileting    Toileting assist Assist for toileting: Dependent - Patient 0%     Transfers Chair/bed transfer  Transfers assist     Chair/bed transfer assist level: Minimal Assistance - Patient > 75%     Locomotion Ambulation   Ambulation assist   Ambulation activity did not occur: Safety/medical concerns  Assist level: Contact Guard/Touching assist Assistive device: Walker-rolling Max distance: 63ft   Walk 10 feet activity   Assist  Walk 10 feet activity did not occur: Safety/medical concerns  Assist level: Contact Guard/Touching assist Assistive device: Walker-rolling  Walk 50 feet activity   Assist Walk 50 feet with 2 turns activity did not occur: Safety/medical concerns  Assist level: Contact Guard/Touching assist Assistive device: Walker-rolling    Walk 150 feet activity   Assist Walk 150 feet activity did not occur: Safety/medical concerns         Walk 10 feet on uneven surface  activity   Assist Walk 10 feet on uneven surfaces activity did not occur:  Safety/medical concerns         Wheelchair     Assist Will patient use wheelchair at discharge?: No Type of Wheelchair: Manual    Wheelchair assist level: Supervision/Verbal cueing Max wheelchair distance: 39ft    Wheelchair 50 feet with 2 turns activity    Assist        Assist Level: Supervision/Verbal cueing   Wheelchair 150 feet activity     Assist      Assist Level: Maximal Assistance - Patient 25 - 49%   Blood pressure (!) 121/59, pulse 87, temperature 98 F (36.7 C), temperature source Oral, resp. rate 20, weight 80.3 kg, SpO2 92 %.  Medical Problem List and Plan: 1.  Altered mental status secondary to acute metabolic encephalopathy related to possible drug overdose as well as history of multiple CVAs/vascular dementia  Continue CIR 2.  Antithrombotics: -DVT/anticoagulation: Eliquis             -antiplatelet therapy: Aspirin 81 mg daily, Plavix 75 mg daily 3. Pain Management: Neurontin 200 mg 3 times daily, Cymbalta 20 mg daily.   Relatively controlled with meds on 9/17 4. Mood: Provide emotional support             -antipsychotic agents: Haldol 1 mg BID, Zyprexa 12.5 mg nightly  He was seen by psychiatry, notes reviewed-continue supportive care. 5. Neuropsych: This patient is not fully capable of making decisions on his own behalf. 6. Skin/Wound Care: Routine skin checks  DCed Prevalon boot, ordered PRAFO with benefit  Discussed offloading the sacral area 7. Fluids/Electrolytes/Nutrition: Routine in and outs with follow-up chemistries 8.  Atrial fibrillation.  Amiodarone 400 mg daily.  Controlled on 9/17 9.  Chronic left hallux osteomyelitis.  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 Caroline More.  Weightbearing as tolerated through left heel only with postoperative shoe  Needs cues to remember WB precautions 10.  Hypertension.  Monitor with increased mobility.   Controlled on 9/16 11.  Diabetes mellitus with peripheral neuropathy.   Hemoglobin A1c 7.9.    NovoLog 6 units 3 times daily with meals, increased to 9 3 times daily on 9/9, increased to 11 3 times daily on 9/14  Diabetic teaching.   Increased Lantus to 14U on 9/7.   CBG (last 3)  Recent Labs    02/16/20 1708 02/16/20 2050 02/17/20 0621  GLUCAP 181* 101* 133*   Labile, but?  Improving on 9/17 12.  CKD stage III as well as bouts of hyperkalemia.  Admission creatinine 1.59.  Continue Lokelma 5 mg twice daily.  Creatinine 1.31 on 9/17  Encourage fluids 13.  Hyperlipidemia.  Lipitor 14.  CAD with history of pacemaker.  Continue aspirin 15.  Chronic systolic congestive heart failure.  Latest ejection fraction 40 to 45%.  Monitor for any signs of fluid overload   Filed Weights   02/15/20 0424 02/16/20 0500 02/17/20 0700  Weight: 74.2 kg 75.1 kg 80.3 kg   ?Reliability on 9/17 16.  Urinary retention.    Increased Flomax to 0.8 mg daily.  17.  Orthostatic hypotension: abdominal binder and TEDs ordered  Improving 18.  Thrombocytopenia  Platelets 64 on 9/10 repeat with clumps again, discussed with lab - plan for redraw with citrate  Discussed with pharmacy - no obvious offending agents  Will monitor closely and may need to adjust anticoagulation due to increased risk of bleeding  Continue to monitor 19.  Chronic hyponatremia  Sodium 131 on 9/17 20. UTI  Dysuria with foul odor  WBCs within normal limits on 9/13.  UA +, UC + for >100,000 proteus mirabilis  Completed course of Keflex  21. Acute blood loss anemia  Hemoglobin 8.3 on 9/17 22. Constipation: prune juice with meals.  23. Dry mouth: ordered ice chips  LOS: 14 days A FACE TO FACE EVALUATION WAS PERFORMED  Rosco Harriott Lorie Phenix 02/17/2020, 9:09 AM

## 2020-02-17 NOTE — Progress Notes (Signed)
Occupational Therapy Session Note  Patient Details  Name: Jason Montes MRN: [id number] Date of Birth: 11-Sep-1950  Today's Date: 02/17/2020 OT Individual Time: 1300-1415 OT Individual Time Calculation (min): 75 min    Short Term Goals: Week 1:  OT Short Term Goal 1 (Week 1): Pt will perform toilet transfers with Min A OT Short Term Goal 1 - Progress (Week 1): Progressing toward goal OT Short Term Goal 2 (Week 1): Pt will transition sit to stand with proper body mechanics with Min A in prep for LB ADLs OT Short Term Goal 2 - Progress (Week 1): Met OT Short Term Goal 3 (Week 1): Pt will perform LB dress with AE PRN with Min A OT Short Term Goal 3 - Progress (Week 1): Progressing toward goal OT Short Term Goal 4 (Week 1): Pt will don post surgical shoe and R regular shoe with Min A OT Short Term Goal 4 - Progress (Week 1): Progressing toward goal Week 2:  OT Short Term Goal 1 (Week 2): Pt will perform toilet transfers with Min A OT Short Term Goal 2 (Week 2): Pt will perform LB dress with AE PRN with Min A OT Short Term Goal 3 (Week 2): Pt will don post surgical shoe and R regular shoe with Min A OT Short Term Goal 4 (Week 2): Pt will maintain standing with CGA for 2 mins to complete functional task  Skilled Therapeutic Interventions/Progress Updates:    1:1 Pt received in the w/c. Pt discussed he is appreciative of all everyone does but he is ready to go home. Discussed his d/c plans and his ability to assistance. He has a friend who lives down the hall (from where he rents a room that can help him). Pt reports if he can get his left foot covered he wants to shower at home- he does have a shower chair. He reports he will need to be covered when showering with neighbor's assistance (for privacy). Tried to problem solving through how this could work out at home. Transferred into the shower with RW; ambulating into the bathroom. Pt did required A to doff shoes/ [employer] shoe and pants. Pt also  not able to don underwear with lateral leans requiring A to thread and don shoes. Pt decided he would have to wear a bathing suit in the shower if his friend was going to help. Pt ambulated back out to the room and into the w/c. Pt able to thread pants with extra time. Pt reports feeling more unstable on his feet with the [place] but forefoot is more off loaded. Left resting in the w/c with left Le elevated.   Therapy Documentation Precautions:  Precautions Precautions: Fall, Other (comment) Precaution Comments: LLE WBAT heel only with post-op shoe Required Braces or Orthoses: Other Brace Other Brace: postop shoe Restrictions Weight Bearing Restrictions: No RLE Weight Bearing: Weight bearing as tolerated LLE Weight Bearing: Weight bearing as tolerated LLE Partial Weight Bearing Percentage or Pounds: ortho shoe Pain:  No c/o pain in session    Therapy/Group: Individual Therapy  Willeen Cass [facility] 02/17/2020, 3:17 PM

## 2020-02-17 NOTE — Progress Notes (Signed)
Physical Therapy Session Note  Patient Details  Name: Jason Montes MRN: [id number] Date of Birth: 1951-03-03  Today's Date: 02/17/2020 PT Individual Time: 0800-0857 PT Individual Time Calculation (min): 57 min   Short Term Goals: Week 3:  PT Short Term Goal 1 (Week 3): pt will perform all bed mobility with CGA consistantly PT Short Term Goal 2 (Week 3): pt will transfer sit<>stand with LRAD and CGA consistantly PT Short Term Goal 3 (Week 3): Pt will ambulate 12ft with LRAD CGA  Skilled Therapeutic Interventions/Progress Updates:    Patient received supine in bed agreeable to PT. He denies pain at this time. Patient requesting to use restroom before therapy. TotalA to don new heel wedge pots-op shoe to L LE in sitting. ModA to don shoe to R LE. He was able to ambulate from bed to toilet with CGA-MinA. Patient initially able to doff pants/brief while standing with SPV, but stated he required assist to don pants in standing once done d/t fear of falling. PT provided MaxA for this. PT propelled patient to therapy gym for time management. He was able to complete 4 STS with SBA and verbal cues for hand placement. Patient ambulating 158ft with CGA + FWW. Patient completing forward rolls onto physioball in seating to encourage anterior lean to facilitate greater ease with STS. Patient demo-ing poor trunk flexion at the pelvis, likely related to tight hamstrings preventing greater forward flexion. Patient maintaining slow gait speed ambulating 121ft again with FWW + CGA. Patient returning to room in wc, seatbelt alarm on, call light within reach.   Therapy Documentation Precautions:  Precautions Precautions: Fall, Other (comment) Precaution Comments: LLE WBAT heel only with post-op shoe Required Braces or Orthoses: Other Brace Other Brace: postop shoe Restrictions Weight Bearing Restrictions: No RLE Weight Bearing: Weight bearing as tolerated LLE Weight Bearing: Weight bearing as  tolerated LLE Partial Weight Bearing Percentage or Pounds: Ortho shoe   Therapy/Group: Individual Therapy  Karoline Caldwell, PT, DPT, CBIS 02/17/2020, 7:40 AM

## 2020-02-18 ENCOUNTER — Inpatient Hospital Stay (HOSPITAL_COMMUNITY): Payer: Medicare Other | Admitting: Occupational Therapy

## 2020-02-18 ENCOUNTER — Inpatient Hospital Stay (HOSPITAL_COMMUNITY): Payer: Medicare Other | Admitting: Physical Therapy

## 2020-02-18 LAB — GLUCOSE, CAPILLARY
Glucose-Capillary: 162 mg/dL — ABNORMAL HIGH (ref 70–99)
Glucose-Capillary: 266 mg/dL — ABNORMAL HIGH (ref 70–99)
Glucose-Capillary: 239 mg/dL — ABNORMAL HIGH (ref 70–99)
Glucose-Capillary: 207 mg/dL — ABNORMAL HIGH (ref 70–99)

## 2020-02-18 NOTE — Progress Notes (Signed)
Physical Therapy Session Note  Patient Details  Name: Jason Montes MRN: [id number] Date of Birth: 12/20/1950  Today's Date: 02/18/2020 PT Individual Time: 1305-1350 PT Individual Time Calculation (min): 45 min   Short Term Goals: Week 3:  PT Short Term Goal 1 (Week 3): pt will perform all bed mobility with CGA consistantly PT Short Term Goal 2 (Week 3): pt will transfer sit<>stand with LRAD and CGA consistantly PT Short Term Goal 3 (Week 3): Pt will ambulate 100ft with LRAD CGA  Skilled Therapeutic Interventions/Progress Updates:   Pt received sitting in WC and agreeable to PT. Pt performed WC mobility through hall x 120ft with min assist and cues to prevent hitting obstacles on the R and veering to the R due to R UE weakness.   Pt transported to entrance of WCC in WC. Gait training with RW x 130ft, 100ft and 50ft with CGA from PT clothing management. Min cues from Pt to WB precautions through the heel only on the L as well as safety with AD management on unlevel cement in turns to prevent LOB.   Pt performed 5xSTS: 32 sec (>15 sec indicates increased fall risk; 36, 30, 31 sec ) min cues for improved erect posture as well as increased anterior weight shift to prevent posterior LOB throughout each bout. Sit<>Stand transfers performed with supervision Assist throughout treatment with increasing time with fatigue at end of session.   Patient returned to room and left sitting in WC with call bell in reach and all needs met.          Therapy Documentation Precautions:  Precautions Precautions: Fall, Other (comment) Precaution Comments: LLE WBAT heel only with post-op shoe Required Braces or Orthoses: Other Brace Other Brace: postop shoe Restrictions Weight Bearing Restrictions: Yes RLE Weight Bearing: Weight bearing as tolerated LLE Weight Bearing: Weight bearing as tolerated LLE Partial Weight Bearing Percentage or Pounds: Postop Ortho Shoe Vital Signs: Therapy  Vitals Temp: (!) 97.5 F (36.4 C) Pulse Rate: 76 Resp: 20 BP: (!) 111/57 Patient Position (if appropriate): Sitting Oxygen Therapy SpO2: 99 % O2 Device: Room Air Pain: Pain Assessment Pain Score: denies  Therapy/Group: Individual Therapy   E  02/18/2020, 1:55 PM  

## 2020-02-18 NOTE — Progress Notes (Signed)
Occupational Therapy Session Note  Patient Details  Name: Jason Montes MRN: [id number] Date of Birth: [birth year]  Today's Date: 02/18/2020 OT Individual Time: 0950-1050 OT Individual Time Calculation (min): 60 min    Short Term Goals: Week 2:  OT Short Term Goal 1 (Week 2): Pt will perform toilet transfers with Min A OT Short Term Goal 2 (Week 2): Pt will perform LB dress with AE PRN with Min A OT Short Term Goal 3 (Week 2): Pt will don post surgical shoe and R regular shoe with Min A OT Short Term Goal 4 (Week 2): Pt will maintain standing with CGA for 2 mins to complete functional task  Skilled Therapeutic Interventions/Progress Updates: Patient overall focus this sessionwas  To increase indepdence,safety and cognition in order to safely go back home with assist from a "paid" caregiver.   Today he focused and performed as follows:     UB bathing and dressing= setup; Lower body bathing and dressing.   Overall moderate assistance as bilateral shoes difficult to don.   Patient often stated he could not complete tasks (I.e. donning right shoe), but when offered a problem solving strategy or extra time to try, he succeeded 90% of the time.  As well, he stated he could use his right arm and had no feeling.  However, when this clinician educated him on right and bilateral UE use focusing on neuro reducation opportunities for his right, he stated, "Well, I feel something in that arm and hand.   Wow I did not think I could."     He was to complete sit to stand with extra time to process the steps and performed dynamic standing balance activities x5 with close S.   He was left seated in his w/c with arlm engaged at the end of the session.  Continued OT Plan of care     Therapy Documentation Precautions:  Precautions Precautions: Fall, Other (comment) Precaution Comments: LLE WBAT heel only with post-op shoe Required Braces or Orthoses: Other Brace Other Brace: postop  shoe Restrictions Weight Bearing Restrictions: Yes RLE Weight Bearing: Weight bearing as tolerated LLE Weight Bearing: Weight bearing as tolerated LLE Partial Weight Bearing Percentage or Pounds: Postop Ortho Shoe General:   Vital Signs: Therapy Vitals Temp: (!) 97.5 F (36.4 C) Pulse Rate: 76 Resp: 20 BP: (!) 111/57 Patient Position (if appropriate): Sitting Oxygen Therapy SpO2: 99 % O2 Device: Room Air Pain:   ADL: ADL Grooming: Supervision/safety Where Assessed-Grooming: Sitting at sink Upper Body Bathing: Supervision/safety Where Assessed-Upper Body Bathing: Sitting at sink Lower Body Bathing: Moderate assistance Where Assessed-Lower Body Bathing: Sitting at sink, Standing at sink Upper Body Dressing: Supervision/safety Where Assessed-Upper Body Dressing: Sitting at sink Lower Body Dressing: Maximal assistance Where Assessed-Lower Body Dressing: Sitting at sink, Standing at sink Vision   Perception    Praxis   Exercises:   Other Treatments:     Therapy/Group: Individual Therapy  Alfredia Ferguson [facility] 02/18/2020, 3:26 PM

## 2020-02-18 NOTE — Progress Notes (Signed)
PHYSICAL MEDICINE & REHABILITATION PROGRESS NOTE   Subjective/Complaints:  No issues overnite  ROS: Denies CP, SOB, N/V/D  Objective:   No results found. Recent Labs    02/15/20 1042 02/17/20 0634  WBC 9.9 9.7  HGB 8.6* 8.3*  HCT 28.1* 27.9*  PLT PLATELET CLUMPS NOTED ON SMEAR, UNABLE TO ESTIMATE PLATELET CLUMPS NOTED ON SMEAR, UNABLE TO ESTIMATE   Recent Labs    02/17/20 0634  NA 131*  K 4.6  CL 103  CO2 22  GLUCOSE 145*  BUN 27*  CREATININE 1.31*  CALCIUM 8.5*    Intake/Output Summary (Last 24 hours) at 02/18/2020 1245 Last data filed at 02/18/2020 0120 Gross per 24 hour  Intake 840 ml  Output 800 ml  Net 40 ml     Physical Exam: Vital Signs Blood pressure 131/65, pulse 80, temperature 97.6 F (36.4 C), resp. rate 17, weight 77.1 kg, SpO2 95 %.  General: No acute distress Mood and affect are appropriate Heart: Regular rate and rhythm no rubs murmurs or extra sounds Lungs: Clear to auscultation, breathing unlabored, no rales or wheezes Abdomen: Positive bowel sounds, soft nontender to palpation, nondistended Extremities: No clubbing, cyanosis, or edema Skin: No evidence of breakdown, no evidence of rash  Neuro: Alert Motor: 4+/5 throughout, except left foot, unchanged Bilateral upper extremity resting tremors, stable  Assessment/Plan: 1. Functional deficits secondary to acute metabolic encephalopathy/drug overdose and hx of multiple CVAs which require 3+ hours per day of interdisciplinary therapy in a comprehensive inpatient rehab setting.  Physiatrist is providing close team supervision and 24 hour management of active medical problems listed below.  Physiatrist and rehab team continue to assess barriers to discharge/monitor patient progress toward functional and medical goals  Care Tool:  Bathing    Body parts bathed by patient: Right arm, Left arm, Chest, Abdomen, Right upper leg, Left upper leg, Face, Front perineal area, Buttocks,  Right lower leg   Body parts bathed by helper: Right lower leg, Buttocks Body parts n/a: Left lower leg   Bathing assist Assist Level: Contact Guard/Touching assist     Upper Body Dressing/Undressing Upper body dressing   What is the patient wearing?: Pull over shirt    Upper body assist Assist Level: Set up assist    Lower Body Dressing/Undressing Lower body dressing      What is the patient wearing?: Pants     Lower body assist Assist for lower body dressing: Contact Guard/Touching assist     Toileting Toileting    Toileting assist Assist for toileting: Contact Guard/Touching assist     Transfers Chair/bed transfer  Transfers assist     Chair/bed transfer assist level: Contact Guard/Touching assist     Locomotion Ambulation   Ambulation assist   Ambulation activity did not occur: Safety/medical concerns  Assist level: Contact Guard/Touching assist Assistive device: Walker-rolling Max distance: 150   Walk 10 feet activity   Assist  Walk 10 feet activity did not occur: Safety/medical concerns  Assist level: Contact Guard/Touching assist Assistive device: Walker-rolling   Walk 50 feet activity   Assist Walk 50 feet with 2 turns activity did not occur: Safety/medical concerns  Assist level: Contact Guard/Touching assist Assistive device: Walker-rolling    Walk 150 feet activity   Assist Walk 150 feet activity did not occur: Safety/medical concerns  Assist level: Contact Guard/Touching assist Assistive device: Walker-rolling    Walk 10 feet on uneven surface  activity   Assist Walk 10 feet on uneven surfaces activity did  not occur: Safety/medical concerns         Wheelchair     Assist Will patient use wheelchair at discharge?: No Type of Wheelchair: Manual    Wheelchair assist level: Supervision/Verbal cueing Max wheelchair distance: 68ft    Wheelchair 50 feet with 2 turns activity    Assist        Assist  Level: Supervision/Verbal cueing   Wheelchair 150 feet activity     Assist      Assist Level: Maximal Assistance - Patient 25 - 49%   Blood pressure 131/65, pulse 80, temperature 97.6 F (36.4 C), resp. rate 17, weight 77.1 kg, SpO2 95 %.  Medical Problem List and Plan: 1.  Altered mental status secondary to acute metabolic encephalopathy related to possible drug overdose as well as history of multiple CVAs/vascular dementia  Continue CIR 2.  Antithrombotics: -DVT/anticoagulation: Eliquis             -antiplatelet therapy: Aspirin 81 mg daily, Plavix 75 mg daily 3. Pain Management: Neurontin 200 mg 3 times daily, Cymbalta 20 mg daily.   Relatively controlled with meds on 9/18 4. Mood: Provide emotional support             -antipsychotic agents: Haldol 1 mg BID, Zyprexa 12.5 mg nightly  He was seen by psychiatry, notes reviewed-continue supportive care. 5. Neuropsych: This patient is not fully capable of making decisions on his own behalf. 6. Skin/Wound Care: Routine skin checks  DCed Prevalon boot, ordered PRAFO with benefit  Discussed offloading the sacral area 7. Fluids/Electrolytes/Nutrition: Routine in and outs with follow-up chemistries 8.  Atrial fibrillation.  Amiodarone 400 mg daily. Vitals:   02/17/20 1929 02/18/20 0428  BP: (!) 118/58 131/65  Pulse: 84 80  Resp: 17 17  Temp: 98.5 F (36.9 C) 97.6 F (36.4 C)  SpO2: 94% 95%  HR controlled 9.  Chronic left hallux osteomyelitis.  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 Caroline More.  Weightbearing as tolerated through left heel only with postoperative shoe  Needs cues to remember WB precautions 10.  Hypertension.  Monitor with increased mobility.   Controlled on 9/16 11.  Diabetes mellitus with peripheral neuropathy.  Hemoglobin A1c 7.9.    NovoLog 6 units 3 times daily with meals, increased to 9 3 times daily on 9/9, increased to 11 3 times daily on 9/14  Diabetic teaching.   Increased Lantus to  14U on 9/7.   CBG (last 3)  Recent Labs    02/17/20 1627 02/17/20 2048 02/18/20 0559  GLUCAP 194* 122* 162*   Fair/good control 9/18 12.  CKD stage III as well as bouts of hyperkalemia.  Admission creatinine 1.59.  Continue Lokelma 5 mg twice daily.  Creatinine 1.31 on 9/17  Encourage fluids 13.  Hyperlipidemia.  Lipitor 14.  CAD with history of pacemaker.  Continue aspirin 15.  Chronic systolic congestive heart failure.  Latest ejection fraction 40 to 45%.  Monitor for any signs of fluid overload   Filed Weights   02/17/20 0700 02/17/20 1315 02/18/20 0428  Weight: 80.3 kg 78.4 kg 77.1 kg   ?Reliability on 9/17 16.  Urinary retention.    Increased Flomax to 0.8 mg daily.  17. Orthostatic hypotension: abdominal binder and TEDs ordered  Improving 18.  Thrombocytopenia  Platelets 64 on 9/10 repeat with clumps again, discussed with lab - plan for redraw with citrate  Discussed with pharmacy - no obvious offending agents  Will monitor closely and may need to adjust  anticoagulation due to increased risk of bleeding  Continue to monitor 19.  Chronic hyponatremia  Sodium 131 on 9/17 20. UTI  resolved  Completed course of Keflex  21. Acute blood loss anemia- no tachycardia or dizziness  Hemoglobin 8.3 on 9/17 22. Constipation: prune juice with meals.    LOS: 15 days A FACE TO FACE EVALUATION WAS PERFORMED  Charlett Blake 02/18/2020, 7:12 AM

## 2020-02-19 LAB — GLUCOSE, CAPILLARY
Glucose-Capillary: 115 mg/dL — ABNORMAL HIGH (ref 70–99)
Glucose-Capillary: 126 mg/dL — ABNORMAL HIGH (ref 70–99)
Glucose-Capillary: 201 mg/dL — ABNORMAL HIGH (ref 70–99)
Glucose-Capillary: 165 mg/dL — ABNORMAL HIGH (ref 70–99)

## 2020-02-19 NOTE — Progress Notes (Signed)
PHYSICAL MEDICINE & REHABILITATION PROGRESS NOTE   Subjective/Complaints:  No issues overnite  ROS: Denies CP, SOB, N/V/D  Objective:   No results found. Recent Labs    02/17/20 0634  WBC 9.7  HGB 8.3*  HCT 27.9*  PLT PLATELET CLUMPS NOTED ON SMEAR, UNABLE TO ESTIMATE   Recent Labs    02/17/20 0634  NA 131*  K 4.6  CL 103  CO2 22  GLUCOSE 145*  BUN 27*  CREATININE 1.31*  CALCIUM 8.5*    Intake/Output Summary (Last 24 hours) at 02/19/2020 0747 Last data filed at 02/19/2020 0353 Gross per 24 hour  Intake 942 ml  Output 1397 ml  Net -455 ml     Physical Exam: Vital Signs Blood pressure 116/60, pulse 87, temperature 98.9 F (37.2 C), resp. rate 17, weight 77.9 kg, SpO2 97 %.   General: No acute distress Mood and affect are appropriate Heart: Regular rate and rhythm no rubs murmurs or extra sounds Lungs: Clear to auscultation, breathing unlabored, no rales or wheezes Abdomen: Positive bowel sounds, soft nontender to palpation, nondistended Extremities: No clubbing, cyanosis, or edema Skin: No evidence of breakdown, no evidence of rash Neurologic: ataxic RUE  MSK RIght midfoot amp Neuro: Alert Motor: 4+/5 throughout, except left foot, unchanged Bilateral upper extremity resting tremors, stable  Assessment/Plan: 1. Functional deficits secondary to acute metabolic encephalopathy/drug overdose and hx of multiple CVAs which require 3+ hours per day of interdisciplinary therapy in a comprehensive inpatient rehab setting.  Physiatrist is providing close team supervision and 24 hour management of active medical problems listed below.  Physiatrist and rehab team continue to assess barriers to discharge/monitor patient progress toward functional and medical goals  Care Tool:  Bathing    Body parts bathed by patient: Right arm, Left arm, Chest, Abdomen, Right upper leg, Left upper leg, Face, Front perineal area, Buttocks, Right lower leg   Body parts  bathed by helper: Right lower leg, Buttocks Body parts n/a: Left lower leg   Bathing assist Assist Level: Contact Guard/Touching assist     Upper Body Dressing/Undressing Upper body dressing   What is the patient wearing?: Pull over shirt    Upper body assist Assist Level: Set up assist    Lower Body Dressing/Undressing Lower body dressing      What is the patient wearing?: Pants     Lower body assist Assist for lower body dressing: Contact Guard/Touching assist     Toileting Toileting    Toileting assist Assist for toileting: Contact Guard/Touching assist     Transfers Chair/bed transfer  Transfers assist     Chair/bed transfer assist level: Contact Guard/Touching assist     Locomotion Ambulation   Ambulation assist   Ambulation activity did not occur: Safety/medical concerns  Assist level: Contact Guard/Touching assist Assistive device: Walker-rolling Max distance: 150   Walk 10 feet activity   Assist  Walk 10 feet activity did not occur: Safety/medical concerns  Assist level: Contact Guard/Touching assist Assistive device: Walker-rolling   Walk 50 feet activity   Assist Walk 50 feet with 2 turns activity did not occur: Safety/medical concerns  Assist level: Contact Guard/Touching assist Assistive device: Walker-rolling    Walk 150 feet activity   Assist Walk 150 feet activity did not occur: Safety/medical concerns  Assist level: Contact Guard/Touching assist Assistive device: Walker-rolling    Walk 10 feet on uneven surface  activity   Assist Walk 10 feet on uneven surfaces activity did not occur: Safety/medical concerns  Wheelchair     Assist Will patient use wheelchair at discharge?: No Type of Wheelchair: Manual    Wheelchair assist level: Supervision/Verbal cueing Max wheelchair distance: 70ft    Wheelchair 50 feet with 2 turns activity    Assist        Assist Level: Supervision/Verbal cueing    Wheelchair 150 feet activity     Assist      Assist Level: Maximal Assistance - Patient 25 - 49%   Blood pressure 116/60, pulse 87, temperature 98.9 F (37.2 C), resp. rate 17, weight 77.9 kg, SpO2 97 %.  Medical Problem List and Plan: 1.  Altered mental status secondary to acute metabolic encephalopathy related to possible drug overdose as well as history of multiple CVAs/vascular dementia  Continue CIR PT, OT, SLP 2.  Antithrombotics: -DVT/anticoagulation: Eliquis             -antiplatelet therapy: Aspirin 81 mg daily, Plavix 75 mg daily 3. Pain Management: Neurontin 200 mg 3 times daily, Cymbalta 20 mg daily.   Relatively controlled with meds on 9/18 4. Mood: Provide emotional support             -antipsychotic agents: Haldol 1 mg BID, Zyprexa 12.5 mg nightly  He was seen by psychiatry, notes reviewed-continue supportive care. 5. Neuropsych: This patient is not fully capable of making decisions on his own behalf. 6. Skin/Wound Care: Routine skin checks  DCed Prevalon boot, ordered PRAFO with benefit  Discussed offloading the sacral area 7. Fluids/Electrolytes/Nutrition: Routine in and outs with follow-up chemistries 8.  Atrial fibrillation.  Amiodarone 400 mg daily. Vitals:   02/18/20 1937 02/19/20 0349  BP: (!) 112/59 116/60  Pulse: 79 87  Resp: 17 17  Temp: 98.4 F (36.9 C) 98.9 F (37.2 C)  SpO2: 97% 97%  HR controlled 9.  Chronic left hallux osteomyelitis.  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 Caroline More.  Weightbearing as tolerated through left heel only with postoperative shoe  Needs cues to remember WB precautions 10.  Hypertension.  Monitor with increased mobility.   Controlled on 9/16 11.  Diabetes mellitus with peripheral neuropathy.  Hemoglobin A1c 7.9.    NovoLog 6 units 3 times daily with meals, increased to 9 3 times daily on 9/9, increased to 11 3 times daily on 9/14  Diabetic teaching.   Increased Lantus to 14U on 9/7.   CBG  (last 3)  Recent Labs    02/18/20 1643 02/18/20 2105 02/19/20 0617  GLUCAP 239* 207* 115*   Fair/good control 9/18 12.  CKD stage III as well as bouts of hyperkalemia.  Admission creatinine 1.59.  Continue Lokelma 5 mg twice daily.  Creatinine 1.31 on 9/17  Encourage fluids 13.  Hyperlipidemia.  Lipitor 14.  CAD with history of pacemaker.  Continue aspirin 15.  Chronic systolic congestive heart failure.  Latest ejection fraction 40 to 45%.  Monitor for any signs of fluid overload   Filed Weights   02/17/20 1315 02/18/20 0428 02/19/20 0349  Weight: 78.4 kg 77.1 kg 77.9 kg   ?Reliability on 9/17 16.  Urinary retention.    Increased Flomax to 0.8 mg daily.  17. Orthostatic hypotension: abdominal binder and TEDs ordered  Improving 18.  Thrombocytopenia  Platelets 64 on 9/10 repeat with clumps again, discussed with lab - plan for redraw with citrate  Discussed with pharmacy - no obvious offending agents  Noovert  bleeding hgb stable  19.  Chronic hyponatremia  Sodium 131 on 9/17 20. UTI  resolved  Completed course of Keflex  21. Acute blood loss anemia- no tachycardia or dizziness  Hemoglobin 8.3 on 9/17 22. Constipation: prune juice with meals.    LOS: 16 days A FACE TO FACE EVALUATION WAS PERFORMED  Charlett Blake 02/19/2020, 7:47 AM

## 2020-02-20 ENCOUNTER — Inpatient Hospital Stay (HOSPITAL_COMMUNITY): Payer: Medicare Other

## 2020-02-20 ENCOUNTER — Inpatient Hospital Stay (HOSPITAL_COMMUNITY): Payer: Medicare Other | Admitting: Occupational Therapy

## 2020-02-20 LAB — GLUCOSE, CAPILLARY
Glucose-Capillary: 158 mg/dL — ABNORMAL HIGH (ref 70–99)
Glucose-Capillary: 160 mg/dL — ABNORMAL HIGH (ref 70–99)
Glucose-Capillary: 188 mg/dL — ABNORMAL HIGH (ref 70–99)
Glucose-Capillary: 135 mg/dL — ABNORMAL HIGH (ref 70–99)

## 2020-02-20 NOTE — Progress Notes (Signed)
Occupational Therapy Weekly Progress Note  Patient Details  Name: Jason Montes MRN: [id number] Date of Birth: 10-10-1950  Beginning of progress report period: February 11, 2020 End of progress report period: February 20, 2020  Today's Date: 02/20/2020 OT Individual Time: 1137-1207 and 1300-1400 OT Individual Time Calculation (min): 30 min and 60 min   Patient has met 4 of 4 short term goals.  Pt is making steady progress towards goals.  Pt is currently able to complete functional transfers with RW with CGA to min assist. Pt is able to complete bathing and dressing at [place], continues to require min assist with footwear due to [place].  Pt has experience some hypotension which appears to be much improved, allowing pt to demonstrate increased activity tolerance and endurance and increased safety and independence with self-care tasks.    Patient continues to demonstrate the following deficits: muscle weakness, decreased cardiorespiratoy endurance, impaired timing and sequencing, field cut, decreased attention to right, decreased initiation, decreased awareness, decreased problem solving, decreased safety awareness and decreased memory and decreased standing balance, decreased postural control and difficulty maintaining precautions and therefore will continue to benefit from skilled OT intervention to enhance overall performance with BADL and Reduce care partner burden.  Patient progressing toward long term goals..  Continue plan of care.  OT Short Term Goals Week 2:  OT Short Term Goal 1 (Week 2): Pt will perform toilet transfers with Min A OT Short Term Goal 1 - Progress (Week 2): Met OT Short Term Goal 2 (Week 2): Pt will perform LB dress with AE PRN with Min A OT Short Term Goal 2 - Progress (Week 2): Met OT Short Term Goal 3 (Week 2): Pt will don post surgical shoe and R regular shoe with Min A OT Short Term Goal 3 - Progress (Week 2): Met OT Short Term Goal 4 (Week 2): Pt will  maintain standing with CGA for 2 mins to complete functional task OT Short Term Goal 4 - Progress (Week 2): Met Week 3:  OT Short Term Goal 1 (Week 3): STG = LTGs due to remaining LOS  Skilled Therapeutic Interventions/Progress Updates:    1) Treatment session with focus on functional mobility and d/c planning.  Pt received upright in w/c reporting desire to go outside during therapy session. Therapist pushed w/c on/off elevator and through doorways, while educating on technique to increase success with mobility in community environment.  Engaged in session outside hospital entrance to allow for therapeutic use of setting while discussing assistance he has at home.  Pt reports having walk-in shower stall, shower seat, and caregiver who assists with setup for bathing/dressing and completes household tasks for him such as laundry and shopping tasks.  Pt states caregiver comes every day.  Discussed current goals and progress towards goals.  Pt pleased with opportunity to go outside and pleased with progress.  Pt remained upright in w/c with seat belt alarm on and all needs in reach.  2) Treatment session with focus on self-care retraining and functional transfers.  Pt received upright in w/c agreeable to shower this session.  Pt ambulated to room shower with RW with CGA and min cues for sequencing transfer to bench in shower.  Pt completed bathing supervision when seated and CGA when washing buttocks in standing.  Completed stand pivot transfer to w/c post shower due to wet floor and not wearing off-loading shoe.  Pt completed LB dressing with CGA when standing to pull pants over hips, did require assistance  with donning TED stocking to RLE and donning [place] shoe.  Pt remained upright in w/c with seat belt alarm on and all needs in reach.  Therapy Documentation Precautions:  Precautions Precautions: Fall, Other (comment) Precaution Comments: LLE WBAT heel only with post-op shoe Required Braces or  Orthoses: Other Brace Other Brace: postop shoe Restrictions Weight Bearing Restrictions: Yes RLE Weight Bearing: Weight bearing as tolerated LLE Weight Bearing: Weight bearing as tolerated LLE Partial Weight Bearing Percentage or Pounds: Postop Ortho Shoe General:   Vital Signs:   Pain: Pain Assessment Pain Scale: 0-10 Pain Score: 0-No pain ADL: ADL Grooming: Supervision/safety Where Assessed-Grooming: Sitting at sink Upper Body Bathing: Supervision/safety Where Assessed-Upper Body Bathing: Sitting at sink Lower Body Bathing: Moderate assistance Where Assessed-Lower Body Bathing: Sitting at sink, Standing at sink Upper Body Dressing: Supervision/safety Where Assessed-Upper Body Dressing: Sitting at sink Lower Body Dressing: Maximal assistance Where Assessed-Lower Body Dressing: Sitting at sink, Standing at sink Vision   Perception    Praxis   Exercises:   Other Treatments:     Therapy/Group: Individual Therapy  [name] 02/20/2020, 10:29 AM

## 2020-02-20 NOTE — Progress Notes (Signed)
[place] PHYSICAL MEDICINE & REHABILITATION PROGRESS NOTE   Subjective/Complaints: No complaints  ROS: Denies CP, SOB, N/V/D  Objective:   No results found. No results for input(s): WBC, HGB, HCT, PLT in the last 72 hours. No results for input(s): NA, K, CL, CO2, GLUCOSE, BUN, CREATININE, CALCIUM in the last 72 hours.  Intake/Output Summary (Last 24 hours) at 02/20/2020 1256 Last data filed at 02/20/2020 0900 Gross per 24 hour  Intake 800 ml  Output 1825 ml  Net -1025 ml     Physical Exam: Vital Signs Blood pressure 125/72, pulse 84, temperature 97.6 F (36.4 C), temperature source Oral, resp. rate 18, weight 77.9 kg, SpO2 93 %. General: Alert and oriented x 3, No apparent distress HEENT: Head is normocephalic, atraumatic, PERRLA, EOMI, sclera anicteric, oral mucosa pink and moist, dentition intact, ext ear canals clear,  Neck: Supple without JVD or lymphadenopathy Heart: Reg rate and rhythm. No murmurs rubs or gallops Chest: CTA bilaterally without wheezes, rales, or rhonchi; no distress Abdomen: Soft, non-tender, non-distended, bowel sounds positive. Extremities: No clubbing, cyanosis, or edema. Pulses are 2+ Skin: Clean and intact without signs of breakdown Neurologic: ataxic RUE MSK RIght midfoot amp Neuro: Alert Motor: 4+/5 throughout, except left foot, unchanged Bilateral upper extremity resting tremors, stable   Assessment/Plan: 1. Functional deficits secondary to acute metabolic encephalopathy/drug overdose and hx of multiple CVAs which require 3+ hours per day of interdisciplinary therapy in a comprehensive inpatient rehab setting.  Physiatrist is providing close team supervision and 24 hour management of active medical problems listed below.  Physiatrist and rehab team continue to assess barriers to discharge/monitor patient progress toward functional and medical goals  Care Tool:  Bathing    Body parts bathed by patient: Right arm, Left arm, Chest,  Abdomen, Right upper leg, Left upper leg, Face, Front perineal area, Buttocks, Right lower leg   Body parts bathed by helper: Right lower leg, Buttocks Body parts n/a: Left lower leg   Bathing assist Assist Level: Contact Guard/Touching assist     Upper Body Dressing/Undressing Upper body dressing   What is the patient wearing?: Pull over shirt    Upper body assist Assist Level: Set up assist    Lower Body Dressing/Undressing Lower body dressing      What is the patient wearing?: Pants     Lower body assist Assist for lower body dressing: Contact Guard/Touching assist     Toileting Toileting    Toileting assist Assist for toileting: Contact Guard/Touching assist     Transfers Chair/bed transfer  Transfers assist     Chair/bed transfer assist level: Contact Guard/Touching assist     Locomotion Ambulation   Ambulation assist   Ambulation activity did not occur: Safety/medical concerns  Assist level: Contact Guard/Touching assist Assistive device: Walker-rolling Max distance: 150   Walk 10 feet activity   Assist  Walk 10 feet activity did not occur: Safety/medical concerns  Assist level: Contact Guard/Touching assist Assistive device: Walker-rolling   Walk 50 feet activity   Assist Walk 50 feet with 2 turns activity did not occur: Safety/medical concerns  Assist level: Contact Guard/Touching assist Assistive device: Walker-rolling    Walk 150 feet activity   Assist Walk 150 feet activity did not occur: Safety/medical concerns  Assist level: Contact Guard/Touching assist Assistive device: Walker-rolling    Walk 10 feet on uneven surface  activity   Assist Walk 10 feet on uneven surfaces activity did not occur: Safety/medical concerns  Wheelchair     Assist Will patient use wheelchair at discharge?: No Type of Wheelchair: Manual    Wheelchair assist level: Supervision/Verbal cueing Max wheelchair distance: 58ft     Wheelchair 50 feet with 2 turns activity    Assist        Assist Level: Supervision/Verbal cueing   Wheelchair 150 feet activity     Assist      Assist Level: Maximal Assistance - Patient 25 - 49%   Blood pressure 125/72, pulse 84, temperature 97.6 F (36.4 C), temperature source Oral, resp. rate 18, weight 77.9 kg, SpO2 93 %.  Medical Problem List and Plan: 1.  Altered mental status secondary to acute metabolic encephalopathy related to possible drug overdose as well as history of multiple CVAs/vascular dementia  Continue CIR PT, OT, SLP 2.  Antithrombotics: -DVT/anticoagulation: Eliquis             -antiplatelet therapy: Aspirin 81 mg daily, Plavix 75 mg daily 3. Pain Management: Neurontin 200 mg 3 times daily, Cymbalta 20 mg daily.   Well controlled 9/20 4. Mood: Provide emotional support             -antipsychotic agents: Haldol 1 mg BID, Zyprexa 12.5 mg nightly  He was seen by psychiatry, notes reviewed-continue supportive care. 5. Neuropsych: This patient is not fully capable of making decisions on his own behalf. 6. Skin/Wound Care: Routine skin checks  DCed Prevalon boot, ordered PRAFO with benefit  Discussed offloading the sacral area 7. Fluids/Electrolytes/Nutrition: Routine in and outs with follow-up chemistries 8.  Atrial fibrillation.  Amiodarone 400 mg daily. Vitals:   02/19/20 1931 02/20/20 0321  BP: (!) 115/59 125/72  Pulse: 79 84  Resp: 18 18  Temp: 98.9 F (37.2 C) 97.6 F (36.4 C)  SpO2: 97% 93%   HR controlled 9.  Chronic left hallux osteomyelitis.  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 Caroline More.  Weightbearing as tolerated through left heel only with postoperative shoe  Needs cues to remember WB precautions 10.  Hypertension.  Monitor with increased mobility.   Well controlled 9/20 11.  Diabetes mellitus with peripheral neuropathy.  Hemoglobin A1c 7.9.    NovoLog 6 units 3 times daily with meals, increased to 9 3  times daily on 9/9, increased to 11 3 times daily on 9/14  Diabetic teaching.   Increased Lantus to 14U on 9/7.   CBG (last 3)  Recent Labs    02/19/20 2105 02/20/20 0545 02/20/20 1132  GLUCAP 165* 158* 160*   Fair/good control 9/18 12.  CKD stage III as well as bouts of hyperkalemia.  Admission creatinine 1.59.  Continue Lokelma 5 mg twice daily.  Creatinine 1.31 on 9/17  Encourage fluids 13.  Hyperlipidemia.  Lipitor 14.  CAD with history of pacemaker.  Continue aspirin 15.  Chronic systolic congestive heart failure.  Latest ejection fraction 40 to 45%.  Monitor for any signs of fluid overload   Filed Weights   02/17/20 1315 02/18/20 0428 02/19/20 0349  Weight: 78.4 kg 77.1 kg 77.9 kg   ?Reliability on 9/17 16.  Urinary retention.    Increased Flomax to 0.8 mg daily.  17. Orthostatic hypotension: abdominal binder and TEDs ordered  Improving 18.  Thrombocytopenia  Platelets 64 on 9/10 repeat with clumps again, discussed with lab - plan for redraw with citrate  Discussed with pharmacy - no obvious offending agents  Noovert  bleeding hgb stable  19.  Chronic hyponatremia  Sodium 131 on 9/17 20.  UTI  resolved  Completed course of Keflex  21. Acute blood loss anemia- no tachycardia or dizziness  Hemoglobin 8.3 on 9/17 22. Constipation: prune juice with meals.    LOS: 17 days A FACE TO FACE EVALUATION WAS PERFORMED  Clide Deutscher Yumna Ebers 02/20/2020, 12:56 PM

## 2020-02-20 NOTE — Progress Notes (Signed)
Physical Therapy Session Note  Patient Details  Name: Jason Montes MRN: [id number] Date of Birth: [birth year]  Today's Date: 02/20/2020 PT Individual Time: 380 137 6730 and 4099-8884 PT Individual Time Calculation (min): 53 min and 27 min  Short Term Goals: Week 2:  PT Short Term Goal 1 (Week 2): pt will perform all bed mobility with CGA PT Short Term Goal 1 - Progress (Week 2): Progressing toward goal PT Short Term Goal 2 (Week 2): pt will transfer sit<>stand with LRAD and min A consistantly PT Short Term Goal 2 - Progress (Week 2): Met PT Short Term Goal 3 (Week 2): Pt will ambulate 58ft with LRAD CGA PT Short Term Goal 3 - Progress (Week 2): Met Week 3:  PT Short Term Goal 1 (Week 3): pt will perform all bed mobility with CGA consistantly PT Short Term Goal 2 (Week 3): pt will transfer sit<>stand with LRAD and CGA consistantly PT Short Term Goal 3 (Week 3): Pt will ambulate 157ft with LRAD CGA  Skilled Therapeutic Interventions/Progress Updates:   Treatment Session 1: 0800-0853 53 min Received pt supine in bed, pt agreeable to therapy, and reported pain in low back from lying in bed, but reported relief after getting OOB. Session with emphasis on functional mobility/transfers, dressing, generalized strengthening, dynamic standing balance/coordination, ambulation, and improved activity tolerance. Doffed L PRAFO boot and donned R ted hose, non-skid sock, and L Darco shoe total A. Pt transferred supine<>sitting EOB with supervision and use of bedrails from flat bed and donned clean pull over shirt with CGA and R shoe total A. Donned pants sitting EOB with mod A and transferred sit<>stand with RW CGA from elevated bed and requested therapist assist with pulling pants over hips stating "my balance is thrown off". Stand<>pivot bed<>WC with RW CGA and cues for positioning. Pt sat in [facility] and brushed teeth, washed face, and applied deodorant with supervision and min cues for visual scanning to  locate objects on countertop. Pt transported to therapy gym in [employer] total A for time management purposes and transferred sit<>stand with RW CGA and began to ambulate but reported increased dizziness/lightheadedness and therapist suggested pt sit.  BP sitting: 116/58 HR 82bpm; pt denied dizziness but reported increased blurry vision. BP standing: 102/52 and HR 89bpm; pt stated "I don't feel good, I guess it's dizziness" Pt motivated to continue with therapy stating "If I tell you I feeling dizzy then you won't let me walk." Therapist educated pt on importance of recognizing signs/symptoms of dizziness and importance of taking rest breaks for safety and fall prevention. Pt ambulated 75ft with RW CGA and reported increased "darkness" with vision and stated "I'd better sit down." pt reported he had not been OOB much yesterday which could contribute to increased dizziness today. Pt transported back to room in [facility] total A. Concluded session with pt sitting in WC, needs within reach, and seatbelt alarm on. Memory book updated.    Treatment Session 2: 1500-1527 27 min Received pt sitting in WC, pt agreeable to therapy, and denied any pain during session. L Darco shoe donned during session. Session with emphasis on functional mobility/transfers, generalized strengthening, dynamic standing balance/coordination, ambulation, stair navigation, and improved activity tolerance. Pt transported to therapy gym in [facility] total A and navigated 4 steps with L handrail min A using a lateral stepping technique ascending and descending with a step to pattern. Pt required min cues for foot placement on step. Discussed stair navigation at home and therapist suggested pt have assist to move  RW up/down stairs instead of trying to navigate steps and hold RW; pt agreed. Pt ambulated 137ft with RW and CGA back to room. Pt continues to demonstrate narrow BOS, decreased weight shifting to L, decreased trunk rotation, and decreased bilateral foot  clearance. Pt transferred sit<>stand with RW x 6 reps with CGA and cues for anterior weight shifting. Concluded session with pt sitting in WC, needs within reach, and seatbelt alarm on.   Therapy Documentation Precautions:  Precautions Precautions: Fall, Other (comment) Precaution Comments: LLE WBAT heel only with post-op shoe Required Braces or Orthoses: Other Brace Other Brace: postop shoe Restrictions Weight Bearing Restrictions: Yes RLE Weight Bearing: Weight bearing as tolerated LLE Weight Bearing: Weight bearing as tolerated LLE Partial Weight Bearing Percentage or Pounds: Postop Ortho Shoe   Therapy/Group: Individual Therapy [name] PT, DPT   02/20/2020, 7:18 AM

## 2020-02-21 ENCOUNTER — Inpatient Hospital Stay (HOSPITAL_COMMUNITY): Payer: Medicare Other | Admitting: Occupational Therapy

## 2020-02-21 ENCOUNTER — Inpatient Hospital Stay (HOSPITAL_COMMUNITY): Payer: Medicare Other

## 2020-02-21 LAB — GLUCOSE, CAPILLARY
Glucose-Capillary: 82 mg/dL (ref 70–99)
Glucose-Capillary: 95 mg/dL (ref 70–99)
Glucose-Capillary: 241 mg/dL — ABNORMAL HIGH (ref 70–99)
Glucose-Capillary: 239 mg/dL — ABNORMAL HIGH (ref 70–99)

## 2020-02-21 NOTE — Progress Notes (Signed)
Physical Therapy Session Note  Patient Details  Name: Jason Montes MRN: [id number] Date of Birth: 01/02/1951  Today's Date: 02/21/2020 PT Individual Time: 1000-1057 and 1345-1454 PT Individual Time Calculation (min): 57 min and 69 min  Short Term Goals: Week 3:  PT Short Term Goal 1 (Week 3): pt will perform all bed mobility with CGA consistantly PT Short Term Goal 1 - Progress (Week 3): Met PT Short Term Goal 2 (Week 3): pt will transfer sit<>stand with LRAD and CGA consistantly PT Short Term Goal 2 - Progress (Week 3): Met PT Short Term Goal 3 (Week 3): Pt will ambulate 157f with LRAD CGA PT Short Term Goal 3 - Progress (Week 3): Met Week 4:  PT Short Term Goal 1 (Week 4): STG=LTG due to LOS  Skilled Therapeutic Interventions/Progress Updates:  Ambulation/gait training;Cognitive remediation/compensation;Discharge planning;DME/adaptive equipment instruction;Functional mobility training;Pain management;Psychosocial support;Splinting/orthotics;Therapeutic Activities;UE/LE Strength taining/ROM;Visual/perceptual remediation/compensation;Balance/vestibular training;Community reintegration;Disease management/prevention;Functional electrical stimulation;Neuromuscular re-education;Patient/family education;Skin care/wound management;Stair training;Therapeutic Exercise;UE/LE Coordination activities;Wheelchair propulsion/positioning   Today's Interventions: Treatment Session 1: [id number] min Received pt sitting in WC, pt agreeable to therapy, and denied any pain during session. Session with emphasis on functional mobility/transfers, generalized strengthening, dynamic standing balance/coordination, ambulation, stair navigation, and improved activity tolerance. L Darco shoe donned throughout session. [facility] and navigated 4 steps with L handrail using a lateral stepping technique min A ascending and descending with a step to pattern. Pt  with increased difficulty spacing feet properly on step resulting in stepping on top of his feet. Pt required cues for WB status and foot placement on step but reported difficulty due to visual deficits. Pt ambulated 1533fx 1 and 2073f 1 with RW and CGA with 5 standing rest breaks throughout. Pt with narrow BOS, decreased bilateral foot clearance, and decreased stride length requiring cues to increase step length. Pt performed the following exercises sitting on mat with supervision and verbal cues for technique: -overhead shoulder press with 3lb dowel x10 -horizontal chest press with 3lb dowel x10  -trunk rotation "rowing boat" with 3lb dowel x10 bilaterally -LAQ with 2lb ankle weight 2x10 bilaterally -hip flexion with 2lb ankle weight 2x10 bilaterally  -hip adduction ball squeezes 2x10 with 3 second isometric hold Pt transferred sit<>stand with RW x8 and x5 reps from elevated mat with CGA and cues to push up from mat for safety, however pt preferred to push up on RW. Pt required multiple rest breaks throughout session due to increased fatigue and decreased cardiovascular endurance. Pt transferred mat<>WC stand<>pivot with RW CGA and [facility]. Concluded session with pt sitting in WC, needs within reach, and seatbelt alarm on. Therapist provided fresh drink for pt.   Treatment Session 2: [id number] 34n Received pt sitting in WC, pt agreeable to therapy, and denied any pain during session but reported fatigue from previous therapy sessions. Session with emphasis on functional mobility/transfers, generalized strengthening, dynamic standing balance/coordination, ambulation, simulated car transfers, and improved activity tolerance. L Darco shoe donned throughout session. Pt transported downstairs to giftshop in [facility] and ambulated [id number] RW CGA throuhgout giftshop with emphasis on obstacle navigation and community navigation. Pt with difficulty avoiding running into  obstacles on R due to visual deficits. [facility] and performed simulated car transfer with RW and CGA and required extensive rest break afterwards. [facility] and performed  bilateral LE strengthening on Kinetron at 20 cm/sec for 1 minute x 2 trials with back support and emphasis on quad strengthening. Pt requested to return to room to locate personal items including shirt, pants, and phone. Pt transported back to room in [facility] total A and therapist located pt's shirt and pants but unable to find phone. Pt unable to recall if he had his phone prior to coming onto rehab unit. Pt transported to ortho gym in [facility] total A and performed bilateral UE strengthening on UBE at level 0 for 3 minutes forward and 3 minutes backward for improved cardiovascular endurance. Noticed pt's bandage soiled and pt transported back to room in [facility] total A. Doffed soiled dressing and re-wrapped L toe with non-adherent bandage and guaze total A and RN made aware. Concluded session with pt sitting in WC, needs within reach, and seatbelt alarm on. Therapist provided fresh drink/snack for pt.   Therapy Documentation Precautions:  Precautions Precautions: Fall, Other (comment) Precaution Comments: LLE WBAT heel only with post-op shoe Required Braces or Orthoses: Other Brace Other Brace: postop shoe Restrictions Weight Bearing Restrictions: Yes RLE Weight Bearing: Weight bearing as tolerated LLE Weight Bearing: Weight bearing as tolerated LLE Partial Weight Bearing Percentage or Pounds: Postop Ortho Shoe  Therapy/Group: Individual Therapy [name] PT, DPT   02/21/2020, 7:28 AM

## 2020-02-21 NOTE — Progress Notes (Signed)
Physical Therapy Weekly Progress Note  Patient Details  Name: Jason Montes MRN: [id number] Date of Birth: May 13, 1951  Beginning of progress report period: February 04, 2020 End of progress report period: February 21, 2020  Patient has met 3 of 3 short term goals. Pt demonstrates steady progress towards long term goals. Pt currently requires supervision with use of bedrails for bed mobility, CGA/min A for transfers with RW, CGA to ambulate >138f with RW, min A to navigate 4 steps with L handrail, and supervision for WC mobility up to 813f However, pt continues to demonstrate difficulty maintaining L LE WB precautions, decreased cardiovascular endurance, decreased balance, and cognitive deficits, however demonstrates significant improvements since eval.    Patient continues to demonstrate the following deficits muscle weakness, decreased cardiorespiratoy endurance, impaired timing and sequencing, decreased coordination and decreased motor planning, decreased awareness, decreased problem solving, decreased safety awareness and decreased memory and decreased standing balance, decreased postural control, decreased balance strategies and difficulty maintaining precautions and therefore will continue to benefit from skilled PT intervention to increase functional independence with mobility.  Patient progressing toward long term goals..  Continue plan of care.  PT Short Term Goals Week 3:  PT Short Term Goal 1 (Week 3): pt will perform all bed mobility with CGA consistantly PT Short Term Goal 1 - Progress (Week 3): Met PT Short Term Goal 2 (Week 3): pt will transfer sit<>stand with LRAD and CGA consistantly PT Short Term Goal 2 - Progress (Week 3): Met PT Short Term Goal 3 (Week 3): Pt will ambulate 10041fith LRAD CGA PT Short Term Goal 3 - Progress (Week 3): Met Week 4:  PT Short Term Goal 1 (Week 4): STG=LTG due to LOS  Skilled Therapeutic Interventions/Progress Updates:  Ambulation/gait  training;Cognitive remediation/compensation;Discharge planning;DME/adaptive equipment instruction;Functional mobility training;Pain management;Psychosocial support;Splinting/orthotics;Therapeutic Activities;UE/LE Strength taining/ROM;Visual/perceptual remediation/compensation;Balance/vestibular training;Community reintegration;Disease management/prevention;Functional electrical stimulation;Neuromuscular re-education;Patient/family education;Skin care/wound management;Stair training;Therapeutic Exercise;UE/LE Coordination activities;Wheelchair propulsion/positioning   Therapy Documentation Precautions:  Precautions Precautions: Fall, Other (comment) Precaution Comments: LLE WBAT heel only with post-op shoe Required Braces or Orthoses: Other Brace Other Brace: postop shoe Restrictions Weight Bearing Restrictions: Yes RLE Weight Bearing: Weight bearing as tolerated LLE Weight Bearing: Weight bearing as tolerated LLE Partial Weight Bearing Percentage or Pounds: Postop Ortho Shoe  Therapy/Group: Individual Therapy AnnAlfonse Alpers, DPT  02/21/2020, 7:26 AM

## 2020-02-21 NOTE — Progress Notes (Signed)
[place] PHYSICAL MEDICINE & REHABILITATION PROGRESS NOTE   Subjective/Complaints: Patient seen laying in bed this AM, working with therapies.  Discussed dressing changes with therapies.  He states he slept well overnight.   ROS: Denies CP, SOB, N/V/D  Objective:   No results found. No results for input(s): WBC, HGB, HCT, PLT in the last 72 hours. No results for input(s): NA, K, CL, CO2, GLUCOSE, BUN, CREATININE, CALCIUM in the last 72 hours.  Intake/Output Summary (Last 24 hours) at 02/21/2020 1038 Last data filed at 02/21/2020 0751 Gross per 24 hour  Intake 540 ml  Output 1400 ml  Net -860 ml     Physical Exam: Vital Signs Blood pressure 118/67, pulse 78, temperature 98 F (36.7 C), resp. rate 17, weight 78.2 kg, SpO2 96 %.  Constitutional: No distress . Vital signs reviewed. HENT: Normocephalic.  Atraumatic. Eyes: EOMI. No discharge. Cardiovascular: No JVD.  RRR. Respiratory: Normal effort.  No stridor.  Bilateral clear to auscultation. GI: Non-distended.  BS +. Skin: Left foot with healing incision Left heel healing Psych: Normal mood.  Normal behavior. Musc: Left hallux amputation with edema and tendness Neuro: Alert Motor: 4+/5 throughout B/l UE resting tremors, stable   Assessment/Plan: 1. Functional deficits secondary to acute metabolic encephalopathy/drug overdose and hx of multiple CVAs which require 3+ hours per day of interdisciplinary therapy in a comprehensive inpatient rehab setting.  Physiatrist is providing close team supervision and 24 hour management of active medical problems listed below.  Physiatrist and rehab team continue to assess barriers to discharge/monitor patient progress toward functional and medical goals  Care Tool:  Bathing    Body parts bathed by patient: Right arm, Left arm, Chest, Abdomen, Right upper leg, Left upper leg, Face, Front perineal area, Buttocks, Right lower leg   Body parts bathed by helper: Right lower leg,  Buttocks Body parts n/a: Left lower leg   Bathing assist Assist Level: Contact Guard/Touching assist     Upper Body Dressing/Undressing Upper body dressing   What is the patient wearing?: Pull over shirt    Upper body assist Assist Level: Set up assist    Lower Body Dressing/Undressing Lower body dressing      What is the patient wearing?: Underwear/pull up, Pants     Lower body assist Assist for lower body dressing: Minimal Assistance - Patient > 75%     Toileting Toileting    Toileting assist Assist for toileting: Contact Guard/Touching assist     Transfers Chair/bed transfer  Transfers assist     Chair/bed transfer assist level: Contact Guard/Touching assist     Locomotion Ambulation   Ambulation assist   Ambulation activity did not occur: Safety/medical concerns  Assist level: Contact Guard/Touching assist Assistive device: Walker-rolling Max distance: 135ft   Walk 10 feet activity   Assist  Walk 10 feet activity did not occur: Safety/medical concerns  Assist level: Contact Guard/Touching assist Assistive device: Walker-rolling   Walk 50 feet activity   Assist Walk 50 feet with 2 turns activity did not occur: Safety/medical concerns  Assist level: Contact Guard/Touching assist Assistive device: Walker-rolling    Walk 150 feet activity   Assist Walk 150 feet activity did not occur: Safety/medical concerns  Assist level: Contact Guard/Touching assist Assistive device: Walker-rolling    Walk 10 feet on uneven surface  activity   Assist Walk 10 feet on uneven surfaces activity did not occur: Safety/medical concerns         Wheelchair     Assist Will  patient use wheelchair at discharge?: No Type of Wheelchair: Manual    Wheelchair assist level: Supervision/Verbal cueing Max wheelchair distance: 58ft    Wheelchair 50 feet with 2 turns activity    Assist        Assist Level: Supervision/Verbal cueing    Wheelchair 150 feet activity     Assist      Assist Level: Maximal Assistance - Patient 25 - 49%   Blood pressure 118/67, pulse 78, temperature 98 F (36.7 C), resp. rate 17, weight 78.2 kg, SpO2 96 %.  Medical Problem List and Plan: 1.  Altered mental status secondary to acute metabolic encephalopathy related to possible drug overdose as well as history of multiple CVAs/vascular dementia  Continue CIR 2.  Antithrombotics: -DVT/anticoagulation: Eliquis             -antiplatelet therapy: Aspirin 81 mg daily, Plavix 75 mg daily 3. Pain Management: Neurontin 200 mg 3 times daily, Cymbalta 20 mg daily.   Controlled with meds on 9/21 4. Mood: Provide emotional support             -antipsychotic agents: Haldol 1 mg BID, Zyprexa 12.5 mg nightly  He was seen by psychiatry, notes reviewed-continue supportive care. 5. Neuropsych: This patient is not fully capable of making decisions on his own behalf. 6. Skin/Wound Care: Routine skin checks  DCed Prevalon boot, ordered PRAFO with benefit  Discussed offloading the sacral area  Improving 7. Fluids/Electrolytes/Nutrition: Routine in and outs with follow-up chemistries 8.  Atrial fibrillation.  Amiodarone 400 mg daily. Vitals:   02/20/20 1934 02/21/20 0535  BP: 122/62 118/67  Pulse: 79 78  Resp: 16 17  Temp: 97.9 F (36.6 C) 98 F (36.7 C)  SpO2: 99% 96%   HR controlled on 9/21 9.  Chronic left hallux osteomyelitis.  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 Caroline More.  Weightbearing as tolerated through left heel only with postoperative shoe  Needs cues to remember WB precautions 10.  Hypertension.  Monitor with increased mobility.   Controlled on 9/21 11.  Diabetes mellitus with peripheral neuropathy.  Hemoglobin A1c 7.9.    NovoLog 6 units 3 times daily with meals, increased to 9 3 times daily on 9/9, increased to 11 3 times daily on 9/14  Diabetic teaching.   Increased Lantus to 14U on 9/7.   CBG (last 3)   Recent Labs    02/20/20 1655 02/20/20 2105 02/21/20 0609  GLUCAP 188* 135* 82   Improving on 9/21 12.  CKD stage III as well as bouts of hyperkalemia.  Admission creatinine 1.59.  Continue Lokelma 5 mg twice daily.  Creatinine 1.31 on 9/17  Encourage fluids 13.  Hyperlipidemia.  Lipitor 14.  CAD with history of pacemaker.  Continue aspirin 15.  Chronic systolic congestive heart failure.  Latest ejection fraction 40 to 45%.  Monitor for any signs of fluid overload   Filed Weights   02/18/20 0428 02/19/20 0349 02/21/20 0500  Weight: 77.1 kg 77.9 kg 78.2 kg   ?Trending up on 9/21 16.  Urinary retention.    Increased Flomax to 0.8 mg daily.  17. Orthostatic hypotension: abdominal binder and TEDs ordered  Improving 18.  Thrombocytopenia  Platelets 64 on 9/10 repeat with clumps again, discussed with lab, no improvement with citrate  Discussed with pharmacy - no obvious offending agents 19.  Chronic hyponatremia  Sodium 131 on 9/17 20. UTI  resolved  Completed course of Keflex 21. Acute blood loss anemia  Hemoglobin 8.3  on 9/17 22. Constipation: prune juice with meals.    LOS: 18 days A FACE TO FACE EVALUATION WAS PERFORMED  Jason Montes Lorie Phenix 02/21/2020, 10:38 AM

## 2020-02-21 NOTE — Progress Notes (Signed)
Occupational Therapy Session Note  Patient Details  Name: Jason Montes MRN: [id number] Date of Birth: 1951-02-04  Today's Date: 02/21/2020 OT Individual Time: 4287-6811 OT Individual Time Calculation (min): 57 min    Short Term Goals: Week 3:  OT Short Term Goal 1 (Week 3): STG = LTGs due to remaining LOS  Skilled Therapeutic Interventions/Progress Updates:    Treatment session with focus on functional transfers, sit > stand, dynamic standing balance, and LB dressing.  Pt received semi-reclined in bed reporting having had urinary accident and requesting to shower.  MD arrived for rounds and undressed foot.  Therapist redressed foot per MD orders.  Due to time constraints and need to cover foot for shower, pt opted to bathe at sink this session.  Pt completed bed mobility with CGA and completed stand pivot transfer with RW with CGA.  Engaged in bathing at sit > stand level at sink with CGA during sit > stand and for standing balance when washing LB and completing LB dressing.  Pt able to thread BLE in to pants this session and pull pants over hips while alternating UE support in standing.  Pt required assistance to don Lt Darco shoe, but able to don Rt shoe this session.  Completed oral care in sitting without assistance.  Pt remained upright in w/c with seat belt alarm on and all needs in reach.  Therapy Documentation Precautions:  Precautions Precautions: Fall, Other (comment) Precaution Comments: LLE WBAT heel only with post-op shoe Required Braces or Orthoses: Other Brace Other Brace: postop shoe Restrictions Weight Bearing Restrictions: Yes RLE Weight Bearing: Weight bearing as tolerated LLE Weight Bearing: Weight bearing as tolerated LLE Partial Weight Bearing Percentage or Pounds: Postop Ortho Shoe Pain: Pain Assessment Pain Scale: 0-10 Pain Score: 0-No pain   Therapy/Group: Individual Therapy  [name] 02/21/2020, 10:40 AM

## 2020-02-22 ENCOUNTER — Inpatient Hospital Stay (HOSPITAL_COMMUNITY): Payer: Medicare Other | Admitting: Occupational Therapy

## 2020-02-22 ENCOUNTER — Inpatient Hospital Stay (HOSPITAL_COMMUNITY): Payer: Medicare Other

## 2020-02-22 ENCOUNTER — Inpatient Hospital Stay (HOSPITAL_COMMUNITY): Payer: Medicare Other | Admitting: Physical Therapy

## 2020-02-22 LAB — GLUCOSE, CAPILLARY
Glucose-Capillary: 124 mg/dL — ABNORMAL HIGH (ref 70–99)
Glucose-Capillary: 191 mg/dL — ABNORMAL HIGH (ref 70–99)
Glucose-Capillary: 203 mg/dL — ABNORMAL HIGH (ref 70–99)
Glucose-Capillary: 105 mg/dL — ABNORMAL HIGH (ref 70–99)

## 2020-02-22 MED ORDER — FUROSEMIDE 20 MG PO TABS
10.0000 mg | ORAL_TABLET | Freq: Every day | ORAL | Status: DC
  Administered 2020-02-22 – 2020-02-28 (×7): 10 mg via ORAL
  Filled 2020-02-22 (×7): qty 1

## 2020-02-22 NOTE — Progress Notes (Signed)
Occupational Therapy Session Note  Patient Details  Name: Jason Montes MRN: [id number] Date of Birth: 02/27/1951  Today's Date: 02/22/2020 OT Individual [phone] and [id number] and 5732-2025 OT Individual Time Calculation (min): 60 min and 24 min and 54 min   Short Term Goals: Week 3:  OT Short Term Goal 1 (Week 3): STG = LTGs due to remaining LOS  Skilled Therapeutic Interventions/Progress Updates:    1) Treatment session with focus on self-care retraining, functional transfers, dynamic standing balance, and safety awareness during self-care tasks.  Pt received supine in bed reporting sleeping well overnight and expressing desire to shower this morning.  Pt completed bed mobility with supervision, required increased time for sit > stand ultimately able to complete from elevated EOB with CGA.  Pt reports pain in Lt heel, RN aware and provided tylenol.  Stand pivot transfers bed > w/c > tub bench in room shower with CGA.  Pt completed bathing at shower level with supervision and min cues for problem solving and safety awareness with sit > stand to wash buttocks.  Engaged in dressing at sit > stand at sink with pt able to thread BLE in to pants and required CGA when standing to pull pants over hips.  Pt continues to require assistance with donning Darco shoe and required assistance to don sock on Rt foot this session.  Pt completed grooming tasks in sitting at sink without assistance.  Pt remained upright in w/c with seat belt alarm on and all needs in reach.  MD arriving to assess pt.  Discussed possibility of building up offloading shoe more to pad heel.  2) Treatment session with focus on problem solving application of padding to Darco shoe for decrease friction/pressure when up and walking.  Therapist applied heel protector to Lt foot, unable to slide over due to bulk of kerlex wrapping therefore cut and placed on heel and securing it around ankle.  Therapist donned Darco shoe over heel  protector.  Pt engaged in sit <> stand and short ~5 feet ambulation to assess fit and security of shoe on foot with ambulation.  Pt reports heel feeling better and more supported, and no slippage.  Pt returned to upright in w/c with seat belt alarm on and all needs in reach.  Notified PT of heel protector and encouraged continued assessment of fit.  3) Treatment session with focus on sit <> stand and functional transfers.  Pt received upright in w/c reporting fatigue but willing to attempt therapy session.  Pt completed sit > stand with CGA and increased time to come to full upright standing posture.  Ambulated short distance to therapy mat with RW and CGA.  Engaged in sit <> stand from mat with focus on weight shifting and hand placement during sit > stand.  Engaged in bean bag toss with focus on dynamic standing balance with reaching and tossing.  Pt with one LOB backwards requiring min-mod assist to correct and ultimately easing pt back to sitting position on edge of mat.  Pt reports feeling balance is off this date and requested to not work on standing any more this session.  Pt requesting to return to room.  Pt also reports feeling cold, therefore covered pt with blankets and turned up heat in room.  Discussed upcoming d/c and goal level.  Pt reports caregiver will return to assist him, as she was before, and arrangements are being made with his daughter in regards to d/c home.  Pt remained upright in w/c  with seat belt alarm on and all needs in reach.  Therapy Documentation Precautions:  Precautions Precautions: Fall, Other (comment) Precaution Comments: LLE WBAT heel only with post-op shoe Required Braces or Orthoses: Other Brace Other Brace: postop shoe Restrictions Weight Bearing Restrictions: Yes RLE Weight Bearing: Weight bearing as tolerated LLE Weight Bearing: Weight bearing as tolerated LLE Partial Weight Bearing Percentage or Pounds: Postop Ortho Shoe General:   Vital Signs: Therapy  Vitals Temp: 97.9 F (36.6 C) Pulse Rate: 85 Resp: 18 BP: 116/65 Patient Position (if appropriate): Lying Oxygen Therapy SpO2: 94 % O2 Device: Room Air Pain: Pain Assessment Pain Scale: 0-10 Pain Score: 5  Pain Type: Acute pain Pain Location: Heel Pain Orientation: Left Pain Descriptors / Indicators: Aching Pain Frequency: Intermittent Pain Onset: On-going Patients Stated Pain Goal: 0 Pain Intervention(s): Medication (See eMAR)    Therapy/Group: Individual Therapy  [name] 02/22/2020, 8:18 AM

## 2020-02-22 NOTE — Progress Notes (Signed)
Physical Therapy Session Note  Patient Details  Name: Jason Montes MRN: [id number] Date of Birth: 11/12/1950  Today's Date: 02/22/2020 PT Individual Time: 1828-8337 PT Individual Time Calculation (min): 30 min   Short Term Goals: Week 4:  PT Short Term Goal 1 (Week 4): STG=LTG due to LOS  Skilled Therapeutic Interventions/Progress Updates: Pt presented in w/c sleeping but easily aroused and agreeable to therapy. Pt denies pain during session but does indicate some fatigue. Pt agreeable to participate in seated LE therex as noted below for BLE strengthening. Pt demonstrated good tolerance to all activities and rest breaks were provided as needed, RLE 2.5lb/LLE 3lb: LAQ, hip flexion, hip abd/add 2 x 10 bilaterally. Adductor squeeze 2 x 10, hamstring pulls level 2 resistance band 2 x 10. Once completed pt requesting to remain in w/c with call bell within reach and needs met.      Therapy Documentation Precautions:  Precautions Precautions: Fall, Other (comment) Precaution Comments: LLE WBAT heel only with post-op shoe Required Braces or Orthoses: Other Brace Other Brace: Darco shoe Restrictions Weight Bearing Restrictions: Yes RLE Weight Bearing: Weight bearing as tolerated LLE Weight Bearing: Weight bearing as tolerated LLE Partial Weight Bearing Percentage or Pounds: Darco shoe General:   Vital Signs: Therapy Vitals Pulse Rate: 72 Resp: 18 BP: (!) 114/57 Patient Position (if appropriate): Sitting Oxygen Therapy SpO2: 100 % O2 Device: Room Air Pain: Pain Assessment Pain Scale: 0-10 Pain Score: 4  Pain Type: Acute pain Pain Location: Heel Pain Orientation: Left Pain Descriptors / Indicators: Aching Pain Onset: On-going Pain Intervention(s): RN made aware;Repositioned Mobility:   Locomotion :    Trunk/Postural Assessment :    Balance:   Exercises:   Other Treatments:      Therapy/Group: Individual Therapy  [name] 02/22/2020, 4:15 PM

## 2020-02-22 NOTE — Patient Care Conference (Signed)
Inpatient RehabilitationTeam Conference and Plan of Care Update Date: 02/22/2020   Time: 11:36 AM    Patient Name: Jason Montes      Medical Record Number: [id number]  Date of Birth: 22-Jan-1951 Sex: Male         Room/Bed: 4M04C/4M04C-01 Payor Info: Payor: MEDICARE / Plan: MEDICARE PART A AND B / Product Type: *No Product type* /    Admit Date/Time:  02/03/2020  1:10 PM  Primary Diagnosis:  Acute metabolic encephalopathy  Hospital Problems: Principal Problem:   Acute metabolic encephalopathy Active Problems:   Post-operative pain   Benign essential HTN   Diabetic peripheral neuropathy (HCC)   Labile blood glucose   Chronic systolic congestive heart failure (HCC)   Urinary retention   Thrombocytopenia ([facility])   Hyponatremia   Acute blood loss anemia   Acute lower UTI   Suicidal ideation   Decubitus ulcer    Expected Discharge Date: Expected Discharge Date: 02/26/20  Team Members Present: Physician leading conference: Dr. Delice Lesch Care Coodinator Present: Dorien Chihuahua, RN, BSN, CRRN;Other (comment) Jacqlyn Larsen [place], SW) Nurse Present: Suella Grove, RN PT Present: Becky Sax, PT OT Present: [name], OT SLP Present: Charolett Bumpers, SLP PPS Coordinator present : Ileana Ladd, Burna Mortimer, SLP     Current Status/Progress Goal Weekly Team Focus  Bowel/Bladder   Patient is continent of bowel and bladder. Patient urinate with residual of less than 100 mL. LBM 02/18/20 patient has schedule laxative and stool softener.  Maintain continent of bowel and bladder. Obtain regular schedule of BM and able to completely empty bladder  Assess q shift and prn. Give prn gi medication continue to pvr q 6 and scan for volumes over 350   Swallow/Nutrition/ Hydration             ADL's   CGA sit > stand and ambulatory transfers with RW, CGA bathing at shower level, Min assist LB dressing including footwear  Supervision  ADL retraining, still working on footwear (especially  Darco shoe), sit > stand, dynamic standing balance, recall of previous techniques   Mobility   bed mobility supervision, transfers CGA with RW, gait 177ft with RW CGA/min A, 4 steps 1 rail min A, WC mobility 40ft supervision.  supervision, CGA for stairs  functional mobility/transfers, activity tolerance, dynamic standing balance/coordination, ambulation, stair navigation, education, and endurance.   Communication             Safety/Cognition/ Behavioral Observations            Pain   Patient denies pain  pain level <3 with or without activity  q shift and PRN pain assessment   Skin   MASD, stage II to buttocks, L foot and unstageable wound in heel  keeping wound free from further breakdown and infection  q shift and PRN skin assessment     Discharge Planning:  [facility] with hired assist from friend and PCS via [place]. [facility] health arranged and has all equipment. Need to see who is transporting pt home Sunday   Team Discussion: WB issues, Wounds on foot and coccyx. Urinary retention has resolved; discontinued bladder scans per MD.  Patient on target to meet rehab goals: yes, supervision goals set  *See Care Plan and progress notes for long and short-term goals.   Revisions to Treatment Plan:  Working on lower body dressing issues; specifically sock and shoe don/doff. Discharged from SLP services Teaching Needs: Transfers, steps, lower body dressing and wound/skin care  Current Barriers to Discharge: Decreased  caregiver support, Home enviroment access/layout and Wound care  Possible Resolutions to Barriers: Education on lower body dressing PCS and hired help at discharge     Medical Summary Current Status: Altered mental status secondary to acute metabolic encephalopathy related to possible drug overdose as well as history of multiple CVAs/vascular dementia  Barriers to Discharge: Behavior;Medical stability;Home enviroment access/layout;Wound care   Possible Resolutions to  Barriers/Weekly Focus: Therapies, follow labs, optimize DM meds, optimize pain meds, abx for UTI, wound management, educate on offloading   Continued Need for Acute Rehabilitation Level of Care: The patient requires daily medical management by a physician with specialized training in physical medicine and rehabilitation for the following reasons: Direction of a multidisciplinary physical rehabilitation program to maximize functional independence : Yes Medical management of patient stability for increased activity during participation in an intensive rehabilitation regime.: Yes Analysis of laboratory values and/or radiology reports with any subsequent need for medication adjustment and/or medical intervention. : Yes   I attest that I was present, lead the team conference, and concur with the assessment and plan of the team.   Dorien Chihuahua B 02/22/2020, 12:34 PM

## 2020-02-22 NOTE — Progress Notes (Addendum)
[place] PHYSICAL MEDICINE & REHABILITATION PROGRESS NOTE   Subjective/Complaints: Patient seen sitting up in a chair working with therapy this morning.  He states he slept well overnight.  Discussed offloading and ulcers with patient and therapies.  ROS: Denies CP, SOB, N/V/D  Objective:   No results found. No results for input(s): WBC, HGB, HCT, PLT in the last 72 hours. No results for input(s): NA, K, CL, CO2, GLUCOSE, BUN, CREATININE, CALCIUM in the last 72 hours.  Intake/Output Summary (Last 24 hours) at 02/22/2020 0924 Last data filed at 02/22/2020 0700 Gross per 24 hour  Intake 540 ml  Output 975 ml  Net -435 ml     Physical Exam: Vital Signs Blood pressure 116/65, pulse 85, temperature 97.9 F (36.6 C), resp. rate 18, weight 80 kg, SpO2 94 %.  Constitutional: No distress . Vital signs reviewed. HENT: Normocephalic.  Atraumatic. Eyes: EOMI. No discharge. Cardiovascular: No JVD.  RRR. Respiratory: Normal effort.  No stridor.  Bilateral clear to auscultation. GI: Non-distended.  BS +. Skin: Left foot with serosanguineous drainage at amputation site Left heel ulcer, unstageable Psych: Normal mood.  Normal behavior. Musc: Left hallux amputation with edema and tenderness Left heel ulcer with edema and tenderness Neuro: Alert Motor: 4+/5 throughout B/l UE resting tremors, unchanged  Assessment/Plan: 1. Functional deficits secondary to acute metabolic encephalopathy/drug overdose and hx of multiple CVAs which require 3+ hours per day of interdisciplinary therapy in a comprehensive inpatient rehab setting.  Physiatrist is providing close team supervision and 24 hour management of active medical problems listed below.  Physiatrist and rehab team continue to assess barriers to discharge/monitor patient progress toward functional and medical goals  Care Tool:  Bathing    Body parts bathed by patient: Right arm, Left arm, Chest, Abdomen, Right upper leg, Left upper  leg, Face, Front perineal area, Buttocks, Right lower leg   Body parts bathed by helper: Right lower leg, Buttocks Body parts n/a: Left lower leg   Bathing assist Assist Level: Supervision/Verbal cueing     Upper Body Dressing/Undressing Upper body dressing   What is the patient wearing?: Pull over shirt    Upper body assist Assist Level: Set up assist    Lower Body Dressing/Undressing Lower body dressing      What is the patient wearing?: Underwear/pull up, Pants     Lower body assist Assist for lower body dressing: Contact Guard/Touching assist     Toileting Toileting    Toileting assist Assist for toileting: Contact Guard/Touching assist     Transfers Chair/bed transfer  Transfers assist     Chair/bed transfer assist level: Contact Guard/Touching assist     Locomotion Ambulation   Ambulation assist   Ambulation activity did not occur: Safety/medical concerns  Assist level: Contact Guard/Touching assist Assistive device: Walker-rolling Max distance: 156ft   Walk 10 feet activity   Assist  Walk 10 feet activity did not occur: Safety/medical concerns  Assist level: Contact Guard/Touching assist Assistive device: Walker-rolling   Walk 50 feet activity   Assist Walk 50 feet with 2 turns activity did not occur: Safety/medical concerns  Assist level: Contact Guard/Touching assist Assistive device: Walker-rolling    Walk 150 feet activity   Assist Walk 150 feet activity did not occur: Safety/medical concerns  Assist level: Contact Guard/Touching assist Assistive device: Walker-rolling    Walk 10 feet on uneven surface  activity   Assist Walk 10 feet on uneven surfaces activity did not occur: Safety/medical concerns  Wheelchair     Assist Will patient use wheelchair at discharge?: No Type of Wheelchair: Manual    Wheelchair assist level: Supervision/Verbal cueing Max wheelchair distance: 67ft    Wheelchair 50 feet  with 2 turns activity    Assist        Assist Level: Supervision/Verbal cueing   Wheelchair 150 feet activity     Assist      Assist Level: Maximal Assistance - Patient 25 - 49%   Blood pressure 116/65, pulse 85, temperature 97.9 F (36.6 C), resp. rate 18, weight 80 kg, SpO2 94 %.  Medical Problem List and Plan: 1.  Altered mental status secondary to acute metabolic encephalopathy related to possible drug overdose as well as history of multiple CVAs/vascular dementia  Continue CIR  Team conference today to discuss current and goals and coordination of care, home and environmental barriers, and discharge planning with nursing, case manager, and therapies. Please see conference note from today as well.  2.  Antithrombotics: -DVT/anticoagulation: Eliquis             -antiplatelet therapy: Aspirin 81 mg daily, Plavix 75 mg daily 3. Pain Management: Neurontin 200 mg 3 times daily, Cymbalta 20 mg daily.   Relatively controlled with meds on 9/22 4. Mood: Provide emotional support             -antipsychotic agents: Haldol 1 mg BID, Zyprexa 12.5 mg nightly  He was seen by psychiatry, notes reviewed-continue supportive care. 5. Neuropsych: This patient is not fully capable of making decisions on his own behalf. 6. Skin/Wound Care: Routine skin checks  DCed Prevalon boot, ordered PRAFO with benefit  Discussed offloading the sacral area  Discussed offloading and pressure relief to heal with offloading shoe 7. Fluids/Electrolytes/Nutrition: Routine in and outs with follow-up chemistries 8.  Atrial fibrillation.  Amiodarone 400 mg daily. Vitals:   02/21/20 1934 02/22/20 0448  BP: (!) 116/49 116/65  Pulse: 79 85  Resp: 16 18  Temp: 98.2 F (36.8 C) 97.9 F (36.6 C)  SpO2: 96% 94%   HR controlled on 9/22 9.  Chronic left hallux osteomyelitis.  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 Caroline More.  Weightbearing as tolerated through left heel only with postoperative  shoe  Needs cues to remember WB precautions  Will ask surgery to reevaluate wound as well as heel 10.  Hypertension.  Monitor with increased mobility.   Controlled on 9/22 11.  Diabetes mellitus with peripheral neuropathy.  Hemoglobin A1c 7.9.    NovoLog 6 units 3 times daily with meals, increased to 9 3 times daily on 9/9, increased to 11 3 times daily on 9/14  Diabetic teaching.   Increased Lantus to 14U on 9/7.   CBG (last 3)  Recent Labs    02/21/20 1637 02/21/20 2129 02/22/20 0611  GLUCAP 241* 239* 124*   Labile 9/22, monitor for trend 12.  CKD stage III as well as bouts of hyperkalemia.  Admission creatinine 1.59.  Continue Lokelma 5 mg twice daily.  Creatinine 1.31 on 9/17  Encourage fluids 13.  Hyperlipidemia.  Lipitor 14.  CAD with history of pacemaker.  Continue aspirin 15.  Chronic systolic congestive heart failure.  Latest ejection fraction 40 to 45%.  Monitor for any signs of fluid overload   Filed Weights   02/19/20 0349 02/21/20 0500 02/22/20 0500  Weight: 77.9 kg 78.2 kg 80 kg   Trending up on 9/22, Lasix 10 daily started on 9/22  Monitor BMP closely 16.  Urinary retention.    Increased Flomax to 0.8 mg daily.  17. Orthostatic hypotension: abdominal binder and TEDs ordered  Improving 18.  Thrombocytopenia  Platelets 64 on 9/10 repeat with clumps again, discussed with lab, no improvement with citrate  Discussed with pharmacy - no obvious offending agents 19.  Chronic hyponatremia  Sodium 131 on 9/17  Monitor closely with addition of diuretic 20. UTI  resolved  Completed course of Keflex 21. Acute blood loss anemia  Hemoglobin 8.3 on 9/17, labs ordered for tomorrow 22. Constipation: prune juice with meals.    LOS: 19 days A FACE TO FACE EVALUATION WAS PERFORMED  Breyson Kelm Lorie Phenix 02/22/2020, 9:24 AM

## 2020-02-22 NOTE — Progress Notes (Signed)
Physical Therapy Session Note  Patient Details  Name: Jason Montes MRN: [id number] Date of Birth: [birth year]  Today's Date: 02/22/2020 PT Individual Time: 1030-1124 PT Individual Time Calculation (min): 54 min   Short Term Goals: Week 3:  PT Short Term Goal 1 (Week 3): pt will perform all bed mobility with CGA consistantly PT Short Term Goal 1 - Progress (Week 3): Met PT Short Term Goal 2 (Week 3): pt will transfer sit<>stand with LRAD and CGA consistantly PT Short Term Goal 2 - Progress (Week 3): Met PT Short Term Goal 3 (Week 3): Pt will ambulate 130f with LRAD CGA PT Short Term Goal 3 - Progress (Week 3): Met Week 4:  PT Short Term Goal 1 (Week 4): STG=LTG due to LOS  Skilled Therapeutic Interventions/Progress Updates:   Received pt sitting in WC, pt agreeable to therapy, and denied any pain during session. Session with emphasis on functional mobility/transfers, generalized strengthening, dynamic standing balance/coordination, stair navigation, and improved activity tolerance. [facility] and navigated 4 steps x 2 trials with L handrail CGA/min A  using a lateral stepping technique ascending and descending with a step to pattern. Pt demonstrated improvements with overall technique and required less cues for even foot placement on step. [facility] and transferred stand<>pivot WC<>Nustep with RW and CGA. Pt performed bilateral UE/LE strengthening on workload 4/5 or 10 minutes for a total of 374 steps for improved cardiovascular endurance. Worked on dynamic standing balance playing cornhole. Therapist suggested attempting without RW to challenge balance and pt transferred sit<>stand without AD and min A but unable to come upright. Pt requested to use RW instead. Sit<>stand with RW CGA and pt threw 1 beanbag with L UE and demonstrated posterior LOB into WC. Pt stated "that's it, I quit, I just fell". Pt  with increased fear of falling after LOB and therapist educated pt on purpose of challenging balance in therapy but pt continued to state "I don't want to do this anymore". Pt reported fatigue and soreness from therapy yesterday and requested to return to room. [facility] Concluded session with pt sitting in WC, needs within reach, and seatbelt alarm on. Memory book updated. Therapist provided fresh drinks for pt.   Therapy Documentation Precautions:  Precautions Precautions: Fall, Other (comment) Precaution Comments: LLE WBAT heel only with post-op shoe Required Braces or Orthoses: Other Brace Other Brace: postop shoe Restrictions Weight Bearing Restrictions: Yes RLE Weight Bearing: Weight bearing as tolerated LLE Weight Bearing: Weight bearing as tolerated LLE Partial Weight Bearing Percentage or Pounds: Postop Ortho Shoe  Therapy/Group: Individual Therapy AAlfonse AlpersPT, DPT   02/22/2020, 7:24 AM

## 2020-02-22 NOTE — Progress Notes (Signed)
Occupational Therapy Discharge Summary  Patient Details  Name: Jason Montes MRN: [id number] Date of Birth: November 02, 1950   Patient has met 9 of 11 long term goals due to improved activity tolerance, improved balance, postural control, ability to compensate for deficits and improved awareness.  Patient to discharge at overall Supervision level, requiring min assist for LB dressing due to requiring assistance with Darco shoe and min assist to [place] for toileting.  Patient's care partner is independent to provide the necessary supervision assistance at discharge.  Patient reports plan to return to prior residence and has personal caregiver as well as aide from [place] who will return to provide assistance as needed.  Reasons goals not met: Pt continues to require min assist for LB dressing due to requiring assistance with Darco shoe and min assist to CGA for toileting  Recommendation:  Patient will benefit from ongoing skilled OT services in home health setting to continue to advance functional skills in the area of BADL and Reduce care partner burden.  Equipment: No equipment provided  Reasons for discharge: treatment goals met and discharge from hospital  Patient/family agrees with progress made and goals achieved: Yes  OT Discharge Precautions/Restrictions  Precautions Precautions: Fall;Other (comment) Precaution Comments: LLE WBAT heel only with post-op shoe Required Braces or Orthoses: Other Brace Other Brace: Darco shoe Restrictions Weight Bearing Restrictions: Yes RLE Weight Bearing: Weight bearing as tolerated LLE Weight Bearing: Weight bearing as tolerated LLE Partial Weight Bearing Percentage or Pounds: Darco shoe General   Vital Signs Therapy Vitals Pulse Rate: 72 Resp: 18 BP: (!) 114/57 Patient Position (if appropriate): Sitting Oxygen Therapy SpO2: 100 % O2 Device: Room Air Pain Pain Assessment Pain Scale: 0-10 Pain Score: 4  Pain Type: Acute pain Pain Location:  Heel Pain Orientation: Left Pain Descriptors / Indicators: Aching Pain Onset: On-going Pain Intervention(s): RN made aware;Repositioned ADL ADL Eating: Independent Where Assessed-Eating: Chair Grooming: Modified independent Where Assessed-Grooming: Sitting at sink Upper Body Bathing: Supervision/safety Where Assessed-Upper Body Bathing: Shower Lower Body Bathing: Supervision/safety Where Assessed-Lower Body Bathing: Shower Upper Body Dressing: Setup Where Assessed-Upper Body Dressing: Wheelchair Lower Body Dressing: Minimal assistance Where Assessed-Lower Body Dressing: Sitting at sink, Standing at sink Toileting: Supervision/safety Where Assessed-Toileting: Glass blower/designer: Close supervision Toilet Transfer Method: Counselling psychologist: Geophysical data processor: Close supervision Social research officer, government Method: Heritage manager: Radio broadcast assistant, Grab bars Vision Baseline Vision/History: Wears glasses (Rt visual field cut from prior CVA) Wears Glasses: Reading only Vision Assessment?: Yes Ocular Range of Motion: Restricted on the right Tracking/Visual Pursuits: Decreased smoothness of horizontal tracking;Decreased smoothness of eye movement to RIGHT superior field;Decreased smoothness of eye movement to RIGHT inferior field;Impaired - to be further tested in functional context Cognition Overall Cognitive Status: History of cognitive impairments - at baseline Arousal/Alertness: Awake/alert Focused Attention: Appears intact Selective Attention: Appears intact Memory: Impaired Memory Impairment: Storage deficit;Decreased recall of new information;Decreased short term memory Awareness: Impaired Awareness Impairment: Emergent impairment Problem Solving: Impaired Problem Solving Impairment: Functional basic Safety/Judgment: Impaired Sensation Sensation Light Touch: Impaired Detail Peripheral sensation comments:  decreased on R lateral lower leg and distally; decreased on L lower leg medially and distally Light Touch Impaired Details: Impaired RLE;Impaired LLE Hot/Cold: Not tested Proprioception: Impaired by gross assessment Stereognosis: Not tested Coordination Gross Motor Movements are Fluid and Coordinated: No Fine Motor Movements are Fluid and Coordinated: Yes Coordination and Movement Description: impaired due to L LE WBing restrictions, generalized weakness, and impaired balance Finger Nose Finger  Test: impaired on Rt due to prior CVA with residual deficits Motor  Motor Motor - Skilled Clinical Observations: decreased trunk flexibility, generalized weakness Extremity/Trunk Assessment RUE Assessment RUE Assessment: Exceptions to [facility] Active Range of Motion (AROM) Comments: h/o prior CVA with residual deficits; decreased at shoulder, approx 75-90* General Strength Comments: 4/5 within his ROM LUE Assessment LUE Assessment: Within Functional Limits General Strength Comments: generalized weakness but functional   Dayna Alia, [facility] 02/22/2020, 3:57 PM

## 2020-02-23 ENCOUNTER — Inpatient Hospital Stay (HOSPITAL_COMMUNITY): Payer: Medicare Other

## 2020-02-23 LAB — CBC WITH DIFFERENTIAL/PLATELET
Abs Immature Granulocytes: 0.05 10*3/uL (ref 0.00–0.07)
Basophils Absolute: 0.1 10*3/uL (ref 0.0–0.1)
Basophils Relative: 1 %
Eosinophils Absolute: 0.3 10*3/uL (ref 0.0–0.5)
Eosinophils Relative: 3 %
HCT: 27.2 % — ABNORMAL LOW (ref 39.0–52.0)
Hemoglobin: 8 g/dL — ABNORMAL LOW (ref 13.0–17.0)
Immature Granulocytes: 1 %
Lymphocytes Relative: 8 %
Lymphs Abs: 0.7 10*3/uL (ref 0.7–4.0)
MCH: 26.5 pg (ref 26.0–34.0)
MCHC: 29.4 g/dL — ABNORMAL LOW (ref 30.0–36.0)
MCV: 90.1 fL (ref 80.0–100.0)
Monocytes Absolute: 0.5 10*3/uL (ref 0.1–1.0)
Monocytes Relative: 6 %
Neutro Abs: 7.1 10*3/uL (ref 1.7–7.7)
Neutrophils Relative %: 81 %
Platelets: ADEQUATE 10*3/uL (ref 150–400)
RBC: 3.02 MIL/uL — ABNORMAL LOW (ref 4.22–5.81)
RDW: 17.4 % — ABNORMAL HIGH (ref 11.5–15.5)
WBC: 8.7 10*3/uL (ref 4.0–10.5)
nRBC: 0 % (ref 0.0–0.2)

## 2020-02-23 LAB — GLUCOSE, CAPILLARY
Glucose-Capillary: 106 mg/dL — ABNORMAL HIGH (ref 70–99)
Glucose-Capillary: 253 mg/dL — ABNORMAL HIGH (ref 70–99)
Glucose-Capillary: 294 mg/dL — ABNORMAL HIGH (ref 70–99)
Glucose-Capillary: 132 mg/dL — ABNORMAL HIGH (ref 70–99)

## 2020-02-23 MED ORDER — INSULIN GLARGINE 100 UNIT/ML SOLOSTAR PEN: 14.0000 [IU] | PEN_INJECTOR | Freq: Every day | SUBCUTANEOUS | 11 refills | Status: AC

## 2020-02-23 MED ORDER — FAMOTIDINE 20 MG PO TABS: 20.0000 mg | ORAL_TABLET | Freq: Every day | ORAL | 0 refills | Status: AC

## 2020-02-23 MED ORDER — HALOPERIDOL 1 MG PO TABS: 1.0000 mg | ORAL_TABLET | Freq: Two times a day (BID) | ORAL | 0 refills | Status: AC

## 2020-02-23 MED ORDER — ACETAMINOPHEN 325 MG PO TABS: 650.0000 mg | ORAL_TABLET | Freq: Four times a day (QID) | ORAL | Status: AC | PRN

## 2020-02-23 MED ORDER — CYANOCOBALAMIN 1000 MCG PO TABS: 1000.0000 ug | ORAL_TABLET | Freq: Every day | ORAL | 0 refills | Status: AC

## 2020-02-23 MED ORDER — TAMSULOSIN HCL 0.4 MG PO CAPS: 0.8000 mg | ORAL_CAPSULE | Freq: Every day | ORAL | 0 refills | Status: AC

## 2020-02-23 MED ORDER — GABAPENTIN 100 MG PO CAPS: 200.0000 mg | ORAL_CAPSULE | Freq: Three times a day (TID) | ORAL | 0 refills | Status: AC

## 2020-02-23 MED ORDER — DULOXETINE HCL 20 MG PO CPEP: 20.0000 mg | ORAL_CAPSULE | Freq: Every day | ORAL | 0 refills | Status: AC

## 2020-02-23 MED ORDER — AMIODARONE HCL 400 MG PO TABS: 200.0000 mg | ORAL_TABLET | Freq: Every day | ORAL | 0 refills | Status: AC

## 2020-02-23 MED ORDER — NITROGLYCERIN 0.4 MG SL SUBL: 0.4000 mg | SUBLINGUAL_TABLET | SUBLINGUAL | 12 refills | Status: AC | PRN

## 2020-02-23 MED ORDER — ASCORBIC ACID 500 MG PO TABS: 500.0000 mg | ORAL_TABLET | Freq: Two times a day (BID) | ORAL | 0 refills | Status: AC

## 2020-02-23 MED ORDER — FUROSEMIDE 20 MG PO TABS: 10.0000 mg | ORAL_TABLET | Freq: Every day | ORAL | 0 refills | Status: AC

## 2020-02-23 MED ORDER — INSULIN ASPART 100 UNIT/ML FLEXPEN: 11.0000 [IU] | PEN_INJECTOR | Freq: Three times a day (TID) | SUBCUTANEOUS | 11 refills | Status: AC

## 2020-02-23 MED ORDER — ADULT MULTIVITAMIN W/MINERALS CH: 1.0000 | ORAL_TABLET | Freq: Every day | ORAL | Status: AC

## 2020-02-23 MED ORDER — ZINC SULFATE 220 (50 ZN) MG PO CAPS: 220.0000 mg | ORAL_CAPSULE | Freq: Every day | ORAL | 0 refills | Status: AC

## 2020-02-23 MED ORDER — OLANZAPINE 15 MG PO TBDP: 12.5000 mg | ORAL_TABLET | Freq: Every day | ORAL | 0 refills | Status: AC

## 2020-02-23 MED ORDER — ATORVASTATIN CALCIUM 40 MG PO TABS: 40.0000 mg | ORAL_TABLET | Freq: Every day | ORAL | 0 refills | Status: AC

## 2020-02-23 MED ORDER — APIXABAN 5 MG PO TABS: 5.0000 mg | ORAL_TABLET | Freq: Two times a day (BID) | ORAL | 0 refills | Status: AC

## 2020-02-23 MED ORDER — VITAMIN D3 25 MCG (1000 UT) PO CAPS: 1000.0000 [IU] | ORAL_CAPSULE | Freq: Every day | ORAL | 0 refills | Status: AC

## 2020-02-23 NOTE — Progress Notes (Signed)
[place] PHYSICAL MEDICINE & REHABILITATION PROGRESS NOTE   Subjective/Complaints: Has no complaints. Pain is well controlled. Moving bowels regularly.  Feels he is improving well with therapy.   ROS: Denies CP, SOB, N/V/D  Objective:   No results found. No results for input(s): WBC, HGB, HCT, PLT in the last 72 hours. No results for input(s): NA, K, CL, CO2, GLUCOSE, BUN, CREATININE, CALCIUM in the last 72 hours.  Intake/Output Summary (Last 24 hours) at 02/23/2020 1222 Last data filed at 02/23/2020 0746 Gross per 24 hour  Intake 720 ml  Output --  Net 720 ml     Physical Exam: Vital Signs Blood pressure 119/65, pulse 80, temperature 98 F (36.7 C), resp. rate 18, weight 81.7 kg, SpO2 92 %.  Gen: no distress, normal appearing HEENT: oral mucosa pink and moist, NCAT Cardio: Reg rate Chest: normal effort, normal rate of breathing Abd: soft, non-distended Ext: no edema Skin: Left foot with serosanguineous drainage at amputation site Left heel ulcer, unstageable Psych: Normal mood.  Normal behavior. Musc: Left hallux amputation with edema and tenderness Left heel ulcer with edema and tenderness Neuro: Alert Motor: 4+/5 throughout B/l UE resting tremors, unchanged   Assessment/Plan: 1. Functional deficits secondary to acute metabolic encephalopathy/drug overdose and hx of multiple CVAs which require 3+ hours per day of interdisciplinary therapy in a comprehensive inpatient rehab setting.  Physiatrist is providing close team supervision and 24 hour management of active medical problems listed below.  Physiatrist and rehab team continue to assess barriers to discharge/monitor patient progress toward functional and medical goals  Care Tool:  Bathing    Body parts bathed by patient: Right arm, Left arm, Chest, Abdomen, Right upper leg, Left upper leg, Face, Front perineal area, Buttocks, Right lower leg   Body parts bathed by helper: Right lower leg, Buttocks Body  parts n/a: Left lower leg   Bathing assist Assist Level: Supervision/Verbal cueing     Upper Body Dressing/Undressing Upper body dressing   What is the patient wearing?: Pull over shirt    Upper body assist Assist Level: Set up assist    Lower Body Dressing/Undressing Lower body dressing      What is the patient wearing?: Underwear/pull up, Pants     Lower body assist Assist for lower body dressing: Contact Guard/Touching assist     Toileting Toileting    Toileting assist Assist for toileting: Contact Guard/Touching assist     Transfers Chair/bed transfer  Transfers assist     Chair/bed transfer assist level: Contact Guard/Touching assist     Locomotion Ambulation   Ambulation assist   Ambulation activity did not occur: Safety/medical concerns  Assist level: Contact Guard/Touching assist Assistive device: Walker-rolling Max distance: 146ft   Walk 10 feet activity   Assist  Walk 10 feet activity did not occur: Safety/medical concerns  Assist level: Contact Guard/Touching assist Assistive device: Walker-rolling   Walk 50 feet activity   Assist Walk 50 feet with 2 turns activity did not occur: Safety/medical concerns  Assist level: Contact Guard/Touching assist Assistive device: Walker-rolling    Walk 150 feet activity   Assist Walk 150 feet activity did not occur: Safety/medical concerns  Assist level: Contact Guard/Touching assist Assistive device: Walker-rolling    Walk 10 feet on uneven surface  activity   Assist Walk 10 feet on uneven surfaces activity did not occur: Safety/medical concerns         Wheelchair     Assist Will patient use wheelchair at discharge?: No Type  of Wheelchair: Manual    Wheelchair assist level: Supervision/Verbal cueing Max wheelchair distance: 61ft    Wheelchair 50 feet with 2 turns activity    Assist        Assist Level: Supervision/Verbal cueing   Wheelchair 150 feet activity      Assist      Assist Level: Maximal Assistance - Patient 25 - 49%   Blood pressure 119/65, pulse 80, temperature 98 F (36.7 C), resp. rate 18, weight 81.7 kg, SpO2 92 %.  Medical Problem List and Plan: 1.  Altered mental status secondary to acute metabolic encephalopathy related to possible drug overdose as well as history of multiple CVAs/vascular dementia  Continue CIR 2.  Antithrombotics: -DVT/anticoagulation: Eliquis             -antiplatelet therapy: Aspirin 81 mg daily, Plavix 75 mg daily 3. Pain Management: Neurontin 200 mg 3 times daily, Cymbalta 20 mg daily.   Well controlled 9/23.  4. Mood: Provide emotional support             -antipsychotic agents: Haldol 1 mg BID, Zyprexa 12.5 mg nightly  He was seen by psychiatry, notes reviewed-continue supportive care. 5. Neuropsych: This patient is not fully capable of making decisions on his own behalf. 6. Skin/Wound Care: Routine skin checks  DCed Prevalon boot, ordered PRAFO with benefit  Discussed offloading the sacral area  Discussed offloading and pressure relief to heal with offloading shoe 7. Fluids/Electrolytes/Nutrition: Routine in and outs with follow-up chemistries 8.  Atrial fibrillation.  Amiodarone 400 mg daily. Vitals:   02/22/20 1939 02/23/20 0417  BP: 113/65 119/65  Pulse: 76 80  Resp: 18 18  Temp: 97.6 F (36.4 C) 98 F (36.7 C)  SpO2: 98% 92%   HR controlled 9/23 9.  Chronic left hallux osteomyelitis.  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 Caroline More.  Weightbearing as tolerated through left heel only with postoperative shoe  Needs cues to remember WB precautions  Will ask surgery to reevaluate wound as well as heel 10.  Hypertension.  Monitor with increased mobility.   Controlled 9/23 11.  Diabetes mellitus with peripheral neuropathy.  Hemoglobin A1c 7.9.    NovoLog 6 units 3 times daily with meals, increased to 9 3 times daily on 9/9, increased to 11 3 times daily on  9/14  Diabetic teaching.   Increased Lantus to 14U on 9/7.   CBG (last 3)  Recent Labs    02/22/20 2101 02/23/20 0621 02/23/20 1153  GLUCAP 105* 106* 253*   Labile 9/22, monitor for trend 12.  CKD stage III as well as bouts of hyperkalemia.  Admission creatinine 1.59.  Continue Lokelma 5 mg twice daily.  Creatinine 1.31 on 9/17  Encourage fluids 13.  Hyperlipidemia.  Lipitor 14.  CAD with history of pacemaker.  Continue aspirin 15.  Chronic systolic congestive heart failure.  Latest ejection fraction 40 to 45%.  Monitor for any signs of fluid overload   Filed Weights   02/21/20 0500 02/22/20 0500 02/23/20 0417  Weight: 78.2 kg 80 kg 81.7 kg   Trending up on 9/22, Lasix 10 daily started on 9/22  Monitor BMP closely 16.  Urinary retention.    Increased Flomax to 0.8 mg daily.  17. Orthostatic hypotension: abdominal binder and TEDs ordered  Improving 18.  Thrombocytopenia  Platelets 64 on 9/10 repeat with clumps again, discussed with lab, no improvement with citrate  Discussed with pharmacy - no obvious offending agents 19.  Chronic hyponatremia  Sodium 131 on 9/17  Monitor closely with addition of diuretic 20. UTI  resolved  Completed course of Keflex 21. Acute blood loss anemia  Hemoglobin 8.3 on 9/17, labs ordered for tomorrow 22. Constipation: prune juice with meals.    LOS: 20 days A FACE TO FACE EVALUATION WAS PERFORMED  Clide Deutscher Derrian Rodak 02/23/2020, 12:22 PM

## 2020-02-23 NOTE — Plan of Care (Signed)
  Problem: Consults Goal: RH GENERAL PATIENT EDUCATION Description: See Patient Education module for education specifics. Outcome: Progressing Goal: Nutrition Consult-if indicated Outcome: Progressing Goal: Diabetes Guidelines if Diabetic/Glucose > 140 Description: If diabetic or lab glucose is > 140 mg/dl - Initiate Diabetes/Hyperglycemia Guidelines & Document Interventions  Outcome: Progressing   Problem: RH BOWEL ELIMINATION Goal: RH STG MANAGE BOWEL WITH ASSISTANCE Description: STG Manage Bowel with  mod I Assistance. Outcome: Progressing Goal: RH STG MANAGE BOWEL W/MEDICATION W/ASSISTANCE Description: STG Manage Bowel with Medication with mod i Assistance. Outcome: Progressing   Problem: RH BLADDER ELIMINATION Goal: RH STG MANAGE BLADDER WITH ASSISTANCE Description: STG Manage Bladder With  mod I Assistance Outcome: Progressing Goal: RH STG MANAGE BLADDER WITH MEDICATION WITH ASSISTANCE Description: STG Manage Bladder With Medication With  mod I Assistance. Outcome: Progressing   Problem: RH SKIN INTEGRITY Goal: RH STG SKIN FREE OF INFECTION/BREAKDOWN Description: Patient will be able to manage skin with min assist Outcome: Progressing Goal: RH STG MAINTAIN SKIN INTEGRITY WITH ASSISTANCE Description: STG Maintain Skin Integrity With min Assistance. Outcome: Progressing Goal: RH STG ABLE TO PERFORM INCISION/WOUND CARE W/ASSISTANCE Description: STG Able To Perform Incision/Wound Care With  min Assistance. Outcome: Progressing   Problem: RH SAFETY Goal: RH STG ADHERE TO SAFETY PRECAUTIONS W/ASSISTANCE/DEVICE Description: STG Adhere to Safety Precautions With cues/reminders  Assistance/Device. Outcome: Progressing Goal: RH STG DECREASED RISK OF FALL WITH ASSISTANCE Description: STG Decreased Risk of Fall With cues/reminders Assistance. Outcome: Progressing   Problem: RH PAIN MANAGEMENT Goal: RH STG PAIN MANAGED AT OR BELOW PT'S PAIN GOAL Description: Pain less than or  equal to 2. Outcome: Progressing   Problem: RH KNOWLEDGE DEFICIT GENERAL Goal: RH STG INCREASE KNOWLEDGE OF SELF CARE AFTER HOSPITALIZATION Description: Patient will be able to manage care with cues/reminders using handouts and educational resources with cues/reminders Outcome: Progressing

## 2020-02-23 NOTE — Progress Notes (Signed)
Physical Therapy Discharge Summary  Patient Details  Name: Jason Montes MRN: [id number] Date of Birth: 1950/08/14  Patient has met 7 of 8 long term goals due to improved activity tolerance, improved balance, improved postural control, increased strength, improved attention, improved awareness and improved coordination. Pt's family did not attend family education training. However, pt plans to discharge back to boarding house and states he has a caregiver to assist at home, as she was before.  Reason goals not met: Pt did not meet car transfer goal of supervision and continues to require CGA for safety and due to balance deficits.   Recommendation:  Patient will benefit from ongoing skilled PT services in home health setting to continue to advance safe functional mobility, address ongoing impairments in transfers, generalized strengthening, dynamic standing balance/coordination, ambulation, stair navigation, endurance, and to minimize fall risk.  Equipment: No equipment provided  Reasons for discharge: treatment goals met  Patient/family agrees with progress made and goals achieved: Yes  PT Discharge Precautions/Restrictions Precautions Precautions: Fall;Other (comment) Precaution Comments: LLE WBAT heel only with Darco shoe Required Braces or Orthoses: Other Brace Other Brace: Darco shoe Restrictions Weight Bearing Restrictions: Yes RLE Weight Bearing: Weight bearing as tolerated LLE Weight Bearing: Weight bearing as tolerated LLE Partial Weight Bearing Percentage or Pounds: Darco shoe Cognition Overall Cognitive Status: History of cognitive impairments - at baseline Arousal/Alertness: Awake/alert Orientation Level: Oriented X4 Memory: Impaired Awareness: Impaired Problem Solving: Impaired Safety/Judgment: Impaired Comments: pt requires cues for safety awareness Sensation Sensation Light Touch: Impaired by gross assessment Proprioception: Impaired by gross  assessment Additional Comments: decreased sensation R LE> L LE due to previous CVA Coordination Gross Motor Movements are Fluid and Coordinated: No Fine Motor Movements are Fluid and Coordinated: Yes Coordination and Movement Description: grossly uncoordinated due to decreased balance/postural control, generalized weakness, WB restrictions, and poor endurance Finger Nose Finger Test: [facility] bilaterally Heel Shin Test: decreased ROM bilaterally Motor  Motor Motor: Abnormal postural alignment and control Motor - Skilled Clinical Observations: grossly uncoordinated due to generalized weakness, decreased balance/postural control, decreased trunk flexibility, and poor endurance  Mobility Bed Mobility Bed Mobility: Rolling Right;Rolling Left;Sit to Supine;Supine to Sit Rolling Right: Supervision/verbal cueing Rolling Left: Supervision/Verbal cueing Supine to Sit: Supervision/Verbal cueing Sit to Supine: Supervision/Verbal cueing Transfers Transfers: Sit to Stand;Stand to Sit;Stand Pivot Transfers Sit to Stand: Supervision/Verbal cueing (RW) Stand to Sit: Supervision/Verbal cueing (RW) Stand Pivot Transfers: Supervision/Verbal cueing (RW) Stand Pivot Transfer Details: Verbal cues for technique;Verbal cues for precautions/safety Stand Pivot Transfer Details (indicate cue type and reason): verbal cues to reach back prior to sitting Transfer (Assistive device): Rolling walker Locomotion  Gait Ambulation: Yes Gait Assistance: Supervision/Verbal cueing Gait Distance (Feet): 140 Feet Assistive device: Rolling walker Gait Assistance Details: Other (comment) Gait Assistance Details: none Gait Gait: Yes Gait Pattern: Impaired Gait Pattern: Step-to pattern;Decreased trunk rotation;Decreased stride length;Decreased step length - right;Decreased step length - left;Poor foot clearance - left;Poor foot clearance - right;Narrow base of support Gait velocity: decreased Stairs / Additional  Locomotion Stairs: Yes Stairs Assistance: Contact Guard/Touching assist Stair Management Technique: One rail Left Number of Stairs: 8 Height of Stairs: 6 Ramp: Contact Guard/touching assist (RW) Product manager Mobility: Yes Wheelchair Assistance: Chartered loss adjuster: Both upper extremities Wheelchair Parts Management: Needs assistance Distance: 59ft  Trunk/Postural Assessment  Cervical Assessment Cervical Assessment: Exceptions to [facility] (forward head) Thoracic Assessment Thoracic Assessment: Exceptions to [facility] (kyphosis, rigid trunk) Lumbar Assessment Lumbar Assessment: Exceptions to [facility] (posterior pelvic tilt) Postural Control Postural  Control: Deficits on evaluation  Balance Balance Balance Assessed: Yes Static Sitting Balance Static Sitting - Balance Support: Feet supported;Bilateral upper extremity supported Static Sitting - Level of Assistance: 6: Modified independent (Device/Increase time) Dynamic Sitting Balance Dynamic Sitting - Balance Support: Feet supported;Bilateral upper extremity supported Dynamic Sitting - Level of Assistance: 6: Modified independent (Device/Increase time) Static Standing Balance Static Standing - Balance Support: Bilateral upper extremity supported (RW) Static Standing - Level of Assistance: 5: Stand by assistance (close supervision) Dynamic Standing Balance Dynamic Standing - Balance Support: Bilateral upper extremity supported (RW) Dynamic Standing - Level of Assistance: 5: Stand by assistance (close supervision) Extremity Assessment  RLE Assessment RLE Assessment: Exceptions to [facility] General Strength Comments: grossly generalized to 4/5 LLE Assessment LLE Assessment: Exceptions to [facility] General Strength Comments: grossly generalized to 4/5  [name] PT, DPT  02/23/2020, 12:08 PM

## 2020-02-23 NOTE — Progress Notes (Signed)
Patient ID: Jason Montes, male   DOB: 1950/09/04, 69 y.o.   MRN: [id number]  Met with pt to discuss team conference progress toward his goal and discharge still planned for 9/26-Sunday. He reports his daughter-Jason Montes will be coming to transport him home. Have left a message for her. Will [facility] order to [facility] and [facility], along with Jason Montes who pt has hired to assist at the boarding house. Pt feels he has done well here and feels he is prepared to go home. He has all equipment needed for home.

## 2020-02-23 NOTE — Discharge Summary (Addendum)
Physician Discharge Summary  Patient ID: Jason Montes MRN: [id number] DOB/AGE: 1950-07-02 69 y.o.  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  Labs:  Basic Metabolic Panel: No results for input(s): NA, K, CL, CO2, GLUCOSE, BUN, CREATININE, CALCIUM, MG, PHOS in the last 168 hours.  CBC: Recent Labs  Lab 02/23/20 1249 02/23/20 1249 02/25/20 1425 02/26/20 0511 02/27/20 0719  WBC 8.7   < > 7.8 9.2 8.0  NEUTROABS 7.1  --   --   --   --   HGB 8.0*   < > 7.5* 7.3* 7.7*  HCT 27.2*   < > 24.3* 24.0* 25.7*  MCV 90.1   < > 88.7 88.2 88.9  PLT PLATELET CLUMPS NOTED ON SMEAR, COUNT APPEARS ADEQUATE  --   --  PLATELET CLUMPS NOTED ON SMEAR, COUNT APPEARS ADEQUATE PLATELET CLUMPS NOTED ON SMEAR, UNABLE TO ESTIMATE   < > = values in this interval not displayed.    CBG: Recent Labs  Lab 02/26/20 2107 02/27/20 0615 02/27/20 1120 02/27/20 1649 02/27/20 2056  GLUCAP 205* 110* 130* 233* 96   Family history.  Mother with congestive heart failure and colon cancer as well as diabetes.  Sister with thyroid disease.  Denies any esophageal cancer or rectal cancer  Brief HPI:   Jason Montes is a 69 y.o. right-handed male with history of hypertension hyperlipidemia, atrial fibrillation, CVA, diabetes mellitus, CKD stage III, CAD with cardiac arrest  pacemaker 2019 with resultant hypoxia, chronic left great toe osteomyelitis with peripheral vascular disease, systolic congestive heart failure.  Per chart review lives in a boarding home.  He is a daughter in the area.  Plans to return back to boarding home.  Presented 01/11/2020 with altered mental status.  There was some question of unintentional drug overdose.  Cranial CT scan negative for acute process.  Sequela of remote left occipital and left cerebellar insults.  Chest x-ray mild streaky bibasilar opacities favoring atelectasis.  EEG negative for seizure.  Urine drug screen positive for tricyclic, sodium 478 sedimentation rate 83 glucose 115 BUN 34 creatinine 1.59 total bilirubin 1.4 lactic acid 1.2 BNP 316.  Most recent echocardiogram ejection fraction 40 to 45% no wall motion abnormalities.  Follow-up podiatry services for left hallux osteomyelitis underwent left hallux amputation 01/13/2020 per Dr. Caroline More after cardiac clearance was obtained as well as later undergoing arteriography aortogram of left lower extremity per vascular surgeon Dr. Leotis Pain showing relatively normal common femoral artery profunda femoris artery and superficial femoral artery.  The posterior tibial artery was chronically occluded with no distal reconstitution.  Hospital course psychiatry services consulted for possible drug overdose as well as capacity evaluation patient was deemed independent to make medical decisions had been maintained on Haldol for some mild anxiety restlessness.  Bouts of urinary retention initiated Foley catheter tube.  Therapy evaluations completed and patient was admitted for a comprehensive rehab program  Hospital Course: EDRIC FETTERMAN was admitted to rehab 02/03/2020 for inpatient therapies to consist of PT, ST and OT at least three hours five days a week. Past admission physiatrist, therapy team and rehab RN have worked together to provide customized collaborative inpatient rehab.   Pertaining to patient's altered mental status acute metabolic encephalopathy related to possible drug overdose.  Patient was attending therapies.  Initial cranial CT scan negative for acute process.  He remained on Eliquis for history of atrial fibrillation no bleeding episodes as well as low-dose aspirin  with history of CAD with pacemaker.  Heart rate controlled he continued on amiodarone as directed.  Pain managed with use of Neurontin scheduled 3 times daily as well as Cymbalta.  Mood stabilization with scheduled Haldol as well as Zyprexa.  During patient's hospital course at [facility] he did receive follow-up by psychiatry services for capacity evaluation was deemed independent to make medical decisions.  Regards to patient's chronic left hallux osteomyelitis status post left hallux amputation 01/13/2020 per Dr. Caroline More.  Weightbearing as tolerated through left heel only with postoperative shoe.  Needed some cues to maintain weightbearing precautions.  Sutures had been removed.  Slow healing of hallux amputation site as well as left heel ulcer which also had Aquacel applied with foam dressing and he would need close observation with arrangements were made to follow-up with both Dr. Caroline More as well as vascular surgery Dr. Leotis Pain at [facility].  Patient also with left heel decubitus Aquacel was in place with foam change daily.  Blood sugars monitored hemoglobin A1c insulin therapy as directed with full education provided.  CKD stage III admission chemistries 1.59 latest creatinine 1.31 he would follow-up with PCP.  Lipitor for hyperlipidemia.  He exhibited no other signs of fluid overload ejection fraction 40 to 45%.  He remained on Lasix as directed.  Acute on chronic anemia latest hemoglobin 7.3-7.7 with stool sample negative.  Review of records CT of abdomen in the past without GI abnormality.  He did see GI at [facility] 4/20 for lipoma and cecum no  history of GI bleed.  Gastroenterology services while at [facility] did follow-up with Dr. Ardis Hughs.  Rectal exam showed soft golden brown stool trace streak of heme positivity.  No melena.  Recommendations for follow-up EGD and colonoscopy as outpatient with primary gastroenterologist Dr.Wohl. Neal Dy was added to patient's regimen as he was already on Pepcid.  Patient remained asymptomatic.  Patient with history of BPH Flomax ongoing he was encouraged stand to void.  Denied any dysuria.   Blood pressures were monitored on TID basis and controlled  Diabetes has been monitored with ac/hs CBG checks and SSI was use prn for tighter BS control.    Rehab course: During patient's stay in rehab weekly team conferences were held to monitor patient's progress, set goals and discuss barriers to discharge. At admission, patient required minimal guard 5 feet rolling walker minimal assist sit to supine.  Max assist general ADLs.  Physical exam.  Blood pressure 118/58 pulse 78 temperature 97.9 respirations 18 oxygen saturations 92% room air HEENT Head.  Normocephalic and atraumatic Eyes.  Pupils round and reactive to light no discharge without nystagmus Neck.  Supple nontender no JVD without thyromegaly Cardiac regular rate rhythm without extra sounds or murmur heard Abdomen.  Soft nontender positive bowel sounds without rebound Respiratory effort normal no respiratory distress without wheeze Skin.  Left foot dressing in place Neurological cognitively appropriate normal  insight and memory cranial nerves II through XII intact no acute distress.  Oriented to person and place as well as following commands.   He/  has had improvement in activity tolerance, balance, postural control as well as ability to compensate for deficits. He/ has had improvement in functional use RUE/LUE  and RLE/LLE as well as improvement in awareness.  Patient demonstrated good tolerance to all activities.  He was maintaining  his weightbearing status with postoperative shoe.  Sessions with emphasis on functional mobility transfers generalized strengthening.  Demonstrate improvement with overall technique required less cues for even foot placement on stairs for mobility.  Transferred stand pivot contact-guard assist.  Sit to stand rolling walker contact-guard assist.  Ambulates short distances with assistive device.  Patient completed bed mobility supervision.  Stand pivot transfers bed to wheelchair tub bench in room shower with contact-guard during ADLs.  Needed some basic assistance for donning Darco shoe.  Full family teaching completed plan discharge back to boarding home.       Disposition: Discharge to home    Diet: Diabetic diet  Special Instructions: No driving smoking or alcohol  Wound care.  Routine 2 Times Daily Place, Aquacel on left great toe amputation incision and then wrapped with Kerlix.  Change toe dressing twice daily.  Place Aquacel on left heel and top with foam.  Heel changed  Daily  Follow-up Dr.Wohl of gastroenterology services for EGD colonoscopy as outpatient  Medications at discharge 1.  Tylenol as needed 2.  Amiodarone 400 mg p.o. daily 3.  Eliquis 5 mg p.o. twice daily 4.  Aspirin 81 mg p.o. daily 5.  Lipitor 40 mg p.o. nightly 6.  Vitamin D 1000 units p.o. daily 7.  Cymbalta 20 mg p.o. daily 8.  Pepcid 20 mg p.o. daily 9.  Lasix 10 mg p.o. daily 10.  Neurontin 200 mg p.o. 3 times daily 11.  Haldol 1 mg p.o. twice daily 12.  NovoLog 11 units 3 times daily with meals 13.  Lantus insulin 14 units daily 14.  Multivitamin daily 15.  Nitroglycerin as needed 16.  Zyprexa 12.5 mg p.o. nightly 17.  Flomax 0.8 mg p.o. daily 18.  Vitamin B12 1000 mcg p.o. daily 19.  Zinc sulfate 220 mg p.o. daily 20.  Protonix 40 mg daily   30-35 minutes were spent completing discharge summary and discharge planning  Discharge Instructions     Ambulatory referral to Physical Medicine Rehab    Complete by: As directed    Moderate complexity follow-up 1 to 2 weeks metabolic encephalopathy        Follow-up Information     Jamse Arn, MD Follow up.   Specialty: Physical Medicine and Rehabilitation Why: Office to call for appointment Contact information: [address] [place] [address] [phone]         Caroline More, DPM Follow up.   Specialty: Podiatry Why: Call for appointment Contact information: [place] [address] [phone]         Algernon Huxley, MD Follow up.   Specialties: Vascular Surgery, Radiology, Interventional Cardiology Why: Call for appointment Contact information: [place] [address] [phone]         Abbie Sons, MD Follow up.   Specialty: Urology Why: Call for appointment Contact information: [place] [address] [phone]         [facility], [facility] Follow up.   Contact information: [place]  [facility] [place] [address] [phone]         [name], MD Follow up.   Specialty: Gastroenterology Why: call for appointment Contact information: [place]  [place] [address] [phone]                 Signed: Cathlyn Parsons 02/28/2020, 5:27 AM Patient was seen, face-face, and physical exam performed by me on day of discharge, greater than 30 minutes of total time spent.. Please see progress note from day of discharge as well.  Delice Lesch, MD, ABPMR

## 2020-02-23 NOTE — Progress Notes (Addendum)
Physical Therapy Session Note  Patient Details  Name: Jason Montes MRN: [id number] Date of Birth: [birth year]  Today's Date: 02/23/2020 PT Individual Time: [phone] and [phone]  PT Individual Time Calculation (min): 55 min and 70 min  Short Term Goals: Week 3:  PT Short Term Goal 1 (Week 3): pt will perform all bed mobility with CGA consistantly PT Short Term Goal 1 - Progress (Week 3): Met PT Short Term Goal 2 (Week 3): pt will transfer sit<>stand with LRAD and CGA consistantly PT Short Term Goal 2 - Progress (Week 3): Met PT Short Term Goal 3 (Week 3): Pt will ambulate 153f with LRAD CGA PT Short Term Goal 3 - Progress (Week 3): Met Week 4:  PT Short Term Goal 1 (Week 4): STG=LTG due to LOS  Skilled Therapeutic Interventions/Progress Updates:   Treatment Session 1: [id number] min Received pt supine in bed awake reporting he slept great and for 12 hours last night, pt agreeable to therapy, and denied any pain during session. Session with emphasis on functional mobility/transfers, dressing, generalized strengthening, dynamic standing balance/coordination, stair navigation, and improved activity tolerance. Doffed L PRAFO boot and donned R ted hose, non-skid sock, and L Darco shoe with heel protector pad total A for time management purposes. Pt reported his brief was ripped. Doffed ripped brief and donned new one max A for time purposes and pt able to roll L/R and bridge up with supervision. Pt transferred supine<>sitting EOB from flat bed with supervision and use of bedrails and donned R shoe total A. Donned pants sitting EOB with max A for time management purposes and pt transferred sit<>stand with RW from elevated bed with supervision and required max A to pull pants over hips for time management purposes. Stand<>pivot bed<>WC with RW CGA with cues to reach back for WC armrests prior to sitting. Doffed dirty shirt independently and [facility] brushed teeth, washed face, and applied  deodorant with set up assist. Donned clean pull over shirt with supervision. [facility] for time management purposes and navigated 8 steps with L handrail and CGA using a lateral stepping technique ascending and descending with a step to pattern. Pt required cues for technique and foot positioning on step and extensive rest break afterwards. Pt reported feeling too fatigued to ambulate. Worked on dynamic standing balance tossing horseshoes using RW and L UE x 2 trials with min A for balance and cues for anterior weight shifting. Pt reported 9/10 fatigue after activity and requested to "be done." Noted pt's dressing on L toe falling off and soiled. Due to time restrictions, this therapist notified pt's next OT who agreed to change dressing. MD present for morning rounds. [facility] Concluded session with pt sitting in WC, needs within reach, and seatbelt alarm on. Therapist provided fresh drinks for pt.   Treatment Session 2: 1415-1525 70 min Received pt sitting in WC asleep, pt easily aroused and agreeable to therapy, and denied any pain during session but was very fatigued from previous therapies. Session with emphasis on functional mobility/transfers, generalized strengthening, dynamic standing balance/coordination, ambulation, and improved activity tolerance. Pt performed WC mobility 219fusing bilateral UEs and supervision but with increased difficulty steering WC and with increased frustration requesting to be pushed remainder of way. Pt ambulated 14059fith RW and close supervision with mulitple standing rest breaks. Pt performed 3 additional stand<>pivot transfers with RW and CGA throughout session.  Pt performed the following exercises sitting on mat with supervision and verbal cues for technique: -LAQ 2x10 with 2lb ankle weight -hip flexion 2x10 with 2lb ankle weight -hamstring curls with orange TB 2x10 bilaterally -hip abduction with  orange TB 2x12 -hip adduction ball squeezes 2x12 Pt transferred sit<>stand with RW and supervison and worked on dynamic standing balance tapping 2 cones with each LE 2x5 reps bilaterally with close supervision. Pt transferred sit<>stand with RW and close supervision and performed the following exercises standing with bilateral UE support on RW and close supervision for balance: -alternating marching 2x10 -hip abduction 3x5 bilaterally -mini squats x12 Pt reported 8/10 fatigue and requested to return to room. Pt transported back to room in [facility] total A and reported urge to urinate. Pt transferred sit<>stand with RW and able to void in urinal with CGA/min A. Concluded session with pt sitting in WC, needs within reach, and seatbelt alarm on. Therapist provided fresh drinks for pt.   Therapy Documentation Precautions:  Precautions Precautions: Fall, Other (comment) Precaution Comments: LLE WBAT heel only with post-op shoe Required Braces or Orthoses: Other Brace Other Brace: Darco shoe Restrictions Weight Bearing Restrictions: Yes RLE Weight Bearing: Weight bearing as tolerated LLE Weight Bearing: Weight bearing as tolerated LLE Partial Weight Bearing Percentage or Pounds: Darco shoe  Therapy/Group: Individual Therapy [name] PT, DPT   02/23/2020, 7:21 AM

## 2020-02-23 NOTE — Progress Notes (Signed)
Occupational Therapy Session Note  Patient Details  Name: Jason Montes MRN: [id number] Date of Birth: [birth year]  Today's Date: 02/23/2020 OT Individual Time: 0931-1216 OT Individual Time Calculation (min): 73 min    Short Term Goals: Week 1:  OT Short Term Goal 1 (Week 1): Pt will perform toilet transfers with Min A OT Short Term Goal 1 - Progress (Week 1): Progressing toward goal OT Short Term Goal 2 (Week 1): Pt will transition sit to stand with proper body mechanics with Min A in prep for LB ADLs OT Short Term Goal 2 - Progress (Week 1): Met OT Short Term Goal 3 (Week 1): Pt will perform LB dress with AE PRN with Min A OT Short Term Goal 3 - Progress (Week 1): Progressing toward goal OT Short Term Goal 4 (Week 1): Pt will don post surgical shoe and R regular shoe with Min A OT Short Term Goal 4 - Progress (Week 1): Progressing toward goal  Skilled Therapeutic Interventions/Progress Updates:    1:1. Pt received in bed agreeable to OT with no pain requesting to shower. OT applies plastic bag to L foot over darco shoe. Pt requires MOD A to doff LB clothing for energy conservation and S for doffing UB clothing. Pt stand pivot transfers to TTB in shower with CGA. Pt bathes with CGA to wash peri/buttocks otherwise S for bathing other body parts. Pt dons shirt with S and pants with MOD A to thread over RLE second. OT changes bandage after LPN changes auquacell dressing. Pt completes UB strengthening/coordination activities with 3# dowel rod for global strenthening required for BADLs and transfers. Exited session with call light in reach and all needs met.   Therapy Documentation Precautions:  Precautions Precautions: Fall, Other (comment) Precaution Comments: LLE WBAT heel only with post-op shoe Required Braces or Orthoses: Other Brace Other Brace: Darco shoe Restrictions Weight Bearing Restrictions: Yes RLE Weight Bearing: Weight bearing as tolerated LLE Weight Bearing: Weight  bearing as tolerated LLE Partial Weight Bearing Percentage or Pounds: Darco shoe General:   Vital Signs: Therapy Vitals Temp: 98 F (36.7 C) Pulse Rate: 80 Resp: 18 BP: 119/65 Patient Position (if appropriate): Lying Oxygen Therapy SpO2: 92 % O2 Device: Room Air Pain:   ADL: ADL Eating: Independent Where Assessed-Eating: Chair Grooming: Modified independent Where Assessed-Grooming: Sitting at sink Upper Body Bathing: Supervision/safety Where Assessed-Upper Body Bathing: Shower Lower Body Bathing: Supervision/safety Where Assessed-Lower Body Bathing: Shower Upper Body Dressing: Setup Where Assessed-Upper Body Dressing: Wheelchair Lower Body Dressing: Minimal assistance Where Assessed-Lower Body Dressing: Sitting at sink, Standing at sink Toileting: Supervision/safety Where Assessed-Toileting: Glass blower/designer: Close supervision Armed forces technical officer Method: Counselling psychologist: Geophysical data processor: Close supervision Social research officer, government Method: Heritage manager: Radio broadcast assistant, Systems analyst    Praxis   Exercises:   Other Treatments:     Therapy/Group: Individual Therapy  Tonny Branch 02/23/2020, 8:11 AM

## 2020-02-24 ENCOUNTER — Inpatient Hospital Stay (HOSPITAL_COMMUNITY): Payer: Medicare Other

## 2020-02-24 LAB — GLUCOSE, CAPILLARY
Glucose-Capillary: 96 mg/dL (ref 70–99)
Glucose-Capillary: 178 mg/dL — ABNORMAL HIGH (ref 70–99)
Glucose-Capillary: 411 mg/dL — ABNORMAL HIGH (ref 70–99)
Glucose-Capillary: 228 mg/dL — ABNORMAL HIGH (ref 70–99)

## 2020-02-24 NOTE — Progress Notes (Addendum)
Patient ID: Jason Montes, male   DOB: 07/05/1950, 69 y.o.   MRN: [id number]  Message to call Jason Montes-pt's son Montes to discuss transport home on Sunday. He states: " He can walk home for all I care." he nor his wife plan to transport pt home. He reports ome things have come up since he has been here and feels his wife needs to stay away from pt. This worker did not ask what those things were. Jason Montes did say they will take care of his medications and food but that is all. Jason Montes wants him to go [facility] on Sunday. Will talk with pt regarding this.   12:19 PM Met with pt to inform plan to go home via [facility] on Sunday. Informed spoke with Jason Montes and they are not planing on coming. Pt states: "isn't that nice of them." Informed Jason Montes-bedside RN will leave ambulance packet in his cubby and will call on Sunday to see what time ready to go home and schedule ambulance.  

## 2020-02-24 NOTE — Progress Notes (Signed)
Occupational Therapy Session Note  Patient Details  Name: Jason Montes MRN: [id number] Date of Birth: 16-Apr-1951  Today's Date: 02/24/2020 OT Individual Time: 1914-7829 OT Individual Time Calculation (min): 60 min    Short Term Goals: Week 1:  OT Short Term Goal 1 (Week 1): Pt will perform toilet transfers with Min A OT Short Term Goal 1 - Progress (Week 1): Progressing toward goal OT Short Term Goal 2 (Week 1): Pt will transition sit to stand with proper body mechanics with Min A in prep for LB ADLs OT Short Term Goal 2 - Progress (Week 1): Met OT Short Term Goal 3 (Week 1): Pt will perform LB dress with AE PRN with Min A OT Short Term Goal 3 - Progress (Week 1): Progressing toward goal OT Short Term Goal 4 (Week 1): Pt will don post surgical shoe and R regular shoe with Min A OT Short Term Goal 4 - Progress (Week 1): Progressing toward goal  Skilled Therapeutic Interventions/Progress Updates:    1;1. Pt received in bed agreeable to OT. Pt reporting need to go to bathroom. OT applies dressing to L foot and B shoes d/t urgency. Pt completes ambulatory transfer to bathroom with RW and is able to compelte CM prior to toilet but unable ot complete after. OT questions pt to only attempts for about 3 seconds to pull pants up, "what will you do at home." Pt states, "I only need to unzip my pants at home when I pee." Discussed seated toileting for safety d/t balance deficits and need to sit for BM regardless. Pt verbalized understanding. Pt completes UB bathing/dressing/grooming at sink with set up after 1 VC for locating needed items in R visual field. Pt completes LB bathing and dressing with CGA to pull pants past hips and MIN A to don RL darco shoe d/t poor frustration tolerance. Pt reports, "well im not going to wear it at home and Im not going to take my meds for a few days to help it heal." Edu re taking medications as prescribed by Mds and asking questions about healing to PA who will be  in to review DC instructions. Exited session with pt seated in w/c, exit alarm on and call light in reach and all needs met.  Therapy Documentation Precautions:  Precautions Precautions: Fall, Other (comment) Precaution Comments: LLE WBAT heel only with Darco shoe Required Braces or Orthoses: Other Brace Other Brace: Darco shoe Restrictions Weight Bearing Restrictions: Yes RLE Weight Bearing: Weight bearing as tolerated LLE Weight Bearing: Weight bearing as tolerated LLE Partial Weight Bearing Percentage or Pounds: Darco shoe General:   Vital Signs: Therapy Vitals Temp: 98.4 F (36.9 C) Temp Source: Oral Pulse Rate: 78 Resp: 16 BP: 116/63 Patient Position (if appropriate): Lying Oxygen Therapy SpO2: 93 % O2 Device: Room Air Pain:   ADL: ADL Eating: Independent Where Assessed-Eating: Chair Grooming: Modified independent Where Assessed-Grooming: Sitting at sink Upper Body Bathing: Supervision/safety Where Assessed-Upper Body Bathing: Shower Lower Body Bathing: Supervision/safety Where Assessed-Lower Body Bathing: Shower Upper Body Dressing: Setup Where Assessed-Upper Body Dressing: Wheelchair Lower Body Dressing: Minimal assistance Where Assessed-Lower Body Dressing: Sitting at sink, Standing at sink Toileting: Supervision/safety Where Assessed-Toileting: Glass blower/designer: Close supervision Armed forces technical officer Method: Counselling psychologist: Geophysical data processor: Close supervision Social research officer, government Method: Heritage manager: Radio broadcast assistant, Systems analyst    Praxis   Exercises:   Other Treatments:     Therapy/Group: Individual Therapy  Lowella Dell Donta Fuster 02/24/2020, 7:23 AM

## 2020-02-24 NOTE — Progress Notes (Signed)
[place] PHYSICAL MEDICINE & REHABILITATION PROGRESS NOTE   Subjective/Complaints: No complaints this morning. Denies pain, constipation, insomnia.   ROS: Denies CP, SOB, N/V/D  Objective:   No results found. Recent Labs    02/23/20 1249  WBC 8.7  HGB 8.0*  HCT 27.2*  PLT PLATELET CLUMPS NOTED ON SMEAR, COUNT APPEARS ADEQUATE   No results for input(s): NA, K, CL, CO2, GLUCOSE, BUN, CREATININE, CALCIUM in the last 72 hours.  Intake/Output Summary (Last 24 hours) at 02/24/2020 0849 Last data filed at 02/23/2020 1700 Gross per 24 hour  Intake 480 ml  Output --  Net 480 ml     Physical Exam: Vital Signs Blood pressure 116/63, pulse 78, temperature 98.4 F (36.9 C), temperature source Oral, resp. rate 16, weight 81.1 kg, SpO2 93 %.  General: Alert and oriented x 1 (off one day with date, and says he is in doctor's office), No apparent distress HEENT: Head is normocephalic, atraumatic, PERRLA, EOMI, sclera anicteric, oral mucosa pink and moist, dentition intact, ext ear canals clear,  Neck: Supple without JVD or lymphadenopathy Heart: Reg rate and rhythm. No murmurs rubs or gallops Chest: CTA bilaterally without wheezes, rales, or rhonchi; no distress Abdomen: Soft, non-tender, non-distended, bowel sounds positive. Extremities: No clubbing, cyanosis, or edema. Pulses are 2+ Skin: Left foot with serosanguineous drainage at amputation site Left heel ulcer, unstageable Psych: Normal mood.  Normal behavior. Musc: Left hallux amputation with edema and tenderness Left heel ulcer with edema and tenderness Neuro: Alert Motor: 4+/5 throughout B/l UE resting tremors, unchanged  Assessment/Plan: 1. Functional deficits secondary to acute metabolic encephalopathy/drug overdose and hx of multiple CVAs which require 3+ hours per day of interdisciplinary therapy in a comprehensive inpatient rehab setting.  Physiatrist is providing close team supervision and 24 hour management of  active medical problems listed below.  Physiatrist and rehab team continue to assess barriers to discharge/monitor patient progress toward functional and medical goals  Care Tool:  Bathing    Body parts bathed by patient: Right arm, Left arm, Chest, Abdomen, Right upper leg, Left upper leg, Face, Front perineal area, Buttocks, Right lower leg   Body parts bathed by helper: Right lower leg, Buttocks Body parts n/a: Left lower leg   Bathing assist Assist Level: Supervision/Verbal cueing     Upper Body Dressing/Undressing Upper body dressing   What is the patient wearing?: Pull over shirt    Upper body assist Assist Level: Set up assist    Lower Body Dressing/Undressing Lower body dressing      What is the patient wearing?: Underwear/pull up, Pants     Lower body assist Assist for lower body dressing: Contact Guard/Touching assist     Toileting Toileting    Toileting assist Assist for toileting: Contact Guard/Touching assist     Transfers Chair/bed transfer  Transfers assist     Chair/bed transfer assist level: Contact Guard/Touching assist     Locomotion Ambulation   Ambulation assist   Ambulation activity did not occur: Safety/medical concerns  Assist level: Supervision/Verbal cueing Assistive device: Walker-rolling Max distance: 139ft   Walk 10 feet activity   Assist  Walk 10 feet activity did not occur: Safety/medical concerns  Assist level: Supervision/Verbal cueing Assistive device: Walker-rolling   Walk 50 feet activity   Assist Walk 50 feet with 2 turns activity did not occur: Safety/medical concerns  Assist level: Supervision/Verbal cueing Assistive device: Walker-rolling    Walk 150 feet activity   Assist Walk 150 feet activity did not  occur: Safety/medical concerns  Assist level: Contact Guard/Touching assist Assistive device: Walker-rolling    Walk 10 feet on uneven surface  activity   Assist Walk 10 feet on uneven  surfaces activity did not occur: Safety/medical concerns         Wheelchair     Assist Will patient use wheelchair at discharge?: No Type of Wheelchair: Manual    Wheelchair assist level: Supervision/Verbal cueing Max wheelchair distance: 78ft    Wheelchair 50 feet with 2 turns activity    Assist        Assist Level: Supervision/Verbal cueing   Wheelchair 150 feet activity     Assist      Assist Level: Maximal Assistance - Patient 25 - 49%   Blood pressure 116/63, pulse 78, temperature 98.4 F (36.9 C), temperature source Oral, resp. rate 16, weight 81.1 kg, SpO2 93 %.  Medical Problem List and Plan: 1.  Altered mental status secondary to acute metabolic encephalopathy related to possible drug overdose as well as history of multiple CVAs/vascular dementia  Continue CIR 2.  Antithrombotics: -DVT/anticoagulation: Eliquis             -antiplatelet therapy: Aspirin 81 mg daily, Plavix 75 mg daily 3. Pain Management: Neurontin 200 mg 3 times daily, Cymbalta 20 mg daily.   Well controlled 9/24 4. Mood: Provide emotional support             -antipsychotic agents: Haldol 1 mg BID, Zyprexa 12.5 mg nightly  He was seen by psychiatry, notes reviewed-continue supportive care. 5. Neuropsych: This patient is not fully capable of making decisions on his own behalf. 6. Skin/Wound Care: Routine skin checks  DCed Prevalon boot, ordered PRAFO with benefit  Discussed offloading the sacral area  Discussed offloading and pressure relief to heal with offloading shoe 7. Fluids/Electrolytes/Nutrition: Routine in and outs with follow-up chemistries 8.  Atrial fibrillation.  Amiodarone 400 mg daily. Vitals:   02/23/20 1924 02/24/20 0403  BP: 110/60 116/63  Pulse: 79 78  Resp: 20 16  Temp: 98 F (36.7 C) 98.4 F (36.9 C)  SpO2: 95% 93%   HR controlled 9/24 9.  Chronic left hallux osteomyelitis.  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 Caroline More.   Weightbearing as tolerated through left heel only with postoperative shoe  Needs cues to remember WB precautions  Will ask surgery to reevaluate wound as well as heel 10.  Hypertension.  Monitor with increased mobility.   BP controlled 9/24 11.  Diabetes mellitus with peripheral neuropathy.  Hemoglobin A1c 7.9.    NovoLog 6 units 3 times daily with meals, increased to 9 3 times daily on 9/9, increased to 11 3 times daily on 9/14  Diabetic teaching.   Increased Lantus to 14U on 9/7.   CBG (last 3)  Recent Labs    02/23/20 1644 02/23/20 2112 02/24/20 0602  GLUCAP 294* 132* 96   Labile 9/22, monitor for trend 12.  CKD stage III as well as bouts of hyperkalemia.  Admission creatinine 1.59.  Continue Lokelma 5 mg twice daily.  Creatinine 1.31 on 9/17  Encourage fluids 13.  Hyperlipidemia.  Lipitor 14.  CAD with history of pacemaker.  Continue aspirin 15.  Chronic systolic congestive heart failure.  Latest ejection fraction 40 to 45%.  Monitor for any signs of fluid overload   Filed Weights   02/22/20 0500 02/23/20 0417 02/24/20 0500  Weight: 80 kg 81.7 kg 81.1 kg   Trending up on 9/22, Lasix 10 daily  started on 9/22  Monitor BMP closely 16.  Urinary retention.    Increased Flomax to 0.8 mg daily.  17. Orthostatic hypotension: abdominal binder and TEDs ordered  Improving 18.  Thrombocytopenia  Platelets 64 on 9/10 repeat with clumps again, discussed with lab, no improvement with citrate  Discussed with pharmacy - no obvious offending agents 19.  Chronic hyponatremia  Sodium 131 on 9/17  Monitor closely with addition of diuretic 20. UTI  resolved  Completed course of Keflex 21. Acute blood loss anemia  Hemoglobin 8.3 on 9/17, labs ordered for tomorrow 22. Constipation: prune juice with meals.    LOS: 21 days A FACE TO FACE EVALUATION WAS PERFORMED  Jason Montes 02/24/2020, 8:49 AM

## 2020-02-24 NOTE — Progress Notes (Signed)
Inpatient Rehabilitation Care Coordinator  Discharge Note  Discharge location: Yes-Home with home health, pcs services and amy-hired friend to assist  Length of Stay: Yes-25 days  Discharge activity level: Yes-supervision level-min stairs  Home/community participation: Yes  Services provided included: MD, RD, PT, OT, SLP, RN, CM, Pharmacy and SW, [facility]  Financial Services: Medicare and Private Insurance: [place] along with medicaid  Follow-up services arranged: Home Health: [facility] and Patient/Family request agency HH: pref agency, DME: no pref  Comments (or additional information):Pt did well and reached his goals for discharge. Has all equipment from past admissions and will have home health, pcs and hired person-Amy who lives a the boarding house to assist. Pt will need to ambulance home due to neither son in-law or daughter will take home, due to his legal issues.  Patient/Family verbalized understanding of follow-up arrangements: Yes  Individual responsible for coordination of the follow-up plan: [name]-son in-law [phone]  Confirmed correct DME delivered: Elease Hashimoto 02/24/2020    Angeldejesus Callaham, Gardiner Rhyme

## 2020-02-24 NOTE — Progress Notes (Signed)
Physical Therapy Session Note  Patient Details  Name: Jason Montes MRN: [id number] Date of Birth: 15-Mar-1951  Today's Date: 02/24/2020 PT Individual Time: 1100-1155 and 1415-1516 PT Missed Time: 14 minutes due to fatigue  PT Individual Time Calculation (min): 55 min and 61 min  Short Term Goals: Week 3:  PT Short Term Goal 1 (Week 3): pt will perform all bed mobility with CGA consistantly PT Short Term Goal 1 - Progress (Week 3): Met PT Short Term Goal 2 (Week 3): pt will transfer sit<>stand with LRAD and CGA consistantly PT Short Term Goal 2 - Progress (Week 3): Met PT Short Term Goal 3 (Week 3): Pt will ambulate 160ft with LRAD CGA PT Short Term Goal 3 - Progress (Week 3): Met Week 4:  PT Short Term Goal 1 (Week 4): STG=LTG due to LOS  Skilled Therapeutic Interventions/Progress Updates:   Treatment Session 1: 1100-1155 55 min Received pt sitting in WC, pt agreeable to therapy, and denied any pain during session but reported R quad soreness from yesterday's therapies. Therapist provided heat packs for relief. Session with focus on functional mobility/transfers, generalized strengthening, dynamic standing balance/coordination, ambulation, simulated car transfers, and improved activity tolerance. L Darco shoe with heel protector donned during session. Pt transported to ortho gym in [facility] total A and ambulated 17ft on uneven surfaces (ramp) with RW CGA and performed ambulatory car transfer with RW and CGA. Pt transferred sit<>stand with RW and close supervision and picked up small cone from floor with 2 attempts using RW and min A. Pt initially fearful stating "I can't do this" but with encouragement pt able to pick up cone with cues for weight shifting anteriorly. Pt ambulated 38ft with RW and close supervision with 2 standing rest breaks prior to requesting to sit. Pt continues to demonstrate narrow BOS, decreased stride length, step to pattern, and decreased bilateral foot clearance. After  ambulating pt stated "are we done now?" and requested to return to room. Therapist encouraged pt to finish out session and pt agreed. Pt performed the following exercises sitting in WC with supervision and verbal cues for technique: -LAQ 2x10 bilaterally  -hip abduction/adduction 2x10 -hip flexion x10 bilaterally  Pt transported back to room in [facility] total A. Concluded session with pt sitting in WC, needs within reach, and seatbelt alarm on. Therapist provided fresh drinks for pt.   Treatment Session 2: 1415-1516 61 min Received pt sitting in WC asleep, pt easily aroused, agreeable to therapy, and denied any pain during session but reported fatigue. Session with focus on functional mobility/transfers, generalized strengthening, dynamic standing balance/coordination, ambulation, and improved activity tolerance. L Darco shoe with heel protector donned during session. Pt transported outside to entrance of [facility] in [place] total A. Pt transferred sit<>stand with RW and close supervision and ambulated 56ft with RW and close supervision over uneven surfaces (concrete). Pt reported increased soreness in R LE and requested to sit down and rest in the sunshine. Transported pt to [place] spot outside and pt reported his daughter and son in law are not able to pick him up from the hospital and he is leaving in an ambulance. Pt clearly upset by this and therapist provided emotional support and encouragement. Discussed pt's hobbies and ways he can continue to do activities he enjoys upon discharge such as playing computer games. Pt transported to dayroom in [facility] total A and engaged in wii bowling x 5 rounds with emphasis on dynamic sitting balance, UE ROM, and reaching outside BOS. Pt reported  increased fatigue and requested to return to room to use restroom. Pt transported back to room in [facility] total A and transferred stand<>pivot WC<>toilet with bedside commode over top with CGA. Pt able to manage clothing with min A, void, and with  medium sized BM (RN made aware). Pt transferred sit<>stand with RW CGA and required total A for peri-care. Pt politely declined any further therapy secondary to fatigue. Concluded session with pt sitting in WC, needs within reach, and seatbelt alarm on. Therapist provided fresh drinks for pt. 14 minutes missed of skilled physical therapy due to fatigue.   Therapy Documentation Precautions:  Precautions Precautions: Fall, Other (comment) Precaution Comments: LLE WBAT heel only with Darco shoe Required Braces or Orthoses: Other Brace Other Brace: Darco shoe Restrictions Weight Bearing Restrictions: Yes RLE Weight Bearing: Weight bearing as tolerated LLE Weight Bearing: Weight bearing as tolerated LLE Partial Weight Bearing Percentage or Pounds: Darco shoe  Therapy/Group: Individual Therapy [name] PT, DPT   02/24/2020, 7:30 AM

## 2020-02-24 NOTE — Discharge Instructions (Signed)
Inpatient Rehab Discharge Instructions  Jason Montes Discharge date and time: No discharge date for patient encounter.   Activities/Precautions/ Functional Status: Activity: As tolerated Diet: Regular Wound Care: Routine skin checks Functional status:  ___ No restrictions     ___ Walk up steps independently ___ 24/7 supervision/assistance   ___ Walk up steps with assistance ___ Intermittent supervision/assistance  ___ Bathe/dress independently ___ Walk with walker     _x__ Bathe/dress with assistance ___ Walk Independently    ___ Shower independently ___ Walk with assistance    ___ Shower with assistance ___ No alcohol     ___ Return to work/school ________  Special Instructions:  No driving smoking alcohol or illicit drug use  Wound care.  Aquacel on left great toe amputation incision and then wrapped with Kerlix.  Change toe dressing twice daily.  Place Aquacel on left heel and top with foam.  Change daily  Follow-up with Dr. Dr. Caroline More of podiatry services as well as [facility] in regards to left foot wound within 1 week    COMMUNITY REFERRALS UPON DISCHARGE:    Home Health:   PT, OT, RN                 Agency:[facility]                                                   My questions have been answered and I understand these instructions. I will adhere to these goals and the provided educational materials after my discharge from the hospital.  Patient/Caregiver Signature _______________________________ Date __________  Clinician Signature _______________________________________ Date __________  Please bring this form and your medication list with you to all your follow-up doctor's appointments.   Information on my medicine - ELIQUIS (apixaban)  This medication education was reviewed with me or my healthcare representative as part of my  discharge preparation.  The pharmacist that spoke with me during my hospital stay was:  Onnie Boer, RPH-CPP  Why was Eliquis prescribed for you? Eliquis was prescribed for you to reduce the risk of a blood clot forming that can cause a stroke if you have a medical condition called atrial fibrillation (a type of irregular heartbeat).  What do You need to know about Eliquis ? Take your Eliquis TWICE DAILY - one tablet in the morning and one tablet in the evening with or without food. If you have difficulty swallowing the tablet whole please discuss with your pharmacist how to take the medication safely.  Take Eliquis exactly as prescribed by your doctor and DO NOT stop taking Eliquis without talking to the doctor who prescribed the medication.  Stopping may increase your risk of developing a stroke.  Refill your prescription before you run out.  After discharge, you should have regular check-up appointments with your healthcare provider that is prescribing your Eliquis.  In the future your dose may need to be changed if your kidney function or weight changes by a significant amount or as you get older.  What do you do if you miss a dose? If you miss a dose, take it as soon as you remember on the same day and resume taking twice daily.  Do not take more than one dose of ELIQUIS at the  same time to make up a missed dose.  Important Safety Information A possible side effect of Eliquis is bleeding. You should call your healthcare provider right away if you experience any of the following: ? Bleeding from an injury or your nose that does not stop. ? Unusual colored urine (red or dark brown) or unusual colored stools (red or black). ? Unusual bruising for unknown reasons. ? A serious fall or if you hit your head (even if there is no bleeding).  Some medicines may interact with Eliquis and might increase your risk of bleeding or clotting while on Eliquis. To help avoid this, consult your  healthcare provider or pharmacist prior to using any new prescription or non-prescription medications, including herbals, vitamins, non-steroidal anti-inflammatory drugs (NSAIDs) and supplements.  This website has more information on Eliquis (apixaban): http://www.eliquis.com/eliquis/home

## 2020-02-24 NOTE — Progress Notes (Signed)
Patient CBG read at 411.  MD made aware. Ordered to continue with standing order of 11 units and 8 for sliding scale coverage.

## 2020-02-25 ENCOUNTER — Inpatient Hospital Stay (HOSPITAL_COMMUNITY): Payer: Medicare Other | Admitting: Occupational Therapy

## 2020-02-25 ENCOUNTER — Inpatient Hospital Stay (HOSPITAL_COMMUNITY): Payer: Medicare Other | Admitting: Physical Therapy

## 2020-02-25 LAB — GLUCOSE, CAPILLARY
Glucose-Capillary: 88 mg/dL (ref 70–99)
Glucose-Capillary: 212 mg/dL — ABNORMAL HIGH (ref 70–99)
Glucose-Capillary: 228 mg/dL — ABNORMAL HIGH (ref 70–99)
Glucose-Capillary: 138 mg/dL — ABNORMAL HIGH (ref 70–99)

## 2020-02-25 LAB — CBC
HCT: 24.3 % — ABNORMAL LOW (ref 39.0–52.0)
Hemoglobin: 7.5 g/dL — ABNORMAL LOW (ref 13.0–17.0)
MCH: 27.4 pg (ref 26.0–34.0)
MCHC: 30.9 g/dL (ref 30.0–36.0)
MCV: 88.7 fL (ref 80.0–100.0)
RBC: 2.74 MIL/uL — ABNORMAL LOW (ref 4.22–5.81)
RDW: 17.7 % — ABNORMAL HIGH (ref 11.5–15.5)
WBC: 7.8 10*3/uL (ref 4.0–10.5)
nRBC: 0 % (ref 0.0–0.2)

## 2020-02-25 NOTE — Progress Notes (Signed)
[facility] PHYSICAL MEDICINE & REHABILITATION PROGRESS NOTE   Subjective/Complaints: No complaints for me this morning. Therapy reported drop in O2 sats into high 80's with therapy this morning. Pt retrospectively told nurse that he had chest pain last night. sats back into 90's with rest.   ROS: Patient denies fever, rash, sore throat, blurred vision, nausea, vomiting, diarrhea, cough, shortness of breath or chest pain, joint or back pain, headache, or mood change.   Objective:   No results found. Recent Labs    02/23/20 1249  WBC 8.7  HGB 8.0*  HCT 27.2*  PLT PLATELET CLUMPS NOTED ON SMEAR, COUNT APPEARS ADEQUATE   No results for input(s): NA, K, CL, CO2, GLUCOSE, BUN, CREATININE, CALCIUM in the last 72 hours.  Intake/Output Summary (Last 24 hours) at 02/25/2020 0920 Last data filed at 02/24/2020 2344 Gross per 24 hour  Intake --  Output 200 ml  Net -200 ml     Physical Exam: Vital Signs Blood pressure 121/74, pulse 82, temperature 97.9 F (36.6 C), resp. rate 20, weight 81.3 kg, SpO2 98 %.  Constitutional: No distress . Vital signs reviewed. HEENT: EOMI, oral membranes moist Neck: supple Cardiovascular: RRR without murmur. No JVD    Respiratory/Chest: CTA Bilaterally without wheezes or rales. Normal effort    GI/Abdomen: BS +, non-tender, non-distended Ext: no clubbing, cyanosis, or edema Psych: flat but cooperative Skin: Left foot with serosanguineous drainage at amputation site Left heel ulcer, unstageable Psych: Normal mood.  Normal behavior. Musc: Left hallux amputation with edema and tenderness Left heel ulcer with edema and tenderness Neuro: Alert Motor: 4+/5 throughout B/l UE resting tremors, no change  Assessment/Plan: 1. Functional deficits secondary to acute metabolic encephalopathy/drug overdose and hx of multiple CVAs which require 3+ hours per day of interdisciplinary therapy in a comprehensive inpatient rehab setting.  Physiatrist is providing  close team supervision and 24 hour management of active medical problems listed below.  Physiatrist and rehab team continue to assess barriers to discharge/monitor patient progress toward functional and medical goals  Care Tool:  Bathing    Body parts bathed by patient: Right arm, Left arm, Chest, Abdomen, Right upper leg, Left upper leg, Face, Front perineal area, Buttocks, Right lower leg   Body parts bathed by helper: Right lower leg, Buttocks Body parts n/a: Left lower leg   Bathing assist Assist Level: Supervision/Verbal cueing     Upper Body Dressing/Undressing Upper body dressing   What is the patient wearing?: Pull over shirt    Upper body assist Assist Level: Set up assist    Lower Body Dressing/Undressing Lower body dressing      What is the patient wearing?: Underwear/pull up, Pants     Lower body assist Assist for lower body dressing: Contact Guard/Touching assist     Toileting Toileting    Toileting assist Assist for toileting: Moderate Assistance - Patient 50 - 74%     Transfers Chair/bed transfer  Transfers assist     Chair/bed transfer assist level: Supervision/Verbal cueing (RW)     Locomotion Ambulation   Ambulation assist   Ambulation activity did not occur: Safety/medical concerns  Assist level: Supervision/Verbal cueing Assistive device: Walker-rolling Max distance: 151ft   Walk 10 feet activity   Assist  Walk 10 feet activity did not occur: Safety/medical concerns  Assist level: Supervision/Verbal cueing Assistive device: Walker-rolling   Walk 50 feet activity   Assist Walk 50 feet with 2 turns activity did not occur: Safety/medical concerns  Assist level: Supervision/Verbal cueing  Assistive device: Walker-rolling    Walk 150 feet activity   Assist Walk 150 feet activity did not occur: Safety/medical concerns  Assist level: Contact Guard/Touching assist Assistive device: Walker-rolling    Walk 10 feet on  uneven surface  activity   Assist Walk 10 feet on uneven surfaces activity did not occur: Safety/medical concerns   Assist level: Contact Guard/Touching assist Assistive device: Aeronautical engineer Will patient use wheelchair at discharge?: No Type of Wheelchair: Manual    Wheelchair assist level: Supervision/Verbal cueing Max wheelchair distance: 21ft    Wheelchair 50 feet with 2 turns activity    Assist        Assist Level: Supervision/Verbal cueing   Wheelchair 150 feet activity     Assist      Assist Level: Moderate Assistance - Patient 50 - 74%   Blood pressure 121/74, pulse 82, temperature 97.9 F (36.6 C), resp. rate 20, weight 81.3 kg, SpO2 98 %.  Medical Problem List and Plan: 1.  Altered mental status secondary to acute metabolic encephalopathy related to possible drug overdose as well as history of multiple CVAs/vascular dementia  Continue CIR 2.  Antithrombotics: -DVT/anticoagulation: Eliquis             -antiplatelet therapy: Aspirin 81 mg daily, Plavix 75 mg daily 3. Pain Management: Neurontin 200 mg 3 times daily, Cymbalta 20 mg daily.   Controlled 9/25 4. Mood: Provide emotional support             -antipsychotic agents: Haldol 1 mg BID, Zyprexa 12.5 mg nightly  He was seen by psychiatry, notes reviewed-continue supportive care. 5. Neuropsych: This patient is not fully capable of making decisions on his own behalf. 6. Skin/Wound Care: Routine skin checks  DCed Prevalon boot, ordered PRAFO with benefit  Discussed offloading the sacral area  Discussed offloading and pressure relief to heal with offloading shoe 7. Fluids/Electrolytes/Nutrition: Routine in and outs with follow-up chemistries 8.  Atrial fibrillation.  Amiodarone 400 mg daily. Vitals:   02/25/20 0321 02/25/20 0908  BP: 129/61 121/74  Pulse: 81 82  Resp: 17 20  Temp: 97.7 F (36.5 C) 97.9 F (36.6 C)  SpO2: (!) 89% 98%   HR controlled 9/25 9.   Chronic left hallux osteomyelitis.  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 Caroline More.  Weightbearing as tolerated through left heel only with postoperative shoe  Needs cues to remember WB precautions   10.  Hypertension.  Monitor with increased mobility.   BP controlled 9/24 11.  Diabetes mellitus with peripheral neuropathy.  Hemoglobin A1c 7.9.    NovoLog 6 units 3 times daily with meals, increased to 9 3 times daily on 9/9, increased to 11 3 times daily on 9/14  Diabetic teaching.    CBG (last 3)  Recent Labs    02/24/20 1652 02/24/20 2052 02/25/20 0620  GLUCAP 411* 228* 88   Poor control, labile readings  Numbers higher in PM  Increase lantus to 18u q AM 12.  CKD stage III as well as bouts of hyperkalemia.  Admission creatinine 1.59.  Continue Lokelma 5 mg twice daily.  Creatinine 1.31 on 9/17  Encourage fluids 13.  Hyperlipidemia.  Lipitor 14.  CAD with history of pacemaker.  Continue aspirin 15.  Chronic systolic congestive heart failure.  Latest ejection fraction 40 to 45%.  Monitor for any signs of fluid overload   Filed Weights   02/23/20 0417 02/24/20 0500 02/25/20 0500  Weight:  81.7 kg 81.1 kg 81.3 kg   Trending up on 9/22, Lasix 10 daily started on 9/22  Weights stable 9/25, does not appear overoaded, -200cc yesterday 16.  Urinary retention.    Increased Flomax to 0.8 mg daily.  17. Orthostatic hypotension: abdominal binder and TEDs ordered  Improving 18.  Thrombocytopenia  Platelets 64 on 9/10 repeat with clumps again, discussed with lab, no improvement with citrate  Discussed with pharmacy - no obvious offending agents 19.  Chronic hyponatremia  Sodium 131 on 9/17  Monitor closely with addition of diuretic 20. UTI  resolved  Completed course of Keflex 21. Acute blood loss anemia  Hemoglobin 8.3 on 9/17--> 8.0 9/23 22. Constipation: prune juice with meals.  23. Hypoxia, ?Chest pain  -Back to baseline after therapy  -VSS  -will recheck CBC  today as hgb has been slowly dropping  -pt has prn nitro ordered   LOS: 22 days A FACE TO FACE EVALUATION WAS PERFORMED  Meredith Staggers 02/25/2020, 9:20 AM

## 2020-02-25 NOTE — Plan of Care (Signed)
  Problem: Consults Goal: RH GENERAL PATIENT EDUCATION Description: See Patient Education module for education specifics. Outcome: Progressing Goal: Nutrition Consult-if indicated Outcome: Progressing Goal: Diabetes Guidelines if Diabetic/Glucose > 140 Description: If diabetic or lab glucose is > 140 mg/dl - Initiate Diabetes/Hyperglycemia Guidelines & Document Interventions  Outcome: Progressing   Problem: RH BOWEL ELIMINATION Goal: RH STG MANAGE BOWEL WITH ASSISTANCE Description: STG Manage Bowel with  mod I Assistance. Outcome: Progressing Goal: RH STG MANAGE BOWEL W/MEDICATION W/ASSISTANCE Description: STG Manage Bowel with Medication with mod i Assistance. Outcome: Progressing   Problem: RH BLADDER ELIMINATION Goal: RH STG MANAGE BLADDER WITH ASSISTANCE Description: STG Manage Bladder With  mod I Assistance Outcome: Progressing Goal: RH STG MANAGE BLADDER WITH MEDICATION WITH ASSISTANCE Description: STG Manage Bladder With Medication With  mod I Assistance. Outcome: Progressing   Problem: RH SKIN INTEGRITY Goal: RH STG SKIN FREE OF INFECTION/BREAKDOWN Description: Patient will be able to manage skin with min assist Outcome: Progressing Goal: RH STG MAINTAIN SKIN INTEGRITY WITH ASSISTANCE Description: STG Maintain Skin Integrity With min Assistance. Outcome: Progressing Goal: RH STG ABLE TO PERFORM INCISION/WOUND CARE W/ASSISTANCE Description: STG Able To Perform Incision/Wound Care With  min Assistance. Outcome: Progressing   Problem: RH SAFETY Goal: RH STG ADHERE TO SAFETY PRECAUTIONS W/ASSISTANCE/DEVICE Description: STG Adhere to Safety Precautions With cues/reminders  Assistance/Device. Outcome: Progressing Goal: RH STG DECREASED RISK OF FALL WITH ASSISTANCE Description: STG Decreased Risk of Fall With cues/reminders Assistance. Outcome: Progressing   Problem: RH PAIN MANAGEMENT Goal: RH STG PAIN MANAGED AT OR BELOW PT'S PAIN GOAL Description: Pain less than or  equal to 2. Outcome: Progressing   Problem: RH KNOWLEDGE DEFICIT GENERAL Goal: RH STG INCREASE KNOWLEDGE OF SELF CARE AFTER HOSPITALIZATION Description: Patient will be able to manage care with cues/reminders using handouts and educational resources with cues/reminders Outcome: Progressing

## 2020-02-25 NOTE — Progress Notes (Signed)
Physical Therapy Session Note  Patient Details  Name: Jason Montes MRN: [id number] Date of Birth: 10/19/1950  Today's Date: 02/25/2020 PT Individual Time: 0810-0907 PT Individual Time Calculation (min): 57 min   Short Term Goals: Week 4:  PT Short Term Goal 1 (Week 4): STG=LTG due to LOS  Skilled Therapeutic Interventions/Progress Updates:    Pt received supine in bed with RN present and pt agreeable to therapy session. Supine>sitting L EOB, HOB partially elevated and using bedrail, with supervision. Sitting EOB encouraged pt to don shoes without assist but pt reports the lady he has assisting him at home comes over in the morning and can help with this task therefore therapist donned max assist. Pt reports that first thing in AM he has a difficult time getting his balance in standing due to him "falling backwards on the bed" - discussed performing seated anterior reaching task x10reps prior to attempting to stand to see if this improves his balance. Sit>stand EOB>RW with close supervision and no posterior LOB but does require increased time to come to stand. R stand pivot to w/c using RW with close supervision for safety.  Transported to/from gym in w/c for time management and energy conservation. During sit>stand w/c>RW pt pushes up with B UEs on arm rests lacking adequate anterior trunk lean to come to stand. Therapist educated on having pt perform seated anterior trunk lean via pushing stool forward while coming to stand but pt reports increased fear of falling and declines attempting stating "can I just do it my way" with therapist agreeing and cuing to put 1 hand on RW to increase anterior weight shift slightly. Gait training 65ft using RW with close supervision for safety (wearing mask per hospital policy) - while ambulating pt paused and upon questioning stated he was "not good" - therapist provided chair for seated rest break and upon further questioning pt stated he was feeling SOB -  assessed SpO2 88% and HR 91bpm via pulse ox with SpO2 increasing to 92-93% within 1 minute and HR lowering to 85bpm. During seated rest break pt reports he started to experience chest pain - reports onset of chest pain started last night while lying in bed and then talks about his hx of heart surgery and how it is difficult to determine if his chest pain is something concerning or not. Pt reports chest pain symptoms dissipated and agreeable to ambulate back to w/c. Gait ~10ft again using RW with close supervision (removed mask due to low SpO2 with prior walk) and pt while walking pt states he feels better this walk - reassessed vitals at end SpO2 83% and HR 130bpm and within 1 minute updated to SpO2 91% and HR 86bpm with pt then stating he is having onset of chest pain again and then states it was happening on/off while walking but that it is difficult for him to determine if he is having chest pain when performing multiple tasks due to impaired divided attention. Transported pt back to room in w/c and notified Tomeka, RN of pt's SpO2 and HR during therapy.   Of note: pt also coughing this AM with pt stating he feels his coughing is more frequent.  Therapy Documentation Precautions:  Precautions Precautions: Fall, Other (comment) Precaution Comments: LLE WBAT heel only with Darco shoe Required Braces or Orthoses: Other Brace Other Brace: Darco shoe Restrictions Weight Bearing Restrictions: Yes RLE Weight Bearing: Weight bearing as tolerated LLE Weight Bearing: Weight bearing as tolerated LLE Partial Weight Bearing Percentage or  Pounds: Darco shoe  Pain: No reports of pain throughout session. Some drainage noted through L LE bandage - RN aware.   Therapy/Group: Individual Therapy  Tawana Scale , PT, DPT, CSRS  02/25/2020, 7:47 AM

## 2020-02-25 NOTE — Progress Notes (Signed)
Occupational Therapy Session Note  Patient Details  Name: Jason Montes MRN: [id number] Date of Birth: August 03, 1950  Today's Date: 02/25/2020 OT Individual Time: 7711-6579 OT Individual Time Calculation (min): 40 min    Short Term Goals: Week 3:  OT Short Term Goal 1 (Week 3): STG = LTGs due to remaining LOS  Skilled Therapeutic Interventions/Progress Updates:    Pt sitting up in w/c asleep.  OT able to arouse with TCs and VCs, and pt agreeable to OT session.  No c/o pain at this time.  Pt requesting to wash UB at sink however declining LB bathing/dressing.  Pt completed UB bathing and dressing with supervision.  Pt did donn/doff sock and shoe with min assist.  Pt ambulated to bathroom using RW and completed toilet transfer with CGA.  Pt completed clothing mgt and simulated pericare with min assist due to no need for bowel or bladder episode.  Pt ambulated to bedside and stand to sit at w/c with CGA.  Call bell in reach, seat belt alarm on.    Therapy Documentation Precautions:  Precautions Precautions: Fall, Other (comment) Precaution Comments: LLE WBAT heel only with Darco shoe Required Braces or Orthoses: Other Brace Other Brace: Darco shoe Restrictions Weight Bearing Restrictions: Yes RLE Weight Bearing: Weight bearing as tolerated LLE Weight Bearing: Weight bearing as tolerated LLE Partial Weight Bearing Percentage or Pounds:  (post op shoe)   Therapy/Group: Individual Therapy  Ezekiel Slocumb 02/25/2020, 4:42 PM

## 2020-02-26 LAB — CBC
HCT: 24 % — ABNORMAL LOW (ref 39.0–52.0)
Hemoglobin: 7.3 g/dL — ABNORMAL LOW (ref 13.0–17.0)
MCH: 26.8 pg (ref 26.0–34.0)
MCHC: 30.4 g/dL (ref 30.0–36.0)
MCV: 88.2 fL (ref 80.0–100.0)
Platelets: ADEQUATE 10*3/uL (ref 150–400)
RBC: 2.72 MIL/uL — ABNORMAL LOW (ref 4.22–5.81)
RDW: 17.5 % — ABNORMAL HIGH (ref 11.5–15.5)
WBC: 9.2 10*3/uL (ref 4.0–10.5)
nRBC: 0 % (ref 0.0–0.2)

## 2020-02-26 LAB — GLUCOSE, CAPILLARY
Glucose-Capillary: 125 mg/dL — ABNORMAL HIGH (ref 70–99)
Glucose-Capillary: 183 mg/dL — ABNORMAL HIGH (ref 70–99)
Glucose-Capillary: 217 mg/dL — ABNORMAL HIGH (ref 70–99)
Glucose-Capillary: 205 mg/dL — ABNORMAL HIGH (ref 70–99)

## 2020-02-26 LAB — OCCULT BLOOD X 1 CARD TO LAB, STOOL
Fecal Occult Bld: NEGATIVE
Fecal Occult Bld: POSITIVE — AB

## 2020-02-26 MED ORDER — PANTOPRAZOLE SODIUM 40 MG PO TBEC
40.0000 mg | DELAYED_RELEASE_TABLET | Freq: Every day | ORAL | Status: DC
  Administered 2020-02-26 – 2020-02-28 (×3): 40 mg via ORAL
  Filled 2020-02-26 (×3): qty 1

## 2020-02-26 MED ORDER — FUROSEMIDE 20 MG PO TABS
20.0000 mg | ORAL_TABLET | Freq: Once | ORAL | Status: AC
  Administered 2020-02-26: 20 mg via ORAL
  Filled 2020-02-26: qty 1

## 2020-02-26 NOTE — Plan of Care (Signed)
  Problem: Consults Goal: RH GENERAL PATIENT EDUCATION Description: See Patient Education module for education specifics. Outcome: Progressing Goal: Nutrition Consult-if indicated Outcome: Progressing Goal: Diabetes Guidelines if Diabetic/Glucose > 140 Description: If diabetic or lab glucose is > 140 mg/dl - Initiate Diabetes/Hyperglycemia Guidelines & Document Interventions  Outcome: Progressing   Problem: RH BOWEL ELIMINATION Goal: RH STG MANAGE BOWEL WITH ASSISTANCE Description: STG Manage Bowel with  mod I Assistance. Outcome: Progressing Goal: RH STG MANAGE BOWEL W/MEDICATION W/ASSISTANCE Description: STG Manage Bowel with Medication with mod i Assistance. Outcome: Progressing   Problem: RH BLADDER ELIMINATION Goal: RH STG MANAGE BLADDER WITH ASSISTANCE Description: STG Manage Bladder With  mod I Assistance Outcome: Progressing Goal: RH STG MANAGE BLADDER WITH MEDICATION WITH ASSISTANCE Description: STG Manage Bladder With Medication With  mod I Assistance. Outcome: Progressing   Problem: RH SKIN INTEGRITY Goal: RH STG SKIN FREE OF INFECTION/BREAKDOWN Description: Patient will be able to manage skin with min assist Outcome: Progressing Goal: RH STG MAINTAIN SKIN INTEGRITY WITH ASSISTANCE Description: STG Maintain Skin Integrity With min Assistance. Outcome: Progressing Goal: RH STG ABLE TO PERFORM INCISION/WOUND CARE W/ASSISTANCE Description: STG Able To Perform Incision/Wound Care With  min Assistance. Outcome: Progressing   Problem: RH SAFETY Goal: RH STG ADHERE TO SAFETY PRECAUTIONS W/ASSISTANCE/DEVICE Description: STG Adhere to Safety Precautions With cues/reminders  Assistance/Device. Outcome: Progressing Goal: RH STG DECREASED RISK OF FALL WITH ASSISTANCE Description: STG Decreased Risk of Fall With cues/reminders Assistance. Outcome: Progressing   Problem: RH PAIN MANAGEMENT Goal: RH STG PAIN MANAGED AT OR BELOW PT'S PAIN GOAL Description: Pain less than or  equal to 2. Outcome: Progressing   Problem: RH KNOWLEDGE DEFICIT GENERAL Goal: RH STG INCREASE KNOWLEDGE OF SELF CARE AFTER HOSPITALIZATION Description: Patient will be able to manage care with cues/reminders using handouts and educational resources with cues/reminders Outcome: Progressing

## 2020-02-26 NOTE — Progress Notes (Signed)
Physical Therapy Session Note  Patient Details  Name: Jason Montes MRN: [id number] Date of Birth: [birth year]  Today's Date: 02/25/2020 PT Individual Time: 1115-1155   40 min   Short Term Goals: Week 4:  PT Short Term Goal 1 (Week 4): STG=LTG due to LOS  Skilled Therapeutic Interventions/Progress Updates:      Pt received sitting in WC and agreeable to PT. Pt extremely lethargic with difficulty remaining aroused through the first 30 minutes of session and was noted to fall asleep mid sentence. PT assessed orthostatic VS. Sitting. 113/64. Standing. 94/54. Sitting 102/57. Supine 105/61. HR 75bpm throughout.  Stand pivot transfer to [facility] without assist, but significantly increased time SpO2 noted to drop to 84% intermittently with and without activity, then SpO2 assessed on different finger at 99%. NT then present to assess blood glucose, 212. Sit>supine with without assist from PT and increased time. Left in bed with call bell in reach with all needs met.    Therapy Documentation Precautions:  Precautions Precautions: Fall, Other (comment) Precaution Comments: LLE WBAT heel only with Darco shoe Required Braces or Orthoses: Other Brace Other Brace: Darco shoe Restrictions Weight Bearing Restrictions: Yes RLE Weight Bearing: Weight bearing as tolerated LLE Weight Bearing: Weight bearing as tolerated LLE Partial Weight Bearing Percentage or Pounds:  (post op shoe) Pain: Denies   Therapy/Group: Individual Therapy  Lorie Phenix 02/26/2020, 4:49 AM

## 2020-02-26 NOTE — Progress Notes (Addendum)
PHYSICAL MEDICINE & REHABILITATION PROGRESS NOTE   Subjective/Complaints: Pt without new complaints. Hgb has continued to drift downward. Denies dizziness, tachycardia, blood in stool  ROS: Patient denies fever, rash, sore throat, blurred vision, nausea, vomiting, diarrhea, cough, shortness of breath or chest pain, joint or back pain, headache, or mood change.    Objective:   No results found. Recent Labs    02/23/20 1249 02/23/20 1249 02/25/20 1425 02/26/20 0511  WBC 8.7   < > 7.8 9.2  HGB 8.0*   < > 7.5* 7.3*  HCT 27.2*   < > 24.3* 24.0*  PLT PLATELET CLUMPS NOTED ON SMEAR, COUNT APPEARS ADEQUATE  --   --  PLATELET CLUMPS NOTED ON SMEAR, COUNT APPEARS ADEQUATE   < > = values in this interval not displayed.   No results for input(s): NA, K, CL, CO2, GLUCOSE, BUN, CREATININE, CALCIUM in the last 72 hours.  Intake/Output Summary (Last 24 hours) at 02/26/2020 0823 Last data filed at 02/25/2020 0925 Gross per 24 hour  Intake --  Output 400 ml  Net -400 ml     Physical Exam: Vital Signs Blood pressure 121/61, pulse 85, temperature 98.3 F (36.8 C), resp. rate 16, weight 83.2 kg, SpO2 93 %.  Constitutional: No distress . Vital signs reviewed. HEENT: EOMI, oral membranes moist Neck: supple Cardiovascular: RRR without murmur. No JVD    Respiratory/Chest: CTA Bilaterally without wheezes or rales. Normal effort    GI/Abdomen: BS +, non-tender, non-distended Ext: no clubbing, cyanosis, or edema Psych: pleasant and cooperative Skin: Left foot with minimal serosanguineous drainage at amputation site Left heel ulcer, unstageable Psych: Normal mood.  Normal behavior. Musc: Left hallux amputation with edema and tenderness Left heel ulcer with edema and tenderness Neuro: Alert Motor: 4+/5 throughout B/l UE resting tremors, stable  Assessment/Plan: 1. Functional deficits secondary to acute metabolic encephalopathy/drug overdose and hx of multiple CVAs which require 3+  hours per day of interdisciplinary therapy in a comprehensive inpatient rehab setting.  Physiatrist is providing close team supervision and 24 hour management of active medical problems listed below.  Physiatrist and rehab team continue to assess barriers to discharge/monitor patient progress toward functional and medical goals  Care Tool:  Bathing    Body parts bathed by patient: Right arm, Left arm, Chest, Abdomen, Face   Body parts bathed by helper: Right lower leg, Buttocks Body parts n/a: Front perineal area, Buttocks, Right upper leg, Left upper leg, Right lower leg, Left lower leg   Bathing assist Assist Level: Supervision/Verbal cueing     Upper Body Dressing/Undressing Upper body dressing   What is the patient wearing?: Pull over shirt    Upper body assist Assist Level: Set up assist    Lower Body Dressing/Undressing Lower body dressing      What is the patient wearing?: Underwear/pull up, Pants     Lower body assist Assist for lower body dressing: Contact Guard/Touching assist     Toileting Toileting    Toileting assist Assist for toileting: Minimal Assistance - Patient > 75%     Transfers Chair/bed transfer  Transfers assist     Chair/bed transfer assist level: Supervision/Verbal cueing (RW)     Locomotion Ambulation   Ambulation assist   Ambulation activity did not occur: Safety/medical concerns  Assist level: Supervision/Verbal cueing Assistive device: Walker-rolling Max distance: 180ft   Walk 10 feet activity   Assist  Walk 10 feet activity did not occur: Safety/medical concerns  Assist level: Supervision/Verbal cueing Assistive  device: Walker-rolling   Walk 50 feet activity   Assist Walk 50 feet with 2 turns activity did not occur: Safety/medical concerns  Assist level: Supervision/Verbal cueing Assistive device: Walker-rolling    Walk 150 feet activity   Assist Walk 150 feet activity did not occur: Safety/medical  concerns  Assist level: Contact Guard/Touching assist Assistive device: Walker-rolling    Walk 10 feet on uneven surface  activity   Assist Walk 10 feet on uneven surfaces activity did not occur: Safety/medical concerns   Assist level: Contact Guard/Touching assist Assistive device: Aeronautical engineer Will patient use wheelchair at discharge?: No Type of Wheelchair: Manual    Wheelchair assist level: Supervision/Verbal cueing Max wheelchair distance: 50ft    Wheelchair 50 feet with 2 turns activity    Assist        Assist Level: Supervision/Verbal cueing   Wheelchair 150 feet activity     Assist      Assist Level: Moderate Assistance - Patient 50 - 74%   Blood pressure 121/61, pulse 85, temperature 98.3 F (36.8 C), resp. rate 16, weight 83.2 kg, SpO2 93 %.  Medical Problem List and Plan: 1.  Altered mental status secondary to acute metabolic encephalopathy related to possible drug overdose as well as history of multiple CVAs/vascular dementia  Continue CIR  -hold discharge today given worsening anemia, see below. Pt aware 2.  Antithrombotics: -DVT/anticoagulation: Eliquis             -antiplatelet therapy: Aspirin 81 mg daily, Plavix 75 mg daily 3. Pain Management: Neurontin 200 mg 3 times daily, Cymbalta 20 mg daily.   Controlled 9/26 4. Mood: Provide emotional support             -antipsychotic agents: Haldol 1 mg BID, Zyprexa 12.5 mg nightly  He was seen by psychiatry, notes reviewed-continue supportive care. 5. Neuropsych: This patient is not fully capable of making decisions on his own behalf. 6. Skin/Wound Care: Routine skin checks  DCed Prevalon boot, ordered PRAFO with benefit  Discussed offloading the sacral area  Discussed offloading and pressure relief to heal with offloading shoe 7. Fluids/Electrolytes/Nutrition: Routine in and outs with follow-up chemistries 8.  Atrial fibrillation.  Amiodarone 400 mg  daily. Vitals:   02/25/20 2042 02/26/20 0608  BP: 132/68 121/61  Pulse: 79 85  Resp: 18 16  Temp: (!) 97.4 F (36.3 C) 98.3 F (36.8 C)  SpO2: 96% 93%   HR controlled 9/26 9.  Chronic left hallux osteomyelitis.  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 Caroline More.  Weightbearing as tolerated through left heel only with postoperative shoe  Needs cues to remember WB precautions   10.  Hypertension.  Monitor with increased mobility.   BP controlled 9/26 11.  Diabetes mellitus with peripheral neuropathy.  Hemoglobin A1c 7.9.    NovoLog 6 units 3 times daily with meals, increased to 9 3 times daily on 9/9, increased to 11 3 times daily on 9/14  Diabetic teaching.    CBG (last 3)  Recent Labs    02/25/20 1732 02/25/20 2105 02/26/20 0611  GLUCAP 228* 138* 125*   Poor control, labile readings  Numbers higher in PM  Increased lantus to 18u q AM 9/25--observe 12.  CKD stage III as well as bouts of hyperkalemia.  Admission creatinine 1.59.  Continue Lokelma 5 mg twice daily.  Creatinine 1.31 on 9/17  Encourage fluids 13.  Hyperlipidemia.  Lipitor 14.  CAD with history of  pacemaker.  Continue aspirin 15.  Chronic systolic congestive heart failure.  Latest ejection fraction 40 to 45%.  Monitor for any signs of fluid overload   Filed Weights   02/24/20 0500 02/25/20 0500 02/26/20 0441  Weight: 81.1 kg 81.3 kg 83.2 kg   Trending up on 9/22, Lasix 10 daily started on 9/22  Weights stable 9/25, up to 83 9/26--question accuracy, pt -400cc yesterday 16.  Urinary retention.    Increased Flomax to 0.8 mg daily.  17. Orthostatic hypotension: abdominal binder and TEDs ordered  Improving 18.  Thrombocytopenia  Platelets 64 on 9/26 repeat with clumps again  19.  Chronic hyponatremia  Sodium 131 on 9/17  Monitor closely with addition of diuretic 20. UTI  resolved  Completed course of Keflex 21. Acute blood loss anemia on anemia of chronic disease  Hemoglobin 8.3 on 9/17-->  8.0 9/23--> 7.3 today  -stool not yet checked for OB  -spoke with RN about needing a specimen today  -may need GI consult if sample positive  -CT abdomen from 04/1999 without GI abnl  -saw [facility] 4/20 for lipoma in cecum, no hx of GIB  -he's on pepcid, add protonix today 22. Constipation: prune juice with meals.  23. Hypoxia, ?Chest pain 9/25  9/26-Back to baseline after therapy---resolved  -VSS today  -may have been related to dropping hgb  -pt has prn nitro ordered   LOS: 23 days A FACE TO Joiner 02/26/2020, 8:23 AM

## 2020-02-26 NOTE — Progress Notes (Signed)
Addendum:  Stool sample is OB negative.    Hgb drop may just be dilutional in the setting of his chronic anemia. (weight is up 5kg from 9/18). Will give addnl 20mg  lasix today.   Repeat stool sample after dinner this evening. Recheck CBC and BMET in AM  Should be able to discharge tomorrow morning.

## 2020-02-27 ENCOUNTER — Ambulatory Visit (INDEPENDENT_AMBULATORY_CARE_PROVIDER_SITE_OTHER): Payer: Self-pay | Admitting: Nurse Practitioner

## 2020-02-27 ENCOUNTER — Encounter (INDEPENDENT_AMBULATORY_CARE_PROVIDER_SITE_OTHER): Payer: Self-pay

## 2020-02-27 LAB — GLUCOSE, CAPILLARY
Glucose-Capillary: 110 mg/dL — ABNORMAL HIGH (ref 70–99)
Glucose-Capillary: 130 mg/dL — ABNORMAL HIGH (ref 70–99)
Glucose-Capillary: 233 mg/dL — ABNORMAL HIGH (ref 70–99)
Glucose-Capillary: 96 mg/dL (ref 70–99)

## 2020-02-27 LAB — CBC
HCT: 25.7 % — ABNORMAL LOW (ref 39.0–52.0)
Hemoglobin: 7.7 g/dL — ABNORMAL LOW (ref 13.0–17.0)
MCH: 26.6 pg (ref 26.0–34.0)
MCHC: 30 g/dL (ref 30.0–36.0)
MCV: 88.9 fL (ref 80.0–100.0)
Platelets: UNDETERMINED 10*3/uL (ref 150–400)
RBC: 2.89 MIL/uL — ABNORMAL LOW (ref 4.22–5.81)
RDW: 17.8 % — ABNORMAL HIGH (ref 11.5–15.5)
WBC: 8 10*3/uL (ref 4.0–10.5)
nRBC: 0 % (ref 0.0–0.2)

## 2020-02-27 MED ORDER — PANTOPRAZOLE SODIUM 40 MG PO TBEC: 40.0000 mg | DELAYED_RELEASE_TABLET | Freq: Every day | ORAL | 0 refills | Status: AC

## 2020-02-27 NOTE — Progress Notes (Signed)
Physical Therapy Note  Patient Details  Name: Jason Montes MRN: [id number] Date of Birth: January 18, 1951 Today's Date: 02/27/2020    Patient seated in w/c with bags packed asking about d/c upon PT entry. Per RN, patient is awaiting medical clearance for d/c, no current update on d/c date or time. Informed patient, he stated understanding, however, expressed frustration. Discussed d/c planning and offerred to perform car transfer, HEP, or other functional mobility. Patient declined all therapeutic interventions at this time, stated, "I feel ready to go home." Will continue to monitor patient's d/c status and re-attempt therapy as able. Patient in w/c with all needs in reach upon PT departure.   Dia Jefferys L Chaz Ronning PT, DPT  02/27/2020, 10:14 AM

## 2020-02-27 NOTE — Progress Notes (Addendum)
Patient ID: Jason Montes, male   DOB: [birth year], 69 y.o.   MRN: [id number]  According to Dan-Pa medical issues and awaiting lab work and will await MD input regarding discharge date.  1:27 PM According to MD pt can discharge tomorrow pt aware of this plan.

## 2020-02-27 NOTE — Progress Notes (Signed)
Occupational Therapy Session Note  Patient Details  Name: Jason Montes MRN: [id number] Date of Birth: 1950-12-06   Skilled Therapeutic Interventions/Progress Updates:    Pt greeted at time of session sitting up in wheelchair resting comfortably. Offered OT session and pt politely declined. Offered ADL, there ex, standing, etc. And pt continued to decline stating he was "done" and just ready to go home and did not want any more therapy at this time; stating he felt ready and prepared to go home. Pt left up in wheelchair with alarm in tact and all needs met.   Therapy Documentation Precautions:  Precautions Precautions: Fall, Other (comment) Precaution Comments: LLE WBAT heel only with Darco shoe Required Braces or Orthoses: Other Brace Other Brace: Darco shoe Restrictions Weight Bearing Restrictions: Yes RLE Weight Bearing: Weight bearing as tolerated LLE Weight Bearing: Weight bearing as tolerated LLE Partial Weight Bearing Percentage or Pounds:  (post op shoe)    Therapy/Group: Individual Therapy  Viona Gilmore 02/27/2020, 3:49 PM

## 2020-02-27 NOTE — Progress Notes (Signed)
[place] PHYSICAL MEDICINE & REHABILITATION PROGRESS NOTE   Subjective/Complaints:   Pt asking if he's leaving or not- I explained we were waiting for labs as well as GI likekyl due to (+)stool sample for blood.   ROS:  Pt denies SOB, abd pain, CP, N/V/C/D, and vision changes   Objective:   No results found. Recent Labs    02/26/20 0511 02/27/20 0719  WBC 9.2 8.0  HGB 7.3* 7.7*  HCT 24.0* 25.7*  PLT PLATELET CLUMPS NOTED ON SMEAR, COUNT APPEARS ADEQUATE PLATELET CLUMPS NOTED ON SMEAR, UNABLE TO ESTIMATE   No results for input(s): NA, K, CL, CO2, GLUCOSE, BUN, CREATININE, CALCIUM in the last 72 hours.  Intake/Output Summary (Last 24 hours) at 02/27/2020 2007 Last data filed at 02/27/2020 1755 Gross per 24 hour  Intake 684 ml  Output 400 ml  Net 284 ml     Physical Exam: Vital Signs Blood pressure 123/71, pulse 76, temperature 97.7 F (36.5 C), resp. rate 18, weight 82.6 kg, SpO2 100 %.  Constitutional: laying in bed- appropriate, NAD HEENT: EOMI, oral membranes moist Neck: supple Cardiovascular: RRR Respiratory/Chest: CTA B/L- no W/R/R- good air movement   GI/Abdomen: Soft, NT, ND, (+)BS  Ext: no clubbing, cyanosis, or edema Psych: pleasant and cooperative Skin: Left foot with minimal serosanguineous drainage at amputation site Left heel ulcer, unstageable Psych: Normal mood.  Normal behavior. Musc: Left hallux amputation with edema and tenderness Left heel ulcer with edema and tenderness Neuro: Alert Motor: 4+/5 throughout B/l UE resting tremors, stable  Assessment/Plan: 1. Functional deficits secondary to acute metabolic encephalopathy/drug overdose and hx of multiple CVAs which require 3+ hours per day of interdisciplinary therapy in a comprehensive inpatient rehab setting.  Physiatrist is providing close team supervision and 24 hour management of active medical problems listed below.  Physiatrist and rehab team continue to assess barriers to  discharge/monitor patient progress toward functional and medical goals  Care Tool:  Bathing    Body parts bathed by patient: Right arm, Left arm, Chest, Abdomen, Face   Body parts bathed by helper: Right lower leg, Buttocks Body parts n/a: Front perineal area, Buttocks, Right upper leg, Left upper leg, Right lower leg, Left lower leg   Bathing assist Assist Level: Supervision/Verbal cueing     Upper Body Dressing/Undressing Upper body dressing   What is the patient wearing?: Pull over shirt    Upper body assist Assist Level: Set up assist    Lower Body Dressing/Undressing Lower body dressing      What is the patient wearing?: Underwear/pull up, Pants     Lower body assist Assist for lower body dressing: Contact Guard/Touching assist     Toileting Toileting    Toileting assist Assist for toileting: Minimal Assistance - Patient > 75%     Transfers Chair/bed transfer  Transfers assist     Chair/bed transfer assist level: Supervision/Verbal cueing (RW)     Locomotion Ambulation   Ambulation assist   Ambulation activity did not occur: Safety/medical concerns  Assist level: Supervision/Verbal cueing Assistive device: Walker-rolling Max distance: 133ft   Walk 10 feet activity   Assist  Walk 10 feet activity did not occur: Safety/medical concerns  Assist level: Supervision/Verbal cueing Assistive device: Walker-rolling   Walk 50 feet activity   Assist Walk 50 feet with 2 turns activity did not occur: Safety/medical concerns  Assist level: Supervision/Verbal cueing Assistive device: Walker-rolling    Walk 150 feet activity   Assist Walk 150 feet activity did not occur:  Safety/medical concerns  Assist level: Contact Guard/Touching assist Assistive device: Walker-rolling    Walk 10 feet on uneven surface  activity   Assist Walk 10 feet on uneven surfaces activity did not occur: Safety/medical concerns   Assist level: Contact  Guard/Touching assist Assistive device: Aeronautical engineer Will patient use wheelchair at discharge?: No Type of Wheelchair: Manual    Wheelchair assist level: Supervision/Verbal cueing Max wheelchair distance: 53ft    Wheelchair 50 feet with 2 turns activity    Assist        Assist Level: Supervision/Verbal cueing   Wheelchair 150 feet activity     Assist      Assist Level: Moderate Assistance - Patient 50 - 74%   Blood pressure 123/71, pulse 76, temperature 97.7 F (36.5 C), resp. rate 18, weight 82.6 kg, SpO2 100 %.  Medical Problem List and Plan: 1.  Altered mental status secondary to acute metabolic encephalopathy related to possible drug overdose as well as history of multiple CVAs/vascular dementia  Continue CIR  -hold discharge today given worsening anemia, see below. Pt aware  9/27- d/c tomorrow if sees GI today, which he did- per GI, safe for d/c and outpatient w/u.  2.  Antithrombotics: -DVT/anticoagulation: Eliquis             -antiplatelet therapy: Aspirin 81 mg daily, Plavix 75 mg daily 3. Pain Management: Neurontin 200 mg 3 times daily, Cymbalta 20 mg daily.   9/27- pain controlled- con't regimen 4. Mood: Provide emotional support             -antipsychotic agents: Haldol 1 mg BID, Zyprexa 12.5 mg nightly  He was seen by psychiatry, notes reviewed-continue supportive care. 5. Neuropsych: This patient is not fully capable of making decisions on his own behalf. 6. Skin/Wound Care: Routine skin checks  DCed Prevalon boot, ordered PRAFO with benefit  Discussed offloading the sacral area  Discussed offloading and pressure relief to heal with offloading shoe 7. Fluids/Electrolytes/Nutrition: Routine in and outs with follow-up chemistries 8.  Atrial fibrillation.  Amiodarone 400 mg daily. Vitals:   02/27/20 1427 02/27/20 1928  BP: (!) 109/57 123/71  Pulse: 71 76  Resp: 18 18  Temp: 97.7 F (36.5 C)   SpO2: 98% 100%    9/27- HR 70s- con't regimen 9.  Chronic left hallux osteomyelitis.  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 Caroline More.  Weightbearing as tolerated through left heel only with postoperative shoe  Needs cues to remember WB precautions   10.  Hypertension.  Monitor with increased mobility.   BP controlled 9/26 11.  Diabetes mellitus with peripheral neuropathy.  Hemoglobin A1c 7.9.    NovoLog 6 units 3 times daily with meals, increased to 9 3 times daily on 9/9, increased to 11 3 times daily on 9/14  Diabetic teaching.    CBG (last 3)  Recent Labs    02/27/20 0615 02/27/20 1120 02/27/20 1649  GLUCAP 110* 130* 233*   Poor control, labile readings  Numbers higher in PM  Increased lantus to 18u q AM 9/25--observe  9/27- BG's better control- x1 over 200- con't regimen 12.  CKD stage III as well as bouts of hyperkalemia.  Admission creatinine 1.59.  Continue Lokelma 5 mg twice daily.  Creatinine 1.31 on 9/17  Encourage fluids 13.  Hyperlipidemia.  Lipitor 14.  CAD with history of pacemaker.  Continue aspirin 15.  Chronic systolic congestive heart failure.  Latest ejection fraction 40  to 45%.  Monitor for any signs of fluid overload   Filed Weights   02/25/20 0500 02/26/20 0441 02/27/20 0500  Weight: 81.3 kg 83.2 kg 82.6 kg   Trending up on 9/22, Lasix 10 daily started on 9/22  Weights stable 9/25, up to 83 9/26--question accuracy, pt -400cc yesterday 16.  Urinary retention.    Increased Flomax to 0.8 mg daily.  17. Orthostatic hypotension: abdominal binder and TEDs ordered  Improving 18.  Thrombocytopenia  Platelets 64 on 9/26 repeat with clumps again  19.  Chronic hyponatremia  Sodium 131 on 9/17  Monitor closely with addition of diuretic 20. UTI  resolved  Completed course of Keflex 21. Acute blood loss anemia on anemia of chronic disease  Hemoglobin 8.3 on 9/17--> 8.0 9/23--> 7.3 today  -stool not yet checked for OB  -spoke with RN about needing a specimen  today  -may need GI consult if sample positive  -CT abdomen from 04/1999 without GI abnl  -saw [facility] 4/20 for lipoma in cecum, no hx of GIB  -he's on pepcid, add protonix today  9/27- per GI, safe for d/c- can get outpt w/u with endoscopy/colonoscopy. Last colonsocopy had poor prep.  22. Constipation: prune juice with meals.  23. Hypoxia, ?Chest pain 9/25  9/26-Back to baseline after therapy---resolved  -VSS today  -may have been related to dropping hgb  -pt has prn nitro ordered   LOS: 24 days A FACE TO FACE EVALUATION WAS PERFORMED  Aideliz Garmany 02/27/2020, 8:07 PM

## 2020-02-27 NOTE — Consult Note (Addendum)
Referring Provider: No ref. provider found Primary Care Physician:  [facility], [facility] Primary Gastroenterologist:  Dr. Allen Norris  Reason for Consultation:  Anemia, heme + stool   HPI: Jason Montes is a 69 y.o. male with a past medical history of hypertension, coronary artery disease, MI s/p cardiac arrest/pacemaker in 1610, systolic CHF, CVA, atrial fibrillation on Eliquis, DM II, chronic kidney disease stage II, colon polyps. He was admitted to the hospital 01/11/2020 with altered mental status with questionable drug over dose. He as was diagnosed with osteomyelitis s/p left great toe amputation 01/13/2020. He was admitted to [facility] on 02/03/2020. His Hg level has drifted downward throughout his stay in rehab. His anemia was initially assessed to be chronic anemia with a dilutional component.  Admission hemoglobin 10.5 with gradually Hg drop to 8 -> 7. The last 3 days his hemoglobin level has drifted 7.5 -> 7.3 -> 7.7. FOBT +.  No overt signs of GI bleeding.  A GI consult was requested for further evaluation before being discharged to his boarding house.   He denies having any dysphagia.  He has infrequent heartburn.  No upper or lower abdominal pain.  No prior history of GI bleed or ulcers.  He has never had an EGD.  He is on Eliquis due to having atrial fibrillation.  He is on ASA 81mg  daily. He reports of passing black solid stool for the past year.  No bright red rectal bleeding.  He is on Famotidine 20 mg daily and Pantoprazole 40 mg daily.  No alcohol use. He underwent a colonoscopy by Dr. Eliberto Ivory 05/18/2018 and 5 hyperplastic, tubular adenomatous and sessile serrated polyps were removed.  The bowel prep was poor.  He was advised by Dr. Allen Norris to repeat a colonoscopy in 3 years.  His mother had anal or colon cancer, further details are unclear.  Colonoscopy 05/18/2018 by Dr. [name] at [facility]: - Preparation of the colon was poor. - One 6 mm polyp in the sigmoid colon, removed with a cold  snare. Resected and retrieved. - One 6 mm polyp in the descending colon, removed with a cold snare. Resected and retrieved. - One 3 mm polyp in the transverse colon, removed with a cold biopsy forceps. Resected and retrieved. - One 6 mm polyp in the ascending colon, removed with a cold snare. Resected and retrieved. - One 12 mm polyp in the cecum. Biopsied. Family history of anal canal cancer in a first-degree relative  A. COLON POLYP, SIGMOID; COLD SNARE:  - HYPERPLASTIC POLYP.  - NEGATIVE FOR DYSPLASIA AND MALIGNANCY.   B. COLON POLYP, TRANSVERSE; COLD BIOPSY:  - TUBULAR ADENOMA.  - NEGATIVE FOR HIGH-GRADE DYSPLASIA AND MALIGNANCY.   C. COLON POLYP, ASCENDING; COLD SNARE:  - SESSILE SERRATED ADENOMA.  - NEGATIVE FOR HIGH-GRADE DYSPLASIA AND MALIGNANCY.   D. COLON MASS, CECUM; COLD BIOPSY:  - COLONIC MUCOSA WITH FOCAL MELANOSIS COLI AND PATCHY HYPERPLASTIC  EPITHELIAL CHANGE, NON-SPECIFIC.  - SEVERAL DEEPER SECTIONS WERE EXAMINED.  - NEGATIVE FOR DYSPLASIA AND MALIGNANCY.   E. COLON POLYP, DESCENDING; COLD SNARE:  - TUBULAR ADENOMA (MULTIPLE FRAGMENTS).  - SESSILE SERRATED ADENOMA.  - NEGATIVE FOR HIGH-GRADE DYSPLASIA AND MALIGNANCY.   Past Medical History:  Diagnosis Date  . Allergy   . Atrial fibrillation ([place])   . B12 deficiency   . Chronic kidney disease   . Chronic kidney disease (CKD), stage II (mild)   . Coronary atherosclerosis   . Diabetes mellitus without complication ([place])   .  Elevated PSA   . Esophagitis   . GERD (gastroesophageal reflux disease)   . Heart attack ([place])   . Heart disease   . Hyperlipidemia   . Hypertension   . Hypokalemia   . Mild neurocognitive disorder   . Osteoarthritis   . Presbyopia   . PVD (peripheral vascular disease) ([place])   . Restless leg syndrome   . Stroke (cerebrum) ([place])   . Stroke ([place])   . Subacute osteomyelitis ([place])   . Tinea unguium   . Uncompensated short term memory deficit     Past Surgical History:    Procedure Laterality Date  . AMPUTATION TOE Left 01/13/2020   Procedure: AMPUTATION RAY LEFT 1ST;  Surgeon: Caroline More, DPM;  Location: [facility] ORS;  Service: Podiatry;  Laterality: Left;  . CARDIAC PACEMAKER PLACEMENT    . COLONOSCOPY WITH PROPOFOL N/A 05/18/2018   Procedure: COLONOSCOPY WITH PROPOFOL;  Surgeon: [name], MD;  Location: [facility] ENDOSCOPY;  Service: Endoscopy;  Laterality: N/A;  . CORONARY ARTERY BYPASS GRAFT    . FOOT AMPUTATION Right   . INTRAMEDULLARY (IM) NAIL INTERTROCHANTERIC Right 11/16/2019   Procedure: INTRAMEDULLARY (IM) NAIL INTERTROCHANTRIC;  Surgeon: Thornton Park, MD;  Location: [facility] ORS;  Service: Orthopedics;  Laterality: Right;  . LOWER EXTREMITY ANGIOGRAPHY Left 10/10/2019   Procedure: Lower Extremity Angiography;  Surgeon: Algernon Huxley, MD;  Location: [place] [facility];  Service: Cardiovascular;  Laterality: Left;  . LOWER EXTREMITY ANGIOGRAPHY Left 01/16/2020   Procedure: Lower Extremity Angiography;  Surgeon: Algernon Huxley, MD;  Location: [place] [facility];  Service: Cardiovascular;  Laterality: Left;    Prior to Admission medications   Medication Sig Start Date End Date Taking? Authorizing Provider  acetaminophen (TYLENOL) 325 MG tablet Take 2 tablets (650 mg total) by mouth every 6 (six) hours as needed for mild pain (or Fever >/= 101). 02/23/20   Angiulli, Lavon Paganini, PA-C  amiodarone (PACERONE) 400 MG tablet Take 0.5 tablets (200 mg total) by mouth daily. 02/23/20   Angiulli, Lavon Paganini, PA-C  apixaban (ELIQUIS) 5 MG TABS tablet Take 1 tablet (5 mg total) by mouth 2 (two) times daily. 02/23/20   Angiulli, Lavon Paganini, PA-C  ascorbic acid (VITAMIN C) 500 MG tablet Take 1 tablet (500 mg total) by mouth 2 (two) times daily. 02/23/20   Angiulli, Lavon Paganini, PA-C  aspirin EC 81 MG tablet Take 81 mg by mouth daily.    [provider]  atorvastatin (LIPITOR) 40 MG tablet Take 1 tablet (40 mg total) by mouth at bedtime. 02/23/20   Angiulli, Lavon Paganini, PA-C   Cholecalciferol (VITAMIN D3) 25 MCG (1000 UT) CAPS Take 1 capsule (1,000 Units total) by mouth daily. 02/23/20   Angiulli, Lavon Paganini, PA-C  cyanocobalamin (,VITAMIN B-12,) 1000 MCG/ML injection Inject 1,000 mcg into the muscle every 30 (thirty) days. Patient not taking: Reported on 12/23/2019    [provider]  DULoxetine (CYMBALTA) 20 MG capsule Take 1 capsule (20 mg total) by mouth daily. 02/23/20   Angiulli, Lavon Paganini, PA-C  empagliflozin (JARDIANCE) 25 MG TABS tablet Take 12.5 mg by mouth daily.    [provider]  famotidine (PEPCID) 20 MG tablet Take 1 tablet (20 mg total) by mouth daily. 02/23/20   Angiulli, Lavon Paganini, PA-C  feeding supplement, GLUCERNA SHAKE, (GLUCERNA SHAKE) LIQD Take 237 mLs by mouth 2 (two) times daily between meals. Patient not taking: Reported on 12/23/2019 11/24/19   Cristal Ford, DO  fluticasone [facility]) 50 MCG/ACT nasal spray  Place 2 sprays into both nostrils daily.     [provider]  furosemide (LASIX) 20 MG tablet Take 0.5 tablets (10 mg total) by mouth daily. 02/24/20   Angiulli, Lavon Paganini, PA-C  gabapentin (NEURONTIN) 100 MG capsule Take 2 capsules (200 mg total) by mouth 3 (three) times daily. 02/23/20   Angiulli, Lavon Paganini, PA-C  gabapentin (NEURONTIN) 300 MG capsule Take 1 capsule (300 mg total) by mouth 3 (three) times daily. 02/03/20   Enzo Bi, MD  glucose 4 GM chewable tablet Chew 4 tablets by mouth as needed for low blood sugar.    [provider]  haloperidol (HALDOL) 1 MG tablet Take 1 tablet (1 mg total) by mouth 2 (two) times daily. 02/23/20   Angiulli, Lavon Paganini, PA-C  insulin aspart (NOVOLOG) 100 UNIT/ML FlexPen Inject 11 Units into the skin 3 (three) times daily with meals. 02/23/20   Angiulli, Lavon Paganini, PA-C  insulin aspart (NOVOLOG) 100 UNIT/ML injection Inject 6 Units into the skin 3 (three) times daily with meals. 02/03/20   Enzo Bi, MD  insulin glargine (LANTUS) 100 UNIT/ML injection Inject 0.1 mLs (10 Units total)  into the skin daily. 02/04/20   Enzo Bi, MD  insulin glargine (LANTUS) 100 UNIT/ML Solostar Pen Inject 14 Units into the skin daily. 02/23/20   Angiulli, Lavon Paganini, PA-C  lisinopril (ZESTRIL) 2.5 MG tablet Take 2.5 mg by mouth daily.    [provider]  metFORMIN (GLUCOPHAGE) 500 MG tablet Start 1 tab by mouth in am daily for 7 days. Then increase to 1 tab in am & pm for 7 days.  Continue weekly increase to 2 tabs twice daily. Patient taking differently: Take 500 mg by mouth 2 (two) times daily.  02/24/18   Mikey College, NP  mirtazapine (REMERON) 15 MG tablet Take 15 mg by mouth at bedtime.    [provider]  Multiple Vitamin (MULTIVITAMIN WITH MINERALS) TABS tablet Take 1 tablet by mouth daily. 02/24/20   Angiulli, Lavon Paganini, PA-C  nitroGLYCERIN (NITROSTAT) 0.4 MG SL tablet Place 1 tablet (0.4 mg total) under the tongue every 5 (five) minutes as needed for chest pain. 02/23/20   Angiulli, Lavon Paganini, PA-C  olanzapine zydis (ZYPREXA) 15 MG disintegrating tablet Take 1 tablet (15 mg total) by mouth at bedtime. 02/03/20   Enzo Bi, MD  OLANZapine zydis (ZYPREXA) 15 MG disintegrating tablet Take 1 tablet (15 mg total) by mouth at bedtime. 02/23/20   Angiulli, Lavon Paganini, PA-C  pantoprazole (PROTONIX) 40 MG tablet Take 1 tablet (40 mg total) by mouth daily. 02/27/20   Angiulli, Lavon Paganini, PA-C  polyethylene glycol (MIRALAX / GLYCOLAX) 17 g packet Take 17 g by mouth 2 (two) times daily. 02/03/20   Enzo Bi, MD  Semaglutide, 1 MG/DOSE, 2 MG/1.5ML SOPN Inject 1 mg into the skin once a week.     [provider]  senna (SENOKOT) 8.6 MG TABS tablet Take 2 tablets by mouth in the morning and at bedtime.    [provider]  tamsulosin (FLOMAX) 0.4 MG CAPS capsule Take 0.4 mg by mouth daily.     [provider]  tamsulosin (FLOMAX) 0.4 MG CAPS capsule Take 2 capsules (0.8 mg total) by mouth daily. 02/24/20   Angiulli, Lavon Paganini, PA-C  thiamine 100 MG tablet Take 1 tablet (100  mg total) by mouth daily. 01/27/20   Sharen Hones, MD  torsemide (DEMADEX) 20 MG tablet Take 20 mg by mouth daily.    [provider]  vitamin B-12 1000 MCG tablet Take 1 tablet (1,000 mcg total) by mouth daily. 02/24/20   Angiulli, Lavon Paganini, PA-C  zinc sulfate 220 (50 Zn) MG capsule Take 1 capsule (220 mg total) by mouth daily. 02/24/20   Angiulli, Lavon Paganini, PA-C    Current Facility-Administered Medications  Medication Dose Route Frequency Provider Last Rate Last Admin  . (feeding supplement) PROSource Plus liquid 30 mL  30 mL Oral BID BM Love, Pamela S, PA-C   30 mL at 02/26/20 1307  . acetaminophen (TYLENOL) tablet 650 mg  650 mg Oral Q6H PRN Cathlyn Parsons, PA-C   650 mg at 02/25/20 9379   Or  . acetaminophen (TYLENOL) suppository 650 mg  650 mg Rectal Q6H PRN Angiulli, Lavon Paganini, PA-C      . amiodarone (PACERONE) tablet 400 mg  400 mg Oral Daily Cathlyn Parsons, PA-C   400 mg at 02/27/20 0840  . apixaban (ELIQUIS) tablet 5 mg  5 mg Oral BID AngiulliLavon Paganini, PA-C   5 mg at 02/27/20 0840  . ascorbic acid (VITAMIN C) tablet 500 mg  500 mg Oral BID Cathlyn Parsons, PA-C   500 mg at 02/27/20 0840  . aspirin EC tablet 81 mg  81 mg Oral Daily Cathlyn Parsons, PA-C   81 mg at 02/27/20 0840  . atorvastatin (LIPITOR) tablet 40 mg  40 mg Oral QHS Cathlyn Parsons, PA-C   40 mg at 02/26/20 2100  . cholecalciferol (VITAMIN D3) tablet 1,000 Units  1,000 Units Oral Daily Cathlyn Parsons, PA-C   1,000 Units at 02/27/20 0240  . DULoxetine (CYMBALTA) DR capsule 20 mg  20 mg Oral Daily Cathlyn Parsons, PA-C   20 mg at 02/27/20 0839  . famotidine (PEPCID) tablet 20 mg  20 mg Oral Daily Cathlyn Parsons, PA-C   20 mg at 02/27/20 9735  . feeding supplement (NEPRO CARB STEADY) liquid 237 mL  237 mL Oral BID BM Angiulli, Lavon Paganini, PA-C 0 mL/hr at 02/23/20 2000 237 mL at 02/26/20 1415  . fluticasone (FLONASE) 50 MCG/ACT nasal spray 2 spray  2 spray Each Nare Daily Cathlyn Parsons,  PA-C   2 spray at 02/27/20 [phone]  . furosemide (LASIX) tablet 10 mg  10 mg Oral Daily Jamse Arn, MD   10 mg at 02/27/20 0841  . gabapentin (NEURONTIN) capsule 200 mg  200 mg Oral TID Cathlyn Parsons, PA-C   200 mg at 02/27/20 2426  . haloperidol (HALDOL) tablet 1 mg  1 mg Oral BID Cathlyn Parsons, PA-C   1 mg at 02/27/20 0840  . insulin aspart (novoLOG) injection 0-15 Units  0-15 Units Subcutaneous TID AC & HS Cathlyn Parsons, PA-C   5 Units at 02/26/20 2136  . insulin aspart (novoLOG) injection 11 Units  11 Units Subcutaneous TID WC Jamse Arn, MD   11 Units at 02/27/20 0840  . insulin glargine (LANTUS) injection 14 Units  14 Units Subcutaneous Daily Raulkar, Clide Deutscher, MD   14 Units at 02/27/20 0840  . lidocaine (XYLOCAINE) 2 % jelly 1 application  1 application Urethral PRN Courtney Heys, MD   1 application at 83/41/96 1305  . liver oil-zinc oxide (DESITIN) 40 % ointment   Topical PRN Bary Leriche, PA-C   Given at 02/10/20 1813  . multivitamin with minerals tablet 1 tablet  1 tablet Oral Daily Cathlyn Parsons, PA-C   1 tablet at 02/27/20 2229  .  nitroGLYCERIN (NITROSTAT) SL tablet 0.4 mg  0.4 mg Sublingual Q5 min PRN Angiulli, Lavon Paganini, PA-C      . OLANZapine zydis (ZYPREXA) disintegrating tablet 12.5 mg  12.5 mg Oral QHS AngiulliLavon Paganini, PA-C   12.5 mg at 02/26/20 2100  . pantoprazole (PROTONIX) EC tablet 40 mg  40 mg Oral Daily Meredith Staggers, MD   40 mg at 02/27/20 0839  . polyethylene glycol (MIRALAX / GLYCOLAX) packet 17 g  17 g Oral BID Cathlyn Parsons, PA-C   17 g at 02/26/20 5852  . sorbitol 70 % solution 30 mL  30 mL Oral Daily PRN Lovorn, Megan, MD   30 mL at 02/09/20 1811  . tamsulosin (FLOMAX) capsule 0.8 mg  0.8 mg Oral Daily Lovorn, Megan, MD   0.8 mg at 02/27/20 7782  . vitamin B-12 (CYANOCOBALAMIN) tablet 1,000 mcg  1,000 mcg Oral Daily Cathlyn Parsons, PA-C   1,000 mcg at 02/27/20 0841  . zinc sulfate capsule 220 mg  220 mg Oral Daily Bary Leriche, PA-C   220 mg at 02/27/20 4235    Allergies as of 02/03/2020 - Review Complete 02/03/2020  Allergen Reaction Noted  . Amlodipine Other (See Comments) 01/11/2020  . Norvasc [amlodipine besylate]  02/24/2018  . Penicillins  02/24/2018    Family History  Problem Relation Age of Onset  . Heart failure Mother   . Colon cancer Mother   . Diabetes Mother   . Thyroid disease Sister     Social History   Socioeconomic History  . Marital status: Single    Spouse name: Not on file  . Number of children: Not on file  . Years of education: Not on file  . Highest education level: Not on file  Occupational History  . Not on file  Tobacco Use  . Smoking status: Never Smoker  . Smokeless tobacco: Never Used  Vaping Use  . Vaping Use: Never used  Substance and Sexual Activity  . Alcohol use: Never  . Drug use: Never  . Sexual activity: Not on file  Other Topics Concern  . Not on file  Social History Narrative  . Not on file   Social Determinants of Health   Financial Resource Strain:   . Difficulty of Paying Living Expenses: Not on file  Food Insecurity:   . Worried About Charity fundraiser in the Last Year: Not on file  . Ran Out of Food in the Last Year: Not on file  Transportation Needs:   . Lack of Transportation (Medical): Not on file  . Lack of Transportation (Non-Medical): Not on file  Physical Activity:   . Days of Exercise per Week: Not on file  . Minutes of Exercise per Session: Not on file  Stress:   . Feeling of Stress : Not on file  Social Connections:   . Frequency of Communication with Friends and Family: Not on file  . Frequency of Social Gatherings with Friends and Family: Not on file  . Attends Religious Services: Not on file  . Active Member of Clubs or Organizations: Not on file  . Attends Archivist Meetings: Not on file  . Marital Status: Not on file  Intimate Partner Violence:   . Fear of Current or Ex-Partner: Not on file  .  Emotionally Abused: Not on file  . Physically Abused: Not on file  . Sexually Abused: Not on file    Review of Systems: See HPI, all other  systems reviewed and are negative   Physical Exam: Vital signs in last 24 hours: Temp:  [97.5 F (36.4 C)-98.5 F (36.9 C)] 98.1 F (36.7 C) (09/27 0506) Pulse Rate:  [77-81] 77 (09/27 0506) Resp:  [16-20] 16 (09/27 0506) BP: (111-123)/(61-63) 111/62 (09/27 0506) SpO2:  [92 %-97 %] 92 % (09/27 0506) Weight:  [82.6 kg] 82.6 kg (09/27 0500) Last BM Date: 02/24/20 General:  Alert 69 year old male sitting up in the chair in no acute distress. Head:  Normocephalic and atraumatic. Eyes:  No scleral icterus. Conjunctiva pink. Ears:  Normal auditory acuity. Nose:  No deformity, discharge or lesions. Mouth:  Dentition intact. No ulcers or lesions.  Neck:  Supple. No lymphadenopathy or thyromegaly.  Lungs: Sounds clear throughout. Heart: Rate and rhythm.  Systolic murmur. Abdomen: Soft, nondistended, no mass.  Positive bowel sounds all 4 quadrants. Rectal: No external hemorrhoids.  Golden brown soft stool trace of heme positivity. Musculoskeletal: Past right foot amputation.  Left foot in boot. Pulses:  Normal pulses noted. Extremities:  Without clubbing or edema. Neurologic:  Alert and  oriented x 3.  Skin:  Intact without significant lesions or rashes. Psych:  Alert and cooperative. Normal mood and affect.  Intake/Output from previous day: 09/26 0701 - 09/27 0700 In: -  Out: 400 [Urine:400] Intake/Output this shift: No intake/output data recorded.  Lab Results: Recent Labs    02/25/20 1425 02/26/20 0511 02/27/20 0719  WBC 7.8 9.2 8.0  HGB 7.5* 7.3* 7.7*  HCT 24.3* 24.0* 25.7*  PLT  --  PLATELET CLUMPS NOTED ON SMEAR, COUNT APPEARS ADEQUATE PLATELET CLUMPS NOTED ON SMEAR, UNABLE TO ESTIMATE   BMET No results for input(s): NA, K, CL, CO2, GLUCOSE, BUN, CREATININE, CALCIUM in the last 72 hours. LFT No results for input(s): PROT,  ALBUMIN, AST, ALT, ALKPHOS, BILITOT, BILIDIR, IBILI in the last 72 hours. PT/INR No results for input(s): LABPROT, INR in the last 72 hours. Hepatitis Panel No results for input(s): HEPBSAG, HCVAB, HEPAIGM, HEPBIGM in the last 72 hours.    Studies/Results: No results found.  IMPRESSION/PLAN:  30.  69 year old male with a history of coronary artery disease, s/p cardiac arrest 1165, Systolic CHF and atrial fibrillation on Eliquis with acute on chronic anemia and + FOBT.  Rectal exam today showed soft golden brown stool trace streak of heme positivity.  No melena. He is hemodynamically stable.  -EGD and colonoscopy as an outpatient with his GI Dr. Allen Norris in [facility] -Continue Pantoprazole 40mg  QD  2. History of colon polyps, poor bowel prep per colonoscopy in 2019 -See plan in # [place]  02/27/2020, 10:11 AM     ________________________________________________________________________  Velora Heckler GI MD note:  I personally examined the patient, reviewed the data and agree with the assessment and plan described above.  Drifting Hb on eliquis without overt GI bleeding.  Colonoscopy 2 years ago.  Drifting likely from chronic disease, perhaps dilutional component as well.  Safe for outpatient workup with his primary GI at [place] to continue on his blood thinner.  If he has obvious, overt bleeding then would reconsider sooner endoscopy. Please call or page if that occurs.  Thanks    Owens Loffler, MD [facility] Gastroenterology Pager [phone]

## 2020-02-28 LAB — GLUCOSE, CAPILLARY: Glucose-Capillary: 106 mg/dL — ABNORMAL HIGH (ref 70–99)

## 2020-02-28 NOTE — Progress Notes (Signed)
[facility] PHYSICAL MEDICINE & REHABILITATION PROGRESS NOTE   Subjective/Complaints: Patient seen sitting up in his chair this morning.  He stated slept well overnight.  He states he is ready for discharge.  ROS: Denies CP, SOB, N/V/D  Objective:   No results found. Recent Labs    02/26/20 0511 02/27/20 0719  WBC 9.2 8.0  HGB 7.3* 7.7*  HCT 24.0* 25.7*  PLT PLATELET CLUMPS NOTED ON SMEAR, COUNT APPEARS ADEQUATE PLATELET CLUMPS NOTED ON SMEAR, UNABLE TO ESTIMATE   No results for input(s): NA, K, CL, CO2, GLUCOSE, BUN, CREATININE, CALCIUM in the last 72 hours.  Intake/Output Summary (Last 24 hours) at 02/28/2020 1255 Last data filed at 02/28/2020 0900 Gross per 24 hour  Intake 564 ml  Output 700 ml  Net -136 ml     Physical Exam: Vital Signs Blood pressure 117/69, pulse 75, temperature (!) 97.5 F (36.4 C), temperature source Oral, resp. rate 14, weight 82.7 kg, SpO2 94 %.  Constitutional: No distress . Vital signs reviewed. HENT: Normocephalic.  Atraumatic. Eyes: EOMI. No discharge. Cardiovascular: No JVD.  RRR. Respiratory: Normal effort.  No stridor.  Bilateral clear to auscultation. GI: Non-distended.  BS +. Skin: Left foot with dressing CDI Left heel ulcer not examined today Psych: Normal mood.  Normal behavior. Musc: Left foot with improving edema and tenderness Left hallux amputation Neuro: Alert Motor: 4+/5 throughout, unchanged B/l UE resting tremors, stable  Assessment/Plan: 1. Functional deficits secondary to acute metabolic encephalopathy/drug overdose and hx of multiple CVAs which require 3+ hours per day of interdisciplinary therapy in a comprehensive inpatient rehab setting.  Physiatrist is providing close team supervision and 24 hour management of active medical problems listed below.  Physiatrist and rehab team continue to assess barriers to discharge/monitor patient progress toward functional and medical goals  Care Tool:  Bathing    Body  parts bathed by patient: Right arm, Left arm, Chest, Abdomen, Face   Body parts bathed by helper: Right lower leg, Buttocks Body parts n/a: Front perineal area, Buttocks, Right upper leg, Left upper leg, Right lower leg, Left lower leg   Bathing assist Assist Level: Supervision/Verbal cueing     Upper Body Dressing/Undressing Upper body dressing   What is the patient wearing?: Pull over shirt    Upper body assist Assist Level: Set up assist    Lower Body Dressing/Undressing Lower body dressing      What is the patient wearing?: Underwear/pull up, Pants     Lower body assist Assist for lower body dressing: Contact Guard/Touching assist     Toileting Toileting    Toileting assist Assist for toileting: Minimal Assistance - Patient > 75%     Transfers Chair/bed transfer  Transfers assist     Chair/bed transfer assist level: Supervision/Verbal cueing (RW)     Locomotion Ambulation   Ambulation assist   Ambulation activity did not occur: Safety/medical concerns  Assist level: Supervision/Verbal cueing Assistive device: Walker-rolling Max distance: 116ft   Walk 10 feet activity   Assist  Walk 10 feet activity did not occur: Safety/medical concerns  Assist level: Supervision/Verbal cueing Assistive device: Walker-rolling   Walk 50 feet activity   Assist Walk 50 feet with 2 turns activity did not occur: Safety/medical concerns  Assist level: Supervision/Verbal cueing Assistive device: Walker-rolling    Walk 150 feet activity   Assist Walk 150 feet activity did not occur: Safety/medical concerns  Assist level: Contact Guard/Touching assist Assistive device: Walker-rolling    Walk 10 feet on uneven  surface  activity   Assist Walk 10 feet on uneven surfaces activity did not occur: Safety/medical concerns   Assist level: Contact Guard/Touching assist Assistive device: Aeronautical engineer Will patient use wheelchair at  discharge?: No Type of Wheelchair: Manual    Wheelchair assist level: Supervision/Verbal cueing Max wheelchair distance: 57ft    Wheelchair 50 feet with 2 turns activity    Assist        Assist Level: Supervision/Verbal cueing   Wheelchair 150 feet activity     Assist      Assist Level: Moderate Assistance - Patient 50 - 74%   Blood pressure 117/69, pulse 75, temperature (!) 97.5 F (36.4 C), temperature source Oral, resp. rate 14, weight 82.7 kg, SpO2 94 %.  Medical Problem List and Plan: 1.  Altered mental status secondary to acute metabolic encephalopathy related to possible drug overdose as well as history of multiple CVAs/vascular dementia  DC today  Will see patient for transitional care management in 1-2 weeks post-discharge 2.  Antithrombotics: -DVT/anticoagulation: Eliquis             -antiplatelet therapy: Aspirin 81 mg daily, Plavix 75 mg daily 3. Pain Management: Neurontin 200 mg 3 times daily, Cymbalta 20 mg daily.   Controlled with meds on 9/28 4. Mood: Provide emotional support             -antipsychotic agents: Haldol 1 mg BID, Zyprexa 12.5 mg nightly  He was seen by psychiatry, notes reviewed-continue supportive care. 5. Neuropsych: This patient is not fully capable of making decisions on his own behalf. 6. Skin/Wound Care: Routine skin checks  DCed Prevalon boot, ordered PRAFO with benefit  Discussed offloading the sacral area  Discussed offloading and pressure relief to heal with offloading shoe 7. Fluids/Electrolytes/Nutrition: Routine in and outs with follow-up chemistries 8.  Atrial fibrillation.  Amiodarone 400 mg daily. Vitals:   02/28/20 0450 02/28/20 0545  BP: 120/79 117/69  Pulse: 80 75  Resp: 15 14  Temp: 98 F (36.7 C) (!) 97.5 F (36.4 C)  SpO2: 93% 94%   Controlled on 9/28 9.  Chronic left hallux osteomyelitis.  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 Caroline More.  Weightbearing as tolerated through left heel  only with postoperative shoe  Needs cues to remember WB precautions 10.  Hypertension.  Monitor with increased mobility.   Controlled on 9/28 11.  Diabetes mellitus with peripheral neuropathy.  Hemoglobin A1c 7.9.    NovoLog 6 units 3 times daily with meals, increased to 9 3 times daily on 9/9, increased to 11 3 times daily on 9/14  Diabetic teaching.    CBG (last 3)  Recent Labs    02/27/20 1649 02/27/20 2056 02/28/20 0631  GLUCAP 233* 96 106*    Increased lantus to 18u q AM 9/25  Labile on 9/20, monitor ambulatory setting for potential further adjustments 12.  CKD stage III as well as bouts of hyperkalemia.  Admission creatinine 1.59.  Continue Lokelma 5 mg twice daily.  Creatinine 1.31 on 9/17  Encourage fluids 13.  Hyperlipidemia.  Lipitor 14.  CAD with history of pacemaker.  Continue aspirin 15.  Chronic systolic congestive heart failure.  Latest ejection fraction 40 to 45%.  Monitor for any signs of fluid overload   Filed Weights   02/26/20 0441 02/27/20 0500 02/28/20 0500  Weight: 83.2 kg 82.6 kg 82.7 kg   Trending up on 9/22, Lasix 10 daily started on 9/22  Stable on 9/28 16.  Urinary retention.    Increased Flomax to 0.8 mg daily.  17. Orthostatic hypotension: abdominal binder and TEDs ordered  Improving 18.  Thrombocytopenia  Platelets 64 on 9/26 repeat with clumps again  19.  Chronic hyponatremia  Sodium 131 on 9/17  Monitor closely with addition of diuretic 20. UTI  resolved  Completed course of Keflex 21. Acute blood loss anemia on anemia of chronic disease  Hemoglobin 7.7 on 9/27  -CT abdomen from 04/1999 without GI abnl  -saw [facility] 4/20 for lipoma in cecum, no hx of GIB  -he's on pepcid, added protonix   Per GI, safe for d/c- can get outpt w/u with endoscopy/colonoscopy. Last colonsocopy had poor prep.  22. Constipation: prune juice with meals.   > 30 minutes spent in total in discharge planning between myself and PA regarding aforementioned, as  well discussion regarding DME equipment, follow-up appointments, follow-up therapies, discharge medications, discharge recommendations  LOS: 25 days A FACE TO FACE EVALUATION WAS PERFORMED  Tyashia Morrisette Lorie Phenix 02/28/2020, 12:55 PM

## 2020-02-28 NOTE — Progress Notes (Signed)
Patient discharged via ambulance, all belongings sent with patient. No complications noted at this time.  Audie Clear, LPN

## 2020-02-29 ENCOUNTER — Other Ambulatory Visit (INDEPENDENT_AMBULATORY_CARE_PROVIDER_SITE_OTHER): Payer: Self-pay | Admitting: Vascular Surgery

## 2020-02-29 DIAGNOSIS — Z9862 Peripheral vascular angioplasty status: Secondary | ICD-10-CM

## 2020-02-29 DIAGNOSIS — I70249 Atherosclerosis of native arteries of left leg with ulceration of unspecified site: Secondary | ICD-10-CM

## 2020-03-01 ENCOUNTER — Telehealth: Payer: Self-pay

## 2020-03-01 NOTE — Telephone Encounter (Signed)
Transitional Care call--[name] son in law- He could not tell me much because he hasn't seen patient, will see him Saturday. I asked if patient had a number I could reach him at. [name] said no not until Saturday because patient lost phone at the hospital.    1. Are you/is patient experiencing any problems since coming home? No Are there any questions regarding any aspect of care? No 2. Are there any questions regarding medications administration/dosing? No Are meds being taken as prescribed? Yes Patient should review meds with caller to confirm 3. Have there been any falls? Not sure 4. Has Home Health been to the house and/or have they contacted you? Yes If not, have you tried to contact them? Can we help you contact them? 5. Are bowels and bladder emptying properly? Not sure Are there any unexpected incontinence issues? Not sure If applicable, is patient following bowel/bladder programs? 6. Any fevers, problems with breathing, unexpected pain? Not sure 7. Are there any skin problems or new areas of breakdown? Not sure 8. Has the patient/family member arranged specialty MD follow up (ie cardiology/neurology/renal/surgical/etc)? Son in law stated "we are not going to worry about how he gets to his appointments, he has to figure that out" Can we help arrange? 9. Does the patient need any other services or support that we can help arrange? Not sure 10. Are caregivers following through as expected in assisting the patient? No 11. Has the patient quit smoking, drinking alcohol, or using drugs as recommended? Not sure  Appointment time 10:40 am, arrive time 10:20 am with Zella Ball then back to see Dr. Posey Pronto [address]

## 2020-03-06 ENCOUNTER — Encounter (INDEPENDENT_AMBULATORY_CARE_PROVIDER_SITE_OTHER): Payer: Self-pay | Admitting: Nurse Practitioner

## 2020-03-06 ENCOUNTER — Telehealth: Payer: Self-pay | Admitting: *Deleted

## 2020-03-06 ENCOUNTER — Other Ambulatory Visit: Payer: Self-pay

## 2020-03-06 ENCOUNTER — Ambulatory Visit (INDEPENDENT_AMBULATORY_CARE_PROVIDER_SITE_OTHER): Payer: Medicare Other | Admitting: Nurse Practitioner

## 2020-03-06 ENCOUNTER — Ambulatory Visit (INDEPENDENT_AMBULATORY_CARE_PROVIDER_SITE_OTHER): Payer: Medicare Other

## 2020-03-06 VITALS — BP 126/74 | HR 84 | Resp 16 | Wt 175.0 lb

## 2020-03-06 DIAGNOSIS — Z9862 Peripheral vascular angioplasty status: Secondary | ICD-10-CM

## 2020-03-06 DIAGNOSIS — I70249 Atherosclerosis of native arteries of left leg with ulceration of unspecified site: Secondary | ICD-10-CM | POA: Diagnosis not present

## 2020-03-06 DIAGNOSIS — I10 Essential (primary) hypertension: Secondary | ICD-10-CM | POA: Diagnosis not present

## 2020-03-06 DIAGNOSIS — M869 Osteomyelitis, unspecified: Secondary | ICD-10-CM

## 2020-03-06 NOTE — Telephone Encounter (Signed)
I am aware of both and both were healing in the hospital. If this continues to be an issue or regresses, then we can make a referral to wound care.  Thanks.

## 2020-03-06 NOTE — Progress Notes (Signed)
Subjective:    Patient ID: Jason Montes, male    DOB: 04/11/1951, 69 y.o.   MRN: [id number] Chief Complaint  Patient presents with  . Follow-up    [facility] 69month post le angio     The patient returns to the office for followup and review status post angiogram with intervention. The patient notes improvement in the lower extremity symptoms. No interval shortening of the patient's claudication distance or rest pain symptoms.  The current left great toe amputation site is becoming smaller in size although it is not completely healed.  There is a new ulceration on his heel that occurred during his time in the hospital.  There is a good deal of slough covering the amputation site.  No evidence of infection.  There have been no significant changes to the patient's overall health care.  The patient denies amaurosis fugax or recent TIA symptoms. There are no recent neurological changes noted. The patient denies history of DVT, PE or superficial thrombophlebitis. The patient denies recent episodes of angina or shortness of breath.   ABI's are noncompressible bilaterally.  (No previous ABIs) Duplex US of the bilateral tibial arteries reveals strong triphasic waveforms.   Review of Systems  Skin: Positive for wound.  Neurological: Positive for weakness.  All other systems reviewed and are negative.      Objective:   Physical Exam Vitals reviewed.  HENT:     Head: Normocephalic.  Cardiovascular:     Rate and Rhythm: Normal rate and regular rhythm.     Pulses: Normal pulses.  Pulmonary:     Effort: Pulmonary effort is normal.  Musculoskeletal:       Feet:  Skin:    General: Skin is warm.     Findings: Rash (LLE) present.  Neurological:     Mental Status: He is alert and oriented to person, place, and time. Mental status is at baseline.     Motor: Weakness present.     Gait: Gait abnormal.  Psychiatric:        Mood and Affect: Mood normal.        Behavior: Behavior normal.         Thought Content: Thought content normal.        Judgment: Judgment normal.     BP 126/74 (BP Location: Left Arm)   Pulse 84   Resp 16   Wt 175 lb (79.4 kg)   BMI 25.84 kg/m   Past Medical History:  Diagnosis Date  . Allergy   . Atrial fibrillation ([place])   . B12 deficiency   . Chronic kidney disease   . Chronic kidney disease (CKD), stage II (mild)   . Coronary atherosclerosis   . Diabetes mellitus without complication ([place])   . Elevated PSA   . Esophagitis   . GERD (gastroesophageal reflux disease)   . Heart attack ([place])   . Heart disease   . Hyperlipidemia   . Hypertension   . Hypokalemia   . Mild neurocognitive disorder   . Osteoarthritis   . Presbyopia   . PVD (peripheral vascular disease) ([place])   . Restless leg syndrome   . Stroke (cerebrum) ([place])   . Stroke ([place])   . Subacute osteomyelitis ([place])   . Tinea unguium   . Uncompensated short term memory deficit     Social History   Socioeconomic History  . Marital status: Single    Spouse name: Not on file  . Number of children: Not on file  .  Years of education: Not on file  . Highest education level: Not on file  Occupational History  . Not on file  Tobacco Use  . Smoking status: Never Smoker  . Smokeless tobacco: Never Used  Vaping Use  . Vaping Use: Never used  Substance and Sexual Activity  . Alcohol use: Never  . Drug use: Never  . Sexual activity: Not on file  Other Topics Concern  . Not on file  Social History Narrative  . Not on file   Social Determinants of Health   Financial Resource Strain:   . Difficulty of Paying Living Expenses: Not on file  Food Insecurity:   . Worried About Charity fundraiser in the Last Year: Not on file  . Ran Out of Food in the Last Year: Not on file  Transportation Needs:   . Lack of Transportation (Medical): Not on file  . Lack of Transportation (Non-Medical): Not on file  Physical Activity:   . Days of Exercise per Week: Not on file  . Minutes  of Exercise per Session: Not on file  Stress:   . Feeling of Stress : Not on file  Social Connections:   . Frequency of Communication with Friends and Family: Not on file  . Frequency of Social Gatherings with Friends and Family: Not on file  . Attends Religious Services: Not on file  . Active Member of Clubs or Organizations: Not on file  . Attends Archivist Meetings: Not on file  . Marital Status: Not on file  Intimate Partner Violence:   . Fear of Current or Ex-Partner: Not on file  . Emotionally Abused: Not on file  . Physically Abused: Not on file  . Sexually Abused: Not on file    Past Surgical History:  Procedure Laterality Date  . AMPUTATION TOE Left 01/13/2020   Procedure: AMPUTATION RAY LEFT 1ST;  Surgeon: Caroline More, DPM;  Location: [facility] ORS;  Service: Podiatry;  Laterality: Left;  . CARDIAC PACEMAKER PLACEMENT    . COLONOSCOPY WITH PROPOFOL N/A 05/18/2018   Procedure: COLONOSCOPY WITH PROPOFOL;  Surgeon: [name], MD;  Location: [facility] ENDOSCOPY;  Service: Endoscopy;  Laterality: N/A;  . CORONARY ARTERY BYPASS GRAFT    . FOOT AMPUTATION Right   . INTRAMEDULLARY (IM) NAIL INTERTROCHANTERIC Right 11/16/2019   Procedure: INTRAMEDULLARY (IM) NAIL INTERTROCHANTRIC;  Surgeon: Thornton Park, MD;  Location: [facility] ORS;  Service: Orthopedics;  Laterality: Right;  . LOWER EXTREMITY ANGIOGRAPHY Left 10/10/2019   Procedure: Lower Extremity Angiography;  Surgeon: Algernon Huxley, MD;  Location: [facility];  Service: Cardiovascular;  Laterality: Left;  . LOWER EXTREMITY ANGIOGRAPHY Left 01/16/2020   Procedure: Lower Extremity Angiography;  Surgeon: Algernon Huxley, MD;  Location: [facility];  Service: Cardiovascular;  Laterality: Left;    Family History  Problem Relation Age of Onset  . Heart failure Mother   . Colon cancer Mother   . Diabetes Mother   . Thyroid disease Sister     Allergies  Allergen Reactions  . Amlodipine Other (See Comments)  .  Penicillins     Childhood allergy, not sure what happens  Other reaction(s): Other, Other  . Norvasc [Amlodipine Besylate]     Unknown       Assessment & Plan:   1. Benign essential HTN Continue antihypertensive medications as already ordered, these medications have been reviewed and there are no changes at this time.   2. 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  The patient does not voice lifestyle limiting changes at this point in time.  Noninvasive studies do not suggest clinically significant change.  No invasive studies, angiography or surgery at this time The patient should continue walking and begin a more formal exercise program.  The patient should continue antiplatelet therapy and aggressive treatment of the lipid abnormalities  No changes in the patient's medications at this time  The patient should continue wearing graduated compression socks 10-15 mmHg strength to control the mild edema.    3. Osteomyelitis of great toe of left foot [facility]) Patient will continue with wound care as administered by home health.  Patient will also continue with podiatry for wound management.  We will maintain close follow-up of the patient to ensure that his wound is beginning to progress.  Therefore we will have him return in 6 weeks for noninvasive studies and also to check the progress of the patient's wound healing.    Current Outpatient Medications on File Prior to Visit  Medication Sig Dispense Refill  . acetaminophen (TYLENOL) 325 MG tablet Take 2 tablets (650 mg total) by mouth every 6 (six) hours as needed for mild pain (or Fever >/= 101).    Marland Kitchen amiodarone (PACERONE) 400 MG tablet Take 0.5 tablets (200 mg total) by mouth daily. 30 tablet 0  . apixaban (ELIQUIS) 5 MG TABS tablet Take 1 tablet (5 mg total) by mouth  2 (two) times daily. 60 tablet 0  . ascorbic acid (VITAMIN C) 500 MG tablet Take 1 tablet (500 mg total) by mouth 2 (two) times daily. 60 tablet 0  . aspirin EC 81 MG tablet Take 81 mg by mouth daily.    Marland Kitchen atorvastatin (LIPITOR) 40 MG tablet Take 1 tablet (40 mg total) by mouth at bedtime. 30 tablet 0  . Cholecalciferol (VITAMIN D3) 25 MCG (1000 UT) CAPS Take 1 capsule (1,000 Units total) by mouth daily. 60 capsule 0  . DULoxetine (CYMBALTA) 20 MG capsule Take 1 capsule (20 mg total) by mouth daily. 30 capsule 0  . famotidine (PEPCID) 20 MG tablet Take 1 tablet (20 mg total) by mouth daily. 30 tablet 0  . furosemide (LASIX) 20 MG tablet Take 0.5 tablets (10 mg total) by mouth daily. 30 tablet 0  . gabapentin (NEURONTIN) 100 MG capsule Take 2 capsules (200 mg total) by mouth 3 (three) times daily. 90 capsule 0  . haloperidol (HALDOL) 1 MG tablet Take 1 tablet (1 mg total) by mouth 2 (two) times daily. 14 tablet 0  . insulin aspart (NOVOLOG) 100 UNIT/ML FlexPen Inject 11 Units into the skin 3 (three) times daily with meals. 15 mL 11  . insulin glargine (LANTUS) 100 UNIT/ML Solostar Pen Inject 14 Units into the skin daily. 15 mL 11  . Multiple Vitamin (MULTIVITAMIN WITH MINERALS) TABS tablet Take 1 tablet by mouth daily.    . nitroGLYCERIN (NITROSTAT) 0.4 MG SL tablet Place 1 tablet (0.4 mg total) under the tongue every 5 (five) minutes as needed for chest pain. 20 tablet 12  . OLANZapine zydis (ZYPREXA) 15 MG disintegrating tablet Take 1 tablet (15 mg total) by mouth at bedtime. 14 tablet 0  . pantoprazole (PROTONIX) 40 MG tablet Take 1 tablet (40 mg total) by mouth daily. 30 tablet 0  . polyethylene glycol (MIRALAX / GLYCOLAX) 17 g packet Take 17 g by mouth 2 (two) times  daily. 14 each 0  . senna (SENOKOT) 8.6 MG TABS tablet Take 2 tablets by mouth in the morning and at bedtime.    . tamsulosin (FLOMAX) 0.4 MG CAPS capsule Take 2 capsules (0.8 mg total) by mouth daily. 30 capsule 0  . vitamin B-12  1000 MCG tablet Take 1 tablet (1,000 mcg total) by mouth daily. 30 tablet 0  . zinc sulfate 220 (50 Zn) MG capsule Take 1 capsule (220 mg total) by mouth daily. 30 capsule 0   No current facility-administered medications on file prior to visit.    There are no Patient Instructions on file for this visit. No follow-ups on file.   Kris Hartmann, NP

## 2020-03-06 NOTE — Telephone Encounter (Signed)
Demetria called from [facility] requesting further directions about a sacral wound.  I called to speak with her but she was unavailable.  By looking through records we have noted heel wound with wound orders, but nothing beyond "discussed offloading sacral area". Who is managing the heel wound and are you aware of a sacral wound?

## 2020-03-07 NOTE — Telephone Encounter (Signed)
They are going to fax over findings when the nurse goes out today. They are already dressing the foot wound so they will need specifics on the sacrum. (for coding/billing purposes)

## 2020-03-08 ENCOUNTER — Telehealth: Payer: Self-pay | Admitting: *Deleted

## 2020-03-08 NOTE — Telephone Encounter (Signed)
Jason Montes PT called to report that [name] was throwing up this morning and wanted  PT visit has been rescheduled to next week.

## 2020-03-08 NOTE — Telephone Encounter (Signed)
If his wound is getting worse, he should visit PCP or urgent care prior to the visit.  Thanks.

## 2020-03-09 ENCOUNTER — Emergency Department: Payer: No Typology Code available for payment source

## 2020-03-09 ENCOUNTER — Inpatient Hospital Stay
Admission: EM | Admit: 2020-03-09 | Discharge: 2020-04-02 | DRG: 853 | Disposition: E | Payer: No Typology Code available for payment source | Attending: Obstetrics and Gynecology | Admitting: Obstetrics and Gynecology

## 2020-03-09 ENCOUNTER — Other Ambulatory Visit: Payer: Self-pay

## 2020-03-09 ENCOUNTER — Inpatient Hospital Stay: Payer: No Typology Code available for payment source

## 2020-03-09 ENCOUNTER — Encounter: Payer: Self-pay | Admitting: Emergency Medicine

## 2020-03-09 DIAGNOSIS — Z7901 Long term (current) use of anticoagulants: Secondary | ICD-10-CM

## 2020-03-09 DIAGNOSIS — I2510 Atherosclerotic heart disease of native coronary artery without angina pectoris: Secondary | ICD-10-CM | POA: Diagnosis present

## 2020-03-09 DIAGNOSIS — E538 Deficiency of other specified B group vitamins: Secondary | ICD-10-CM | POA: Diagnosis present

## 2020-03-09 DIAGNOSIS — I480 Paroxysmal atrial fibrillation: Secondary | ICD-10-CM | POA: Diagnosis present

## 2020-03-09 DIAGNOSIS — R7401 Elevation of levels of liver transaminase levels: Secondary | ICD-10-CM

## 2020-03-09 DIAGNOSIS — D696 Thrombocytopenia, unspecified: Secondary | ICD-10-CM | POA: Diagnosis not present

## 2020-03-09 DIAGNOSIS — L089 Local infection of the skin and subcutaneous tissue, unspecified: Secondary | ICD-10-CM | POA: Diagnosis present

## 2020-03-09 DIAGNOSIS — K7200 Acute and subacute hepatic failure without coma: Secondary | ICD-10-CM | POA: Diagnosis not present

## 2020-03-09 DIAGNOSIS — I340 Nonrheumatic mitral (valve) insufficiency: Secondary | ICD-10-CM | POA: Diagnosis not present

## 2020-03-09 DIAGNOSIS — Z880 Allergy status to penicillin: Secondary | ICD-10-CM

## 2020-03-09 DIAGNOSIS — Z515 Encounter for palliative care: Secondary | ICD-10-CM | POA: Diagnosis not present

## 2020-03-09 DIAGNOSIS — E118 Type 2 diabetes mellitus with unspecified complications: Secondary | ICD-10-CM | POA: Diagnosis not present

## 2020-03-09 DIAGNOSIS — E11621 Type 2 diabetes mellitus with foot ulcer: Secondary | ICD-10-CM | POA: Diagnosis present

## 2020-03-09 DIAGNOSIS — E1142 Type 2 diabetes mellitus with diabetic polyneuropathy: Secondary | ICD-10-CM | POA: Diagnosis present

## 2020-03-09 DIAGNOSIS — Z20822 Contact with and (suspected) exposure to covid-19: Secondary | ICD-10-CM | POA: Diagnosis present

## 2020-03-09 DIAGNOSIS — I130 Hypertensive heart and chronic kidney disease with heart failure and stage 1 through stage 4 chronic kidney disease, or unspecified chronic kidney disease: Secondary | ICD-10-CM | POA: Diagnosis present

## 2020-03-09 DIAGNOSIS — Z66 Do not resuscitate: Secondary | ICD-10-CM | POA: Diagnosis present

## 2020-03-09 DIAGNOSIS — E1122 Type 2 diabetes mellitus with diabetic chronic kidney disease: Secondary | ICD-10-CM | POA: Diagnosis present

## 2020-03-09 DIAGNOSIS — M86672 Other chronic osteomyelitis, left ankle and foot: Secondary | ICD-10-CM | POA: Diagnosis present

## 2020-03-09 DIAGNOSIS — I10 Essential (primary) hypertension: Secondary | ICD-10-CM | POA: Diagnosis present

## 2020-03-09 DIAGNOSIS — Z7982 Long term (current) use of aspirin: Secondary | ICD-10-CM

## 2020-03-09 DIAGNOSIS — A419 Sepsis, unspecified organism: Secondary | ICD-10-CM | POA: Diagnosis present

## 2020-03-09 DIAGNOSIS — R9431 Abnormal electrocardiogram [ECG] [EKG]: Secondary | ICD-10-CM | POA: Diagnosis not present

## 2020-03-09 DIAGNOSIS — B9561 Methicillin susceptible Staphylococcus aureus infection as the cause of diseases classified elsewhere: Secondary | ICD-10-CM | POA: Diagnosis not present

## 2020-03-09 DIAGNOSIS — S98319A Complete traumatic amputation of unspecified midfoot, initial encounter: Secondary | ICD-10-CM | POA: Diagnosis present

## 2020-03-09 DIAGNOSIS — E11628 Type 2 diabetes mellitus with other skin complications: Secondary | ICD-10-CM | POA: Diagnosis present

## 2020-03-09 DIAGNOSIS — N1831 Chronic kidney disease, stage 3a: Secondary | ICD-10-CM | POA: Diagnosis present

## 2020-03-09 DIAGNOSIS — D649 Anemia, unspecified: Secondary | ICD-10-CM

## 2020-03-09 DIAGNOSIS — A4102 Sepsis due to Methicillin resistant Staphylococcus aureus: Principal | ICD-10-CM | POA: Diagnosis present

## 2020-03-09 DIAGNOSIS — Z951 Presence of aortocoronary bypass graft: Secondary | ICD-10-CM

## 2020-03-09 DIAGNOSIS — E1151 Type 2 diabetes mellitus with diabetic peripheral angiopathy without gangrene: Secondary | ICD-10-CM | POA: Diagnosis present

## 2020-03-09 DIAGNOSIS — IMO0002 Reserved for concepts with insufficient information to code with codable children: Secondary | ICD-10-CM | POA: Diagnosis present

## 2020-03-09 DIAGNOSIS — R6521 Severe sepsis with septic shock: Secondary | ICD-10-CM | POA: Diagnosis present

## 2020-03-09 DIAGNOSIS — R0682 Tachypnea, not elsewhere classified: Secondary | ICD-10-CM | POA: Diagnosis not present

## 2020-03-09 DIAGNOSIS — R410 Disorientation, unspecified: Secondary | ICD-10-CM | POA: Diagnosis not present

## 2020-03-09 DIAGNOSIS — Z86010 Personal history of colonic polyps: Secondary | ICD-10-CM

## 2020-03-09 DIAGNOSIS — Z09 Encounter for follow-up examination after completed treatment for conditions other than malignant neoplasm: Secondary | ICD-10-CM

## 2020-03-09 DIAGNOSIS — M86272 Subacute osteomyelitis, left ankle and foot: Secondary | ICD-10-CM | POA: Diagnosis present

## 2020-03-09 DIAGNOSIS — I7025 Atherosclerosis of native arteries of other extremities with ulceration: Secondary | ICD-10-CM | POA: Diagnosis not present

## 2020-03-09 DIAGNOSIS — Z8674 Personal history of sudden cardiac arrest: Secondary | ICD-10-CM

## 2020-03-09 DIAGNOSIS — D6959 Other secondary thrombocytopenia: Secondary | ICD-10-CM | POA: Diagnosis present

## 2020-03-09 DIAGNOSIS — R6520 Severe sepsis without septic shock: Secondary | ICD-10-CM | POA: Diagnosis present

## 2020-03-09 DIAGNOSIS — M8660 Other chronic osteomyelitis, unspecified site: Secondary | ICD-10-CM | POA: Diagnosis not present

## 2020-03-09 DIAGNOSIS — E8809 Other disorders of plasma-protein metabolism, not elsewhere classified: Secondary | ICD-10-CM | POA: Diagnosis not present

## 2020-03-09 DIAGNOSIS — R4182 Altered mental status, unspecified: Secondary | ICD-10-CM | POA: Diagnosis not present

## 2020-03-09 DIAGNOSIS — I5022 Chronic systolic (congestive) heart failure: Secondary | ICD-10-CM | POA: Diagnosis present

## 2020-03-09 DIAGNOSIS — Z8249 Family history of ischemic heart disease and other diseases of the circulatory system: Secondary | ICD-10-CM

## 2020-03-09 DIAGNOSIS — L98494 Non-pressure chronic ulcer of skin of other sites with necrosis of bone: Secondary | ICD-10-CM | POA: Diagnosis present

## 2020-03-09 DIAGNOSIS — Z89421 Acquired absence of other right toe(s): Secondary | ICD-10-CM

## 2020-03-09 DIAGNOSIS — G9341 Metabolic encephalopathy: Secondary | ICD-10-CM | POA: Diagnosis present

## 2020-03-09 DIAGNOSIS — Z888 Allergy status to other drugs, medicaments and biological substances status: Secondary | ICD-10-CM

## 2020-03-09 DIAGNOSIS — H524 Presbyopia: Secondary | ICD-10-CM | POA: Diagnosis present

## 2020-03-09 DIAGNOSIS — L97529 Non-pressure chronic ulcer of other part of left foot with unspecified severity: Secondary | ICD-10-CM | POA: Diagnosis not present

## 2020-03-09 DIAGNOSIS — E7220 Disorder of urea cycle metabolism, unspecified: Secondary | ICD-10-CM

## 2020-03-09 DIAGNOSIS — R7881 Bacteremia: Secondary | ICD-10-CM | POA: Diagnosis not present

## 2020-03-09 DIAGNOSIS — S91102A Unspecified open wound of left great toe without damage to nail, initial encounter: Secondary | ICD-10-CM | POA: Diagnosis present

## 2020-03-09 DIAGNOSIS — Z8349 Family history of other endocrine, nutritional and metabolic diseases: Secondary | ICD-10-CM

## 2020-03-09 DIAGNOSIS — I248 Other forms of acute ischemic heart disease: Secondary | ICD-10-CM | POA: Diagnosis present

## 2020-03-09 DIAGNOSIS — E1165 Type 2 diabetes mellitus with hyperglycemia: Secondary | ICD-10-CM

## 2020-03-09 DIAGNOSIS — I4891 Unspecified atrial fibrillation: Secondary | ICD-10-CM | POA: Diagnosis present

## 2020-03-09 DIAGNOSIS — T8130XA Disruption of wound, unspecified, initial encounter: Secondary | ICD-10-CM | POA: Diagnosis present

## 2020-03-09 DIAGNOSIS — I252 Old myocardial infarction: Secondary | ICD-10-CM

## 2020-03-09 DIAGNOSIS — Z794 Long term (current) use of insulin: Secondary | ICD-10-CM

## 2020-03-09 DIAGNOSIS — N179 Acute kidney failure, unspecified: Secondary | ICD-10-CM | POA: Diagnosis present

## 2020-03-09 DIAGNOSIS — J690 Pneumonitis due to inhalation of food and vomit: Secondary | ICD-10-CM | POA: Diagnosis present

## 2020-03-09 DIAGNOSIS — Z833 Family history of diabetes mellitus: Secondary | ICD-10-CM

## 2020-03-09 DIAGNOSIS — R1110 Vomiting, unspecified: Secondary | ICD-10-CM | POA: Diagnosis present

## 2020-03-09 DIAGNOSIS — Z8673 Personal history of transient ischemic attack (TIA), and cerebral infarction without residual deficits: Secondary | ICD-10-CM

## 2020-03-09 DIAGNOSIS — Z7189 Other specified counseling: Secondary | ICD-10-CM | POA: Diagnosis not present

## 2020-03-09 DIAGNOSIS — R45851 Suicidal ideations: Secondary | ICD-10-CM | POA: Diagnosis present

## 2020-03-09 DIAGNOSIS — Z800 Family history of malignant neoplasm of digestive organs: Secondary | ICD-10-CM

## 2020-03-09 DIAGNOSIS — R000 Tachycardia, unspecified: Secondary | ICD-10-CM | POA: Diagnosis present

## 2020-03-09 DIAGNOSIS — R7989 Other specified abnormal findings of blood chemistry: Secondary | ICD-10-CM | POA: Diagnosis present

## 2020-03-09 DIAGNOSIS — D65 Disseminated intravascular coagulation [defibrination syndrome]: Secondary | ICD-10-CM | POA: Diagnosis not present

## 2020-03-09 DIAGNOSIS — F09 Unspecified mental disorder due to known physiological condition: Secondary | ICD-10-CM | POA: Diagnosis not present

## 2020-03-09 DIAGNOSIS — E785 Hyperlipidemia, unspecified: Secondary | ICD-10-CM | POA: Diagnosis present

## 2020-03-09 DIAGNOSIS — Z950 Presence of cardiac pacemaker: Secondary | ICD-10-CM | POA: Diagnosis not present

## 2020-03-09 DIAGNOSIS — D631 Anemia in chronic kidney disease: Secondary | ICD-10-CM | POA: Diagnosis present

## 2020-03-09 DIAGNOSIS — G9389 Other specified disorders of brain: Secondary | ICD-10-CM | POA: Diagnosis present

## 2020-03-09 DIAGNOSIS — R778 Other specified abnormalities of plasma proteins: Secondary | ICD-10-CM

## 2020-03-09 DIAGNOSIS — L97429 Non-pressure chronic ulcer of left heel and midfoot with unspecified severity: Secondary | ICD-10-CM | POA: Diagnosis not present

## 2020-03-09 DIAGNOSIS — L89629 Pressure ulcer of left heel, unspecified stage: Secondary | ICD-10-CM | POA: Diagnosis present

## 2020-03-09 DIAGNOSIS — M1990 Unspecified osteoarthritis, unspecified site: Secondary | ICD-10-CM | POA: Diagnosis present

## 2020-03-09 DIAGNOSIS — S91302A Unspecified open wound, left foot, initial encounter: Secondary | ICD-10-CM | POA: Diagnosis not present

## 2020-03-09 DIAGNOSIS — R945 Abnormal results of liver function studies: Secondary | ICD-10-CM

## 2020-03-09 DIAGNOSIS — K2100 Gastro-esophageal reflux disease with esophagitis, without bleeding: Secondary | ICD-10-CM | POA: Diagnosis present

## 2020-03-09 DIAGNOSIS — G2581 Restless legs syndrome: Secondary | ICD-10-CM | POA: Diagnosis present

## 2020-03-09 DIAGNOSIS — Z79899 Other long term (current) drug therapy: Secondary | ICD-10-CM

## 2020-03-09 DIAGNOSIS — E1169 Type 2 diabetes mellitus with other specified complication: Secondary | ICD-10-CM | POA: Diagnosis present

## 2020-03-09 DIAGNOSIS — R601 Generalized edema: Secondary | ICD-10-CM | POA: Diagnosis not present

## 2020-03-09 DIAGNOSIS — I6930 Unspecified sequelae of cerebral infarction: Secondary | ICD-10-CM

## 2020-03-09 LAB — CBC
HCT: 27.7 % — ABNORMAL LOW (ref 39.0–52.0)
Hemoglobin: 8.3 g/dL — ABNORMAL LOW (ref 13.0–17.0)
MCH: 25.6 pg — ABNORMAL LOW (ref 26.0–34.0)
MCHC: 30 g/dL (ref 30.0–36.0)
MCV: 85.5 fL (ref 80.0–100.0)
Platelets: 284 10*3/uL (ref 150–400)
RBC: 3.24 MIL/uL — ABNORMAL LOW (ref 4.22–5.81)
RDW: 17.8 % — ABNORMAL HIGH (ref 11.5–15.5)
WBC: 21.7 10*3/uL — ABNORMAL HIGH (ref 4.0–10.5)
nRBC: 0 % (ref 0.0–0.2)

## 2020-03-09 LAB — COMPREHENSIVE METABOLIC PANEL
ALT: 48 U/L — ABNORMAL HIGH (ref 0–44)
AST: 46 U/L — ABNORMAL HIGH (ref 15–41)
Albumin: 3 g/dL — ABNORMAL LOW (ref 3.5–5.0)
Alkaline Phosphatase: 108 U/L (ref 38–126)
Anion gap: 20 — ABNORMAL HIGH (ref 5–15)
BUN: 23 mg/dL (ref 8–23)
CO2: 16 mmol/L — ABNORMAL LOW (ref 22–32)
Calcium: 9 mg/dL (ref 8.9–10.3)
Chloride: 98 mmol/L (ref 98–111)
Creatinine, Ser: 1.86 mg/dL — ABNORMAL HIGH (ref 0.61–1.24)
GFR, Estimated: 37 mL/min — ABNORMAL LOW (ref >60)
Glucose, Bld: 170 mg/dL — ABNORMAL HIGH (ref 70–99)
Potassium: 4.5 mmol/L (ref 3.5–5.1)
Sodium: 134 mmol/L — ABNORMAL LOW (ref 135–145)
Total Bilirubin: 1.4 mg/dL — ABNORMAL HIGH (ref 0.3–1.2)
Total Protein: 9.6 g/dL — ABNORMAL HIGH (ref 6.5–8.1)

## 2020-03-09 LAB — URINE DRUG SCREEN, QUALITATIVE (ARMC ONLY)
Amphetamines, Ur Screen: NOT DETECTED
Barbiturates, Ur Screen: NOT DETECTED
Benzodiazepine, Ur Scrn: NOT DETECTED
Cannabinoid 50 Ng, Ur ~~LOC~~: NOT DETECTED
Cocaine Metabolite,Ur ~~LOC~~: NOT DETECTED
MDMA (Ecstasy)Ur Screen: NOT DETECTED
Methadone Scn, Ur: NOT DETECTED
Opiate, Ur Screen: NOT DETECTED
Phencyclidine (PCP) Ur S: NOT DETECTED
Tricyclic, Ur Screen: NOT DETECTED

## 2020-03-09 LAB — URINALYSIS, COMPLETE (UACMP) WITH MICROSCOPIC
Bilirubin Urine: NEGATIVE
Glucose, UA: >=500 mg/dL — AB
Hgb urine dipstick: NEGATIVE
Ketones, ur: 5 mg/dL — AB
Leukocytes,Ua: NEGATIVE
Nitrite: NEGATIVE
Protein, ur: 30 mg/dL — AB
Specific Gravity, Urine: 1.022 (ref 1.005–1.030)
pH: 5 (ref 5.0–8.0)

## 2020-03-09 LAB — LACTIC ACID, PLASMA
Lactic Acid, Venous: 11 mmol/L (ref 0.5–1.9)
Lactic Acid, Venous: 2.9 mmol/L (ref 0.5–1.9)

## 2020-03-09 LAB — PROTIME-INR
INR: 1.8 — ABNORMAL HIGH (ref 0.8–1.2)
Prothrombin Time: 19.9 seconds — ABNORMAL HIGH (ref 11.4–15.2)

## 2020-03-09 LAB — TROPONIN I (HIGH SENSITIVITY)
Troponin I (High Sensitivity): 96 ng/L — ABNORMAL HIGH (ref <18)
Troponin I (High Sensitivity): 128 ng/L (ref <18)

## 2020-03-09 LAB — AMMONIA: Ammonia: 50 umol/L — ABNORMAL HIGH (ref 9–35)

## 2020-03-09 LAB — RESPIRATORY PANEL BY RT PCR (FLU A&B, COVID)
Influenza A by PCR: NEGATIVE
Influenza B by PCR: NEGATIVE
SARS Coronavirus 2 by RT PCR: NEGATIVE

## 2020-03-09 LAB — ETHANOL: Alcohol, Ethyl (B): <10 mg/dL (ref <10)

## 2020-03-09 LAB — ACETAMINOPHEN LEVEL: Acetaminophen (Tylenol), Serum: <10 ug/mL — ABNORMAL LOW (ref 10–30)

## 2020-03-09 LAB — APTT: aPTT: 31 seconds (ref 24–36)

## 2020-03-09 LAB — GLUCOSE, CAPILLARY: Glucose-Capillary: 172 mg/dL — ABNORMAL HIGH (ref 70–99)

## 2020-03-09 LAB — SALICYLATE LEVEL: Salicylate Lvl: <7 mg/dL — ABNORMAL LOW (ref 7.0–30.0)

## 2020-03-09 MED ORDER — ONDANSETRON HCL 4 MG/2ML IJ SOLN: 4.0000 mg | Freq: Four times a day (QID) | INTRAMUSCULAR | Status: DC | PRN

## 2020-03-09 MED ORDER — SODIUM CHLORIDE 0.9 % IV SOLN
250.0000 mL | INTRAVENOUS | Status: DC
  Administered 2020-03-09 – 2020-03-14 (×2): 250 mL via INTRAVENOUS

## 2020-03-09 MED ORDER — DULOXETINE HCL 20 MG PO CPEP
20.0000 mg | ORAL_CAPSULE | Freq: Every day | ORAL | Status: DC
  Administered 2020-03-13: 20 mg via ORAL
  Filled 2020-03-09 (×5): qty 1

## 2020-03-09 MED ORDER — SODIUM CHLORIDE 0.9 % IV BOLUS
1400.0000 mL | Freq: Once | INTRAVENOUS | Status: AC
  Administered 2020-03-09: 1400 mL via INTRAVENOUS

## 2020-03-09 MED ORDER — SODIUM CHLORIDE 0.9 % IV SOLN
INTRAVENOUS | Status: DC
  Administered 2020-03-09: 1 mL via INTRAVENOUS

## 2020-03-09 MED ORDER — METRONIDAZOLE IN NACL 5-0.79 MG/ML-% IV SOLN
500.0000 mg | Freq: Once | INTRAVENOUS | Status: AC
  Administered 2020-03-09: 500 mg via INTRAVENOUS
  Filled 2020-03-09: qty 100

## 2020-03-09 MED ORDER — VITAMIN D3 25 MCG (1000 UNIT) PO TABS
1000.0000 [IU] | ORAL_TABLET | Freq: Every day | ORAL | Status: DC
  Administered 2020-03-13 – 2020-03-14 (×2): 1000 [IU] via ORAL
  Filled 2020-03-09 (×7): qty 1

## 2020-03-09 MED ORDER — SODIUM CHLORIDE 0.9 % IV SOLN: 2.0000 g | Freq: Once | INTRAVENOUS | Status: DC

## 2020-03-09 MED ORDER — SODIUM CHLORIDE 0.9 % IV BOLUS
1000.0000 mL | Freq: Once | INTRAVENOUS | Status: AC
  Administered 2020-03-09: 1000 mL via INTRAVENOUS

## 2020-03-09 MED ORDER — ASPIRIN EC 81 MG PO TBEC
81.0000 mg | DELAYED_RELEASE_TABLET | Freq: Every day | ORAL | Status: DC
  Administered 2020-03-13 – 2020-03-14 (×2): 81 mg via ORAL
  Filled 2020-03-09 (×2): qty 1

## 2020-03-09 MED ORDER — ACETAMINOPHEN 325 MG PO TABS: 650.0000 mg | ORAL_TABLET | Freq: Four times a day (QID) | ORAL | Status: DC | PRN

## 2020-03-09 MED ORDER — VANCOMYCIN HCL IN DEXTROSE 1-5 GM/200ML-% IV SOLN: 1000.0000 mg | INTRAVENOUS | Status: DC

## 2020-03-09 MED ORDER — SODIUM CHLORIDE 0.9 % IV BOLUS (SEPSIS)
1000.0000 mL | Freq: Once | INTRAVENOUS | Status: AC
  Administered 2020-03-09: 1000 mL via INTRAVENOUS

## 2020-03-09 MED ORDER — VANCOMYCIN HCL IN DEXTROSE 1-5 GM/200ML-% IV SOLN
1000.0000 mg | Freq: Once | INTRAVENOUS | Status: AC
  Administered 2020-03-09: 1000 mg via INTRAVENOUS
  Filled 2020-03-09: qty 200

## 2020-03-09 MED ORDER — PANTOPRAZOLE SODIUM 40 MG IV SOLR
40.0000 mg | INTRAVENOUS | Status: DC
  Administered 2020-03-09 – 2020-03-14 (×6): 40 mg via INTRAVENOUS
  Filled 2020-03-09 (×6): qty 40

## 2020-03-09 MED ORDER — PANTOPRAZOLE SODIUM 40 MG PO TBEC: 40.0000 mg | DELAYED_RELEASE_TABLET | Freq: Every day | ORAL | Status: DC

## 2020-03-09 MED ORDER — ATORVASTATIN CALCIUM 20 MG PO TABS
40.0000 mg | ORAL_TABLET | Freq: Every day | ORAL | Status: DC
  Administered 2020-03-09: 40 mg via ORAL
  Filled 2020-03-09: qty 2

## 2020-03-09 MED ORDER — SODIUM CHLORIDE 0.9 % IV BOLUS
500.0000 mL | Freq: Once | INTRAVENOUS | Status: AC
  Administered 2020-03-09: 500 mL via INTRAVENOUS

## 2020-03-09 MED ORDER — ENOXAPARIN SODIUM 40 MG/0.4ML ~~LOC~~ SOLN: 40.0000 mg | SUBCUTANEOUS | Status: DC

## 2020-03-09 MED ORDER — ACETAMINOPHEN 650 MG RE SUPP
650.0000 mg | Freq: Once | RECTAL | Status: AC
  Administered 2020-03-09: 650 mg via RECTAL
  Filled 2020-03-09: qty 1

## 2020-03-09 MED ORDER — SODIUM CHLORIDE 0.9 % IV SOLN
2.0000 g | Freq: Two times a day (BID) | INTRAVENOUS | Status: DC
  Administered 2020-03-10 – 2020-03-11 (×3): 2 g via INTRAVENOUS
  Filled 2020-03-09 (×5): qty 2

## 2020-03-09 MED ORDER — METRONIDAZOLE IN NACL 5-0.79 MG/ML-% IV SOLN
500.0000 mg | Freq: Three times a day (TID) | INTRAVENOUS | Status: DC
  Administered 2020-03-10 – 2020-03-11 (×5): 500 mg via INTRAVENOUS
  Filled 2020-03-09 (×8): qty 100

## 2020-03-09 MED ORDER — APIXABAN 5 MG PO TABS
5.0000 mg | ORAL_TABLET | Freq: Two times a day (BID) | ORAL | Status: DC
  Administered 2020-03-09: 5 mg via ORAL
  Filled 2020-03-09: qty 1

## 2020-03-09 MED ORDER — NOREPINEPHRINE 4 MG/250ML-% IV SOLN
INTRAVENOUS | Status: AC
  Administered 2020-03-09: 2 ug/min via INTRAVENOUS
  Filled 2020-03-09: qty 250

## 2020-03-09 MED ORDER — NOREPINEPHRINE 4 MG/250ML-% IV SOLN: 2.0000 ug/min | INTRAVENOUS | Status: DC

## 2020-03-09 MED ORDER — METHYLPREDNISOLONE SODIUM SUCC 125 MG IJ SOLR
INTRAMUSCULAR | Status: AC
  Filled 2020-03-09: qty 2

## 2020-03-09 MED ORDER — FAMOTIDINE 20 MG PO TABS
20.0000 mg | ORAL_TABLET | Freq: Every day | ORAL | Status: DC
  Administered 2020-03-13 – 2020-03-14 (×2): 20 mg via ORAL
  Filled 2020-03-09 (×2): qty 1

## 2020-03-09 MED ORDER — VANCOMYCIN HCL IN DEXTROSE 1-5 GM/200ML-% IV SOLN: 1000.0000 mg | Freq: Once | INTRAVENOUS | Status: DC

## 2020-03-09 MED ORDER — SODIUM BICARBONATE 8.4 % IV SOLN: 50.0000 meq | Freq: Once | INTRAVENOUS | Status: AC

## 2020-03-09 MED ORDER — ACETAMINOPHEN 650 MG RE SUPP: 650.0000 mg | Freq: Four times a day (QID) | RECTAL | Status: DC | PRN

## 2020-03-09 MED ORDER — SODIUM CHLORIDE 0.9 % IV SOLN
2.0000 g | Freq: Once | INTRAVENOUS | Status: AC
  Administered 2020-03-09: 2 g via INTRAVENOUS
  Filled 2020-03-09: qty 2

## 2020-03-09 MED ORDER — AMIODARONE HCL 200 MG PO TABS
200.0000 mg | ORAL_TABLET | Freq: Every day | ORAL | Status: DC
  Filled 2020-03-09: qty 1

## 2020-03-09 MED ORDER — ALBUMIN HUMAN 25 % IV SOLN: 25.0000 g | Freq: Once | INTRAVENOUS | Status: DC

## 2020-03-09 MED ORDER — METHYLPREDNISOLONE SODIUM SUCC 40 MG IJ SOLR
40.0000 mg | Freq: Two times a day (BID) | INTRAMUSCULAR | Status: DC
  Administered 2020-03-09 – 2020-03-14 (×10): 40 mg via INTRAVENOUS
  Filled 2020-03-09 (×9): qty 1

## 2020-03-09 MED ORDER — FUROSEMIDE 20 MG PO TABS
10.0000 mg | ORAL_TABLET | Freq: Every day | ORAL | Status: DC
  Filled 2020-03-09: qty 0.5

## 2020-03-09 MED ORDER — INSULIN ASPART 100 UNIT/ML ~~LOC~~ SOLN
0.0000 [IU] | SUBCUTANEOUS | Status: DC
  Administered 2020-03-10 (×2): 2 [IU] via SUBCUTANEOUS
  Administered 2020-03-10: 1 [IU] via SUBCUTANEOUS
  Administered 2020-03-10: 2 [IU] via SUBCUTANEOUS
  Administered 2020-03-10: 1 [IU] via SUBCUTANEOUS
  Administered 2020-03-11 (×4): 2 [IU] via SUBCUTANEOUS
  Administered 2020-03-11 – 2020-03-12 (×5): 1 [IU] via SUBCUTANEOUS
  Administered 2020-03-13: 2 [IU] via SUBCUTANEOUS
  Administered 2020-03-13 – 2020-03-14 (×9): 3 [IU] via SUBCUTANEOUS
  Filled 2020-03-09 (×23): qty 1

## 2020-03-09 MED ORDER — VANCOMYCIN HCL 750 MG/150ML IV SOLN
750.0000 mg | Freq: Once | INTRAVENOUS | Status: AC
  Administered 2020-03-09: 750 mg via INTRAVENOUS
  Filled 2020-03-09: qty 150

## 2020-03-09 MED ORDER — SODIUM BICARBONATE 8.4 % IV SOLN
INTRAVENOUS | Status: AC
  Administered 2020-03-09: 50 meq via INTRAVENOUS
  Filled 2020-03-09: qty 100

## 2020-03-09 MED ORDER — ONDANSETRON HCL 4 MG PO TABS: 4.0000 mg | ORAL_TABLET | Freq: Four times a day (QID) | ORAL | Status: DC | PRN

## 2020-03-09 NOTE — ED Notes (Signed)
resp rate is not zero, pt with lead off, keeps removing. Light blanket provided, pt repeatedly asking to "go upstairs" "get me a bed". Pt with peticheal area noted to right lower leg, dana, np with CCU and dr. Damita Dunnings have seen and assessed area.

## 2020-03-09 NOTE — ED Provider Notes (Signed)
[facility] Emergency Department Provider Note  Time seen: 5:13 PM  I have reviewed the triage vital signs and the nursing notes.   HISTORY  Chief Complaint Weakness  HPI Jason Montes is a 69 y.o. male with a past medical history of CKD, diabetes, gastric reflux, hypertension, hyperlipidemia, CVA, presents to the emergency department for altered mental status.   EMS brought the patient from home for reported confusion and altered mental status.  Here the patient is awake and will occasionally attempt answer question with yes no answer but otherwise does not follow commands does not answer most questions though once he does answer largely inaccurate.  Patient found to be febrile to 100.0 as well as tachypneic.  Unable to complete a review of systems questioning.  No further history known at this time.  Per record review patient was discharged from the hospital several weeks ago for metabolic encephalopathy discharged to rehab center at that time.  Past Medical History:  Diagnosis Date  . Allergy   . Atrial fibrillation ([place])   . B12 deficiency   . Chronic kidney disease   . Chronic kidney disease (CKD), stage II (mild)   . Coronary atherosclerosis   . Diabetes mellitus without complication ([place])   . Elevated PSA   . Esophagitis   . GERD (gastroesophageal reflux disease)   . Heart attack ([place])   . Heart disease   . Hyperlipidemia   . Hypertension   . Hypokalemia   . Mild neurocognitive disorder   . Osteoarthritis   . Presbyopia   . PVD (peripheral vascular disease) ([place])   . Restless leg syndrome   . Stroke (cerebrum) ([place])   . Stroke ([place])   . Subacute osteomyelitis ([place])   . Tinea unguium   . Uncompensated short term memory deficit     Patient Active Problem List   Diagnosis Date Noted  . Decubitus ulcer 02/16/2020  . Suicidal ideation   . Acute blood loss anemia   . Acute lower UTI   . Thrombocytopenia ([place])   . Hyponatremia   .  Post-operative pain   . Benign essential HTN   . Diabetic peripheral neuropathy ([place])   . Labile blood glucose   . Chronic systolic congestive heart failure ([place])   . Urinary retention   . Encounter for psychological evaluation 02/01/2020  . Aspiration pneumonia of both lower lobes due to gastric secretions ([place]) 01/31/2020  . Wernicke encephalopathy 01/31/2020  . Acute urinary retention 01/28/2020  . Hypotension 01/28/2020  . Nonsustained ventricular tachycardia ([place]) 01/28/2020  . Pressure injury of skin 01/20/2020  . Acute metabolic encephalopathy 46/27/0350  . Osteomyelitis of great toe of left foot ([place]) 01/11/2020  . Type II diabetes mellitus with renal manifestations ([place]) 01/11/2020  . Stroke ([place]) 01/11/2020  . Depression 01/11/2020  . Suicidal behavior 01/11/2020  . Subacute osteomyelitis of left foot ([place])   . Intertrochanteric fracture of right femur, closed, initial encounter ([place]) 11/15/2019  . Accidental fall 11/15/2019  . Preoperative clearance 11/15/2019  . Closed right hip fracture, initial encounter ([place]) 11/15/2019  . Diabetic foot infection ([place])   . Stage 3a chronic kidney disease ([place])   . Cellulitis of left foot 10/07/2019  . Protein-calorie malnutrition, severe ([place]) 03/13/2019  . Acute renal failure superimposed on stage 3a chronic kidney disease ([place]) 03/11/2019  . Family history of malignant neoplasm of gastrointestinal tract   . Benign neoplasm of transverse colon   . Benign neoplasm of cecum   .  Benign neoplasm of ascending colon   . Benign neoplasm of descending colon   . Polyp of sigmoid colon   . Atrial fibrillation ([place]) 02/25/2018  . Vitamin B12 deficiency anemia 02/25/2018  . CKD (chronic kidney disease) stage 2, GFR 60-89 ml/min 02/25/2018  . CAD (coronary artery disease) 02/25/2018  . DM (diabetes mellitus), type 2, uncontrolled with complications ([place]) 99/24/2683  . GERD with esophagitis 02/25/2018  . Generalized muscle weakness  02/25/2018  . Chronic systolic CHF (congestive heart failure) ([place]) 02/25/2018  . Chronic anticoagulation 02/25/2018  . 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 Hyperlipidemia 02/25/2018  . Essential hypertension 02/25/2018  . Hypokalemia 02/25/2018  . Mild neurocognitive disorder 02/25/2018  . Osteoarthritis 02/25/2018  . PVD (peripheral vascular disease) ([place]) 02/25/2018  . Presbyopia 02/25/2018  . Restless leg syndrome 02/25/2018  . History of stroke with current residual effects 02/25/2018  . Subacute osteomyelitis of right foot ([place]) 02/25/2018  . Tinea unguium 02/25/2018    Past Surgical History:  Procedure Laterality Date  . AMPUTATION TOE Left 01/13/2020   Procedure: AMPUTATION RAY LEFT 1ST;  Surgeon: Caroline More, DPM;  Location: [facility] ORS;  Service: Podiatry;  Laterality: Left;  . CARDIAC PACEMAKER PLACEMENT    . COLONOSCOPY WITH PROPOFOL N/A 05/18/2018   Procedure: COLONOSCOPY WITH PROPOFOL;  Surgeon: [name], MD;  Location: [facility];  Service: Endoscopy;  Laterality: N/A;  . CORONARY ARTERY BYPASS GRAFT    . FOOT AMPUTATION Right   . INTRAMEDULLARY (IM) NAIL INTERTROCHANTERIC Right 11/16/2019   Procedure: INTRAMEDULLARY (IM) NAIL INTERTROCHANTRIC;  Surgeon: Thornton Park, MD;  Location: [facility] ORS;  Service: Orthopedics;  Laterality: Right;  . LOWER EXTREMITY ANGIOGRAPHY Left 10/10/2019   Procedure: Lower Extremity Angiography;  Surgeon: Algernon Huxley, MD;  Location: [place] [facility];  Service: Cardiovascular;  Laterality: Left;  . LOWER EXTREMITY ANGIOGRAPHY Left 01/16/2020   Procedure: Lower Extremity Angiography;  Surgeon: Algernon Huxley, MD;  Location: [place] [facility];  Service: Cardiovascular;  Laterality: Left;    Prior to Admission medications   Medication Sig Start Date End Date Taking? Authorizing Provider  acetaminophen (TYLENOL) 325 MG tablet Take 2 tablets (650 mg total) by mouth every 6 (six) hours as needed for mild pain (or Fever >/= 101).  02/23/20   Angiulli, Lavon Paganini, PA-C  amiodarone (PACERONE) 400 MG tablet Take 0.5 tablets (200 mg total) by mouth daily. 02/23/20   Angiulli, Lavon Paganini, PA-C  apixaban (ELIQUIS) 5 MG TABS tablet Take 1 tablet (5 mg total) by mouth 2 (two) times daily. 02/23/20   Angiulli, Lavon Paganini, PA-C  ascorbic acid (VITAMIN C) 500 MG tablet Take 1 tablet (500 mg total) by mouth 2 (two) times daily. 02/23/20   Angiulli, Lavon Paganini, PA-C  aspirin EC 81 MG tablet Take 81 mg by mouth daily.    [provider]  atorvastatin (LIPITOR) 40 MG tablet Take 1 tablet (40 mg total) by mouth at bedtime. 02/23/20   Angiulli, Lavon Paganini, PA-C  Cholecalciferol (VITAMIN D3) 25 MCG (1000 UT) CAPS Take 1 capsule (1,000 Units total) by mouth daily. 02/23/20   Angiulli, Lavon Paganini, PA-C  DULoxetine (CYMBALTA) 20 MG capsule Take 1 capsule (20 mg total) by mouth daily. 02/23/20   Angiulli, Lavon Paganini, PA-C  famotidine (PEPCID) 20 MG tablet Take 1 tablet (20 mg total) by mouth daily. 02/23/20   Angiulli, Lavon Paganini, PA-C  furosemide (LASIX) 20 MG tablet Take 0.5 tablets (10 mg total) by mouth daily. 02/24/20  Angiulli, Lavon Paganini, PA-C  gabapentin (NEURONTIN) 100 MG capsule Take 2 capsules (200 mg total) by mouth 3 (three) times daily. 02/23/20   Angiulli, Lavon Paganini, PA-C  haloperidol (HALDOL) 1 MG tablet Take 1 tablet (1 mg total) by mouth 2 (two) times daily. 02/23/20   Angiulli, Lavon Paganini, PA-C  insulin aspart (NOVOLOG) 100 UNIT/ML FlexPen Inject 11 Units into the skin 3 (three) times daily with meals. 02/23/20   Angiulli, Lavon Paganini, PA-C  insulin glargine (LANTUS) 100 UNIT/ML Solostar Pen Inject 14 Units into the skin daily. 02/23/20   Angiulli, Lavon Paganini, PA-C  Multiple Vitamin (MULTIVITAMIN WITH MINERALS) TABS tablet Take 1 tablet by mouth daily. 02/24/20   Angiulli, Lavon Paganini, PA-C  nitroGLYCERIN (NITROSTAT) 0.4 MG SL tablet Place 1 tablet (0.4 mg total) under the tongue every 5 (five) minutes as needed for chest pain. 02/23/20   Angiulli, Lavon Paganini, PA-C   OLANZapine zydis (ZYPREXA) 15 MG disintegrating tablet Take 1 tablet (15 mg total) by mouth at bedtime. 02/23/20   Angiulli, Lavon Paganini, PA-C  pantoprazole (PROTONIX) 40 MG tablet Take 1 tablet (40 mg total) by mouth daily. 02/27/20   Angiulli, Lavon Paganini, PA-C  polyethylene glycol (MIRALAX / GLYCOLAX) 17 g packet Take 17 g by mouth 2 (two) times daily. 02/03/20   Enzo Bi, MD  senna (SENOKOT) 8.6 MG TABS tablet Take 2 tablets by mouth in the morning and at bedtime.    [provider]  tamsulosin (FLOMAX) 0.4 MG CAPS capsule Take 2 capsules (0.8 mg total) by mouth daily. 02/24/20   Angiulli, Lavon Paganini, PA-C  vitamin B-12 1000 MCG tablet Take 1 tablet (1,000 mcg total) by mouth daily. 02/24/20   Angiulli, Lavon Paganini, PA-C  zinc sulfate 220 (50 Zn) MG capsule Take 1 capsule (220 mg total) by mouth daily. 02/24/20   Angiulli, Lavon Paganini, PA-C    Allergies  Allergen Reactions  . Amlodipine Other (See Comments)  . Penicillins     Childhood allergy, not sure what happens  Other reaction(s): Other, Other  . Norvasc [Amlodipine Besylate]     Unknown    Family History  Problem Relation Age of Onset  . Heart failure Mother   . Colon cancer Mother   . Diabetes Mother   . Thyroid disease Sister     Social History Social History   Tobacco Use  . Smoking status: Never Smoker  . Smokeless tobacco: Never Used  Vaping Use  . Vaping Use: Never used  Substance Use Topics  . Alcohol use: Never  . Drug use: Never    Review of Systems Unable to obtain an adequate/accurate review of systems secondary to altered mental status  ____________________________________________   PHYSICAL EXAM:  VITAL SIGNS: ED Triage Vitals  Enc Vitals Group     BP 04/01/2020 1706 (!) 148/55     Pulse Rate 03/29/2020 1706 94     Resp 03/24/2020 1706 (!) 38     Temp --      Temp src --      SpO2 03/26/2020 1706 97 %     Weight 03/16/2020 1707 175 lb (79.4 kg)     Height --      Head Circumference --      Peak Flow --       Pain Score 03/26/2020 1706 0     Pain Loc --      Pain Edu? --      Excl. in [place]? --     Constitutional:  Patient is awake, but is not responding to questions for the most part will occasionally answer yes or no but seems to be an accurate.  Patient is not following commands.  Will occasionally take a look at you when you are talking to him.  Is somewhat tremulous Eyes: Normal exam ENT      Head: Normocephalic and atraumatic.      Mouth/Throat: Mucous membranes are moist. Cardiovascular: Normal rate, regular rhythm around 100 bpm Respiratory: Patient is tachypneic but no wheeze rales or rhonchi is on exam.  Equal breath sounds. Gastrointestinal: Soft and nontender. No distention.  No reaction to abdominal palpation Musculoskeletal: Nontender with normal range of motion in all extremities.  Neurologic: Patient will occasionally answer yes or no questions but otherwise is not speaking.  Unable to complete a full neurological assessment at this time Skin:  Skin is warm, but is pale in appearance. Psychiatric: Altered, confused  ____________________________________________    EKG  EKG viewed and interpreted by myself shows indeterminate rhythm due to significant logical interference due to the patient's tremor and tachypnea.  Rate is 92 bpm appears to have a slightly widened QRS with a possible rightward axis deviation, nonspecific ST changes with occasional PVCs.  ____________________________________________    RADIOLOGY  Chest x-ray shows possible bilateral pneumonia. CT head negative for acute abnormality.  ____________________________________________   INITIAL IMPRESSION / ASSESSMENT AND PLAN / ED COURSE  Pertinent labs & imaging results that were available during my care of the patient were reviewed by me and considered in my medical decision making (see chart for details).   Patient presents to the emergency department by EMS for altered mental status found to be febrile  to 100.0 as well as tachypneic.  Given these findings and lack of other known history at this time we will check labs, cultures, start on broad-spectrum antibiotics we will also swab for Covid obtain a chest x-ray and a CT scan of the head.  Patient's CT scan of the head is negative, chest x-ray shows possible pneumonia.  White blood cell count is elevated.  Patient does have 2 chronic appearing wounds to the left foot but none of which appear overly infected.  Patient's lactate is extremely elevated, receiving IV fluids and broad-spectrum antibiotics.  Patient is febrile consistent with sepsis.  Patient admitted to the hospital service for ongoing IV antibiotics and close monitoring.  ADHRIT KRENZ was evaluated in Emergency Department on 03/12/2020 for the symptoms described in the history of present illness. He was evaluated in the context of the global COVID-19 pandemic, which necessitated consideration that the patient might be at risk for infection with the SARS-CoV-2 virus that causes COVID-19. Institutional protocols and algorithms that pertain to the evaluation of patients at risk for COVID-19 are in a state of rapid change based on information released by regulatory bodies including the CDC and federal and state organizations. These policies and algorithms were followed during the patient's care in the ED.  CRITICAL CARE Performed by: Harvest Dark   Total critical care time: 45 minutes  Critical care time was exclusive of separately billable procedures and treating other patients.  Critical care was necessary to treat or prevent imminent or life-threatening deterioration.  Critical care was time spent personally by me on the following activities: development of treatment plan with patient and/or surrogate as well as nursing, discussions with consultants, evaluation of patient's response to treatment, examination of patient, obtaining history from patient or surrogate, ordering and  performing treatments  and interventions, ordering and review of laboratory studies, ordering and review of radiographic studies, pulse oximetry and re-evaluation of patient's condition.  ____________________________________________   FINAL CLINICAL IMPRESSION(S) / ED DIAGNOSES  Sepsis Altered mental status   Harvest Dark, MD 03/18/2020 2241

## 2020-03-09 NOTE — ED Notes (Signed)
EDP Paduchowski notified of critical lactic 11.

## 2020-03-09 NOTE — ED Notes (Signed)
Ice packs applied per verbal order.

## 2020-03-09 NOTE — Consult Note (Addendum)
Name: Jason Montes MRN: [id number] DOB: 1951/05/11    ADMISSION DATE:  03/20/2020 CONSULTATION DATE: 03/30/2020  REFERRING MD : Sharion Settler, NP   CHIEF COMPLAINT: Hypotension   BRIEF PATIENT DESCRIPTION:  69 yo male admitted with acute metabolic encephalopathy, mild metabolic acidosis, and  hypotension secondary to sepsis and hypovolemia requiring levophed gtt   SIGNIFICANT EVENTS/STUDIES:  10/8: Pt admitted to the stepdown unit, however remained in the ER pending bed availability  10/8: CT Abd Pelvis revealed diffuse pancolonic edematous mural thickening with faint hazy pericolonic stranding, consistent with a nonspecific colitis of infectious or inflammatory etiology. Extensive bilateral perinephric stranding is increased from comparison imaging. This is nonspecific but can be seen in the setting of ascending urinary tract infection given the circumferential bladder wall thickening as well. Recommend correlation with urinalysis. Small bilateral effusions with adjacent atelectatic changes. Some additional heterogenous opacity could reflect further atelectasis, edema or early consolidation. High attenuation material within the gallbladder lumen, possibly biliary sand versus vicarious excretion of contrast though no recent contrast enhanced study is seen in the patient's chart. Correlate with history. Could consider right upper quadrant ultrasound. Some hazy stranding about the liver, nonspecific and possibly accentuated by motion artifact and image noise though can be seen in the setting of hepatitis, given elevated LFTs. Additional features of anasarca with body wall edema. Aortic Atherosclerosis (ICD10-I70.0). 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 Luana Shu and debrided during the earlier part of the day on 03/08/2020) Tinea Unguium, Subacute Osteomyelitis, Pacemaker, Stroke, Restless Leg Syndrome, PVD, Presbyopia, Mild Neurocognitive Disorder, Hypokalemia, HTN, HLD, MI, GERD, Esophagitis, Elevated PSA, Type II Diabetes Mellitus, Coronary Atherosclerosis, CKD Stage II, B12 Deficiency, Atrial Fibrillation, and Allergies.  He presented to [facility] ER on 10/8 with altered mental status.  Upon arrival to the ER pt febrile temp 100.0 F and tachypneic.  Lab results revealed Na+ 134, CO2 16, glucose 170, creatinine 1.86, anion gap 20, albumin 3.0, AST 46, ALT 48, ammonia 50, troponin 96, lactic acid 11.0, wbc 21.7, hgb 8.3, tylenol level <34, salicylate level <7.4, and UA revealed glucose >+500 with rare bacteria.  COVID-19 negative, however CXR concerning for possible pneumonia vs. edema.  He received iv fluid resuscitation, cefepime, flagyl, and vancomycin.  Pt subsequently admitted to the stepdown unit per hospitalist team, however remained in the ER pending bed availability.  Due to continued hypotension despite fluid resuscitation PCCM team consulted and levophed gtt initiated.    PAST MEDICAL HISTORY :   has a past medical history of Allergy, Atrial fibrillation ([place]), B12 deficiency, Chronic kidney disease, Chronic kidney disease (CKD), stage II (mild), Coronary atherosclerosis, Diabetes mellitus without complication ([place]), Elevated PSA, Esophagitis, GERD (gastroesophageal reflux disease), Heart attack ([place]), Heart disease, Hyperlipidemia, Hypertension, Hypokalemia, Mild neurocognitive disorder, Osteoarthritis, Presbyopia, PVD (peripheral vascular disease) ([place]), Restless leg syndrome, Stroke (cerebrum) ([place]), Stroke ([place]), Subacute osteomyelitis ([place]), Tinea unguium, and Uncompensated short term memory deficit.  has a past surgical history that includes Coronary artery bypass graft; Cardiac pacemaker  placement; Foot Amputation (Right); Colonoscopy with propofol (N/A, 05/18/2018); Lower Extremity Angiography (Left, 10/10/2019); Intramedullary (im) nail intertrochanteric (Right, 11/16/2019); Amputation toe (Left, 01/13/2020); and Lower Extremity Angiography (Left, 01/16/2020). Prior to Admission medications   Medication Sig Start  Date End Date Taking? Authorizing Provider  amiodarone (PACERONE) 400 MG tablet Take 0.5 tablets (200 mg total) by mouth daily. 02/23/20  Yes Angiulli, Lavon Paganini, PA-C  apixaban (ELIQUIS) 5 MG TABS tablet Take 1 tablet (5 mg total) by mouth 2 (two) times daily. 02/23/20  Yes Angiulli, Lavon Paganini, PA-C  ascorbic acid (VITAMIN C) 500 MG tablet Take 1 tablet (500 mg total) by mouth 2 (two) times daily. 02/23/20  Yes Angiulli, Lavon Paganini, PA-C  aspirin EC 81 MG tablet Take 81 mg by mouth daily.   Yes [provider]  atorvastatin (LIPITOR) 40 MG tablet Take 1 tablet (40 mg total) by mouth at bedtime. 02/23/20  Yes Angiulli, Lavon Paganini, PA-C  Cholecalciferol (VITAMIN D3) 25 MCG (1000 UT) CAPS Take 1 capsule (1,000 Units total) by mouth daily. 02/23/20  Yes Angiulli, Lavon Paganini, PA-C  DULoxetine (CYMBALTA) 20 MG capsule Take 1 capsule (20 mg total) by mouth daily. 02/23/20  Yes Angiulli, Lavon Paganini, PA-C  famotidine (PEPCID) 20 MG tablet Take 1 tablet (20 mg total) by mouth daily. 02/23/20  Yes Angiulli, Lavon Paganini, PA-C  furosemide (LASIX) 20 MG tablet Take 0.5 tablets (10 mg total) by mouth daily. 02/24/20  Yes Angiulli, Lavon Paganini, PA-C  gabapentin (NEURONTIN) 100 MG capsule Take 2 capsules (200 mg total) by mouth 3 (three) times daily. 02/23/20  Yes Angiulli, Lavon Paganini, PA-C  haloperidol (HALDOL) 1 MG tablet Take 1 tablet (1 mg total) by mouth 2 (two) times daily. 02/23/20  Yes Angiulli, Lavon Paganini, PA-C  insulin aspart (NOVOLOG) 100 UNIT/ML FlexPen Inject 11 Units into the skin 3 (three) times daily with meals. 02/23/20  Yes Angiulli, Lavon Paganini, PA-C  insulin glargine (LANTUS) 100 UNIT/ML Solostar  Pen Inject 14 Units into the skin daily. 02/23/20  Yes Angiulli, Lavon Paganini, PA-C  Multiple Vitamin (MULTIVITAMIN WITH MINERALS) TABS tablet Take 1 tablet by mouth daily. 02/24/20  Yes Angiulli, Lavon Paganini, PA-C  OLANZapine zydis (ZYPREXA) 15 MG disintegrating tablet Take 1 tablet (15 mg total) by mouth at bedtime. 02/23/20  Yes Angiulli, Lavon Paganini, PA-C  pantoprazole (PROTONIX) 40 MG tablet Take 1 tablet (40 mg total) by mouth daily. 02/27/20  Yes Angiulli, Lavon Paganini, PA-C  polyethylene glycol (MIRALAX / GLYCOLAX) 17 g packet Take 17 g by mouth 2 (two) times daily. 02/03/20  Yes Enzo Bi, MD  senna (SENOKOT) 8.6 MG TABS tablet Take 2 tablets by mouth in the morning and at bedtime.   Yes [provider]  tamsulosin (FLOMAX) 0.4 MG CAPS capsule Take 2 capsules (0.8 mg total) by mouth daily. 02/24/20  Yes Angiulli, Lavon Paganini, PA-C  vitamin B-12 1000 MCG tablet Take 1 tablet (1,000 mcg total) by mouth daily. 02/24/20  Yes Angiulli, Lavon Paganini, PA-C  zinc sulfate 220 (50 Zn) MG capsule Take 1 capsule (220 mg total) by mouth daily. 02/24/20  Yes Angiulli, Lavon Paganini, PA-C  acetaminophen (TYLENOL) 325 MG tablet Take 2 tablets (650 mg total) by mouth every 6 (six) hours as needed for mild pain (or Fever >/= 101). 02/23/20   Angiulli, Lavon Paganini, PA-C  nitroGLYCERIN (NITROSTAT) 0.4 MG SL tablet Place 1 tablet (0.4 mg total) under the tongue every 5 (five) minutes as needed for chest pain. 02/23/20   Angiulli, Lavon Paganini, PA-C   Allergies  Allergen Reactions  . Amlodipine Other (See Comments)  . Penicillins     Childhood allergy, not sure what happens  Other reaction(s): Other, Other  . Norvasc [Amlodipine Besylate]  Unknown    FAMILY HISTORY:  family history includes Colon cancer in his mother; Diabetes in his mother; Heart failure in his mother; Thyroid disease in his sister. SOCIAL HISTORY:  reports that he has never smoked. He has never used smokeless tobacco. He reports that he does not drink alcohol and  does not use drugs.  REVIEW OF SYSTEMS:   Unable to assess pt confused   SUBJECTIVE:  Pt confused to situation   VITAL SIGNS: Temp:  [99.3 F (37.4 C)-103.2 F (39.6 C)] 99.3 F (37.4 C) (10/08 2316) Pulse Rate:  [90-116] 90 (10/08 2316) Resp:  [14-38] 22 (10/08 2316) BP: (81-148)/(30-62) 81/38 (10/08 2316) SpO2:  [89 %-100 %] 100 % (10/08 2316) Weight:  [79.4 kg] 79.4 kg (10/08 1707)  PHYSICAL EXAMINATION: General: chronically ill appearing male, resting in bed slightly agitated NAD  Neuro: alert, oriented to person only, follows commands HEENT: supple, no JVD Cardiovascular: nsr, rrr, no R/G Lungs: faint rhonchi throughout, even, non labored  Abdomen: hypoactive BS x4, soft, non tender, non distended  Musculoskeletal: right foot transmetatarsal amputation, left great toe amputation  Skin: right foot ulceration with purulent drainage at great toe amputation site, scattered scabbed over abrasions bilateral lower extremities, scattered petechial areas RLE, RLE cool touch with 1+ palpable posterior tibial pulse    Recent Labs  Lab 03/23/2020 1755  NA 134*  K 4.5  CL 98  CO2 16*  BUN 23  CREATININE 1.86*  GLUCOSE 170*   Recent Labs  Lab 03/03/2020 1834  HGB 8.3*  HCT 27.7*  WBC 21.7*  PLT 284   CT ABDOMEN PELVIS WO CONTRAST  Result Date: 03/06/2020 CLINICAL DATA:  Abnormal LFTs, possible sepsis EXAM: CT ABDOMEN AND PELVIS WITHOUT CONTRAST TECHNIQUE: Multidetector CT imaging of the abdomen and pelvis was performed following the standard protocol without IV contrast. COMPARISON:  CT 04/08/2019 FINDINGS: Motion degraded imaging of the lower chest and upper abdomen. Imaging quality further degraded due to increased image noise from the patient's arms over the upper abdomen. Lower chest: Small bilateral effusions. Adjacent atelectatic changes are present. Some additional heterogenous opacity could reflect further atelectasis at, edema or early consolidation. Evidence of prior  sternotomy and CABG. Extensive calcification of the native coronary arteries. Pacer lead terminates at the cardiac apex. No pericardial effusion. Hepatobiliary: No visible focal liver lesion on this unenhanced CT. Some mild perihepatic hazy stranding may be present though this is possibly accentuated by extensive motion artifact and streak in the upper abdomen. No focal liver lesion is seen. High attenuation material seen within the gallbladder lumen, possibly biliary sand versus vicarious excretion of contrast though should correlate for recent contrast enhanced studies, none available at the time of interpretation. No focal pericholecystic inflammation is seen. No biliary ductal dilatation or visible intraductal gallstones. Pancreas: Unremarkable. No pancreatic ductal dilatation or surrounding inflammatory changes. Spleen: Normal in size. No concerning splenic lesions. Adrenals/Urinary Tract: Normal adrenal glands. Extensive bilateral perinephric stranding is increased from comparison imaging. No discernible focal renal lesion. Renal vascular calcifications. No urolithiasis or hydronephrosis. Circumferential bladder wall thickening is present with some focal thickening towards the anterosuperior bladder dome, possibly reactive. Stomach/Bowel: Small hiatal hernia. Stomach mildly distended with air and ingested fluid. No significant small bowel thickening or dilatation. Diffuse pancolonic edematous mural thickening with faint hazy pericolonic stranding. No evidence of obstruction. High attenuation fecal material, could reflect ingested bismuth containing products, inspissated stool or previously administered enteric contrast media. Vascular/Lymphatic: Atherosclerotic calcifications within the abdominal aorta and branch  vessels. No aneurysm or ectasia. No enlarged abdominopelvic lymph nodes. Some edematous periaortic nodes are present. No pathologically enlarged abdominopelvic lymph nodes. Reproductive: Mild  prostatomegaly. Seminal vesicles are unremarkable. Other: Diffuse body wall edema. Edematous changes in the central mesentery. Trace free fluid in the pelvis. No free air. No organized collection or abscess. Tiny fat containing right inguinal hernia. No bowel containing hernias. Musculoskeletal: Multilevel degenerative changes are present in the imaged portions of the spine. Additional moderate degenerative changes in the hips as well as fusion across the bilateral SI joints. Prior sternotomy. Prior right femoral intramedullary nail and transcervical pin. No acute osseous abnormality or suspicious osseous lesion. IMPRESSION: 1. Diffuse pancolonic edematous mural thickening with faint hazy pericolonic stranding, consistent with a nonspecific colitis of infectious or inflammatory etiology. 2. Extensive bilateral perinephric stranding is increased from comparison imaging. This is nonspecific but can be seen in the setting of ascending urinary tract infection given the circumferential bladder wall thickening as well. Recommend correlation with urinalysis. 3. Small bilateral effusions with adjacent atelectatic changes. Some additional heterogenous opacity could reflect further atelectasis, edema or early consolidation. 4. High attenuation material within the gallbladder lumen, possibly biliary sand versus vicarious excretion of contrast though no recent contrast enhanced study is seen in the patient's chart. Correlate with history. Could consider right upper quadrant ultrasound. 5. Some hazy stranding about the liver, nonspecific and possibly accentuated by motion artifact and image noise though can be seen in the setting of hepatitis, given elevated LFTs. 6. Additional features of anasarca with body wall edema. 7. Aortic Atherosclerosis (ICD10-I70.0). Electronically Signed   By: Lovena Le M.D.   On: 03/12/2020 21:44   CT Head Wo Contrast  Result Date: 03/08/2020 CLINICAL DATA:  69 year old male with altered  mental status. EXAM: CT HEAD WITHOUT CONTRAST TECHNIQUE: Contiguous axial images were obtained from the base of the skull through the vertex without intravenous contrast. COMPARISON:  Head CT dated 01/23/2020. FINDINGS: Brain: There is mild age-related atrophy and chronic microvascular ischemic changes. 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 There is no acute intracranial hemorrhage. No mass effect or midline shift. No extra-axial fluid collection. Vascular: No hyperdense vessel or unexpected calcification. Skull: Normal. Negative for fracture or focal lesion. Sinuses/Orbits: No acute finding. Other: None IMPRESSION: 1. No acute intracranial hemorrhage. 2. Mild age-related atrophy and chronic microvascular ischemic changes. 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 Electronically Signed   By: Anner Crete M.D.   On: 03/11/2020 18:03   DG Chest Port 1 View  Result Date: 03/15/2020 CLINICAL DATA:  Possible sepsis EXAM: PORTABLE CHEST 1 VIEW COMPARISON:  Radiograph 01/17/2020 FINDINGS: Mild airways thickening with some increasing interstitial and patchy opacities in lung bases and retrocardiac space. Background of chronically coarsened interstitial features. No pneumothorax or visible effusion. Pacer/AICD overlies the left chest wall. Post sternotomy and CABG changes likely with abandoned epicardial pacer leads in the upper abdomen. Chronic cardiomegaly with a calcified aorta, not significantly changed accounting for differences in technique. The osseous structures appear diffusely demineralized which may limit detection of small or nondisplaced fractures. No acute osseous or soft tissue abnormality. Moderate to severe degenerative changes are present in the imaged spine and shoulders. IMPRESSION: 1. Mild airways thickening with some increasing interstitial and patchy opacities in the lung bases and retrocardiac space. Could  reflect developing infection or edema. 2. Chronic interstitial changes. 3. Cardiomegaly similar to prior. Prior sternotomy and CABG. 4.  Aortic  Atherosclerosis (ICD10-I70.0). Electronically Signed   By: Lovena Le M.D.   On: 03/21/2020 18:20    ASSESSMENT / PLAN:  Hypotension secondary to sepsis and hypovolemia  Hx: HTN, Pacemaker, HLD, HTN, MI, PVD, [place], Systolic CHF, and Atrial Fibrillation  Continuous telemetry monitoring  Gentle fluid resuscitation and prn levophed gtt to maintain map >65  Hold outpatient antihypertensives  Continue outpatient amiodarone, apixaban, and atorvastatin   Acute on chronic renal failure with lactic and metabolic acidosis  Trend BMP and lactic acid  Replace electrolytes as indicated  Monitor UOP  Avoid nephrotoxic medications  Sodium bicarb x1 amp   Leukocytosis secondary to aspiration pneumonia, colitis, and possible UTI Chronic left foot osteomyelitis  Trend WBC and monitor fever curve  Trend PCT  Follow cultures  Continue vancomycin and cefepime for now, can consider discontinuing cefepime and starting zosyn   Elevated LFT's Trend hepatic panel  Will check acute hepatitis panel  Acute metabolic and toxic encephalopathy Frequent reorientation  Avoid sedating medication when possible    Marda Stalker, [place] Pager [phone] (please enter 7 digits) PCCM Consult Pager [phone] (please enter 7 digits)

## 2020-03-09 NOTE — ED Notes (Signed)
Pt's boxers dry. Pt repositioned.

## 2020-03-09 NOTE — ED Notes (Signed)
New blue and lav tubes sent to lab as requested.

## 2020-03-09 NOTE — Progress Notes (Signed)
ANTICOAGULATION CONSULT NOTE - Initial Consult  Pharmacy Consult for Apixaban Indication: atrial fibrillation  Allergies  Allergen Reactions  . Amlodipine Other (See Comments)  . Penicillins     Childhood allergy, not sure what happens  Other reaction(s): Other, Other  . Norvasc [Amlodipine Besylate]     Unknown    Patient Measurements: Weight: 79.4 kg (175 lb) Heparin Dosing Weight:    Vital Signs: Temp: 102.3 F (39.1 C) (10/08 2102) Temp Source: Axillary (10/08 2102) BP: 118/50 (10/08 2030) Pulse Rate: 96 (10/08 2124)  Labs: Recent Labs    03/23/2020 1755 03/29/2020 1834 03/14/2020 2000  HGB  --  8.3*  --   HCT  --  27.7*  --   PLT  --  284  --   APTT 31  --   --   LABPROT 19.9*  --   --   INR 1.8*  --   --   CREATININE 1.86*  --   --   TROPONINIHS 96*  --  128*    Estimated Creatinine Clearance: 38.5 mL/min (A) (by C-G formula based on SCr of 1.86 mg/dL (H)).   Medical History: Past Medical History:  Diagnosis Date  . Allergy   . Atrial fibrillation ([place])   . B12 deficiency   . Chronic kidney disease   . Chronic kidney disease (CKD), stage II (mild)   . Coronary atherosclerosis   . Diabetes mellitus without complication ([place])   . Elevated PSA   . Esophagitis   . GERD (gastroesophageal reflux disease)   . Heart attack ([place])   . Heart disease   . Hyperlipidemia   . Hypertension   . Hypokalemia   . Mild neurocognitive disorder   . Osteoarthritis   . Presbyopia   . PVD (peripheral vascular disease) ([place])   . Restless leg syndrome   . Stroke (cerebrum) ([place])   . Stroke ([place])   . Subacute osteomyelitis ([place])   . Tinea unguium   . Uncompensated short term memory deficit     Assessment: Patient is a 69yo male with history of non-valvular afib. Pharmacy consulted for Apixaban dosing. Patient was taking Apixaban 5mg  PO BID prior to admission.  Plan:  Will continue home dose of Apixaban 5mg  PO BID. Will monitor labs per protocol.  Paulina Fusi,  PharmD, BCPS 03/14/2020 9:44 PM

## 2020-03-09 NOTE — ED Notes (Signed)
Switched BP cuff to pt's L ankle as both arms contract most of the time d/t the stroke per pt.

## 2020-03-09 NOTE — ED Notes (Signed)
First Nurse Note: Pt to ED via [facility] for nausea. Pt HR 130. Pt has wound on right heel x 1 week. Pt is in NAD.

## 2020-03-09 NOTE — Progress Notes (Signed)
PHARMACY -  BRIEF ANTIBIOTIC NOTE   Pharmacy has received consult(s) for aztreonam --> cefepime and vancomycin from an ED provider.  The patient's profile has been reviewed for ht/wt/allergies/indication/available labs.    Pt has previously given cefepime numerous times. Will change aztreonam to cefepime per aztreonam consult comments.   One time order(s) placed for cefepime 2 g IV x1 and additional vancomycin 750 mg IV x1 (in addition to 1g order from EDP) for total vancomycin loading dose of 1750 mg.   Further antibiotics/pharmacy consults should be ordered by admitting physician if indicated.                       Thank you, Jason Montes 03/20/2020  5:28 PM

## 2020-03-09 NOTE — ED Notes (Signed)
Traveler RN pulled both sets of blood cultures.

## 2020-03-09 NOTE — H&P (Addendum)
History and Physical    [name] YIR:[id number] DOB: August 02, 1950 DOA: 03/21/2020  PCP: [facility]   Patient coming from: home  I have personally briefly reviewed patient's old medical records in [place]  Chief Complaint: Vomiting x5 days, confusion  HPI: Jason Montes is a 69 y.o. male with medical history significant for HTN, HLD, A. 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 Luana Shu and debrided earlier on the day of arrival on 03/03/2020, and with recent prolonged rehab stay from 9/3-9/28 who presents to the emergency room with a several day history of vomiting.  Most of the history taken from his daughter over the phone.  She states patient had been vomiting for the past few days but otherwise was in his usual state of health however EMS reports that patient was confused.  He denies cough, fever, shortness of breath, chest pain, abdominal pain, change in bowel habits but history is unreliable as he mostly answers yes and no and is unable to elaborate ED Course: On arrival he was febrile at 103.2, tachycardic at 107, BP 118/62.  WBC 21,000 with lactic acid of 11.  Creatinine 1.86 above his baseline of 1.3.  Elevation in liver enzymes with AST/ALT of 46/48 and total bili 1.4.  Hemoglobin 8.3 which is around his baseline of 7.7.  Troponin elevated at 96.  Ammonia level 50, acetaminophen and salicylate levels undetectable, ethanol less than 10.  Venous blood gas with PCO2 less than 30 and normal pH.  Covid PCR negative.  Flu test negative EKG as reviewed by me : Atrial fibrillation with a rate of 92 CT head with age-related changes and no acute intracranial abnormalityChest x-ray showing some increasing interstitial and patchy opacities in the lung bases could  reflect developing infection or edema.. Patient started on sepsis fluid bolus and broad-spectrum antibiotics for sepsis of unknown source.  Hospitalist consulted for admission  Review of Systems: As per HPI otherwise all other systems on review of systems negative.    Past Medical History:  Diagnosis Date  . Allergy   . Atrial fibrillation ([place])   . B12 deficiency   . Chronic kidney disease   . Chronic kidney disease (CKD), stage II (mild)   . Coronary atherosclerosis   . Diabetes mellitus without complication ([place])   . Elevated PSA   . Esophagitis   . GERD (gastroesophageal reflux disease)   . Heart attack ([place])   . Heart disease   . Hyperlipidemia   . Hypertension   . Hypokalemia   . Mild neurocognitive disorder   . Osteoarthritis   . Presbyopia   . PVD (peripheral vascular disease) ([place])   . Restless leg syndrome   . Stroke (cerebrum) ([place])   . Stroke ([place])   . Subacute osteomyelitis ([place])   . Tinea unguium   . Uncompensated short term memory deficit     Past Surgical History:  Procedure Laterality Date  . AMPUTATION TOE Left 01/13/2020   Procedure: AMPUTATION RAY LEFT 1ST;  Surgeon: Caroline More, DPM;  Location: [facility] ORS;  Service: Podiatry;  Laterality: Left;  . CARDIAC PACEMAKER PLACEMENT    . COLONOSCOPY WITH PROPOFOL N/A 05/18/2018   Procedure: COLONOSCOPY WITH PROPOFOL;  Surgeon: [name], MD;  Location: [facility] ENDOSCOPY;  Service: Endoscopy;  Laterality: N/A;  .  CORONARY ARTERY BYPASS GRAFT    . FOOT AMPUTATION Right   . INTRAMEDULLARY (IM) NAIL INTERTROCHANTERIC Right 11/16/2019   Procedure: INTRAMEDULLARY (IM) NAIL INTERTROCHANTRIC;  Surgeon: Thornton Park, MD;  Location: [facility] ORS;  Service: Orthopedics;  Laterality: Right;  . LOWER EXTREMITY ANGIOGRAPHY Left 10/10/2019   Procedure: Lower Extremity Angiography;  Surgeon: Algernon Huxley, MD;  Location: [facility];  Service: Cardiovascular;  Laterality: Left;  . LOWER EXTREMITY ANGIOGRAPHY Left  01/16/2020   Procedure: Lower Extremity Angiography;  Surgeon: Algernon Huxley, MD;  Location: [place] [facility];  Service: Cardiovascular;  Laterality: Left;     reports that he has never smoked. He has never used smokeless tobacco. He reports that he does not drink alcohol and does not use drugs.  Allergies  Allergen Reactions  . Amlodipine Other (See Comments)  . Penicillins     Childhood allergy, not sure what happens  Other reaction(s): Other, Other  . Norvasc [Amlodipine Besylate]     Unknown    Family History  Problem Relation Age of Onset  . Heart failure Mother   . Colon cancer Mother   . Diabetes Mother   . Thyroid disease Sister       Prior to Admission medications   Medication Sig Start Date End Date Taking? Authorizing Provider  acetaminophen (TYLENOL) 325 MG tablet Take 2 tablets (650 mg total) by mouth every 6 (six) hours as needed for mild pain (or Fever >/= 101). 02/23/20   Angiulli, Lavon Paganini, PA-C  amiodarone (PACERONE) 400 MG tablet Take 0.5 tablets (200 mg total) by mouth daily. 02/23/20   Angiulli, Lavon Paganini, PA-C  apixaban (ELIQUIS) 5 MG TABS tablet Take 1 tablet (5 mg total) by mouth 2 (two) times daily. 02/23/20   Angiulli, Lavon Paganini, PA-C  ascorbic acid (VITAMIN C) 500 MG tablet Take 1 tablet (500 mg total) by mouth 2 (two) times daily. 02/23/20   Angiulli, Lavon Paganini, PA-C  aspirin EC 81 MG tablet Take 81 mg by mouth daily.    [provider]  atorvastatin (LIPITOR) 40 MG tablet Take 1 tablet (40 mg total) by mouth at bedtime. 02/23/20   Angiulli, Lavon Paganini, PA-C  Cholecalciferol (VITAMIN D3) 25 MCG (1000 UT) CAPS Take 1 capsule (1,000 Units total) by mouth daily. 02/23/20   Angiulli, Lavon Paganini, PA-C  DULoxetine (CYMBALTA) 20 MG capsule Take 1 capsule (20 mg total) by mouth daily. 02/23/20   Angiulli, Lavon Paganini, PA-C  famotidine (PEPCID) 20 MG tablet Take 1 tablet (20 mg total) by mouth daily. 02/23/20   Angiulli, Lavon Paganini, PA-C  furosemide (LASIX) 20 MG tablet  Take 0.5 tablets (10 mg total) by mouth daily. 02/24/20   Angiulli, Lavon Paganini, PA-C  gabapentin (NEURONTIN) 100 MG capsule Take 2 capsules (200 mg total) by mouth 3 (three) times daily. 02/23/20   Angiulli, Lavon Paganini, PA-C  haloperidol (HALDOL) 1 MG tablet Take 1 tablet (1 mg total) by mouth 2 (two) times daily. 02/23/20   Angiulli, Lavon Paganini, PA-C  insulin aspart (NOVOLOG) 100 UNIT/ML FlexPen Inject 11 Units into the skin 3 (three) times daily with meals. 02/23/20   Angiulli, Lavon Paganini, PA-C  insulin glargine (LANTUS) 100 UNIT/ML Solostar Pen Inject 14 Units into the skin daily. 02/23/20   Angiulli, Lavon Paganini, PA-C  Multiple Vitamin (MULTIVITAMIN WITH MINERALS) TABS tablet Take 1 tablet by mouth daily. 02/24/20   Angiulli, Lavon Paganini, PA-C  nitroGLYCERIN (NITROSTAT) 0.4 MG SL tablet Place 1 tablet (0.4 mg  total) under the tongue every 5 (five) minutes as needed for chest pain. 02/23/20   Angiulli, Lavon Paganini, PA-C  OLANZapine zydis (ZYPREXA) 15 MG disintegrating tablet Take 1 tablet (15 mg total) by mouth at bedtime. 02/23/20   Angiulli, Lavon Paganini, PA-C  pantoprazole (PROTONIX) 40 MG tablet Take 1 tablet (40 mg total) by mouth daily. 02/27/20   Angiulli, Lavon Paganini, PA-C  polyethylene glycol (MIRALAX / GLYCOLAX) 17 g packet Take 17 g by mouth 2 (two) times daily. 02/03/20   Enzo Bi, MD  senna (SENOKOT) 8.6 MG TABS tablet Take 2 tablets by mouth in the morning and at bedtime.    [provider]  tamsulosin (FLOMAX) 0.4 MG CAPS capsule Take 2 capsules (0.8 mg total) by mouth daily. 02/24/20   Angiulli, Lavon Paganini, PA-C  vitamin B-12 1000 MCG tablet Take 1 tablet (1,000 mcg total) by mouth daily. 02/24/20   Angiulli, Lavon Paganini, PA-C  zinc sulfate 220 (50 Zn) MG capsule Take 1 capsule (220 mg total) by mouth daily. 02/24/20   Cathlyn Parsons, PA-C    Physical Exam: Vitals:   03/08/2020 1841 03/19/2020 1846 03/20/2020 1848 03/06/2020 1849  BP:   118/62   Pulse:   (!) 116   Resp:      Temp: (!) 103.2 F (39.6 C)      TempSrc: Oral     SpO2:  (!) 89%  96%  Weight:         Vitals:   03/23/2020 1841 03/20/2020 1846 03/08/2020 1848 03/06/2020 1849  BP:   118/62   Pulse:   (!) 116   Resp:      Temp: (!) 103.2 F (39.6 C)     TempSrc: Oral     SpO2:  (!) 89%  96%  Weight:          Constitutional:  Lethargic but will answer questions and oriented x 3 .  Appears acutely ill HEENT:      Head: Normocephalic and atraumatic.         Eyes: PERLA, EOMI, Conjunctivae are normal. Sclera is non-icteric.       Mouth/Throat: Mucous membranes are moist.       Neck: Supple with no signs of meningismus. Cardiovascular:  Tachycardic. No murmurs, gallops, or rubs. 2+ symmetrical distal pulses are present . No JVD. No LE edema Respiratory: Respiratory effort slightly increased.diminished bilaterally gastrointestinal: Soft, non tender, and non distended with positive bowel sounds. No rebound or guarding. Genitourinary: No CVA tenderness. Musculoskeletal:  Right transmet and left first MTP amputations Neurologic:  Face is symmetric. Moving all extremities. No gross focal neurologic deficits . Skin: Skin is warm, dry.  Multiple appearance Psychiatric: Appears anxious.  Otherwise affect appropriate   Labs on Admission: I have personally reviewed following labs and imaging studies  CBC: Recent Labs  Lab 03/03/2020 1834  WBC 21.7*  HGB 8.3*  HCT 27.7*  MCV 85.5  PLT 809   Basic Metabolic Panel: Recent Labs  Lab 03/10/2020 1755  NA 134*  K 4.5  CL 98  CO2 16*  GLUCOSE 170*  BUN 23  CREATININE 1.86*  CALCIUM 9.0   GFR: Estimated Creatinine Clearance: 38.5 mL/min (A) (by C-G formula based on SCr of 1.86 mg/dL (H)). Liver Function Tests: Recent Labs  Lab 03/10/2020 1755  AST 46*  ALT 48*  ALKPHOS 108  BILITOT 1.4*  PROT 9.6*  ALBUMIN 3.0*   No results for input(s): LIPASE, AMYLASE in the last 168 hours. Recent Labs  Lab 03/12/2020 1755  AMMONIA 50*   Coagulation Profile: Recent Labs  Lab  03/06/2020 1755  INR 1.8*   Cardiac Enzymes: No results for input(s): CKTOTAL, CKMB, CKMBINDEX, TROPONINI in the last 168 hours. BNP (last 3 results) No results for input(s): PROBNP in the last 8760 hours. HbA1C: No results for input(s): HGBA1C in the last 72 hours. CBG: Recent Labs  Lab 03/08/2020 1715  GLUCAP 172*   Lipid Profile: No results for input(s): CHOL, HDL, LDLCALC, TRIG, CHOLHDL, LDLDIRECT in the last 72 hours. Thyroid Function Tests: No results for input(s): TSH, T4TOTAL, FREET4, T3FREE, THYROIDAB in the last 72 hours. Anemia Panel: No results for input(s): VITAMINB12, FOLATE, FERRITIN, TIBC, IRON, RETICCTPCT in the last 72 hours. Urine analysis:    Component Value Date/Time   COLORURINE AMBER (A) 02/11/2020 1730   APPEARANCEUR TURBID (A) 02/11/2020 1730   LABSPEC 1.020 02/11/2020 1730   PHURINE 8.0 02/11/2020 1730   GLUCOSEU 50 (A) 02/11/2020 1730   HGBUR NEGATIVE 02/11/2020 1730   BILIRUBINUR NEGATIVE 02/11/2020 1730   KETONESUR NEGATIVE 02/11/2020 1730   PROTEINUR >=300 (A) 02/11/2020 1730   NITRITE NEGATIVE 02/11/2020 1730   LEUKOCYTESUR LARGE (A) 02/11/2020 1730    Radiological Exams on Admission: CT Head Wo Contrast  Result Date: 03/06/2020 CLINICAL DATA:  69 year old male with altered mental status. EXAM: CT HEAD WITHOUT CONTRAST TECHNIQUE: Contiguous axial images were obtained from the base of the skull through the vertex without intravenous contrast. COMPARISON:  Head CT dated 01/23/2020. FINDINGS: Brain: There is mild age-related atrophy and chronic microvascular ischemic changes. 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 There is no acute intracranial hemorrhage. No mass effect or midline shift. No extra-axial fluid collection. Vascular: No hyperdense vessel or unexpected calcification. Skull: Normal. Negative for fracture or focal lesion. Sinuses/Orbits: No acute finding. Other: None IMPRESSION: 1. No acute  intracranial hemorrhage. 2. Mild age-related atrophy and chronic microvascular ischemic changes. 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 Electronically Signed   By: Anner Crete M.D.   On: 03/08/2020 18:03   DG Chest Port 1 View  Result Date: 03/21/2020 CLINICAL DATA:  Possible sepsis EXAM: PORTABLE CHEST 1 VIEW COMPARISON:  Radiograph 01/17/2020 FINDINGS: Mild airways thickening with some increasing interstitial and patchy opacities in lung bases and retrocardiac space. Background of chronically coarsened interstitial features. No pneumothorax or visible effusion. Pacer/AICD overlies the left chest wall. Post sternotomy and CABG changes likely with abandoned epicardial pacer leads in the upper abdomen. Chronic cardiomegaly with a calcified aorta, not significantly changed accounting for differences in technique. The osseous structures appear diffusely demineralized which may limit detection of small or nondisplaced fractures. No acute osseous or soft tissue abnormality. Moderate to severe degenerative changes are present in the imaged spine and shoulders. IMPRESSION: 1. Mild airways thickening with some increasing interstitial and patchy opacities in the lung bases and retrocardiac space. Could reflect developing infection or edema. 2. Chronic interstitial changes. 3. Cardiomegaly similar to prior. Prior sternotomy and CABG. 4.  Aortic Atherosclerosis (ICD10-I70.0). Electronically Signed   By: Lovena Le M.D.   On: 03/06/2020 18:20     Assessment/Plan 69 year old chronically ill male with history of HTN, HLD, A. 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 Luana Shu and debrided earlier on the  day of arrival on 03/22/2020, and with recent prolonged rehab stay  from 9/3-9/28 who presents to the emergency room with a several day history of vomiting.     Severe sepsis/septic shock with acute organ dysfunction ([place]) -Patient with fever, tachycardia,  WBC 21,000 with lactic acid 11 and endorgan dysfunction including AKI, elevated LFTs, and encephalopathy.  BP maintained at 017/49 -Source uncertain but suspecting aspiration pneumonia next patient presented with vomiting and Chest x-ray with possible developing pneumonia  -IV sepsis fluids bolus given in ER to continue with maintenance fluids -Broad-spectrum antibiotics to include vancomycin, cefepime and Flagyl -Follow-up urinalysis -CT abdomen to search for sources of sepsis given vomiting and elevated LFTs   Acute metabolic encephalopathy -Patient with confusion, likely related to severe sepsis in combination with multiple psychotropics -Hold sedating psychotropics: gabapentin, haldol, zyprexa -Treat sepsis as outlined below -Fall, aspiration precautions  Vomiting -Antiemetics, IV Protonix -Follow-up CT abdomen and pelvis -Follow-up UA    Acute renal failure superimposed on stage 3a chronic kidney disease -creatinine 1.8 above baseline of 1.3 -Likely prerenal related to vomiting as well as endorgan dysfunction from sepsis -Monitor for improvement with IV hydration -Avoid nephrotoxins and renally adjust meds    Aspiration pneumonia (HCC) -Broad-spectrum antibiotics as outlined above -Sepsis precautions -We will keep n.p.o. for now pending swallow eval    Hyperammonemia (HCC)    Abnormal LFTs -Possibly related to organ dysfunction related to sepsis -Follow-up CT abdomen and pelvis to evaluate for intra-abdominal pathology    Atrial fibrillation (HCC) with mild RVR -Heart rate 107, all likely related to sepsis -Continue home apixaban and amiodarone   Elevated troponin  CAD (coronary artery disease) -Troponin of 96 suspect all related to demand ischemia -Continue home aspirin,  atorvastatin, NTG sublingual as needed -Addendum post admission: Troponin trended up 96>>128>>203 so though likely still demand, decision made to initiate heparin infusion for possible ACS in view of history of CAD.  DM (diabetes mellitus), type 2, uncontrolled with complications ([place])  -Sliding scale insulin coverage  Peripheral vascular disease Amputation at right midfoot [facility]), left MTP Diabetic foot ulcer associated with type 2 diabetes mellitus ([place]) Chronic osteomyelitis of left foot [facility] -Patient seen by his podiatrist on 10/8 for necrotic wound left foot with chronic osteomyelitis.  Wound looks clean and likely not source of sepsis -Consider podiatry consult during admission    Essential hypertension -Continue home meds     Chronic systolic congestive heart failure ([place]) -On Lasix.  Not on beta-blocker or ACE/ARB likely as he is on amiodarone and has CKD respectively -Monitor for fluid overload in view of IV hydration in the treatment of severe sepsis     Chronic anemia -Hemoglobin of 8.3 around baseline of 7.7 -Continue to monitor    DVT prophylaxis: Apixaban Code Status: DNR, confirmed with daughter on telephone Family Communication: Spoke with daughter Donah Driver on phone  disposition Plan: Back to previous home environment Consults called: none  Status:At the time of admission, it appears that the appropriate admission status for this patient is INPATIENT. This is judged to be reasonable and necessary in order to provide the required intensity of service to ensure the patient's safety given the presenting symptoms, physical exam findings, and initial radiographic and laboratory data in the context of their  Comorbid conditions.   Patient requires inpatient status due to high intensity of service, high risk for further deterioration and high frequency of surveillance required.   I certify that at the point of admission it is  my clinical judgment that the patient will  require inpatient hospital care spanning beyond [place] MD [facility]     03/23/2020, 8:46 PM

## 2020-03-09 NOTE — Progress Notes (Signed)
CODE SEPSIS - PHARMACY COMMUNICATION  **Broad Spectrum Antibiotics should be administered within 1 hour of Sepsis diagnosis**  Time Code Sepsis Called/Page Received: 17:12  Antibiotics Ordered: Cefepime, Vancomycin, Metronidazole  Time of 1st antibiotic administration: Cefepime given at 18:22   Additional action taken by pharmacy:    If necessary, Name of Provider/Nurse Contacted:      Vira Blanco ,PharmD Clinical Pharmacist  03/14/2020  7:15 PM

## 2020-03-09 NOTE — Progress Notes (Addendum)
Cross Cover Note Nurse reports ongoin hypotension post 3 liters IVF and resp rate 20's as well as current lactic 2..9, down from prio.  Due  To history of heart failure and airway  thickening with opacities and chronic interstitial changes.  Currently on cefepime and flagyl for possible developing pneumonia  With concern for worsening heart failure gave albumin and small volume fluid to address low pressure now procalcitonin ordered - curren Steroids started or probable COPD possibly (no documented history that I can find)  Critical care consult  - discussed with Guinevere Scarlet NP

## 2020-03-09 NOTE — Progress Notes (Signed)
Pharmacy Antibiotic Note  Jason Montes is a 69 y.o. male admitted on 03/28/2020 with sepsis.  Pharmacy has been consulted for Cefepime and Vancomycin dosing.  Plan: Vancomycin 1750 mg IV loading dose followed by Vancomcyin 1000 mg IV q24h per [place] Nom[facility]. Monitor SCr.  Cefepime 2g IV q12h  Weight: 79.4 kg (175 lb)  Temp (24hrs), Avg:103.2 F (39.6 C), Min:103.2 F (39.6 C), Max:103.2 F (39.6 C)  Recent Labs  Lab 03/18/2020 1755 03/30/2020 1834  WBC  --  21.7*  CREATININE 1.86*  --   LATICACIDVEN 11.0*  --     Estimated Creatinine Clearance: 38.5 mL/min (A) (by C-G formula based on SCr of 1.86 mg/dL (H)).    Allergies  Allergen Reactions  . Amlodipine Other (See Comments)  . Penicillins     Childhood allergy, not sure what happens  Other reaction(s): Other, Other  . Norvasc [Amlodipine Besylate]     Unknown    Antimicrobials this admission: Cefepime 10/8 >>  Vancomycin 10/8 >>  Metronidazole 10/8 >>   Dose adjustments this admission:  Microbiology results:  Thank you for allowing pharmacy to be a part of this patient's care.  Paulina Fusi, PharmD, BCPS 03/25/2020 8:52 PM

## 2020-03-09 NOTE — ED Notes (Signed)
Provider Prudy Feeler notified of critical elevated trop 128 and temp 102.3 post tylenol suppository.

## 2020-03-09 NOTE — ED Notes (Signed)
Verbal from [employer] to add ice packs to pt for continued fever. Pt denies CP. Pt requesting drink. Notified provider.

## 2020-03-09 NOTE — ED Triage Notes (Signed)
Pt here by EMS.  Just left rehab. Pt unable to state why he is here.  Keeps saying "I can't walk".  Membranes very pale. Low grade temp. +tachypnea

## 2020-03-09 NOTE — ED Notes (Signed)
Pt reports old stroke is one of the reasons he is so weak. Pt states hasn't been able to urinate in past few days. Will notify doc and cath soon. External cath in place.

## 2020-03-09 NOTE — ED Notes (Addendum)
Pt has post-op boot in place on L foot with ace wrap underneath it. Pt had old amputation of portion of R foot. Ankle monitor noted on L ankle.

## 2020-03-09 NOTE — ED Notes (Signed)
Pt leaving for CT.  

## 2020-03-09 NOTE — ED Notes (Signed)
Pt vomiting.

## 2020-03-09 NOTE — ED Notes (Signed)
Armboard placed on pt bc he kept bending his arms and IV pump would alarm stopping infusions.

## 2020-03-10 ENCOUNTER — Inpatient Hospital Stay: Payer: No Typology Code available for payment source

## 2020-03-10 ENCOUNTER — Inpatient Hospital Stay (HOSPITAL_COMMUNITY)
Admit: 2020-03-10 | Discharge: 2020-03-10 | Disposition: A | Discharge status: Home / Self Care | Payer: No Typology Code available for payment source | Attending: Internal Medicine | Admitting: Internal Medicine

## 2020-03-10 ENCOUNTER — Inpatient Hospital Stay: Admit: 2020-03-10 | Payer: No Typology Code available for payment source

## 2020-03-10 DIAGNOSIS — I7025 Atherosclerosis of native arteries of other extremities with ulceration: Secondary | ICD-10-CM

## 2020-03-10 DIAGNOSIS — R6520 Severe sepsis without septic shock: Secondary | ICD-10-CM

## 2020-03-10 DIAGNOSIS — R7881 Bacteremia: Secondary | ICD-10-CM

## 2020-03-10 DIAGNOSIS — R9431 Abnormal electrocardiogram [ECG] [EKG]: Secondary | ICD-10-CM

## 2020-03-10 DIAGNOSIS — I340 Nonrheumatic mitral (valve) insufficiency: Secondary | ICD-10-CM

## 2020-03-10 DIAGNOSIS — J690 Pneumonitis due to inhalation of food and vomit: Secondary | ICD-10-CM

## 2020-03-10 DIAGNOSIS — B9561 Methicillin susceptible Staphylococcus aureus infection as the cause of diseases classified elsewhere: Secondary | ICD-10-CM

## 2020-03-10 DIAGNOSIS — A419 Sepsis, unspecified organism: Secondary | ICD-10-CM | POA: Diagnosis not present

## 2020-03-10 LAB — BLOOD CULTURE ID PANEL (REFLEXED) - BCID2

## 2020-03-10 LAB — COMPREHENSIVE METABOLIC PANEL
ALT: 153 U/L — ABNORMAL HIGH (ref 0–44)
AST: 228 U/L — ABNORMAL HIGH (ref 15–41)
Albumin: 2.5 g/dL — ABNORMAL LOW (ref 3.5–5.0)
Alkaline Phosphatase: 90 U/L (ref 38–126)
Anion gap: 12 (ref 5–15)
BUN: 25 mg/dL — ABNORMAL HIGH (ref 8–23)
CO2: 17 mmol/L — ABNORMAL LOW (ref 22–32)
Calcium: 7.8 mg/dL — ABNORMAL LOW (ref 8.9–10.3)
Chloride: 104 mmol/L (ref 98–111)
Creatinine, Ser: 1.62 mg/dL — ABNORMAL HIGH (ref 0.61–1.24)
GFR, Estimated: 43 mL/min — ABNORMAL LOW (ref >60)
Glucose, Bld: 172 mg/dL — ABNORMAL HIGH (ref 70–99)
Potassium: 3.7 mmol/L (ref 3.5–5.1)
Sodium: 133 mmol/L — ABNORMAL LOW (ref 135–145)
Total Bilirubin: 1.4 mg/dL — ABNORMAL HIGH (ref 0.3–1.2)
Total Protein: 7.9 g/dL (ref 6.5–8.1)

## 2020-03-10 LAB — APTT
aPTT: 86 seconds — ABNORMAL HIGH (ref 24–36)
aPTT: 59 seconds — ABNORMAL HIGH (ref 24–36)
aPTT: 95 seconds — ABNORMAL HIGH (ref 24–36)

## 2020-03-10 LAB — ECHOCARDIOGRAM COMPLETE
S' Lateral: 4.65 cm
Weight: 2799.98 oz

## 2020-03-10 LAB — CBC
HCT: 25.6 % — ABNORMAL LOW (ref 39.0–52.0)
Hemoglobin: 7.8 g/dL — ABNORMAL LOW (ref 13.0–17.0)
MCH: 25.7 pg — ABNORMAL LOW (ref 26.0–34.0)
MCHC: 30.5 g/dL (ref 30.0–36.0)
MCV: 84.2 fL (ref 80.0–100.0)
Platelets: 186 10*3/uL (ref 150–400)
RBC: 3.04 MIL/uL — ABNORMAL LOW (ref 4.22–5.81)
RDW: 18 % — ABNORMAL HIGH (ref 11.5–15.5)
WBC: 27.6 10*3/uL — ABNORMAL HIGH (ref 4.0–10.5)
nRBC: 0 % (ref 0.0–0.2)

## 2020-03-10 LAB — PROCALCITONIN: Procalcitonin: <0.1 ng/mL

## 2020-03-10 LAB — GLUCOSE, CAPILLARY
Glucose-Capillary: 141 mg/dL — ABNORMAL HIGH (ref 70–99)
Glucose-Capillary: 144 mg/dL — ABNORMAL HIGH (ref 70–99)
Glucose-Capillary: 154 mg/dL — ABNORMAL HIGH (ref 70–99)
Glucose-Capillary: 158 mg/dL — ABNORMAL HIGH (ref 70–99)
Glucose-Capillary: 172 mg/dL — ABNORMAL HIGH (ref 70–99)
Glucose-Capillary: 191 mg/dL — ABNORMAL HIGH (ref 70–99)

## 2020-03-10 LAB — HEPATITIS PANEL, ACUTE
HCV Ab: NONREACTIVE
Hep A IgM: NONREACTIVE
Hep B C IgM: NONREACTIVE
Hepatitis B Surface Ag: NONREACTIVE

## 2020-03-10 LAB — MRSA PCR SCREENING: MRSA by PCR: POSITIVE — AB

## 2020-03-10 LAB — PROTIME-INR
INR: 2.7 — ABNORMAL HIGH (ref 0.8–1.2)
Prothrombin Time: 27.8 seconds — ABNORMAL HIGH (ref 11.4–15.2)

## 2020-03-10 LAB — CORTISOL-AM, BLOOD: Cortisol - AM: 50.3 ug/dL — ABNORMAL HIGH (ref 6.7–22.6)

## 2020-03-10 LAB — HEPARIN LEVEL (UNFRACTIONATED)
Heparin Unfractionated: 2.38 IU/mL — ABNORMAL HIGH (ref 0.30–0.70)
Heparin Unfractionated: 1.73 IU/mL — ABNORMAL HIGH (ref 0.30–0.70)

## 2020-03-10 LAB — TROPONIN I (HIGH SENSITIVITY)
Troponin I (High Sensitivity): 203 ng/L (ref <18)
Troponin I (High Sensitivity): 219 ng/L (ref <18)
Troponin I (High Sensitivity): 229 ng/L (ref <18)

## 2020-03-10 LAB — TSH: TSH: 1.952 u[IU]/mL (ref 0.350–4.500)

## 2020-03-10 MED ORDER — MUPIROCIN 2 % EX OINT
1.0000 "application " | TOPICAL_OINTMENT | Freq: Two times a day (BID) | CUTANEOUS | Status: DC
  Administered 2020-03-10 – 2020-03-14 (×8): 1 via NASAL
  Filled 2020-03-10 (×2): qty 22

## 2020-03-10 MED ORDER — CHLORHEXIDINE GLUCONATE CLOTH 2 % EX PADS
6.0000 | MEDICATED_PAD | Freq: Every day | CUTANEOUS | Status: DC
  Administered 2020-03-12 – 2020-03-16 (×5): 6 via TOPICAL

## 2020-03-10 MED ORDER — LORAZEPAM 2 MG/ML IJ SOLN
1.0000 mg | Freq: Once | INTRAMUSCULAR | Status: AC
  Administered 2020-03-10: 1 mg via INTRAVENOUS
  Filled 2020-03-10: qty 1

## 2020-03-10 MED ORDER — CHLORHEXIDINE GLUCONATE CLOTH 2 % EX PADS
6.0000 | MEDICATED_PAD | Freq: Every day | CUTANEOUS | Status: DC
  Administered 2020-03-11: 6 via TOPICAL

## 2020-03-10 MED ORDER — LORAZEPAM 2 MG/ML IJ SOLN
0.5000 mg | Freq: Once | INTRAMUSCULAR | Status: AC
  Administered 2020-03-10: 0.5 mg via INTRAVENOUS
  Filled 2020-03-10: qty 1

## 2020-03-10 MED ORDER — VANCOMYCIN HCL 750 MG/150ML IV SOLN
750.0000 mg | Freq: Two times a day (BID) | INTRAVENOUS | Status: DC
  Administered 2020-03-10 – 2020-03-12 (×4): 750 mg via INTRAVENOUS
  Filled 2020-03-10 (×7): qty 150

## 2020-03-10 MED ORDER — HEPARIN (PORCINE) 25000 UT/250ML-% IV SOLN
1000.0000 [IU]/h | INTRAVENOUS | Status: DC
  Administered 2020-03-10: 950 [IU]/h via INTRAVENOUS
  Filled 2020-03-10 (×2): qty 250

## 2020-03-10 NOTE — ED Notes (Signed)
Pt agitated. Continues to try to put SPO2 device in mouth. Keeps stating "please help me". Pt reassured to keep resting in bed.

## 2020-03-10 NOTE — ED Notes (Signed)
Attempted to call report to ICU.

## 2020-03-10 NOTE — Progress Notes (Addendum)
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)   Essential hypertension   History of stroke with current residual effects   Acute renal failure superimposed on stage 3a chronic kidney disease ([place])   Subacute osteomyelitis of left foot (HCC)   Aspiration pneumonia ([place])   Chronic systolic congestive heart failure (HCC)   Hyperammonemia (HCC)   Diabetic foot ulcer associated with type 2 diabetes mellitus (HCC)   Severe sepsis with acute organ dysfunction (HCC)   Elevated troponin   Chronic anemia   Severe sepsis (HCC)   Abnormal LFTs   Severe sepsis/septic shock:  with acute organ dysfunction. Presented w/ fever, tachycardia, elevated WBC, elevated lactic acid & end organ dysfunction (AKI, transaminitis, & encephalopathy). Etiology unclear, possibly multiple infections. Continue on IV vanco, cefepime & flagyl   Bacteremia: blood cxs growing gram positive cocci, likely MRSA, sens pending. Continue on IV vanco   Aspiration pneumonia: continue on IV abxs. NPO until speech evaluates the pt   Possible colitis: as per CT scan. Etiology unclear. Continue on IV abxs.   Acute metabolic encephalopathy: likely secondary to infection & psychotropics. Continue to hold gabapentin, haldol, zyprexa   AKI on CKDIIIa: baseline Cr 1.3. Likely pre-renal in etiology. Cr is trending down from day prior. Will continue to monitor  Hyperammonia & transaminitis: etiology unclear, will check hepatitis panel.   A. fib: w/ RVR. Likely PAF. Continue on home dose of amiodarone & eliquis. Continue on tele   Elevated troponin: likely secondary to demand ischemia. Will check echo   DM2:  poorly controlled. Continue on SSI w/ accuchecks    PVD: w/ hx of amputation at right midfoot, left MTP & chronic osteomyelitis of left foot. Seen by his podiatrist on 10/8 for necrotic wound left foot with chronic osteomyelitis.  Unlikely source of infection. Podiatry & vascular surgery consulted and recs apprec   HTN: not on any anti-HTN meds as per med rec   Chronic systolic CHF: continue to hold home dose of lasix. Monitor I/Os. Not on BB, ARB/ACE-I    Chronic anemia: no need for a transfusion at this time. Will continue to monitor    DVT prophylaxis: IV heparin  Code Status: DNR Family Communication:  Disposition Plan: likely d/c to SNF  Status is: Inpatient  Remains inpatient appropriate because:Ongoing diagnostic testing needed not appropriate for outpatient work up, IV treatments appropriate due to intensity of illness or inability to take PO and Inpatient level of care appropriate due to severity of illness   Dispo: The patient is from: home               Anticipated d/c is to: SNF              Anticipated d/c date is: > 3 days              Patient currently is not medically stable to d/c.   Consultants:  ID Podiatry  Vascular surg   Procedures:    Antimicrobials:   Subjective: Pt is very confused and unable to answer questions appropriately   Objective: Vitals:   03/10/20 0500 03/10/20 0530 03/10/20 0600 03/10/20 0630  BP: 126/66 (!) 126/59 125/63  Pulse: 84 82 83 83  Resp:      Temp: 98.9 F (37.2 C)     TempSrc: Oral     SpO2: 96% 98% 100% 100%  Weight:        Intake/Output Summary (Last 24 hours) at 03/10/2020 0754 Last data filed at 03/16/2020 2354 Gross per 24 hour  Intake 1900 ml  Output 275 ml  Net 1625 ml   Filed Weights   03/05/2020 1707  Weight: 79.4 kg    Examination:  General exam: Appears uncomfortable  Respiratory system: diminished breath sounds b/l. No rales  Cardiovascular system: S1 & S2 +. No rubs, gallops or clicks.   Gastrointestinal system: Abdomen is nondistended, soft and nontender. Hypoactive bowel sounds heard. Central nervous system: Altered mental status. Unable to follow simple commands  Extremities: left great toe amputation w/ open wound, no pus draining. Right transmetatarsal amputation Psychiatry: Judgement and insight appear abnormal.      Data Reviewed: I have personally reviewed following labs and imaging studies  CBC: Recent Labs  Lab 03/26/2020 1834 03/10/20 0500  WBC 21.7* 27.6*  HGB 8.3* 7.8*  HCT 27.7* 25.6*  MCV 85.5 84.2  PLT 284 778   Basic Metabolic Panel: Recent Labs  Lab 03/31/2020 1755 03/10/20 0500  NA 134* 133*  K 4.5 3.7  CL 98 104  CO2 16* 17*  GLUCOSE 170* 172*  BUN 23 25*  CREATININE 1.86* 1.62*  CALCIUM 9.0 7.8*   GFR: Estimated Creatinine Clearance: 44.2 mL/min (A) (by C-G formula based on SCr of 1.62 mg/dL (H)). Liver Function Tests: Recent Labs  Lab 03/03/2020 1755 03/10/20 0500  AST 46* 228*  ALT 48* 153*  ALKPHOS 108 90  BILITOT 1.4* 1.4*  PROT 9.6* 7.9  ALBUMIN 3.0* 2.5*   No results for input(s): LIPASE, AMYLASE in the last 168 hours. Recent Labs  Lab 03/07/2020 1755  AMMONIA 50*   Coagulation Profile: Recent Labs  Lab 03/28/2020 1755 03/10/20 0500  INR 1.8* 2.7*   Cardiac Enzymes: No results for input(s): CKTOTAL, CKMB, CKMBINDEX, TROPONINI in the last 168 hours. BNP (last 3 results) No results for input(s): PROBNP in the last 8760 hours. HbA1C: No results for input(s): HGBA1C in the last 72 hours. CBG: Recent Labs  Lab 03/13/2020 1715 03/10/20 0000 03/10/20 0504  GLUCAP 172* 191* 172*   Lipid Profile: No results for input(s): CHOL, HDL, LDLCALC, TRIG, CHOLHDL, LDLDIRECT in the last 72 hours. Thyroid Function Tests: Recent Labs    03/30/2020 1755  TSH 1.952   Anemia Panel: No results for input(s): VITAMINB12, FOLATE, FERRITIN, TIBC, IRON, RETICCTPCT in the last 72 hours. Sepsis Labs: Recent Labs  Lab  03/08/2020 1755 03/28/2020 2000  PROCALCITON <0.10  --   LATICACIDVEN 11.0* 2.9*    Recent Results (from the past 240 hour(s))  Blood Culture (routine x 2)     Status: None (Preliminary result)   Collection Time: 03/13/2020  5:55 PM   Specimen: BLOOD  Result Value Ref Range Status   Specimen Description   Final    BLOOD RIGHT ANTECUBITAL Performed at [facility], [address], [place], [place] [address]    Special Requests   Final    BOTTLES DRAWN AEROBIC AND ANAEROBIC Blood Culture results may not be optimal due to an excessive volume of blood received in culture bottles Performed at [facility], [address] [place], [place] [address]    Culture  Setup Time IN BOTH AEROBIC AND ANAEROBIC BOTTLES  Final  Culture PENDING  Incomplete   Report Status PENDING  Incomplete  Blood Culture (routine x 2)     Status: None (Preliminary result)   Collection Time: 03/22/2020  5:55 PM   Specimen: BLOOD  Result Value Ref Range Status   Specimen Description   Final    BLOOD LEFT ANTECUBITAL Performed at [facility], [address], [place], [place] [address]    Special Requests   Final    BOTTLES DRAWN AEROBIC AND ANAEROBIC Blood Culture results may not be optimal due to an excessive volume of blood received in culture bottles Performed at [facility], [address] [place], [place] [address]    Culture  Setup Time ANAEROBIC BOTTLE ONLY  Final   Culture PENDING  Incomplete   Report Status PENDING  Incomplete  Respiratory Panel by RT PCR (Flu A&B, Covid) - Nasopharyngeal Swab     Status: None   Collection Time: 03/13/2020  6:21 PM   Specimen: Nasopharyngeal Swab  Result Value Ref Range Status   SARS Coronavirus 2 by RT PCR NEGATIVE NEGATIVE Final    Comment: (NOTE) SARS-CoV-2 target nucleic acids are NOT DETECTED.  The SARS-CoV-2 RNA is generally detectable in upper respiratoy specimens during the acute phase of infection. The  lowest concentration of SARS-CoV-2 viral copies this assay can detect is 131 copies/mL. A negative result does not preclude SARS-Cov-2 infection and should not be used as the sole basis for treatment or other patient management decisions. A negative result may occur with  improper specimen collection/handling, submission of specimen other than nasopharyngeal swab, presence of viral mutation(s) within the areas targeted by this assay, and inadequate number of viral copies (<131 copies/mL). A negative result must be combined with clinical observations, patient history, and epidemiological information. The expected result is Negative.  Fact Sheet for Patients:  PinkCheek.be  Fact Sheet for Healthcare Providers:  GravelBags.it  This test is no t yet approved or cleared by the [place] FDA and  has been authorized for detection and/or diagnosis of SARS-CoV-2 by FDA under an Emergency Use Authorization (EUA). This EUA will remain  in effect (meaning this test can be used) for the duration of the COVID-19 declaration under Section 564(b)(1) of the Act, 21 U.S.C. section 360bbb-3(b)(1), unless the authorization is terminated or revoked sooner.     Influenza A by PCR NEGATIVE NEGATIVE Final   Influenza B by PCR NEGATIVE NEGATIVE Final    Comment: (NOTE) The Xpert Xpress SARS-CoV-2/FLU/RSV assay is intended as an aid in  the diagnosis of influenza from Nasopharyngeal swab specimens and  should not be used as a sole basis for treatment. Nasal washings and  aspirates are unacceptable for Xpert Xpress SARS-CoV-2/FLU/RSV  testing.  Fact Sheet for Patients: PinkCheek.be  Fact Sheet for Healthcare Providers: GravelBags.it  This test is not yet approved or cleared by the [place] FDA and  has been authorized for detection and/or diagnosis of SARS-CoV-2 by  FDA under  an Emergency Use Authorization (EUA). This EUA will remain  in effect (meaning this test can be used) for the duration of the  Covid-19 declaration under Section 564(b)(1) of the Act, 21  U.S.C. section 360bbb-3(b)(1), unless the authorization is  terminated or revoked. Performed at [facility], [address], [place], [place] [address]          Radiology Studies: CT ABDOMEN PELVIS WO CONTRAST  Result Date: 03/19/2020 CLINICAL DATA:  Abnormal LFTs, possible sepsis EXAM: CT ABDOMEN AND PELVIS WITHOUT  CONTRAST TECHNIQUE: Multidetector CT imaging of the abdomen and pelvis was performed following the standard protocol without IV contrast. COMPARISON:  CT 04/08/2019 FINDINGS: Motion degraded imaging of the lower chest and upper abdomen. Imaging quality further degraded due to increased image noise from the patient's arms over the upper abdomen. Lower chest: Small bilateral effusions. Adjacent atelectatic changes are present. Some additional heterogenous opacity could reflect further atelectasis at, edema or early consolidation. Evidence of prior sternotomy and CABG. Extensive calcification of the native coronary arteries. Pacer lead terminates at the cardiac apex. No pericardial effusion. Hepatobiliary: No visible focal liver lesion on this unenhanced CT. Some mild perihepatic hazy stranding may be present though this is possibly accentuated by extensive motion artifact and streak in the upper abdomen. No focal liver lesion is seen. High attenuation material seen within the gallbladder lumen, possibly biliary sand versus vicarious excretion of contrast though should correlate for recent contrast enhanced studies, none available at the time of interpretation. No focal pericholecystic inflammation is seen. No biliary ductal dilatation or visible intraductal gallstones. Pancreas: Unremarkable. No pancreatic ductal dilatation or surrounding inflammatory changes. Spleen: Normal in size. No  concerning splenic lesions. Adrenals/Urinary Tract: Normal adrenal glands. Extensive bilateral perinephric stranding is increased from comparison imaging. No discernible focal renal lesion. Renal vascular calcifications. No urolithiasis or hydronephrosis. Circumferential bladder wall thickening is present with some focal thickening towards the anterosuperior bladder dome, possibly reactive. Stomach/Bowel: Small hiatal hernia. Stomach mildly distended with air and ingested fluid. No significant small bowel thickening or dilatation. Diffuse pancolonic edematous mural thickening with faint hazy pericolonic stranding. No evidence of obstruction. High attenuation fecal material, could reflect ingested bismuth containing products, inspissated stool or previously administered enteric contrast media. Vascular/Lymphatic: Atherosclerotic calcifications within the abdominal aorta and branch vessels. No aneurysm or ectasia. No enlarged abdominopelvic lymph nodes. Some edematous periaortic nodes are present. No pathologically enlarged abdominopelvic lymph nodes. Reproductive: Mild prostatomegaly. Seminal vesicles are unremarkable. Other: Diffuse body wall edema. Edematous changes in the central mesentery. Trace free fluid in the pelvis. No free air. No organized collection or abscess. Tiny fat containing right inguinal hernia. No bowel containing hernias. Musculoskeletal: Multilevel degenerative changes are present in the imaged portions of the spine. Additional moderate degenerative changes in the hips as well as fusion across the bilateral SI joints. Prior sternotomy. Prior right femoral intramedullary nail and transcervical pin. No acute osseous abnormality or suspicious osseous lesion. IMPRESSION: 1. Diffuse pancolonic edematous mural thickening with faint hazy pericolonic stranding, consistent with a nonspecific colitis of infectious or inflammatory etiology. 2. Extensive bilateral perinephric stranding is increased from  comparison imaging. This is nonspecific but can be seen in the setting of ascending urinary tract infection given the circumferential bladder wall thickening as well. Recommend correlation with urinalysis. 3. Small bilateral effusions with adjacent atelectatic changes. Some additional heterogenous opacity could reflect further atelectasis, edema or early consolidation. 4. High attenuation material within the gallbladder lumen, possibly biliary sand versus vicarious excretion of contrast though no recent contrast enhanced study is seen in the patient's chart. Correlate with history. Could consider right upper quadrant ultrasound. 5. Some hazy stranding about the liver, nonspecific and possibly accentuated by motion artifact and image noise though can be seen in the setting of hepatitis, given elevated LFTs. 6. Additional features of anasarca with body wall edema. 7. Aortic Atherosclerosis (ICD10-I70.0). Electronically Signed   By: Lovena Le M.D.   On: 03/06/2020 21:44   CT Head Wo Contrast  Result Date: 03/15/2020 CLINICAL DATA:  69 year old male with altered mental status. EXAM: CT HEAD WITHOUT CONTRAST TECHNIQUE: Contiguous axial images were obtained from the base of the skull through the vertex without intravenous contrast. COMPARISON:  Head CT dated 01/23/2020. FINDINGS: Brain: There is mild age-related atrophy and chronic microvascular ischemic changes. 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 There is no acute intracranial hemorrhage. No mass effect or midline shift. No extra-axial fluid collection. Vascular: No hyperdense vessel or unexpected calcification. Skull: Normal. Negative for fracture or focal lesion. Sinuses/Orbits: No acute finding. Other: None IMPRESSION: 1. No acute intracranial hemorrhage. 2. Mild age-related atrophy and chronic microvascular ischemic changes. 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  Electronically Signed   By: Anner Crete M.D.   On: 03/03/2020 18:03   DG Chest Port 1 View  Result Date: 03/25/2020 CLINICAL DATA:  Possible sepsis EXAM: PORTABLE CHEST 1 VIEW COMPARISON:  Radiograph 01/17/2020 FINDINGS: Mild airways thickening with some increasing interstitial and patchy opacities in lung bases and retrocardiac space. Background of chronically coarsened interstitial features. No pneumothorax or visible effusion. Pacer/AICD overlies the left chest wall. Post sternotomy and CABG changes likely with abandoned epicardial pacer leads in the upper abdomen. Chronic cardiomegaly with a calcified aorta, not significantly changed accounting for differences in technique. The osseous structures appear diffusely demineralized which may limit detection of small or nondisplaced fractures. No acute osseous or soft tissue abnormality. Moderate to severe degenerative changes are present in the imaged spine and shoulders. IMPRESSION: 1. Mild airways thickening with some increasing interstitial and patchy opacities in the lung bases and retrocardiac space. Could reflect developing infection or edema. 2. Chronic interstitial changes. 3. Cardiomegaly similar to prior. Prior sternotomy and CABG. 4.  Aortic Atherosclerosis (ICD10-I70.0). Electronically Signed   By: Lovena Le M.D.   On: 03/04/2020 18:20        Scheduled Meds: . amiodarone  200 mg Oral Daily  . aspirin EC  81 mg Oral Daily  . atorvastatin  40 mg Oral QHS  . cholecalciferol  1,000 Units Oral Daily  . DULoxetine  20 mg Oral Daily  . famotidine  20 mg Oral Daily  . insulin aspart  0-9 Units Subcutaneous Q4H  . methylPREDNISolone sodium succinate      . methylPREDNISolone (SOLU-MEDROL) injection  40 mg Intravenous Q12H  . pantoprazole (PROTONIX) IV  40 mg Intravenous Q24H   Continuous Infusions: . sodium chloride 150 mL/hr at 03/10/20 0214  . sodium chloride 250 mL (03/18/2020 2333)  . ceFEPime (MAXIPIME) IV    . heparin 950  Units/hr (03/10/20 0129)  . metronidazole Stopped (03/10/20 0226)  . norepinephrine (LEVOPHED) Adult infusion 2 mcg/min (03/10/20 3536)  . vancomycin       LOS: 1 day    Time spent: 33 mins     Wyvonnia Dusky, MD [facility] Pager [phone]  If 7PM-7AM, please contact night-coverage [email or web address] 03/10/2020, 7:54 AM

## 2020-03-10 NOTE — ED Notes (Signed)
Peri care, pt had BM in chux.

## 2020-03-10 NOTE — ED Notes (Signed)
Lab called to draw labs, 2 unsuccessful attempts.

## 2020-03-10 NOTE — Progress Notes (Signed)
[facility] for Heparin  Indication: chest pain/ACS  Allergies  Allergen Reactions  . Amlodipine Other (See Comments)  . Penicillins     Childhood allergy, not sure what happens  Other reaction(s): Other, Other  . Norvasc [Amlodipine Besylate]     Unknown    Patient Measurements: Weight: 79.4 kg (175 lb) Heparin Dosing Weight: 79.4 kg   Vital Signs: Temp: 98.9 F (37.2 C) (10/09 0500) Temp Source: Oral (10/09 0500) BP: 102/57 (10/09 1540) Pulse Rate: 81 (10/09 1540)  Labs: Recent Labs    03/20/2020 1755 03/22/2020 1755 03/05/2020 1834 03/04/2020 2000 03/05/2020 2341 03/10/20 0146 03/10/20 0500 03/10/20 0914 03/10/20 1519  HGB  --   --  8.3*  --   --   --  7.8*  --   --   HCT  --   --  27.7*  --   --   --  25.6*  --   --   PLT  --   --  284  --   --   --  186  --   --   APTT 31  --   --   --   --   --   --  86* 59*  LABPROT 19.9*  --   --   --   --   --  27.8*  --   --   INR 1.8*  --   --   --   --   --  2.7*  --   --   HEPARINUNFRC 2.38*  --   --   --   --   --   --  1.73*  --   CREATININE 1.86*  --   --   --   --   --  1.62*  --   --   TROPONINIHS 96*   < >  --  128* 203* 229*  --   --   --    < > = values in this interval not displayed.    Estimated Creatinine Clearance: 44.2 mL/min (A) (by C-G formula based on SCr of 1.62 mg/dL (H)).   Medical History: Past Medical History:  Diagnosis Date  . Allergy   . Atrial fibrillation ([place])   . B12 deficiency   . Chronic kidney disease   . Chronic kidney disease (CKD), stage II (mild)   . Coronary atherosclerosis   . Diabetes mellitus without complication ([place])   . Elevated PSA   . Esophagitis   . GERD (gastroesophageal reflux disease)   . Heart attack ([place])   . Heart disease   . Hyperlipidemia   . Hypertension   . Hypokalemia   . Mild neurocognitive disorder   . Osteoarthritis   . Presbyopia   . PVD (peripheral vascular disease) ([place])   . Restless leg syndrome   . Stroke  (cerebrum) ([place])   . Stroke ([place])   . Subacute osteomyelitis ([place])   . Tinea unguium   . Uncompensated short term memory deficit      Assessment: Pharmacy consulted to dose heparin in this 69 year old male with ACS/NSTEMI.  Pt was on Eliquis 5 mg PO BID at home, last dose was on 10/08 @ 2224.   CrCl = 44.2 ml/min   10/9 0914 aPTT 86 therapeutic. HL 1.73 still elevated. Continue heparin drip at 950 units/hr  Goal of Therapy:  Heparin level 0.3-0.7 units/ml aPTT 66 - 102  seconds Monitor platelets by anticoagulation protocol: Yes  Plan:  03/10/20 aPTT 59 sec. Slightly subtherapeutic. Will increase heparin drip to 1100 units/hr. Recheck aPTT at 2100 and continue to follow HL with AM labs. CBC daily.  Pearla Dubonnet, PharmD 03/10/2020,3:53 PM

## 2020-03-10 NOTE — ED Notes (Signed)
Attempted to call report to ICU. ICU said they are receiving another critical pt and will call back asap to receive report.

## 2020-03-10 NOTE — ED Notes (Signed)
Pt calling out that "needs to pee". Pt has indwelling foley cath. Pt reassured that can let urine come out into catheter.

## 2020-03-10 NOTE — ED Notes (Signed)
Attempted to call report to ICU, nurse unavailable. ICU nurse will return call

## 2020-03-10 NOTE — ED Notes (Signed)
Pt ripped IV tubing out of IV bag. IV fluid spilled all over bed. Cleaning up bed with tech.

## 2020-03-10 NOTE — Progress Notes (Signed)
PHARMACY - PHYSICIAN COMMUNICATION CRITICAL VALUE ALERT - BLOOD CULTURE IDENTIFICATION (BCID)  Jason Montes is an 69 y.o. male who presented to [facility] on [facility]/16/2020 with a chief complaint of sepsis.  Assessment:  1/4 bottles GPC. BCID detected MRSA. Source could be left foot osteomyelitis / pneumonia. Patient does have a pacemaker in place.  Name of physician (or Provider) Contacted: Dr. Jimmye Norman  Current antibiotics: Vancomycin / Cefepime / Metronidazole  Changes to prescribed antibiotics recommended:  Patient is on recommended antibiotics - No changes needed. Follow-up narrowing of therapy based on culture results.   Results for orders placed or performed during the hospital encounter of 03/23/2020  Blood Culture ID Panel (Reflexed) (Collected: 03/02/2020  5:55 PM)  Result Value Ref Range   Enterococcus faecalis NOT DETECTED NOT DETECTED   Enterococcus Faecium NOT DETECTED NOT DETECTED   Listeria monocytogenes NOT DETECTED NOT DETECTED   Staphylococcus species DETECTED (A) NOT DETECTED   Staphylococcus aureus (BCID) DETECTED (A) NOT DETECTED   Staphylococcus epidermidis NOT DETECTED NOT DETECTED   Staphylococcus lugdunensis NOT DETECTED NOT DETECTED   Streptococcus species NOT DETECTED NOT DETECTED   Streptococcus agalactiae NOT DETECTED NOT DETECTED   Streptococcus pneumoniae NOT DETECTED NOT DETECTED   Streptococcus pyogenes NOT DETECTED NOT DETECTED   A.calcoaceticus-baumannii NOT DETECTED NOT DETECTED   Bacteroides fragilis NOT DETECTED NOT DETECTED   Enterobacterales NOT DETECTED NOT DETECTED   Enterobacter cloacae complex NOT DETECTED NOT DETECTED   Escherichia coli NOT DETECTED NOT DETECTED   Klebsiella aerogenes NOT DETECTED NOT DETECTED   Klebsiella oxytoca NOT DETECTED NOT DETECTED   Klebsiella pneumoniae NOT DETECTED NOT DETECTED   Proteus species NOT DETECTED NOT DETECTED   Salmonella species NOT DETECTED NOT DETECTED   Serratia marcescens NOT DETECTED NOT  DETECTED   Haemophilus influenzae NOT DETECTED NOT DETECTED   Neisseria meningitidis NOT DETECTED NOT DETECTED   Pseudomonas aeruginosa NOT DETECTED NOT DETECTED   Stenotrophomonas maltophilia NOT DETECTED NOT DETECTED   Candida albicans NOT DETECTED NOT DETECTED   Candida auris NOT DETECTED NOT DETECTED   Candida glabrata NOT DETECTED NOT DETECTED   Candida krusei NOT DETECTED NOT DETECTED   Candida parapsilosis NOT DETECTED NOT DETECTED   Candida tropicalis NOT DETECTED NOT DETECTED   Cryptococcus neoformans/gattii NOT DETECTED NOT DETECTED   Meth resistant mecA/C and MREJ DETECTED (A) NOT DETECTED    Benita Gutter 03/10/2020  10:45 AM

## 2020-03-10 NOTE — Progress Notes (Signed)
[place] for Heparin  Indication: chest pain/ACS  Allergies  Allergen Reactions  . Amlodipine Other (See Comments)  . Penicillins     Childhood allergy, not sure what happens  Other reaction(s): Other, Other  . Norvasc [Amlodipine Besylate]     Unknown    Patient Measurements: Weight: 79.4 kg (175 lb) Heparin Dosing Weight: 79.4 kg   Vital Signs: Temp: 98.9 F (37.2 C) (10/09 0500) Temp Source: Oral (10/09 0500) BP: 114/70 (10/09 0830) Pulse Rate: 84 (10/09 0830)  Labs: Recent Labs    03/27/2020 1755 03/26/2020 1755 03/20/2020 1834 03/19/2020 2000 03/19/2020 2341 03/10/20 0146 03/10/20 0500 03/10/20 0914  HGB  --   --  8.3*  --   --   --  7.8*  --   HCT  --   --  27.7*  --   --   --  25.6*  --   PLT  --   --  284  --   --   --  186  --   APTT 31  --   --   --   --   --   --  86*  LABPROT 19.9*  --   --   --   --   --  27.8*  --   INR 1.8*  --   --   --   --   --  2.7*  --   HEPARINUNFRC 2.38*  --   --   --   --   --   --   --   CREATININE 1.86*  --   --   --   --   --  1.62*  --   TROPONINIHS 96*   < >  --  128* 203* 229*  --   --    < > = values in this interval not displayed.    Estimated Creatinine Clearance: 44.2 mL/min (A) (by C-G formula based on SCr of 1.62 mg/dL (H)).   Medical History: Past Medical History:  Diagnosis Date  . Allergy   . Atrial fibrillation ([place])   . B12 deficiency   . Chronic kidney disease   . Chronic kidney disease (CKD), stage II (mild)   . Coronary atherosclerosis   . Diabetes mellitus without complication ([place])   . Elevated PSA   . Esophagitis   . GERD (gastroesophageal reflux disease)   . Heart attack ([place])   . Heart disease   . Hyperlipidemia   . Hypertension   . Hypokalemia   . Mild neurocognitive disorder   . Osteoarthritis   . Presbyopia   . PVD (peripheral vascular disease) ([place])   . Restless leg syndrome   . Stroke (cerebrum) ([place])   . Stroke ([place])   . Subacute osteomyelitis  ([place])   . Tinea unguium   . Uncompensated short term memory deficit      Assessment: Pharmacy consulted to dose heparin in this 69 year old male with ACS/NSTEMI.  Pt was on Eliquis 5 mg PO BID at home, last dose was on 10/08 @ 2224.   CrCl = 38.5 ml/min    Goal of Therapy:  Heparin level 0.3-0.7 units/ml aPTT 66 - 102  seconds Monitor platelets by anticoagulation protocol: Yes   Plan:  10/9 0914 aPTT 86 therapeutic. HL 1.73 still elevated. Continue heparin drip at 950 units/hr. Recheck aPTT at 1500 to confirm.  Tawnya Crook, PharmD 03/10/2020,9:54 AM

## 2020-03-10 NOTE — Anesthesia Preprocedure Evaluation (Addendum)
Anesthesia Evaluation  Patient identified by MRN, date of birth, ID band Patient confused  General Assessment Comment:Patient BIBEMS 10/8 for altered mental status. No definitive clear etiology, possible sepsis from infected great toe.    Reviewed: Allergy & Precautions, NPO status , Patient's Chart, lab work & pertinent test results, Unable to perform ROS - Chart review only  History of Anesthesia Complications Negative for: history of anesthetic complications  Airway Mallampati: III  TM Distance: >3 FB Neck ROM: Full  Mouth opening: Limited Mouth Opening  Dental  (+) Chipped, Poor Dentition, Missing   Pulmonary neg pulmonary ROS, neg sleep apnea, neg COPD, Patient abstained from smoking.Not current smoker,    Pulmonary exam normal breath sounds clear to auscultation       Cardiovascular Exercise Tolerance: Poor METShypertension, + CAD, + Past MI, + Peripheral Vascular Disease and +CHF  (-) dysrhythmias + pacemaker  Rhythm:Regular Rate:Normal - Systolic murmurs TTE 74/8270:  1. Limited study secondary to patient terminating study prematurely.  2. Left ventricular ejection fraction could not be accurately estimated,  grossly 40 to 45%. The left ventricle has mildly decreased function.  Unable to exclude anteroseptal hypokinesis.  3. Right ventricle is not well visualized. Right ventricular systolic  function is grossly mildly reduced. The right ventricular size is normal.  4. Left atrial size was mildly dilated.  5. The mitral valve is normal in structure. Mild mitral valve  regurgitation.  6. The aortic valve was not well visualized. Aortic valve regurgitation  is not visualized.    Neuro/Psych PSYCHIATRIC DISORDERS Depression  Neuromuscular disease CVA, Residual Symptoms    GI/Hepatic GERD  ,(+)     (-) substance abuse  ,   Endo/Other  diabetes  Renal/GU CRFRenal disease     Musculoskeletal   Abdominal    Peds  Hematology   Anesthesia Other Findings Past Medical History: No date: Allergy No date: Atrial fibrillation (HCC) No date: B12 deficiency No date: Chronic kidney disease No date: Chronic kidney disease (CKD), stage II (mild) No date: Coronary atherosclerosis No date: Diabetes mellitus without complication (HCC) No date: Elevated PSA No date: Esophagitis No date: GERD (gastroesophageal reflux disease) No date: Heart attack ([place]) No date: Heart disease No date: Hyperlipidemia No date: Hypertension No date: Hypokalemia No date: Mild neurocognitive disorder No date: Osteoarthritis No date: Presbyopia No date: PVD (peripheral vascular disease) (HCC) No date: Restless leg syndrome No date: Stroke (cerebrum) (HCC) No date: Stroke ([place]) No date: Subacute osteomyelitis (HCC) No date: Tinea unguium No date: Uncompensated short term memory deficit   Reproductive/Obstetrics                           Anesthesia Physical Anesthesia Plan  ASA: III  Anesthesia Plan: General   Post-op Pain Management:    Induction: Intravenous  PONV Risk Score and Plan: 2 and Ondansetron, Propofol infusion, TIVA and Treatment may vary due to age or medical condition  Airway Management Planned: Nasal Cannula and Natural Airway  Additional Equipment: None  Intra-op Plan:   Post-operative Plan:   Informed Consent: I have reviewed the patients History and Physical, chart, labs and discussed the procedure including the risks, benefits and alternatives for the proposed anesthesia with the patient or authorized representative who has indicated his/her understanding and acceptance.   Patient has DNR.  Discussed DNR with power of attorney and Suspend DNR.   Dental advisory given  Plan Discussed with: CRNA and Surgeon  Anesthesia Plan Comments: (  History and phone consent from the patients daughter and POA Donah Driver at [phone] due to patient's altered mental  status. Discussed risks of anesthesia with , including possibility of difficulty with spontaneous ventilation under anesthesia necessitating airway intervention, PONV, and rare risks such as cardiac or respiratory or neurological events. She understands.  DNR discussed with daughter; she believes dad's wishes would be to suspend DNR perioperatively and resume afterwards.)        Anesthesia Quick Evaluation                                  Anesthesia Evaluation  Patient identified by MRN, date of birth, ID band Patient confused    Reviewed: Allergy & Precautions, H&P , NPO status , Patient's Chart, lab work & pertinent test results  History of Anesthesia Complications Negative for: history of anesthetic complications  Airway Mallampati: III  TM Distance: <3 FB Neck ROM: limited    Dental  (+) Chipped, Missing, Poor Dentition   Pulmonary neg pulmonary ROS, neg shortness of breath,    Pulmonary exam normal        Cardiovascular hypertension, + CAD, + Past MI, + Peripheral Vascular Disease and +CHF  Normal cardiovascular exam     Neuro/Psych CVA (right sided weakness), Residual Symptoms negative psych ROS   GI/Hepatic Neg liver ROS, GERD  Medicated and Controlled,  Endo/Other  diabetes, Type 2  Renal/GU Renal disease     Musculoskeletal   Abdominal   Peds  Hematology negative hematology ROS (+)   Anesthesia Other Findings Patient has cardiac clearance for this procedure.   Past Medical History: No date: Allergy No date: Atrial fibrillation (HCC) No date: B12 deficiency No date: Chronic kidney disease No date: Chronic kidney disease (CKD), stage II (mild) No date: Coronary atherosclerosis No date: Diabetes mellitus without complication (HCC) No date: Elevated PSA No date: Esophagitis No date: GERD (gastroesophageal reflux disease) No date: Heart attack ([place]) No date: Heart disease No date: Hyperlipidemia No date: Hypertension No date:  Hypokalemia No date: Mild neurocognitive disorder No date: Osteoarthritis No date: Presbyopia No date: PVD (peripheral vascular disease) (HCC) No date: Restless leg syndrome No date: Stroke (cerebrum) (HCC) No date: Stroke ([place]) No date: Subacute osteomyelitis ([place]) No date: Tinea unguium No date: Uncompensated short term memory deficit  Past Surgical History: No date: CARDIAC PACEMAKER PLACEMENT 05/18/2018: COLONOSCOPY WITH PROPOFOL; N/A     Comment:  Procedure: COLONOSCOPY WITH PROPOFOL;  Surgeon: [name], MD;  Location: [facility] ENDOSCOPY;  Service:               Endoscopy;  Laterality: N/A; No date: CORONARY ARTERY BYPASS GRAFT No date: FOOT AMPUTATION; Right 11/16/2019: INTRAMEDULLARY (IM) NAIL INTERTROCHANTERIC; Right     Comment:  Procedure: INTRAMEDULLARY (IM) NAIL INTERTROCHANTRIC;                Surgeon: Thornton Park, MD;  Location: [facility] ORS;                Service: Orthopedics;  Laterality: Right; 10/10/2019: LOWER EXTREMITY ANGIOGRAPHY; Left     Comment:  Procedure: Lower Extremity Angiography;  Surgeon: Algernon Huxley, MD;  Location: [facility];  Service:  Cardiovascular;  Laterality: Left;     Reproductive/Obstetrics negative OB ROS                             Anesthesia Physical Anesthesia Plan  ASA: IV  Anesthesia Plan: General   Post-op Pain Management:    Induction: Intravenous  PONV Risk Score and Plan: Propofol infusion and TIVA  Airway Management Planned: Natural Airway and Nasal Cannula  Additional Equipment:   Intra-op Plan:   Post-operative Plan:   Informed Consent: I have reviewed the patients History and Physical, chart, labs and discussed the procedure including the risks, benefits and alternatives for the proposed anesthesia with the patient or authorized representative who has indicated his/her understanding and acceptance.     Dental Advisory  Given  Plan Discussed with: Anesthesiologist, CRNA and Surgeon  Anesthesia Plan Comments: (History and phone consent from the patients daughter and POA Donah Driver at [phone]   Daugher consented for risks of anesthesia including but not limited to:  - adverse reactions to medications - risk of intubation if required - damage to eyes, teeth, lips or other oral mucosa - nerve damage due to positioning  - sore throat or hoarseness - Damage to heart, brain, nerves, lungs, other parts of body or loss of life  She voiced understanding.)       Anesthesia Quick Evaluation

## 2020-03-10 NOTE — Progress Notes (Signed)
Called for report from ED RN Mariea Clonts

## 2020-03-10 NOTE — ED Notes (Signed)
Pt had BM in chux. Pt cleaned up. Peri care performed.

## 2020-03-10 NOTE — ED Notes (Signed)
Podiatrist at bedside performing wound culture.

## 2020-03-10 NOTE — Progress Notes (Signed)
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  Vitals On admission Temp 103.2, HR 116, Sao2 -89%, BP 118/62 CBC Latest Ref Rng & Units 03/10/2020 03/18/2020 02/27/2020  WBC 4.0 - 10.5 K/uL 27.6(H) 21.7(H) 8.0  Hemoglobin 13.0 - 17.0 g/dL 7.8(L) 8.3(L) 7.7(L)  Hematocrit 39 - 52 % 25.6(L) 27.7(L) 25.7(L)  Platelets 150 - 400 K/uL 186 284 PLATELET CLUMPS NOTED ON SMEAR, UNABLE TO ESTIMATE   CMP Latest Ref Rng & Units 03/10/2020 03/16/2020 02/17/2020  Glucose 70 - 99 mg/dL 172(H) 170(H) 145(H)  BUN 8 - 23 mg/dL 25(H) 23 27(H)  Creatinine 0.61 - 1.24 mg/dL 1.62(H) 1.86(H) 1.31(H)  Sodium 135 - 145 mmol/L 133(L) 134(L) 131(L)  Potassium 3.5 - 5.1 mmol/L 3.7 4.5 4.6  Chloride 98 - 111 mmol/L 104 98 103  CO2 22 - 32 mmol/L 17(L) 16(L) 22  Calcium 8.9 - 10.3 mg/dL 7.8(L) 9.0 8.5(L)  Total Protein 6.5 - 8.1 g/dL 7.9 9.6(H) -  Total Bilirubin 0.3 - 1.2 mg/dL 1.4(H) 1.4(H) -  Alkaline Phos 38 - 126 U/L 90 108 -  AST 15 - 41 U/L 228(H) 46(H) -  ALT 0 - 44 U/L 153(H) 48(H) -   Impression /recommendation MRSA bacteremia Septic shock Left great toe infection with chronic osteo- likey source for bacteremia Pacemaker- risk for endocarditis AKI on CKD Abnormal LFTS- shock liver  Leucocytosis  On vanco/cefepime/flagyl Recommend podiatry consult 2 d echo Repeat blood culture tomorrow  Lavena Stanford

## 2020-03-10 NOTE — Progress Notes (Signed)
[place] [facility] for Heparin  Indication: chest pain/ACS  Allergies  Allergen Reactions  . Amlodipine Other (See Comments)  . Penicillins     Childhood allergy, not sure what happens  Other reaction(s): Other, Other  . Norvasc [Amlodipine Besylate]     Unknown    Patient Measurements: Height: 5\' 9"  (175.3 cm) Weight: 81.1 kg (178 lb 12.7 oz) IBW/kg (Calculated) : 70.7 Heparin Dosing Weight: 79.4 kg   Vital Signs: Temp: 98.1 F (36.7 C) (10/09 1617) Temp Source: Axillary (10/09 1617) BP: 103/53 (10/09 1900) Pulse Rate: 85 (10/09 1900)  Labs: Recent Labs    03/21/2020 1755 03/11/2020 1755 03/29/2020 1834 03/19/2020 2000 03/27/2020 2341 03/10/20 0146 03/10/20 0500 03/10/20 0914 03/10/20 1519 03/10/20 2132  HGB  --   --  8.3*  --   --   --  7.8*  --   --   --   HCT  --   --  27.7*  --   --   --  25.6*  --   --   --   PLT  --   --  284  --   --   --  186  --   --   --   APTT 31   < >  --   --   --   --   --  86* 59* 95*  LABPROT 19.9*  --   --   --   --   --  27.8*  --   --   --   INR 1.8*  --   --   --   --   --  2.7*  --   --   --   HEPARINUNFRC 2.38*  --   --   --   --   --   --  1.73*  --   --   CREATININE 1.86*  --   --   --   --   --  1.62*  --   --   --   TROPONINIHS 96*  --   --    < > 203* 229*  --   --  219*  --    < > = values in this interval not displayed.    Estimated Creatinine Clearance: 44.2 mL/min (A) (by C-G formula based on SCr of 1.62 mg/dL (H)).   Medical History: Past Medical History:  Diagnosis Date  . Allergy   . Atrial fibrillation ([place])   . B12 deficiency   . Chronic kidney disease   . Chronic kidney disease (CKD), stage II (mild)   . Coronary atherosclerosis   . Diabetes mellitus without complication ([place])   . Elevated PSA   . Esophagitis   . GERD (gastroesophageal reflux disease)   . Heart attack ([place])   . Heart disease   . Hyperlipidemia   . Hypertension   . Hypokalemia   . Mild neurocognitive  disorder   . Osteoarthritis   . Presbyopia   . PVD (peripheral vascular disease) ([place])   . Restless leg syndrome   . Stroke (cerebrum) ([place])   . Stroke ([place])   . Subacute osteomyelitis ([place])   . Tinea unguium   . Uncompensated short term memory deficit      Assessment: Pharmacy consulted to dose heparin in this 69 year old male with ACS/NSTEMI.  Pt was on Eliquis 5 mg PO BID at home, last dose was on 10/08 @ 2224.   CrCl = 44.2 ml/min  10/9 0914 aPTT 86 therapeutic x 1. HL 1.73 still elevated.  10/9 1519 aPTT 59, increased rate to 1100 units/hr 10/9 2132 aPTT 95, therapeutic x 1  Goal of Therapy:  Heparin level 0.3-0.7 units/ml aPTT 66 - 102  seconds Monitor platelets by anticoagulation protocol: Yes   Plan:  10/9 2132 aPTT 95 sec. Is therapeutic. Will continue heparin drip at 1100 units/hr. Recheck aPTT at 0500 for confirmation and continue to follow HL with AM labs. CBC daily.  Paulina Fusi, PharmD, BCPS 03/10/2020 10:05 PM

## 2020-03-10 NOTE — ED Notes (Signed)
Brief checked,  No BM noted.

## 2020-03-10 NOTE — Consult Note (Signed)
[facility]  Patient name: Jason Montes MRN: [id number] DOB: [birth year] Sex: male   REQUESTING PROVIDER:    Hospital service   REASON FOR CONSULT:    Left foot wound  HISTORY OF PRESENT ILLNESS:   AMAURIE WANDEL is a 70 y.o. male, who is status post left PTA of the AT, peroneal, and popliteal artery on 10-10-2019.  He is status post angioplasty of the left AT and peroneal artery in 01-16-2020.  Both proeedures were performed for non-healing wounds.  He was taken to the operating room this morning by Dr. Luana Shu who performed a partial ray amputation.  Minimal bleeding was noted at surgery.  The patient was most recently seen in the vascular surgery clinic last week.  ABIs were obtained.  Vessels were noncompressible, therefore ABIs were artificially elevated.  The waveforms were found to be triphasic.  He came in to the ER on 03-10-2020 with a 5-day history of vomiting and confusion.  He is admitted with sepsis.  His white blood cell count was elevated to 21,000.  He is febrile and tachycardic.  Etiology is uncertain, however aspiration pneumonia is suspected.  The patient is medically managed for hypertension.  He is on a statin for hypercholesterolemia.  He is anticoagulated with Eliquis for atrial fibrillation.  He has stage IIIa chronic renal insufficiency.  He is a poorly controlled diabetic with neuropathy.  He has a history of coronary artery disease, status post CABG.  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    PAST MEDICAL HISTORY    Past Medical History:  Diagnosis Date  . Allergy   . Atrial fibrillation ([place])   . B12 deficiency   . Chronic kidney disease   . Chronic kidney disease (CKD), stage II (mild)   . Coronary atherosclerosis   . Diabetes mellitus without complication ([place])   . Elevated PSA   . Esophagitis   . GERD (gastroesophageal reflux  disease)   . Heart attack ([place])   . Heart disease   . Hyperlipidemia   . Hypertension   . Hypokalemia   . Mild neurocognitive disorder   . Osteoarthritis   . Presbyopia   . PVD (peripheral vascular disease) ([place])   . Restless leg syndrome   . Stroke (cerebrum) ([place])   . Stroke ([place])   . Subacute osteomyelitis ([place])   . Tinea unguium   . Uncompensated short term memory deficit      FAMILY HISTORY   Family History  Problem Relation Age of Onset  . Heart failure Mother   . Colon cancer Mother   . Diabetes Mother   . Thyroid disease Sister     SOCIAL HISTORY:   Social History   Socioeconomic History  . Marital status: Single    Spouse name: Not on file  . Number of children: Not on file  . Years of education: Not on file  . Highest education level: Not on file  Occupational History  . Not on file  Tobacco Use  . Smoking status: Never Smoker  . Smokeless tobacco: Never Used  Vaping Use  . Vaping Use: Never used  Substance and Sexual Activity  . Alcohol use: Never  . Drug use: Never  . Sexual activity: Not on file  Other Topics Concern  . Not on file  Social History Narrative  . Not on file   Social Determinants of Health   Financial Resource Strain:   . Difficulty  of Paying Living Expenses: Not on file  Food Insecurity:   . Worried About Charity fundraiser in the Last Year: Not on file  . Ran Out of Food in the Last Year: Not on file  Transportation Needs:   . Lack of Transportation (Medical): Not on file  . Lack of Transportation (Non-Medical): Not on file  Physical Activity:   . Days of Exercise per Week: Not on file  . Minutes of Exercise per Session: Not on file  Stress:   . Feeling of Stress : Not on file  Social Connections:   . Frequency of Communication with Friends and Family: Not on file  . Frequency of Social Gatherings with Friends and Family: Not on file  . Attends Religious Services: Not on file  . Active Member of Clubs or  Organizations: Not on file  . Attends Archivist Meetings: Not on file  . Marital Status: Not on file  Intimate Partner Violence:   . Fear of Current or Ex-Partner: Not on file  . Emotionally Abused: Not on file  . Physically Abused: Not on file  . Sexually Abused: Not on file    ALLERGIES:    Allergies  Allergen Reactions  . Amlodipine Other (See Comments)  . Penicillins     Childhood allergy, not sure what happens  Other reaction(s): Other, Other  . Norvasc [Amlodipine Besylate]     Unknown    CURRENT MEDICATIONS:    Current Facility-Administered Medications  Medication Dose Route Frequency Provider Last Rate Last Admin  . 0.9 %  sodium chloride infusion   Intravenous Continuous Wyvonnia Dusky, MD 100 mL/hr at 03/10/20 1156 New Bag at 03/10/20 1156  . 0.9 %  sodium chloride infusion  250 mL Intravenous Continuous Awilda Bill, NP   Stopped at 03/10/20 [phone]  . acetaminophen (TYLENOL) tablet 650 mg  650 mg Oral Q6H PRN Athena Masse, MD       Or  . acetaminophen (TYLENOL) suppository 650 mg  650 mg Rectal Q6H PRN Athena Masse, MD      . amiodarone (PACERONE) tablet 200 mg  200 mg Oral Daily Athena Masse, MD      . aspirin EC tablet 81 mg  81 mg Oral Daily Judd Gaudier V, MD      . atorvastatin (LIPITOR) tablet 40 mg  40 mg Oral QHS Athena Masse, MD   40 mg at 03/31/2020 2224  . ceFEPIme (MAXIPIME) 2 g in sodium chloride 0.9 % 100 mL IVPB  2 g Intravenous Q12H Athena Masse, MD   Stopped at 03/10/20 1246  . Chlorhexidine Gluconate Cloth 2 % PADS 6 each  6 each Topical Daily Wyvonnia Dusky, MD      . cholecalciferol (VITAMIN D) tablet 1,000 Units  1,000 Units Oral Daily Judd Gaudier V, MD      . DULoxetine (CYMBALTA) DR capsule 20 mg  20 mg Oral Daily Judd Gaudier V, MD      . famotidine (PEPCID) tablet 20 mg  20 mg Oral Daily Judd Gaudier V, MD      . heparin ADULT infusion 100 units/mL (25000 units/227mL sodium chloride 0.45%)  1,100  Units/hr Intravenous Continuous Wyvonnia Dusky, MD 9.5 mL/hr at 03/10/20 1118 950 Units/hr at 03/10/20 1118  . insulin aspart (novoLOG) injection 0-9 Units  0-9 Units Subcutaneous Q4H Athena Masse, MD   2 Units at 03/10/20 1307  . methylPREDNISolone sodium succinate (SOLU-MEDROL) 40 mg/mL  injection 40 mg  40 mg Intravenous Q12H Sharion Settler, NP   40 mg at 03/10/20 1306  . metroNIDAZOLE (FLAGYL) IVPB 500 mg  500 mg Intravenous Q8H Athena Masse, MD   Stopped at 03/10/20 1439  . norepinephrine (LEVOPHED) 4mg  in 255mL premix infusion  2-10 mcg/min Intravenous Titrated Awilda Bill, NP   Paused at 03/10/20 1420  . ondansetron (ZOFRAN) tablet 4 mg  4 mg Oral Q6H PRN Athena Masse, MD       Or  . ondansetron [facility]) injection 4 mg  4 mg Intravenous Q6H PRN Athena Masse, MD      . pantoprazole (PROTONIX) injection 40 mg  40 mg Intravenous Q24H Athena Masse, MD   40 mg at 03/16/2020 2225  . vancomycin (VANCOREADY) IVPB 750 mg/150 mL  750 mg Intravenous Q12H Wyvonnia Dusky, MD 150 mL/hr at 03/10/20 1506 750 mg at 03/10/20 1506    REVIEW OF SYSTEMS:   [X]  denotes positive finding, [ ]  denotes negative finding Cardiac  Comments:  Chest pain or chest pressure:    Shortness of breath upon exertion:    Short of breath when lying flat:    Irregular heart rhythm:        Vascular    Pain in calf, thigh, or hip brought on by ambulation:    Pain in feet at night that wakes you up from your sleep:     Blood clot in your veins:    Leg swelling:         Pulmonary    Oxygen at home:    Productive cough:     Wheezing:         Neurologic    Sudden weakness in arms or legs:     Sudden numbness in arms or legs:     Sudden onset of difficulty speaking or slurred speech:    Temporary loss of vision in one eye:     Problems with dizziness:         Gastrointestinal    Blood in stool:      Vomited blood:         Genitourinary    Burning when urinating:     Blood in urine:         Psychiatric    Major depression:         Hematologic    Bleeding problems:    Problems with blood clotting too easily:        Skin    Rashes or ulcers:        Constitutional    Fever or chills:     PHYSICAL EXAM:   Vitals:   03/10/20 1520 03/10/20 1530 03/10/20 1540 03/10/20 1617  BP: (!) 105/57 103/62 (!) 102/57 133/73  Pulse: 84 85 81 86  Resp:    (!) 27  Temp:    98.1 F (36.7 C)  TempSrc:    Axillary  SpO2: 100% 100% 100% 100%  Weight:    81.1 kg  Height:    5\' 9"  (1.753 m)    GENERAL: The patient is a well-nourished male, in no acute distress. The vital signs are documented above. CARDIAC: There is a regular rate and rhythm.  VASCULAR: Palpable femoral pulses PULMONARY: Nonlabored respirations ABDOMEN: Soft and non-tender  MUSCULOSKELETAL: There are no major deformities or cyanosis. NEUROLOGIC: No focal weakness or paresthesias are detected. SKIN: Postsurgical dressings in place PSYCHIATRIC: The patient has a normal affect.  STUDIES:   I  have reviewed the following studies from 03/06/2020:  ABI/TBIToday's ABIToday's TBIPrevious ABIPrevious TBI  +-------+-----------+-----------+------------+------------+  Right >1.0 [place]                      +-------+-----------+-----------+------------+------------+  Left  >1.0 [place]                      +-------+-----------+-----------+------------+------------+  All waveforms were triphasic ASSESSMENT and PLAN   Left foot infection: The patient has previously undergone percutaneous revascularization.  Most recent noninvasive imaging shows near normal blood flow down to the ankle.  Toe pressures were not obtained.  I suspect that the lack of blood flow to the foot is secondary to his diabetes.  Most likely, there are no further revascularization options.  I will discuss this further with Dr. Lucky Cowboy and determine whether or not a third arteriogram should be  performed.Leia Alf, MD, FACS [facility] [phone] Pager [phone]

## 2020-03-10 NOTE — ED Notes (Signed)
Full linen change, peri care performed, moisture barrier cream applied to buttocks to stage 1 PI, arm boards applied to IV sites because pt contiues to occlude IV with bending of arm.

## 2020-03-10 NOTE — Progress Notes (Signed)
CRITICAL VALUE ALERT  Critical Value:  MRSA PCR +  Date & Time Notied:  03/10/20 0530   Provider Notified: Jimmye Norman MD  Orders Received/Actions taken: Pending

## 2020-03-10 NOTE — Progress Notes (Signed)
*  PRELIMINARY RESULTS* Echocardiogram 2D Echocardiogram has been performed.  Jason Montes 03/10/2020, 2:43 PM

## 2020-03-10 NOTE — ED Notes (Signed)
Pt has not urinated since took pt over at 2300. MD Damita Dunnings notified as per orders or no urine output. Pt is currently sleeping, male purewick cath system in place.

## 2020-03-10 NOTE — ED Notes (Signed)
Dr. Damita Dunnings notified of troponin 203. No new orders received. Marland Kitchen

## 2020-03-10 NOTE — Progress Notes (Signed)
Pharmacy Antibiotic Note  Jason Montes is a 69 y.o. male admitted on 03/26/2020 with sepsis.  Pharmacy has been consulted for Cefepime and Vancomycin dosing.  Plan: Change vancomycin to 750 mg IV q12h for improvement in renal function  Cefepime 2g IV q12h  Weight: 79.4 kg (175 lb)  Temp (24hrs), Avg:100.9 F (38.3 C), Min:98.9 F (37.2 C), Max:103.2 F (39.6 C)  Recent Labs  Lab 03/24/2020 1755 04/01/2020 1834 03/03/2020 2000 03/10/20 0500  WBC  --  21.7*  --  27.6*  CREATININE 1.86*  --   --  1.62*  LATICACIDVEN 11.0*  --  2.9*  --     Estimated Creatinine Clearance: 44.2 mL/min (A) (by C-G formula based on SCr of 1.62 mg/dL (H)).    Allergies  Allergen Reactions  . Amlodipine Other (See Comments)  . Penicillins     Childhood allergy, not sure what happens  Other reaction(s): Other, Other  . Norvasc [Amlodipine Besylate]     Unknown    Antimicrobials this admission: Cefepime 10/8 >>  Vancomycin 10/8 >>  Metronidazole 10/8 >>     Thank you for allowing pharmacy to be a part of this patient's care.  Dorena Bodo, PharmD 03/10/2020 10:07 AM

## 2020-03-10 NOTE — Progress Notes (Signed)
ANTICOAGULATION CONSULT NOTE - Initial Consult  Pharmacy Consult for Heparin  Indication: chest pain/ACS  Allergies  Allergen Reactions  . Amlodipine Other (See Comments)  . Penicillins     Childhood allergy, not sure what happens  Other reaction(s): Other, Other  . Norvasc [Amlodipine Besylate]     Unknown    Patient Measurements: Weight: 79.4 kg (175 lb) Heparin Dosing Weight: 79.4 kg   Vital Signs: Temp: 99.3 F (37.4 C) (10/08 2316) Temp Source: Oral (10/08 2316) BP: 114/55 (10/09 0100) Pulse Rate: 91 (10/09 0100)  Labs: Recent Labs    03/10/2020 1755 03/05/2020 1834 03/08/2020 2000 03/07/2020 2341  HGB  --  8.3*  --   --   HCT  --  27.7*  --   --   PLT  --  284  --   --   APTT 31  --   --   --   LABPROT 19.9*  --   --   --   INR 1.8*  --   --   --   CREATININE 1.86*  --   --   --   TROPONINIHS 96*  --  128* 203*    Estimated Creatinine Clearance: 38.5 mL/min (A) (by C-G formula based on SCr of 1.86 mg/dL (H)).   Medical History: Past Medical History:  Diagnosis Date  . Allergy   . Atrial fibrillation ([place])   . B12 deficiency   . Chronic kidney disease   . Chronic kidney disease (CKD), stage II (mild)   . Coronary atherosclerosis   . Diabetes mellitus without complication ([place])   . Elevated PSA   . Esophagitis   . GERD (gastroesophageal reflux disease)   . Heart attack ([place])   . Heart disease   . Hyperlipidemia   . Hypertension   . Hypokalemia   . Mild neurocognitive disorder   . Osteoarthritis   . Presbyopia   . PVD (peripheral vascular disease) ([place])   . Restless leg syndrome   . Stroke (cerebrum) ([place])   . Stroke ([place])   . Subacute osteomyelitis ([place])   . Tinea unguium   . Uncompensated short term memory deficit     Medications:  (Not in a hospital admission)   Assessment: Pharmacy consulted to dose heparin in this 69 year old male with ACS/NSTEMI.  Pt was on Eliquis 5 mg PO BID at home, last dose was on 10/08 @ 2224.   CrCl = 38.5  ml/min   10/8 @ 1755:   INR = 1.8 ,  APTT = 31   Goal of Therapy:  Heparin level 0.3-0.7 units/ml aPTT 66 - 102  seconds Monitor platelets by anticoagulation protocol: Yes   Plan:  Will order HL as add-on test to lab drawn on 10/8 @ 1755.  Will order Heparin to start @ 950 units/hr.  Will not bolus this pt due to recent apixaban dose (10/8 @ 2224).   Will use aPTT to guide dosing until HL and aPTT are both therapeutic.   Kinnick Maus D 03/10/2020,1:24 AM

## 2020-03-10 NOTE — Consult Note (Addendum)
PODIATRY / FOOT AND ANKLE SURGERY CONSULTATION NOTE  Requesting Physician: Eppie Gibson, MD  Reason for consult: Left foot infection, sepsis  Chief Complaint: Feeling unwell overall   HPI: Jason Montes is a 69 y.o. male with past medical history of right transmetatarsal amputation, left hallux amputation, CKD, diabetes type 2 uncontrolled with polyneuropathy, gastric reflux, hypertension, CVA, recent potential overdose on prescription medications who presented to the emergency room due to altered mental status.  Patient was brought in by EMS due to reported confusion and altered mental status and low-grade fever.  Patient was worked up in the ED and noted to have sepsis of unknown origin.  Podiatry team was consulted for further evaluation of the left foot.  Patient was recently seen in clinic and noted to have bone exposed but did not have any redness or swelling around the wound or purulence, x-rays were taken at that time which showed chronic stable osteomyelitis.  There was discussion with the patient about performing a potential revision operation in the next week involving a partial left first ray amputation.  Patient was also recently seen by the vascular surgery team last week and they had no plans for any revision revascularization attempts.  Patient did not follow-up in clinic after the amputation was performed about 2 months ago to his left hallux and presented to clinic for the first time earlier this week and failed to follow-up.  Patient was noted to have a notable wound dehiscence over the area of the amputation site with an area that probes to bone, but once again there were no acute signs of infection present.  Patient had completed antibiotic therapy as well.  Patient has a previous history of right transmetatarsal amputation.  She also has noted that he obtained a pressure ulcer while in rehab to the posterior aspect of the left heel and has a small ulceration to the distal tip  of the left second toe.  Patient presents today resting in bed and is communicative and answering questions.  PMHx:  Past Medical History:  Diagnosis Date  . Allergy   . Atrial fibrillation ([place])   . B12 deficiency   . Chronic kidney disease   . Chronic kidney disease (CKD), stage II (mild)   . Coronary atherosclerosis   . Diabetes mellitus without complication ([place])   . Elevated PSA   . Esophagitis   . GERD (gastroesophageal reflux disease)   . Heart attack ([place])   . Heart disease   . Hyperlipidemia   . Hypertension   . Hypokalemia   . Mild neurocognitive disorder   . Osteoarthritis   . Presbyopia   . PVD (peripheral vascular disease) ([place])   . Restless leg syndrome   . Stroke (cerebrum) ([place])   . Stroke ([place])   . Subacute osteomyelitis ([place])   . Tinea unguium   . Uncompensated short term memory deficit     Surgical Hx:  Past Surgical History:  Procedure Laterality Date  . AMPUTATION TOE Left 01/13/2020   Procedure: AMPUTATION RAY LEFT 1ST;  Surgeon: Caroline More, DPM;  Location: [facility] ORS;  Service: Podiatry;  Laterality: Left;  . CARDIAC PACEMAKER PLACEMENT    . COLONOSCOPY WITH PROPOFOL N/A 05/18/2018   Procedure: COLONOSCOPY WITH PROPOFOL;  Surgeon: [name], MD;  Location: [facility] END[facility]COPY;  Service: Endoscopy;  Laterality: N/A;  . CORONARY ARTERY BYPASS GRAFT    . FOOT AMPUTATION Right   . INTRAMEDULLARY (IM) NAIL INTERTROCHANTERIC Right 11/16/2019   Procedure: INTRAMEDULLARY (IM) NAIL  INTERTROCHANTRIC;  Surgeon: Thornton Park, MD;  Location: [facility] ORS;  Service: Orthopedics;  Laterality: Right;  . LOWER EXTREMITY ANGIOGRAPHY Left 10/10/2019   Procedure: Lower Extremity Angiography;  Surgeon: Algernon Huxley, MD;  Location: [place] [facility];  Service: Cardiovascular;  Laterality: Left;  . LOWER EXTREMITY ANGIOGRAPHY Left 01/16/2020   Procedure: Lower Extremity Angiography;  Surgeon: Algernon Huxley, MD;  Location: [place] [facility];  Service: Cardiovascular;   Laterality: Left;    FHx:  Family History  Problem Relation Age of Onset  . Heart failure Mother   . Colon cancer Mother   . Diabetes Mother   . Thyroid disease Sister     Social History:  reports that he has never smoked. He has never used smokeless tobacco. He reports that he does not drink alcohol and does not use drugs.  Allergies:  Allergies  Allergen Reactions  . Amlodipine Other (See Comments)  . Penicillins     Childhood allergy, not sure what happens  Other reaction(s): Other, Other  . Norvasc [Amlodipine Besylate]     Unknown    Review of Systems: General ROS: positive for  - fatigue and fever Respiratory ROS: positive for - cough Cardiovascular ROS: no chest pain or dyspnea on exertion Gastrointestinal ROS: positive for - abdominal pain and change in stools Musculoskeletal ROS: positive for - joint swelling Neurological ROS: positive for - numbness/tingling Dermatological ROS: positive for left foot wounds x 3  (Not in a hospital admission)   Physical Exam: General: Alert and oriented.  No apparent distress.  Vascular: DP/PT pulses nonpalpable bilateral.  Capillary fill time does appear to be intact overall to the left midfoot and forefoot and right foot.  No hair growth noted to digits and foot bilateral.  Minimal to no edema present to the left foot and no erythema..  Neuro: Light touch sensation absent to left foot.  Derm: Left distal second toe ulceration appears to be stable at this time measures 0.1 x 0.1 x 0.1 cm and appears to be superficial with no probing to bone, no drainage, no purulence.  Left posterior heel ulceration appears to present as a stable eschar that measures approximately 0.4 x 0.3 by unstageable, no erythema or edema present to this area.  Left hallux amputation site appears to be dehisced with 2 separate wounds one measuring 3 x 3 cm x 0.5 and probes to bone has exposed bone present.  The other wound appears to be slightly lateral  to this area and measures approximately 0.4 x 0.4 x 0.3 cm but does not have bone exposed at that level.  Picture shown below.  Bone appears to be hard on palpation overall and no softness is noted.  No corresponding erythema, mild edema, only serous drainage, no purulence, no odor.    MSK: Right transmetatarsal amputation, left hallux amputation.  Results for orders placed or performed during the hospital encounter of 03/08/2020 (from the past 48 hour(s))  Glucose, capillary     Status: Abnormal   Collection Time: 03/27/2020  5:15 PM  Result Value Ref Range   Glucose-Capillary 172 (H) 70 - 99 mg/dL    Comment: Glucose reference range applies only to samples taken after fasting for at least 8 hours.  Blood gas, venous     Status: Abnormal (Preliminary result)   Collection Time: 03/14/2020  5:43 PM  Result Value Ref Range   pH, Ven 7.31 7.25 - 7.43   pCO2, Ven 30 (L) 44 - 60 mmHg  pO2, Ven PENDING 32 - 45 mmHg   Bicarbonate 15.1 (L) 20.0 - 28.0 mmol/L   Acid-base deficit 10.0 (H) 0.0 - 2.0 mmol/L   O2 Saturation 10.6 %   Patient temperature 37.0    Collection site VENOUS    Sample type VENOUS     Comment: Performed at [facility], [address] [place], [place] [address]  Comprehensive metabolic panel     Status: Abnormal   Collection Time: 03/07/2020  5:55 PM  Result Value Ref Range   Sodium 134 (L) 135 - 145 mmol/L   Potassium 4.5 3.5 - 5.1 mmol/L   Chloride 98 98 - 111 mmol/L   CO2 16 (L) 22 - 32 mmol/L   Glucose, Bld 170 (H) 70 - 99 mg/dL    Comment: Glucose reference range applies only to samples taken after fasting for at least 8 hours.   BUN 23 8 - 23 mg/dL   Creatinine, Ser 1.86 (H) 0.61 - 1.24 mg/dL   Calcium 9.0 8.9 - 10.3 mg/dL   Total Protein 9.6 (H) 6.5 - 8.1 g/dL   Albumin 3.0 (L) 3.5 - 5.0 g/dL   AST 46 (H) 15 - 41 U/L   ALT 48 (H) 0 - 44 U/L    Comment: RESULT CONFIRMED BY MANUAL DILUTION / JAG   Alkaline Phosphatase 108 38 - 126 U/L   Total Bilirubin  1.4 (H) 0.3 - 1.2 mg/dL   GFR, Estimated 37 (L) >60 mL/min   Anion gap 20 (H) 5 - 15    Comment: Performed at [facility], [address], [place], [place] [address]  Ethanol     Status: None   Collection Time: 03/18/2020  5:55 PM  Result Value Ref Range   Alcohol, Ethyl (B) <10 <10 mg/dL    Comment: (NOTE) Lowest detectable limit for serum alcohol is 10 mg/dL.  For medical purposes only. Performed at [facility], [address] [place], [place] [address]   Acetaminophen level     Status: Abnormal   Collection Time: 03/06/2020  5:55 PM  Result Value Ref Range   Acetaminophen (Tylenol), Serum <10 (L) 10 - 30 ug/mL    Comment: (NOTE) Therapeutic concentrations vary significantly. A range of 10-30 ug/mL  may be an effective concentration for many patients. However, some  are best treated at concentrations outside of this range. Acetaminophen concentrations >150 ug/mL at 4 hours after ingestion  and >50 ug/mL at 12 hours after ingestion are often associated with  toxic reactions.  Performed at [facility], [address] [place], [place] [address]   Salicylate level     Status: Abnormal   Collection Time: 03/16/2020  5:55 PM  Result Value Ref Range   Salicylate Lvl <2.7 (L) 7.0 - 30.0 mg/dL    Comment: Performed at [facility], [address] [place], [place] [address]  Ammonia     Status: Abnormal   Collection Time: 03/21/2020  5:55 PM  Result Value Ref Range   Ammonia 50 (H) 9 - 35 umol/L    Comment: Performed at [facility], [address] [place], [place] [address]  Lactic acid, plasma     Status: Abnormal   Collection Time: 03/26/2020  5:55 PM  Result Value Ref Range   Lactic Acid, Venous 11.0 (HH) 0.5 - 1.9 mmol/L    Comment: CRITICAL RESULT CALLED TO, READ BACK BY AND VERIFIED WITH [place] HAHLE ON 03/30/2020 AT 1848 BY JAG Performed at [facility], [place], [place] [address]   Troponin I  (High Sensitivity)     Status: Abnormal   Collection Time: 03/26/2020  5:55 PM  Result Value Ref Range   Troponin I (High Sensitivity) 96 (H) <18 ng/L    Comment: (NOTE) Elevated high sensitivity troponin I (hsTnI) values and significant  changes across serial measurements may suggest ACS but many other  chronic and acute conditions are known to elevate hsTnI results.  Refer to the "Links" section for chest pain algorithms and additional  guidance. Performed at [facility], [address] [place], [place] [address]   Protime-INR     Status: Abnormal   Collection Time: 03/08/2020  5:55 PM  Result Value Ref Range   Prothrombin Time 19.9 (H) 11.4 - 15.2 seconds   INR 1.8 (H) 0.8 - 1.2    Comment: (NOTE) INR goal varies based on device and disease states. Performed at [facility], [address] [place], [place] [address]   APTT     Status: None   Collection Time: 03/14/2020  5:55 PM  Result Value Ref Range   aPTT 31 24 - 36 seconds    Comment: Performed at [facility], [address] [place], [place] [address]  Blood Culture (routine x 2)     Status: None (Preliminary result)   Collection Time: 03/07/2020  5:55 PM   Specimen: BLOOD  Result Value Ref Range   Specimen Description      BLOOD RIGHT ANTECUBITAL Performed at [facility], [address], [place], [place] [address]    Special Requests      BOTTLES DRAWN AEROBIC AND ANAEROBIC Blood Culture results may not be optimal due to an excessive volume of blood received in culture bottles Performed at [facility], [address] [place], [place] [address]    Culture  Setup Time IN BOTH AEROBIC AND ANAEROBIC BOTTLES    Culture PENDING    Report Status PENDING   Blood Culture (routine x 2)     Status: None (Preliminary result)   Collection Time: 03/18/2020  5:55 PM   Specimen: BLOOD  Result Value Ref Range   Specimen Description BLOOD LEFT ANTECUBITAL    Special Requests       BOTTLES DRAWN AEROBIC AND ANAEROBIC Blood Culture results may not be optimal due to an excessive volume of blood received in culture bottles   Culture  Setup Time      NO ORGANISMS SEEN ANAEROBIC BOTTLE ONLY Organism ID to follow [place] CALLED TO, READ BACK BY AND VERIFIED WITH: ALEX CHAPPELL AT 1017 0/9/21 SDR Performed at [facility], [address] [place],  [address]    Culture GRAM POSITIVE COCCI    Report Status PENDING   Procalcitonin - Baseline     Status: None   Collection Time: 03/31/2020  5:55 PM  Result Value Ref Range   Procalcitonin <0.10 ng/mL    Comment:        Interpretation: PCT (Procalcitonin) <= 0.5 ng/mL: Systemic infection (sepsis) is not likely. Local bacterial infection is possible. (NOTE)       Sepsis PCT Algorithm           Lower Respiratory Tract  Infection PCT Algorithm    ----------------------------     ----------------------------         PCT < 0.25 ng/mL                PCT < 0.10 ng/mL          Strongly encourage             Strongly discourage   discontinuation of antibiotics    initiation of antibiotics    ----------------------------     -----------------------------       PCT 0.25 - 0.50 ng/mL            PCT 0.10 - 0.25 ng/mL               OR       >80% decrease in PCT            Discourage initiation of                                            antibiotics      Encourage discontinuation           of antibiotics    ----------------------------     -----------------------------         PCT >= 0.50 ng/mL              PCT 0.26 - 0.50 ng/mL               AND        <80% decrease in PCT             Encourage initiation of                                             antibiotics       Encourage continuation           of antibiotics    ----------------------------     -----------------------------        PCT >= 0.50 ng/mL                  PCT  > 0.50 ng/mL               AND         increase in PCT                  Strongly encourage                                      initiation of antibiotics    Strongly encourage escalation           of antibiotics                                     -----------------------------                                           PCT <= 0.25 ng/mL  OR                                        > 80% decrease in PCT                                      Discontinue / Do not initiate                                             antibiotics  Performed at [facility], [address] [place], [place] [address]   TSH     Status: None   Collection Time: 03/10/2020  5:55 PM  Result Value Ref Range   TSH 1.952 0.350 - 4.500 uIU/mL    Comment: Performed by a 3rd Generation assay with a functional sensitivity of <=0.01 uIU/mL. Performed at [facility], [address] [place], [place] [address]   Hepatitis panel, acute     Status: None   Collection Time: 03/26/2020  5:55 PM  Result Value Ref Range   Hepatitis B Surface Ag NON REACTIVE NON REACTIVE   HCV Ab NON REACTIVE NON REACTIVE    Comment: (NOTE) Nonreactive HCV antibody screen is consistent with no HCV infections,  unless recent infection is suspected or other evidence exists to indicate HCV infection.     Hep A IgM NON REACTIVE NON REACTIVE   Hep B C IgM NON REACTIVE NON REACTIVE    Comment: Performed at [facility], [place] [address] [place], [place] [address]  Heparin level (unfractionated)     Status: Abnormal   Collection Time: 03/28/2020  5:55 PM  Result Value Ref Range   Heparin Unfractionated 2.38 (H) 0.30 - 0.70 IU/mL    Comment: RESULTS CONFIRMED BY MANUAL DILUTION Performed at [facility], [address] [place], [place] [address]   Blood Culture ID Panel (Reflexed)     Status: Abnormal   Collection Time: 04/01/2020  5:55 PM  Result Value Ref Range    Enterococcus faecalis NOT DETECTED NOT DETECTED   Enterococcus Faecium NOT DETECTED NOT DETECTED   Listeria monocytogenes NOT DETECTED NOT DETECTED   Staphylococcus species DETECTED (A) NOT DETECTED    Comment: CRITICAL RESULT CALLED TO, READ BACK BY AND VERIFIED WITH:  ALEX CHAPPELL AT 2119 03/10/20 SDR    Staphylococcus aureus (BCID) DETECTED (A) NOT DETECTED    Comment: Methicillin (oxacillin)-resistant Staphylococcus aureus (MRSA). MRSA is predictably resistant to beta-lactam antibiotics (except ceftaroline). Preferred therapy is vancomycin unless clinically contraindicated. Patient requires contact precautions if  hospitalized. CRITICAL RESULT CALLED TO, READ BACK BY AND VERIFIED WITH:  ALEX CHAPPELL AT 1017 03/10/20 SDR    Staphylococcus epidermidis NOT DETECTED NOT DETECTED   Staphylococcus lugdunensis NOT DETECTED NOT DETECTED   Streptococcus species NOT DETECTED NOT DETECTED   Streptococcus agalactiae NOT DETECTED NOT DETECTED   Streptococcus pneumoniae NOT DETECTED NOT DETECTED   Streptococcus pyogenes NOT DETECTED NOT DETECTED   A.calcoaceticus-baumannii NOT DETECTED NOT DETECTED   Bacteroides fragilis NOT DETECTED NOT DETECTED   Enterobacterales NOT DETECTED NOT DETECTED   Enterobacter cloacae complex NOT DETECTED NOT DETECTED   Escherichia coli NOT DETECTED NOT DETECTED   Klebsiella aerogenes NOT DETECTED  NOT DETECTED   Klebsiella oxytoca NOT DETECTED NOT DETECTED   Klebsiella pneumoniae NOT DETECTED NOT DETECTED   Proteus species NOT DETECTED NOT DETECTED   Salmonella species NOT DETECTED NOT DETECTED   Serratia marcescens NOT DETECTED NOT DETECTED   Haemophilus influenzae NOT DETECTED NOT DETECTED   Neisseria meningitidis NOT DETECTED NOT DETECTED   Pseudomonas aeruginosa NOT DETECTED NOT DETECTED   Stenotrophomonas maltophilia NOT DETECTED NOT DETECTED   Candida albicans NOT DETECTED NOT DETECTED   Candida auris NOT DETECTED NOT DETECTED   Candida glabrata NOT  DETECTED NOT DETECTED   Candida krusei NOT DETECTED NOT DETECTED   Candida parapsilosis NOT DETECTED NOT DETECTED   Candida tropicalis NOT DETECTED NOT DETECTED   Cryptococcus neoformans/gattii NOT DETECTED NOT DETECTED   Meth resistant mecA/C and MREJ DETECTED (A) NOT DETECTED    Comment: CRITICAL RESULT CALLED TO, READ BACK BY AND VERIFIED WITH:  ALEX CHAPPELL AT 1017 03/10/20 SDR Performed at [facility], [address] [place], [place] [address]   Respiratory Panel by RT PCR (Flu A&B, Covid) - Nasopharyngeal Swab     Status: None   Collection Time: 03/24/2020  6:21 PM   Specimen: Nasopharyngeal Swab  Result Value Ref Range   SARS Coronavirus 2 by RT PCR NEGATIVE NEGATIVE    Comment: (NOTE) SARS-CoV-2 target nucleic acids are NOT DETECTED.  The SARS-CoV-2 RNA is generally detectable in upper respiratoy specimens during the acute phase of infection. The lowest concentration of SARS-CoV-2 viral copies this assay can detect is 131 copies/mL. A negative result does not preclude SARS-Cov-2 infection and should not be used as the sole basis for treatment or other patient management decisions. A negative result may occur with  improper specimen collection/handling, submission of specimen other than nasopharyngeal swab, presence of viral mutation(s) within the areas targeted by this assay, and inadequate number of viral copies (<131 copies/mL). A negative result must be combined with clinical observations, patient history, and epidemiological information. The expected result is Negative.  Fact Sheet for Patients:  PinkCheek.be  Fact Sheet for Healthcare Providers:  GravelBags.it  This test is no t yet approved or cleared by the [place] FDA and  has been authorized for detection and/or diagnosis of SARS-CoV-2 by FDA under an Emergency Use Authorization (EUA). This EUA will remain  in effect (meaning this test  can be used) for the duration of the COVID-19 declaration under Section 564(b)(1) of the Act, 21 U.S.C. section 360bbb-3(b)(1), unless the authorization is terminated or revoked sooner.     Influenza A by PCR NEGATIVE NEGATIVE   Influenza B by PCR NEGATIVE NEGATIVE    Comment: (NOTE) The Xpert Xpress SARS-CoV-2/FLU/RSV assay is intended as an aid in  the diagnosis of influenza from Nasopharyngeal swab specimens and  should not be used as a sole basis for treatment. Nasal washings and  aspirates are unacceptable for Xpert Xpress SARS-CoV-2/FLU/RSV  testing.  Fact Sheet for Patients: PinkCheek.be  Fact Sheet for Healthcare Providers: GravelBags.it  This test is not yet approved or cleared by the [place] FDA and  has been authorized for detection and/or diagnosis of SARS-CoV-2 by  FDA under an Emergency Use Authorization (EUA). This EUA will remain  in effect (meaning this test can be used) for the duration of the  Covid-19 declaration under Section 564(b)(1) of the Act, 21  U.S.C. section 360bbb-3(b)(1), unless the authorization is  terminated or revoked. Performed at [facility], [address] [place], [place] [address]  CBC     Status: Abnormal   Collection Time: 03/08/2020  6:34 PM  Result Value Ref Range   WBC 21.7 (H) 4.0 - 10.5 K/uL   RBC 3.24 (L) 4.22 - 5.81 MIL/uL   Hemoglobin 8.3 (L) 13.0 - 17.0 g/dL   HCT 27.7 (L) 39 - 52 %   MCV 85.5 80.0 - 100.0 fL   MCH 25.6 (L) 26.0 - 34.0 pg   MCHC 30.0 30.0 - 36.0 g/dL   RDW 17.8 (H) 11.5 - 15.5 %   Platelets 284 150 - 400 K/uL    Comment: PLATELET COUNT PERFORMED ON CITRATED BLOOD Immature Platelet Fraction may be clinically indicated, consider ordering this additional test [id number]    nRBC 0.0 0.0 - 0.2 %    Comment: Performed at [facility], [address] [place], [place] [address]  Lactic acid, plasma     Status: Abnormal    Collection Time: 03/27/2020  8:00 PM  Result Value Ref Range   Lactic Acid, Venous 2.9 (HH) 0.5 - 1.9 mmol/L    Comment: CRITICAL VALUE NOTED. VALUE IS CONSISTENT WITH PREVIOUSLY REPORTED/CALLED VALUE / JAG Performed at [facility], [address] [place], [place] [address]   Troponin I (High Sensitivity)     Status: Abnormal   Collection Time: 03/26/2020  8:00 PM  Result Value Ref Range   Troponin I (High Sensitivity) 128 (HH) <18 ng/L    Comment: CRITICAL RESULT CALLED TO, READ BACK BY AND VERIFIED WITH [place] HAHLE ON 03/16/2020 AT 2108 BY JAG (NOTE) Elevated high sensitivity troponin I (hsTnI) values and significant  changes across serial measurements may suggest ACS but many other  chronic and acute conditions are known to elevate hsTnI results.  Refer to the "Links" section for chest pain algorithms and additional  guidance. Performed at [facility], [address] [place], [place] [address]   Urinalysis, Complete w Microscopic Urine, Catheterized     Status: Abnormal   Collection Time: 03/02/2020  9:06 PM  Result Value Ref Range   Color, Urine YELLOW (A) YELLOW   APPearance HAZY (A) CLEAR   Specific Gravity, Urine 1.022 1.005 - 1.030   pH 5.0 5.0 - 8.0   Glucose, UA >=500 (A) NEGATIVE mg/dL   Hgb urine dipstick NEGATIVE NEGATIVE   Bilirubin Urine NEGATIVE NEGATIVE   Ketones, ur 5 (A) NEGATIVE mg/dL   Protein, ur 30 (A) NEGATIVE mg/dL   Nitrite NEGATIVE NEGATIVE   Leukocytes,Ua NEGATIVE NEGATIVE   RBC / HPF 0-5 0 - 5 RBC/hpf   WBC, UA 0-5 0 - 5 WBC/hpf   Bacteria, UA RARE (A) NONE SEEN   Squamous Epithelial / LPF 0-5 0 - 5    Comment: Performed at [facility], [address], [place], [place] [address]  Urine Drug Screen, Qualitative ([facility] only)     Status: None   Collection Time: 03/18/2020  9:06 PM  Result Value Ref Range   Tricyclic, Ur Screen NONE DETECTED NONE DETECTED   Amphetamines, Ur Screen NONE DETECTED NONE DETECTED   MDMA  (Ecstasy)Ur Screen NONE DETECTED NONE DETECTED   Cocaine Metabolite,Ur Siren NONE DETECTED NONE DETECTED   Opiate, Ur Screen NONE DETECTED NONE DETECTED   Phencyclidine (PCP) Ur S NONE DETECTED NONE DETECTED   Cannabinoid 50 Ng, Ur  NONE DETECTED NONE DETECTED   Barbiturates, Ur Screen NONE DETECTED NONE DETECTED   Benzodiazepine, Ur Scrn NONE DETECTED NONE DETECTED   Methadone Scn, Ur NONE DETECTED NONE DETECTED  Comment: (NOTE) Tricyclics + metabolites, urine    Cutoff 1000 ng/mL Amphetamines + metabolites, urine  Cutoff 1000 ng/mL MDMA (Ecstasy), urine              Cutoff 500 ng/mL Cocaine Metabolite, urine          Cutoff 300 ng/mL Opiate + metabolites, urine        Cutoff 300 ng/mL Phencyclidine (PCP), urine         Cutoff 25 ng/mL Cannabinoid, urine                 Cutoff 50 ng/mL Barbiturates + metabolites, urine  Cutoff 200 ng/mL Benzodiazepine, urine              Cutoff 200 ng/mL Methadone, urine                   Cutoff 300 ng/mL  The urine drug screen provides only a preliminary, unconfirmed analytical test result and should not be used for non-medical purposes. Clinical consideration and professional judgment should be applied to any positive drug screen result due to possible interfering substances. A more specific alternate chemical method must be used in order to obtain a confirmed analytical result. Gas chromatography / mass spectrometry (GC/MS) is the preferred confirm atory method. Performed at [facility], [address] [place], [place] [address]   Troponin I (High Sensitivity)     Status: Abnormal   Collection Time: 03/08/2020 11:41 PM  Result Value Ref Range   Troponin I (High Sensitivity) 203 (HH) <18 ng/L    Comment: CRITICAL VALUE NOTED. VALUE IS CONSISTENT WITH PREVIOUSLY REPORTED/CALLED VALUE / JAG (NOTE) Elevated high sensitivity troponin I (hsTnI) values and significant  changes across serial measurements may suggest ACS but many other   chronic and acute conditions are known to elevate hsTnI results.  Refer to the "Links" section for chest pain algorithms and additional  guidance. Performed at [facility], [address] [place], [place] [address]   Glucose, capillary     Status: Abnormal   Collection Time: 03/10/20 12:00 AM  Result Value Ref Range   Glucose-Capillary 191 (H) 70 - 99 mg/dL    Comment: Glucose reference range applies only to samples taken after fasting for at least 8 hours.  Troponin I (High Sensitivity)     Status: Abnormal   Collection Time: 03/10/20  1:46 AM  Result Value Ref Range   Troponin I (High Sensitivity) 229 (HH) <18 ng/L    Comment: CRITICAL VALUE NOTED. VALUE IS CONSISTENT WITH PREVIOUSLY REPORTED/CALLED VALUE / JAG (NOTE) Elevated high sensitivity troponin I (hsTnI) values and significant  changes across serial measurements may suggest ACS but many other  chronic and acute conditions are known to elevate hsTnI results.  Refer to the "Links" section for chest pain algorithms and additional  guidance. Performed at [facility] [address] [place], [place] [address]   Protime-INR     Status: Abnormal   Collection Time: 03/10/20  5:00 AM  Result Value Ref Range   Prothrombin Time 27.8 (H) 11.4 - 15.2 seconds   INR 2.7 (H) 0.8 - 1.2    Comment: (NOTE) INR goal varies based on device and disease states. Performed at [facility], [address] [place], [place] [address]   Cortisol-am, blood     Status: Abnormal   Collection Time: 03/10/20  5:00 AM  Result Value Ref Range   Cortisol - AM 50.3 (H) 6.7 - 22.6 ug/dL  Comment: Performed at [facility], [address], [place], [place] [address]  Comprehensive metabolic panel     Status: Abnormal   Collection Time: 03/10/20  5:00 AM  Result Value Ref Range   Sodium 133 (L) 135 - 145 mmol/L   Potassium 3.7 3.5 - 5.1 mmol/L   Chloride 104 98 - 111 mmol/L   CO2 17 (L) 22 - 32 mmol/L    Glucose, Bld 172 (H) 70 - 99 mg/dL    Comment: Glucose reference range applies only to samples taken after fasting for at least 8 hours.   BUN 25 (H) 8 - 23 mg/dL   Creatinine, Ser 1.62 (H) 0.61 - 1.24 mg/dL   Calcium 7.8 (L) 8.9 - 10.3 mg/dL   Total Protein 7.9 6.5 - 8.1 g/dL   Albumin 2.5 (L) 3.5 - 5.0 g/dL   AST 228 (H) 15 - 41 U/L   ALT 153 (H) 0 - 44 U/L   Alkaline Phosphatase 90 38 - 126 U/L   Total Bilirubin 1.4 (H) 0.3 - 1.2 mg/dL   GFR, Estimated 43 (L) >60 mL/min   Anion gap 12 5 - 15    Comment: Performed at [facility], [address] [place], [place] [address]  CBC     Status: Abnormal   Collection Time: 03/10/20  5:00 AM  Result Value Ref Range   WBC 27.6 (H) 4.0 - 10.5 K/uL   RBC 3.04 (L) 4.22 - 5.81 MIL/uL   Hemoglobin 7.8 (L) 13.0 - 17.0 g/dL   HCT 25.6 (L) 39 - 52 %   MCV 84.2 80.0 - 100.0 fL   MCH 25.7 (L) 26.0 - 34.0 pg   MCHC 30.5 30.0 - 36.0 g/dL   RDW 18.0 (H) 11.5 - 15.5 %   Platelets 186 150 - 400 K/uL    Comment: PLATELET COUNT PERFORMED ON CITRATED BLOOD Immature Platelet Fraction may be clinically indicated, consider ordering this additional test [id number]    nRBC 0.0 0.0 - 0.2 %    Comment: Performed at [facility], [address] [place], [place] [address]  Glucose, capillary     Status: Abnormal   Collection Time: 03/10/20  5:04 AM  Result Value Ref Range   Glucose-Capillary 172 (H) 70 - 99 mg/dL    Comment: Glucose reference range applies only to samples taken after fasting for at least 8 hours.  APTT     Status: Abnormal   Collection Time: 03/10/20  9:14 AM  Result Value Ref Range   aPTT 86 (H) 24 - 36 seconds    Comment:        IF BASELINE aPTT IS ELEVATED, SUGGEST PATIENT RISK ASSESSMENT BE USED TO DETERMINE APPROPRIATE ANTICOAGULANT THERAPY. Performed at [facility], [place], [place] [address]   Heparin level (unfractionated)     Status: Abnormal   Collection Time: 03/10/20  9:14  AM  Result Value Ref Range   Heparin Unfractionated 1.73 (H) 0.30 - 0.70 IU/mL    Comment: (NOTE) If heparin results are below expected values, and patient dosage has  been confirmed, suggest follow up testing of antithrombin III levels. Performed at [facility], [address] [place], [place] [address]    CT ABDOMEN PELVIS WO CONTRAST  Result Date: 03/10/2020 CLINICAL DATA:  Abnormal LFTs, possible sepsis EXAM: CT ABDOMEN AND PELVIS WITHOUT CONTRAST TECHNIQUE: Multidetector CT imaging of the abdomen and pelvis was performed following the standard protocol without IV contrast.  COMPARISON:  CT 04/08/2019 FINDINGS: Motion degraded imaging of the lower chest and upper abdomen. Imaging quality further degraded due to increased image noise from the patient's arms over the upper abdomen. Lower chest: Small bilateral effusions. Adjacent atelectatic changes are present. Some additional heterogenous opacity could reflect further atelectasis at, edema or early consolidation. Evidence of prior sternotomy and CABG. Extensive calcification of the native coronary arteries. Pacer lead terminates at the cardiac apex. No pericardial effusion. Hepatobiliary: No visible focal liver lesion on this unenhanced CT. Some mild perihepatic hazy stranding may be present though this is possibly accentuated by extensive motion artifact and streak in the upper abdomen. No focal liver lesion is seen. High attenuation material seen within the gallbladder lumen, possibly biliary sand versus vicarious excretion of contrast though should correlate for recent contrast enhanced studies, none available at the time of interpretation. No focal pericholecystic inflammation is seen. No biliary ductal dilatation or visible intraductal gallstones. Pancreas: Unremarkable. No pancreatic ductal dilatation or surrounding inflammatory changes. Spleen: Normal in size. No concerning splenic lesions. Adrenals/Urinary Tract: Normal adrenal  glands. Extensive bilateral perinephric stranding is increased from comparison imaging. No discernible focal renal lesion. Renal vascular calcifications. No urolithiasis or hydronephrosis. Circumferential bladder wall thickening is present with some focal thickening towards the anterosuperior bladder dome, possibly reactive. Stomach/Bowel: Small hiatal hernia. Stomach mildly distended with air and ingested fluid. No significant small bowel thickening or dilatation. Diffuse pancolonic edematous mural thickening with faint hazy pericolonic stranding. No evidence of obstruction. High attenuation fecal material, could reflect ingested bismuth containing products, inspissated stool or previously administered enteric contrast media. Vascular/Lymphatic: Atherosclerotic calcifications within the abdominal aorta and branch vessels. No aneurysm or ectasia. No enlarged abdominopelvic lymph nodes. Some edematous periaortic nodes are present. No pathologically enlarged abdominopelvic lymph nodes. Reproductive: Mild prostatomegaly. Seminal vesicles are unremarkable. Other: Diffuse body wall edema. Edematous changes in the central mesentery. Trace free fluid in the pelvis. No free air. No organized collection or abscess. Tiny fat containing right inguinal hernia. No bowel containing hernias. Musculoskeletal: Multilevel degenerative changes are present in the imaged portions of the spine. Additional moderate degenerative changes in the hips as well as fusion across the bilateral SI joints. Prior sternotomy. Prior right femoral intramedullary nail and transcervical pin. No acute osseous abnormality or suspicious osseous lesion. IMPRESSION: 1. Diffuse pancolonic edematous mural thickening with faint hazy pericolonic stranding, consistent with a nonspecific colitis of infectious or inflammatory etiology. 2. Extensive bilateral perinephric stranding is increased from comparison imaging. This is nonspecific but can be seen in the  setting of ascending urinary tract infection given the circumferential bladder wall thickening as well. Recommend correlation with urinalysis. 3. Small bilateral effusions with adjacent atelectatic changes. Some additional heterogenous opacity could reflect further atelectasis, edema or early consolidation. 4. High attenuation material within the gallbladder lumen, possibly biliary sand versus vicarious excretion of contrast though no recent contrast enhanced study is seen in the patient's chart. Correlate with history. Could consider right upper quadrant ultrasound. 5. Some hazy stranding about the liver, nonspecific and possibly accentuated by motion artifact and image noise though can be seen in the setting of hepatitis, given elevated LFTs. 6. Additional features of anasarca with body wall edema. 7. Aortic Atherosclerosis (ICD10-I70.0). Electronically Signed   By: Lovena Le M.D.   On: 03/21/2020 21:44   CT Head Wo Contrast  Result Date: 03/26/2020 CLINICAL DATA:  69 year old male with altered mental status. EXAM: CT HEAD WITHOUT CONTRAST TECHNIQUE: Contiguous axial images were obtained from the  base of the skull through the vertex without intravenous contrast. COMPARISON:  Head CT dated 01/23/2020. FINDINGS: Brain: There is mild age-related atrophy and chronic microvascular ischemic changes. 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 There is no acute intracranial hemorrhage. No mass effect or midline shift. No extra-axial fluid collection. Vascular: No hyperdense vessel or unexpected calcification. Skull: Normal. Negative for fracture or focal lesion. Sinuses/Orbits: No acute finding. Other: None IMPRESSION: 1. No acute intracranial hemorrhage. 2. Mild age-related atrophy and chronic microvascular ischemic changes. 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 Electronically Signed   By: Anner Crete M.D.   On: 03/08/2020 18:03    DG Chest Port 1 View  Result Date: 03/18/2020 CLINICAL DATA:  Possible sepsis EXAM: PORTABLE CHEST 1 VIEW COMPARISON:  Radiograph 01/17/2020 FINDINGS: Mild airways thickening with some increasing interstitial and patchy opacities in lung bases and retrocardiac space. Background of chronically coarsened interstitial features. No pneumothorax or visible effusion. Pacer/AICD overlies the left chest wall. Post sternotomy and CABG changes likely with abandoned epicardial pacer leads in the upper abdomen. Chronic cardiomegaly with a calcified aorta, not significantly changed accounting for differences in technique. The osseous structures appear diffusely demineralized which may limit detection of small or nondisplaced fractures. No acute osseous or soft tissue abnormality. Moderate to severe degenerative changes are present in the imaged spine and shoulders. IMPRESSION: 1. Mild airways thickening with some increasing interstitial and patchy opacities in the lung bases and retrocardiac space. Could reflect developing infection or edema. 2. Chronic interstitial changes. 3. Cardiomegaly similar to prior. Prior sternotomy and CABG. 4.  Aortic Atherosclerosis (ICD10-I70.0). Electronically Signed   By: Lovena Le M.D.   On: 03/02/2020 18:20    Blood pressure 128/72, pulse 82, temperature 98.9 F (37.2 C), temperature source Oral, resp. rate 18, weight 79.4 kg, SpO2 99 %.   Assessment 1. Bacteremia/sepsis of unknown origin 2. 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 Pressure ulceration of the posterior aspect left heel, stable 4. Small pinpoint ulceration to the distal tip of the left second toe, stable 5. Uncontrolled diabetes type 2 polyneuropathy 6. PVD  Plan -Patient seen and examined. -X-ray ordered.  X-ray was taken in clinic showing stable chronic osteomyelitis of the first metatarsal head distally.  Numerous calcifications noted of  arteries. -Patient has recently been seen by vascular and no further revascularization was planned on 03/06/2020.  Vascular team has been consulted by internist for further evaluation and management. -Wound does probe to bone to the first metatarsal, likely chronic stable osteomyelitis present.  No erythema, no edema, minimal serous drainage, no purulence, no odor. -Unable to undergo MRI due to pacemaker. -Wound culture taken today and sent off.  Appreciate antibiotic recommendations per infectious disease.  Discussed case with Dr. Steva Ready in detail. -Applied Betadine wet-to-dry dressing.  Perform daily until surgical procedure. -Discussed all treatment options with the patient both conservative and surgical attempts at correction include potential risks and complications.  At this time patient has elected for procedure consisting of left partial first ray amputation to potentially get rid of 1 source of infection which could be dechronic stable osteomyelitis present.  Discussed with hospitalist who recommended performing procedure tomorrow and patient more stable. -Message left for daughter Nira Conn on plans for surgical intervention and patient status. -Patient to be n.p.o. at midnight for procedure which will likely take place around 9 AM on 04/01/2020.  Caroline More, D.P.M.  03/10/2020, 12:46 PM

## 2020-03-10 NOTE — Progress Notes (Signed)
Patient received from ED about 1615 with lethargy and confusion. Patient is now presenting with increased anxiety and agitaion, very confused and idfficult to redirect. Trying to get OOB without help.  His VSS B/p 116/65 HR 84. He is pending SLP eval and NPO. MRSA PCR +. MD made aware for recommendations. Bed alarm on functioning with out difficulties noted. RN spoke to patient's daughter and updated. Patient does have a GPS monitoring device on the right ankle. Per daughter, patient's parole officer is aware that patient is in the hospital. Will continue to monitor.

## 2020-03-10 NOTE — ED Notes (Signed)
Stage 1 pressure injury on R inner buttock.

## 2020-03-11 ENCOUNTER — Inpatient Hospital Stay: Payer: No Typology Code available for payment source | Admitting: Anesthesiology

## 2020-03-11 ENCOUNTER — Inpatient Hospital Stay: Payer: No Typology Code available for payment source

## 2020-03-11 ENCOUNTER — Encounter: Admission: EM | Disposition: E | Payer: Self-pay | Source: Home / Self Care | Attending: Internal Medicine

## 2020-03-11 DIAGNOSIS — R6521 Severe sepsis with septic shock: Secondary | ICD-10-CM | POA: Diagnosis not present

## 2020-03-11 DIAGNOSIS — A419 Sepsis, unspecified organism: Secondary | ICD-10-CM | POA: Diagnosis not present

## 2020-03-11 DIAGNOSIS — M8660 Other chronic osteomyelitis, unspecified site: Secondary | ICD-10-CM

## 2020-03-11 DIAGNOSIS — R7881 Bacteremia: Secondary | ICD-10-CM | POA: Diagnosis not present

## 2020-03-11 DIAGNOSIS — A4102 Sepsis due to Methicillin resistant Staphylococcus aureus: Principal | ICD-10-CM

## 2020-03-11 DIAGNOSIS — D696 Thrombocytopenia, unspecified: Secondary | ICD-10-CM

## 2020-03-11 DIAGNOSIS — L97529 Non-pressure chronic ulcer of other part of left foot with unspecified severity: Secondary | ICD-10-CM

## 2020-03-11 DIAGNOSIS — E8809 Other disorders of plasma-protein metabolism, not elsewhere classified: Secondary | ICD-10-CM

## 2020-03-11 DIAGNOSIS — R601 Generalized edema: Secondary | ICD-10-CM

## 2020-03-11 DIAGNOSIS — R6520 Severe sepsis without septic shock: Secondary | ICD-10-CM | POA: Diagnosis not present

## 2020-03-11 DIAGNOSIS — Z950 Presence of cardiac pacemaker: Secondary | ICD-10-CM

## 2020-03-11 DIAGNOSIS — I7025 Atherosclerosis of native arteries of other extremities with ulceration: Secondary | ICD-10-CM

## 2020-03-11 DIAGNOSIS — D649 Anemia, unspecified: Secondary | ICD-10-CM

## 2020-03-11 HISTORY — PX: AMPUTATION: SHX166

## 2020-03-11 LAB — CBC
HCT: 27 % — ABNORMAL LOW (ref 39.0–52.0)
Hemoglobin: 8.2 g/dL — ABNORMAL LOW (ref 13.0–17.0)
MCH: 25.6 pg — ABNORMAL LOW (ref 26.0–34.0)
MCHC: 30.4 g/dL (ref 30.0–36.0)
MCV: 84.4 fL (ref 80.0–100.0)
Platelets: 129 10*3/uL — ABNORMAL LOW (ref 150–400)
RBC: 3.2 MIL/uL — ABNORMAL LOW (ref 4.22–5.81)
RDW: 17.9 % — ABNORMAL HIGH (ref 11.5–15.5)
WBC: 22.6 10*3/uL — ABNORMAL HIGH (ref 4.0–10.5)
nRBC: 0 % (ref 0.0–0.2)

## 2020-03-11 LAB — HEPATITIS PANEL, ACUTE
HCV Ab: NONREACTIVE
Hep A IgM: NONREACTIVE
Hep B C IgM: NONREACTIVE
Hepatitis B Surface Ag: NONREACTIVE

## 2020-03-11 LAB — GLUCOSE, CAPILLARY
Glucose-Capillary: 162 mg/dL — ABNORMAL HIGH (ref 70–99)
Glucose-Capillary: 146 mg/dL — ABNORMAL HIGH (ref 70–99)
Glucose-Capillary: 145 mg/dL — ABNORMAL HIGH (ref 70–99)
Glucose-Capillary: 172 mg/dL — ABNORMAL HIGH (ref 70–99)
Glucose-Capillary: 162 mg/dL — ABNORMAL HIGH (ref 70–99)
Glucose-Capillary: 174 mg/dL — ABNORMAL HIGH (ref 70–99)

## 2020-03-11 LAB — COMPREHENSIVE METABOLIC PANEL
ALT: 1641 U/L — ABNORMAL HIGH (ref 0–44)
AST: 2306 U/L — ABNORMAL HIGH (ref 15–41)
Albumin: 2.6 g/dL — ABNORMAL LOW (ref 3.5–5.0)
Alkaline Phosphatase: 92 U/L (ref 38–126)
Anion gap: 10 (ref 5–15)
BUN: 36 mg/dL — ABNORMAL HIGH (ref 8–23)
CO2: 20 mmol/L — ABNORMAL LOW (ref 22–32)
Calcium: 7.6 mg/dL — ABNORMAL LOW (ref 8.9–10.3)
Chloride: 105 mmol/L (ref 98–111)
Creatinine, Ser: 1.68 mg/dL — ABNORMAL HIGH (ref 0.61–1.24)
GFR, Estimated: 41 mL/min — ABNORMAL LOW (ref >60)
Glucose, Bld: 174 mg/dL — ABNORMAL HIGH (ref 70–99)
Potassium: 3.8 mmol/L (ref 3.5–5.1)
Sodium: 135 mmol/L (ref 135–145)
Total Bilirubin: 1.7 mg/dL — ABNORMAL HIGH (ref 0.3–1.2)
Total Protein: 8.2 g/dL — ABNORMAL HIGH (ref 6.5–8.1)

## 2020-03-11 LAB — URINE CULTURE: Culture: NO GROWTH

## 2020-03-11 LAB — APTT
aPTT: 124 seconds — ABNORMAL HIGH (ref 24–36)
aPTT: 44 seconds — ABNORMAL HIGH (ref 24–36)

## 2020-03-11 LAB — HEPARIN LEVEL (UNFRACTIONATED): Heparin Unfractionated: 2.54 IU/mL — ABNORMAL HIGH (ref 0.30–0.70)

## 2020-03-11 LAB — PROCALCITONIN: Procalcitonin: 28.61 ng/mL

## 2020-03-11 SURGERY — AMPUTATION, FOOT, RAY
Anesthesia: General | Laterality: Left

## 2020-03-11 MED ORDER — FENTANYL CITRATE (PF) 100 MCG/2ML IJ SOLN
INTRAMUSCULAR | Status: AC
  Filled 2020-03-11: qty 2

## 2020-03-11 MED ORDER — LACTATED RINGERS IV SOLN: INTRAVENOUS | Status: DC | PRN

## 2020-03-11 MED ORDER — PROPOFOL 500 MG/50ML IV EMUL
INTRAVENOUS | Status: AC
  Filled 2020-03-11: qty 50

## 2020-03-11 MED ORDER — FENTANYL CITRATE (PF) 100 MCG/2ML IJ SOLN: 25.0000 ug | INTRAMUSCULAR | Status: DC | PRN

## 2020-03-11 MED ORDER — KETAMINE HCL 10 MG/ML IJ SOLN
INTRAMUSCULAR | Status: DC | PRN
  Administered 2020-03-11: 50 mg via INTRAVENOUS

## 2020-03-11 MED ORDER — LORAZEPAM 2 MG/ML IJ SOLN
1.0000 mg | Freq: Four times a day (QID) | INTRAMUSCULAR | Status: DC | PRN
  Administered 2020-03-11 – 2020-03-13 (×5): 1 mg via INTRAVENOUS
  Filled 2020-03-11 (×7): qty 1

## 2020-03-11 MED ORDER — ONDANSETRON HCL 4 MG/2ML IJ SOLN: 4.0000 mg | Freq: Once | INTRAMUSCULAR | Status: DC | PRN

## 2020-03-11 MED ORDER — PHENYLEPHRINE HCL (PRESSORS) 10 MG/ML IV SOLN
INTRAVENOUS | Status: DC | PRN
  Administered 2020-03-11: 150 ug via INTRAVENOUS
  Administered 2020-03-11: 100 ug via INTRAVENOUS
  Administered 2020-03-11: 150 ug via INTRAVENOUS
  Administered 2020-03-11 (×2): 100 ug via INTRAVENOUS

## 2020-03-11 MED ORDER — BUPIVACAINE HCL 0.5 % IJ SOLN
INTRAMUSCULAR | Status: DC | PRN
  Administered 2020-03-11: 15 mL

## 2020-03-11 MED ORDER — PROPOFOL 500 MG/50ML IV EMUL
INTRAVENOUS | Status: DC | PRN
  Administered 2020-03-11: 100 ug/kg/min via INTRAVENOUS

## 2020-03-11 MED ORDER — NEOMYCIN-POLYMYXIN B GU 40-200000 IR SOLN
Status: DC | PRN
  Administered 2020-03-11: 2 mL

## 2020-03-11 MED ORDER — KETAMINE HCL 50 MG/ML IJ SOLN
INTRAMUSCULAR | Status: AC
  Filled 2020-03-11: qty 10

## 2020-03-11 MED ORDER — FENTANYL CITRATE (PF) 100 MCG/2ML IJ SOLN
INTRAMUSCULAR | Status: DC | PRN
  Administered 2020-03-11: 25 ug via INTRAVENOUS
  Administered 2020-03-11: 50 ug via INTRAVENOUS

## 2020-03-11 MED ORDER — CHLORHEXIDINE GLUCONATE 4 % EX LIQD: 60.0000 mL | Freq: Once | CUTANEOUS | Status: DC

## 2020-03-11 SURGICAL SUPPLY — 49 items
BLADE MED AGGRESSIVE (BLADE) ×3 IMPLANT
BLADE OSC/SAGITTAL MD 5.5X18 (BLADE) ×3 IMPLANT
BLADE SURG 15 STRL LF DISP TIS (BLADE) ×1 IMPLANT
BLADE SURG 15 STRL SS (BLADE) ×3
BLADE SURG MINI STRL (BLADE) ×3 IMPLANT
BNDG CONFORM 2 STRL LF (GAUZE/BANDAGES/DRESSINGS) IMPLANT
BNDG ELASTIC 4X5.8 VLCR STR LF (GAUZE/BANDAGES/DRESSINGS) ×3 IMPLANT
BNDG ESMARK 4X12 TAN STRL LF (GAUZE/BANDAGES/DRESSINGS) ×3 IMPLANT
BNDG GAUZE 4.5X4.1 6PLY STRL (MISCELLANEOUS) ×3 IMPLANT
CANISTER SUCT 1200ML W/VALVE (MISCELLANEOUS) ×3 IMPLANT
CNTNR SPEC 2.5X3XGRAD LEK (MISCELLANEOUS) ×1
CONT SPEC 4OZ STER OR WHT (MISCELLANEOUS) ×2
CONT SPEC 4OZ STRL OR WHT (MISCELLANEOUS) ×1
CONTAINER SPEC 2.5X3XGRAD LEK (MISCELLANEOUS) ×1 IMPLANT
COVER WAND RF STERILE (DRAPES) ×3 IMPLANT
CUFF TOURN DUAL 18 NO SLV REUS (MISCELLANEOUS) IMPLANT
CUFF TOURN DUAL PL 12 NO SLV (MISCELLANEOUS) IMPLANT
CUFF TOURN SGL QUICK 18X4 (TOURNIQUET CUFF) IMPLANT
DRAPE FLUOR MINI C-ARM 54X84 (DRAPES) IMPLANT
DURAPREP 26ML APPLICATOR (WOUND CARE) ×3 IMPLANT
ELECT REM PT RETURN 9FT ADLT (ELECTROSURGICAL) ×3
ELECTRODE REM PT RTRN 9FT ADLT (ELECTROSURGICAL) ×1 IMPLANT
GAUZE SPONGE 4X4 12PLY STRL (GAUZE/BANDAGES/DRESSINGS) ×6 IMPLANT
GAUZE XEROFORM 1X8 LF (GAUZE/BANDAGES/DRESSINGS) ×3 IMPLANT
GLOVE BIO SURGEON STRL SZ7 (GLOVE) ×3 IMPLANT
GLOVE INDICATOR 7.0 STRL GRN (GLOVE) ×3 IMPLANT
GOWN STRL REUS W/ TWL LRG LVL3 (GOWN DISPOSABLE) ×2 IMPLANT
GOWN STRL REUS W/TWL LRG LVL3 (GOWN DISPOSABLE) ×6
HANDPIECE VERSAJET DEBRIDEMENT (MISCELLANEOUS) IMPLANT
KIT TURNOVER KIT A (KITS) ×3 IMPLANT
LABEL OR SOLS (LABEL) ×3 IMPLANT
NEEDLE FILTER BLUNT 18X 1/2SAF (NEEDLE) ×2
NEEDLE FILTER BLUNT 18X1 1/2 (NEEDLE) ×1 IMPLANT
NEEDLE HYPO 25X1 1.5 SAFETY (NEEDLE) ×6 IMPLANT
NS IRRIG 500ML POUR BTL (IV SOLUTION) ×3 IMPLANT
PACK EXTREMITY (MISCELLANEOUS) ×3 IMPLANT
PAD ABD DERMACEA PRESS 5X9 (GAUZE/BANDAGES/DRESSINGS) ×3 IMPLANT
SOL .9 NS 3000ML IRR  AL (IV SOLUTION)
SOL .9 NS 3000ML IRR AL (IV SOLUTION)
SOL .9 NS 3000ML IRR UROMATIC (IV SOLUTION) IMPLANT
SOL PREP PVP 2OZ (MISCELLANEOUS) ×3
SOLUTION PREP PVP 2OZ (MISCELLANEOUS) ×1 IMPLANT
STOCKINETTE STRL 6IN 960660 (GAUZE/BANDAGES/DRESSINGS) ×3 IMPLANT
SUT ETHILON 3-0 FS-10 30 BLK (SUTURE) ×9
SUT VIC AB 3-0 SH 27 (SUTURE) ×3
SUT VIC AB 3-0 SH 27X BRD (SUTURE) ×1 IMPLANT
SUTURE EHLN 3-0 FS-10 30 BLK (SUTURE) ×3 IMPLANT
SWAB CULTURE AMIES ANAERIB BLU (MISCELLANEOUS) ×3 IMPLANT
SYR 10ML LL (SYRINGE) ×3 IMPLANT

## 2020-03-11 NOTE — Transfer of Care (Signed)
Immediate Anesthesia Transfer of Care Note  Patient: Jason Montes  Procedure(s) Performed: LEFT PARTIAL FIRST  RAY AMPUTATION (Left )  Patient Location: PACU  Anesthesia Type:General  Level of Consciousness: drowsy  Airway & Oxygen Therapy: Patient Spontanous Breathing and Patient connected to face mask oxygen  Post-op Assessment: Report given to RN and Post -op Vital signs reviewed and stable  Post vital signs: Reviewed and stable  Last Vitals:  Vitals Value Taken Time  BP 94/58 03/23/2020 1120  Temp 35.9 C 03/28/2020 1120  Pulse 76 03/21/2020 1124  Resp 19 03/07/2020 1124  SpO2 100 % 03/09/2020 1124  Vitals shown include unvalidated device data.  Last Pain:  Vitals:   03/20/2020 1120  TempSrc:   PainSc: Asleep         Complications: No complications documented.

## 2020-03-11 NOTE — Progress Notes (Signed)
[place] called into pt.'s rm. while passing through ICU; pt. calling out loudly intermittently, but pleasant when [place] entered rm. and talked briefly w/him, apparently grateful to see chaplain.  Pt. having trouble putting thoughts/requests into words; asked for a vanilla shake.  RN outside at [employer] station and had just entered OR into system for [facility] to talk to pt. as he has been agitated all evening.  Pt. seemed calmed by visit, but [place] remains available as needed.

## 2020-03-11 NOTE — Progress Notes (Signed)
Jason Montes for Heparin  Indication: chest pain/ACS  Allergies  Allergen Reactions  . Amlodipine Other (See Comments)  . Penicillins     Childhood allergy, not sure what happens  Other reaction(s): Other, Other  . Norvasc [Amlodipine Besylate]     Unknown    Patient Measurements: Height: 5\' 9"  (175.3 cm) Weight: 81.1 kg (178 lb 12.7 oz) IBW/kg (Calculated) : 70.7 Heparin Dosing Weight: 79.4 kg   Vital Signs: Temp: 98.1 F (36.7 C) (10/10 0400) Temp Source: Oral (10/10 0400) BP: 107/61 (10/10 0400) Pulse Rate: 84 (10/10 0400)  Labs: Recent Labs    03/05/2020 1755 03/30/2020 1755 03/02/2020 1834 03/21/2020 1834 03/20/2020 2000 03/06/2020 2341 03/10/20 0146 03/10/20 0500 03/10/20 0914 03/10/20 0914 03/10/20 1519 03/10/20 2132 03/11/20 0444  HGB  --   --  8.3*   < >  --   --   --  7.8*  --   --   --   --  8.2*  HCT  --   --  27.7*  --   --   --   --  25.6*  --   --   --   --  27.0*  PLT  --   --  284  --   --   --   --  186  --   --   --   --  129*  APTT 31   < >  --   --   --   --   --   --  86*   < > 59* 95* 124*  LABPROT 19.9*  --   --   --   --   --   --  27.8*  --   --   --   --   --   INR 1.8*  --   --   --   --   --   --  2.7*  --   --   --   --   --   HEPARINUNFRC 2.38*  --   --   --   --   --   --   --  1.73*  --   --   --   --   CREATININE 1.86*  --   --   --   --   --   --  1.62*  --   --   --   --   --   TROPONINIHS 96*  --   --   --    < > 203* 229*  --   --   --  219*  --   --    < > = values in this interval not displayed.    Estimated Creatinine Clearance: 44.2 mL/min (A) (by C-G formula based on SCr of 1.62 mg/dL (H)).   Medical History: Past Medical History:  Diagnosis Date  . Allergy   . Atrial fibrillation ([place])   . B12 deficiency   . Chronic kidney disease   . Chronic kidney disease (CKD), stage II (mild)   . Coronary atherosclerosis   . Diabetes mellitus without complication ([place])   . Elevated PSA   .  Esophagitis   . GERD (gastroesophageal reflux disease)   . Heart attack ([place])   . Heart disease   . Hyperlipidemia   . Hypertension   . Hypokalemia   . Mild neurocognitive disorder   . Osteoarthritis   . Presbyopia   . PVD (peripheral vascular disease) (  HCC)   . Restless leg syndrome   . Stroke (cerebrum) ([place])   . Stroke ([place])   . Subacute osteomyelitis ([place])   . Tinea unguium   . Uncompensated short term memory deficit      Assessment: Pharmacy consulted to dose heparin in this 69 year old male with ACS/NSTEMI.  Pt was on Eliquis 5 mg PO BID at home, last dose was on 10/08 @ 2224.   CrCl = 44.2 ml/min   10/9 0914 aPTT 86 therapeutic x 1. HL 1.73 still elevated.  10/9 1519 aPTT 59, increased rate to 1100 units/hr 10/9 2132 aPTT 95, therapeutic x 1 10/10 0444 aPTT 124   Goal of Therapy:  Heparin level 0.3-0.7 units/ml aPTT 66 - 102  seconds Monitor platelets by anticoagulation protocol: Yes   Plan:  10/9 2132 aPTT 95 sec. Is therapeutic. Will continue heparin drip at 1100 units/hr. Recheck aPTT at 0500 for confirmation and continue to follow HL with AM labs. CBC daily.  10/10:  APTT = 124  Will decrease heparin to 1000 units/hr and recheck aPTT 6 hrs after rate change.   [name] 03/03/2020 5:30 AM

## 2020-03-11 NOTE — Anesthesia Postprocedure Evaluation (Signed)
Anesthesia Post Note  Patient: Jason Montes  Procedure(s) Performed: LEFT PARTIAL FIRST  RAY AMPUTATION (Left )  Patient location during evaluation: PACU Anesthesia Type: General Level of consciousness: lethargic and confused (similar to preoperative state) Pain management: pain level controlled Vital Signs Assessment: post-procedure vital signs reviewed and stable Respiratory status: spontaneous breathing, nonlabored ventilation, respiratory function stable and patient connected to nasal cannula oxygen Cardiovascular status: blood pressure returned to baseline and stable Postop Assessment: no apparent nausea or vomiting Anesthetic complications: no   No complications documented.   Last Vitals:  Vitals:   03/31/2020 1139 03/18/2020 1148  BP: 101/61 101/68  Pulse: 78 79  Resp: 20 19  Temp:    SpO2: 100% 100%    Last Pain:  Vitals:   03/21/2020 1135  TempSrc:   PainSc: Asleep                 Arita Miss

## 2020-03-11 NOTE — Op Note (Signed)
PODIATRY / FOOT AND ANKLE SURGERY OPERATIVE REPORT    SURGEON: Caroline More, DPM  PRE-OPERATIVE DIAGNOSIS:  1.  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  Sepsis with bacteremia of unknown origin 4.  Diabetes type 2 polyneuropathy 5.  PVD  POST-OPERATIVE DIAGNOSIS: Same  PROCEDURE(S): 1. Left partial first ray amputation  HEMOSTASIS: No tourniquet  ANESTHESIA: MAC  ESTIMATED BLOOD LOSS: 20 cc  FINDING(S): 1.  Distal first metatarsal appeared to be mildly discolored and gray with some softening of bone consistent with osteomyelitis, no purulence.  PATHOLOGY/SPECIMEN(S): Left first metatarsal bone culture, left first metatarsal/first ray pathology specimen with proximal margin marked in purple ink  INDICATIONS:   Jason Montes is a 69 y.o. male who presents with a nonhealing ulceration to the left first metatarsal status post left hallux amputation about 2 months ago.  Patient was lost to follow-up during that time was in rehab and was not seen for about 2 months after his procedure.  Patient came in the office with an open wound dehiscence with first metatarsal head showing.  There were no signs of acute infection at that time although x-rays were taken which showed some chronic changes to the first metatarsal consistent with osteomyelitis.  Discussed with patient revision of procedure that could be performed in the next week but instructed patient on signs of infection if any are present seek medical attention more promptly.  Patient later that day or in the evening time had some altered mental status and was discharged from the rehab facility in which she was staying in.  Patient was brought in by EMS due to this to the hospital and he was found to be septic with unknown origin of infection.  Patient was seen and still had no changes to the first metatarsal with  no erythema or edema, no purulence but the first metatarsal head did appear to be exposed still and again had chronic signs of osteomyelitis on x-ray.  Discussed all treatment options with patient and family both conservative and surgical attempts at correction at this time they have elected for surgical procedure consisting of left partial first ray amputation.  Patient has recently been seen by vascular earlier this week and there was no plan for revascularization procedure.  The patient also a stable eschar to the posterior aspect of the left heel from a pressure ulcer while in rehab and a small pinpoint subcutaneous ulceration to the left second toe which appears to be pinpoint.  DESCRIPTION: After obtaining full informed written consent, the patient was brought back to the operating room and placed supine upon the operating table.  The patient received IV antibiotics prior to induction.  After obtaining adequate anesthesia, 15 cc of half percent Marcaine plain was injected about the left first ray in a Mayo type block.  The patient was prepped and draped in the standard fashion.  No tourniquet was used during the case.  Attention was then directed to the left first metatarsal where a racquet type incision was made around the dehisced area of ulceration around the distal first metatarsal and an arm was extended proximally over the dorsal medial aspect of the first metatarsal to the midshaft.  The incision was made straight to bone.  Circumferential dissection was then performed around the first metatarsal distally to the midshaft level.  At this time a sagittal bone saw was then used to  resect the distal metatarsal at the midshaft level with the appropriate beveling.  The distal metatarsal was passed off the operative site.  A bone culture was taken from this area at the first metatarsal phalangeal joint level and the bone appeared to be soft and had a mild gray appearance but no purulence was expressed  from this area.  The sesamoids were also resected and passed off the operative site.  The pathology specimen consisting of the distal first metatarsal with the most proximal margin marked in purple ink and the sesamoids were passed off and sent to pathology.  The surgical site was flushed with copious amounts normal sterile saline with antibiotic mixed.  The deep structures then reapproximated well coapted with 3-0 Vicryl.  The skin was then reapproximated well coapted with 3-0 nylon combination of simple and horizontal mattress type stitching.  It is important to note that minimal bleeding occurred during this case and that no tourniquet was used indicating patient still has a severe case of vascular disease.  Continue to recommend vascular consultation and follow-up for further assessment and management.  May not be anything that they can do to improve microvascular circulation to the foot.  Applied Xeroform to the incision line followed by 4 x 4 gauze, Kerlix, ABD, Ace wrap.  Patient tolerated the procedure and anesthesia well was transferred to recovery room vital signs stable vascular status remaining to be intact to the left foot but still appears to be questionable at best overall based on intraoperative bleeding.  The patient was discharged back to the inpatient room with the appropriate orders and instructions.  We will continue to follow-up with patient till discharge.  Still do not believe this was likely the area that was acutely infected that cause the bacteremia but believe that all infectious processes from this point have been removed at the left foot level.  COMPLICATIONS: None  CONDITION: Stable  Caroline More, DPM

## 2020-03-11 NOTE — Progress Notes (Addendum)
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 [facility])   Essential hypertension   History of stroke with current residual effects   Acute renal failure superimposed on stage 3a chronic kidney disease ([place])   Subacute osteomyelitis of left foot (HCC)   Aspiration pneumonia ([place])   Chronic systolic congestive heart failure (HCC)   Hyperammonemia (HCC)   Diabetic foot ulcer associated with type 2 diabetes mellitus (HCC)   Severe sepsis with acute organ dysfunction (HCC)   Elevated troponin   Chronic anemia   Severe sepsis (HCC)   Abnormal LFTs   Severe sepsis/ septic shock:  with acute organ dysfunction. Presented w/ fever, tachycardia, elevated WBC, elevated lactic acid & end organ dysfunction (AKI, transaminitis, & encephalopathy). Etiology unclear, possibly multiple infections. Continue on IV vanco, cefepime & flagyl   Bacteremia: blood cxs growing gram positive cocci, likely MRSA, sens pending. Continue on IV vanco. Repeat blood cxs ordered.    PVD: w/ hx of amputation at right midfoot, left MTP & chronic osteomyelitis of left foot. Seen by his podiatrist on 10/8 for necrotic wound left foot with chronic osteomyelitis.  Unlikely source of infection. 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 Podiatry & vascular surgery recs apprec  Transaminitis: severe, etiology unclear, possibly from shock liver vs cholecystitis. [place] abd ordered. Hepatitis panel neg   Thrombocytopenia: etiology unclear. Will continue to monitor. Petechiae of LLE  Aspiration pneumonia: continue on IV abxs. NPO until speech evaluates the pt   Possible colitis: as per CT scan. Etiology unclear.  Continue on IV abxs.   Acute metabolic encephalopathy: likely secondary to infection & psychotropics. Continue to hold gabapentin, haldol, zyprexa   AKI on CKDIIIa: baseline Cr 1.3. Likely pre-renal in etiology. Cr is trending up slightly from day prior  Hyperammoniemia: etiology unclear. Will continue to monitor  A. fib: w/ RVR. Likely PAF. Continue on home dose of amiodarone & eliquis. Continue on tele   Elevated troponin: likely secondary to demand ischemia. Troponin I trending down yesterday. Echo shows EF 40-45%, mildly decreased LV which very similar to pt's echo in June 2021.   DM2: poorly controlled. Continue on SSI w/ accuchecks    HTN: not on any anti-HTN meds as per med rec   Chronic systolic CHF: continue to hold home dose of lasix. Monitor I/Os. Not on BB, ARB/ACE-I    Chronic anemia: no need for a transfusion at this time. Will continue to monitor    DVT prophylaxis: IV heparin  Code Status: DNR Family Communication:  Disposition Plan: likely d/c to SNF  Status is: Inpatient  Remains inpatient appropriate because:Ongoing diagnostic testing needed not appropriate for outpatient work up, IV treatments appropriate due to intensity of illness or inability to take PO and Inpatient level of care appropriate due to severity of illness   Dispo: The patient is from: home               Anticipated d/c is to: SNF              Anticipated d/c date is: > 3 days              Patient currently is not medically stable to  d/c.   Consultants:  ID Podiatry  Vascular surg   Procedures:    Antimicrobials:   Subjective: Pt is lethargic but c/o being cold.  Objective: Vitals:   03/10/20 2200 03/10/20 2300 04/01/2020 0400 04/01/2020 0500  BP: 117/64 112/60 107/61 112/80  Pulse: 85 87 84 81  Resp: (!) 25  20 20   Temp:   98.1 F (36.7 C)   TempSrc:   Oral   SpO2: 97% 100%  100%  Weight:      Height:        Intake/Output Summary (Last 24 hours) at 04/01/2020  0805 Last data filed at 03/28/2020 0405 Gross per 24 hour  Intake 2611.78 ml  Output 1251 ml  Net 1360.78 ml   Filed Weights   03/08/2020 1707 03/10/20 1617  Weight: 79.4 kg 81.1 kg    Examination:  General exam: Appears lethargic Respiratory system: decreased breath sounds b/l  Cardiovascular system: S1/S2+. No rubs or gallops Gastrointestinal system: Abdomen is nondistended, soft and nontender. Hypoactive bowel sounds heard. Central nervous system: Appears lethargic. Moves all 4 extremities  Extremities: left great toe amputation that is dressed. Right transmetatarsal amputation. Petechiae on LLE  Psychiatry: Judgement and insight appear abnormal.      Data Reviewed: I have personally reviewed following labs and imaging studies  CBC: Recent Labs  Lab 03/08/2020 1834 03/10/20 0500 03/24/2020 0444  WBC 21.7* 27.6* 22.6*  HGB 8.3* 7.8* 8.2*  HCT 27.7* 25.6* 27.0*  MCV 85.5 84.2 84.4  PLT 284 186 308*   Basic Metabolic Panel: Recent Labs  Lab 03/22/2020 1755 03/10/20 0500 03/25/2020 0444  NA 134* 133* 135  K 4.5 3.7 3.8  CL 98 104 105  CO2 16* 17* 20*  GLUCOSE 170* 172* 174*  BUN 23 25* 36*  CREATININE 1.86* 1.62* 1.68*  CALCIUM 9.0 7.8* 7.6*   GFR: Estimated Creatinine Clearance: 42.7 mL/min (A) (by C-G formula based on SCr of 1.68 mg/dL (H)). Liver Function Tests: Recent Labs  Lab 03/30/2020 1755 03/10/20 0500 03/22/2020 0444  AST 46* 228* 2,306*  ALT 48* 153* 1,641*  ALKPHOS 108 90 92  BILITOT 1.4* 1.4* 1.7*  PROT 9.6* 7.9 8.2*  ALBUMIN 3.0* 2.5* 2.6*   No results for input(s): LIPASE, AMYLASE in the last 168 hours. Recent Labs  Lab 03/16/2020 1755  AMMONIA 50*   Coagulation Profile: Recent Labs  Lab 03/31/2020 1755 03/10/20 0500  INR 1.8* 2.7*   Cardiac Enzymes: No results for input(s): CKTOTAL, CKMB, CKMBINDEX, TROPONINI in the last 168 hours. BNP (last 3 results) No results for input(s): PROBNP in the last 8760 hours. HbA1C: No results for  input(s): HGBA1C in the last 72 hours. CBG: Recent Labs  Lab 03/10/20 1619 03/10/20 2032 03/10/20 2356 03/19/2020 0453 03/25/2020 0735  GLUCAP 144* 141* 154* 162* 146*   Lipid Profile: No results for input(s): CHOL, HDL, LDLCALC, TRIG, CHOLHDL, LDLDIRECT in the last 72 hours. Thyroid Function Tests: Recent Labs    03/08/2020 1755  TSH 1.952   Anemia Panel: No results for input(s): VITAMINB12, FOLATE, FERRITIN, TIBC, IRON, RETICCTPCT in the last 72 hours. Sepsis Labs: Recent Labs  Lab 03/28/2020 1755 03/02/2020 2000 03/11/20 0444  PROCALCITON <0.10  --  28.61  LATICACIDVEN 11.0* 2.9*  --     Recent Results (from the past 240 hour(s))  Aerobic/Anaerobic Culture (surgical/deep wound)     Status: None (Preliminary result)   Collection Time: 03/22/2020 12:36 PM   Specimen: Wound  Result Value Ref Range Status  Specimen Description   Final    WOUND Performed at [facility], [address] [place], [place] [address]    Special Requests   Final    NONE Performed at [facility], [place], [place] [address]    Gram Stain   Final    FEW WBC PRESENT, PREDOMINANTLY MONONUCLEAR FEW GRAM POSITIVE COCCI IN PAIRS IN CLUSTERS Performed at [facility], [place] [address] [place], [place] [address]    Culture PENDING  Incomplete   Report Status PENDING  Incomplete  Blood Culture (routine x 2)     Status: None (Preliminary result)   Collection Time: 03/08/2020  5:55 PM   Specimen: BLOOD  Result Value Ref Range Status   Specimen Description   Final    BLOOD RIGHT ANTECUBITAL Performed at [facility], [address], [place], [place] [address]    Special Requests   Final    BOTTLES DRAWN AEROBIC AND ANAEROBIC Blood Culture results may not be optimal due to an excessive volume of blood received in culture bottles Performed at [facility], [address] [place], [place] [address]    Culture  Setup Time IN BOTH AEROBIC AND  ANAEROBIC BOTTLES  Final   Culture   Final    NO GROWTH 2 DAYS Performed at [facility], [address], [place], [place] [address]    Report Status PENDING  Incomplete  Blood Culture (routine x 2)     Status: None (Preliminary result)   Collection Time: 03/24/2020  5:55 PM   Specimen: BLOOD  Result Value Ref Range Status   Specimen Description BLOOD LEFT ANTECUBITAL  Final   Special Requests   Final    BOTTLES DRAWN AEROBIC AND ANAEROBIC Blood Culture results may not be optimal due to an excessive volume of blood received in culture bottles   Culture  Setup Time   Final    NO ORGANISMS SEEN ANAEROBIC BOTTLE ONLY Organism ID to follow GRAM POSITIVE COCCI AEROBIC BOTTLE ONLY CRITICAL RESULT CALLED TO, READ BACK BY AND VERIFIED WITH: ALEX CHAPPELL AT 1017 0/9/21 SDR Performed at [facility], [address] [place], [place] [address]    Culture GRAM POSITIVE COCCI  Final   Report Status PENDING  Incomplete  Blood Culture ID Panel (Reflexed)     Status: Abnormal   Collection Time: 03/08/2020  5:55 PM  Result Value Ref Range Status   Enterococcus faecalis NOT DETECTED NOT DETECTED Final   Enterococcus Faecium NOT DETECTED NOT DETECTED Final   Listeria monocytogenes NOT DETECTED NOT DETECTED Final   Staphylococcus species DETECTED (A) NOT DETECTED Final    Comment: CRITICAL RESULT CALLED TO, READ BACK BY AND VERIFIED WITH:  ALEX CHAPPELL AT 6948 03/10/20 SDR    Staphylococcus aureus (BCID) DETECTED (A) NOT DETECTED Final    Comment: Methicillin (oxacillin)-resistant Staphylococcus aureus (MRSA). MRSA is predictably resistant to beta-lactam antibiotics (except ceftaroline). Preferred therapy is vancomycin unless clinically contraindicated. Patient requires contact precautions if  hospitalized. CRITICAL RESULT CALLED TO, READ BACK BY AND VERIFIED WITH:  ALEX CHAPPELL AT 1017 03/10/20 SDR    Staphylococcus epidermidis NOT DETECTED NOT DETECTED Final   Staphylococcus  lugdunensis NOT DETECTED NOT DETECTED Final   Streptococcus species NOT DETECTED NOT DETECTED Final   Streptococcus agalactiae NOT DETECTED NOT DETECTED Final   Streptococcus pneumoniae NOT DETECTED NOT DETECTED Final   Streptococcus pyogenes NOT DETECTED NOT DETECTED Final   A.calcoaceticus-baumannii NOT DETECTED NOT DETECTED  Final   Bacteroides fragilis NOT DETECTED NOT DETECTED Final   Enterobacterales NOT DETECTED NOT DETECTED Final   Enterobacter cloacae complex NOT DETECTED NOT DETECTED Final   Escherichia coli NOT DETECTED NOT DETECTED Final   Klebsiella aerogenes NOT DETECTED NOT DETECTED Final   Klebsiella oxytoca NOT DETECTED NOT DETECTED Final   Klebsiella pneumoniae NOT DETECTED NOT DETECTED Final   Proteus species NOT DETECTED NOT DETECTED Final   Salmonella species NOT DETECTED NOT DETECTED Final   Serratia marcescens NOT DETECTED NOT DETECTED Final   Haemophilus influenzae NOT DETECTED NOT DETECTED Final   Neisseria meningitidis NOT DETECTED NOT DETECTED Final   Pseudomonas aeruginosa NOT DETECTED NOT DETECTED Final   Stenotrophomonas maltophilia NOT DETECTED NOT DETECTED Final   Candida albicans NOT DETECTED NOT DETECTED Final   Candida auris NOT DETECTED NOT DETECTED Final   Candida glabrata NOT DETECTED NOT DETECTED Final   Candida krusei NOT DETECTED NOT DETECTED Final   Candida parapsilosis NOT DETECTED NOT DETECTED Final   Candida tropicalis NOT DETECTED NOT DETECTED Final   Cryptococcus neoformans/gattii NOT DETECTED NOT DETECTED Final   Meth resistant mecA/C and MREJ DETECTED (A) NOT DETECTED Final    Comment: CRITICAL RESULT CALLED TO, READ BACK BY AND VERIFIED WITH:  ALEX CHAPPELL AT 1751 03/10/20 SDR Performed at [facility], [address] [place], [place] [address]   Respiratory Panel by RT PCR (Flu A&B, Covid) - Nasopharyngeal Swab     Status: None   Collection Time: 03/02/2020  6:21 PM   Specimen: Nasopharyngeal Swab  Result Value Ref Range  Status   SARS Coronavirus 2 by RT PCR NEGATIVE NEGATIVE Final    Comment: (NOTE) SARS-CoV-2 target nucleic acids are NOT DETECTED.  The SARS-CoV-2 RNA is generally detectable in upper respiratoy specimens during the acute phase of infection. The lowest concentration of SARS-CoV-2 viral copies this assay can detect is 131 copies/mL. A negative result does not preclude SARS-Cov-2 infection and should not be used as the sole basis for treatment or other patient management decisions. A negative result may occur with  improper specimen collection/handling, submission of specimen other than nasopharyngeal swab, presence of viral mutation(s) within the areas targeted by this assay, and inadequate number of viral copies (<131 copies/mL). A negative result must be combined with clinical observations, patient history, and epidemiological information. The expected result is Negative.  Fact Sheet for Patients:  PinkCheek.be  Fact Sheet for Healthcare Providers:  GravelBags.it  This test is no t yet approved or cleared by the [place] FDA and  has been authorized for detection and/or diagnosis of SARS-CoV-2 by FDA under an Emergency Use Authorization (EUA). This EUA will remain  in effect (meaning this test can be used) for the duration of the COVID-19 declaration under Section 564(b)(1) of the Act, 21 U.S.C. section 360bbb-3(b)(1), unless the authorization is terminated or revoked sooner.     Influenza A by PCR NEGATIVE NEGATIVE Final   Influenza B by PCR NEGATIVE NEGATIVE Final    Comment: (NOTE) The Xpert Xpress SARS-CoV-2/FLU/RSV assay is intended as an aid in  the diagnosis of influenza from Nasopharyngeal swab specimens and  should not be used as a sole basis for treatment. Nasal washings and  aspirates are unacceptable for Xpert Xpress SARS-CoV-2/FLU/RSV  testing.  Fact Sheet for  Patients: PinkCheek.be  Fact Sheet for Healthcare Providers: GravelBags.it  This test is not yet approved or cleared by the [place] FDA and  has been authorized for detection and/or  diagnosis of SARS-CoV-2 by  FDA under an Emergency Use Authorization (EUA). This EUA will remain  in effect (meaning this test can be used) for the duration of the  Covid-19 declaration under Section 564(b)(1) of the Act, 21  U.S.C. section 360bbb-3(b)(1), unless the authorization is  terminated or revoked. Performed at [facility], [address] [place], [place] [address]   MRSA PCR Screening     Status: Abnormal   Collection Time: 03/10/20  4:21 PM   Specimen: Nasopharyngeal  Result Value Ref Range Status   MRSA by PCR POSITIVE (A) NEGATIVE Final    Comment:        The GeneXpert MRSA Assay (FDA approved for NASAL specimens only), is one component of a comprehensive MRSA colonization surveillance program. It is not intended to diagnose MRSA infection nor to guide or monitor treatment for MRSA infections. RESULT CALLED TO, READ BACK BY AND VERIFIED WITH:  [facility] HARRIS AT 1729 03/10/20 SDR Performed at [facility] [address] [place], [place] [address]          Radiology Studies: CT ABDOMEN PELVIS WO CONTRAST  Result Date: 03/05/2020 CLINICAL DATA:  Abnormal LFTs, possible sepsis EXAM: CT ABDOMEN AND PELVIS WITHOUT CONTRAST TECHNIQUE: Multidetector CT imaging of the abdomen and pelvis was performed following the standard protocol without IV contrast. COMPARISON:  CT 04/08/2019 FINDINGS: Motion degraded imaging of the lower chest and upper abdomen. Imaging quality further degraded due to increased image noise from the patient's arms over the upper abdomen. Lower chest: Small bilateral effusions. Adjacent atelectatic changes are present. Some additional heterogenous opacity could reflect further  atelectasis at, edema or early consolidation. Evidence of prior sternotomy and CABG. Extensive calcification of the native coronary arteries. Pacer lead terminates at the cardiac apex. No pericardial effusion. Hepatobiliary: No visible focal liver lesion on this unenhanced CT. Some mild perihepatic hazy stranding may be present though this is possibly accentuated by extensive motion artifact and streak in the upper abdomen. No focal liver lesion is seen. High attenuation material seen within the gallbladder lumen, possibly biliary sand versus vicarious excretion of contrast though should correlate for recent contrast enhanced studies, none available at the time of interpretation. No focal pericholecystic inflammation is seen. No biliary ductal dilatation or visible intraductal gallstones. Pancreas: Unremarkable. No pancreatic ductal dilatation or surrounding inflammatory changes. Spleen: Normal in size. No concerning splenic lesions. Adrenals/Urinary Tract: Normal adrenal glands. Extensive bilateral perinephric stranding is increased from comparison imaging. No discernible focal renal lesion. Renal vascular calcifications. No urolithiasis or hydronephrosis. Circumferential bladder wall thickening is present with some focal thickening towards the anterosuperior bladder dome, possibly reactive. Stomach/Bowel: Small hiatal hernia. Stomach mildly distended with air and ingested fluid. No significant small bowel thickening or dilatation. Diffuse pancolonic edematous mural thickening with faint hazy pericolonic stranding. No evidence of obstruction. High attenuation fecal material, could reflect ingested bismuth containing products, inspissated stool or previously administered enteric contrast media. Vascular/Lymphatic: Atherosclerotic calcifications within the abdominal aorta and branch vessels. No aneurysm or ectasia. No enlarged abdominopelvic lymph nodes. Some edematous periaortic nodes are present. No pathologically  enlarged abdominopelvic lymph nodes. Reproductive: Mild prostatomegaly. Seminal vesicles are unremarkable. Other: Diffuse body wall edema. Edematous changes in the central mesentery. Trace free fluid in the pelvis. No free air. No organized collection or abscess. Tiny fat containing right inguinal hernia. No bowel containing hernias. Musculoskeletal: Multilevel degenerative changes are present in the imaged portions of the spine. Additional moderate degenerative changes in the hips as  well as fusion across the bilateral SI joints. Prior sternotomy. Prior right femoral intramedullary nail and transcervical pin. No acute osseous abnormality or suspicious osseous lesion. IMPRESSION: 1. Diffuse pancolonic edematous mural thickening with faint hazy pericolonic stranding, consistent with a nonspecific colitis of infectious or inflammatory etiology. 2. Extensive bilateral perinephric stranding is increased from comparison imaging. This is nonspecific but can be seen in the setting of ascending urinary tract infection given the circumferential bladder wall thickening as well. Recommend correlation with urinalysis. 3. Small bilateral effusions with adjacent atelectatic changes. Some additional heterogenous opacity could reflect further atelectasis, edema or early consolidation. 4. High attenuation material within the gallbladder lumen, possibly biliary sand versus vicarious excretion of contrast though no recent contrast enhanced study is seen in the patient's chart. Correlate with history. Could consider right upper quadrant ultrasound. 5. Some hazy stranding about the liver, nonspecific and possibly accentuated by motion artifact and image noise though can be seen in the setting of hepatitis, given elevated LFTs. 6. Additional features of anasarca with body wall edema. 7. Aortic Atherosclerosis (ICD10-I70.0). Electronically Signed   By: Lovena Le M.D.   On: 03/16/2020 21:44   CT Head Wo Contrast  Result Date:  03/24/2020 CLINICAL DATA:  69 year old male with altered mental status. EXAM: CT HEAD WITHOUT CONTRAST TECHNIQUE: Contiguous axial images were obtained from the base of the skull through the vertex without intravenous contrast. COMPARISON:  Head CT dated 01/23/2020. FINDINGS: Brain: There is mild age-related atrophy and chronic microvascular ischemic changes. 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 There is no acute intracranial hemorrhage. No mass effect or midline shift. No extra-axial fluid collection. Vascular: No hyperdense vessel or unexpected calcification. Skull: Normal. Negative for fracture or focal lesion. Sinuses/Orbits: No acute finding. Other: None IMPRESSION: 1. No acute intracranial hemorrhage. 2. Mild age-related atrophy and chronic microvascular ischemic changes. 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 Electronically Signed   By: Anner Crete M.D.   On: 03/10/2020 18:03   DG Chest Port 1 View  Result Date: 03/27/2020 CLINICAL DATA:  Possible sepsis EXAM: PORTABLE CHEST 1 VIEW COMPARISON:  Radiograph 01/17/2020 FINDINGS: Mild airways thickening with some increasing interstitial and patchy opacities in lung bases and retrocardiac space. Background of chronically coarsened interstitial features. No pneumothorax or visible effusion. Pacer/AICD overlies the left chest wall. Post sternotomy and CABG changes likely with abandoned epicardial pacer leads in the upper abdomen. Chronic cardiomegaly with a calcified aorta, not significantly changed accounting for differences in technique. The osseous structures appear diffusely demineralized which may limit detection of small or nondisplaced fractures. No acute osseous or soft tissue abnormality. Moderate to severe degenerative changes are present in the imaged spine and shoulders. IMPRESSION: 1. Mild airways thickening with some increasing interstitial and patchy  opacities in the lung bases and retrocardiac space. Could reflect developing infection or edema. 2. Chronic interstitial changes. 3. Cardiomegaly similar to prior. Prior sternotomy and CABG. 4.  Aortic Atherosclerosis (ICD10-I70.0). Electronically Signed   By: Lovena Le M.D.   On: 03/18/2020 18:20   DG Foot Complete Left  Result Date: 03/10/2020 CLINICAL DATA:  [place] EXAM: LEFT FOOT - COMPLETE 3+ VIEW COMPARISON:  January 13, 2020 FINDINGS: Status post amputation of LEFT great toe. Osteopenia. There is a new erosion of the medial aspect of the first metatarsal. There is increased lucency along medial aspect of the first metatarsal. The asymmetric sclerosis of the first metatarsal head, similar comparison to  prior. Increased irregularity and cortical indistinctness of the lateral hallux sesamoid. Vascular calcifications. Scattered midfoot degenerative changes. Enthesophyte of the Achilles tendon. IMPRESSION: 1. New erosion of the medial aspect of the first metatarsal with increased lucency along the medial aspect of the first metatarsal, concerning for osteomyelitis. 2. Increased irregularity and cortical indistinctness of the lateral hallux sesamoid, suspicious for osteomyelitis. Electronically Signed   By: Valentino Saxon MD   On: 03/10/2020 14:20   ECHOCARDIOGRAM COMPLETE  Result Date: 03/10/2020    ECHOCARDIOGRAM REPORT   Patient Name:   COLESON KANT Date of Exam: 03/10/2020 Medical Rec #:  [id number]           Height:       69.0 in Accession #:    [id number]          Weight:       175.0 lb Date of Birth:  05/17/1951           BSA:          1.952 m Patient Age:    14 years            BP:           81/60 mmHg Patient Gender: M                   HR:           85 bpm. Exam Location:  [facility] Procedure: 2D Echo, Cardiac Doppler and Color Doppler Indications:     Abnormal ECG 794.31  History:         Patient has prior history of Echocardiogram examinations, most                  recent 11/15/2019.  Stroke; Risk Factors:Hypertension. PVD.  Sonographer:     Sherrie Sport RDCS (AE) Referring Phys:  [id number] Wyvonnia Dusky Diagnosing Phys: Ida Rogue MD  Sonographer Comments: Image acquisition challenging due to uncooperative patient. IMPRESSIONS  1. Limited study secondary to patient terminating study prematurely.  2. Left ventricular ejection fraction could not be accurately estimated, grossly 40 to 45%. The left ventricle has mildly decreased function. Unable to exclude anteroseptal hypokinesis.  3. Right ventricle is not well visualized. Right ventricular systolic function is grossly mildly reduced. The right ventricular size is normal.  4. Left atrial size was mildly dilated.  5. The mitral valve is normal in structure. Mild mitral valve regurgitation.  6. The aortic valve was not well visualized. Aortic valve regurgitation is not visualized. FINDINGS  Left Ventricle: Left ventricular ejection fraction, by estimation, is 40 to 45%. The left ventricle has mildly decreased function. The left ventricular internal cavity size was normal in size. There is no left ventricular hypertrophy. Left ventricular diastolic parameters are indeterminate. Right Ventricle: The right ventricular size is normal. No increase in right ventricular wall thickness. Right ventricular systolic function is mildly reduced. Left Atrium: Left atrial size was mildly dilated. Right Atrium: Right atrial size was normal in size. Pericardium: The pericardium was not well visualized. Mitral Valve: The mitral valve is normal in structure. Mild mitral valve regurgitation. Tricuspid Valve: The tricuspid valve is not well visualized. Tricuspid valve regurgitation is not demonstrated. Aortic Valve: The aortic valve was not well visualized. Aortic valve regurgitation is not visualized. Pulmonic Valve: The pulmonic valve was not well visualized. Pulmonic valve regurgitation is not visualized. Aorta: The aortic root was not well visualized and the  ascending aorta was not well visualized. IAS/Shunts: The interatrial septum  was not assessed. Additional Comments: A pacer wire is visualized.  LEFT VENTRICLE PLAX 2D LVIDd:         4.94 cm LVIDs:         4.65 cm LV PW:         1.06 cm LV IVS:        1.03 cm LVOT diam:     2.00 cm LVOT Area:     3.14 cm  LEFT ATRIUM         Index LA diam:    4.60 cm 2.36 cm/m   AORTA Ao Root diam: 3.40 cm  SHUNTS Systemic Diam: 2.00 cm Ida Rogue MD Electronically signed by Ida Rogue MD Signature Date/Time: 03/10/2020/4:03:17 PM    Final         Scheduled Meds: . amiodarone  200 mg Oral Daily  . aspirin EC  81 mg Oral Daily  . atorvastatin  40 mg Oral QHS  . chlorhexidine  60 mL Topical Once  . Chlorhexidine Gluconate Cloth  6 each Topical Daily  . Chlorhexidine Gluconate Cloth  6 each Topical Q0600  . cholecalciferol  1,000 Units Oral Daily  . DULoxetine  20 mg Oral Daily  . famotidine  20 mg Oral Daily  . insulin aspart  0-9 Units Subcutaneous Q4H  . methylPREDNISolone (SOLU-MEDROL) injection  40 mg Intravenous Q12H  . mupirocin ointment  1 application Nasal BID  . pantoprazole (PROTONIX) IV  40 mg Intravenous Q24H   Continuous Infusions: . sodium chloride 100 mL/hr at 03/09/2020 0122  . sodium chloride Stopped (03/10/20 0854)  . ceFEPime (MAXIPIME) IV 2 g (03/10/20 2034)  . metronidazole 500 mg (03/25/2020 0241)  . norepinephrine (LEVOPHED) Adult infusion Stopped (03/10/20 1420)  . vancomycin 750 mg (03/03/2020 0405)     LOS: 2 days    Time spent: 35 mins     Wyvonnia Dusky, MD [facility] Pager [phone]  If 7PM-7AM, please contact night-coverage [email or web address] 03/04/2020, 8:05 AM

## 2020-03-11 NOTE — Progress Notes (Signed)
Patient required mutliple calls to provider to increased agitation and calling out. Ativan ordered twice so far. Patient is belligerent, parroting, has unclear thought process. Patient continues to try and get out of bed with out assistance, and yelling out help. When staff enter there room and ask, "how can I help you?", patient replies, "how can I help you".  Bed alarm on and functioning without difficulties noted. Call bell kept within reach. Patient keep removing the male pure wick. Prevalon boots placed. Another request sent to provider for another dose of ativan. Awaiting response.

## 2020-03-11 NOTE — Consult Note (Signed)
NAME: SANDER REMEDIOS  DOB: [birth year]  MRN: [id number]  Date/Time: 04/01/2020 3:20 PM  REQUESTING PROVIDER: Dr. Damita Dunnings Subjective:  REASON FOR CONSULT: MRSA bacteremia No History from patient, chart reviewed ? MILAS SCHAPPELL is a 69 y.o. male with a history of A. fib, coronary artery disease, CABG, permanent pacemaker, diabetes mellitus with neuropathy and foot infection, PVD, CKD, CVA, right TMA, recent left great toe excision August 2021 is admitted from home with altered mental status some confusion and vomiting. Patient was recently in the hospital between 01/11/2020 until 02/03/2020 and then went to rehab between 02/03/2020 until 02/28/2020.  During that hospitalization he had infected left great toe which was amputated on 01/13/2020.  Full He also had angioplasty of the left tibial artery and left peroneal in August 21. He was followed as outpatient by the podiatrist. He had seen Dr. Luana Shu on 02/08/2020 before his admission to the ED and was noted to have a chronic wound at the site of the previous amputation with exposed bone.  An x-ray was done on that showed subtle changes of the first metatarsal head consistent with subacute or chronic osteomyelitis. On arrival in the ED had a temperature of 103.2, BP of 118/55. Labs revealed a creatinine of 1.86, lactate of 11, INR of 1.8, WBC of 21.7, Hb of 8.3, platelet of 284. Blood cultures were sent. Was started on Vanco cefepime and Flagyl CT of the head revealed mild age-related atrophy and chronic microvascular ischemic changes.  Large area of old infarct and encephalomalacia involving the left PCA territory is seen.  CT of the abdomen and pelvis revealed diffuse body wall edema, edematous changes in the central mesentery, trace free fluid in the pelvis, diffuse pancolonic edematous mural thickening with faint hazy pericolonic stranding.  No evidence of obstruction.  There was mild prostatomegaly.  Extensive bilateral perinephric stranding was  seen which was increased from the prior presentation.  There was also circumferential bladder wall thickening.  Urine analysis showed 0-5 WBC with negative leukoesterase and negative nitrite.  Patient was admitted to ICU and as he had low blood pressure not responding to fluids he was started on pressors. Blood culture came back positive for MRSA and I am seeing the patient for the same. Patient also was taken for surgery this morning by Dr. Luana Shu and underwent left partial first ray amputation.  Past Medical History:  Diagnosis Date  . Allergy   . Atrial fibrillation ([place])   . B12 deficiency   . Chronic kidney disease   . Chronic kidney disease (CKD), stage II (mild)   . Coronary atherosclerosis   . Diabetes mellitus without complication ([place])   . Elevated PSA   . Esophagitis   . GERD (gastroesophageal reflux disease)   . Heart attack ([place])   . Heart disease   . Hyperlipidemia   . Hypertension   . Hypokalemia   . Mild neurocognitive disorder   . Osteoarthritis   . Presbyopia   . PVD (peripheral vascular disease) ([place])   . Restless leg syndrome   . Stroke (cerebrum) ([place])   . Stroke ([place])   . Subacute osteomyelitis ([place])   . Tinea unguium   . Uncompensated short term memory deficit     Past Surgical History:  Procedure Laterality Date  . AMPUTATION TOE Left 01/13/2020   Procedure: AMPUTATION RAY LEFT 1ST;  Surgeon: Caroline More, DPM;  Location: [facility] ORS;  Service: Podiatry;  Laterality: Left;  . CARDIAC PACEMAKER PLACEMENT    . COLONOSCOPY  WITH PROPOFOL N/A 05/18/2018   Procedure: COLONOSCOPY WITH PROPOFOL;  Surgeon: [name], MD;  Location: [facility] ENDOSCOPY;  Service: Endoscopy;  Laterality: N/A;  . CORONARY ARTERY BYPASS GRAFT    . FOOT AMPUTATION Right   . INTRAMEDULLARY (IM) NAIL INTERTROCHANTERIC Right 11/16/2019   Procedure: INTRAMEDULLARY (IM) NAIL INTERTROCHANTRIC;  Surgeon: Thornton Park, MD;  Location: [facility] ORS;  Service: Orthopedics;  Laterality: Right;  .  LOWER EXTREMITY ANGIOGRAPHY Left 10/10/2019   Procedure: Lower Extremity Angiography;  Surgeon: Algernon Huxley, MD;  Location: [place] [facility];  Service: Cardiovascular;  Laterality: Left;  . LOWER EXTREMITY ANGIOGRAPHY Left 01/16/2020   Procedure: Lower Extremity Angiography;  Surgeon: Algernon Huxley, MD;  Location: [place] [facility];  Service: Cardiovascular;  Laterality: Left;    Social History   Socioeconomic History  . Marital status: Single    Spouse name: Not on file  . Number of children: Not on file  . Years of education: Not on file  . Highest education level: Not on file  Occupational History  . Not on file  Tobacco Use  . Smoking status: Never Smoker  . Smokeless tobacco: Never Used  Vaping Use  . Vaping Use: Never used  Substance and Sexual Activity  . Alcohol use: Never  . Drug use: Never  . Sexual activity: Not on file  Other Topics Concern  . Not on file  Social History Narrative  . Not on file   Social Determinants of Health   Financial Resource Strain:   . Difficulty of Paying Living Expenses: Not on file  Food Insecurity:   . Worried About Charity fundraiser in the Last Year: Not on file  . Ran Out of Food in the Last Year: Not on file  Transportation Needs:   . Lack of Transportation (Medical): Not on file  . Lack of Transportation (Non-Medical): Not on file  Physical Activity:   . Days of Exercise per Week: Not on file  . Minutes of Exercise per Session: Not on file  Stress:   . Feeling of Stress : Not on file  Social Connections:   . Frequency of Communication with Friends and Family: Not on file  . Frequency of Social Gatherings with Friends and Family: Not on file  . Attends Religious Services: Not on file  . Active Member of Clubs or Organizations: Not on file  . Attends Archivist Meetings: Not on file  . Marital Status: Not on file  Intimate Partner Violence:   . Fear of Current or Ex-Partner: Not on file  . Emotionally  Abused: Not on file  . Physically Abused: Not on file  . Sexually Abused: Not on file    Family History  Problem Relation Age of Onset  . Heart failure Mother   . Colon cancer Mother   . Diabetes Mother   . Thyroid disease Sister    Allergies  Allergen Reactions  . Amlodipine Other (See Comments)  . Penicillins     Childhood allergy, not sure what happens  Other reaction(s): Other, Other  . Norvasc [Amlodipine Besylate]     Unknown    ? Current Facility-Administered Medications  Medication Dose Route Frequency Provider Last Rate Last Admin  . 0.9 %  sodium chloride infusion   Intravenous Continuous Wyvonnia Dusky, MD 100 mL/hr at 03/04/2020 0122 New Bag at 03/29/2020 0122  . 0.9 %  sodium chloride infusion  250 mL Intravenous Continuous Awilda Bill, NP  Stopped at 03/10/20 0854  . acetaminophen (TYLENOL) tablet 650 mg  650 mg Oral Q6H PRN Athena Masse, MD       Or  . acetaminophen (TYLENOL) suppository 650 mg  650 mg Rectal Q6H PRN Athena Masse, MD      . amiodarone (PACERONE) tablet 200 mg  200 mg Oral Daily Athena Masse, MD      . aspirin EC tablet 81 mg  81 mg Oral Daily Judd Gaudier V, MD      . atorvastatin (LIPITOR) tablet 40 mg  40 mg Oral QHS Athena Masse, MD   40 mg at 03/04/2020 2224  . ceFEPIme (MAXIPIME) 2 g in sodium chloride 0.9 % 100 mL IVPB  2 g Intravenous Q12H Judd Gaudier V, MD 200 mL/hr at 03/10/2020 0815 2 g at 03/22/2020 0815  . Chlorhexidine Gluconate Cloth 2 % PADS 6 each  6 each Topical Daily Wyvonnia Dusky, MD      . Chlorhexidine Gluconate Cloth 2 % PADS 6 each  6 each Topical Q0600 Wyvonnia Dusky, MD   6 each at 03/13/2020 0501  . cholecalciferol (VITAMIN D) tablet 1,000 Units  1,000 Units Oral Daily Judd Gaudier V, MD      . DULoxetine (CYMBALTA) DR capsule 20 mg  20 mg Oral Daily Judd Gaudier V, MD      . famotidine (PEPCID) tablet 20 mg  20 mg Oral Daily Judd Gaudier V, MD      . insulin aspart (novoLOG) injection 0-9  Units  0-9 Units Subcutaneous Q4H Athena Masse, MD   1 Units at 03/05/2020 [phone]  . LORazepam (ATIVAN) injection 1 mg  1 mg Intravenous Q6H PRN Sharion Settler, NP   1 mg at 03/18/2020 0548  . methylPREDNISolone sodium succinate (SOLU-MEDROL) 40 mg/mL injection 40 mg  40 mg Intravenous Q12H Sharion Settler, NP   40 mg at 03/10/20 2222  . metroNIDAZOLE (FLAGYL) IVPB 500 mg  500 mg Intravenous Q8H Judd Gaudier V, MD 100 mL/hr at 03/02/2020 0241 500 mg at 03/16/2020 1019  . mupirocin ointment (BACTROBAN) 2 % 1 application  1 application Nasal BID Wyvonnia Dusky, MD   1 application at 60/63/01 2222  . norepinephrine (LEVOPHED) 4mg  in 283mL premix infusion  2-10 mcg/min Intravenous Titrated Awilda Bill, NP   Paused at 03/10/20 1420  . ondansetron (ZOFRAN) tablet 4 mg  4 mg Oral Q6H PRN Athena Masse, MD       Or  . ondansetron [facility]) injection 4 mg  4 mg Intravenous Q6H PRN Athena Masse, MD      . pantoprazole (PROTONIX) injection 40 mg  40 mg Intravenous Q24H Athena Masse, MD   40 mg at 03/10/20 2039  . vancomycin (VANCOREADY) IVPB 750 mg/150 mL  750 mg Intravenous Q12H Wyvonnia Dusky, MD 150 mL/hr at 03/26/2020 0405 750 mg at 04/01/2020 0405     Abtx:  Anti-infectives (From admission, onward)   Start     Dose/Rate Route Frequency Ordered Stop   03/10/20 2000  vancomycin (VANCOCIN) IVPB 1000 mg/200 mL premix  Status:  Discontinued        1,000 mg 200 mL/hr over 60 Minutes Intravenous Every 24 hours 03/02/2020 2050 03/10/20 1006   03/10/20 1500  vancomycin (VANCOREADY) IVPB 750 mg/150 mL        750 mg 150 mL/hr over 60 Minutes Intravenous Every 12 hours 03/10/20 1006     03/10/20 0700  ceFEPIme (MAXIPIME)  2 g in sodium chloride 0.9 % 100 mL IVPB        2 g 200 mL/hr over 30 Minutes Intravenous Every 12 hours 03/16/2020 2050     03/10/20 0200  metroNIDAZOLE (FLAGYL) IVPB 500 mg        500 mg 100 mL/hr over 60 Minutes Intravenous Every 8 hours 03/03/2020 2043     04/01/2020 2045   ceFEPIme (MAXIPIME) 2 g in sodium chloride 0.9 % 100 mL IVPB  Status:  Discontinued        2 g 200 mL/hr over 30 Minutes Intravenous  Once 03/18/2020 2043 03/02/2020 2047   03/10/2020 2045  vancomycin (VANCOCIN) IVPB 1000 mg/200 mL premix  Status:  Discontinued        1,000 mg 200 mL/hr over 60 Minutes Intravenous  Once 03/06/2020 2043 03/12/2020 2047   03/28/2020 1730  vancomycin (VANCOREADY) IVPB 750 mg/150 mL        750 mg 150 mL/hr over 60 Minutes Intravenous  Once 03/14/2020 1728 03/08/2020 2105   03/24/2020 1730  ceFEPIme (MAXIPIME) 2 g in sodium chloride 0.9 % 100 mL IVPB        2 g 200 mL/hr over 30 Minutes Intravenous  Once 04/01/2020 1728 03/03/2020 1919   03/22/2020 1715  aztreonam (AZACTAM) 2 g in sodium chloride 0.9 % 100 mL IVPB  Status:  Discontinued        2 g 200 mL/hr over 30 Minutes Intravenous  Once 03/05/2020 1712 03/13/2020 1728   03/22/2020 1715  metroNIDAZOLE (FLAGYL) IVPB 500 mg        500 mg 100 mL/hr over 60 Minutes Intravenous  Once 03/25/2020 1712 03/15/2020 1925   03/16/2020 1715  vancomycin (VANCOCIN) IVPB 1000 mg/200 mL premix        1,000 mg 200 mL/hr over 60 Minutes Intravenous  Once 03/22/2020 1712 03/22/2020 1944      REVIEW OF SYSTEMS:  NA  Objective:  VITALS:  BP 116/76   Pulse 81   Temp (!) 96.7 F (35.9 C)   Resp (!) 21   Ht 5\' 9"  (1.753 m)   Wt 81.1 kg   SpO2 96%   BMI 26.40 kg/m  PHYSICAL EXAM:  General:obtunded, on calling his name he says yes, eyes closed. Followed some commands like sticking his tongue out Pale Head: Normocephalic, without obvious abnormality, atraumatic. Eyes: Conjunctivae clear, anicteric sclerae. Pupils are equal ENT Nares normal. No drainage or sinus tenderness. Lips, mucosa, and tongue normal. No Thrush Neck: Supple, symmetrical, no adenopathy, thyroid: non tender no carotid bruit and no JVD. Back: did not examine Lungs: b/l air entry Heart: irregular- rate well controlled Sternal scar ..bony prominence over the left side of the scar   Pacemaker site okay Abdomen: Soft, non-tender,not distended. Bowel sounds normal. No masses Extremities: rt TMA Multiple petechial scabs   Left foot- surgical dressing not removed     Skin: as above Lymph: Cervical, supraclavicular normal. Neurologic: cannot be assessed Pertinent Labs Lab Results CBC    Component Value Date/Time   WBC 22.6 (H) 03/24/2020 0444   RBC 3.20 (L) 03/24/2020 0444   HGB 8.2 (L) 03/19/2020 0444   HCT 27.0 (L) 03/07/2020 0444   PLT 129 (L) 03/05/2020 0444   MCV 84.4 03/16/2020 0444   MCH 25.6 (L) 03/05/2020 0444   MCHC 30.4 03/07/2020 0444   RDW 17.9 (H) 03/12/2020 0444   LYMPHSABS 0.7 02/23/2020 1249   MONOABS 0.5 02/23/2020 1249   EOSABS 0.3 02/23/2020 1249  BASOSABS 0.1 02/23/2020 1249    CMP Latest Ref Rng & Units 03/08/2020 03/10/2020 03/16/2020  Glucose 70 - 99 mg/dL 174(H) 172(H) 170(H)  BUN 8 - 23 mg/dL 36(H) 25(H) 23  Creatinine 0.61 - 1.24 mg/dL 1.68(H) 1.62(H) 1.86(H)  Sodium 135 - 145 mmol/L 135 133(L) 134(L)  Potassium 3.5 - 5.1 mmol/L 3.8 3.7 4.5  Chloride 98 - 111 mmol/L 105 104 98  CO2 22 - 32 mmol/L 20(L) 17(L) 16(L)  Calcium 8.9 - 10.3 mg/dL 7.6(L) 7.8(L) 9.0  Total Protein 6.5 - 8.1 g/dL 8.2(H) 7.9 9.6(H)  Total Bilirubin 0.3 - 1.2 mg/dL 1.7(H) 1.4(H) 1.4(H)  Alkaline Phos 38 - 126 U/L 92 90 108  AST 15 - 41 U/L 2,306(H) 228(H) 46(H)  ALT 0 - 44 U/L 1,641(H) 153(H) 48(H)      Microbiology: Recent Results (from the past 240 hour(s))  Aerobic/Anaerobic Culture (surgical/deep wound)     Status: None (Preliminary result)   Collection Time: 03/25/2020 12:36 PM   Specimen: Wound  Result Value Ref Range Status   Specimen Description   Final    WOUND Performed at [facility], [address], [place], [place] [address]    Special Requests   Final    NONE Performed at [facility], [address] [place], [place] [address]    Gram Stain   Final    FEW WBC PRESENT, PREDOMINANTLY MONONUCLEAR FEW  GRAM POSITIVE COCCI IN PAIRS IN CLUSTERS    Culture   Final    TOO YOUNG TO READ Performed at [facility], [place] [address] [place], [place] [address]    Report Status PENDING  Incomplete  Blood Culture (routine x 2)     Status: Abnormal (Preliminary result)   Collection Time: 03/04/2020  5:55 PM   Specimen: BLOOD  Result Value Ref Range Status   Specimen Description   Final    BLOOD RIGHT ANTECUBITAL Performed at [facility], [address] [place], [place] [address]    Special Requests   Final    BOTTLES DRAWN AEROBIC AND ANAEROBIC Blood Culture results may not be optimal due to an excessive volume of blood received in culture bottles Performed at [facility], [address] [place], [place] [address]    Culture  Setup Time IN BOTH AEROBIC AND ANAEROBIC BOTTLES  Final   Culture STAPHYLOCOCCUS AUREUS (A)  Final   Report Status PENDING  Incomplete  Blood Culture (routine x 2)     Status: Abnormal (Preliminary result)   Collection Time: 03/14/2020  5:55 PM   Specimen: BLOOD  Result Value Ref Range Status   Specimen Description   Final    BLOOD LEFT ANTECUBITAL Performed at [facility], [address] [place], [place] [address]    Special Requests   Final    BOTTLES DRAWN AEROBIC AND ANAEROBIC Blood Culture results may not be optimal due to an excessive volume of blood received in culture bottles Performed at [facility], [address] [place], [place] [address]    Culture  Setup Time   Final    ANAEROBIC BOTTLE ONLY GRAM POSITIVE COCCI AEROBIC BOTTLE ONLY CRITICAL RESULT CALLED TO, READ BACK BY AND VERIFIED WITH: ALEX CHAPPELL AT 2297 0/9/21 SDR Performed at [facility], [place] [address], [place], [place] [address]    Culture STAPHYLOCOCCUS AUREUS (A)  Final   Report Status PENDING  Incomplete  Blood Culture ID Panel (Reflexed)     Status:  Abnormal   Collection Time: 03/16/2020  5:55 PM  Result Value Ref Range Status    Enterococcus faecalis NOT DETECTED NOT DETECTED Final   Enterococcus Faecium NOT DETECTED NOT DETECTED Final   Listeria monocytogenes NOT DETECTED NOT DETECTED Final   Staphylococcus species DETECTED (A) NOT DETECTED Final    Comment: CRITICAL RESULT CALLED TO, READ BACK BY AND VERIFIED WITH:  ALEX CHAPPELL AT 1017 03/10/20 SDR    Staphylococcus aureus (BCID) DETECTED (A) NOT DETECTED Final    Comment: Methicillin (oxacillin)-resistant Staphylococcus aureus (MRSA). MRSA is predictably resistant to beta-lactam antibiotics (except ceftaroline). Preferred therapy is vancomycin unless clinically contraindicated. Patient requires contact precautions if  hospitalized. CRITICAL RESULT CALLED TO, READ BACK BY AND VERIFIED WITH:  ALEX CHAPPELL AT 1017 03/10/20 SDR    Staphylococcus epidermidis NOT DETECTED NOT DETECTED Final   Staphylococcus lugdunensis NOT DETECTED NOT DETECTED Final   Streptococcus species NOT DETECTED NOT DETECTED Final   Streptococcus agalactiae NOT DETECTED NOT DETECTED Final   Streptococcus pneumoniae NOT DETECTED NOT DETECTED Final   Streptococcus pyogenes NOT DETECTED NOT DETECTED Final   A.calcoaceticus-baumannii NOT DETECTED NOT DETECTED Final   Bacteroides fragilis NOT DETECTED NOT DETECTED Final   Enterobacterales NOT DETECTED NOT DETECTED Final   Enterobacter cloacae complex NOT DETECTED NOT DETECTED Final   Escherichia coli NOT DETECTED NOT DETECTED Final   Klebsiella aerogenes NOT DETECTED NOT DETECTED Final   Klebsiella oxytoca NOT DETECTED NOT DETECTED Final   Klebsiella pneumoniae NOT DETECTED NOT DETECTED Final   Proteus species NOT DETECTED NOT DETECTED Final   Salmonella species NOT DETECTED NOT DETECTED Final   Serratia marcescens NOT DETECTED NOT DETECTED Final   Haemophilus influenzae NOT DETECTED NOT DETECTED Final   Neisseria meningitidis NOT DETECTED NOT DETECTED Final   Pseudomonas aeruginosa NOT DETECTED NOT DETECTED Final   Stenotrophomonas  maltophilia NOT DETECTED NOT DETECTED Final   Candida albicans NOT DETECTED NOT DETECTED Final   Candida auris NOT DETECTED NOT DETECTED Final   Candida glabrata NOT DETECTED NOT DETECTED Final   Candida krusei NOT DETECTED NOT DETECTED Final   Candida parapsilosis NOT DETECTED NOT DETECTED Final   Candida tropicalis NOT DETECTED NOT DETECTED Final   Cryptococcus neoformans/gattii NOT DETECTED NOT DETECTED Final   Meth resistant mecA/C and MREJ DETECTED (A) NOT DETECTED Final    Comment: CRITICAL RESULT CALLED TO, READ BACK BY AND VERIFIED WITH:  ALEX CHAPPELL AT 1017 03/10/20 SDR Performed at [facility], [address] [place], [place] [address]   Respiratory Panel by RT PCR (Flu A&B, Covid) - Nasopharyngeal Swab     Status: None   Collection Time: 03/19/2020  6:21 PM   Specimen: Nasopharyngeal Swab  Result Value Ref Range Status   SARS Coronavirus 2 by RT PCR NEGATIVE NEGATIVE Final    Comment: (NOTE) SARS-CoV-2 target nucleic acids are NOT DETECTED.  The SARS-CoV-2 RNA is generally detectable in upper respiratoy specimens during the acute phase of infection. The lowest concentration of SARS-CoV-2 viral copies this assay can detect is 131 copies/mL. A negative result does not preclude SARS-Cov-2 infection and should not be used as the sole basis for treatment or other patient management decisions. A negative result may occur with  improper specimen collection/handling, submission of specimen other than nasopharyngeal swab, presence of viral mutation(s) within the areas targeted by this assay, and inadequate number of viral copies (<131 copies/mL). A negative result must be combined with clinical observations, patient history, and epidemiological information. The expected result  is Negative.  Fact Sheet for Patients:  PinkCheek.be  Fact Sheet for Healthcare Providers:  GravelBags.it  This test is no t yet  approved or cleared by the [place] FDA and  has been authorized for detection and/or diagnosis of SARS-CoV-2 by FDA under an Emergency Use Authorization (EUA). This EUA will remain  in effect (meaning this test can be used) for the duration of the COVID-19 declaration under Section 564(b)(1) of the Act, 21 U.S.C. section 360bbb-3(b)(1), unless the authorization is terminated or revoked sooner.     Influenza A by PCR NEGATIVE NEGATIVE Final   Influenza B by PCR NEGATIVE NEGATIVE Final    Comment: (NOTE) The Xpert Xpress SARS-CoV-2/FLU/RSV assay is intended as an aid in  the diagnosis of influenza from Nasopharyngeal swab specimens and  should not be used as a sole basis for treatment. Nasal washings and  aspirates are unacceptable for Xpert Xpress SARS-CoV-2/FLU/RSV  testing.  Fact Sheet for Patients: PinkCheek.be  Fact Sheet for Healthcare Providers: GravelBags.it  This test is not yet approved or cleared by the [place] FDA and  has been authorized for detection and/or diagnosis of SARS-CoV-2 by  FDA under an Emergency Use Authorization (EUA). This EUA will remain  in effect (meaning this test can be used) for the duration of the  Covid-19 declaration under Section 564(b)(1) of the Act, 21  U.S.C. section 360bbb-3(b)(1), unless the authorization is  terminated or revoked. Performed at [facility], [address], [place], [place] [address]   Urine culture     Status: None   Collection Time: 03/08/2020  9:06 PM   Specimen: In/Out Cath Urine  Result Value Ref Range Status   Specimen Description   Final    IN/OUT CATH URINE Performed at [facility], [address], [place], [place] [address]    Special Requests   Final    NONE Performed at [facility], [address] [place], [place] [address]    Culture   Final    NO GROWTH Performed at [facility], [place]  [address], [place], [place] [address]    Report Status 03/10/2020 FINAL  Final  MRSA PCR Screening     Status: Abnormal   Collection Time: 03/10/20  4:21 PM   Specimen: Nasopharyngeal  Result Value Ref Range Status   MRSA by PCR POSITIVE (A) NEGATIVE Final    Comment:        The GeneXpert MRSA Assay (FDA approved for NASAL specimens only), is one component of a comprehensive MRSA colonization surveillance program. It is not intended to diagnose MRSA infection nor to guide or monitor treatment for MRSA infections. RESULT CALLED TO, READ BACK BY AND VERIFIED WITH:  [facility] HARRIS AT 2202 03/10/20 SDR Performed at [facility], [address] [place], [place] [address]     IMAGING RESULTS:  I have personally reviewed the films ? Impression/Recommendation ?Septic shock with MRSA bacteremia. Possible source is the left great toe ulcer and chronic osteomyelitis Patient has a pacemaker and hence concern for endocarditis 2D echo done on 03/10/2020 showed an EF of 40 to 45%.  The aortic valve was not well visualized.  The mitral valve was normal in structure.  Limited study secondary to patient terminating study prematurely. Will need TEE ?  Abnormal LFTs likely shock liver with underlying infection  Anemia  Hypoalbuminemia with anasarca. Colon wall edema and mesenteric edema is all likely related to the hypoalbuminemia.  I doubt he has  colitis. Abdomen soft, no diarrhea  will stop cefepime and Flagyl.  Even though he has bilateral perinephric stranding his urine examination does not show any evidence of infection.  This likely would be again due to intra-abdominal edema from hypoalbuminemia. Check post void bladder scan to look for residue ___________________________________________________ Discussed with his daughter and his nurse Note:  This document was prepared using Dragon voice recognition software and may include unintentional dictation errors.

## 2020-03-12 ENCOUNTER — Inpatient Hospital Stay: Payer: No Typology Code available for payment source

## 2020-03-12 ENCOUNTER — Encounter: Payer: Self-pay | Admitting: Podiatry

## 2020-03-12 ENCOUNTER — Encounter: Payer: Medicare Other | Admitting: Registered Nurse

## 2020-03-12 DIAGNOSIS — A4102 Sepsis due to Methicillin resistant Staphylococcus aureus: Secondary | ICD-10-CM | POA: Diagnosis not present

## 2020-03-12 DIAGNOSIS — M8660 Other chronic osteomyelitis, unspecified site: Secondary | ICD-10-CM | POA: Diagnosis not present

## 2020-03-12 DIAGNOSIS — R6521 Severe sepsis with septic shock: Secondary | ICD-10-CM | POA: Diagnosis not present

## 2020-03-12 DIAGNOSIS — R7401 Elevation of levels of liver transaminase levels: Secondary | ICD-10-CM

## 2020-03-12 DIAGNOSIS — B9561 Methicillin susceptible Staphylococcus aureus infection as the cause of diseases classified elsewhere: Secondary | ICD-10-CM | POA: Diagnosis not present

## 2020-03-12 DIAGNOSIS — L97529 Non-pressure chronic ulcer of other part of left foot with unspecified severity: Secondary | ICD-10-CM | POA: Diagnosis not present

## 2020-03-12 DIAGNOSIS — R7881 Bacteremia: Secondary | ICD-10-CM | POA: Diagnosis not present

## 2020-03-12 DIAGNOSIS — G9341 Metabolic encephalopathy: Secondary | ICD-10-CM | POA: Diagnosis not present

## 2020-03-12 DIAGNOSIS — L97429 Non-pressure chronic ulcer of left heel and midfoot with unspecified severity: Secondary | ICD-10-CM

## 2020-03-12 DIAGNOSIS — A419 Sepsis, unspecified organism: Secondary | ICD-10-CM | POA: Diagnosis not present

## 2020-03-12 LAB — GLUCOSE, CAPILLARY
Glucose-Capillary: 103 mg/dL — ABNORMAL HIGH (ref 70–99)
Glucose-Capillary: 123 mg/dL — ABNORMAL HIGH (ref 70–99)
Glucose-Capillary: 131 mg/dL — ABNORMAL HIGH (ref 70–99)
Glucose-Capillary: 133 mg/dL — ABNORMAL HIGH (ref 70–99)
Glucose-Capillary: 141 mg/dL — ABNORMAL HIGH (ref 70–99)
Glucose-Capillary: 184 mg/dL — ABNORMAL HIGH (ref 70–99)
Glucose-Capillary: 96 mg/dL (ref 70–99)

## 2020-03-12 LAB — COMPREHENSIVE METABOLIC PANEL
ALT: 3337 U/L — ABNORMAL HIGH (ref 0–44)
AST: 4704 U/L — ABNORMAL HIGH (ref 15–41)
Albumin: 2.5 g/dL — ABNORMAL LOW (ref 3.5–5.0)
Alkaline Phosphatase: 104 U/L (ref 38–126)
Anion gap: 13 (ref 5–15)
BUN: 49 mg/dL — ABNORMAL HIGH (ref 8–23)
CO2: 17 mmol/L — ABNORMAL LOW (ref 22–32)
Calcium: 7.8 mg/dL — ABNORMAL LOW (ref 8.9–10.3)
Chloride: 109 mmol/L (ref 98–111)
Creatinine, Ser: 1.84 mg/dL — ABNORMAL HIGH (ref 0.61–1.24)
GFR, Estimated: 37 mL/min — ABNORMAL LOW (ref >60)
Glucose, Bld: 138 mg/dL — ABNORMAL HIGH (ref 70–99)
Potassium: 3.9 mmol/L (ref 3.5–5.1)
Sodium: 139 mmol/L (ref 135–145)
Total Bilirubin: 2 mg/dL — ABNORMAL HIGH (ref 0.3–1.2)
Total Protein: 8.1 g/dL (ref 6.5–8.1)

## 2020-03-12 LAB — PROCALCITONIN: Procalcitonin: 21.04 ng/mL

## 2020-03-12 LAB — CULTURE, BLOOD (ROUTINE X 2)

## 2020-03-12 LAB — CBC
HCT: 31.3 % — ABNORMAL LOW (ref 39.0–52.0)
Hemoglobin: 9 g/dL — ABNORMAL LOW (ref 13.0–17.0)
MCH: 24.8 pg — ABNORMAL LOW (ref 26.0–34.0)
MCHC: 28.8 g/dL — ABNORMAL LOW (ref 30.0–36.0)
MCV: 86.2 fL (ref 80.0–100.0)
Platelets: 75 10*3/uL — ABNORMAL LOW (ref 150–400)
RBC: 3.63 MIL/uL — ABNORMAL LOW (ref 4.22–5.81)
RDW: 18 % — ABNORMAL HIGH (ref 11.5–15.5)
WBC: 22.9 10*3/uL — ABNORMAL HIGH (ref 4.0–10.5)
nRBC: 0.2 % (ref 0.0–0.2)

## 2020-03-12 LAB — APTT: aPTT: 40 seconds — ABNORMAL HIGH (ref 24–36)

## 2020-03-12 LAB — FIBRINOGEN: Fibrinogen: 341 mg/dL (ref 210–475)

## 2020-03-12 LAB — LACTATE DEHYDROGENASE: LDH: 2278 U/L — ABNORMAL HIGH (ref 98–192)

## 2020-03-12 LAB — PROTIME-INR
INR: 5.6 (ref 0.8–1.2)
Prothrombin Time: 48.9 seconds — ABNORMAL HIGH (ref 11.4–15.2)

## 2020-03-12 MED ORDER — METRONIDAZOLE IN NACL 5-0.79 MG/ML-% IV SOLN
500.0000 mg | Freq: Three times a day (TID) | INTRAVENOUS | Status: DC
  Filled 2020-03-12 (×2): qty 100

## 2020-03-12 MED ORDER — VANCOMYCIN HCL IN DEXTROSE 1-5 GM/200ML-% IV SOLN
1000.0000 mg | INTRAVENOUS | Status: DC
  Administered 2020-03-13: 1000 mg via INTRAVENOUS
  Filled 2020-03-12 (×3): qty 200

## 2020-03-12 NOTE — Evaluation (Addendum)
Clinical/Bedside Swallow Evaluation Patient Details  Name: Jason Montes MRN: [id number] Date of Birth: 04-06-1951  Today's Date: 03/12/2020 Time: SLP Start Time (ACUTE ONLY): 1000 SLP Stop Time (ACUTE ONLY): 1100 SLP Time Calculation (min) (ACUTE ONLY): 60 min  Past Medical History:  Past Medical History:  Diagnosis Date  . Allergy   . Atrial fibrillation ([place])   . B12 deficiency   . Chronic kidney disease   . Chronic kidney disease (CKD), stage II (mild)   . Coronary atherosclerosis   . Diabetes mellitus without complication ([place])   . Elevated PSA   . Esophagitis   . GERD (gastroesophageal reflux disease)   . Heart attack ([place])   . Heart disease   . Hyperlipidemia   . Hypertension   . Hypokalemia   . Mild neurocognitive disorder   . Osteoarthritis   . Presbyopia   . PVD (peripheral vascular disease) ([place])   . Restless leg syndrome   . Stroke (cerebrum) ([place])   . Stroke ([place])   . Subacute osteomyelitis ([place])   . Tinea unguium   . Uncompensated short term memory deficit    Past Surgical History:  Past Surgical History:  Procedure Laterality Date  . AMPUTATION Left 03/28/2020   Procedure: LEFT PARTIAL FIRST  RAY AMPUTATION;  Surgeon: Caroline More, DPM;  Location: [facility] ORS;  Service: Podiatry;  Laterality: Left;  . AMPUTATION TOE Left 01/13/2020   Procedure: AMPUTATION RAY LEFT 1ST;  Surgeon: Caroline More, DPM;  Location: [facility] ORS;  Service: Podiatry;  Laterality: Left;  . CARDIAC PACEMAKER PLACEMENT    . COLONOSCOPY WITH PROPOFOL N/A 05/18/2018   Procedure: COLONOSCOPY WITH PROPOFOL;  Surgeon: [name], MD;  Location: [facility] ENDOSCOPY;  Service: Endoscopy;  Laterality: N/A;  . CORONARY ARTERY BYPASS GRAFT    . FOOT AMPUTATION Right   . INTRAMEDULLARY (IM) NAIL INTERTROCHANTERIC Right 11/16/2019   Procedure: INTRAMEDULLARY (IM) NAIL INTERTROCHANTRIC;  Surgeon: Thornton Park, MD;  Location: [facility] ORS;  Service: Orthopedics;  Laterality: Right;  . LOWER  EXTREMITY ANGIOGRAPHY Left 10/10/2019   Procedure: Lower Extremity Angiography;  Surgeon: Algernon Huxley, MD;  Location: [place] [facility];  Service: Cardiovascular;  Laterality: Left;  . LOWER EXTREMITY ANGIOGRAPHY Left 01/16/2020   Procedure: Lower Extremity Angiography;  Surgeon: Algernon Huxley, MD;  Location: [place] [facility];  Service: Cardiovascular;  Laterality: Left;   HPI:  Pt is a 69 y.o. male with medical history significant for HTN, HLD, A. 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 Luana Shu and debrided earlier on the day of arrival on 03/20/2020, and with recent prolonged rehab stay from 9/3-9/28 who presents to the emergency room with a several day history of vomiting.  Most of the history taken from his daughter over the phone.  She states patient had been vomiting for the past few days but otherwise was in his usual state of health however EMS reports that patient was confused.  He denies cough, fever, shortness of breath, chest pain, abdominal pain, change in bowel habits but history is unreliable as he mostly answers yes and no and is unable to elaborate further.  Dx: Acute metabolic encephalopathy; followed by Podiatry also.    Assessment / Plan / Recommendation Clinical Impression  Pt appears to present w/ oropharyngeal phase dysphagia w/ risk for prandial aspiration  significantly impacted by his declined Cognitive awareness and engagement in po tasks. He initially appeared to tolerate trials of thin liquids via Cup, however, overt coughing noted toward end of trials - unsure if a fatigue factor in conjunction w/ his declined Cognitive awareness. He appeared to beter tolerate trials of Nectar consistency liquids, and purees. Although, oral phase c/b min decreased bolus awareness  intermittently w/ min extended oral phase prior to oral clearing. Suspect this could be impacted by pt's declined Cognitive presentation. Given time and verbal cues, pt cleared appropriately. Alternating sips and bites was helpful as well. No unilateral oral weakness noted at rest, or w/ bolus management. Pt required verbal cues for follow through w/ po tasks, holding Cup to drink.  Recommend a modified consistency diet/po's of Dysphagia level 1 (puree) w/ Nectar consistency liquids; aspiration precautions. Pills in Puree for safer swallowing. Feeding Support at all meals for supervision and cues. NSG/MD consulted re: pt's presentation and agreed w/ recommendations.  SLP Visit Diagnosis: Dysphagia, oropharyngeal phase (R13.12) (Cognitive decline)    Aspiration Risk  Mild aspiration risk;Risk for inadequate nutrition/hydration    Diet Recommendation  Dysphagia level 1 (puree) w/ NECTAR consistency liquids; aspiration precautions; feeding support at all meals.  Medication Administration: Crushed with puree (vs Whole in Puree for safer swallowing)    Other  Recommendations Recommended Consults:  (unknown) Oral Care Recommendations: Oral care BID;Oral care before and after PO;Staff/trained caregiver to provide oral care Other Recommendations: Order thickener from pharmacy;Prohibited food (jello, ice cream, thin soups);Remove water pitcher;Have oral suction available   Follow up Recommendations  (TBD)      Frequency and Duration min 3x week  2 weeks       Prognosis Prognosis for Safe Diet Advancement: Fair Barriers to Reach Goals: Cognitive deficits;Time post onset;Severity of deficits;Behavior      Swallow Study   General Date of Onset: 03/06/2020 HPI: Pt is a 69 y.o. male with medical history significant for HTN, HLD, A. fib on Eliquis, CVA, diabetes with neuropathy, CKD stage IIIa, CAD status post CABG, systolic CHF, PVD status post right transmet and left MTP amputations, with with chronic  left foot neuropathic ulcer with chronic osteomyelitis, followed by podiatry, Dr. Luana Shu and debrided earlier on the day of arrival on 03/12/2020, and with recent prolonged rehab stay from 9/3-9/28 who presents to the emergency room with a several day history of vomiting.  Most of the history taken from his daughter over the phone.  She states patient had been vomiting for the past few days but otherwise was in his usual state of health however EMS reports that patient was confused.  He denies cough, fever, shortness of breath, chest pain, abdominal pain, change in bowel habits but history is unreliable as he mostly answers yes and no and is unable to elaborate further.  Dx: Acute metabolic encephalopathy; followed by Podiatry also.  Type of Study: Bedside Swallow Evaluation Previous Swallow Assessment: BSE 01/2020; Language 02/2020 Diet Prior to this Study: NPO (regular diet prior) Temperature Spikes Noted: No (wbc 22.9) Respiratory Status: Nasal cannula (2L) History of Recent Intubation: No Behavior/Cognition: Cooperative;Pleasant mood;Confused;Distractible;Requires cueing;Doesn't follow directions (Eyes closed) Oral Cavity Assessment: Dry Oral Care Completed by SLP: Yes Oral Cavity - Dentition: Adequate natural dentition;Missing dentition (few) Vision:  (n/a) Self-Feeding Abilities: Total assist Patient Positioning: Upright in bed (needed positioning) Baseline Vocal Quality: Normal (when he spoke) Volitional Cough: Cognitively unable to elicit Volitional Swallow: Unable to elicit    Oral/Motor/Sensory Function Overall Oral Motor/Sensory Function:  Within functional limits (w/ boluses)   Ice Chips Ice chips: Within functional limits Presentation: Spoon (fed; 5 trials)   Thin Liquid Thin Liquid: Impaired Presentation: Cup;Straw (fed/monitored by SLP; ~15 trials) Oral Phase Impairments: Poor awareness of bolus Pharyngeal  Phase Impairments: Cough - Immediate;Cough - Delayed (x2 toward end of  trials) Other Comments: poor awareness    Nectar Thick Nectar Thick Liquid: Within functional limits Presentation: Spoon;Straw (fed/monitored by SLP: ~3 ozs)   Honey Thick Honey Thick Liquid: Not tested   Puree Puree: Impaired Presentation: Spoon (fed; 10+ trials) Oral Phase Impairments: Poor awareness of bolus Pharyngeal Phase Impairments:  (none)   Solid     Solid: Not tested        Orinda Kenner, MS, CCC-SLP Speech Language Pathologist Rehab Services [phone] Laurelyn Terrero 03/12/2020,3:52 PM

## 2020-03-12 NOTE — Progress Notes (Signed)
Patient noted with a new onset irregular rhythm and vent bigeminy on the monitor. Patient's CPOT pain score is 0. Provider made aware. Awaiting response.

## 2020-03-12 NOTE — Progress Notes (Addendum)
Date of Admission:  03/31/2020      cefepime 10/8 >> 10/10 metronidazole 10/8 >> 10/10 vancomycin 10/8 >>    ID: MATHIUS BIRKELAND is a 69 y.o. 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 [facility])   Essential hypertension   History of stroke with current residual effects   Acute renal failure superimposed on stage 3a chronic kidney disease ([place])   Subacute osteomyelitis of left foot (HCC)   Aspiration pneumonia (HCC)   Chronic systolic congestive heart failure (HCC)   Hyperammonemia (HCC)   Diabetic foot ulcer associated with type 2 diabetes mellitus (HCC)   Severe sepsis with acute organ dysfunction (HCC)   Elevated troponin   Chronic anemia   Severe sepsis (HCC)   Abnormal LFTs   Atherosclerosis of native arteries of the extremities with ulceration (HCC)    Subjective: As per nurse restless and somnolent  Medications:   aspirin EC  81 mg Oral Daily   Chlorhexidine Gluconate Cloth  6 each Topical Daily   cholecalciferol  1,000 Units Oral Daily   DULoxetine  20 mg Oral Daily   famotidine  20 mg Oral Daily   insulin aspart  0-9 Units Subcutaneous Q4H   methylPREDNISolone (SOLU-MEDROL) injection  40 mg Intravenous Q12H   mupirocin ointment  1 application Nasal BID   pantoprazole (PROTONIX) IV  40 mg Intravenous Q24H    Objective: Vital signs in last 24 hours: Temp:  [98.3 F (36.8 C)-99.8 F (37.7 C)] 99.4 F (37.4 C) (10/11 0800) Pulse Rate:  [29-88] 87 (10/11 1400) Resp:  [17-35] 17 (10/11 1400) BP: (101-112)/(50-71) 110/50 (10/11 1200) SpO2:  [94 %-100 %] 98 % (10/11 1400)  PHYSICAL EXAM:  General: somnolent- on calling his name says he is fine but does not open his eyes,  Eyes: Conjunctivae clear, anicteric sclerae. Pupils are equal Neck: Supple, symmetrical,  no adenopathy, thyroid: non tender no carotid bruit and no JVD. Back:did not examine Lungs: b/l air entry. Heart: s1s2 Abdomen: Soft, non-tender,not distended. Bowel sounds normal. No masses Extremities:             Skin: as above Lymph: Cervical, supraclavicular normal. Neurologic: cannot asses  Lab Results Recent Labs    03/18/2020 0444 03/12/20 0338  WBC 22.6* 22.9*  HGB 8.2* 9.0*  HCT 27.0* 31.3*  NA 135 139  K 3.8 3.9  CL 105 109  CO2 20* 17*  BUN 36* 49*  CREATININE 1.68* 1.84*   Liver Panel Recent Labs    03/24/2020 0444 03/12/20 0338  PROT 8.2* 8.1  ALBUMIN 2.6* 2.5*  AST 2,306* 4,704*  ALT 1,641* 3,337*  ALKPHOS 92 104  BILITOT 1.7* 2.0*   Sedimentation Rate No results for input(s): ESRSEDRATE in the last 72 hours. C-Reactive Protein No results for input(s): CRP in the last 72 hours.  Microbiology:  Studies/Results: DG Chest Port 1 View  Result Date: 03/12/2020 CLINICAL DATA:  Tachypnea. EXAM: PORTABLE CHEST 1 VIEW COMPARISON:  March 09, 2020. FINDINGS: Stable cardiomegaly. Status post coronary bypass graft. Left-sided pacemaker is unchanged in position. No pneumothorax or pleural effusion is noted. Stable minimal to mild bibasilar opacities are noted concerning for scarring or atelectasis. Bony thorax is unremarkable. IMPRESSION: No active disease. Electronically Signed   By: Marijo Conception M.D.   On: 03/12/2020 08:41  DG Foot Complete Left  Result Date: 03/12/2020 CLINICAL DATA:  Partial first ray amputation EXAM: LEFT FOOT - COMPLETE 3+ VIEW COMPARISON:  03/10/2020 FINDINGS: Interval transmetatarsal amputation of the first ray at the level of the proximal metaphysis. Remaining osseous structures appear intact and unchanged in appearance. No additional sites of bony erosion or cortical destruction. Expected postoperative changes within the soft tissues at the resection site. Advanced small vessel vascular calcifications. IMPRESSION: Interval  transmetatarsal amputation of the left first ray. Electronically Signed   By: Davina Poke D.O.   On: 03/12/2020 08:29   ECHOCARDIOGRAM COMPLETE  Result Date: 03/10/2020    ECHOCARDIOGRAM REPORT   Patient Name:   NINA HOAR Date of Exam: 03/10/2020 Medical Rec #:  [id number]           Height:       69.0 in Accession #:    [id number]          Weight:       175.0 lb Date of Birth:  [birth year]           BSA:          1.952 m Patient Age:    19 years            BP:           81/60 mmHg Patient Gender: M                   HR:           85 bpm. Exam Location:  [facility] Procedure: 2D Echo, Cardiac Doppler and Color Doppler Indications:     Abnormal ECG 794.31  History:         Patient has prior history of Echocardiogram examinations, most                  recent 11/15/2019. Stroke; Risk Factors:Hypertension. PVD.  Sonographer:     Sherrie Sport RDCS (AE) Referring Phys:  [id number] Wyvonnia Dusky Diagnosing Phys: Ida Rogue MD  Sonographer Comments: Image acquisition challenging due to uncooperative patient. IMPRESSIONS  1. Limited study secondary to patient terminating study prematurely.  2. Left ventricular ejection fraction could not be accurately estimated, grossly 40 to 45%. The left ventricle has mildly decreased function. Unable to exclude anteroseptal hypokinesis.  3. Right ventricle is not well visualized. Right ventricular systolic function is grossly mildly reduced. The right ventricular size is normal.  4. Left atrial size was mildly dilated.  5. The mitral valve is normal in structure. Mild mitral valve regurgitation.  6. The aortic valve was not well visualized. Aortic valve regurgitation is not visualized. FINDINGS  Left Ventricle: Left ventricular ejection fraction, by estimation, is 40 to 45%. The left ventricle has mildly decreased function. The left ventricular internal cavity size was normal in size. There is no left ventricular hypertrophy. Left ventricular diastolic parameters are  indeterminate. Right Ventricle: The right ventricular size is normal. No increase in right ventricular wall thickness. Right ventricular systolic function is mildly reduced. Left Atrium: Left atrial size was mildly dilated. Right Atrium: Right atrial size was normal in size. Pericardium: The pericardium was not well visualized. Mitral Valve: The mitral valve is normal in structure. Mild mitral valve regurgitation. Tricuspid Valve: The tricuspid valve is not well visualized. Tricuspid valve regurgitation is not demonstrated. Aortic Valve: The aortic valve was not well visualized. Aortic valve regurgitation is not visualized. Pulmonic Valve: The pulmonic valve was not well visualized. Pulmonic valve regurgitation is not  visualized. Aorta: The aortic root was not well visualized and the ascending aorta was not well visualized. IAS/Shunts: The interatrial septum was not assessed. Additional Comments: A pacer wire is visualized.  LEFT VENTRICLE PLAX 2D LVIDd:         4.94 cm LVIDs:         4.65 cm LV PW:         1.06 cm LV IVS:        1.03 cm LVOT diam:     2.00 cm LVOT Area:     3.14 cm  LEFT ATRIUM         Index LA diam:    4.60 cm 2.36 cm/m   AORTA Ao Root diam: 3.40 cm  SHUNTS Systemic Diam: 2.00 cm Ida Rogue MD Electronically signed by Ida Rogue MD Signature Date/Time: 03/10/2020/4:03:17 PM    Final    US Abdomen Limited RUQ  Result Date: 03/26/2020 CLINICAL DATA:  Elevated liver enzymes. EXAM: ULTRASOUND ABDOMEN LIMITED RIGHT UPPER QUADRANT COMPARISON:  None. FINDINGS: Gallbladder: No gallstones or wall thickening visualized. No sonographic Murphy sign noted by sonographer. Common bile duct: Diameter: 3 mm, within normal limits. Liver: No focal lesion identified. Within normal limits in parenchymal echogenicity. Portal vein is patent on color Doppler imaging with normal direction of blood flow towards the liver. Other: Right pleural effusion incidentally noted. IMPRESSION: No hepatobiliary  abnormality identified. Right pleural effusion incidentally noted. Electronically Signed   By: Marlaine Hind M.D.   On: 03/03/2020 19:31     Assessment/Plan:  Septic shock with MRSA bacteremia. Possible source is the left great toe ulcer and chronic osteomyelitis Patient has a pacemaker and hence concern for endocarditis 2D echo done on 03/10/2020 showed an EF of 40 to 45%.  The aortic valve was not well visualized.  The mitral valve was normal in structure.  Limited study secondary to patient terminating study prematurely. Will need TEE ? Left great toe amputaion site infection with staph- underwent ray excision yesterday  Left heel ulcer- this could be due to MRSA- as well- need culture  Acutely increased transaminases > 4000- could be from shock liver But with high INR, High PTT need to r/o acute liver failure- recommend GI consult  Thrombocytopenia, with abnormal coags could be DIC   Anemia  Hypoalbuminemia with anasarca. Colon wall edema and mesenteric edema is all likely related to the hypoalbuminemia.  I doubt he has colitis. Abdomen soft, no diarrhea   Even though he has bilateral perinephric stranding his urine examination does not show any evidence of infection.  This likely would be again due to intra-abdominal edema from hypoalbuminemia.   Discussed the management with his nurse

## 2020-03-12 NOTE — Progress Notes (Signed)
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 [facility])   Essential hypertension   History of stroke with current residual effects   Acute renal failure superimposed on stage 3a chronic kidney disease ([place])   Subacute osteomyelitis of left foot (HCC)   Aspiration pneumonia ([place])   Chronic systolic congestive heart failure (HCC)   Hyperammonemia (HCC)   Diabetic foot ulcer associated with type 2 diabetes mellitus (HCC)   Severe sepsis with acute organ dysfunction (HCC)   Elevated troponin   Chronic anemia   Severe sepsis (HCC)   Abnormal LFTs   Atherosclerosis of native arteries of the extremities with ulceration (HCC)   Septic shock:  with acute organ dysfunction. Presented w/ fever, tachycardia, elevated WBC, elevated lactic acid & end organ dysfunction (AKI, transaminitis, & encephalopathy). Etiology unclear, possibly multiple infections. Continue on IV vanco  Bacteremia: blood cxs growing gram positive cocci, likely MRSA, sens pending. Continue on IV vanco. Repeat blood cxs NGTD. Cardio consulted for TEE    PVD: w/ hx of amputation at right midfoot, left MTP & chronic osteomyelitis of left foot. Seen by his podiatrist on 10/8 for necrotic wound left foot with chronic osteomyelitis.  Unlikely source of infection. 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 Podiatry & vascular surgery recs apprec  Transaminitis: severe & trending up, etiology unclear, possibly from shock liver. [place] abd shows no hepatobiliary abnormality identified. Hepatitis panel neg   Thrombocytopenia: etiology unclear. Will continue to monitor. Petechiae of LLE. PT, PTT, LDH are all elevated.  Haptoglobin pending. Fibrinogen is WNL. Possible DIC, so hematology was consulted   Aspiration pneumonia: continue on IV abxs. Started on dysphagia diet as per speech   Possible colitis: as per CT scan. Etiology unclear. Continue on IV abxs.   Acute metabolic encephalopathy: likely secondary to infection & psychotropics. Continue to hold gabapentin, haldol, zyprexa   AKI on CKDIIIa: baseline Cr is 1.3. Cr continues to trend up   Hyperammoniemia: etiology unclear. Will continue to monitor  A. fib: w/ RVR. Likely PAF. Continue to hold eliquis for possible DIC. Hold home dose of amiodarone secondary to severe transaminitis.  Continue on tele   Elevated troponin: likely secondary to demand ischemia. Troponin I trending down yesterday. Echo shows EF 40-45%, mildly decreased LV which very similar to pt's echo in June 2021. Cardio consulted   DM2: poorly controlled. Continue on SSI w/ accuchecks    HTN: not on any anti-HTN meds as per med rec   Chronic systolic CHF: continue to hold home dose of lasix. Monitor I/Os. Not on BB, ARB/ACE-I    Chronic anemia: will transfuse if Hb <7.0. Will continue to monitor    DVT prophylaxis: SCDs Code Status: DNR Family Communication:  Disposition Plan: likely d/c to SNF  Status is: Inpatient  Remains inpatient appropriate because:Ongoing diagnostic testing needed not appropriate for outpatient work up, IV treatments appropriate due to intensity of illness or inability to take PO and Inpatient level of care appropriate due to severity of illness   Dispo: The patient is from: home               Anticipated d/c is to: SNF  Anticipated d/c date is: > 3 days              Patient currently is not medically stable to d/c.   Consultants:  ID Podiatry  Vascular surg Hematology    Procedures:    Antimicrobials:   Subjective: Pt is lethargic but c/o being cold.  Objective: Vitals:   03/31/2020 1700 03/03/2020 2300 03/12/20 0100  03/12/20 0700  BP: 106/71 (!) 106/51 112/70   Pulse: 78 84 85 88  Resp: (!) 21 20 (!) 25 (!) 35  Temp:  99.8 F (37.7 C)    TempSrc:  Oral    SpO2: 99% 100% 100% 99%  Weight:      Height:        Intake/Output Summary (Last 24 hours) at 03/12/2020 0757 Last data filed at 03/12/2020 0500 Gross per 24 hour  Intake 1596.11 ml  Output 870 ml  Net 726.11 ml   Filed Weights   03/02/2020 1707 03/10/20 1617  Weight: 79.4 kg 81.1 kg    Examination:  General exam: Appears lethargic Respiratory system: decreased breath sounds b/l  Cardiovascular system: S1/S2+. No rubs or gallops Gastrointestinal system: Abdomen is nondistended, soft and nontender. Hypoactive bowel sounds heard. Central nervous system: Appears lethargic. Moves all 4 extremities  Extremities: left great toe amputation that is dressed. Right transmetatarsal amputation. Petechiae on LLE  Psychiatry: Judgement and insight appear abnormal.      Data Reviewed: I have personally reviewed following labs and imaging studies  CBC: Recent Labs  Lab 03/06/2020 1834 03/10/20 0500 03/14/2020 0444 03/12/20 0338  WBC 21.7* 27.6* 22.6* 22.9*  HGB 8.3* 7.8* 8.2* 9.0*  HCT 27.7* 25.6* 27.0* 31.3*  MCV 85.5 84.2 84.4 86.2  PLT 284 186 129* 75*   Basic Metabolic Panel: Recent Labs  Lab 03/10/2020 1755 03/10/20 0500 03/10/2020 0444 03/12/20 0338  NA 134* 133* 135 139  K 4.5 3.7 3.8 3.9  CL 98 104 105 109  CO2 16* 17* 20* 17*  GLUCOSE 170* 172* 174* 138*  BUN 23 25* 36* 49*  CREATININE 1.86* 1.62* 1.68* 1.84*  CALCIUM 9.0 7.8* 7.6* 7.8*   GFR: Estimated Creatinine Clearance: 39 mL/min (A) (by C-G formula based on SCr of 1.84 mg/dL (H)). Liver Function Tests: Recent Labs  Lab 03/20/2020 1755 03/10/20 0500 03/14/2020 0444 03/12/20 0338  AST 46* 228* 2,306* 4,704*  ALT 48* 153* 1,641* 3,337*  ALKPHOS 108 90 92 104  BILITOT 1.4* 1.4* 1.7* 2.0*  PROT 9.6* 7.9 8.2* 8.1  ALBUMIN 3.0* 2.5* 2.6* 2.5*   No results for  input(s): LIPASE, AMYLASE in the last 168 hours. Recent Labs  Lab 03/04/2020 1755  AMMONIA 50*   Coagulation Profile: Recent Labs  Lab 03/02/2020 1755 03/10/20 0500  INR 1.8* 2.7*   Cardiac Enzymes: No results for input(s): CKTOTAL, CKMB, CKMBINDEX, TROPONINI in the last 168 hours. BNP (last 3 results) No results for input(s): PROBNP in the last 8760 hours. HbA1C: No results for input(s): HGBA1C in the last 72 hours. CBG: Recent Labs  Lab 03/31/2020 1933 03/14/2020 2017 03/12/20 0024 03/12/20 0411 03/12/20 0730  GLUCAP 174* 162* 131* 133* 103*   Lipid Profile: No results for input(s): CHOL, HDL, LDLCALC, TRIG, CHOLHDL, LDLDIRECT in the last 72 hours. Thyroid Function Tests: Recent Labs    03/21/2020 1755  TSH 1.952   Anemia Panel: No results for input(s): VITAMINB12, FOLATE, FERRITIN, TIBC, IRON, RETICCTPCT in the last 72 hours. Sepsis Labs: Recent Labs  Lab 03/05/2020 1755 03/02/2020 2000 03/02/2020  0444 03/12/20 0338  PROCALCITON <0.10  --  28.61 21.04  LATICACIDVEN 11.0* 2.9*  --   --     Recent Results (from the past 240 hour(s))  Aerobic/Anaerobic Culture (surgical/deep wound)     Status: None (Preliminary result)   Collection Time: 03/11/2020 12:36 PM   Specimen: Wound  Result Value Ref Range Status   Specimen Description   Final    WOUND Performed at [facility], [address] [place], [place] [address]    Special Requests   Final    NONE Performed at [facility], [address] [place], [place] [address]    Gram Stain   Final    FEW WBC PRESENT, PREDOMINANTLY MONONUCLEAR FEW GRAM POSITIVE COCCI IN PAIRS IN CLUSTERS    Culture   Final    TOO YOUNG TO READ Performed at [facility], [place] [address], [place], [place] [address]    Report Status PENDING  Incomplete  Blood Culture (routine x 2)     Status: Abnormal (Preliminary result)   Collection Time: 03/18/2020  5:55 PM   Specimen: BLOOD  Result Value Ref Range Status    Specimen Description   Final    BLOOD RIGHT ANTECUBITAL Performed at [facility], [address] [place] [place], [place] [address]    Special Requests   Final    BOTTLES DRAWN AEROBIC AND ANAEROBIC Blood Culture results may not be optimal due to an excessive volume of blood received in culture bottles Performed at [facility], [address] [place], [place] [address]    Culture  Setup Time IN BOTH AEROBIC AND ANAEROBIC BOTTLES  Final   Culture (A)  Final    STAPHYLOCOCCUS AUREUS SUSCEPTIBILITIES PERFORMED ON PREVIOUS CULTURE WITHIN THE LAST 5 DAYS. Performed at [facility], [place] [address], [place], [place] [address]    Report Status PENDING  Incomplete  Blood Culture (routine x 2)     Status: Abnormal   Collection Time: 03/29/2020  5:55 PM   Specimen: BLOOD  Result Value Ref Range Status   Specimen Description   Final    BLOOD LEFT ANTECUBITAL Performed at [facility], [address] [place], [place] [address]    Special Requests   Final    BOTTLES DRAWN AEROBIC AND ANAEROBIC Blood Culture results may not be optimal due to an excessive volume of blood received in culture bottles Performed at [facility], [address] [place], [place] [address]    Culture  Setup Time   Final    ANAEROBIC BOTTLE ONLY GRAM POSITIVE COCCI AEROBIC BOTTLE ONLY CRITICAL RESULT CALLED TO, READ BACK BY AND VERIFIED WITH: ALEX CHAPPELL AT 1017 0/9/21 SDR Performed at [facility], [place] [address], [place], [place] [address]    Culture METHICILLIN RESISTANT STAPHYLOCOCCUS AUREUS (A)  Final   Report Status 03/12/2020 FINAL  Final   Organism ID, Bacteria METHICILLIN RESISTANT STAPHYLOCOCCUS AUREUS  Final      Susceptibility   Methicillin resistant staphylococcus aureus - MIC*    CIPROFLOXACIN >=8 RESISTANT Resistant     ERYTHROMYCIN >=8 RESISTANT Resistant     GENTAMICIN <=0.5 SENSITIVE Sensitive     OXACILLIN >=4 RESISTANT Resistant      TETRACYCLINE <=1 SENSITIVE Sensitive     VANCOMYCIN <=0.5 SENSITIVE Sensitive     TRIMETH/SULFA <=10 SENSITIVE Sensitive     CLINDAMYCIN >=8 RESISTANT Resistant     RIFAMPIN <=0.5 SENSITIVE Sensitive  Inducible Clindamycin NEGATIVE Sensitive     * METHICILLIN RESISTANT STAPHYLOCOCCUS AUREUS  Blood Culture ID Panel (Reflexed)     Status: Abnormal   Collection Time: 03/23/2020  5:55 PM  Result Value Ref Range Status   Enterococcus faecalis NOT DETECTED NOT DETECTED Final   Enterococcus Faecium NOT DETECTED NOT DETECTED Final   Listeria monocytogenes NOT DETECTED NOT DETECTED Final   Staphylococcus species DETECTED (A) NOT DETECTED Final    Comment: CRITICAL RESULT CALLED TO, READ BACK BY AND VERIFIED WITH:  ALEX CHAPPELL AT 1017 03/10/20 SDR    Staphylococcus aureus (BCID) DETECTED (A) NOT DETECTED Final    Comment: Methicillin (oxacillin)-resistant Staphylococcus aureus (MRSA). MRSA is predictably resistant to beta-lactam antibiotics (except ceftaroline). Preferred therapy is vancomycin unless clinically contraindicated. Patient requires contact precautions if  hospitalized. CRITICAL RESULT CALLED TO, READ BACK BY AND VERIFIED WITH:  ALEX CHAPPELL AT 1017 03/10/20 SDR    Staphylococcus epidermidis NOT DETECTED NOT DETECTED Final   Staphylococcus lugdunensis NOT DETECTED NOT DETECTED Final   Streptococcus species NOT DETECTED NOT DETECTED Final   Streptococcus agalactiae NOT DETECTED NOT DETECTED Final   Streptococcus pneumoniae NOT DETECTED NOT DETECTED Final   Streptococcus pyogenes NOT DETECTED NOT DETECTED Final   A.calcoaceticus-baumannii NOT DETECTED NOT DETECTED Final   Bacteroides fragilis NOT DETECTED NOT DETECTED Final   Enterobacterales NOT DETECTED NOT DETECTED Final   Enterobacter cloacae complex NOT DETECTED NOT DETECTED Final   Escherichia coli NOT DETECTED NOT DETECTED Final   Klebsiella aerogenes NOT DETECTED NOT DETECTED Final   Klebsiella oxytoca NOT DETECTED NOT  DETECTED Final   Klebsiella pneumoniae NOT DETECTED NOT DETECTED Final   Proteus species NOT DETECTED NOT DETECTED Final   Salmonella species NOT DETECTED NOT DETECTED Final   Serratia marcescens NOT DETECTED NOT DETECTED Final   Haemophilus influenzae NOT DETECTED NOT DETECTED Final   Neisseria meningitidis NOT DETECTED NOT DETECTED Final   Pseudomonas aeruginosa NOT DETECTED NOT DETECTED Final   Stenotrophomonas maltophilia NOT DETECTED NOT DETECTED Final   Candida albicans NOT DETECTED NOT DETECTED Final   Candida auris NOT DETECTED NOT DETECTED Final   Candida glabrata NOT DETECTED NOT DETECTED Final   Candida krusei NOT DETECTED NOT DETECTED Final   Candida parapsilosis NOT DETECTED NOT DETECTED Final   Candida tropicalis NOT DETECTED NOT DETECTED Final   Cryptococcus neoformans/gattii NOT DETECTED NOT DETECTED Final   Meth resistant mecA/C and MREJ DETECTED (A) NOT DETECTED Final    Comment: CRITICAL RESULT CALLED TO, READ BACK BY AND VERIFIED WITH:  ALEX CHAPPELL AT 1017 03/10/20 SDR Performed at [facility], [address] [place], [place] [address]   Respiratory Panel by RT PCR (Flu A&B, Covid) - Nasopharyngeal Swab     Status: None   Collection Time: 03/03/2020  6:21 PM   Specimen: Nasopharyngeal Swab  Result Value Ref Range Status   SARS Coronavirus 2 by RT PCR NEGATIVE NEGATIVE Final    Comment: (NOTE) SARS-CoV-2 target nucleic acids are NOT DETECTED.  The SARS-CoV-2 RNA is generally detectable in upper respiratoy specimens during the acute phase of infection. The lowest concentration of SARS-CoV-2 viral copies this assay can detect is 131 copies/mL. A negative result does not preclude SARS-Cov-2 infection and should not be used as the sole basis for treatment or other patient management decisions. A negative result may occur with  improper specimen collection/handling, submission of specimen other than nasopharyngeal swab, presence of viral mutation(s) within  the areas targeted by this assay, and  inadequate number of viral copies (<131 copies/mL). A negative result must be combined with clinical observations, patient history, and epidemiological information. The expected result is Negative.  Fact Sheet for Patients:  PinkCheek.be  Fact Sheet for Healthcare Providers:  GravelBags.it  This test is no t yet approved or cleared by the [place] FDA and  has been authorized for detection and/or diagnosis of SARS-CoV-2 by FDA under an Emergency Use Authorization (EUA). This EUA will remain  in effect (meaning this test can be used) for the duration of the COVID-19 declaration under Section 564(b)(1) of the Act, 21 U.S.C. section 360bbb-3(b)(1), unless the authorization is terminated or revoked sooner.     Influenza A by PCR NEGATIVE NEGATIVE Final   Influenza B by PCR NEGATIVE NEGATIVE Final    Comment: (NOTE) The Xpert Xpress SARS-CoV-2/FLU/RSV assay is intended as an aid in  the diagnosis of influenza from Nasopharyngeal swab specimens and  should not be used as a sole basis for treatment. Nasal washings and  aspirates are unacceptable for Xpert Xpress SARS-CoV-2/FLU/RSV  testing.  Fact Sheet for Patients: PinkCheek.be  Fact Sheet for Healthcare Providers: GravelBags.it  This test is not yet approved or cleared by the [place] FDA and  has been authorized for detection and/or diagnosis of SARS-CoV-2 by  FDA under an Emergency Use Authorization (EUA). This EUA will remain  in effect (meaning this test can be used) for the duration of the  Covid-19 declaration under Section 564(b)(1) of the Act, 21  U.S.C. section 360bbb-3(b)(1), unless the authorization is  terminated or revoked. Performed at [facility], [address] [place], [place] [address]   Urine culture     Status: None   Collection Time:  03/19/2020  9:06 PM   Specimen: In/Out Cath Urine  Result Value Ref Range Status   Specimen Description   Final    IN/OUT CATH URINE Performed at [facility], [address], [place], [place] [address]    Special Requests   Final    NONE Performed at [facility], [address], [place], [place] [address]    Culture   Final    NO GROWTH Performed at [facility], [place] [address], [place], [place] [address]    Report Status 03/15/2020 FINAL  Final  Aerobic/Anaerobic Culture (surgical/deep wound)     Status: None (Preliminary result)   Collection Time: 03/10/20 10:55 AM   Specimen: Tissue; Wound  Result Value Ref Range Status   Specimen Description   Final    TISSUE Performed at [facility], [address] [place], [place] [address]    Special Requests   Final    LEFT PARTIAL FIRST RAY AMPUTATION Performed at [facility], [place], [place] [address]    Gram Stain   Final    RARE WBC PRESENT, PREDOMINANTLY PMN RARE GRAM POSITIVE COCCI Performed at [facility], [place] [address], [place],  [address]    Culture PENDING  Incomplete   Report Status PENDING  Incomplete  MRSA PCR Screening     Status: Abnormal   Collection Time: 03/10/20  4:21 PM   Specimen: Nasopharyngeal  Result Value Ref Range Status   MRSA by PCR POSITIVE (A) NEGATIVE Final    Comment:        The GeneXpert MRSA Assay (FDA approved for NASAL specimens only), is one component of a comprehensive MRSA colonization surveillance program. It is not intended to diagnose MRSA  infection nor to guide or monitor treatment for MRSA infections. RESULT CALLED TO, READ BACK BY AND VERIFIED WITH:  [facility] HARRIS AT 1729 03/10/20 SDR Performed at [facility], [address] [place], [place] [address]   CULTURE, BLOOD (ROUTINE X 2) w Reflex to ID Panel     Status: None (Preliminary result)   Collection Time: 03/19/2020  2:50 PM    Specimen: BLOOD  Result Value Ref Range Status   Specimen Description BLOOD BLOOD RIGHT HAND  Final   Special Requests   Final    BOTTLES DRAWN AEROBIC AND ANAEROBIC Blood Culture adequate volume   Culture   Final    NO GROWTH < 24 HOURS Performed at [facility], [address], [place], [place] [address]    Report Status PENDING  Incomplete  CULTURE, BLOOD (ROUTINE X 2) w Reflex to ID Panel     Status: None (Preliminary result)   Collection Time: 03/03/2020  2:59 PM   Specimen: BLOOD  Result Value Ref Range Status   Specimen Description BLOOD BLOOD RIGHT HAND  Final   Special Requests   Final    BOTTLES DRAWN AEROBIC AND ANAEROBIC Blood Culture adequate volume   Culture   Final    NO GROWTH < 24 HOURS Performed at [facility], [address] [place], [place] [address]    Report Status PENDING  Incomplete         Radiology Studies: DG Foot Complete Left  Result Date: 03/10/2020 CLINICAL DATA:  [place] EXAM: LEFT FOOT - COMPLETE 3+ VIEW COMPARISON:  January 13, 2020 FINDINGS: Status post amputation of LEFT great toe. Osteopenia. There is a new erosion of the medial aspect of the first metatarsal. There is increased lucency along medial aspect of the first metatarsal. The asymmetric sclerosis of the first metatarsal head, similar comparison to prior. Increased irregularity and cortical indistinctness of the lateral hallux sesamoid. Vascular calcifications. Scattered midfoot degenerative changes. Enthesophyte of the Achilles tendon. IMPRESSION: 1. New erosion of the medial aspect of the first metatarsal with increased lucency along the medial aspect of the first metatarsal, concerning for osteomyelitis. 2. Increased irregularity and cortical indistinctness of the lateral hallux sesamoid, suspicious for osteomyelitis. Electronically Signed   By: Valentino Saxon MD   On: 03/10/2020 14:20   ECHOCARDIOGRAM COMPLETE  Result Date: 03/10/2020    ECHOCARDIOGRAM REPORT    Patient Name:   MALIIK KARNER Date of Exam: 03/10/2020 Medical Rec #:  [id number]           Height:       69.0 in Accession #:    [id number]          Weight:       175.0 lb Date of Birth:  09-20-1950           BSA:          1.952 m Patient Age:    10 years            BP:           81/60 mmHg Patient Gender: M                   HR:           85 bpm. Exam Location:  [facility] Procedure: 2D Echo, Cardiac Doppler and Color Doppler Indications:     Abnormal ECG 794.31  History:         Patient has prior history of Echocardiogram examinations, most  recent 11/15/2019. Stroke; Risk Factors:Hypertension. PVD.  Sonographer:     Sherrie Sport RDCS (AE) Referring Phys:  [id number] Wyvonnia Dusky Diagnosing Phys: Ida Rogue MD  Sonographer Comments: Image acquisition challenging due to uncooperative patient. IMPRESSIONS  1. Limited study secondary to patient terminating study prematurely.  2. Left ventricular ejection fraction could not be accurately estimated, grossly 40 to 45%. The left ventricle has mildly decreased function. Unable to exclude anteroseptal hypokinesis.  3. Right ventricle is not well visualized. Right ventricular systolic function is grossly mildly reduced. The right ventricular size is normal.  4. Left atrial size was mildly dilated.  5. The mitral valve is normal in structure. Mild mitral valve regurgitation.  6. The aortic valve was not well visualized. Aortic valve regurgitation is not visualized. FINDINGS  Left Ventricle: Left ventricular ejection fraction, by estimation, is 40 to 45%. The left ventricle has mildly decreased function. The left ventricular internal cavity size was normal in size. There is no left ventricular hypertrophy. Left ventricular diastolic parameters are indeterminate. Right Ventricle: The right ventricular size is normal. No increase in right ventricular wall thickness. Right ventricular systolic function is mildly reduced. Left Atrium: Left atrial size was  mildly dilated. Right Atrium: Right atrial size was normal in size. Pericardium: The pericardium was not well visualized. Mitral Valve: The mitral valve is normal in structure. Mild mitral valve regurgitation. Tricuspid Valve: The tricuspid valve is not well visualized. Tricuspid valve regurgitation is not demonstrated. Aortic Valve: The aortic valve was not well visualized. Aortic valve regurgitation is not visualized. Pulmonic Valve: The pulmonic valve was not well visualized. Pulmonic valve regurgitation is not visualized. Aorta: The aortic root was not well visualized and the ascending aorta was not well visualized. IAS/Shunts: The interatrial septum was not assessed. Additional Comments: A pacer wire is visualized.  LEFT VENTRICLE PLAX 2D LVIDd:         4.94 cm LVIDs:         4.65 cm LV PW:         1.06 cm LV IVS:        1.03 cm LVOT diam:     2.00 cm LVOT Area:     3.14 cm  LEFT ATRIUM         Index LA diam:    4.60 cm 2.36 cm/m   AORTA Ao Root diam: 3.40 cm  SHUNTS Systemic Diam: 2.00 cm Ida Rogue MD Electronically signed by Ida Rogue MD Signature Date/Time: 03/10/2020/4:03:17 PM    Final    US Abdomen Limited RUQ  Result Date: 03/26/2020 CLINICAL DATA:  Elevated liver enzymes. EXAM: ULTRASOUND ABDOMEN LIMITED RIGHT UPPER QUADRANT COMPARISON:  None. FINDINGS: Gallbladder: No gallstones or wall thickening visualized. No sonographic Murphy sign noted by sonographer. Common bile duct: Diameter: 3 mm, within normal limits. Liver: No focal lesion identified. Within normal limits in parenchymal echogenicity. Portal vein is patent on color Doppler imaging with normal direction of blood flow towards the liver. Other: Right pleural effusion incidentally noted. IMPRESSION: No hepatobiliary abnormality identified. Right pleural effusion incidentally noted. Electronically Signed   By: Marlaine Hind M.D.   On: 03/21/2020 19:31        Scheduled Meds: . amiodarone  200 mg Oral Daily  . aspirin EC  81  mg Oral Daily  . atorvastatin  40 mg Oral QHS  . Chlorhexidine Gluconate Cloth  6 each Topical Daily  . cholecalciferol  1,000 Units Oral Daily  . DULoxetine  20 mg Oral Daily  .  famotidine  20 mg Oral Daily  . insulin aspart  0-9 Units Subcutaneous Q4H  . methylPREDNISolone (SOLU-MEDROL) injection  40 mg Intravenous Q12H  . mupirocin ointment  1 application Nasal BID  . pantoprazole (PROTONIX) IV  40 mg Intravenous Q24H   Continuous Infusions: . sodium chloride 100 mL/hr at 03/12/20 0500  . sodium chloride Stopped (03/10/20 0854)  . norepinephrine (LEVOPHED) Adult infusion Stopped (03/10/20 1420)  . vancomycin 750 mg (03/12/20 0615)     LOS: 3 days    Time spent: 31 mins     Wyvonnia Dusky, MD [facility] Pager [phone]  If 7PM-7AM, please contact night-coverage [email or web address] 03/12/2020, 7:57 AM

## 2020-03-12 NOTE — Consult Note (Signed)
Hematology/Oncology Consult note [facility] Telephone:([phone] [phone]  Patient Care Team: [facility], [facility] as PCP - General   Name of the patient: Jason Montes  [id number]  11/11/1950    Reason for consult.  Evaluate for possible DIC   Requesting physician: Dr. Tommie Ard  Date of visit: 03/12/2020    History of presenting illness- patient is a 69 year old male with a past medical history significant for CABG, coronary artery disease, type 2 diabetes complicated by neuropathy, CKD CVA who has been admitted to the hospital for septic shock secondary to MRSA infection.  He had to undergo amputation of the infected left great toe on 01/13/2020 as well as angioplasty of the left tibial artery and peroneal artery in August 2021.  This time he was admitted to the hospital with symptoms of altered mental status.  Lactate was elevated at 11 with a creatinine of 1.8 and white count of 21.7 blood cultures positive for MRSA.Hematology consulted for possible DIC.  INR elevated at 5.6 aPTT elevated at 40 fibrinogen normal at 341 LDH elevated at 2000 278.  He has evidence of acute liver failure with AST ALT at 4000 703,000 337 with a total bilirubin of 2.  Pro calcitonin elevated at 21  Patient is currently delirious and cannot give any history     Review of systems- Review of Systems  Unable to perform ROS: Mental acuity    Allergies  Allergen Reactions  . Amlodipine Other (See Comments)  . Penicillins     Childhood allergy, not sure what happens  Other reaction(s): Other, Other  . Norvasc [Amlodipine Besylate]     Unknown    Patient Active Problem List   Diagnosis Date Noted  . Atherosclerosis of native arteries of the extremities with ulceration ([place])   . Hyperammonemia ([place]) 03/26/2020  . Diabetic foot ulcer associated with type 2 diabetes mellitus ([place]) 03/25/2020  . Severe sepsis with acute organ dysfunction ([place])  03/28/2020  . Elevated troponin 03/03/2020  . Chronic anemia 03/06/2020  . Severe sepsis ([place]) 03/10/2020  . Abnormal LFTs 03/03/2020  . Decubitus ulcer 02/16/2020  . Suicidal ideation   . Acute blood loss anemia   . Acute lower UTI   . Thrombocytopenia ([place])   . Hyponatremia   . Post-operative pain   . Benign essential HTN   . Diabetic peripheral neuropathy ([place])   . Labile blood glucose   . Chronic systolic congestive heart failure ([place])   . Urinary retention   . Encounter for psychological evaluation 02/01/2020  . Aspiration pneumonia ([place]) 01/31/2020  . Wernicke encephalopathy 01/31/2020  . Acute urinary retention 01/28/2020  . Hypotension 01/28/2020  . Nonsustained ventricular tachycardia ([place]) 01/28/2020  . Pressure injury of skin 01/20/2020  . Acute metabolic encephalopathy 77/82/4235  . Osteomyelitis of great toe of left foot ([place]) 01/11/2020  . Type II diabetes mellitus with renal manifestations ([place]) 01/11/2020  . Stroke ([place]) 01/11/2020  . Depression 01/11/2020  . Suicidal behavior 01/11/2020  . Subacute osteomyelitis of left foot ([place])   . Intertrochanteric fracture of right femur, closed, initial encounter ([place]) 11/15/2019  . Accidental fall 11/15/2019  . Preoperative clearance 11/15/2019  . Closed right hip fracture, initial encounter ([place]) 11/15/2019  . Diabetic foot infection ([place])   . Stage 3a chronic kidney disease ([place])   . Cellulitis of left foot 10/07/2019  . Protein-calorie malnutrition, severe ([place]) 03/13/2019  . Acute renal failure superimposed on stage 3a chronic kidney disease ([place]) 03/11/2019  .  Family history of malignant neoplasm of gastrointestinal tract   . Benign neoplasm of transverse colon   . Benign neoplasm of cecum   . Benign neoplasm of ascending colon   . Benign neoplasm of descending colon   . Polyp of sigmoid colon   . Atrial fibrillation ([place]) 02/25/2018  . Vitamin B12 deficiency anemia 02/25/2018  . CKD (chronic kidney  disease) stage 2, GFR 60-89 ml/min 02/25/2018  . CAD (coronary artery disease) 02/25/2018  . DM (diabetes mellitus), type 2, uncontrolled with complications ([place]) 09/38/1829  . GERD with esophagitis 02/25/2018  . Generalized muscle weakness 02/25/2018  . Chronic systolic CHF (congestive heart failure) ([place]) 02/25/2018  . Chronic anticoagulation 02/25/2018  . 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 Hyperlipidemia 02/25/2018  . Essential hypertension 02/25/2018  . Hypokalemia 02/25/2018  . Mild neurocognitive disorder 02/25/2018  . Osteoarthritis 02/25/2018  . PVD (peripheral vascular disease) ([place]) 02/25/2018  . Presbyopia 02/25/2018  . Restless leg syndrome 02/25/2018  . History of stroke with current residual effects 02/25/2018  . Subacute osteomyelitis of right foot ([place]) 02/25/2018  . Tinea unguium 02/25/2018     Past Medical History:  Diagnosis Date  . Allergy   . Atrial fibrillation ([place])   . B12 deficiency   . Chronic kidney disease   . Chronic kidney disease (CKD), stage II (mild)   . Coronary atherosclerosis   . Diabetes mellitus without complication ([place])   . Elevated PSA   . Esophagitis   . GERD (gastroesophageal reflux disease)   . Heart attack ([place])   . Heart disease   . Hyperlipidemia   . Hypertension   . Hypokalemia   . Mild neurocognitive disorder   . Osteoarthritis   . Presbyopia   . PVD (peripheral vascular disease) ([place])   . Restless leg syndrome   . Stroke (cerebrum) ([place])   . Stroke ([place])   . Subacute osteomyelitis ([place])   . Tinea unguium   . Uncompensated short term memory deficit      Past Surgical History:  Procedure Laterality Date  . AMPUTATION Left 03/06/2020   Procedure: LEFT PARTIAL FIRST  RAY AMPUTATION;  Surgeon: Caroline More, DPM;  Location: [facility] ORS;  Service: Podiatry;  Laterality: Left;  . AMPUTATION TOE Left 01/13/2020   Procedure: AMPUTATION RAY LEFT 1ST;  Surgeon: Caroline More, DPM;  Location: [facility] ORS;  Service:  Podiatry;  Laterality: Left;  . CARDIAC PACEMAKER PLACEMENT    . COLONOSCOPY WITH PROPOFOL N/A 05/18/2018   Procedure: COLONOSCOPY WITH PROPOFOL;  Surgeon: [name], MD;  Location: [facility];  Service: Endoscopy;  Laterality: N/A;  . CORONARY ARTERY BYPASS GRAFT    . FOOT AMPUTATION Right   . INTRAMEDULLARY (IM) NAIL INTERTROCHANTERIC Right 11/16/2019   Procedure: INTRAMEDULLARY (IM) NAIL INTERTROCHANTRIC;  Surgeon: Thornton Park, MD;  Location: [facility] ORS;  Service: Orthopedics;  Laterality: Right;  . LOWER EXTREMITY ANGIOGRAPHY Left 10/10/2019   Procedure: Lower Extremity Angiography;  Surgeon: Algernon Huxley, MD;  Location: [facility];  Service: Cardiovascular;  Laterality: Left;  . LOWER EXTREMITY ANGIOGRAPHY Left 01/16/2020   Procedure: Lower Extremity Angiography;  Surgeon: Algernon Huxley, MD;  Location: [place] [facility];  Service: Cardiovascular;  Laterality: Left;    Social History   Socioeconomic History  . Marital status: Single    Spouse name: Not on file  . Number of children: Not on file  . Years of education: Not on file  . Highest education level: Not on file  Occupational  History  . Not on file  Tobacco Use  . Smoking status: Never Smoker  . Smokeless tobacco: Never Used  Vaping Use  . Vaping Use: Never used  Substance and Sexual Activity  . Alcohol use: Never  . Drug use: Never  . Sexual activity: Not on file  Other Topics Concern  . Not on file  Social History Narrative  . Not on file   Social Determinants of Health   Financial Resource Strain:   . Difficulty of Paying Living Expenses: Not on file  Food Insecurity:   . Worried About Charity fundraiser in the Last Year: Not on file  . Ran Out of Food in the Last Year: Not on file  Transportation Needs:   . Lack of Transportation (Medical): Not on file  . Lack of Transportation (Non-Medical): Not on file  Physical Activity:   . Days of Exercise per Week: Not on file  . Minutes of  Exercise per Session: Not on file  Stress:   . Feeling of Stress : Not on file  Social Connections:   . Frequency of Communication with Friends and Family: Not on file  . Frequency of Social Gatherings with Friends and Family: Not on file  . Attends Religious Services: Not on file  . Active Member of Clubs or Organizations: Not on file  . Attends Archivist Meetings: Not on file  . Marital Status: Not on file  Intimate Partner Violence:   . Fear of Current or Ex-Partner: Not on file  . Emotionally Abused: Not on file  . Physically Abused: Not on file  . Sexually Abused: Not on file     Family History  Problem Relation Age of Onset  . Heart failure Mother   . Colon cancer Mother   . Diabetes Mother   . Thyroid disease Sister      Current Facility-Administered Medications:  .  0.9 %  sodium chloride infusion, , Intravenous, Continuous, Caroline More, DPM, Last Rate: 100 mL/hr at 03/12/20 1234, New Bag at 03/12/20 1234 .  0.9 %  sodium chloride infusion, 250 mL, Intravenous, Continuous, Caroline More, DPM, Stopped at 03/10/20 [phone] .  acetaminophen (TYLENOL) tablet 650 mg, 650 mg, Oral, Q6H PRN **OR** acetaminophen (TYLENOL) suppository 650 mg, 650 mg, Rectal, Q6H PRN, Caroline More, DPM .  aspirin EC tablet 81 mg, 81 mg, Oral, Daily, Baker, Andrew, DPM .  Chlorhexidine Gluconate Cloth 2 % PADS 6 each, 6 each, Topical, Daily, Caroline More, DPM .  cholecalciferol (VITAMIN D) tablet 1,000 Units, 1,000 Units, Oral, Daily, Caroline More, DPM .  DULoxetine (CYMBALTA) DR capsule 20 mg, 20 mg, Oral, Daily, Baker, Andrew, DPM .  famotidine (PEPCID) tablet 20 mg, 20 mg, Oral, Daily, Baker, Andrew, DPM .  insulin aspart (novoLOG) injection 0-9 Units, 0-9 Units, Subcutaneous, Q4H, Caroline More, DPM, 1 Units at 03/12/20 0430 .  LORazepam (ATIVAN) injection 1 mg, 1 mg, Intravenous, Q6H PRN, Caroline More, DPM, 1 mg at 03/12/20 1434 .  methylPREDNISolone sodium succinate (SOLU-MEDROL)  40 mg/mL injection 40 mg, 40 mg, Intravenous, Q12H, Caroline More, DPM, 40 mg at 03/12/20 1128 .  mupirocin ointment (BACTROBAN) 2 % 1 application, 1 application, Nasal, BID, Caroline More, DPM, 1 application at 96/04/54 0981 .  norepinephrine (LEVOPHED) 4mg  in 29mL premix infusion, 2-10 mcg/min, Intravenous, Titrated, Caroline More, DPM, Paused at 03/10/20 1420 .  ondansetron (ZOFRAN) tablet 4 mg, 4 mg, Oral, Q6H PRN **OR** ondansetron (ZOFRAN) injection 4 mg, 4 mg, Intravenous, Q6H  PRN, Caroline More, DPM .  pantoprazole (PROTONIX) injection 40 mg, 40 mg, Intravenous, Q24H, Caroline More, DPM, 40 mg at 03/10/2020 2037 .  [START ON 03/13/2020] vancomycin (VANCOCIN) IVPB 1000 mg/200 mL premix, 1,000 mg, Intravenous, Q24H, Dallie Piles, [facility]   Physical exam:  Vitals:   03/12/20 1100 03/12/20 1200 03/12/20 1300 03/12/20 1400  BP:  (!) 110/50    Pulse:  (!) 29 (!) 30 87  Resp: 20 (!) 27 (!) 24 17  Temp:      TempSrc:      SpO2:  94% 97% 98%  Weight:      Height:       Physical Exam Constitutional:      Comments: He has acute delirium  Cardiovascular:     Rate and Rhythm: Normal rate and regular rhythm.     Heart sounds: Normal heart sounds.  Pulmonary:     Effort: Pulmonary effort is normal.     Breath sounds: Normal breath sounds.  Abdominal:     General: Bowel sounds are normal.     Palpations: Abdomen is soft.     Comments: Foley draining clear urine  Musculoskeletal:     Comments: Dressing in place over LLE. B/l scattered tiny scab marks/ petechiae seen over b/l LE  Skin:    General: Skin is warm and dry.  Neurological:     Mental Status: He is alert.        CMP Latest Ref Rng & Units 03/12/2020  Glucose 70 - 99 mg/dL 138(H)  BUN 8 - 23 mg/dL 49(H)  Creatinine 0.61 - 1.24 mg/dL 1.84(H)  Sodium 135 - 145 mmol/L 139  Potassium 3.5 - 5.1 mmol/L 3.9  Chloride 98 - 111 mmol/L 109  CO2 22 - 32 mmol/L 17(L)  Calcium 8.9 - 10.3 mg/dL 7.8(L)  Total Protein 6.5 - 8.1 g/dL  8.1  Total Bilirubin 0.3 - 1.2 mg/dL 2.0(H)  Alkaline Phos 38 - 126 U/L 104  AST 15 - 41 U/L 4,704(H)  ALT 0 - 44 U/L 3,337(H)   CBC Latest Ref Rng & Units 03/12/2020  WBC 4.0 - 10.5 K/uL 22.9(H)  Hemoglobin 13.0 - 17.0 g/dL 9.0(L)  Hematocrit 39 - 52 % 31.3(L)  Platelets 150 - 400 K/uL 75(L)    @IMAGES @  CT ABDOMEN PELVIS WO CONTRAST  Result Date: 03/19/2020 CLINICAL DATA:  Abnormal LFTs, possible sepsis EXAM: CT ABDOMEN AND PELVIS WITHOUT CONTRAST TECHNIQUE: Multidetector CT imaging of the abdomen and pelvis was performed following the standard protocol without IV contrast. COMPARISON:  CT 04/08/2019 FINDINGS: Motion degraded imaging of the lower chest and upper abdomen. Imaging quality further degraded due to increased image noise from the patient's arms over the upper abdomen. Lower chest: Small bilateral effusions. Adjacent atelectatic changes are present. Some additional heterogenous opacity could reflect further atelectasis at, edema or early consolidation. Evidence of prior sternotomy and CABG. Extensive calcification of the native coronary arteries. Pacer lead terminates at the cardiac apex. No pericardial effusion. Hepatobiliary: No visible focal liver lesion on this unenhanced CT. Some mild perihepatic hazy stranding may be present though this is possibly accentuated by extensive motion artifact and streak in the upper abdomen. No focal liver lesion is seen. High attenuation material seen within the gallbladder lumen, possibly biliary sand versus vicarious excretion of contrast though should correlate for recent contrast enhanced studies, none available at the time of interpretation. No focal pericholecystic inflammation is seen. No biliary ductal dilatation or visible intraductal gallstones. Pancreas: Unremarkable. No pancreatic  ductal dilatation or surrounding inflammatory changes. Spleen: Normal in size. No concerning splenic lesions. Adrenals/Urinary Tract: Normal adrenal glands.  Extensive bilateral perinephric stranding is increased from comparison imaging. No discernible focal renal lesion. Renal vascular calcifications. No urolithiasis or hydronephrosis. Circumferential bladder wall thickening is present with some focal thickening towards the anterosuperior bladder dome, possibly reactive. Stomach/Bowel: Small hiatal hernia. Stomach mildly distended with air and ingested fluid. No significant small bowel thickening or dilatation. Diffuse pancolonic edematous mural thickening with faint hazy pericolonic stranding. No evidence of obstruction. High attenuation fecal material, could reflect ingested bismuth containing products, inspissated stool or previously administered enteric contrast media. Vascular/Lymphatic: Atherosclerotic calcifications within the abdominal aorta and branch vessels. No aneurysm or ectasia. No enlarged abdominopelvic lymph nodes. Some edematous periaortic nodes are present. No pathologically enlarged abdominopelvic lymph nodes. Reproductive: Mild prostatomegaly. Seminal vesicles are unremarkable. Other: Diffuse body wall edema. Edematous changes in the central mesentery. Trace free fluid in the pelvis. No free air. No organized collection or abscess. Tiny fat containing right inguinal hernia. No bowel containing hernias. Musculoskeletal: Multilevel degenerative changes are present in the imaged portions of the spine. Additional moderate degenerative changes in the hips as well as fusion across the bilateral SI joints. Prior sternotomy. Prior right femoral intramedullary nail and transcervical pin. No acute osseous abnormality or suspicious osseous lesion. IMPRESSION: 1. Diffuse pancolonic edematous mural thickening with faint hazy pericolonic stranding, consistent with a nonspecific colitis of infectious or inflammatory etiology. 2. Extensive bilateral perinephric stranding is increased from comparison imaging. This is nonspecific but can be seen in the setting of  ascending urinary tract infection given the circumferential bladder wall thickening as well. Recommend correlation with urinalysis. 3. Small bilateral effusions with adjacent atelectatic changes. Some additional heterogenous opacity could reflect further atelectasis, edema or early consolidation. 4. High attenuation material within the gallbladder lumen, possibly biliary sand versus vicarious excretion of contrast though no recent contrast enhanced study is seen in the patient's chart. Correlate with history. Could consider right upper quadrant ultrasound. 5. Some hazy stranding about the liver, nonspecific and possibly accentuated by motion artifact and image noise though can be seen in the setting of hepatitis, given elevated LFTs. 6. Additional features of anasarca with body wall edema. 7. Aortic Atherosclerosis (ICD10-I70.0). Electronically Signed   By: Lovena Le M.D.   On: 03/05/2020 21:44   CT Head Wo Contrast  Result Date: 03/16/2020 CLINICAL DATA:  69 year old male with altered mental status. EXAM: CT HEAD WITHOUT CONTRAST TECHNIQUE: Contiguous axial images were obtained from the base of the skull through the vertex without intravenous contrast. COMPARISON:  Head CT dated 01/23/2020. FINDINGS: Brain: There is mild age-related atrophy and chronic microvascular ischemic changes. 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 There is no acute intracranial hemorrhage. No mass effect or midline shift. No extra-axial fluid collection. Vascular: No hyperdense vessel or unexpected calcification. Skull: Normal. Negative for fracture or focal lesion. Sinuses/Orbits: No acute finding. Other: None IMPRESSION: 1. No acute intracranial hemorrhage. 2. Mild age-related atrophy and chronic microvascular ischemic changes. 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 Electronically Signed   By: Anner Crete M.D.   On: 03/30/2020 18:03   DG  Chest Port 1 View  Result Date: 03/12/2020 CLINICAL DATA:  Tachypnea. EXAM: PORTABLE CHEST 1 VIEW COMPARISON:  March 09, 2020. FINDINGS: Stable cardiomegaly. Status post coronary bypass graft. Left-sided pacemaker is unchanged in position. No pneumothorax or pleural effusion is noted. Stable  minimal to mild bibasilar opacities are noted concerning for scarring or atelectasis. Bony thorax is unremarkable. IMPRESSION: No active disease. Electronically Signed   By: Marijo Conception M.D.   On: 03/12/2020 08:41   DG Chest Port 1 View  Result Date: 03/23/2020 CLINICAL DATA:  Possible sepsis EXAM: PORTABLE CHEST 1 VIEW COMPARISON:  Radiograph 01/17/2020 FINDINGS: Mild airways thickening with some increasing interstitial and patchy opacities in lung bases and retrocardiac space. Background of chronically coarsened interstitial features. No pneumothorax or visible effusion. Pacer/AICD overlies the left chest wall. Post sternotomy and CABG changes likely with abandoned epicardial pacer leads in the upper abdomen. Chronic cardiomegaly with a calcified aorta, not significantly changed accounting for differences in technique. The osseous structures appear diffusely demineralized which may limit detection of small or nondisplaced fractures. No acute osseous or soft tissue abnormality. Moderate to severe degenerative changes are present in the imaged spine and shoulders. IMPRESSION: 1. Mild airways thickening with some increasing interstitial and patchy opacities in the lung bases and retrocardiac space. Could reflect developing infection or edema. 2. Chronic interstitial changes. 3. Cardiomegaly similar to prior. Prior sternotomy and CABG. 4.  Aortic Atherosclerosis (ICD10-I70.0). Electronically Signed   By: Lovena Le M.D.   On: 03/31/2020 18:20   DG Foot Complete Left  Result Date: 03/12/2020 CLINICAL DATA:  Partial first ray amputation EXAM: LEFT FOOT - COMPLETE 3+ VIEW COMPARISON:  03/10/2020 FINDINGS: Interval  transmetatarsal amputation of the first ray at the level of the proximal metaphysis. Remaining osseous structures appear intact and unchanged in appearance. No additional sites of bony erosion or cortical destruction. Expected postoperative changes within the soft tissues at the resection site. Advanced small vessel vascular calcifications. 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 Electronically Signed   By: Davina Poke D.O.   On: 03/12/2020 08:29   DG Foot Complete Left  Result Date: 03/10/2020 CLINICAL DATA:  [place] EXAM: LEFT FOOT - COMPLETE 3+ VIEW COMPARISON:  January 13, 2020 FINDINGS: Status post amputation of LEFT great toe. Osteopenia. There is a new erosion of the medial aspect of the first metatarsal. There is increased lucency along medial aspect of the first metatarsal. The asymmetric sclerosis of the first metatarsal head, similar comparison to prior. Increased irregularity and cortical indistinctness of the lateral hallux sesamoid. Vascular calcifications. Scattered midfoot degenerative changes. Enthesophyte of the Achilles tendon. IMPRESSION: 1. New erosion of the medial aspect of the first metatarsal with increased lucency along the medial aspect of the first metatarsal, concerning for osteomyelitis. 2. Increased irregularity and cortical indistinctness of the lateral hallux sesamoid, suspicious for osteomyelitis. Electronically Signed   By: Valentino Saxon MD   On: 03/10/2020 14:20   VAS [place] ABI WITH/WO TBI  Result Date: 03/06/2020 LOWER EXTREMITY DOPPLER STUDY Indications: Peripheral artery disease, and S/P Angioplasty.  Vascular Interventions: 10/10/2019: Aortogram and Selective Left Lower Extremity                         Angiogram. PTA of the Left ATA. PTA of the Left Peroneal                         Artery. PTA of the Left Popliteal Artery.                          01/16/2020: Aortogram and Selective Left Lower Extremity  Angiogram including selective images of the Left ATA and                         Peroneal Artery. PTA of the Left Anterior Tibial Artery.                         PTA of the Left Peroneal Artery. Performing Technologist: Almira Coaster RVS  Examination Guidelines: A complete evaluation includes at minimum, Doppler waveform signals and systolic blood pressure reading at the level of bilateral brachial, anterior tibial, and posterior tibial arteries, when vessel segments are accessible. Bilateral testing is considered an integral part of a complete examination. Photoelectric Plethysmograph (PPG) waveforms and toe systolic pressure readings are included as required and additional duplex testing as needed. Limited examinations for reoccurring indications may be performed as noted.  ABI Findings: +---------+------------------+-----+---------+-------------------+ Right    Rt Pressure (mmHg)IndexWaveform Comment             +---------+------------------+-----+---------+-------------------+ Brachial 141                                                 +---------+------------------+-----+---------+-------------------+ ATA      250               1.77 triphasic                    +---------+------------------+-----+---------+-------------------+ PTA      250               1.77 triphasic                    +---------+------------------+-----+---------+-------------------+ Great Toe                                Amputated Half Foot +---------+------------------+-----+---------+-------------------+ +---------+------------------+-----+---------+-------------------------------+ Left     Lt Pressure (mmHg)IndexWaveform Comment                         +---------+------------------+-----+---------+-------------------------------+ ATA      250               1.77 triphasic                                +---------+------------------+-----+---------+-------------------------------+ PTA      250                1.77 triphasic                                +---------+------------------+-----+---------+-------------------------------+ Great Toe                                Great Toe and 2nd Toe Amputated +---------+------------------+-----+---------+-------------------------------+ +-------+-----------+-----------+------------+------------+ ABI/TBIToday's ABIToday's TBIPrevious ABIPrevious TBI +-------+-----------+-----------+------------+------------+ Right  >1.0 [place]                                        +-------+-----------+-----------+------------+------------+ Left   >1.0 [place]                                        +-------+-----------+-----------+------------+------------+  Summary: Right: Resting right ankle-brachial index indicates noncompressible right lower extremity arteries. Left: Resting left ankle-brachial index indicates noncompressible left lower extremity arteries.  *See table(s) above for measurements and observations.  Electronically signed by Leotis Pain MD on 03/06/2020 at 11:40:38 AM.    Final    ECHOCARDIOGRAM COMPLETE  Result Date: 03/10/2020    ECHOCARDIOGRAM REPORT   Patient Name:   JAWAAN ADACHI Date of Exam: 03/10/2020 Medical Rec #:  [id number]           Height:       69.0 in Accession #:    [id number]          Weight:       175.0 lb Date of Birth:  1951-05-05           BSA:          1.952 m Patient Age:    31 years            BP:           81/60 mmHg Patient Gender: M                   HR:           85 bpm. Exam Location:  [facility] Procedure: 2D Echo, Cardiac Doppler and Color Doppler Indications:     Abnormal ECG 794.31  History:         Patient has prior history of Echocardiogram examinations, most                  recent 11/15/2019. Stroke; Risk Factors:Hypertension. PVD.  Sonographer:     Sherrie Sport RDCS (AE) Referring Phys:  [id number] Wyvonnia Dusky Diagnosing Phys: Ida Rogue MD  Sonographer Comments: Image acquisition challenging due to  uncooperative patient. IMPRESSIONS  1. Limited study secondary to patient terminating study prematurely.  2. Left ventricular ejection fraction could not be accurately estimated, grossly 40 to 45%. The left ventricle has mildly decreased function. Unable to exclude anteroseptal hypokinesis.  3. Right ventricle is not well visualized. Right ventricular systolic function is grossly mildly reduced. The right ventricular size is normal.  4. Left atrial size was mildly dilated.  5. The mitral valve is normal in structure. Mild mitral valve regurgitation.  6. The aortic valve was not well visualized. Aortic valve regurgitation is not visualized. FINDINGS  Left Ventricle: Left ventricular ejection fraction, by estimation, is 40 to 45%. The left ventricle has mildly decreased function. The left ventricular internal cavity size was normal in size. There is no left ventricular hypertrophy. Left ventricular diastolic parameters are indeterminate. Right Ventricle: The right ventricular size is normal. No increase in right ventricular wall thickness. Right ventricular systolic function is mildly reduced. Left Atrium: Left atrial size was mildly dilated. Right Atrium: Right atrial size was normal in size. Pericardium: The pericardium was not well visualized. Mitral Valve: The mitral valve is normal in structure. Mild mitral valve regurgitation. Tricuspid Valve: The tricuspid valve is not well visualized. Tricuspid valve regurgitation is not demonstrated. Aortic Valve: The aortic valve was not well visualized. Aortic valve regurgitation is not visualized. Pulmonic Valve: The pulmonic valve was not well visualized. Pulmonic valve regurgitation is not visualized. Aorta: The aortic root was not well visualized and the ascending aorta was not well visualized. IAS/Shunts: The interatrial septum was not assessed. Additional Comments: A pacer wire is visualized.  LEFT VENTRICLE PLAX 2D LVIDd:         4.94 cm LVIDs:  4.65 cm LV PW:          1.06 cm LV IVS:        1.03 cm LVOT diam:     2.00 cm LVOT Area:     3.14 cm  LEFT ATRIUM         Index LA diam:    4.60 cm 2.36 cm/m   AORTA Ao Root diam: 3.40 cm  SHUNTS Systemic Diam: 2.00 cm Ida Rogue MD Electronically signed by Ida Rogue MD Signature Date/Time: 03/10/2020/4:03:17 PM    Final    US Abdomen Limited RUQ  Result Date: 03/20/2020 CLINICAL DATA:  Elevated liver enzymes. EXAM: ULTRASOUND ABDOMEN LIMITED RIGHT UPPER QUADRANT COMPARISON:  None. FINDINGS: Gallbladder: No gallstones or wall thickening visualized. No sonographic Murphy sign noted by sonographer. Common bile duct: Diameter: 3 mm, within normal limits. Liver: No focal lesion identified. Within normal limits in parenchymal echogenicity. Portal vein is patent on color Doppler imaging with normal direction of blood flow towards the liver. Other: Right pleural effusion incidentally noted. IMPRESSION: No hepatobiliary abnormality identified. Right pleural effusion incidentally noted. Electronically Signed   By: Marlaine Hind M.D.   On: 03/29/2020 19:31    Assessment and plan- Patient is a 69 y.o. 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  Hematology consulted for abnormal coags and possible DIC  Patient has evidence of shock liver as evidenced by AST ALT at 3000 and 4000 respectively as well as a total bilirubin of 2.  Procalcitonin elevated at 21.  This is all suggestive of DIC secondary to underlying septic shock.  His INR is elevated at 5.6 likely secondary to vitamin K deficiency in the setting of shock liver.  Recommend giving parenteral vitamin K and monitoring PT INR daily.    Fibrinogen is normal presently and can be monitored every other day as well.  Suspect thrombocytopenia is consumptive in the setting of DIC.  Patient had a normal platelet count on 03/30/2020 and has steadily drifted down to 75.  Continue to monitor.  LDH can  be elevated in acute liver failure and is nonspecific  Suspect petechiae of the lower extremities secondary to underlying DIC.  Unless the underlying septic shock and MRSA does not get better the symptoms are unlikely to improve.  Treatment of DIC would be to treat the underlying cause.  No need to give platelet transfusion as long as the platelet counts are more than 10,000.  If there is evidence of bleeding and platelet count is less than 50 okay to give platelet transfusions.    Continue to monitor fibrinogen every other day.  If there is evidence of bleeding;  Administer cryoprecipitate if fibrinogen levels less than 100. If fibrinogen level more than 100 and PT PTT INR remains elevated okay to give FFP.  This is only if there is evidence of clinical bleeding and not prophylactically.  No role for therapeutic anticoagulation at this time either in the absence of thrombosis.  Both bleeding and thrombosis can be seen in DIC   Visit Diagnosis 1. Sepsis, due to unspecified organism, unspecified whether acute organ dysfunction present ([place])   2. Altered mental status, unspecified altered mental status type   3. Wound of left foot   4. Transaminitis   5. Postop check   6. Tachypnea     Dr. Randa Evens, MD, MPH [facility] at [facility] [phone] 03/12/2020

## 2020-03-12 NOTE — Progress Notes (Signed)
Dr. Jimmye Norman notified of critical INR of 5.6.

## 2020-03-12 NOTE — Progress Notes (Signed)
Pharmacy Antibiotic Note  Jason Montes is a 69 y.o. male admitted on 03/22/2020 with MRSA bacteremia.  Pharmacy was consulted for vancomycin dosing. Since admission his renal function initially improved but today there was a bump in SCr.  Plan:  Adjust vancomycin dose to 1000 mg IV every 24 hours  Ke: 0.037, T1/2: 18.9h  Vancomycin levels as clinically indicated  Daily SCr while on vancomycin to assess renal function  Height: 5\' 9"  (175.3 cm) Weight: 81.1 kg (178 lb 12.7 oz) IBW/kg (Calculated) : 70.7  Temp (24hrs), Avg:98.3 F (36.8 C), Min:96.7 F (35.9 C), Max:99.8 F (37.7 C)  Recent Labs  Lab 03/12/2020 1755 04/01/2020 1834 03/28/2020 2000 03/10/20 0500 03/10/2020 0444 03/12/20 0338  WBC  --  21.7*  --  27.6* 22.6* 22.9*  CREATININE 1.86*  --   --  1.62* 1.68* 1.84*  LATICACIDVEN 11.0*  --  2.9*  --   --   --     Estimated Creatinine Clearance: 39 mL/min (A) (by C-G formula based on SCr of 1.84 mg/dL (H)).     Antimicrobials this admission: cefepime 10/8 >> 10/10 metronidazole 10/8 >> 10/10 vancomycin 10/8 >>    Thank you for allowing pharmacy to be a part of this patient's care.  Vallery Sa, PharmD 03/12/2020 7:16 AM

## 2020-03-12 NOTE — Progress Notes (Signed)
PODIATRY / FOOT AND ANKLE SURGERY PROGRESS NOTE  Requesting Physician: Eppie Gibson, MD  Reason for consult: Left foot infection, sepsis  Chief Complaint: Feeling unwell overall   HPI: Jason Montes is a 69 y.o. male who presents resting in bed status post 1 day left partial first ray amputation due to osteomyelitis.  Patient's mentation is relatively unchanged and unable to communicate with patient today.  Patient has dressings clean, dry, and intact since yesterday with no strikethrough on bandages.  PMHx:  Past Medical History:  Diagnosis Date  . Allergy   . Atrial fibrillation ([place])   . B12 deficiency   . Chronic kidney disease   . Chronic kidney disease (CKD), stage II (mild)   . Coronary atherosclerosis   . Diabetes mellitus without complication ([place])   . Elevated PSA   . Esophagitis   . GERD (gastroesophageal reflux disease)   . Heart attack ([place])   . Heart disease   . Hyperlipidemia   . Hypertension   . Hypokalemia   . Mild neurocognitive disorder   . Osteoarthritis   . Presbyopia   . PVD (peripheral vascular disease) ([place])   . Restless leg syndrome   . Stroke (cerebrum) ([place])   . Stroke ([place])   . Subacute osteomyelitis ([place])   . Tinea unguium   . Uncompensated short term memory deficit     Surgical Hx:  Past Surgical History:  Procedure Laterality Date  . AMPUTATION Left 03/06/2020   Procedure: LEFT PARTIAL FIRST  RAY AMPUTATION;  Surgeon: Caroline More, DPM;  Location: [facility] ORS;  Service: Podiatry;  Laterality: Left;  . AMPUTATION TOE Left 01/13/2020   Procedure: AMPUTATION RAY LEFT 1ST;  Surgeon: Caroline More, DPM;  Location: [facility] ORS;  Service: Podiatry;  Laterality: Left;  . CARDIAC PACEMAKER PLACEMENT    . COLONOSCOPY WITH PROPOFOL N/A 05/18/2018   Procedure: COLONOSCOPY WITH PROPOFOL;  Surgeon: [name], MD;  Location: [facility] ENDOSCOPY;  Service: Endoscopy;  Laterality: N/A;  . CORONARY ARTERY BYPASS GRAFT    . FOOT AMPUTATION Right   .  INTRAMEDULLARY (IM) NAIL INTERTROCHANTERIC Right 11/16/2019   Procedure: INTRAMEDULLARY (IM) NAIL INTERTROCHANTRIC;  Surgeon: Thornton Park, MD;  Location: [facility] ORS;  Service: Orthopedics;  Laterality: Right;  . LOWER EXTREMITY ANGIOGRAPHY Left 10/10/2019   Procedure: Lower Extremity Angiography;  Surgeon: Algernon Huxley, MD;  Location: [place] [facility];  Service: Cardiovascular;  Laterality: Left;  . LOWER EXTREMITY ANGIOGRAPHY Left 01/16/2020   Procedure: Lower Extremity Angiography;  Surgeon: Algernon Huxley, MD;  Location: [place] [facility];  Service: Cardiovascular;  Laterality: Left;    FHx:  Family History  Problem Relation Age of Onset  . Heart failure Mother   . Colon cancer Mother   . Diabetes Mother   . Thyroid disease Sister     Social History:  reports that he has never smoked. He has never used smokeless tobacco. He reports that he does not drink alcohol and does not use drugs.  Allergies:  Allergies  Allergen Reactions  . Amlodipine Other (See Comments)  . Penicillins     Childhood allergy, not sure what happens  Other reaction(s): Other, Other  . Norvasc [Amlodipine Besylate]     Unknown    Review of Systems: General ROS: positive for  - fatigue and fever Respiratory ROS: positive for - cough Cardiovascular ROS: no chest pain or dyspnea on exertion Gastrointestinal ROS: positive for - abdominal pain and change in stools Musculoskeletal ROS: positive for -  joint swelling Neurological ROS: positive for - numbness/tingling Dermatological ROS: positive for left foot wounds x 3  Medications Prior to Admission  Medication Sig Dispense Refill  . amiodarone (PACERONE) 400 MG tablet Take 0.5 tablets (200 mg total) by mouth daily. 30 tablet 0  . apixaban (ELIQUIS) 5 MG TABS tablet Take 1 tablet (5 mg total) by mouth 2 (two) times daily. 60 tablet 0  . ascorbic acid (VITAMIN C) 500 MG tablet Take 1 tablet (500 mg total) by mouth 2 (two) times daily. 60 tablet 0  .  aspirin EC 81 MG tablet Take 81 mg by mouth daily.    Marland Kitchen atorvastatin (LIPITOR) 40 MG tablet Take 1 tablet (40 mg total) by mouth at bedtime. 30 tablet 0  . Cholecalciferol (VITAMIN D3) 25 MCG (1000 UT) CAPS Take 1 capsule (1,000 Units total) by mouth daily. 60 capsule 0  . DULoxetine (CYMBALTA) 20 MG capsule Take 1 capsule (20 mg total) by mouth daily. 30 capsule 0  . famotidine (PEPCID) 20 MG tablet Take 1 tablet (20 mg total) by mouth daily. 30 tablet 0  . furosemide (LASIX) 20 MG tablet Take 0.5 tablets (10 mg total) by mouth daily. 30 tablet 0  . gabapentin (NEURONTIN) 100 MG capsule Take 2 capsules (200 mg total) by mouth 3 (three) times daily. 90 capsule 0  . haloperidol (HALDOL) 1 MG tablet Take 1 tablet (1 mg total) by mouth 2 (two) times daily. 14 tablet 0  . insulin aspart (NOVOLOG) 100 UNIT/ML FlexPen Inject 11 Units into the skin 3 (three) times daily with meals. 15 mL 11  . insulin glargine (LANTUS) 100 UNIT/ML Solostar Pen Inject 14 Units into the skin daily. 15 mL 11  . Multiple Vitamin (MULTIVITAMIN WITH MINERALS) TABS tablet Take 1 tablet by mouth daily.    Marland Kitchen OLANZapine zydis (ZYPREXA) 15 MG disintegrating tablet Take 1 tablet (15 mg total) by mouth at bedtime. 14 tablet 0  . pantoprazole (PROTONIX) 40 MG tablet Take 1 tablet (40 mg total) by mouth daily. 30 tablet 0  . polyethylene glycol (MIRALAX / GLYCOLAX) 17 g packet Take 17 g by mouth 2 (two) times daily. 14 each 0  . senna (SENOKOT) 8.6 MG TABS tablet Take 2 tablets by mouth in the morning and at bedtime.    . tamsulosin (FLOMAX) 0.4 MG CAPS capsule Take 2 capsules (0.8 mg total) by mouth daily. 30 capsule 0  . vitamin B-12 1000 MCG tablet Take 1 tablet (1,000 mcg total) by mouth daily. 30 tablet 0  . zinc sulfate 220 (50 Zn) MG capsule Take 1 capsule (220 mg total) by mouth daily. 30 capsule 0  . acetaminophen (TYLENOL) 325 MG tablet Take 2 tablets (650 mg total) by mouth every 6 (six) hours as needed for mild pain (or  Fever >/= 101).    . nitroGLYCERIN (NITROSTAT) 0.4 MG SL tablet Place 1 tablet (0.4 mg total) under the tongue every 5 (five) minutes as needed for chest pain. 20 tablet 12    Physical Exam: General: Alert and oriented.  No apparent distress.  Vascular: DP/PT pulses nonpalpable bilateral.  Capillary fill time does appear to be intact overall to the left midfoot and forefoot and right foot.  No hair growth noted to digits and foot bilateral.  Minimal to no edema present to the left foot and no erythema..  Neuro: Light touch sensation absent to left foot.  Derm: Left second toe ulceration appears to be healed at this time.  Left  posterior heel pressure ulceration appears to present as a stable eschar that measures approximately 0.4 x 0.3 by unstageable, minimal to no erythema or edema present to this area.      Left partial first ray amputation site appears to be stable with skin edges well coapted with sutures intact, no drainage, no odor, no erythema, mild edema, no active signs of infection present.    MSK: Right transmetatarsal amputation, left partial first ray amputation  Results for orders placed or performed during the hospital encounter of 03/19/2020 (from the past 48 hour(s))  Glucose, capillary     Status: Abnormal   Collection Time: 03/10/20  1:00 PM  Result Value Ref Range   Glucose-Capillary 158 (H) 70 - 99 mg/dL    Comment: Glucose reference range applies only to samples taken after fasting for at least 8 hours.  APTT     Status: Abnormal   Collection Time: 03/10/20  3:19 PM  Result Value Ref Range   aPTT 59 (H) 24 - 36 seconds    Comment:        IF BASELINE aPTT IS ELEVATED, SUGGEST PATIENT RISK ASSESSMENT BE USED TO DETERMINE APPROPRIATE ANTICOAGULANT THERAPY. Performed at [facility], [address] [place], [place] [address]   Troponin I (High Sensitivity)     Status: Abnormal   Collection Time: 03/10/20  3:19 PM  Result Value Ref Range    Troponin I (High Sensitivity) 219 (HH) <18 ng/L    Comment: CRITICAL VALUE NOTED. VALUE IS CONSISTENT WITH PREVIOUSLY REPORTED/CALLED VALUE SNG (NOTE) Elevated high sensitivity troponin I (hsTnI) values and significant  changes across serial measurements may suggest ACS but many other  chronic and acute conditions are known to elevate hsTnI results.  Refer to the "Links" section for chest pain algorithms and additional  guidance. Performed at [facility], [address] [place], [place] [address]   Glucose, capillary     Status: Abnormal   Collection Time: 03/10/20  4:19 PM  Result Value Ref Range   Glucose-Capillary 144 (H) 70 - 99 mg/dL    Comment: Glucose reference range applies only to samples taken after fasting for at least 8 hours.  MRSA PCR Screening     Status: Abnormal   Collection Time: 03/10/20  4:21 PM   Specimen: Nasopharyngeal  Result Value Ref Range   MRSA by PCR POSITIVE (A) NEGATIVE    Comment:        The GeneXpert MRSA Assay (FDA approved for NASAL specimens only), is one component of a comprehensive MRSA colonization surveillance program. It is not intended to diagnose MRSA infection nor to guide or monitor treatment for MRSA infections. RESULT CALLED TO, READ BACK BY AND VERIFIED WITH:  [facility] HARRIS AT 0981 03/10/20 SDR Performed at [facility], [address] [place], [place] [address]   Glucose, capillary     Status: Abnormal   Collection Time: 03/10/20  8:32 PM  Result Value Ref Range   Glucose-Capillary 141 (H) 70 - 99 mg/dL    Comment: Glucose reference range applies only to samples taken after fasting for at least 8 hours.  APTT     Status: Abnormal   Collection Time: 03/10/20  9:32 PM  Result Value Ref Range   aPTT 95 (H) 24 - 36 seconds    Comment:        IF BASELINE aPTT IS ELEVATED, SUGGEST PATIENT RISK ASSESSMENT BE USED TO DETERMINE APPROPRIATE ANTICOAGULANT THERAPY. Performed at [facility], [id number] [place], [place] [address]   Glucose, capillary     Status: Abnormal   Collection Time: 03/10/20 11:56 PM  Result Value Ref Range   Glucose-Capillary 154 (H) 70 - 99 mg/dL    Comment: Glucose reference range applies only to samples taken after fasting for at least 8 hours.  Procalcitonin     Status: None   Collection Time: 03/22/2020  4:44 AM  Result Value Ref Range   Procalcitonin 28.61 ng/mL    Comment:        Interpretation: PCT >= 10 ng/mL: Important systemic inflammatory response, almost exclusively due to severe bacterial sepsis or septic shock. (NOTE)       Sepsis PCT Algorithm           Lower Respiratory Tract                                      Infection PCT Algorithm    ----------------------------     ----------------------------         PCT < 0.25 ng/mL                PCT < 0.10 ng/mL          Strongly encourage             Strongly discourage   discontinuation of antibiotics    initiation of antibiotics    ----------------------------     -----------------------------       PCT 0.25 - 0.50 ng/mL            PCT 0.10 - 0.25 ng/mL               OR       >80% decrease in PCT            Discourage initiation of                                            antibiotics      Encourage discontinuation           of antibiotics    ----------------------------     -----------------------------         PCT >= 0.50 ng/mL              PCT 0.26 - 0.50 ng/mL                AND       <80% decrease in PCT             Encourage initiation of                                             antibiotics       Encourage continuation           of antibiotics    ----------------------------     -----------------------------        PCT >= 0.50 ng/mL                  PCT > 0.50 ng/mL               AND         increase in  PCT                  Strongly encourage                                      initiation of antibiotics    Strongly encourage escalation           of antibiotics                                      -----------------------------                                           PCT <= 0.25 ng/mL                                                 OR                                        > 80% decrease in PCT                                      Discontinue / Do not initiate                                             antibiotics  Performed at [facility], [address] [place], [place] [address]   Heparin level (unfractionated)     Status: Abnormal   Collection Time: 03/30/2020  4:44 AM  Result Value Ref Range   Heparin Unfractionated 2.54 (H) 0.30 - 0.70 IU/mL    Comment: RESULTS CONFIRMED BY MANUAL DILUTION (NOTE) If heparin results are below expected values, and patient dosage has  been confirmed, suggest follow up testing of antithrombin III levels. Performed at [facility], [address] [place], [place] [address]   Hepatitis panel, acute     Status: None   Collection Time: 03/14/2020  4:44 AM  Result Value Ref Range   Hepatitis B Surface Ag NON REACTIVE NON REACTIVE   HCV Ab NON REACTIVE NON REACTIVE    Comment: (NOTE) Nonreactive HCV antibody screen is consistent with no HCV infections,  unless recent infection is suspected or other evidence exists to indicate HCV infection.     Hep A IgM NON REACTIVE NON REACTIVE   Hep B C IgM NON REACTIVE NON REACTIVE    Comment: Performed at [facility], [place], [place],  [address]  CBC     Status: Abnormal   Collection Time: 03/07/2020  4:44 AM  Result Value Ref Range   WBC 22.6 (H) 4.0 - 10.5 K/uL   RBC 3.20 (L) 4.22 - 5.81 MIL/uL   Hemoglobin 8.2 (L) 13.0 - 17.0 g/dL   HCT 27.0 (L) 39 - 52 %  MCV 84.4 80.0 - 100.0 fL   MCH 25.6 (L) 26.0 - 34.0 pg   MCHC 30.4 30.0 - 36.0 g/dL   RDW 17.9 (H) 11.5 - 15.5 %   Platelets 129 (L) 150 - 400 K/uL    Comment: PLATELET COUNT PERFORMED ON CITRATED BLOOD Immature Platelet Fraction may be clinically indicated,  consider ordering this additional test [id number]    nRBC 0.0 0.0 - 0.2 %    Comment: Performed at [facility], [address] [place], [place] [address]  Comprehensive metabolic panel     Status: Abnormal   Collection Time: 03/08/2020  4:44 AM  Result Value Ref Range   Sodium 135 135 - 145 mmol/L   Potassium 3.8 3.5 - 5.1 mmol/L   Chloride 105 98 - 111 mmol/L   CO2 20 (L) 22 - 32 mmol/L   Glucose, Bld 174 (H) 70 - 99 mg/dL    Comment: Glucose reference range applies only to samples taken after fasting for at least 8 hours.   BUN 36 (H) 8 - 23 mg/dL   Creatinine, Ser 1.68 (H) 0.61 - 1.24 mg/dL   Calcium 7.6 (L) 8.9 - 10.3 mg/dL   Total Protein 8.2 (H) 6.5 - 8.1 g/dL   Albumin 2.6 (L) 3.5 - 5.0 g/dL   AST 2,306 (H) 15 - 41 U/L    Comment: RESULT CONFIRMED BY MANUAL DILUTION.PMF   ALT 1,641 (H) 0 - 44 U/L   Alkaline Phosphatase 92 38 - 126 U/L   Total Bilirubin 1.7 (H) 0.3 - 1.2 mg/dL   GFR, Estimated 41 (L) >60 mL/min   Anion gap 10 5 - 15    Comment: Performed at [facility], [address] [place], [place] [address]  APTT     Status: Abnormal   Collection Time: 03/13/2020  4:44 AM  Result Value Ref Range   aPTT 124 (H) 24 - 36 seconds    Comment:        IF BASELINE aPTT IS ELEVATED, SUGGEST PATIENT RISK ASSESSMENT BE USED TO DETERMINE APPROPRIATE ANTICOAGULANT THERAPY. Performed at [facility], [address] [place], [place] [address]   Glucose, capillary     Status: Abnormal   Collection Time: 03/14/2020  4:53 AM  Result Value Ref Range   Glucose-Capillary 162 (H) 70 - 99 mg/dL    Comment: Glucose reference range applies only to samples taken after fasting for at least 8 hours.  Glucose, capillary     Status: Abnormal   Collection Time: 03/05/2020  7:35 AM  Result Value Ref Range   Glucose-Capillary 146 (H) 70 - 99 mg/dL    Comment: Glucose reference range applies only to samples taken after fasting for at least 8 hours.  Glucose,  capillary     Status: Abnormal   Collection Time: 03/12/2020 11:56 AM  Result Value Ref Range   Glucose-Capillary 145 (H) 70 - 99 mg/dL    Comment: Glucose reference range applies only to samples taken after fasting for at least 8 hours.  APTT     Status: Abnormal   Collection Time: 03/03/2020  1:51 PM  Result Value Ref Range   aPTT 44 (H) 24 - 36 seconds    Comment:        IF BASELINE aPTT IS ELEVATED, SUGGEST PATIENT RISK ASSESSMENT BE USED TO DETERMINE APPROPRIATE ANTICOAGULANT THERAPY. Performed at [facility], [address], [place] [address]   CULTURE, BLOOD (ROUTINE X 2) w Reflex to ID  Panel     Status: None (Preliminary result)   Collection Time: 03/04/2020  2:50 PM   Specimen: BLOOD  Result Value Ref Range   Specimen Description BLOOD BLOOD RIGHT HAND    Special Requests      BOTTLES DRAWN AEROBIC AND ANAEROBIC Blood Culture adequate volume   Culture      NO GROWTH < 24 HOURS Performed at [facility], [address], [place], [place] [address]    Report Status PENDING   CULTURE, BLOOD (ROUTINE X 2) w Reflex to ID Panel     Status: None (Preliminary result)   Collection Time: 03/27/2020  2:59 PM   Specimen: BLOOD  Result Value Ref Range   Specimen Description BLOOD BLOOD RIGHT HAND    Special Requests      BOTTLES DRAWN AEROBIC AND ANAEROBIC Blood Culture adequate volume   Culture      NO GROWTH < 24 HOURS Performed at [facility], [address] [place], [place] [address]    Report Status PENDING   Glucose, capillary     Status: Abnormal   Collection Time: 03/10/2020  3:46 PM  Result Value Ref Range   Glucose-Capillary 172 (H) 70 - 99 mg/dL    Comment: Glucose reference range applies only to samples taken after fasting for at least 8 hours.  Glucose, capillary     Status: Abnormal   Collection Time: 03/27/2020  7:33 PM  Result Value Ref Range   Glucose-Capillary 174 (H) 70 - 99 mg/dL    Comment: Glucose reference range applies  only to samples taken after fasting for at least 8 hours.  Glucose, capillary     Status: Abnormal   Collection Time: 03/27/2020  8:17 PM  Result Value Ref Range   Glucose-Capillary 162 (H) 70 - 99 mg/dL    Comment: Glucose reference range applies only to samples taken after fasting for at least 8 hours.  Glucose, capillary     Status: Abnormal   Collection Time: 03/12/20 12:24 AM  Result Value Ref Range   Glucose-Capillary 131 (H) 70 - 99 mg/dL    Comment: Glucose reference range applies only to samples taken after fasting for at least 8 hours.  Procalcitonin     Status: None   Collection Time: 03/12/20  3:38 AM  Result Value Ref Range   Procalcitonin 21.04 ng/mL    Comment:        Interpretation: PCT >= 10 ng/mL: Important systemic inflammatory response, almost exclusively due to severe bacterial sepsis or septic shock. (NOTE)       Sepsis PCT Algorithm           Lower Respiratory Tract                                      Infection PCT Algorithm    ----------------------------     ----------------------------         PCT < 0.25 ng/mL                PCT < 0.10 ng/mL          Strongly encourage             Strongly discourage   discontinuation of antibiotics    initiation of antibiotics    ----------------------------     -----------------------------       PCT 0.25 - 0.50 ng/mL  PCT 0.10 - 0.25 ng/mL               OR       >80% decrease in PCT            Discourage initiation of                                            antibiotics      Encourage discontinuation           of antibiotics    ----------------------------     -----------------------------         PCT >= 0.50 ng/mL              PCT 0.26 - 0.50 ng/mL                AND       <80% decrease in PCT             Encourage initiation of                                             antibiotics       Encourage continuation           of antibiotics    ----------------------------      -----------------------------        PCT >= 0.50 ng/mL                  PCT > 0.50 ng/mL               AND         increase in PCT                  Strongly encourage                                      initiation of antibiotics    Strongly encourage escalation           of antibiotics                                     -----------------------------                                           PCT <= 0.25 ng/mL                                                 OR                                        > 80% decrease in PCT  Discontinue / Do not initiate                                             antibiotics  Performed at [facility], [address] [place], [place] [address]   CBC     Status: Abnormal   Collection Time: 03/12/20  3:38 AM  Result Value Ref Range   WBC 22.9 (H) 4.0 - 10.5 K/uL   RBC 3.63 (L) 4.22 - 5.81 MIL/uL   Hemoglobin 9.0 (L) 13.0 - 17.0 g/dL   HCT 31.3 (L) 39 - 52 %   MCV 86.2 80.0 - 100.0 fL   MCH 24.8 (L) 26.0 - 34.0 pg   MCHC 28.8 (L) 30.0 - 36.0 g/dL   RDW 18.0 (H) 11.5 - 15.5 %   Platelets 75 (L) 150 - 400 K/uL    Comment: PLATELET COUNT PERFORMED ON CITRATED BLOOD   nRBC 0.2 0.0 - 0.2 %    Comment: Performed at [facility], [address] [place], [place] [address]  Comprehensive metabolic panel     Status: Abnormal   Collection Time: 03/12/20  3:38 AM  Result Value Ref Range   Sodium 139 135 - 145 mmol/L   Potassium 3.9 3.5 - 5.1 mmol/L   Chloride 109 98 - 111 mmol/L   CO2 17 (L) 22 - 32 mmol/L   Glucose, Bld 138 (H) 70 - 99 mg/dL    Comment: Glucose reference range applies only to samples taken after fasting for at least 8 hours.   BUN 49 (H) 8 - 23 mg/dL   Creatinine, Ser 1.84 (H) 0.61 - 1.24 mg/dL   Calcium 7.8 (L) 8.9 - 10.3 mg/dL   Total Protein 8.1 6.5 - 8.1 g/dL   Albumin 2.5 (L) 3.5 - 5.0 g/dL   AST 4,704 (H) 15 - 41 U/L    Comment: RESULT CONFIRMED BY MANUAL DILUTION / JAG    ALT 3,337 (H) 0 - 44 U/L    Comment: RESULT CONFIRMED BY MANUAL DILUTION / JAG   Alkaline Phosphatase 104 38 - 126 U/L   Total Bilirubin 2.0 (H) 0.3 - 1.2 mg/dL   GFR, Estimated 37 (L) >60 mL/min   Anion gap 13 5 - 15    Comment: Performed at [facility], [address] [place], [place] [address]  Glucose, capillary     Status: Abnormal   Collection Time: 03/12/20  4:11 AM  Result Value Ref Range   Glucose-Capillary 133 (H) 70 - 99 mg/dL    Comment: Glucose reference range applies only to samples taken after fasting for at least 8 hours.  Glucose, capillary     Status: Abnormal   Collection Time: 03/12/20  7:30 AM  Result Value Ref Range   Glucose-Capillary 103 (H) 70 - 99 mg/dL    Comment: Glucose reference range applies only to samples taken after fasting for at least 8 hours.  APTT     Status: Abnormal   Collection Time: 03/12/20  7:58 AM  Result Value Ref Range   aPTT 40 (H) 24 - 36 seconds    Comment:        IF BASELINE aPTT IS ELEVATED, SUGGEST PATIENT RISK ASSESSMENT BE USED TO DETERMINE APPROPRIATE ANTICOAGULANT THERAPY. Performed at [facility], [address], [place], [place] [address]   Protime-INR     Status: Abnormal  Collection Time: 03/12/20  7:58 AM  Result Value Ref Range   Prothrombin Time 48.9 (H) 11.4 - 15.2 seconds   INR 5.6 (HH) 0.8 - 1.2    Comment: RESULT REPEATED AND VERIFIED CRITICAL RESULT CALLED TO, READ BACK BY AND VERIFIED WITH: MEGAN FURR AT 1610 ON 03/12/2020 [place]. (NOTE) INR goal varies based on device and disease states. Performed at [facility], [address] [place], [place] [address]   Fibrinogen     Status: None   Collection Time: 03/12/20  7:58 AM  Result Value Ref Range   Fibrinogen 341 210 - 475 mg/dL    Comment: Performed at [facility], [address] [place], [place] [address]  Lactate dehydrogenase     Status: Abnormal   Collection Time: 03/12/20  7:58 AM  Result Value Ref  Range   LDH 2,278 (H) 98 - 192 U/L    Comment: Performed at [facility], [address] [place], [place] [address]  Glucose, capillary     Status: None   Collection Time: 03/12/20 11:43 AM  Result Value Ref Range   Glucose-Capillary 96 70 - 99 mg/dL    Comment: Glucose reference range applies only to samples taken after fasting for at least 8 hours.   DG Chest Port 1 View  Result Date: 03/12/2020 CLINICAL DATA:  Tachypnea. EXAM: PORTABLE CHEST 1 VIEW COMPARISON:  March 09, 2020. FINDINGS: Stable cardiomegaly. Status post coronary bypass graft. Left-sided pacemaker is unchanged in position. No pneumothorax or pleural effusion is noted. Stable minimal to mild bibasilar opacities are noted concerning for scarring or atelectasis. Bony thorax is unremarkable. IMPRESSION: No active disease. Electronically Signed   By: Marijo Conception M.D.   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 Remaining osseous structures appear intact and unchanged in appearance. No additional sites of bony erosion or cortical destruction. Expected postoperative changes within the soft tissues at the resection site. Advanced small vessel vascular calcifications. 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 Electronically Signed   By: Davina Poke D.O.   On: 03/12/2020 08:29   DG Foot Complete Left  Result Date: 03/10/2020 CLINICAL DATA:  [place] EXAM: LEFT FOOT - COMPLETE 3+ VIEW COMPARISON:  January 13, 2020 FINDINGS: Status post amputation of LEFT great toe. Osteopenia. There is a new erosion of the medial aspect of the first metatarsal. There is increased lucency along medial aspect of the first metatarsal. The asymmetric sclerosis of the first metatarsal  head, similar comparison to prior. Increased irregularity and cortical indistinctness of the lateral hallux sesamoid. Vascular calcifications. Scattered midfoot degenerative changes. Enthesophyte of the Achilles tendon. IMPRESSION: 1. New erosion of the medial aspect of the first metatarsal with increased lucency along the medial aspect of the first metatarsal, concerning for osteomyelitis. 2. Increased irregularity and cortical indistinctness of the lateral hallux sesamoid, suspicious for osteomyelitis. Electronically Signed   By: Valentino Saxon MD   On: 03/10/2020 14:20   ECHOCARDIOGRAM COMPLETE  Result Date: 03/10/2020    ECHOCARDIOGRAM REPORT   Patient Name:   Jason Montes Date of Exam: 03/10/2020 Medical Rec #:  [id number]           Height:       69.0 in Accession #:  [id number]          Weight:       175.0 lb Date of Birth:  Jan 29, 1951           BSA:          1.952 m Patient Age:    66 years            BP:           81/60 mmHg Patient Gender: M                   HR:           85 bpm. Exam Location:  [facility] Procedure: 2D Echo, Cardiac Doppler and Color Doppler Indications:     Abnormal ECG 794.31  History:         Patient has prior history of Echocardiogram examinations, most                  recent 11/15/2019. Stroke; Risk Factors:Hypertension. PVD.  Sonographer:     Sherrie Sport RDCS (AE) Referring Phys:  [id number] Wyvonnia Dusky Diagnosing Phys: Ida Rogue MD  Sonographer Comments: Image acquisition challenging due to uncooperative patient. IMPRESSIONS  1. Limited study secondary to patient terminating study prematurely.  2. Left ventricular ejection fraction could not be accurately estimated, grossly 40 to 45%. The left ventricle has mildly decreased function. Unable to exclude anteroseptal hypokinesis.  3. Right ventricle is not well visualized. Right ventricular systolic function is grossly mildly reduced. The right ventricular size is normal.  4. Left atrial size was mildly dilated.  5.  The mitral valve is normal in structure. Mild mitral valve regurgitation.  6. The aortic valve was not well visualized. Aortic valve regurgitation is not visualized. FINDINGS  Left Ventricle: Left ventricular ejection fraction, by estimation, is 40 to 45%. The left ventricle has mildly decreased function. The left ventricular internal cavity size was normal in size. There is no left ventricular hypertrophy. Left ventricular diastolic parameters are indeterminate. Right Ventricle: The right ventricular size is normal. No increase in right ventricular wall thickness. Right ventricular systolic function is mildly reduced. Left Atrium: Left atrial size was mildly dilated. Right Atrium: Right atrial size was normal in size. Pericardium: The pericardium was not well visualized. Mitral Valve: The mitral valve is normal in structure. Mild mitral valve regurgitation. Tricuspid Valve: The tricuspid valve is not well visualized. Tricuspid valve regurgitation is not demonstrated. Aortic Valve: The aortic valve was not well visualized. Aortic valve regurgitation is not visualized. Pulmonic Valve: The pulmonic valve was not well visualized. Pulmonic valve regurgitation is not visualized. Aorta: The aortic root was not well visualized and the ascending aorta was not well visualized. IAS/Shunts: The interatrial septum was not assessed. Additional Comments: A pacer wire is visualized.  LEFT VENTRICLE PLAX 2D LVIDd:         4.94 cm LVIDs:         4.65 cm LV PW:         1.06 cm LV IVS:        1.03 cm LVOT diam:     2.00 cm LVOT Area:     3.14 cm  LEFT ATRIUM         Index LA diam:    4.60 cm 2.36 cm/m   AORTA Ao Root diam: 3.40 cm  SHUNTS Systemic Diam: 2.00 cm Ida Rogue MD Electronically signed by Ida Rogue MD Signature Date/Time: 03/10/2020/4:03:17 PM    Final    US  Abdomen Limited RUQ  Result Date: 03/20/2020 CLINICAL DATA:  Elevated liver enzymes. EXAM: ULTRASOUND ABDOMEN LIMITED RIGHT UPPER QUADRANT COMPARISON:   None. FINDINGS: Gallbladder: No gallstones or wall thickening visualized. No sonographic Murphy sign noted by sonographer. Common bile duct: Diameter: 3 mm, within normal limits. Liver: No focal lesion identified. Within normal limits in parenchymal echogenicity. Portal vein is patent on color Doppler imaging with normal direction of blood flow towards the liver. Other: Right pleural effusion incidentally noted. IMPRESSION: No hepatobiliary abnormality identified. Right pleural effusion incidentally noted. Electronically Signed   By: Marlaine Hind M.D.   On: 03/16/2020 19:31    Blood pressure (!) 110/50, pulse (!) 29, temperature 99.4 F (37.4 C), temperature source Oral, resp. rate (!) 27, height 5\' 9"  (1.753 m), weight 81.1 kg, SpO2 94 %.   Assessment 1. Bacteremia/sepsis of unknown origin 2. 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 Pressure ulceration of the posterior aspect left heel, stable 4. Uncontrolled diabetes type 2 polyneuropathy 5. PVD  Plan -Patient seen and examined. -Postoperative x-rays examined.  Appears to have appropriate beveling and cut to the first metatarsal with no evidence of infection.  Severe arterial calcification noted. -Remove bandages today.  Skin edges appear to be well coapted with sutures intact left partial first ray amputation site.  Posterior left heel eschar appears to be intact with no drainage, no erythema, no edema.  No acute signs of infection noted to left lower extremity. -Wound culture from surgery growing staph, sensitivities pending.  Patient's most recent blood cultures have no growth to date.  Appreciate antibiotic recommendations per infectious disease.  Currently believe that all infection was removed with amputation.  Pathology pending.  Posterior left heel ulceration appears to be stable with no changes on x-ray. -Appreciate vascular recommendations.  Unsure if any  further revascularization attempts could be made as patient has very significant peripheral vascular disease.  Podiatry team will continue to monitor until discharge.  We will follow more peripherally at this time.  Caroline More, D.P.M. 03/12/2020, 12:59 PM

## 2020-03-12 NOTE — Progress Notes (Signed)
Pt resting in bed quietly. Pt is still confused and yelling out. Safety mitts on pt d/t him attempting to pull at wires and IVs. Pt attempts to bite off safety mitts. Ativan given twice throughout shift. VSS on room air.

## 2020-03-13 ENCOUNTER — Telehealth: Payer: Self-pay

## 2020-03-13 DIAGNOSIS — S91302A Unspecified open wound, left foot, initial encounter: Secondary | ICD-10-CM | POA: Diagnosis not present

## 2020-03-13 DIAGNOSIS — A4102 Sepsis due to Methicillin resistant Staphylococcus aureus: Secondary | ICD-10-CM | POA: Diagnosis not present

## 2020-03-13 DIAGNOSIS — R7881 Bacteremia: Secondary | ICD-10-CM | POA: Diagnosis not present

## 2020-03-13 DIAGNOSIS — A419 Sepsis, unspecified organism: Secondary | ICD-10-CM | POA: Diagnosis not present

## 2020-03-13 DIAGNOSIS — R6521 Severe sepsis with septic shock: Secondary | ICD-10-CM | POA: Diagnosis not present

## 2020-03-13 DIAGNOSIS — D65 Disseminated intravascular coagulation [defibrination syndrome]: Secondary | ICD-10-CM

## 2020-03-13 DIAGNOSIS — B9561 Methicillin susceptible Staphylococcus aureus infection as the cause of diseases classified elsewhere: Secondary | ICD-10-CM | POA: Diagnosis not present

## 2020-03-13 DIAGNOSIS — R4182 Altered mental status, unspecified: Secondary | ICD-10-CM | POA: Diagnosis not present

## 2020-03-13 DIAGNOSIS — K7200 Acute and subacute hepatic failure without coma: Secondary | ICD-10-CM

## 2020-03-13 DIAGNOSIS — L97529 Non-pressure chronic ulcer of other part of left foot with unspecified severity: Secondary | ICD-10-CM | POA: Diagnosis not present

## 2020-03-13 DIAGNOSIS — M8660 Other chronic osteomyelitis, unspecified site: Secondary | ICD-10-CM | POA: Diagnosis not present

## 2020-03-13 DIAGNOSIS — R0682 Tachypnea, not elsewhere classified: Secondary | ICD-10-CM | POA: Diagnosis not present

## 2020-03-13 LAB — COMPREHENSIVE METABOLIC PANEL
ALT: 2825 U/L — ABNORMAL HIGH (ref 0–44)
AST: 2756 U/L — ABNORMAL HIGH (ref 15–41)
Albumin: 2.5 g/dL — ABNORMAL LOW (ref 3.5–5.0)
Alkaline Phosphatase: 113 U/L (ref 38–126)
Anion gap: 15 (ref 5–15)
BUN: 59 mg/dL — ABNORMAL HIGH (ref 8–23)
CO2: 13 mmol/L — ABNORMAL LOW (ref 22–32)
Calcium: 7.9 mg/dL — ABNORMAL LOW (ref 8.9–10.3)
Chloride: 111 mmol/L (ref 98–111)
Creatinine, Ser: 1.87 mg/dL — ABNORMAL HIGH (ref 0.61–1.24)
GFR, Estimated: 36 mL/min — ABNORMAL LOW (ref >60)
Glucose, Bld: 218 mg/dL — ABNORMAL HIGH (ref 70–99)
Potassium: 4.2 mmol/L (ref 3.5–5.1)
Sodium: 139 mmol/L (ref 135–145)
Total Bilirubin: 2.5 mg/dL — ABNORMAL HIGH (ref 0.3–1.2)
Total Protein: 7.8 g/dL (ref 6.5–8.1)

## 2020-03-13 LAB — CBC
HCT: 28.4 % — ABNORMAL LOW (ref 39.0–52.0)
Hemoglobin: 8.3 g/dL — ABNORMAL LOW (ref 13.0–17.0)
MCH: 25.5 pg — ABNORMAL LOW (ref 26.0–34.0)
MCHC: 29.2 g/dL — ABNORMAL LOW (ref 30.0–36.0)
MCV: 87.1 fL (ref 80.0–100.0)
Platelets: UNDETERMINED 10*3/uL (ref 150–400)
RBC: 3.26 MIL/uL — ABNORMAL LOW (ref 4.22–5.81)
RDW: 18.6 % — ABNORMAL HIGH (ref 11.5–15.5)
WBC: 14.6 10*3/uL — ABNORMAL HIGH (ref 4.0–10.5)
nRBC: 0.2 % (ref 0.0–0.2)

## 2020-03-13 LAB — GLUCOSE, CAPILLARY
Glucose-Capillary: 220 mg/dL — ABNORMAL HIGH (ref 70–99)
Glucose-Capillary: 220 mg/dL — ABNORMAL HIGH (ref 70–99)
Glucose-Capillary: 223 mg/dL — ABNORMAL HIGH (ref 70–99)
Glucose-Capillary: 223 mg/dL — ABNORMAL HIGH (ref 70–99)
Glucose-Capillary: 228 mg/dL — ABNORMAL HIGH (ref 70–99)
Glucose-Capillary: 233 mg/dL — ABNORMAL HIGH (ref 70–99)

## 2020-03-13 LAB — PROTIME-INR
INR: 6.4 (ref 0.8–1.2)
Prothrombin Time: 54.5 seconds — ABNORMAL HIGH (ref 11.4–15.2)

## 2020-03-13 LAB — IRON AND TIBC
Iron: 55 ug/dL (ref 45–182)
Saturation Ratios: 18 % (ref 17.9–39.5)
TIBC: 312 ug/dL (ref 250–450)
UIBC: 257 ug/dL

## 2020-03-13 LAB — RETICULOCYTES
Immature Retic Fract: 34 % — ABNORMAL HIGH (ref 2.3–15.9)
RBC.: 3.26 MIL/uL — ABNORMAL LOW (ref 4.22–5.81)
Retic Count, Absolute: 74.7 10*3/uL (ref 19.0–186.0)
Retic Ct Pct: 2.3 % (ref 0.4–3.1)

## 2020-03-13 LAB — SURGICAL PATHOLOGY

## 2020-03-13 LAB — PATHOLOGIST SMEAR REVIEW

## 2020-03-13 LAB — VITAMIN B12: Vitamin B-12: >7500 pg/mL — ABNORMAL HIGH (ref 180–914)

## 2020-03-13 LAB — FERRITIN: Ferritin: 143 ng/mL (ref 24–336)

## 2020-03-13 LAB — FOLATE: Folate: 35 ng/mL (ref >5.9)

## 2020-03-13 LAB — AMMONIA: Ammonia: 34 umol/L (ref 9–35)

## 2020-03-13 MED ORDER — VANCOMYCIN HCL 750 MG/150ML IV SOLN
750.0000 mg | Freq: Two times a day (BID) | INTRAVENOUS | Status: DC
  Administered 2020-03-13 – 2020-03-14 (×2): 750 mg via INTRAVENOUS
  Filled 2020-03-13 (×3): qty 150

## 2020-03-13 MED ORDER — VITAMIN K1 10 MG/ML IJ SOLN
10.0000 mg | Freq: Once | INTRAMUSCULAR | Status: AC
  Administered 2020-03-13: 10 mg via SUBCUTANEOUS
  Filled 2020-03-13: qty 1

## 2020-03-13 NOTE — Progress Notes (Signed)
[facility] Daily Progress Note   Subjective: Patient lethargic this AM.   Objective: Vitals:   03/12/20 2328 03/13/20 0420 03/13/20 0725 03/13/20 0810  BP: 100/70 118/63 (!) 133/58 116/60  Pulse: 88 86 86 88  Resp: 18 19 17 16   Temp: 97.6 F (36.4 C) 97.7 F (36.5 C) 97.8 F (36.6 C) 97.7 F (36.5 C)  TempSrc: Oral Oral Oral Oral  SpO2: 100% 100% 99% 99%  Weight:  86.4 kg    Height:        Intake/Output Summary (Last 24 hours) at 03/13/2020 1003 Last data filed at 03/13/2020 0500 Gross per 24 hour  Intake 1632.49 ml  Output 650 ml  Net 982.49 ml   Physical Exam: NAD CV: RRR Pulmonary: CTA Bilaterally Abdomen: Soft, Nontender, Nondistended Vascular: Left Lower Extremity: Podiatry dressing intact, clean and dry   Laboratory: CBC    Component Value Date/Time   WBC 14.6 (H) 03/13/2020 0459   HGB 8.3 (L) 03/13/2020 0459   HCT 28.4 (L) 03/13/2020 0459   PLT PLATELET CLUMPS NOTED ON SMEAR, UNABLE TO ESTIMATE 03/13/2020 0459   BMET    Component Value Date/Time   NA 139 03/13/2020 0459   K 4.2 03/13/2020 0459   CL 111 03/13/2020 0459   CO2 13 (L) 03/13/2020 0459   GLUCOSE 218 (H) 03/13/2020 0459   BUN 59 (H) 03/13/2020 0459   CREATININE 1.87 (H) 03/13/2020 0459   CALCIUM 7.9 (L) 03/13/2020 0459   GFRNONAA 36 (L) 03/13/2020 0459   GFRAA >60 02/17/2020 0634   Assessment/Planning: The patient is a 69 year old male multiple medical issues including known peripheral artery disease s/p multiple endovascular interventions  1) no bleeding noted during recent procedure 2) ?  Micro vessel disease in the foot.  Recent ABI on March 06, 2020 with triphasic blood flow distally bilaterally 3) Will place the patient on the schedule for Thursday for her left lower extremity angiogram possible intervention.  Patient's INR need to be less than 3  Discussed with Dr. Ellis Parents [facility] PA-C 03/13/2020 10:03 AM

## 2020-03-13 NOTE — Progress Notes (Signed)
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 [facility])   Essential hypertension   History of stroke with current residual effects   Acute renal failure superimposed on stage 3a chronic kidney disease ([place])   Subacute osteomyelitis of left foot (HCC)   Aspiration pneumonia ([place])   Chronic systolic congestive heart failure (HCC)   Hyperammonemia (HCC)   Diabetic foot ulcer associated with type 2 diabetes mellitus (HCC)   Severe sepsis with acute organ dysfunction (HCC)   Elevated troponin   Chronic anemia   Severe sepsis (HCC)   Abnormal LFTs   Atherosclerosis of native arteries of the extremities with ulceration (HCC)   Septic shock:  with acute organ dysfunction. Presented w/ fever, tachycardia, elevated WBC, elevated lactic acid & end organ dysfunction (AKI, transaminitis, & encephalopathy). Etiology unclear, possibly multiple infections. Continue on IV vanco  Bacteremia: blood cxs growing MRSA. Continue on IV vanco. Repeat blood cxs NGTD. TEE on hold as pt not able to sign consent and understand the procedure as per cardio.  PVD: w/ hx of amputation at right midfoot, left MTP & chronic osteomyelitis of left foot. Seen by his podiatrist on 10/8 for necrotic wound left foot with chronic osteomyelitis.  Unlikely source of infection. 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 Will go for LLE angiogram w/ possible intervention 03/26/2020 as per vascular surg. Podiatry & vascular surgery recs apprec  Transaminitis: severe & labile, etiology unclear, possibly from shock liver. [place] abd shows no hepatobiliary abnormality identified. Hepatitis panel neg. GI  consulted  Thrombocytopenia: etiology unclear. Will continue to monitor. Petechiae of LLE. PT, PTT, LDH are all elevated. Haptoglobin pending. Fibrinogen is WNL. Possible DIC. S/p IV vitamin K x1. Continue to monitor INR/PT daily & fibrinogen every other day as per heme. No role of therapeutic anticoagulation currently in the absence of thrombosis, both bleeding thrombosis can be seen in DIC as per heme. See onco/heme notes for more recs   Aspiration pneumonia: continue on IV abxs. Continue on dysphagia diet as per speech   Possible colitis: as per CT scan. Etiology unclear. Continue on IV abxs.   Acute metabolic encephalopathy: likely secondary to infection & psychotropics. Continue to hold gabapentin, haldol, zyprexa   AKI on CKDIIIa: baseline Cr is 1.3. Cr is trending up daily. Avoid nephrotoxic meds  Hyperammoniemia: etiology unclear, likely secondary to shocked liver . Will continue to monitor  A. fib: w/ RVR. Likely PAF. Continue to hold eliquis for possible DIC. Hold home dose of amiodarone secondary to severe transaminitis/shocked liver.  Continue on tele   Elevated troponin: likely secondary to demand ischemia. Troponin I trending down yesterday. Echo shows EF 40-45%, mildly decreased LV which very similar to pt's echo in June 2021. Cardio following   DM2: poorly controlled. Continue on SSI w/ accuchecks    HTN: not on any anti-HTN meds as per med rec   Chronic systolic CHF: continue to hold home dose of lasix. Monitor I/Os. Not on BB, ARB/ACE-I    Chronic anemia: no need for a transfusion at this time. Will continue to monitor     DVT prophylaxis: SCDs Code Status: DNR Family Communication:  Disposition Plan: likely d/c to SNF  Status is: Inpatient  Remains inpatient appropriate because:Ongoing diagnostic testing needed not appropriate for outpatient work up, IV treatments appropriate due to intensity of illness or inability to take PO and Inpatient level of care  appropriate due to severity of illness   Dispo: The patient is from: home               Anticipated d/c is to: SNF              Anticipated d/c date is: > 3 days              Patient currently is not medically stable to d/c.   Consultants:  ID: Dr. Delaine Lame Podiatry: Dr. Luana Shu  Vascular surg: Dr. Trula Slade Hematology: Dr. Janese Banks  GI: Bonna Gains  Cardio: Dr. Neita Carp   Procedures:    Antimicrobials: vanco    Subjective: Pt is still very lethargic this morning.   Objective: Vitals:   03/12/20 2035 03/12/20 2328 03/13/20 0420 03/13/20 0725  BP: 130/75 100/70 118/63 (!) 133/58  Pulse: 87 88 86 86  Resp: (!) 24 18 19 17   Temp: 97.9 F (36.6 C) 97.6 F (36.4 C) 97.7 F (36.5 C) 97.8 F (36.6 C)  TempSrc: Oral Oral Oral Oral  SpO2: 99% 100% 100% 99%  Weight:   86.4 kg   Height:        Intake/Output Summary (Last 24 hours) at 03/13/2020 0740 Last data filed at 03/13/2020 0500 Gross per 24 hour  Intake 2219.24 ml  Output 650 ml  Net 1569.24 ml   Filed Weights   03/19/2020 1707 03/10/20 1617 03/13/20 0420  Weight: 79.4 kg 81.1 kg 86.4 kg    Examination:  General exam: Appears calm & lethargic  Respiratory system: diminished breath sounds b/l. No rales   Cardiovascular system: S1 &S2+. No rubs or gallops Gastrointestinal system: Abdomen is nondistended, soft and nontender. Hypoactive bowel sounds heard. Central nervous system: Appears lethargic. Moves all 4 extremities  Extremities: left great toe amputation that is dressed. Right transmetatarsal amputation. Petechiae on LLE  Psychiatry: Judgement and insight appear abnormal      Data Reviewed: I have personally reviewed following labs and imaging studies  CBC: Recent Labs  Lab 03/12/2020 1834 03/10/20 0500 03/26/2020 0444 03/12/20 0338 03/13/20 0459  WBC 21.7* 27.6* 22.6* 22.9* 14.6*  HGB 8.3* 7.8* 8.2* 9.0* 8.3*  HCT 27.7* 25.6* 27.0* 31.3* 28.4*  MCV 85.5 84.2 84.4 86.2 87.1  PLT 284 186 129* 75*  PLATELET CLUMPS NOTED ON SMEAR, UNABLE TO ESTIMATE   Basic Metabolic Panel: Recent Labs  Lab 03/10/2020 1755 03/10/20 0500 04/01/2020 0444 03/12/20 0338  NA 134* 133* 135 139  K 4.5 3.7 3.8 3.9  CL 98 104 105 109  CO2 16* 17* 20* 17*  GLUCOSE 170* 172* 174* 138*  BUN 23 25* 36* 49*  CREATININE 1.86* 1.62* 1.68* 1.84*  CALCIUM 9.0 7.8* 7.6* 7.8*   GFR: Estimated Creatinine Clearance: 42.4 mL/min (A) (by C-G formula based on SCr of 1.84 mg/dL (H)). Liver Function Tests: Recent Labs  Lab 03/22/2020 1755 03/10/20 0500 03/03/2020 0444 03/12/20 0338  AST 46* 228* 2,306* 4,704*  ALT 48* 153* 1,641* 3,337*  ALKPHOS 108 90 92 104  BILITOT 1.4* 1.4* 1.7* 2.0*  PROT 9.6* 7.9 8.2* 8.1  ALBUMIN 3.0* 2.5* 2.6* 2.5*   No results for input(s): LIPASE, AMYLASE in the last 168 hours. Recent Labs  Lab 03/25/2020 1755  AMMONIA 50*   Coagulation Profile: Recent Labs  Lab 03/10/2020 1755 03/10/20  0500 03/12/20 0758 03/13/20 0459  INR 1.8* 2.7* 5.6* 6.4*   Cardiac Enzymes: No results for input(s): CKTOTAL, CKMB, CKMBINDEX, TROPONINI in the last 168 hours. BNP (last 3 results) No results for input(s): PROBNP in the last 8760 hours. HbA1C: No results for input(s): HGBA1C in the last 72 hours. CBG: Recent Labs  Lab 03/12/20 1143 03/12/20 1602 03/12/20 1945 03/12/20 2342 03/13/20 0423  GLUCAP 96 123* 141* 184* 223*   Lipid Profile: No results for input(s): CHOL, HDL, LDLCALC, TRIG, CHOLHDL, LDLDIRECT in the last 72 hours. Thyroid Function Tests: No results for input(s): TSH, T4TOTAL, FREET4, T3FREE, THYROIDAB in the last 72 hours. Anemia Panel: Recent Labs    03/13/20 0459  RETICCTPCT 2.3   Sepsis Labs: Recent Labs  Lab 03/21/2020 1755 03/27/2020 2000 03/19/2020 0444 03/12/20 0338  PROCALCITON <0.10  --  28.61 21.04  LATICACIDVEN 11.0* 2.9*  --   --     Recent Results (from the past 240 hour(s))  Aerobic/Anaerobic Culture (surgical/deep wound)     Status: None (Preliminary  result)   Collection Time: 03/02/2020 12:36 PM   Specimen: Wound  Result Value Ref Range Status   Specimen Description   Final    WOUND Performed at [facility], [address] [place], [place] [address]    Special Requests   Final    NONE Performed at [facility], [address] [place], [place] [address]    Gram Stain   Final    FEW WBC PRESENT, PREDOMINANTLY MONONUCLEAR FEW GRAM POSITIVE COCCI IN PAIRS IN CLUSTERS Performed at [facility], [place] [address] [place], [place] [address]    Culture   Final    MODERATE STAPHYLOCOCCUS AUREUS SUSCEPTIBILITIES TO FOLLOW NO ANAEROBES ISOLATED; CULTURE IN PROGRESS FOR 5 DAYS    Report Status PENDING  Incomplete  Blood Culture (routine x 2)     Status: Abnormal   Collection Time: 03/24/2020  5:55 PM   Specimen: BLOOD  Result Value Ref Range Status   Specimen Description   Final    BLOOD RIGHT ANTECUBITAL Performed at [facility], [address], [place], [place] [address]    Special Requests   Final    BOTTLES DRAWN AEROBIC AND ANAEROBIC Blood Culture results may not be optimal due to an excessive volume of blood received in culture bottles Performed at [facility], [address], [place], [place] [address]    Culture  Setup Time   Final    GRAM POSITIVE COCCI IN BOTH AEROBIC AND ANAEROBIC BOTTLES    Culture (A)  Final    STAPHYLOCOCCUS AUREUS SUSCEPTIBILITIES PERFORMED ON PREVIOUS CULTURE WITHIN THE LAST 5 DAYS. Performed at [facility], [employer] [address], [place], [place] [address]    Report Status 03/12/2020 FINAL  Final  Blood Culture (routine x 2)     Status: Abnormal   Collection Time: 03/24/2020  5:55 PM   Specimen: BLOOD  Result Value Ref Range Status   Specimen Description   Final    BLOOD LEFT ANTECUBITAL Performed at [facility], [address] [place], [place] [address]    Special Requests   Final    BOTTLES DRAWN AEROBIC AND ANAEROBIC Blood  Culture results may not be optimal due to an excessive volume of blood received in culture bottles Performed at [facility], [address] [place],  [address]    Culture  Setup Time   Final    ANAEROBIC BOTTLE ONLY  GRAM POSITIVE COCCI AEROBIC BOTTLE ONLY CRITICAL RESULT CALLED TO, READ BACK BY AND VERIFIED WITH: ALEX CHAPPELL AT 1017 0/9/21 SDR Performed at [facility], [place] [address], [place], [place] [address]    Culture METHICILLIN RESISTANT STAPHYLOCOCCUS AUREUS (A)  Final   Report Status 03/12/2020 FINAL  Final   Organism ID, Bacteria METHICILLIN RESISTANT STAPHYLOCOCCUS AUREUS  Final      Susceptibility   Methicillin resistant staphylococcus aureus - MIC*    CIPROFLOXACIN >=8 RESISTANT Resistant     ERYTHROMYCIN >=8 RESISTANT Resistant     GENTAMICIN <=0.5 SENSITIVE Sensitive     OXACILLIN >=4 RESISTANT Resistant     TETRACYCLINE <=1 SENSITIVE Sensitive     VANCOMYCIN <=0.5 SENSITIVE Sensitive     TRIMETH/SULFA <=10 SENSITIVE Sensitive     CLINDAMYCIN >=8 RESISTANT Resistant     RIFAMPIN <=0.5 SENSITIVE Sensitive     Inducible Clindamycin NEGATIVE Sensitive     * METHICILLIN RESISTANT STAPHYLOCOCCUS AUREUS  Blood Culture ID Panel (Reflexed)     Status: Abnormal   Collection Time: 03/27/2020  5:55 PM  Result Value Ref Range Status   Enterococcus faecalis NOT DETECTED NOT DETECTED Final   Enterococcus Faecium NOT DETECTED NOT DETECTED Final   Listeria monocytogenes NOT DETECTED NOT DETECTED Final   Staphylococcus species DETECTED (A) NOT DETECTED Final    Comment: CRITICAL RESULT CALLED TO, READ BACK BY AND VERIFIED WITH:  ALEX CHAPPELL AT 1017 03/10/20 SDR    Staphylococcus aureus (BCID) DETECTED (A) NOT DETECTED Final    Comment: Methicillin (oxacillin)-resistant Staphylococcus aureus (MRSA). MRSA is predictably resistant to beta-lactam antibiotics (except ceftaroline). Preferred therapy is vancomycin unless clinically contraindicated. Patient  requires contact precautions if  hospitalized. CRITICAL RESULT CALLED TO, READ BACK BY AND VERIFIED WITH:  ALEX CHAPPELL AT 1017 03/10/20 SDR    Staphylococcus epidermidis NOT DETECTED NOT DETECTED Final   Staphylococcus lugdunensis NOT DETECTED NOT DETECTED Final   Streptococcus species NOT DETECTED NOT DETECTED Final   Streptococcus agalactiae NOT DETECTED NOT DETECTED Final   Streptococcus pneumoniae NOT DETECTED NOT DETECTED Final   Streptococcus pyogenes NOT DETECTED NOT DETECTED Final   A.calcoaceticus-baumannii NOT DETECTED NOT DETECTED Final   Bacteroides fragilis NOT DETECTED NOT DETECTED Final   Enterobacterales NOT DETECTED NOT DETECTED Final   Enterobacter cloacae complex NOT DETECTED NOT DETECTED Final   Escherichia coli NOT DETECTED NOT DETECTED Final   Klebsiella aerogenes NOT DETECTED NOT DETECTED Final   Klebsiella oxytoca NOT DETECTED NOT DETECTED Final   Klebsiella pneumoniae NOT DETECTED NOT DETECTED Final   Proteus species NOT DETECTED NOT DETECTED Final   Salmonella species NOT DETECTED NOT DETECTED Final   Serratia marcescens NOT DETECTED NOT DETECTED Final   Haemophilus influenzae NOT DETECTED NOT DETECTED Final   Neisseria meningitidis NOT DETECTED NOT DETECTED Final   Pseudomonas aeruginosa NOT DETECTED NOT DETECTED Final   Stenotrophomonas maltophilia NOT DETECTED NOT DETECTED Final   Candida albicans NOT DETECTED NOT DETECTED Final   Candida auris NOT DETECTED NOT DETECTED Final   Candida glabrata NOT DETECTED NOT DETECTED Final   Candida krusei NOT DETECTED NOT DETECTED Final   Candida parapsilosis NOT DETECTED NOT DETECTED Final   Candida tropicalis NOT DETECTED NOT DETECTED Final   Cryptococcus neoformans/gattii NOT DETECTED NOT DETECTED Final   Meth resistant mecA/C and MREJ DETECTED (A) NOT DETECTED Final    Comment: CRITICAL RESULT CALLED TO, READ BACK BY AND VERIFIED WITH:  ALEX CHAPPELL AT 1017 03/10/20 SDR Performed at [facility], [place], [place] [address]   Respiratory Panel by RT PCR (Flu A&B, Covid) - Nasopharyngeal Swab     Status: None   Collection Time: 03/16/2020  6:21 PM   Specimen: Nasopharyngeal Swab  Result Value Ref Range Status   SARS Coronavirus 2 by RT PCR NEGATIVE NEGATIVE Final    Comment: (NOTE) SARS-CoV-2 target nucleic acids are NOT DETECTED.  The SARS-CoV-2 RNA is generally detectable in upper respiratoy specimens during the acute phase of infection. The lowest concentration of SARS-CoV-2 viral copies this assay can detect is 131 copies/mL. A negative result does not preclude SARS-Cov-2 infection and should not be used as the sole basis for treatment or other patient management decisions. A negative result may occur with  improper specimen collection/handling, submission of specimen other than nasopharyngeal swab, presence of viral mutation(s) within the areas targeted by this assay, and inadequate number of viral copies (<131 copies/mL). A negative result must be combined with clinical observations, patient history, and epidemiological information. The expected result is Negative.  Fact Sheet for Patients:  PinkCheek.be  Fact Sheet for Healthcare Providers:  GravelBags.it  This test is no t yet approved or cleared by the [place] FDA and  has been authorized for detection and/or diagnosis of SARS-CoV-2 by FDA under an Emergency Use Authorization (EUA). This EUA will remain  in effect (meaning this test can be used) for the duration of the COVID-19 declaration under Section 564(b)(1) of the Act, 21 U.S.C. section 360bbb-3(b)(1), unless the authorization is terminated or revoked sooner.     Influenza A by PCR NEGATIVE NEGATIVE Final   Influenza B by PCR NEGATIVE NEGATIVE Final    Comment: (NOTE) The Xpert Xpress SARS-CoV-2/FLU/RSV assay is intended as an aid in  the diagnosis of influenza from Nasopharyngeal  swab specimens and  should not be used as a sole basis for treatment. Nasal washings and  aspirates are unacceptable for Xpert Xpress SARS-CoV-2/FLU/RSV  testing.  Fact Sheet for Patients: PinkCheek.be  Fact Sheet for Healthcare Providers: GravelBags.it  This test is not yet approved or cleared by the [place] FDA and  has been authorized for detection and/or diagnosis of SARS-CoV-2 by  FDA under an Emergency Use Authorization (EUA). This EUA will remain  in effect (meaning this test can be used) for the duration of the  Covid-19 declaration under Section 564(b)(1) of the Act, 21  U.S.C. section 360bbb-3(b)(1), unless the authorization is  terminated or revoked. Performed at [facility], [address], [place], [place] [address]   Urine culture     Status: None   Collection Time: 03/08/2020  9:06 PM   Specimen: In/Out Cath Urine  Result Value Ref Range Status   Specimen Description   Final    IN/OUT CATH URINE Performed at [facility], [address], [place], [place] [address]    Special Requests   Final    NONE Performed at [facility], [address] [place], [place] [address]    Culture   Final    NO GROWTH Performed at [facility], [place] [address], [place], [place] [address]    Report Status 03/13/2020 FINAL  Final  Aerobic/Anaerobic Culture (surgical/deep wound)     Status: None (Preliminary result)   Collection Time: 03/10/20 10:55 AM   Specimen: Tissue; Wound  Result Value Ref Range Status   Specimen Description   Final    TISSUE Performed at [facility], [address] [place], [place] [address]    Special Requests   Final    LEFT PARTIAL FIRST RAY AMPUTATION Performed at [facility], [place], [place] [address]    Gram Stain   Final    RARE WBC PRESENT, PREDOMINANTLY PMN RARE GRAM POSITIVE COCCI    Culture   Final     FEW STAPHYLOCOCCUS AUREUS SUSCEPTIBILITIES TO FOLLOW Performed at [facility], [place] [address] [place], [place] [address]    Report Status PENDING  Incomplete  MRSA PCR Screening     Status: Abnormal   Collection Time: 03/10/20  4:21 PM   Specimen: Nasopharyngeal  Result Value Ref Range Status   MRSA by PCR POSITIVE (A) NEGATIVE Final    Comment:        The GeneXpert MRSA Assay (FDA approved for NASAL specimens only), is one component of a comprehensive MRSA colonization surveillance program. It is not intended to diagnose MRSA infection nor to guide or monitor treatment for MRSA infections. RESULT CALLED TO, READ BACK BY AND VERIFIED WITH:  [facility] HARRIS AT 1729 03/10/20 SDR Performed at [facility], [address] [place], [place] [address]   CULTURE, BLOOD (ROUTINE X 2) w Reflex to ID Panel     Status: None (Preliminary result)   Collection Time: 03/24/2020  2:50 PM   Specimen: BLOOD  Result Value Ref Range Status   Specimen Description BLOOD BLOOD RIGHT HAND  Final   Special Requests   Final    BOTTLES DRAWN AEROBIC AND ANAEROBIC Blood Culture adequate volume   Culture   Final    NO GROWTH 2 DAYS Performed at [facility], [address], [place], [place] [address]    Report Status PENDING  Incomplete  CULTURE, BLOOD (ROUTINE X 2) w Reflex to ID Panel     Status: None (Preliminary result)   Collection Time: 03/26/2020  2:59 PM   Specimen: BLOOD  Result Value Ref Range Status   Specimen Description BLOOD BLOOD RIGHT HAND  Final   Special Requests   Final    BOTTLES DRAWN AEROBIC AND ANAEROBIC Blood Culture adequate volume   Culture   Final    NO GROWTH 2 DAYS Performed at [facility], [address], [place],  [address]    Report Status PENDING  Incomplete         Radiology Studies: DG Chest Port 1 View  Result Date: 03/12/2020 CLINICAL DATA:  Tachypnea. EXAM: PORTABLE CHEST 1 VIEW COMPARISON:  March 09, 2020.  FINDINGS: Stable cardiomegaly. Status post coronary bypass graft. Left-sided pacemaker is unchanged in position. No pneumothorax or pleural effusion is noted. Stable minimal to mild bibasilar opacities are noted concerning for scarring or atelectasis. Bony thorax is unremarkable. IMPRESSION: No active disease. Electronically Signed   By: Marijo Conception M.D.   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 Remaining osseous structures appear intact and unchanged in appearance. No additional sites of bony erosion or cortical destruction. Expected postoperative changes within the soft tissues at the resection site. Advanced small vessel vascular calcifications. 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 Electronically Signed   By: Davina Poke D.O.   On: 03/12/2020 08:29   US Abdomen Limited RUQ  Result  Date: 03/30/2020 CLINICAL DATA:  Elevated liver enzymes. EXAM: ULTRASOUND ABDOMEN LIMITED RIGHT UPPER QUADRANT COMPARISON:  None. FINDINGS: Gallbladder: No gallstones or wall thickening visualized. No sonographic Murphy sign noted by sonographer. Common bile duct: Diameter: 3 mm, within normal limits. Liver: No focal lesion identified. Within normal limits in parenchymal echogenicity. Portal vein is patent on color Doppler imaging with normal direction of blood flow towards the liver. Other: Right pleural effusion incidentally noted. IMPRESSION: No hepatobiliary abnormality identified. Right pleural effusion incidentally noted. Electronically Signed   By: Marlaine Hind M.D.   On: 03/07/2020 19:31        Scheduled Meds: . aspirin EC  81 mg Oral Daily  . Chlorhexidine Gluconate Cloth  6 each Topical Daily  . cholecalciferol  1,000 Units Oral  Daily  . DULoxetine  20 mg Oral Daily  . famotidine  20 mg Oral Daily  . insulin aspart  0-9 Units Subcutaneous Q4H  . methylPREDNISolone (SOLU-MEDROL) injection  40 mg Intravenous Q12H  . mupirocin ointment  1 application Nasal BID  . pantoprazole (PROTONIX) IV  40 mg Intravenous Q24H  . phytonadione  10 mg Subcutaneous Once   Continuous Infusions: . sodium chloride 100 mL/hr at 03/12/20 2235  . sodium chloride Stopped (03/10/20 0854)  . norepinephrine (LEVOPHED) Adult infusion Stopped (03/10/20 1420)  . vancomycin Stopped (03/13/20 0346)     LOS: 4 days    Time spent: 30 mins     Wyvonnia Dusky, MD [facility] Pager [phone]  If 7PM-7AM, please contact night-coverage [email or web address] 03/13/2020, 7:40 AM

## 2020-03-13 NOTE — Progress Notes (Addendum)
Date of Admission:  03/15/2020     Subjective: None available  Medications:  . aspirin EC  81 mg Oral Daily  . Chlorhexidine Gluconate Cloth  6 each Topical Daily  . cholecalciferol  1,000 Units Oral Daily  . DULoxetine  20 mg Oral Daily  . famotidine  20 mg Oral Daily  . insulin aspart  0-9 Units Subcutaneous Q4H  . methylPREDNISolone (SOLU-MEDROL) injection  40 mg Intravenous Q12H  . mupirocin ointment  1 application Nasal BID  . pantoprazole (PROTONIX) IV  40 mg Intravenous Q24H    Objective: Vital signs in last 24 hours: Temp:  [97.6 F (36.4 C)-98.4 F (36.9 C)] 97.6 F (36.4 C) (10/12 1637) Pulse Rate:  [30-88] 81 (10/12 1637) Resp:  [16-25] 16 (10/12 1637) BP: (100-134)/(58-75) 117/63 (10/12 1637) SpO2:  [98 %-100 %] 98 % (10/12 1637) Weight:  [86.4 kg] 86.4 kg (10/12 0420)  PHYSICAL EXAM:  General: obtunded Barely opens eyes to calling hs name PERL Lungs: b/l air entry Heart: irregualr - well controlled Abdomen: Soft, non-tender,not distended. Bowel sounds normal. No masses Extremities: rt TMA        Skin: No rashes or lesions. Or bruising Lymph: Cervical, supraclavicular normal. Neurologic: obtunded- spontaneously moves upper extremities  Lab Results Recent Labs    03/12/20 0338 03/13/20 0459  WBC 22.9* 14.6*  HGB 9.0* 8.3*  HCT 31.3* 28.4*  NA 139 139  K 3.9 4.2  CL 109 111  CO2 17* 13*  BUN 49* 59*  CREATININE 1.84* 1.87*   Liver Panel Recent Labs    03/12/20 0338 03/13/20 0459  PROT 8.1 7.8  ALBUMIN 2.5* 2.5*  AST 4,704* 2,756*  ALT 3,337* 2,825*  ALKPHOS 104 113  BILITOT 2.0* 2.5*   Sedimentation Rate No results for input(s): ESRSEDRATE in the last 72 hours. C-Reactive Protein No results for input(s): CRP in the last 72 hours.  Microbiology:  Studies/Results: DG Chest Port 1 View  Result Date: 03/12/2020 CLINICAL DATA:  Tachypnea. EXAM: PORTABLE CHEST 1 VIEW COMPARISON:  March 09, 2020. FINDINGS: Stable  cardiomegaly. Status post coronary bypass graft. Left-sided pacemaker is unchanged in position. No pneumothorax or pleural effusion is noted. Stable minimal to mild bibasilar opacities are noted concerning for scarring or atelectasis. Bony thorax is unremarkable. IMPRESSION: No active disease. Electronically Signed   By: Marijo Conception M.D.   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 Remaining osseous structures appear intact and unchanged in appearance. No additional sites of bony erosion or cortical destruction. Expected postoperative changes within the soft tissues at the resection site. Advanced small vessel vascular calcifications. 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 Electronically Signed   By: Davina Poke D.O.   On: 03/12/2020 08:29    Mild age-related atrophy and chronic microvascular ischemic changes. 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   Assessment/Plan: Septic shock with MRSA bacteremia. Shock resolved source is the left great toe ulcer with  chronic osteomyelitis- s/p ray excision on 04/01/2020 and bone culture is MRSA Patient has a pacemaker and hence concern for endocarditis 2D echo done on 03/10/2020 showed an EF of 40 to 45%. The aortic valve was not well visualized. The mitral valve was normal in structure. Limited  study secondary to patient terminating study prematurely. Will need TEE but not stable to get it Continue vanco ( monitor closely)   Altered mental status- encephalopathy ? Due to liver failure - watch for ICH CKD-  ? Left great toe amputaion site infection with staph- underwent ray excision 03/10/2020  Left heel ulcer- this could be due to MRSA-  as well- need culture  Acutely increased transaminases > 4000- could be from shock liver, sepsis ( was also started on amiodarone in Sept 2021during last admission- could that have contributed as well??)   with high INR, High PTT he has acute liver failure- GI is being consulted   Thrombocytopenia, with abnormal coags could be DIC or liver failure Risk for intracranial bleed   Anemia  Hypoalbuminemia with anasarca. Colon wall edema and mesenteric edema is all likely related to the hypoalbuminemia. I doubt he has infectious colitis. Abdomen soft, no diarrhea   Even though he has bilateral perinephric stranding his urine examination does not show any evidence of infection. This likely would be again due to intra-abdominal edema from hypoalbuminemia.   H/O fall 6/14-6/24/21 11/16/19- fall intertrochanteric fracture RT Intramedullary fixation of rt intertrochanteric fracture  01/11/20- admitted for  OD on tramdaol, flexeril and gabepentin  Cardiac arrest while running 5k and anoxic brain injury was in coma for 4 months 5 years ago  H/o CVA   Discussed the management with his daughter Nira Conn today. She would like to talk to palliative . Will let the hospitalist know tomorrow

## 2020-03-13 NOTE — Progress Notes (Signed)
Hematology/Oncology Consult note [facility]  Telephone:([phone] Fax:([phone]  Patient Care Team: [facility], [facility] as PCP - General   Name of the patient: Jason Montes  [id number]  01-11-1951   Date of visit: 03/13/2020   Interval history-patient has been moved her tooth of the ICU to a regular floor.  He continues to have acute delirium and altered mental status   Review of systems- Review of Systems  Unable to perform ROS: Mental acuity      Allergies  Allergen Reactions  . Amlodipine Other (See Comments)  . Penicillins     Childhood allergy, not sure what happens  Other reaction(s): Other, Other  . Norvasc [Amlodipine Besylate]     Unknown     Past Medical History:  Diagnosis Date  . Allergy   . Atrial fibrillation ([place])   . B12 deficiency   . Chronic kidney disease   . Chronic kidney disease (CKD), stage II (mild)   . Coronary atherosclerosis   . Diabetes mellitus without complication ([place])   . Elevated PSA   . Esophagitis   . GERD (gastroesophageal reflux disease)   . Heart attack ([place])   . Heart disease   . Hyperlipidemia   . Hypertension   . Hypokalemia   . Mild neurocognitive disorder   . Osteoarthritis   . Presbyopia   . PVD (peripheral vascular disease) ([place])   . Restless leg syndrome   . Stroke (cerebrum) ([place])   . Stroke ([place])   . Subacute osteomyelitis ([place])   . Tinea unguium   . Uncompensated short term memory deficit      Past Surgical History:  Procedure Laterality Date  . AMPUTATION Left 03/08/2020   Procedure: LEFT PARTIAL FIRST  RAY AMPUTATION;  Surgeon: Caroline More, DPM;  Location: [facility] ORS;  Service: Podiatry;  Laterality: Left;  . AMPUTATION TOE Left 01/13/2020   Procedure: AMPUTATION RAY LEFT 1ST;  Surgeon: Caroline More, DPM;  Location: [facility] ORS;  Service: Podiatry;  Laterality: Left;  . CARDIAC PACEMAKER PLACEMENT    . COLONOSCOPY WITH PROPOFOL N/A 05/18/2018   Procedure:  COLONOSCOPY WITH PROPOFOL;  Surgeon: [name], MD;  Location: [facility] ENDOSCOPY;  Service: Endoscopy;  Laterality: N/A;  . CORONARY ARTERY BYPASS GRAFT    . FOOT AMPUTATION Right   . INTRAMEDULLARY (IM) NAIL INTERTROCHANTERIC Right 11/16/2019   Procedure: INTRAMEDULLARY (IM) NAIL INTERTROCHANTRIC;  Surgeon: Thornton Park, MD;  Location: [facility] ORS;  Service: Orthopedics;  Laterality: Right;  . LOWER EXTREMITY ANGIOGRAPHY Left 10/10/2019   Procedure: Lower Extremity Angiography;  Surgeon: Algernon Huxley, MD;  Location: [place] CV LAB;  Service: Cardiovascular;  Laterality: Left;  . LOWER EXTREMITY ANGIOGRAPHY Left 01/16/2020   Procedure: Lower Extremity Angiography;  Surgeon: Algernon Huxley, MD;  Location: [place] CV LAB;  Service: Cardiovascular;  Laterality: Left;    Social History   Socioeconomic History  . Marital status: Single    Spouse name: Not on file  . Number of children: Not on file  . Years of education: Not on file  . Highest education level: Not on file  Occupational History  . Not on file  Tobacco Use  . Smoking status: Never Smoker  . Smokeless tobacco: Never Used  Vaping Use  . Vaping Use: Never used  Substance and Sexual Activity  . Alcohol use: Never  . Drug use: Never  . Sexual activity: Not on file  Other Topics Concern  . Not on file  Social History Narrative  . Not on file   Social Determinants of Health   Financial Resource Strain:   . Difficulty of Paying Living Expenses: Not on file  Food Insecurity:   . Worried About Charity fundraiser in the Last Year: Not on file  . Ran Out of Food in the Last Year: Not on file  Transportation Needs:   . Lack of Transportation (Medical): Not on file  . Lack of Transportation (Non-Medical): Not on file  Physical Activity:   . Days of Exercise per Week: Not on file  . Minutes of Exercise per Session: Not on file  Stress:   . Feeling of Stress : Not on file  Social Connections:   . Frequency of  Communication with Friends and Family: Not on file  . Frequency of Social Gatherings with Friends and Family: Not on file  . Attends Religious Services: Not on file  . Active Member of Clubs or Organizations: Not on file  . Attends Archivist Meetings: Not on file  . Marital Status: Not on file  Intimate Partner Violence:   . Fear of Current or Ex-Partner: Not on file  . Emotionally Abused: Not on file  . Physically Abused: Not on file  . Sexually Abused: Not on file    Family History  Problem Relation Age of Onset  . Heart failure Mother   . Colon cancer Mother   . Diabetes Mother   . Thyroid disease Sister      Current Facility-Administered Medications:  .  0.9 %  sodium chloride infusion, , Intravenous, Continuous, Caroline More, DPM, Last Rate: 100 mL/hr at 03/12/20 2235, New Bag at 03/12/20 2235 .  0.9 %  sodium chloride infusion, 250 mL, Intravenous, Continuous, Caroline More, DPM, Stopped at 03/10/20 [phone] .  acetaminophen (TYLENOL) tablet 650 mg, 650 mg, Oral, Q6H PRN **OR** acetaminophen (TYLENOL) suppository 650 mg, 650 mg, Rectal, Q6H PRN, Caroline More, DPM .  aspirin EC tablet 81 mg, 81 mg, Oral, Daily, Caroline More, DPM, 81 mg at 03/13/20 1037 .  Chlorhexidine Gluconate Cloth 2 % PADS 6 each, 6 each, Topical, Daily, Caroline More, DPM, 6 each at 03/13/20 1033 .  cholecalciferol (VITAMIN D) tablet 1,000 Units, 1,000 Units, Oral, Daily, Caroline More, DPM, 1,000 Units at 03/13/20 1037 .  DULoxetine (CYMBALTA) DR capsule 20 mg, 20 mg, Oral, Daily, Caroline More, DPM, 20 mg at 03/13/20 1037 .  famotidine (PEPCID) tablet 20 mg, 20 mg, Oral, Daily, Caroline More, DPM, 20 mg at 03/13/20 1037 .  insulin aspart (novoLOG) injection 0-9 Units, 0-9 Units, Subcutaneous, Q4H, Caroline More, DPM, 3 Units at 03/13/20 1253 .  LORazepam (ATIVAN) injection 1 mg, 1 mg, Intravenous, Q6H PRN, Caroline More, DPM, 1 mg at 03/12/20 1434 .  methylPREDNISolone sodium succinate  (SOLU-MEDROL) 40 mg/mL injection 40 mg, 40 mg, Intravenous, Q12H, Caroline More, DPM, 40 mg at 03/13/20 1037 .  mupirocin ointment (BACTROBAN) 2 % 1 application, 1 application, Nasal, BID, Caroline More, DPM, 1 application at 52/77/82 1024 .  ondansetron (ZOFRAN) tablet 4 mg, 4 mg, Oral, Q6H PRN **OR** ondansetron (ZOFRAN) injection 4 mg, 4 mg, Intravenous, Q6H PRN, Caroline More, DPM .  pantoprazole (PROTONIX) injection 40 mg, 40 mg, Intravenous, Q24H, Caroline More, DPM, 40 mg at 03/12/20 2029 .  vancomycin (VANCOREADY) IVPB 750 mg/150 mL, 750 mg, Intravenous, Q12H, Oswald Hillock, RPH, Last Rate: 150 mL/hr at 03/13/20 1255, 750 mg at 03/13/20 1255  Physical exam:  Vitals:  03/13/20 0420 03/13/20 0725 03/13/20 0810 03/13/20 1145  BP: 118/63 (!) 133/58 116/60 134/63  Pulse: 86 86 88 88  Resp: 19 17 16 18   Temp: 97.7 F (36.5 C) 97.8 F (36.6 C) 97.7 F (36.5 C) 98.4 F (36.9 C)  TempSrc: Oral Oral Oral Axillary  SpO2: 100% 99% 99% 100%  Weight: 190 lb 8 oz (86.4 kg)     Height:       Physical Exam Constitutional:      Comments: Eyes closed and patient muttering to himself.  He has acute delirium  Cardiovascular:     Rate and Rhythm: Normal rate and regular rhythm.     Heart sounds: Normal heart sounds.  Pulmonary:     Effort: Pulmonary effort is normal.     Breath sounds: Normal breath sounds.  Abdominal:     General: Bowel sounds are normal.     Palpations: Abdomen is soft.  Musculoskeletal:     Comments: Bilateral lower extremity dressing in place  Skin:    General: Skin is warm and dry.      CMP Latest Ref Rng & Units 03/13/2020  Glucose 70 - 99 mg/dL 218(H)  BUN 8 - 23 mg/dL 59(H)  Creatinine 0.61 - 1.24 mg/dL 1.87(H)  Sodium 135 - 145 mmol/L 139  Potassium 3.5 - 5.1 mmol/L 4.2  Chloride 98 - 111 mmol/L 111  CO2 22 - 32 mmol/L 13(L)  Calcium 8.9 - 10.3 mg/dL 7.9(L)  Total Protein 6.5 - 8.1 g/dL 7.8  Total Bilirubin 0.3 - 1.2 mg/dL 2.5(H)  Alkaline Phos 38 -  126 U/L 113  AST 15 - 41 U/L 2,756(H)  ALT 0 - 44 U/L 2,825(H)   CBC Latest Ref Rng & Units 03/13/2020  WBC 4.0 - 10.5 K/uL 14.6(H)  Hemoglobin 13.0 - 17.0 g/dL 8.3(L)  Hematocrit 39 - 52 % 28.4(L)  Platelets 150 - 400 K/uL PLATELET CLUMPS NOTED ON SMEAR, UNABLE TO ESTIMATE    @IMAGES @  CT ABDOMEN PELVIS WO CONTRAST  Result Date: 03/05/2020 CLINICAL DATA:  Abnormal LFTs, possible sepsis EXAM: CT ABDOMEN AND PELVIS WITHOUT CONTRAST TECHNIQUE: Multidetector CT imaging of the abdomen and pelvis was performed following the standard protocol without IV contrast. COMPARISON:  CT 04/08/2019 FINDINGS: Motion degraded imaging of the lower chest and upper abdomen. Imaging quality further degraded due to increased image noise from the patient's arms over the upper abdomen. Lower chest: Small bilateral effusions. Adjacent atelectatic changes are present. Some additional heterogenous opacity could reflect further atelectasis at, edema or early consolidation. Evidence of prior sternotomy and CABG. Extensive calcification of the native coronary arteries. Pacer lead terminates at the cardiac apex. No pericardial effusion. Hepatobiliary: No visible focal liver lesion on this unenhanced CT. Some mild perihepatic hazy stranding may be present though this is possibly accentuated by extensive motion artifact and streak in the upper abdomen. No focal liver lesion is seen. High attenuation material seen within the gallbladder lumen, possibly biliary sand versus vicarious excretion of contrast though should correlate for recent contrast enhanced studies, none available at the time of interpretation. No focal pericholecystic inflammation is seen. No biliary ductal dilatation or visible intraductal gallstones. Pancreas: Unremarkable. No pancreatic ductal dilatation or surrounding inflammatory changes. Spleen: Normal in size. No concerning splenic lesions. Adrenals/Urinary Tract: Normal adrenal glands. Extensive bilateral  perinephric stranding is increased from comparison imaging. No discernible focal renal lesion. Renal vascular calcifications. No urolithiasis or hydronephrosis. Circumferential bladder wall thickening is present with some focal thickening towards the anterosuperior  bladder dome, possibly reactive. Stomach/Bowel: Small hiatal hernia. Stomach mildly distended with air and ingested fluid. No significant small bowel thickening or dilatation. Diffuse pancolonic edematous mural thickening with faint hazy pericolonic stranding. No evidence of obstruction. High attenuation fecal material, could reflect ingested bismuth containing products, inspissated stool or previously administered enteric contrast media. Vascular/Lymphatic: Atherosclerotic calcifications within the abdominal aorta and branch vessels. No aneurysm or ectasia. No enlarged abdominopelvic lymph nodes. Some edematous periaortic nodes are present. No pathologically enlarged abdominopelvic lymph nodes. Reproductive: Mild prostatomegaly. Seminal vesicles are unremarkable. Other: Diffuse body wall edema. Edematous changes in the central mesentery. Trace free fluid in the pelvis. No free air. No organized collection or abscess. Tiny fat containing right inguinal hernia. No bowel containing hernias. Musculoskeletal: Multilevel degenerative changes are present in the imaged portions of the spine. Additional moderate degenerative changes in the hips as well as fusion across the bilateral SI joints. Prior sternotomy. Prior right femoral intramedullary nail and transcervical pin. No acute osseous abnormality or suspicious osseous lesion. IMPRESSION: 1. Diffuse pancolonic edematous mural thickening with faint hazy pericolonic stranding, consistent with a nonspecific colitis of infectious or inflammatory etiology. 2. Extensive bilateral perinephric stranding is increased from comparison imaging. This is nonspecific but can be seen in the setting of ascending urinary tract  infection given the circumferential bladder wall thickening as well. Recommend correlation with urinalysis. 3. Small bilateral effusions with adjacent atelectatic changes. Some additional heterogenous opacity could reflect further atelectasis, edema or early consolidation. 4. High attenuation material within the gallbladder lumen, possibly biliary sand versus vicarious excretion of contrast though no recent contrast enhanced study is seen in the patient's chart. Correlate with history. Could consider right upper quadrant ultrasound. 5. Some hazy stranding about the liver, nonspecific and possibly accentuated by motion artifact and image noise though can be seen in the setting of hepatitis, given elevated LFTs. 6. Additional features of anasarca with body wall edema. 7. Aortic Atherosclerosis (ICD10-I70.0). Electronically Signed   By: Lovena Le M.D.   On: 03/13/2020 21:44   CT Head Wo Contrast  Result Date: 03/21/2020 CLINICAL DATA:  69 year old male with altered mental status. EXAM: CT HEAD WITHOUT CONTRAST TECHNIQUE: Contiguous axial images were obtained from the base of the skull through the vertex without intravenous contrast. COMPARISON:  Head CT dated 01/23/2020. FINDINGS: Brain: There is mild age-related atrophy and chronic microvascular ischemic changes. 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 There is no acute intracranial hemorrhage. No mass effect or midline shift. No extra-axial fluid collection. Vascular: No hyperdense vessel or unexpected calcification. Skull: Normal. Negative for fracture or focal lesion. Sinuses/Orbits: No acute finding. Other: None IMPRESSION: 1. No acute intracranial hemorrhage. 2. Mild age-related atrophy and chronic microvascular ischemic changes. 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 Electronically Signed   By: Anner Crete M.D.   On: 03/30/2020 18:03   DG Chest Port 1 View  Result  Date: 03/12/2020 CLINICAL DATA:  Tachypnea. EXAM: PORTABLE CHEST 1 VIEW COMPARISON:  March 09, 2020. FINDINGS: Stable cardiomegaly. Status post coronary bypass graft. Left-sided pacemaker is unchanged in position. No pneumothorax or pleural effusion is noted. Stable minimal to mild bibasilar opacities are noted concerning for scarring or atelectasis. Bony thorax is unremarkable. IMPRESSION: No active disease. Electronically Signed   By: Marijo Conception M.D.   On: 03/12/2020 08:41   DG Chest Port 1 View  Result Date: 03/07/2020 CLINICAL DATA:  Possible sepsis EXAM: PORTABLE CHEST  1 VIEW COMPARISON:  Radiograph 01/17/2020 FINDINGS: Mild airways thickening with some increasing interstitial and patchy opacities in lung bases and retrocardiac space. Background of chronically coarsened interstitial features. No pneumothorax or visible effusion. Pacer/AICD overlies the left chest wall. Post sternotomy and CABG changes likely with abandoned epicardial pacer leads in the upper abdomen. Chronic cardiomegaly with a calcified aorta, not significantly changed accounting for differences in technique. The osseous structures appear diffusely demineralized which may limit detection of small or nondisplaced fractures. No acute osseous or soft tissue abnormality. Moderate to severe degenerative changes are present in the imaged spine and shoulders. IMPRESSION: 1. Mild airways thickening with some increasing interstitial and patchy opacities in the lung bases and retrocardiac space. Could reflect developing infection or edema. 2. Chronic interstitial changes. 3. Cardiomegaly similar to prior. Prior sternotomy and CABG. 4.  Aortic Atherosclerosis (ICD10-I70.0). Electronically Signed   By: Lovena Le M.D.   On: 03/06/2020 18:20   DG Foot Complete Left  Result Date: 03/12/2020 CLINICAL DATA:  Partial first ray amputation EXAM: LEFT FOOT - COMPLETE 3+ VIEW COMPARISON:  03/10/2020 FINDINGS: Interval transmetatarsal amputation  of the first ray at the level of the proximal metaphysis. Remaining osseous structures appear intact and unchanged in appearance. No additional sites of bony erosion or cortical destruction. Expected postoperative changes within the soft tissues at the resection site. Advanced small vessel vascular calcifications. 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 Electronically Signed   By: Davina Poke D.O.   On: 03/12/2020 08:29   DG Foot Complete Left  Result Date: 03/10/2020 CLINICAL DATA:  [place] EXAM: LEFT FOOT - COMPLETE 3+ VIEW COMPARISON:  January 13, 2020 FINDINGS: Status post amputation of LEFT great toe. Osteopenia. There is a new erosion of the medial aspect of the first metatarsal. There is increased lucency along medial aspect of the first metatarsal. The asymmetric sclerosis of the first metatarsal head, similar comparison to prior. Increased irregularity and cortical indistinctness of the lateral hallux sesamoid. Vascular calcifications. Scattered midfoot degenerative changes. Enthesophyte of the Achilles tendon. IMPRESSION: 1. New erosion of the medial aspect of the first metatarsal with increased lucency along the medial aspect of the first metatarsal, concerning for osteomyelitis. 2. Increased irregularity and cortical indistinctness of the lateral hallux sesamoid, suspicious for osteomyelitis. Electronically Signed   By: Valentino Saxon MD   On: 03/10/2020 14:20   VAS [place] ABI WITH/WO TBI  Result Date: 03/06/2020 LOWER EXTREMITY DOPPLER STUDY Indications: Peripheral artery disease, and S/P Angioplasty.  Vascular Interventions: 10/10/2019: Aortogram and Selective Left Lower Extremity                         Angiogram. PTA of the Left ATA. PTA of the Left Peroneal                         Artery. PTA of the Left Popliteal Artery.                          01/16/2020: Aortogram and Selective Left Lower Extremity                         Angiogram including selective  images of the Left ATA and                         Peroneal Artery. PTA of the  Left Anterior Tibial Artery.                         PTA of the Left Peroneal Artery. Performing Technologist: Almira Coaster RVS  Examination Guidelines: A complete evaluation includes at minimum, Doppler waveform signals and systolic blood pressure reading at the level of bilateral brachial, anterior tibial, and posterior tibial arteries, when vessel segments are accessible. Bilateral testing is considered an integral part of a complete examination. Photoelectric Plethysmograph (PPG) waveforms and toe systolic pressure readings are included as required and additional duplex testing as needed. Limited examinations for reoccurring indications may be performed as noted.  ABI Findings: +---------+------------------+-----+---------+-------------------+ Right    Rt Pressure (mmHg)IndexWaveform Comment             +---------+------------------+-----+---------+-------------------+ Brachial 141                                                 +---------+------------------+-----+---------+-------------------+ ATA      250               1.77 triphasic                    +---------+------------------+-----+---------+-------------------+ PTA      250               1.77 triphasic                    +---------+------------------+-----+---------+-------------------+ Great Toe                                Amputated Half Foot +---------+------------------+-----+---------+-------------------+ +---------+------------------+-----+---------+-------------------------------+ Left     Lt Pressure (mmHg)IndexWaveform Comment                         +---------+------------------+-----+---------+-------------------------------+ ATA      250               1.77 triphasic                                +---------+------------------+-----+---------+-------------------------------+ PTA      250               1.77  triphasic                                +---------+------------------+-----+---------+-------------------------------+ Great Toe                                Great Toe and 2nd Toe Amputated +---------+------------------+-----+---------+-------------------------------+ +-------+-----------+-----------+------------+------------+ ABI/TBIToday's ABIToday's TBIPrevious ABIPrevious TBI +-------+-----------+-----------+------------+------------+ Right  >1.0 [place]                                        +-------+-----------+-----------+------------+------------+ Left   >1.0 [place]                                        +-------+-----------+-----------+------------+------------+  Summary: Right: Resting right ankle-brachial  index indicates noncompressible right lower extremity arteries. Left: Resting left ankle-brachial index indicates noncompressible left lower extremity arteries.  *See table(s) above for measurements and observations.  Electronically signed by Leotis Pain MD on 03/06/2020 at 11:40:38 AM.    Final    ECHOCARDIOGRAM COMPLETE  Result Date: 03/10/2020    ECHOCARDIOGRAM REPORT   Patient Name:   JAVONI LUCKEN Date of Exam: 03/10/2020 Medical Rec #:  [id number]           Height:       69.0 in Accession #:    [id number]          Weight:       175.0 lb Date of Birth:  [birth year]           BSA:          1.952 m Patient Age:    64 years            BP:           81/60 mmHg Patient Gender: M                   HR:           85 bpm. Exam Location:  [facility] Procedure: 2D Echo, Cardiac Doppler and Color Doppler Indications:     Abnormal ECG 794.31  History:         Patient has prior history of Echocardiogram examinations, most                  recent 11/15/2019. Stroke; Risk Factors:Hypertension. PVD.  Sonographer:     Sherrie Sport RDCS (AE) Referring Phys:  [id number] Wyvonnia Dusky Diagnosing Phys: Ida Rogue MD  Sonographer Comments: Image acquisition challenging due to uncooperative patient.  IMPRESSIONS  1. Limited study secondary to patient terminating study prematurely.  2. Left ventricular ejection fraction could not be accurately estimated, grossly 40 to 45%. The left ventricle has mildly decreased function. Unable to exclude anteroseptal hypokinesis.  3. Right ventricle is not well visualized. Right ventricular systolic function is grossly mildly reduced. The right ventricular size is normal.  4. Left atrial size was mildly dilated.  5. The mitral valve is normal in structure. Mild mitral valve regurgitation.  6. The aortic valve was not well visualized. Aortic valve regurgitation is not visualized. FINDINGS  Left Ventricle: Left ventricular ejection fraction, by estimation, is 40 to 45%. The left ventricle has mildly decreased function. The left ventricular internal cavity size was normal in size. There is no left ventricular hypertrophy. Left ventricular diastolic parameters are indeterminate. Right Ventricle: The right ventricular size is normal. No increase in right ventricular wall thickness. Right ventricular systolic function is mildly reduced. Left Atrium: Left atrial size was mildly dilated. Right Atrium: Right atrial size was normal in size. Pericardium: The pericardium was not well visualized. Mitral Valve: The mitral valve is normal in structure. Mild mitral valve regurgitation. Tricuspid Valve: The tricuspid valve is not well visualized. Tricuspid valve regurgitation is not demonstrated. Aortic Valve: The aortic valve was not well visualized. Aortic valve regurgitation is not visualized. Pulmonic Valve: The pulmonic valve was not well visualized. Pulmonic valve regurgitation is not visualized. Aorta: The aortic root was not well visualized and the ascending aorta was not well visualized. IAS/Shunts: The interatrial septum was not assessed. Additional Comments: A pacer wire is visualized.  LEFT VENTRICLE PLAX 2D LVIDd:         4.94 cm LVIDs:  4.65 cm LV PW:         1.06 cm LV  IVS:        1.03 cm LVOT diam:     2.00 cm LVOT Area:     3.14 cm  LEFT ATRIUM         Index LA diam:    4.60 cm 2.36 cm/m   AORTA Ao Root diam: 3.40 cm  SHUNTS Systemic Diam: 2.00 cm Ida Rogue MD Electronically signed by Ida Rogue MD Signature Date/Time: 03/10/2020/4:03:17 PM    Final    US Abdomen Limited RUQ  Result Date: 03/03/2020 CLINICAL DATA:  Elevated liver enzymes. EXAM: ULTRASOUND ABDOMEN LIMITED RIGHT UPPER QUADRANT COMPARISON:  None. FINDINGS: Gallbladder: No gallstones or wall thickening visualized. No sonographic Murphy sign noted by sonographer. Common bile duct: Diameter: 3 mm, within normal limits. Liver: No focal lesion identified. Within normal limits in parenchymal echogenicity. Portal vein is patent on color Doppler imaging with normal direction of blood flow towards the liver. Other: Right pleural effusion incidentally noted. IMPRESSION: No hepatobiliary abnormality identified. Right pleural effusion incidentally noted. Electronically Signed   By: Marlaine Hind M.D.   On: 03/29/2020 19:31     Assessment and plan- Patient is a 69 y.o. male admitted for septic shock secondary to MRSA complicated by acute delirium and acute liver failure.  Hematology consulted for possible DIC  Patient is currently on IV vancomycin for septic shock secondary to MRSA.  He is currently off pressors and back to regular floor.  Patient continues to have acute liver failure as evidenced by elevated AST and ALT of 2756 and 2825 respectively.  Total bilirubin elevated at 2.5.  Albumin low at 2.5.  PT/INR is elevated at 54.5 and 6.4 respectively at baseline patient has an elevated PT PTT INR which was noted even back in June with an INR of 1.6 I suspect his significantly elevated INR is secondary to underlying acute liver failure and is likely to improve only after his liver failure improves.  Recommend continuing parenteral vitamin K to reverse coagulopathy associated with liver and dependent  factors.   Visit Diagnosis 1. Sepsis, due to unspecified organism, unspecified whether acute organ dysfunction present ([place])   2. Altered mental status, unspecified altered mental status type   3. Wound of left foot   4. Transaminitis   5. Postop check   6. Tachypnea      Dr. Randa Evens, MD, MPH [facility] [phone] 03/13/2020 4:05 PM

## 2020-03-13 NOTE — Telephone Encounter (Signed)
I was informed when we attempted to reach out to him yesterday, but thank you.

## 2020-03-13 NOTE — Telephone Encounter (Signed)
It is very fortunate.

## 2020-03-13 NOTE — Consult Note (Signed)
Jason Antigua, MD [address], [place], [place], [place], [address] [address], [place], [place], [place], [address] Phone: [phone]  Fax: [phone]  Consultation  Referring Provider:     No ref. provider found Primary Care Physician:  [facility] Reason for Consultation:     Elevated liver enzymes  Date of Admission:  03/16/2020 Date of Consultation:  03/13/2020         HPI:   Jason Montes is a 69 y.o. male admitted with septic shock secondary to MRSA and GI being consulted for elevated liver enzymes. Patient unable to give history due to altered mental status. No known history of liver disease. No prior history of alcohol abuse documented in his chart. Unable to verify this history with the patient himself.  Patient noted to have acute elevation in transaminases in the last few days. Alkaline phosphatase has been normal. Bilirubin elevated to 2.5 today. Elevated INR but no signs of active bleeding  Patient also in DIC  Past Medical History:  Diagnosis Date  . Allergy   . Atrial fibrillation ([place])   . B12 deficiency   . Chronic kidney disease   . Chronic kidney disease (CKD), stage II (mild)   . Coronary atherosclerosis   . Diabetes mellitus without complication ([place])   . Elevated PSA   . Esophagitis   . GERD (gastroesophageal reflux disease)   . Heart attack ([place])   . Heart disease   . Hyperlipidemia   . Hypertension   . Hypokalemia   . Mild neurocognitive disorder   . Osteoarthritis   . Presbyopia   . PVD (peripheral vascular disease) ([place])   . Restless leg syndrome   . Stroke (cerebrum) ([place])   . Stroke ([place])   . Subacute osteomyelitis ([place])   . Tinea unguium   . Uncompensated short term memory deficit     Past Surgical History:  Procedure Laterality Date  . AMPUTATION Left 03/13/2020   Procedure: LEFT PARTIAL FIRST  RAY AMPUTATION;  Surgeon: Caroline More, DPM;  Location: [facility] ORS;  Service: Podiatry;  Laterality: Left;   . AMPUTATION TOE Left 01/13/2020   Procedure: AMPUTATION RAY LEFT 1ST;  Surgeon: Caroline More, DPM;  Location: [facility] ORS;  Service: Podiatry;  Laterality: Left;  . CARDIAC PACEMAKER PLACEMENT    . COLONOSCOPY WITH PROPOFOL N/A 05/18/2018   Procedure: COLONOSCOPY WITH PROPOFOL;  Surgeon: [name], MD;  Location: [facility] ENDOSCOPY;  Service: Endoscopy;  Laterality: N/A;  . CORONARY ARTERY BYPASS GRAFT    . FOOT AMPUTATION Right   . INTRAMEDULLARY (IM) NAIL INTERTROCHANTERIC Right 11/16/2019   Procedure: INTRAMEDULLARY (IM) NAIL INTERTROCHANTRIC;  Surgeon: Thornton Park, MD;  Location: [facility] ORS;  Service: Orthopedics;  Laterality: Right;  . LOWER EXTREMITY ANGIOGRAPHY Left 10/10/2019   Procedure: Lower Extremity Angiography;  Surgeon: Algernon Huxley, MD;  Location: [place] CV LAB;  Service: Cardiovascular;  Laterality: Left;  . LOWER EXTREMITY ANGIOGRAPHY Left 01/16/2020   Procedure: Lower Extremity Angiography;  Surgeon: Algernon Huxley, MD;  Location: [place] CV LAB;  Service: Cardiovascular;  Laterality: Left;    Prior to Admission medications   Medication Sig Start Date End Date Taking? Authorizing Provider  amiodarone (PACERONE) 400 MG tablet Take 0.5 tablets (200 mg total) by mouth daily. 02/23/20  Yes Angiulli, Lavon Paganini, PA-C  apixaban (ELIQUIS) 5 MG TABS tablet Take 1 tablet (5 mg total) by mouth 2 (two) times daily. 02/23/20  Yes Angiulli, Lavon Paganini, PA-C  ascorbic acid (  VITAMIN C) 500 MG tablet Take 1 tablet (500 mg total) by mouth 2 (two) times daily. 02/23/20  Yes Angiulli, Lavon Paganini, PA-C  aspirin EC 81 MG tablet Take 81 mg by mouth daily.   Yes [provider]  atorvastatin (LIPITOR) 40 MG tablet Take 1 tablet (40 mg total) by mouth at bedtime. 02/23/20  Yes Angiulli, Lavon Paganini, PA-C  Cholecalciferol (VITAMIN D3) 25 MCG (1000 UT) CAPS Take 1 capsule (1,000 Units total) by mouth daily. 02/23/20  Yes Angiulli, Lavon Paganini, PA-C  DULoxetine (CYMBALTA) 20 MG capsule Take 1  capsule (20 mg total) by mouth daily. 02/23/20  Yes Angiulli, Lavon Paganini, PA-C  famotidine (PEPCID) 20 MG tablet Take 1 tablet (20 mg total) by mouth daily. 02/23/20  Yes Angiulli, Lavon Paganini, PA-C  furosemide (LASIX) 20 MG tablet Take 0.5 tablets (10 mg total) by mouth daily. 02/24/20  Yes Angiulli, Lavon Paganini, PA-C  gabapentin (NEURONTIN) 100 MG capsule Take 2 capsules (200 mg total) by mouth 3 (three) times daily. 02/23/20  Yes Angiulli, Lavon Paganini, PA-C  haloperidol (HALDOL) 1 MG tablet Take 1 tablet (1 mg total) by mouth 2 (two) times daily. 02/23/20  Yes Angiulli, Lavon Paganini, PA-C  insulin aspart (NOVOLOG) 100 UNIT/ML FlexPen Inject 11 Units into the skin 3 (three) times daily with meals. 02/23/20  Yes Angiulli, Lavon Paganini, PA-C  insulin glargine (LANTUS) 100 UNIT/ML Solostar Pen Inject 14 Units into the skin daily. 02/23/20  Yes Angiulli, Lavon Paganini, PA-C  Multiple Vitamin (MULTIVITAMIN WITH MINERALS) TABS tablet Take 1 tablet by mouth daily. 02/24/20  Yes Angiulli, Lavon Paganini, PA-C  OLANZapine zydis (ZYPREXA) 15 MG disintegrating tablet Take 1 tablet (15 mg total) by mouth at bedtime. 02/23/20  Yes Angiulli, Lavon Paganini, PA-C  pantoprazole (PROTONIX) 40 MG tablet Take 1 tablet (40 mg total) by mouth daily. 02/27/20  Yes Angiulli, Lavon Paganini, PA-C  polyethylene glycol (MIRALAX / GLYCOLAX) 17 g packet Take 17 g by mouth 2 (two) times daily. 02/03/20  Yes Enzo Bi, MD  senna (SENOKOT) 8.6 MG TABS tablet Take 2 tablets by mouth in the morning and at bedtime.   Yes [provider]  tamsulosin (FLOMAX) 0.4 MG CAPS capsule Take 2 capsules (0.8 mg total) by mouth daily. 02/24/20  Yes Angiulli, Lavon Paganini, PA-C  vitamin B-12 1000 MCG tablet Take 1 tablet (1,000 mcg total) by mouth daily. 02/24/20  Yes Angiulli, Lavon Paganini, PA-C  zinc sulfate 220 (50 Zn) MG capsule Take 1 capsule (220 mg total) by mouth daily. 02/24/20  Yes Angiulli, Lavon Paganini, PA-C  acetaminophen (TYLENOL) 325 MG tablet Take 2 tablets (650 mg total) by mouth every 6  (six) hours as needed for mild pain (or Fever >/= 101). 02/23/20   Angiulli, Lavon Paganini, PA-C  nitroGLYCERIN (NITROSTAT) 0.4 MG SL tablet Place 1 tablet (0.4 mg total) under the tongue every 5 (five) minutes as needed for chest pain. 02/23/20   Angiulli, Lavon Paganini, PA-C    Family History  Problem Relation Age of Onset  . Heart failure Mother   . Colon cancer Mother   . Diabetes Mother   . Thyroid disease Sister      Social History   Tobacco Use  . Smoking status: Never Smoker  . Smokeless tobacco: Never Used  Vaping Use  . Vaping Use: Never used  Substance Use Topics  . Alcohol use: Never  . Drug use: Never    Allergies as of 03/18/2020 - Review Complete 03/16/2020  Allergen Reaction  Noted  . Amlodipine Other (See Comments) 01/11/2020  . Penicillins  02/24/2018  . Norvasc [amlodipine besylate]  02/24/2018    Review of Systems:    All systems reviewed and negative except where noted in HPI.   Physical Exam:  Vital signs in last 24 hours: Vitals:   03/13/20 0725 03/13/20 0810 03/13/20 1145 03/13/20 1637  BP: (!) 133/58 116/60 134/63 117/63  Pulse: 86 88 88 81  Resp: 17 16 18 16   Temp: 97.8 F (36.6 C) 97.7 F (36.5 C) 98.4 F (36.9 C) 97.6 F (36.4 C)  TempSrc: Oral Oral Axillary Oral  SpO2: 99% 99% 100% 98%  Weight:      Height:       Last BM Date: 03/10/20 General:   Somnolent Head:  Normocephalic and atraumatic. Eyes:   No icterus.   Conjunctiva pink. PERRLA. Ears:  Normal auditory acuity. Neck:  Supple; no masses or thyroidomegaly Lungs: Respirations even and unlabored. Lungs clear to auscultation bilaterally.   No wheezes, crackles, or rhonchi.  Abdomen:  Soft, nondistended, nontender. Normal bowel sounds. No appreciable masses or hepatomegaly.  No rebound or guarding.  Neurologic: Altered mental status, unable to cooperate with neurological exam Skin:  Intact without significant lesions or rashes. Cervical Nodes:  No significant cervical adenopathy. Psych:  Altered mental status  LAB RESULTS: Recent Labs    03/14/2020 0444 03/12/20 0338 03/13/20 0459  WBC 22.6* 22.9* 14.6*  HGB 8.2* 9.0* 8.3*  HCT 27.0* 31.3* 28.4*  PLT 129* 75* PLATELET CLUMPS NOTED ON SMEAR, UNABLE TO ESTIMATE   BMET Recent Labs    03/20/2020 0444 03/12/20 0338 03/13/20 0459  NA 135 139 139  K 3.8 3.9 4.2  CL 105 109 111  CO2 20* 17* 13*  GLUCOSE 174* 138* 218*  BUN 36* 49* 59*  CREATININE 1.68* 1.84* 1.87*  CALCIUM 7.6* 7.8* 7.9*   LFT Recent Labs    03/13/20 0459  PROT 7.8  ALBUMIN 2.5*  AST 2,756*  ALT 2,825*  ALKPHOS 113  BILITOT 2.5*   PT/INR Recent Labs    03/12/20 0758 03/13/20 0459  LABPROT 48.9* 54.5*  INR 5.6* 6.4*    STUDIES: DG Chest Port 1 View  Result Date: 03/12/2020 CLINICAL DATA:  Tachypnea. EXAM: PORTABLE CHEST 1 VIEW COMPARISON:  March 09, 2020. FINDINGS: Stable cardiomegaly. Status post coronary bypass graft. Left-sided pacemaker is unchanged in position. No pneumothorax or pleural effusion is noted. Stable minimal to mild bibasilar opacities are noted concerning for scarring or atelectasis. Bony thorax is unremarkable. IMPRESSION: No active disease. Electronically Signed   By: Marijo Conception M.D.   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 Remaining osseous structures appear intact and unchanged in appearance. No additional sites of bony erosion or cortical destruction. Expected postoperative changes within the soft tissues at the resection site. Advanced small vessel vascular calcifications. 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 Electronically Signed   By: Davina Poke D.O.   On: 03/12/2020 08:29      Impression / Plan:   LOKI WUTHRICH is a 69  y.o. y/o male with septic shock secondary to MRSA, DIC, with acute liver failure  Elevated transaminases, and INR are consistent with liver failure from shock liver due to sepsis  Transaminases were at their worst yesterday and have improved today, which would also be consistent with liver failure being from shock liver  Patient does not have any active bleeding therefore does not need FFP's at this time. If active bleeding occurs, patient will need FFP's due to elevated INR  Would recommend correction of elevated INR with vitamin K IV, recheck and repeat dose as necessary  Liver imaging does not show any other etiologies to attribute to his elevated liver enzymes  Avoid hepatotoxic drugs. I see Tylenol listed on his medication list as needed, but he has not received his dose recently.  Acute hepatitis panel neg 2 days ago  Will add other labs, such as autoimmune markers to rule out any other etiologies  Once patient's clinical status improves, oral nutrition becomes important in the setting of liver failure as well. If unable to maintain nutritional needs due to altered mental status, consider [place] tube feeds temporarily  Continue daily CMP  Thank you for involving me in the care of this patient.      LOS: 4 days   Virgel Manifold, MD  03/13/2020, 5:09 PM

## 2020-03-13 NOTE — Telephone Encounter (Signed)
PT called to report missed visit due to patient being in hospital due to an infection that may have gone septic.

## 2020-03-13 NOTE — Progress Notes (Signed)
Pharmacy Antibiotic Note  Jason Montes is a 69 y.o. male admitted on 03/16/2020 with MRSA bacteremia.  Pharmacy was consulted for vancomycin dosing. ID following. Potential source: left great toe ulcer and chronic osteomyelitis. s/p amputation 10/10.   - f/u with TEE (has pacemaker) - 10/8 wound cx MODERATE STAPHYLOCOCCUS AUREUS f/u with susceptabilitities  - 10/8 Bcx 3 out of 4 bottles growing [place]  - 10/8 Ucx no growth - 10/9 Tissue cx FEW STAPHYLOCOCCUS AUREUS f/u with susceptabilitities  - 10/10 Repeat Bcx NGTD.   Plan:  Adjust vancomycin dose to 750 mg IV every 12 hours per [place] nomogram. Day 5 of abx. Will order vancomycin level tonight at 2300. Goal tough is 15-20.   Daily SCr while on vancomycin to assess renal function  Height: 5\' 9"  (175.3 cm) Weight: 86.4 kg (190 lb 8 oz) IBW/kg (Calculated) : 70.7  Temp (24hrs), Avg:97.7 F (36.5 C), Min:97.6 F (36.4 C), Max:97.9 F (36.6 C)  Recent Labs  Lab 03/16/2020 1755 03/25/2020 1834 03/28/2020 2000 03/10/20 0500 03/10/2020 0444 03/12/20 0338 03/13/20 0459  WBC  --  21.7*  --  27.6* 22.6* 22.9* 14.6*  CREATININE 1.86*  --   --  1.62* 1.68* 1.84* 1.87*  LATICACIDVEN 11.0*  --  2.9*  --   --   --   --     Estimated Creatinine Clearance: 41.7 mL/min (A) (by C-G formula based on SCr of 1.87 mg/dL (H)).     Antimicrobials this admission: cefepime 10/8 >> 10/10 metronidazole 10/8 >> 10/10 vancomycin 10/8 >>    Thank you for allowing pharmacy to be a part of this patient's care.  Eleonore Chiquito, PharmD 03/13/2020 8:19 AM

## 2020-03-13 NOTE — Consult Note (Signed)
Brief consult note Patient preop for TEE Patient is very encephalopathic so consent and discussion is very difficult Until I am able to be confident the patient understands the risk and benefits and able to provide adequate consent for the procedure I am going to hold off on TEE. Patient was extremely confused and somnolent when I interviewed him  Thanks Viren Lebeau

## 2020-03-13 NOTE — Progress Notes (Signed)
Speech Language Pathology Treatment: Dysphagia  Patient Details Name: Jason Montes MRN: [id number] DOB: 1951-03-03 Today's Date: 03/13/2020 Time: 9935-7017 SLP Time Calculation (min) (ACUTE ONLY): 35 min  Assessment / Plan / Recommendation Clinical Impression  Pt continues to present w/ significantly declined Cognitive awareness and reduced engagement; he requires Mod+ verbal/tactile/visual cues to participate in tasks. This is turn appears to lend to his presentation of oropharyngeal phase dysphagia and risk for prandial aspiration. He appeared to tolerate trials of Nectar consistency liquids, and purees, fed by TSP. The oral phase c/b decreased bolus awareness intermittently w/ min extended oral phase prior to oral clearing. Suspect delayed pharyngeal swallow initiation d/t the Time post delivery of the bolus, bolus management and transfer, then the pharyngeal swallow. Again, this could be impact of pt's declined Cognitive presentation. Given time and Mod+ tactile/verbal cues(often TSP at lips to cue to take boluses), pt accepted po trials and orally cleared appropriately. Alternating sips and bites was helpful as well. Pt did not attempt to hold Cup to drink this session; eyes remained closed and minimal verbal output was noted(x2 responses only).  Recommend continue a modified consistency diet/po's of Dysphagia level 1 (puree) w/ Nectar consistency liquids; aspiration precautions. Pills in Puree for safer swallowing. Feeding Support and Cues w/ oral intake at all meals; full supervision and cues. NSG updated. ST services will continue to f/u w/ pt's status and appropriateness to upgrade diet next 1-3 days.     HPI HPI: Pt is a 69 y.o. male with medical history significant for HTN, HLD, A. fib on Eliquis, CVA, diabetes with neuropathy, CKD stage IIIa, CAD status post CABG, systolic CHF, PVD status post right transmet and left MTP amputations, with with chronic left foot neuropathic ulcer  with chronic osteomyelitis, followed by podiatry, Dr. Luana Shu and debrided earlier on the day of arrival on 03/22/2020, and with recent prolonged rehab stay from 9/3-9/28 who presents to the emergency room with a several day history of vomiting.  Most of the history taken from his daughter over the phone.  She states patient had been vomiting for the past few days but otherwise was in his usual state of health however EMS reports that patient was confused.  He denies cough, fever, shortness of breath, chest pain, abdominal pain, change in bowel habits but history is unreliable as he mostly answers yes and no and is unable to elaborate further.  Dx: Acute metabolic encephalopathy; followed by Podiatry also.       SLP Plan  Continue with current plan of care       Recommendations  Diet recommendations: Dysphagia 1 (puree);Nectar-thick liquid Liquids provided via: Teaspoon;Cup (monitor) Medication Administration: Crushed with puree (for safer swallowing) Supervision: Full supervision/cueing for compensatory strategies;Staff to assist with self feeding Compensations: Minimize environmental distractions;Slow rate;Small sips/bites;Follow solids with liquid;Multiple dry swallows after each bite/sip Postural Changes and/or Swallow Maneuvers: Seated upright 90 degrees;Upright 30-60 min after meal                General recommendations:  (Dietician f/u) Oral Care Recommendations: Oral care BID;Oral care before and after PO;Staff/trained caregiver to provide oral care Follow up Recommendations: Skilled Nursing facility (TBD) SLP Visit Diagnosis: Dysphagia, oropharyngeal phase (R13.12) (Cognitive decline) Plan: Continue with current plan of care       GO                 Orinda Kenner, [place], [place] Pathologist Rehab Services [phone] [facility] 03/13/2020, 3:06 PM

## 2020-03-13 NOTE — Progress Notes (Signed)
Inpatient Diabetes Program Recommendations  AACE/ADA: New Consensus Statement on Inpatient Glycemic Control (2015)  Target Ranges:  Prepandial:   less than 140 mg/dL      Peak postprandial:   less than 180 mg/dL (1-2 hours)      Critically ill patients:  140 - 180 mg/dL   Lab Results  Component Value Date   GLUCAP 228 (H) 03/13/2020   HGBA1C 7.9 (H) 01/17/2020    Review of Glycemic Control Results for Jason Montes, Jason Montes (MRN [id number]) as of 03/13/2020 10:36  Ref. Range 03/12/2020 07:30 03/12/2020 11:43 03/12/2020 16:02 03/12/2020 19:45 03/12/2020 23:42 03/13/2020 04:23 03/13/2020 09:54  Glucose-Capillary Latest Ref Range: 70 - 99 mg/dL 103 (H) 96 123 (H) 141 (H) 184 (H) 223 (H) 228 (H)   Diabetes history: DM 2 Outpatient Diabetes medications: Lantus 14 units Daily, Novolog 11 units tid Current orders for Inpatient glycemic control:  Novolog 0-9 units Q4 hours  A1c 7.9% on 8/17 Solumedrol 40 mg Q12 hours  Inpatient Diabetes Program Recommendations:    Liver enzymes elevated but are coming down. Glucose trends increased into the low 200 range. overnight into the am. Watch trends for today to see if increase Novolog Correction is needed.  Thanks,  Tama Headings RN, MSN, BC-ADM Inpatient Diabetes Coordinator Team Pager [phone] (8a-5p)

## 2020-03-14 DIAGNOSIS — G9341 Metabolic encephalopathy: Secondary | ICD-10-CM | POA: Diagnosis not present

## 2020-03-14 DIAGNOSIS — K7200 Acute and subacute hepatic failure without coma: Secondary | ICD-10-CM | POA: Diagnosis not present

## 2020-03-14 DIAGNOSIS — Z515 Encounter for palliative care: Secondary | ICD-10-CM

## 2020-03-14 DIAGNOSIS — Z7189 Other specified counseling: Secondary | ICD-10-CM

## 2020-03-14 LAB — APTT: aPTT: 38 seconds — ABNORMAL HIGH (ref 24–36)

## 2020-03-14 LAB — COMPREHENSIVE METABOLIC PANEL
ALT: 2226 U/L — ABNORMAL HIGH (ref 0–44)
AST: 1266 U/L — ABNORMAL HIGH (ref 15–41)
Albumin: 2.4 g/dL — ABNORMAL LOW (ref 3.5–5.0)
Alkaline Phosphatase: 125 U/L (ref 38–126)
Anion gap: 14 (ref 5–15)
BUN: 76 mg/dL — ABNORMAL HIGH (ref 8–23)
CO2: 13 mmol/L — ABNORMAL LOW (ref 22–32)
Calcium: 8 mg/dL — ABNORMAL LOW (ref 8.9–10.3)
Chloride: 115 mmol/L — ABNORMAL HIGH (ref 98–111)
Creatinine, Ser: 1.91 mg/dL — ABNORMAL HIGH (ref 0.61–1.24)
GFR, Estimated: 35 mL/min — ABNORMAL LOW (ref >60)
Glucose, Bld: 264 mg/dL — ABNORMAL HIGH (ref 70–99)
Potassium: 4.2 mmol/L (ref 3.5–5.1)
Sodium: 142 mmol/L (ref 135–145)
Total Bilirubin: 2.7 mg/dL — ABNORMAL HIGH (ref 0.3–1.2)
Total Protein: 7.6 g/dL (ref 6.5–8.1)

## 2020-03-14 LAB — CBC
HCT: 25.9 % — ABNORMAL LOW (ref 39.0–52.0)
Hemoglobin: 8 g/dL — ABNORMAL LOW (ref 13.0–17.0)
MCH: 25.8 pg — ABNORMAL LOW (ref 26.0–34.0)
MCHC: 30.9 g/dL (ref 30.0–36.0)
MCV: 83.5 fL (ref 80.0–100.0)
Platelets: UNDETERMINED 10*3/uL (ref 150–400)
RBC: 3.1 MIL/uL — ABNORMAL LOW (ref 4.22–5.81)
RDW: 18.6 % — ABNORMAL HIGH (ref 11.5–15.5)
WBC: 12 10*3/uL — ABNORMAL HIGH (ref 4.0–10.5)
nRBC: 0.4 % — ABNORMAL HIGH (ref 0.0–0.2)

## 2020-03-14 LAB — FIBRINOGEN: Fibrinogen: 229 mg/dL (ref 210–475)

## 2020-03-14 LAB — GLUCOSE, CAPILLARY
Glucose-Capillary: 229 mg/dL — ABNORMAL HIGH (ref 70–99)
Glucose-Capillary: 230 mg/dL — ABNORMAL HIGH (ref 70–99)
Glucose-Capillary: 235 mg/dL — ABNORMAL HIGH (ref 70–99)

## 2020-03-14 LAB — FIBRIN DERIVATIVES D-DIMER (ARMC ONLY): Fibrin derivatives D-dimer (ARMC): 2747.26 ng/mL (FEU) — ABNORMAL HIGH (ref 0.00–499.00)

## 2020-03-14 LAB — PROTIME-INR
INR: 6.6 (ref 0.8–1.2)
Prothrombin Time: 55.8 seconds — ABNORMAL HIGH (ref 11.4–15.2)

## 2020-03-14 LAB — VANCOMYCIN, TROUGH: Vancomycin Tr: 40 ug/mL (ref 15–20)

## 2020-03-14 LAB — HAPTOGLOBIN: Haptoglobin: 139 mg/dL (ref 32–363)

## 2020-03-14 MED ORDER — MORPHINE SULFATE (PF) 2 MG/ML IV SOLN
2.0000 mg | INTRAVENOUS | Status: DC | PRN
  Administered 2020-03-15 – 2020-03-17 (×6): 2 mg via INTRAVENOUS
  Filled 2020-03-14 (×6): qty 1

## 2020-03-14 MED ORDER — INSULIN GLARGINE 100 UNIT/ML ~~LOC~~ SOLN
10.0000 [IU] | Freq: Every day | SUBCUTANEOUS | Status: DC
  Filled 2020-03-14: qty 0.1

## 2020-03-14 MED ORDER — LORAZEPAM 1 MG PO TABS: 1.0000 mg | ORAL_TABLET | ORAL | Status: DC | PRN

## 2020-03-14 MED ORDER — LORAZEPAM 2 MG/ML PO CONC: 1.0000 mg | ORAL | Status: DC | PRN

## 2020-03-14 MED ORDER — GLYCOPYRROLATE 0.2 MG/ML IJ SOLN
0.2000 mg | INTRAMUSCULAR | Status: DC | PRN
  Filled 2020-03-14: qty 1

## 2020-03-14 MED ORDER — HALOPERIDOL LACTATE 2 MG/ML PO CONC
0.5000 mg | ORAL | Status: DC | PRN
  Filled 2020-03-14: qty 0.3

## 2020-03-14 MED ORDER — GLYCOPYRROLATE 1 MG PO TABS
1.0000 mg | ORAL_TABLET | ORAL | Status: DC | PRN
  Filled 2020-03-14: qty 1

## 2020-03-14 MED ORDER — ACETAMINOPHEN 650 MG RE SUPP: 650.0000 mg | Freq: Four times a day (QID) | RECTAL | Status: DC | PRN

## 2020-03-14 MED ORDER — HALOPERIDOL 0.5 MG PO TABS
0.5000 mg | ORAL_TABLET | ORAL | Status: DC | PRN
  Filled 2020-03-14: qty 1

## 2020-03-14 MED ORDER — MORPHINE SULFATE (PF) 2 MG/ML IV SOLN: 1.0000 mg | INTRAVENOUS | Status: DC | PRN

## 2020-03-14 MED ORDER — ONDANSETRON 4 MG PO TBDP
4.0000 mg | ORAL_TABLET | Freq: Four times a day (QID) | ORAL | Status: DC | PRN
  Filled 2020-03-14: qty 1

## 2020-03-14 MED ORDER — SODIUM CHLORIDE 0.9% FLUSH
3.0000 mL | Freq: Two times a day (BID) | INTRAVENOUS | Status: DC
  Administered 2020-03-14 – 2020-03-17 (×6): 3 mL via INTRAVENOUS

## 2020-03-14 MED ORDER — POLYVINYL ALCOHOL 1.4 % OP SOLN
1.0000 [drp] | Freq: Four times a day (QID) | OPHTHALMIC | Status: DC | PRN
  Filled 2020-03-14: qty 15

## 2020-03-14 MED ORDER — HALOPERIDOL LACTATE 5 MG/ML IJ SOLN: 0.5000 mg | INTRAMUSCULAR | Status: DC | PRN

## 2020-03-14 MED ORDER — ONDANSETRON HCL 4 MG/2ML IJ SOLN: 4.0000 mg | Freq: Four times a day (QID) | INTRAMUSCULAR | Status: DC | PRN

## 2020-03-14 MED ORDER — LORAZEPAM 2 MG/ML IJ SOLN: 1.0000 mg | INTRAMUSCULAR | Status: DC | PRN

## 2020-03-14 MED ORDER — VANCOMYCIN VARIABLE DOSE PER UNSTABLE RENAL FUNCTION (PHARMACIST DOSING): Status: DC

## 2020-03-14 MED ORDER — ACETAMINOPHEN 325 MG PO TABS: 650.0000 mg | ORAL_TABLET | Freq: Four times a day (QID) | ORAL | Status: DC | PRN

## 2020-03-14 MED ORDER — SODIUM CHLORIDE 0.9% FLUSH: 3.0000 mL | INTRAVENOUS | Status: DC | PRN

## 2020-03-14 MED ORDER — BIOTENE DRY MOUTH MT LIQD: 15.0000 mL | OROMUCOSAL | Status: DC | PRN

## 2020-03-14 MED ORDER — VITAMIN K1 10 MG/ML IJ SOLN
10.0000 mg | Freq: Once | INTRAVENOUS | Status: AC
  Administered 2020-03-14: 10 mg via INTRAVENOUS
  Filled 2020-03-14: qty 1

## 2020-03-14 NOTE — Progress Notes (Signed)
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 [facility])   Essential hypertension   History of stroke with current residual effects   Acute renal failure superimposed on stage 3a chronic kidney disease ([place])   Subacute osteomyelitis of left foot (HCC)   Aspiration pneumonia (HCC)   Chronic systolic congestive heart failure (HCC)   Hyperammonemia (HCC)   Diabetic foot ulcer associated with type 2 diabetes mellitus (HCC)   Severe sepsis with acute organ dysfunction (HCC)   Elevated troponin   Chronic anemia   Severe sepsis (HCC)   Abnormal LFTs   Atherosclerosis of native arteries of the extremities with ulceration (HCC)   Critical illness, encephalopathy - discussion with daughter and health care decision maker today, she expressed that patient is DNR and would now want to continue current treatment plan, she thinks he would want to transition to comfort care. Shared decision to continue w/ current mgmt plan for now, obtain formal palliative consult - palliative consult ordered  Septic shock:  with acute organ dysfunction. Presented w/ fever, tachycardia, elevated WBC, elevated lactic acid & end organ dysfunction (AKI, transaminitis, & encephalopathy). Blood cultures growing MRSA, source likely known left foot osteo.  - Continue on IV vanco  Bacteremia: blood cxs growing MRSA. Continue on IV vanco. Repeat blood cxs NGTD. 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 Seen by his podiatrist on 10/8 for necrotic wound left foot with  chronic osteomyelitis.  Likely source of infection. 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 Plan for LLE angiogram w/ possible intervention as per vascular surg, will go when INR permits. Podiatry & vascular surgery recs apprec  Transaminitis: severe & labile, etiology unclear, possibly from shock liver. LFTs slightly downtrending today. [place] abd shows no hepatobiliary abnormality identified. Hepatitis panel neg. GI consulted, appreciate recs. Further labs ordered, results pending  Thrombocytopenia: Likely 2/2 bacteremia. With petechiae, elevated pt/ptt, ldh, fibrin. S/p vitamin k 10 mg IV yesterday, inr stable at 6.6 today, will repeat dose.   - appreciate heme recs  Aspiration pneumonia: continue on IV abxs. Appears resolved.  Acute metabolic encephalopathy: likely secondary to infection & psychotropics. Continue to hold gabapentin, haldol, zyprexa   AKI on CKDIIIa: baseline Cr is 1.3. now stable 1.8 or so - monitor  Hyperammoniemia: etiology unclear, likely secondary to shocked liver . Will continue to monitor  A. fib: w/ RVR. Likely PAF. Continue to hold eliquis for possible DIC. Hold home dose of amiodarone secondary to severe transaminitis/shocked liver.  Continue on tele   Elevated troponin: likely secondary to demand ischemia. troponins stable. Echo shows EF 40-45%, mildly decreased LV which very similar to pt's echo in June 2021. Cardio following   DM2: poorly controlled. Continue on SSI w/ accuchecks, add lantus 10 qhs  HTN: not on any anti-HTN meds as per med rec   Chronic systolic CHF: continue to hold home dose of lasix. Monitor I/Os. Not on BB, ARB/ACE-I    Chronic anemia: no need for a transfusion at this time. Will continue to monitor,  h stable in 8s. Transfuse to keep hgb above 8.    DVT prophylaxis: SCDs Code Status: DNR Family Communication:  Disposition Plan: likely d/c to SNF  Status is: Inpatient  Remains inpatient appropriate because:Ongoing  diagnostic testing needed not appropriate for outpatient work up, IV treatments appropriate due to intensity of illness or inability to take PO and Inpatient level of care appropriate due to severity of illness   Dispo: The patient is from: home               Anticipated d/c is to: SNF              Anticipated d/c date is: > 3 days              Patient currently is not medically stable to d/c.   Consultants:  ID: Dr. Delaine Lame Podiatry: Dr. Luana Shu  Vascular surg: Dr. Trula Slade Hematology: Dr. Janese Banks  GI: Bonna Gains  Cardio: Dr. Neita Carp   Procedures:    Antimicrobials: vanco    Subjective: Asleep, not arousable.  Objective: Vitals:   03/13/20 1637 03/13/20 1936 03/14/20 0400 03/14/20 0823  BP: 117/63 128/68 118/67 118/64  Pulse: 81 89 89 89  Resp: 16 16 (!) 23 20  Temp: 97.6 F (36.4 C) 97.9 F (36.6 C) 98 F (36.7 C) 97.9 F (36.6 C)  TempSrc: Oral  Oral   SpO2: 98% 98% 97% 94%  Weight:   86 kg   Height:        Intake/Output Summary (Last 24 hours) at 03/14/2020 1057 Last data filed at 03/13/2020 1944 Gross per 24 hour  Intake 240 ml  Output 600 ml  Net -360 ml   Filed Weights   03/10/20 1617 03/13/20 0420 03/14/20 0400  Weight: 81.1 kg 86.4 kg 86 kg    Examination:  General exam: Asleep Respiratory system: diminished breath sounds b/l. No rales   Cardiovascular system: S1 &S2+. No rubs or gallops. Soft systolic murmur Gastrointestinal system: Abdomen is nondistended, soft and nontender. Hypoactive bowel sounds heard. Central nervous system: Appears lethargic. Moves all 4 extremities  Extremities: left great toe amputation that is dressed. Right transmetatarsal amputation. Petechiae on LLE  Psychiatry: unable to assess    Data Reviewed: I have personally reviewed following labs and imaging studies  CBC: Recent Labs  Lab 03/10/20 0500 03/09/2020 0444 03/12/20 0338 03/13/20 0459 03/14/20 0518  WBC 27.6* 22.6* 22.9* 14.6* 12.0*  HGB 7.8* 8.2*  9.0* 8.3* 8.0*  HCT 25.6* 27.0* 31.3* 28.4* 25.9*  MCV 84.2 84.4 86.2 87.1 83.5  PLT 186 129* 75* PLATELET CLUMPS NOTED ON SMEAR, UNABLE TO ESTIMATE PLATELET CLUMPS NOTED ON SMEAR, UNABLE TO ESTIMATE   Basic Metabolic Panel: Recent Labs  Lab 03/10/20 0500 03/18/2020 0444 03/12/20 0338 03/13/20 0459 03/14/20 0518  NA 133* 135 139 139 142  K 3.7 3.8 3.9 4.2 4.2  CL 104 105 109 111 115*  CO2 17* 20* 17* 13* 13*  GLUCOSE 172* 174* 138* 218* 264*  BUN 25* 36* 49* 59* 76*  CREATININE 1.62* 1.68* 1.84* 1.87* 1.91*  CALCIUM 7.8* 7.6* 7.8* 7.9* 8.0*   GFR: Estimated Creatinine Clearance: 40.8 mL/min (A) (by C-G formula based on SCr of 1.91 mg/dL (H)). Liver Function Tests: Recent Labs  Lab 03/10/20 0500 03/03/2020 0444 03/12/20 0338 03/13/20 0459 03/14/20 0518  AST 228* 2,306* 4,704* 2,756* 1,266*  ALT 153* 1,641* 3,337* 2,825* 2,226*  ALKPHOS 90 92 104 113 125  BILITOT 1.4* 1.7* 2.0* 2.5* 2.7*  PROT 7.9 8.2*  8.1 7.8 7.6  ALBUMIN 2.5* 2.6* 2.5* 2.5* 2.4*   No results for input(s): LIPASE, AMYLASE in the last 168 hours. Recent Labs  Lab 03/21/2020 1755 03/13/20 1836  AMMONIA 50* 34   Coagulation Profile: Recent Labs  Lab 03/29/2020 1755 03/10/20 0500 03/12/20 0758 03/13/20 0459 03/14/20 0518  INR 1.8* 2.7* 5.6* 6.4* 6.6*   Cardiac Enzymes: No results for input(s): CKTOTAL, CKMB, CKMBINDEX, TROPONINI in the last 168 hours. BNP (last 3 results) No results for input(s): PROBNP in the last 8760 hours. HbA1C: No results for input(s): HGBA1C in the last 72 hours. CBG: Recent Labs  Lab 03/13/20 1638 03/13/20 2043 03/13/20 2358 03/14/20 0428 03/14/20 0822  GLUCAP 220* 223* 233* 229* 230*   Lipid Profile: No results for input(s): CHOL, HDL, LDLCALC, TRIG, CHOLHDL, LDLDIRECT in the last 72 hours. Thyroid Function Tests: No results for input(s): TSH, T4TOTAL, FREET4, T3FREE, THYROIDAB in the last 72 hours. Anemia Panel: Recent Labs    03/13/20 0459  VITAMINB12  >7,500*  FOLATE 35.0  FERRITIN 143  TIBC 312  IRON 55  RETICCTPCT 2.3   Sepsis Labs: Recent Labs  Lab 03/24/2020 1755 03/28/2020 2000 03/19/2020 0444 03/12/20 0338  PROCALCITON <0.10  --  28.61 21.04  LATICACIDVEN 11.0* 2.9*  --   --     Recent Results (from the past 240 hour(s))  Aerobic/Anaerobic Culture (surgical/deep wound)     Status: None (Preliminary result)   Collection Time: 03/06/2020 12:36 PM   Specimen: Wound  Result Value Ref Range Status   Specimen Description   Final    WOUND Performed at [facility], [address], [place], [place] [address]    Special Requests   Final    NONE Performed at [facility], [address] [place], [place] [address]    Gram Stain   Final    FEW WBC PRESENT, PREDOMINANTLY MONONUCLEAR FEW GRAM POSITIVE COCCI IN PAIRS IN CLUSTERS Performed at [facility], [place] [address], [place], [place] [address]    Culture   Final    MODERATE METHICILLIN RESISTANT STAPHYLOCOCCUS AUREUS NO ANAEROBES ISOLATED; CULTURE IN PROGRESS FOR 5 DAYS    Report Status PENDING  Incomplete   Organism ID, Bacteria METHICILLIN RESISTANT STAPHYLOCOCCUS AUREUS  Final      Susceptibility   Methicillin resistant staphylococcus aureus - MIC*    CIPROFLOXACIN >=8 RESISTANT Resistant     ERYTHROMYCIN >=8 RESISTANT Resistant     GENTAMICIN <=0.5 SENSITIVE Sensitive     OXACILLIN >=4 RESISTANT Resistant     TETRACYCLINE <=1 SENSITIVE Sensitive     VANCOMYCIN <=0.5 SENSITIVE Sensitive     TRIMETH/SULFA <=10 SENSITIVE Sensitive     CLINDAMYCIN >=8 RESISTANT Resistant     RIFAMPIN <=0.5 SENSITIVE Sensitive     Inducible Clindamycin NEGATIVE Sensitive     * MODERATE METHICILLIN RESISTANT STAPHYLOCOCCUS AUREUS  Blood Culture (routine x 2)     Status: Abnormal   Collection Time: 03/18/2020  5:55 PM   Specimen: BLOOD  Result Value Ref Range Status   Specimen Description   Final    BLOOD RIGHT ANTECUBITAL Performed at [facility],  [address] [place], [place] [address]    Special Requests   Final    BOTTLES DRAWN AEROBIC AND ANAEROBIC Blood Culture results may not be optimal due to an excessive volume of blood received in culture bottles Performed at [facility], [address] [place], [place] [address]    Culture  Setup  Time   Final    GRAM POSITIVE COCCI IN BOTH AEROBIC AND ANAEROBIC BOTTLES    Culture (A)  Final    STAPHYLOCOCCUS AUREUS SUSCEPTIBILITIES PERFORMED ON PREVIOUS CULTURE WITHIN THE LAST 5 DAYS. Performed at [facility], [place] [address], [place], [place] [address]    Report Status 03/12/2020 FINAL  Final  Blood Culture (routine x 2)     Status: Abnormal   Collection Time: 03/03/2020  5:55 PM   Specimen: BLOOD  Result Value Ref Range Status   Specimen Description   Final    BLOOD LEFT ANTECUBITAL Performed at [facility], [address] [place], [place] [address]    Special Requests   Final    BOTTLES DRAWN AEROBIC AND ANAEROBIC Blood Culture results may not be optimal due to an excessive volume of blood received in culture bottles Performed at [facility], [address] [place], [place] [address]    Culture  Setup Time   Final    ANAEROBIC BOTTLE ONLY GRAM POSITIVE COCCI AEROBIC BOTTLE ONLY CRITICAL RESULT CALLED TO, READ BACK BY AND VERIFIED WITH: ALEX CHAPPELL AT 1017 0/9/21 SDR Performed at [facility], [place] [address] [place], [place] [address]    Culture METHICILLIN RESISTANT STAPHYLOCOCCUS AUREUS (A)  Final   Report Status 03/12/2020 FINAL  Final   Organism ID, Bacteria METHICILLIN RESISTANT STAPHYLOCOCCUS AUREUS  Final      Susceptibility   Methicillin resistant staphylococcus aureus - MIC*    CIPROFLOXACIN >=8 RESISTANT Resistant     ERYTHROMYCIN >=8 RESISTANT Resistant     GENTAMICIN <=0.5 SENSITIVE Sensitive     OXACILLIN >=4 RESISTANT Resistant     TETRACYCLINE <=1 SENSITIVE Sensitive     VANCOMYCIN <=0.5 SENSITIVE  Sensitive     TRIMETH/SULFA <=10 SENSITIVE Sensitive     CLINDAMYCIN >=8 RESISTANT Resistant     RIFAMPIN <=0.5 SENSITIVE Sensitive     Inducible Clindamycin NEGATIVE Sensitive     * METHICILLIN RESISTANT STAPHYLOCOCCUS AUREUS  Blood Culture ID Panel (Reflexed)     Status: Abnormal   Collection Time: 03/31/2020  5:55 PM  Result Value Ref Range Status   Enterococcus faecalis NOT DETECTED NOT DETECTED Final   Enterococcus Faecium NOT DETECTED NOT DETECTED Final   Listeria monocytogenes NOT DETECTED NOT DETECTED Final   Staphylococcus species DETECTED (A) NOT DETECTED Final    Comment: CRITICAL RESULT CALLED TO, READ BACK BY AND VERIFIED WITH:  ALEX CHAPPELL AT 1017 03/10/20 SDR    Staphylococcus aureus (BCID) DETECTED (A) NOT DETECTED Final    Comment: Methicillin (oxacillin)-resistant Staphylococcus aureus (MRSA). MRSA is predictably resistant to beta-lactam antibiotics (except ceftaroline). Preferred therapy is vancomycin unless clinically contraindicated. Patient requires contact precautions if  hospitalized. CRITICAL RESULT CALLED TO, READ BACK BY AND VERIFIED WITH:  ALEX CHAPPELL AT 1017 03/10/20 SDR    Staphylococcus epidermidis NOT DETECTED NOT DETECTED Final   Staphylococcus lugdunensis NOT DETECTED NOT DETECTED Final   Streptococcus species NOT DETECTED NOT DETECTED Final   Streptococcus agalactiae NOT DETECTED NOT DETECTED Final   Streptococcus pneumoniae NOT DETECTED NOT DETECTED Final   Streptococcus pyogenes NOT DETECTED NOT DETECTED Final   A.calcoaceticus-baumannii NOT DETECTED NOT DETECTED Final   Bacteroides fragilis NOT DETECTED NOT DETECTED Final   Enterobacterales NOT DETECTED NOT DETECTED Final   Enterobacter cloacae complex NOT DETECTED NOT DETECTED Final   Escherichia coli NOT DETECTED NOT DETECTED Final   Klebsiella aerogenes NOT DETECTED NOT DETECTED Final   Klebsiella oxytoca  NOT DETECTED NOT DETECTED Final   Klebsiella pneumoniae NOT DETECTED NOT DETECTED Final    Proteus species NOT DETECTED NOT DETECTED Final   Salmonella species NOT DETECTED NOT DETECTED Final   Serratia marcescens NOT DETECTED NOT DETECTED Final   Haemophilus influenzae NOT DETECTED NOT DETECTED Final   Neisseria meningitidis NOT DETECTED NOT DETECTED Final   Pseudomonas aeruginosa NOT DETECTED NOT DETECTED Final   Stenotrophomonas maltophilia NOT DETECTED NOT DETECTED Final   Candida albicans NOT DETECTED NOT DETECTED Final   Candida auris NOT DETECTED NOT DETECTED Final   Candida glabrata NOT DETECTED NOT DETECTED Final   Candida krusei NOT DETECTED NOT DETECTED Final   Candida parapsilosis NOT DETECTED NOT DETECTED Final   Candida tropicalis NOT DETECTED NOT DETECTED Final   Cryptococcus neoformans/gattii NOT DETECTED NOT DETECTED Final   Meth resistant mecA/C and MREJ DETECTED (A) NOT DETECTED Final    Comment: CRITICAL RESULT CALLED TO, READ BACK BY AND VERIFIED WITH:  ALEX CHAPPELL AT 1017 03/10/20 SDR Performed at [facility], [address] [place], [place] [address]   Respiratory Panel by RT PCR (Flu A&B, Covid) - Nasopharyngeal Swab     Status: None   Collection Time: 03/25/2020  6:21 PM   Specimen: Nasopharyngeal Swab  Result Value Ref Range Status   SARS Coronavirus 2 by RT PCR NEGATIVE NEGATIVE Final    Comment: (NOTE) SARS-CoV-2 target nucleic acids are NOT DETECTED.  The SARS-CoV-2 RNA is generally detectable in upper respiratoy specimens during the acute phase of infection. The lowest concentration of SARS-CoV-2 viral copies this assay can detect is 131 copies/mL. A negative result does not preclude SARS-Cov-2 infection and should not be used as the sole basis for treatment or other patient management decisions. A negative result may occur with  improper specimen collection/handling, submission of specimen other than nasopharyngeal swab, presence of viral mutation(s) within the areas targeted by this assay, and inadequate number of viral  copies (<131 copies/mL). A negative result must be combined with clinical observations, patient history, and epidemiological information. The expected result is Negative.  Fact Sheet for Patients:  PinkCheek.be  Fact Sheet for Healthcare Providers:  GravelBags.it  This test is no t yet approved or cleared by the [place] FDA and  has been authorized for detection and/or diagnosis of SARS-CoV-2 by FDA under an Emergency Use Authorization (EUA). This EUA will remain  in effect (meaning this test can be used) for the duration of the COVID-19 declaration under Section 564(b)(1) of the Act, 21 U.S.C. section 360bbb-3(b)(1), unless the authorization is terminated or revoked sooner.     Influenza A by PCR NEGATIVE NEGATIVE Final   Influenza B by PCR NEGATIVE NEGATIVE Final    Comment: (NOTE) The Xpert Xpress SARS-CoV-2/FLU/RSV assay is intended as an aid in  the diagnosis of influenza from Nasopharyngeal swab specimens and  should not be used as a sole basis for treatment. Nasal washings and  aspirates are unacceptable for Xpert Xpress SARS-CoV-2/FLU/RSV  testing.  Fact Sheet for Patients: PinkCheek.be  Fact Sheet for Healthcare Providers: GravelBags.it  This test is not yet approved or cleared by the [place] FDA and  has been authorized for detection and/or diagnosis of SARS-CoV-2 by  FDA under an Emergency Use Authorization (EUA). This EUA will remain  in effect (meaning this test can be used) for the duration of the  Covid-19 declaration under Section 564(b)(1) of the Act, 21  U.S.C. section 360bbb-3(b)(1), unless the authorization is  terminated  or revoked. Performed at [facility], [address], [place], [place] [address]   Urine culture     Status: None   Collection Time: 03/22/2020  9:06 PM   Specimen: In/Out Cath Urine  Result Value  Ref Range Status   Specimen Description   Final    IN/OUT CATH URINE Performed at [facility], [address], [place], [place] [address]    Special Requests   Final    NONE Performed at [facility], [address], [place], [place] [address]    Culture   Final    NO GROWTH Performed at [facility], [place] [address], [place], [place] [address]    Report Status 03/09/2020 FINAL  Final  Aerobic/Anaerobic Culture (surgical/deep wound)     Status: None (Preliminary result)   Collection Time: 03/10/20 10:55 AM   Specimen: Tissue; Wound  Result Value Ref Range Status   Specimen Description   Final    TISSUE Performed at [facility], [address] [place], [place] [address]    Special Requests   Final    LEFT PARTIAL FIRST RAY AMPUTATION Performed at [facility], [place], [place] [address]    Gram Stain   Final    RARE WBC PRESENT, PREDOMINANTLY PMN RARE GRAM POSITIVE COCCI Performed at [facility], [place] [address], [place], [place] [address]    Culture   Final    FEW METHICILLIN RESISTANT STAPHYLOCOCCUS AUREUS NO ANAEROBES ISOLATED; CULTURE IN PROGRESS FOR 5 DAYS    Report Status PENDING  Incomplete   Organism ID, Bacteria METHICILLIN RESISTANT STAPHYLOCOCCUS AUREUS  Final      Susceptibility   Methicillin resistant staphylococcus aureus - MIC*    CIPROFLOXACIN >=8 RESISTANT Resistant     ERYTHROMYCIN >=8 RESISTANT Resistant     GENTAMICIN <=0.5 SENSITIVE Sensitive     OXACILLIN >=4 RESISTANT Resistant     TETRACYCLINE <=1 SENSITIVE Sensitive     VANCOMYCIN <=0.5 SENSITIVE Sensitive     TRIMETH/SULFA <=10 SENSITIVE Sensitive     CLINDAMYCIN >=8 RESISTANT Resistant     RIFAMPIN <=0.5 SENSITIVE Sensitive     Inducible Clindamycin NEGATIVE Sensitive     * FEW METHICILLIN RESISTANT STAPHYLOCOCCUS AUREUS  MRSA PCR Screening     Status: Abnormal   Collection Time: 03/10/20  4:21 PM   Specimen:  Nasopharyngeal  Result Value Ref Range Status   MRSA by PCR POSITIVE (A) NEGATIVE Final    Comment:        The GeneXpert MRSA Assay (FDA approved for NASAL specimens only), is one component of a comprehensive MRSA colonization surveillance program. It is not intended to diagnose MRSA infection nor to guide or monitor treatment for MRSA infections. RESULT CALLED TO, READ BACK BY AND VERIFIED WITH:  [facility] HARRIS AT 1729 03/10/20 SDR Performed at [facility], [address] [place], [place] [address]   CULTURE, BLOOD (ROUTINE X 2) w Reflex to ID Panel     Status: None (Preliminary result)   Collection Time: 03/28/2020  2:50 PM   Specimen: BLOOD  Result Value Ref Range Status   Specimen Description BLOOD BLOOD RIGHT HAND  Final   Special Requests   Final    BOTTLES DRAWN AEROBIC AND ANAEROBIC Blood Culture adequate volume   Culture   Final    NO GROWTH 3 DAYS Performed at [facility], [address] [place], [place] [address]    Report Status  PENDING  Incomplete  CULTURE, BLOOD (ROUTINE X 2) w Reflex to ID Panel     Status: None (Preliminary result)   Collection Time: 03/09/2020  2:59 PM   Specimen: BLOOD  Result Value Ref Range Status   Specimen Description BLOOD BLOOD RIGHT HAND  Final   Special Requests   Final    BOTTLES DRAWN AEROBIC AND ANAEROBIC Blood Culture adequate volume   Culture   Final    NO GROWTH 3 DAYS Performed at [facility], [address], [place], [place] [address]    Report Status PENDING  Incomplete         Radiology Studies: No results found.      Scheduled Meds: . aspirin EC  81 mg Oral Daily  . Chlorhexidine Gluconate Cloth  6 each Topical Daily  . cholecalciferol  1,000 Units Oral Daily  . DULoxetine  20 mg Oral Daily  . famotidine  20 mg Oral Daily  . insulin aspart  0-9 Units Subcutaneous Q4H  . methylPREDNISolone (SOLU-MEDROL) injection  40 mg Intravenous Q12H  . mupirocin ointment  1 application  Nasal BID  . pantoprazole (PROTONIX) IV  40 mg Intravenous Q24H  . vancomycin variable dose per unstable renal function (pharmacist dosing)   Does not apply See admin instructions   Continuous Infusions: . sodium chloride 100 mL/hr at 03/13/20 1707  . sodium chloride 250 mL (03/14/20 0043)  . phytonadione (VITAMIN K) IV       LOS: 5 days    Time spent: 30 mins     Desma Maxim, MD [facility]  If 7PM-7AM, please contact night-coverage [email or web address] 03/14/2020, 10:57 AM

## 2020-03-14 NOTE — Progress Notes (Signed)
Pharmacy Antibiotic Note  Jason Montes is a 69 y.o. male admitted on 03/05/2020 with MRSA bacteremia.  Pharmacy was consulted for vancomycin dosing. ID following. Potential source: left great toe ulcer and chronic osteomyelitis. s/p amputation 10/10.   - f/u with TEE (has pacemaker) - 10/8 wound cx MODERATE STAPHYLOCOCCUS AUREUS f/u with susceptabilitities  - 10/8 Bcx 3 out of 4 bottles growing [facility]  - 10/8 Ucx no growth - 10/9 Tissue cx FEW STAPHYLOCOCCUS AUREUS f/u with susceptabilitities  - 10/10 Repeat Bcx NGTD.   Plan:  Adjust vancomycin dose to 750 mg IV every 12 hours per [facility] nomogram. Day 6 of abx. Trough returned at 40 (~ 12 hours post dose) Goal tough is 15-20. Will hold vancomycin and recheck level in 24 hours. Scr continued to trend up. May need to switch to linezolid.   Daily SCr while on vancomycin to assess renal function  Height: 5\' 9"  (175.3 cm) Weight: 86 kg (189 lb 9.6 oz) IBW/kg (Calculated) : 70.7  Temp (24hrs), Avg:97.9 F (36.6 C), Min:97.6 F (36.4 C), Max:98.4 F (36.9 C)  Recent Labs  Lab 03/28/2020 1755 03/18/2020 1834 03/20/2020 2000 03/10/20 0500 03/07/2020 0444 03/12/20 0338 03/13/20 0459 03/13/20 2314 03/14/20 0518  WBC  --    < >  --  27.6* 22.6* 22.9* 14.6*  --  12.0*  CREATININE 1.86*   < >  --  1.62* 1.68* 1.84* 1.87*  --  1.91*  LATICACIDVEN 11.0*  --  2.9*  --   --   --   --   --   --   VANCOTROUGH  --   --   --   --   --   --   --  40*  --    < > = values in this interval not displayed.    Estimated Creatinine Clearance: 40.8 mL/min (A) (by C-G formula based on SCr of 1.91 mg/dL (H)).     Antimicrobials this admission: cefepime 10/8 >> 10/10 metronidazole 10/8 >> 10/10 vancomycin 10/8 >>    Thank you for allowing pharmacy to be a part of this patient's care.  Eleonore Chiquito, PharmD, BCPS 03/14/2020 7:39 AM

## 2020-03-14 NOTE — Progress Notes (Signed)
Pt had a critical INR of 6.6. MD notified.

## 2020-03-14 NOTE — Consult Note (Signed)
Consultation Note Date: 03/14/2020   Patient Name: Jason Montes  DOB: 06/29/1950  MRN: [id number]  Age / Sex: 69 y.o., male  PCP: [facility] Referring Physician: Gwynne Edinger, MD  Reason for Consultation: Establishing goals of care  HPI/Patient Profile: SEYON STRADER is a 69 y.o. male with a past medical history of CKD, diabetes, gastric reflux, hypertension, hyperlipidemia, CVA, presents to the emergency department for altered mental status.  EMS brought the patient from home for reported confusion and altered mental status.  Clinical Assessment and Patient is resting in bed with daughter at bedside. We discussed his diagnoses, prognosis, GOC, EOL wishes disposition and options. She is a paramedic and has a good grasp of his issues.   A detailed discussion was had today regarding advanced directives.  Concepts specific to code status, artifical feeding and hydration, IV antibiotics and rehospitalization were discussed.  The difference between an aggressive medical intervention path and a comfort care path was discussed.  Values and goals of care important to patient and family were attempted to be elicited.  Discussed limitations of medical interventions to prolong quality of life in some situations and discussed the concept of human mortality.    She discusses his psychiological/behavioral history. She states he was released from prison 2 years ago and did well until his hip fracture 6 months ago. She states this was a sentinel event as he began to decline. She states "he stopped trying" .   She would like to focus on comfort and shift to comfort care until his death.    I completed a MOST form today and the signed original was placed in the chart. A photocopy was placed in the chart to be scanned into EMR. The patient outlined their wishes for the following treatment  decisions:  Cardiopulmonary Resuscitation: Do Not Attempt Resuscitation (DNR/No CPR)  Medical Interventions: Comfort Measures: Keep clean, warm, and dry. Use medication by any route, positioning, wound care, and other measures to relieve pain and suffering. Use oxygen, suction and manual treatment of airway obstruction as needed for comfort. Do not transfer to the hospital unless comfort needs cannot be met in current location.  Antibiotics: No antibiotics (use other measures to relieve symptoms)  IV Fluids: No IV fluids (provide other measures to ensure comfort)  Feeding Tube: No feeding tube      SUMMARY OF RECOMMENDATIONS    Comfort care with hospice facility placement.   Staff working on deactivating his ICD.   Prognosis:   < 2 weeks  Discharge Planning: Hospice facility      Primary Diagnoses: Present on Admission: . Acute metabolic encephalopathy . DM (diabetes mellitus), type 2, uncontrolled with complications ([place]) . Acute renal failure superimposed on stage 3a chronic kidney disease ([place]) . Subacute osteomyelitis of left foot ([place]) . 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 CAD (coronary artery disease) . Atrial fibrillation ([place]) . Essential hypertension . Severe sepsis ([place]) . Chronic systolic congestive heart failure ([place])   I have reviewed the medical record, interviewed  the patient and family, and examined the patient. The following aspects are pertinent.  Past Medical History:  Diagnosis Date  . Allergy   . Atrial fibrillation ([place])   . B12 deficiency   . Chronic kidney disease   . Chronic kidney disease (CKD), stage II (mild)   . Coronary atherosclerosis   . Diabetes mellitus without complication ([place])   . Elevated PSA   . Esophagitis   . GERD (gastroesophageal reflux disease)   . Heart attack ([place])   . Heart disease   . Hyperlipidemia   . Hypertension   . Hypokalemia   . Mild neurocognitive disorder   . Osteoarthritis   . Presbyopia   . PVD  (peripheral vascular disease) ([place])   . Restless leg syndrome   . Stroke (cerebrum) ([place])   . Stroke ([place])   . Subacute osteomyelitis ([place])   . Tinea unguium   . Uncompensated short term memory deficit    Social History   Socioeconomic History  . Marital status: Single    Spouse name: Not on file  . Number of children: Not on file  . Years of education: Not on file  . Highest education level: Not on file  Occupational History  . Not on file  Tobacco Use  . Smoking status: Never Smoker  . Smokeless tobacco: Never Used  Vaping Use  . Vaping Use: Never used  Substance and Sexual Activity  . Alcohol use: Never  . Drug use: Never  . Sexual activity: Not on file  Other Topics Concern  . Not on file  Social History Narrative  . Not on file   Social Determinants of Health   Financial Resource Strain:   . Difficulty of Paying Living Expenses: Not on file  Food Insecurity:   . Worried About Charity fundraiser in the Last Year: Not on file  . Ran Out of Food in the Last Year: Not on file  Transportation Needs:   . Lack of Transportation (Medical): Not on file  . Lack of Transportation (Non-Medical): Not on file  Physical Activity:   . Days of Exercise per Week: Not on file  . Minutes of Exercise per Session: Not on file  Stress:   . Feeling of Stress : Not on file  Social Connections:   . Frequency of Communication with Friends and Family: Not on file  . Frequency of Social Gatherings with Friends and Family: Not on file  . Attends Religious Services: Not on file  . Active Member of Clubs or Organizations: Not on file  . Attends Archivist Meetings: Not on file  . Marital Status: Not on file   Family History  Problem Relation Age of Onset  . Heart failure Mother   . Colon cancer Mother   . Diabetes Mother   . Thyroid disease Sister    Scheduled Meds: . aspirin EC  81 mg Oral Daily  . Chlorhexidine Gluconate Cloth  6 each Topical Daily  .  cholecalciferol  1,000 Units Oral Daily  . DULoxetine  20 mg Oral Daily  . famotidine  20 mg Oral Daily  . insulin aspart  0-9 Units Subcutaneous Q4H  . insulin glargine  10 Units Subcutaneous QHS  . methylPREDNISolone (SOLU-MEDROL) injection  40 mg Intravenous Q12H  . mupirocin ointment  1 application Nasal BID  . pantoprazole (PROTONIX) IV  40 mg Intravenous Q24H  . vancomycin variable dose per unstable renal function (pharmacist dosing)   Does not apply See  admin instructions   Continuous Infusions: . sodium chloride 100 mL/hr at 03/14/20 1133  . sodium chloride 250 mL (03/14/20 0043)   PRN Meds:.LORazepam, ondansetron **OR** ondansetron (ZOFRAN) IV Medications Prior to Admission:  Prior to Admission medications   Medication Sig Start Date End Date Taking? Authorizing Provider  amiodarone (PACERONE) 400 MG tablet Take 0.5 tablets (200 mg total) by mouth daily. 02/23/20  Yes Angiulli, Lavon Paganini, PA-C  apixaban (ELIQUIS) 5 MG TABS tablet Take 1 tablet (5 mg total) by mouth 2 (two) times daily. 02/23/20  Yes Angiulli, Lavon Paganini, PA-C  ascorbic acid (VITAMIN C) 500 MG tablet Take 1 tablet (500 mg total) by mouth 2 (two) times daily. 02/23/20  Yes Angiulli, Lavon Paganini, PA-C  aspirin EC 81 MG tablet Take 81 mg by mouth daily.   Yes [provider]  atorvastatin (LIPITOR) 40 MG tablet Take 1 tablet (40 mg total) by mouth at bedtime. 02/23/20  Yes Angiulli, Lavon Paganini, PA-C  Cholecalciferol (VITAMIN D3) 25 MCG (1000 UT) CAPS Take 1 capsule (1,000 Units total) by mouth daily. 02/23/20  Yes Angiulli, Lavon Paganini, PA-C  DULoxetine (CYMBALTA) 20 MG capsule Take 1 capsule (20 mg total) by mouth daily. 02/23/20  Yes Angiulli, Lavon Paganini, PA-C  famotidine (PEPCID) 20 MG tablet Take 1 tablet (20 mg total) by mouth daily. 02/23/20  Yes Angiulli, Lavon Paganini, PA-C  furosemide (LASIX) 20 MG tablet Take 0.5 tablets (10 mg total) by mouth daily. 02/24/20  Yes Angiulli, Lavon Paganini, PA-C  gabapentin (NEURONTIN) 100 MG  capsule Take 2 capsules (200 mg total) by mouth 3 (three) times daily. 02/23/20  Yes Angiulli, Lavon Paganini, PA-C  haloperidol (HALDOL) 1 MG tablet Take 1 tablet (1 mg total) by mouth 2 (two) times daily. 02/23/20  Yes Angiulli, Lavon Paganini, PA-C  insulin aspart (NOVOLOG) 100 UNIT/ML FlexPen Inject 11 Units into the skin 3 (three) times daily with meals. 02/23/20  Yes Angiulli, Lavon Paganini, PA-C  insulin glargine (LANTUS) 100 UNIT/ML Solostar Pen Inject 14 Units into the skin daily. 02/23/20  Yes Angiulli, Lavon Paganini, PA-C  Multiple Vitamin (MULTIVITAMIN WITH MINERALS) TABS tablet Take 1 tablet by mouth daily. 02/24/20  Yes Angiulli, Lavon Paganini, PA-C  OLANZapine zydis (ZYPREXA) 15 MG disintegrating tablet Take 1 tablet (15 mg total) by mouth at bedtime. 02/23/20  Yes Angiulli, Lavon Paganini, PA-C  pantoprazole (PROTONIX) 40 MG tablet Take 1 tablet (40 mg total) by mouth daily. 02/27/20  Yes Angiulli, Lavon Paganini, PA-C  polyethylene glycol (MIRALAX / GLYCOLAX) 17 g packet Take 17 g by mouth 2 (two) times daily. 02/03/20  Yes Enzo Bi, MD  senna (SENOKOT) 8.6 MG TABS tablet Take 2 tablets by mouth in the morning and at bedtime.   Yes [provider]  tamsulosin (FLOMAX) 0.4 MG CAPS capsule Take 2 capsules (0.8 mg total) by mouth daily. 02/24/20  Yes Angiulli, Lavon Paganini, PA-C  vitamin B-12 1000 MCG tablet Take 1 tablet (1,000 mcg total) by mouth daily. 02/24/20  Yes Angiulli, Lavon Paganini, PA-C  zinc sulfate 220 (50 Zn) MG capsule Take 1 capsule (220 mg total) by mouth daily. 02/24/20  Yes Angiulli, Lavon Paganini, PA-C  acetaminophen (TYLENOL) 325 MG tablet Take 2 tablets (650 mg total) by mouth every 6 (six) hours as needed for mild pain (or Fever >/= 101). 02/23/20   Angiulli, Lavon Paganini, PA-C  nitroGLYCERIN (NITROSTAT) 0.4 MG SL tablet Place 1 tablet (0.4 mg total) under the tongue every 5 (five) minutes as needed for chest  pain. 02/23/20   Angiulli, Lavon Paganini, PA-C   Allergies  Allergen Reactions  . Amlodipine Other (See Comments)  .  Penicillins     Childhood allergy, not sure what happens  Other reaction(s): Other, Other  . Norvasc [Amlodipine Besylate]     Unknown   Review of Systems  Unable to perform ROS   Physical Exam Constitutional:      Comments: Eyes closed.      Vital Signs: BP 130/63   Pulse 70   Temp 97.8 F (36.6 C) (Oral)   Resp 20   Ht $R'5\' 9"'fI$  (1.753 m)   Wt 86 kg   SpO2 98%   BMI 28.00 kg/m  Pain Scale: Faces POSS *See Group Information*: S-Acceptable,Sleep, easy to arouse Pain Score: 0-No pain   SpO2: SpO2: 98 % O2 Device:SpO2: 98 % O2 Flow Rate: .O2 Flow Rate (L/min): 2 L/min  IO: Intake/output summary:   Intake/Output Summary (Last 24 hours) at 03/14/2020 1522 Last data filed at 03/14/2020 1259 Gross per 24 hour  Intake 120 ml  Output 250 ml  Net -130 ml    LBM: Last BM Date: 03/10/20 Baseline Weight: Weight: 79.4 kg Most recent weight: Weight: 86 kg     Palliative Assessment/Data:     Time In: 2:35 Time Out: 3:45 Time Total: 70 min Greater than 50%  of this time was spent counseling and coordinating care related to the above assessment and plan.  Signed by: Asencion Gowda, NP   Please contact Palliative Medicine Team phone at [phone] for questions and concerns.  For individual provider: See Shea Evans

## 2020-03-14 NOTE — Progress Notes (Signed)
Jason Antigua, MD [address], [place], [place], [place], [address] [address], [place], [place], [place], [address] Phone: [phone]  Fax: [phone]   Subjective:  Patient remains somnolent and lethargic.  Daughter and son-in-law at bedside.  They state patient has been taking Tylenol about 3 times a day for a long time.  They deny any previous history of alcohol abuse, or known history of liver disease  Objective: Exam: Vital signs in last 24 hours: Vitals:   03/14/20 0400 03/14/20 0823 03/14/20 1159 03/14/20 1511  BP: 118/67 118/64 124/77 130/63  Pulse: 89 89 98 70  Resp: (!) 23 20 16 20   Temp: 98 F (36.7 C) 97.9 F (36.6 C) 97.7 F (36.5 C) 97.8 F (36.6 C)  TempSrc: Oral  Oral Oral  SpO2: 97% 94% 96% 98%  Weight: 86 kg     Height:       Weight change: -0.408 kg  Intake/Output Summary (Last 24 hours) at 03/14/2020 1524 Last data filed at 03/14/2020 1259 Gross per 24 hour  Intake 120 ml  Output 250 ml  Net -130 ml    General: Somnolent Abd: Soft, NT/ND, No HSM Skin: Warm, no rashes Neck: Supple, Trachea midline   Lab Results: Lab Results  Component Value Date   WBC 12.0 (H) 03/14/2020   HGB 8.0 (L) 03/14/2020   HCT 25.9 (L) 03/14/2020   MCV 83.5 03/14/2020   PLT PLATELET CLUMPS NOTED ON SMEAR, UNABLE TO ESTIMATE 03/14/2020   Micro Results: Recent Results (from the past 240 hour(s))  Aerobic/Anaerobic Culture (surgical/deep wound)     Status: None (Preliminary result)   Collection Time: 03/26/2020 12:36 PM   Specimen: Wound  Result Value Ref Range Status   Specimen Description   Final    WOUND Performed at [facility], [address] [place], [place] [address]    Special Requests   Final    NONE Performed at [facility], [address] [place], [place] [address]    Gram Stain   Final    FEW WBC PRESENT, PREDOMINANTLY MONONUCLEAR FEW GRAM POSITIVE COCCI IN PAIRS IN CLUSTERS Performed at [facility], [place] [address] [place], [place] [address]    Culture   Final    MODERATE METHICILLIN RESISTANT STAPHYLOCOCCUS AUREUS NO ANAEROBES ISOLATED; CULTURE IN PROGRESS FOR 5 DAYS    Report Status PENDING  Incomplete   Organism ID, Bacteria METHICILLIN RESISTANT STAPHYLOCOCCUS AUREUS  Final      Susceptibility   Methicillin resistant staphylococcus aureus - MIC*    CIPROFLOXACIN >=8 RESISTANT Resistant     ERYTHROMYCIN >=8 RESISTANT Resistant     GENTAMICIN <=0.5 SENSITIVE Sensitive     OXACILLIN >=4 RESISTANT Resistant     TETRACYCLINE <=1 SENSITIVE Sensitive     VANCOMYCIN <=0.5 SENSITIVE Sensitive     TRIMETH/SULFA <=10 SENSITIVE Sensitive     CLINDAMYCIN >=8 RESISTANT Resistant     RIFAMPIN <=0.5 SENSITIVE Sensitive     Inducible Clindamycin NEGATIVE Sensitive     * MODERATE METHICILLIN RESISTANT STAPHYLOCOCCUS AUREUS  Blood Culture (routine x 2)     Status: Abnormal   Collection Time: 03/11/2020  5:55 PM   Specimen: BLOOD  Result Value Ref Range Status   Specimen Description   Final    BLOOD RIGHT ANTECUBITAL Performed at [facility], [address] [place], [place] [address]    Special Requests   Final    BOTTLES DRAWN AEROBIC AND ANAEROBIC Blood Culture  results may not be optimal due to an excessive volume of blood received in culture bottles Performed at [facility], [address] [place], [place] [address]    Culture  Setup Time   Final    GRAM POSITIVE COCCI IN BOTH AEROBIC AND ANAEROBIC BOTTLES    Culture (A)  Final    STAPHYLOCOCCUS AUREUS SUSCEPTIBILITIES PERFORMED ON PREVIOUS CULTURE WITHIN THE LAST 5 DAYS. Performed at [facility], [place] [address], [place], [place] [address]    Report Status 03/12/2020 FINAL  Final  Blood Culture (routine x 2)     Status: Abnormal   Collection Time: 03/02/2020  5:55 PM   Specimen: BLOOD  Result Value Ref Range Status   Specimen Description   Final    BLOOD LEFT ANTECUBITAL Performed at  [facility], [address] [place], [place] [address]    Special Requests   Final    BOTTLES DRAWN AEROBIC AND ANAEROBIC Blood Culture results may not be optimal due to an excessive volume of blood received in culture bottles Performed at [facility], [address], [place], [place] [address]    Culture  Setup Time   Final    ANAEROBIC BOTTLE ONLY GRAM POSITIVE COCCI AEROBIC BOTTLE ONLY CRITICAL RESULT CALLED TO, READ BACK BY AND VERIFIED WITH: ALEX CHAPPELL AT 1017 0/9/21 SDR Performed at [facility], [place] [address] [place], [place] [address]    Culture METHICILLIN RESISTANT STAPHYLOCOCCUS AUREUS (A)  Final   Report Status 03/12/2020 FINAL  Final   Organism ID, Bacteria METHICILLIN RESISTANT STAPHYLOCOCCUS AUREUS  Final      Susceptibility   Methicillin resistant staphylococcus aureus - MIC*    CIPROFLOXACIN >=8 RESISTANT Resistant     ERYTHROMYCIN >=8 RESISTANT Resistant     GENTAMICIN <=0.5 SENSITIVE Sensitive     OXACILLIN >=4 RESISTANT Resistant     TETRACYCLINE <=1 SENSITIVE Sensitive     VANCOMYCIN <=0.5 SENSITIVE Sensitive     TRIMETH/SULFA <=10 SENSITIVE Sensitive     CLINDAMYCIN >=8 RESISTANT Resistant     RIFAMPIN <=0.5 SENSITIVE Sensitive     Inducible Clindamycin NEGATIVE Sensitive     * METHICILLIN RESISTANT STAPHYLOCOCCUS AUREUS  Blood Culture ID Panel (Reflexed)     Status: Abnormal   Collection Time: 03/28/2020  5:55 PM  Result Value Ref Range Status   Enterococcus faecalis NOT DETECTED NOT DETECTED Final   Enterococcus Faecium NOT DETECTED NOT DETECTED Final   Listeria monocytogenes NOT DETECTED NOT DETECTED Final   Staphylococcus species DETECTED (A) NOT DETECTED Final    Comment: CRITICAL RESULT CALLED TO, READ BACK BY AND VERIFIED WITH:  ALEX CHAPPELL AT 1017 03/10/20 SDR    Staphylococcus aureus (BCID) DETECTED (A) NOT DETECTED Final    Comment: Methicillin (oxacillin)-resistant Staphylococcus aureus (MRSA). MRSA is  predictably resistant to beta-lactam antibiotics (except ceftaroline). Preferred therapy is vancomycin unless clinically contraindicated. Patient requires contact precautions if  hospitalized. CRITICAL RESULT CALLED TO, READ BACK BY AND VERIFIED WITH:  ALEX CHAPPELL AT 1017 03/10/20 SDR    Staphylococcus epidermidis NOT DETECTED NOT DETECTED Final   Staphylococcus lugdunensis NOT DETECTED NOT DETECTED Final   Streptococcus species NOT DETECTED NOT DETECTED Final   Streptococcus agalactiae NOT DETECTED NOT DETECTED Final   Streptococcus pneumoniae NOT DETECTED NOT DETECTED Final   Streptococcus pyogenes NOT DETECTED NOT DETECTED Final   A.calcoaceticus-baumannii NOT DETECTED NOT DETECTED Final   Bacteroides fragilis NOT DETECTED NOT DETECTED Final   Enterobacterales NOT DETECTED  NOT DETECTED Final   Enterobacter cloacae complex NOT DETECTED NOT DETECTED Final   Escherichia coli NOT DETECTED NOT DETECTED Final   Klebsiella aerogenes NOT DETECTED NOT DETECTED Final   Klebsiella oxytoca NOT DETECTED NOT DETECTED Final   Klebsiella pneumoniae NOT DETECTED NOT DETECTED Final   Proteus species NOT DETECTED NOT DETECTED Final   Salmonella species NOT DETECTED NOT DETECTED Final   Serratia marcescens NOT DETECTED NOT DETECTED Final   Haemophilus influenzae NOT DETECTED NOT DETECTED Final   Neisseria meningitidis NOT DETECTED NOT DETECTED Final   Pseudomonas aeruginosa NOT DETECTED NOT DETECTED Final   Stenotrophomonas maltophilia NOT DETECTED NOT DETECTED Final   Candida albicans NOT DETECTED NOT DETECTED Final   Candida auris NOT DETECTED NOT DETECTED Final   Candida glabrata NOT DETECTED NOT DETECTED Final   Candida krusei NOT DETECTED NOT DETECTED Final   Candida parapsilosis NOT DETECTED NOT DETECTED Final   Candida tropicalis NOT DETECTED NOT DETECTED Final   Cryptococcus neoformans/gattii NOT DETECTED NOT DETECTED Final   Meth resistant mecA/C and MREJ DETECTED (A) NOT DETECTED Final     Comment: CRITICAL RESULT CALLED TO, READ BACK BY AND VERIFIED WITH:  ALEX CHAPPELL AT 0277 03/10/20 SDR Performed at [facility], [place] [place],  [address]   Respiratory Panel by RT PCR (Flu A&B, Covid) - Nasopharyngeal Swab     Status: None   Collection Time: 03/19/2020  6:21 PM   Specimen: Nasopharyngeal Swab  Result Value Ref Range Status   SARS Coronavirus 2 by RT PCR NEGATIVE NEGATIVE Final    Comment: (NOTE) SARS-CoV-2 target nucleic acids are NOT DETECTED.  The SARS-CoV-2 RNA is generally detectable in upper respiratoy specimens during the acute phase of infection. The lowest concentration of SARS-CoV-2 viral copies this assay can detect is 131 copies/mL. A negative result does not preclude SARS-Cov-2 infection and should not be used as the sole basis for treatment or other patient management decisions. A negative result may occur with  improper specimen collection/handling, submission of specimen other than nasopharyngeal swab, presence of viral mutation(s) within the areas targeted by this assay, and inadequate number of viral copies (<131 copies/mL). A negative result must be combined with clinical observations, patient history, and epidemiological information. The expected result is Negative.  Fact Sheet for Patients:  PinkCheek.be  Fact Sheet for Healthcare Providers:  GravelBags.it  This test is no t yet approved or cleared by the [place] FDA and  has been authorized for detection and/or diagnosis of SARS-CoV-2 by FDA under an Emergency Use Authorization (EUA). This EUA will remain  in effect (meaning this test can be used) for the duration of the COVID-19 declaration under Section 564(b)(1) of the Act, 21 U.S.C. section 360bbb-3(b)(1), unless the authorization is terminated or revoked sooner.     Influenza A by PCR NEGATIVE NEGATIVE Final   Influenza B by PCR NEGATIVE  NEGATIVE Final    Comment: (NOTE) The Xpert Xpress SARS-CoV-2/FLU/RSV assay is intended as an aid in  the diagnosis of influenza from Nasopharyngeal swab specimens and  should not be used as a sole basis for treatment. Nasal washings and  aspirates are unacceptable for Xpert Xpress SARS-CoV-2/FLU/RSV  testing.  Fact Sheet for Patients: PinkCheek.be  Fact Sheet for Healthcare Providers: GravelBags.it  This test is not yet approved or cleared by the [place] FDA and  has been authorized for detection and/or diagnosis of SARS-CoV-2 by  FDA under an Emergency Use Authorization (EUA). This EUA will  remain  in effect (meaning this test can be used) for the duration of the  Covid-19 declaration under Section 564(b)(1) of the Act, 21  U.S.C. section 360bbb-3(b)(1), unless the authorization is  terminated or revoked. Performed at [facility], [address], [place], [place] [address]   Urine culture     Status: None   Collection Time: 03/23/2020  9:06 PM   Specimen: In/Out Cath Urine  Result Value Ref Range Status   Specimen Description   Final    IN/OUT CATH URINE Performed at [facility], [address] [place], [place] [address]    Special Requests   Final    NONE Performed at [facility], [address], [place], [place] [address]    Culture   Final    NO GROWTH Performed at [facility], [place] [address], [place], [place] [address]    Report Status 03/30/2020 FINAL  Final  Aerobic/Anaerobic Culture (surgical/deep wound)     Status: None (Preliminary result)   Collection Time: 03/10/20 10:55 AM   Specimen: Tissue; Wound  Result Value Ref Range Status   Specimen Description   Final    TISSUE Performed at [facility], [address] [place], [place] [address]    Special Requests   Final    LEFT PARTIAL FIRST RAY AMPUTATION Performed at [facility], [place], [place] [address]    Gram Stain   Final    RARE WBC PRESENT, PREDOMINANTLY PMN RARE GRAM POSITIVE COCCI Performed at [facility], [place] [address], [place], [place] [address]    Culture   Final    FEW METHICILLIN RESISTANT STAPHYLOCOCCUS AUREUS NO ANAEROBES ISOLATED; CULTURE IN PROGRESS FOR 5 DAYS    Report Status PENDING  Incomplete   Organism ID, Bacteria METHICILLIN RESISTANT STAPHYLOCOCCUS AUREUS  Final      Susceptibility   Methicillin resistant staphylococcus aureus - MIC*    CIPROFLOXACIN >=8 RESISTANT Resistant     ERYTHROMYCIN >=8 RESISTANT Resistant     GENTAMICIN <=0.5 SENSITIVE Sensitive     OXACILLIN >=4 RESISTANT Resistant     TETRACYCLINE <=1 SENSITIVE Sensitive     VANCOMYCIN <=0.5 SENSITIVE Sensitive     TRIMETH/SULFA <=10 SENSITIVE Sensitive     CLINDAMYCIN >=8 RESISTANT Resistant     RIFAMPIN <=0.5 SENSITIVE Sensitive     Inducible Clindamycin NEGATIVE Sensitive     * FEW METHICILLIN RESISTANT STAPHYLOCOCCUS AUREUS  MRSA PCR Screening     Status: Abnormal   Collection Time: 03/10/20  4:21 PM   Specimen: Nasopharyngeal  Result Value Ref Range Status   MRSA by PCR POSITIVE (A) NEGATIVE Final    Comment:        The GeneXpert MRSA Assay (FDA approved for NASAL specimens only), is one component of a comprehensive MRSA colonization surveillance program. It is not intended to diagnose MRSA infection nor to guide or monitor treatment for MRSA infections. RESULT CALLED TO, READ BACK BY AND VERIFIED WITH:  [facility] HARRIS AT 2694 03/10/20 SDR Performed at [facility], [address] [place], [place] [address]   CULTURE, BLOOD (ROUTINE X 2) w Reflex to ID Panel     Status: None (Preliminary result)   Collection Time: 03/31/2020  2:50 PM   Specimen: BLOOD  Result Value Ref Range Status   Specimen Description BLOOD BLOOD RIGHT HAND  Final   Special Requests   Final    BOTTLES DRAWN AEROBIC AND ANAEROBIC  Blood Culture adequate  volume   Culture   Final    NO GROWTH 3 DAYS Performed at [facility], [place] [address] [place], [place] [address]    Report Status PENDING  Incomplete  CULTURE, BLOOD (ROUTINE X 2) w Reflex to ID Panel     Status: None (Preliminary result)   Collection Time: 03/24/2020  2:59 PM   Specimen: BLOOD  Result Value Ref Range Status   Specimen Description BLOOD BLOOD RIGHT HAND  Final   Special Requests   Final    BOTTLES DRAWN AEROBIC AND ANAEROBIC Blood Culture adequate volume   Culture   Final    NO GROWTH 3 DAYS Performed at [facility], [address], [place], [place] [address]    Report Status PENDING  Incomplete   Studies/Results: No results found. Medications:  Scheduled Meds: . aspirin EC  81 mg Oral Daily  . Chlorhexidine Gluconate Cloth  6 each Topical Daily  . cholecalciferol  1,000 Units Oral Daily  . DULoxetine  20 mg Oral Daily  . famotidine  20 mg Oral Daily  . insulin aspart  0-9 Units Subcutaneous Q4H  . insulin glargine  10 Units Subcutaneous QHS  . methylPREDNISolone (SOLU-MEDROL) injection  40 mg Intravenous Q12H  . mupirocin ointment  1 application Nasal BID  . pantoprazole (PROTONIX) IV  40 mg Intravenous Q24H  . vancomycin variable dose per unstable renal function (pharmacist dosing)   Does not apply See admin instructions   Continuous Infusions: . sodium chloride 100 mL/hr at 03/14/20 1133  . 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 [facility])   Essential hypertension   History of stroke with current residual effects   Acute renal failure superimposed on stage 3a chronic kidney disease ([place])   Subacute osteomyelitis of left foot  (HCC)   Aspiration pneumonia (HCC)   Chronic systolic congestive heart failure (HCC)   Hyperammonemia (HCC)   Diabetic foot ulcer associated with type 2 diabetes mellitus (HCC)   Severe sepsis with acute organ dysfunction (HCC)   Elevated troponin   Chronic anemia   Severe sepsis (HCC)   Abnormal LFTs   Atherosclerosis of native arteries of the extremities with ulceration ([place])    Plan: Liver enzymes continue to improve INR elevated, recommend repeat dose of vitamin K today No signs of bleeding  He continues to have altered mental status As per hospitalist note, palliative consult pending to establish goals of care  If patient remains full code and if liver function does not continue to improve, consider transfer to liver transplant center  However, if he continues to improve with treatment of underlying cause, he would not need transfer  Continue to avoid hepatotoxic drugs  Tylenol level is less than 10 on admission  Additional labs ordered yesterday pending   LOS: 5 days   Jason Antigua, MD 03/14/2020, 3:24 PM

## 2020-03-14 NOTE — Progress Notes (Signed)
[facility] hospital liaison note:  New referral for family interest in [facility] received from Palliative NP [employer]. TOC Bridget Cobb made aware. Patient information sent to referral. Hospice home eligibility pending.   Writer met in the room with patient's daughter Nira Conn and son in law Delfino Lovett to initiate education regarding hospice services, philosophy, team approach to care and current visitation policy with understanding voiced. Questions answered. Emotional support provided. [facility] is unable to offer a bed today. Family and hospital care team are aware.  Liaison will continue to follow and update daily family and hospital care team regarding bed availability. Thank you for the opportunity to be involved in the care of this patient and his family.  Flo Shanks BSN, RN, [place] [phone]

## 2020-03-14 NOTE — Progress Notes (Signed)
ID  Jason Montes is now comfort care and all aggressive intervention being withdrawn  Patient Vitals for the past 24 hrs:  BP Temp Temp src Pulse Resp SpO2 Weight  03/14/20 0823 118/64 97.9 F (36.6 C) -- 89 20 94 % --  03/14/20 0400 118/67 98 F (36.7 C) Oral 89 (!) 23 97 % 86 kg  03/13/20 1936 128/68 97.9 F (36.6 C) -- 89 16 98 % --  03/13/20 1637 117/63 97.6 F (36.4 C) Oral 81 16 98 % --  03/13/20 1145 134/63 98.4 F (36.9 C) Axillary 88 18 100 % --     CBC Latest Ref Rng & Units 03/14/2020 03/13/2020 03/12/2020  WBC 4.0 - 10.5 K/uL 12.0(H) 14.6(H) 22.9(H)  Hemoglobin 13.0 - 17.0 g/dL 8.0(L) 8.3(L) 9.0(L)  Hematocrit 39 - 52 % 25.9(L) 28.4(L) 31.3(L)  Platelets 150 - 400 K/uL PLATELET CLUMPS NOTED ON SMEAR, UNABLE TO ESTIMATE PLATELET CLUMPS NOTED ON SMEAR, UNABLE TO ESTIMATE 75(L)    CMP Latest Ref Rng & Units 03/14/2020 03/13/2020 03/12/2020  Glucose 70 - 99 mg/dL 264(H) 218(H) 138(H)  BUN 8 - 23 mg/dL 76(H) 59(H) 49(H)  Creatinine 0.61 - 1.24 mg/dL 1.91(H) 1.87(H) 1.84(H)  Sodium 135 - 145 mmol/L 142 139 139  Potassium 3.5 - 5.1 mmol/L 4.2 4.2 3.9  Chloride 98 - 111 mmol/L 115(H) 111 109  CO2 22 - 32 mmol/L 13(L) 13(L) 17(L)  Calcium 8.9 - 10.3 mg/dL 8.0(L) 7.9(L) 7.8(L)  Total Protein 6.5 - 8.1 g/dL 7.6 7.8 8.1  Total Bilirubin 0.3 - 1.2 mg/dL 2.7(H) 2.5(H) 2.0(H)  Alkaline Phos 38 - 126 U/L 125 113 104  AST 15 - 41 U/L 1,266(H) 2,756(H) 4,704(H)  ALT 0 - 44 U/L 2,226(H) 2,825(H) 3,337(H)    Impression/recommendation Septic shock with MRSA bacteremia. Shock resolved source is the left great toe ulcer with  chronic osteomyelitis- s/p ray excision on 03/22/2020 and bone culture is MRSA Patient has a pacemaker and hence concern for endocarditis 2D echo done on 03/10/2020 showed an EF of 40 to 45%. The aortic valve was not well visualized. The mitral valve was normal in structure. Limited study secondary to patient terminating study prematurely. Will need TEE but not stable  to get it Continue vanco ( monitor closely)   Altered mental status- encephalopathy ? Due to liver failure - watch for ICH  CKD-  ? Left great toe amputaion site infection with staph- underwent ray excision 03/10/2020  Left heel ulcer- this could be due to MRSA- as well- need culture  Acutely increased transaminases > 4000- could be from shock liver, sepsis ( was also started on amiodarone in Sept 2021during last admission- could that have contributed as well??)- Neg hepatitis panel Transaminases improving Seen by GI   with high INR, High PTT he has acute liver failure-   Thrombocytopenia, with abnormal coags could be DIC or liver failure Risk for intracranial bleed   Anemia  Hypoalbuminemia with anasarca. Colon wall edema and mesenteric edema is all likely related to the hypoalbuminemia. I doubt he has infectious colitis. Abdomen soft, no diarrhea   Even though he has bilateral perinephric stranding his urine examination does not show any evidence of infection. This likely would be again due to intra-abdominal edema from hypoalbuminemia.   H/O fall 6/14-6/24/21 11/16/19- fall intertrochanteric fracture RT Intramedullary fixation of rt intertrochanteric fracture  01/11/20- admitted for  OD on tramdaol, flexeril and gabepentin  H/o Cardiac arrest while running 5k and anoxic brain injury was in coma for 4  months-5 years ago  H/o CVA   Discussed with palliative team Jason Montes is now comfort care ID will sign off

## 2020-03-15 ENCOUNTER — Encounter: Admission: EM | Disposition: E | Payer: Self-pay | Source: Home / Self Care | Attending: Internal Medicine

## 2020-03-15 DIAGNOSIS — G9341 Metabolic encephalopathy: Secondary | ICD-10-CM | POA: Diagnosis not present

## 2020-03-15 DIAGNOSIS — K7200 Acute and subacute hepatic failure without coma: Secondary | ICD-10-CM | POA: Diagnosis not present

## 2020-03-15 LAB — AEROBIC/ANAEROBIC CULTURE W GRAM STAIN (SURGICAL/DEEP WOUND)

## 2020-03-15 LAB — CMV IGM: CMV IgM: <30 AU/mL (ref 0.0–29.9)

## 2020-03-15 LAB — MITOCHONDRIAL ANTIBODIES: Mitochondrial M2 Ab, IgG: <20 Units (ref 0.0–20.0)

## 2020-03-15 LAB — ANTI-SMOOTH MUSCLE ANTIBODY, IGG: F-Actin IgG: 45 Units — ABNORMAL HIGH (ref 0–19)

## 2020-03-15 SURGERY — LOWER EXTREMITY ANGIOGRAPHY
Anesthesia: Moderate Sedation | Laterality: Left

## 2020-03-15 NOTE — Progress Notes (Signed)
[facility]) Hospital Liaison RN note:  Visited with patient in the room. He was resting quietly with eyes open. Spoke with daughter, Nira Conn, on the phone to update her. Reminded to bring card regarding patient's ICD when she comes today and she states that she will.  Unfortunately, Hospice Home does not have a bed available to offer today. Family and hospital care team is aware. [facility] Liaison will continue to follow for bed availability.  Please call with any hospice related questions or concerns.  Thank you.  Zandra Abts, RN [facility] Liaison [phone]

## 2020-03-15 NOTE — Progress Notes (Signed)
T/C to [employer] regarding the ICD per MD order. [employer] does not have a patient by this name.

## 2020-03-15 NOTE — Progress Notes (Signed)
T/C to the pts dtr to find out the name of the ICD company the patient has. The pts dtr stated they are trying to figure it out.l

## 2020-03-15 NOTE — Plan of Care (Signed)

## 2020-03-15 NOTE — Progress Notes (Signed)
[place] in the room to turn off the ICD.

## 2020-03-15 NOTE — Progress Notes (Signed)
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[facility])   Essential hypertension   History of stroke with current residual effects   Acute renal failure superimposed on stage 3a chronic kidney disease ([place])   Subacute osteomyelitis of left foot (HCC)   Aspiration pneumonia (HCC)   Chronic systolic congestive heart failure (HCC)   Hyperammonemia (HCC)   Diabetic foot ulcer associated with type 2 diabetes mellitus (HCC)   Severe sepsis with acute organ dysfunction (HCC)   Elevated troponin   Chronic anemia   Severe sepsis (HCC)   Abnormal LFTs   Atherosclerosis of native arteries of the extremities with ulceration ([place])   Comfort care With assistance from palliative care family decision to transition to comfort care yesterday - continue prn robinul, haldol, lorazepam, morphine - no current inpatient hospice beds available  Septic shock 2/2 bacteremia With acute organ dysfunction. Presented w/ fever, tachycardia, elevated WBC, elevated lactic acid & end organ dysfunction (AKI, transaminitis, & encephalopathy). Blood cultures growing MRSA, source likely known left foot osteo.  - off abx given comfort care  PVD: w/ hx of amputation at right midfoot, left MTP & chronic osteomyelitis of left foot. Seen by his podiatrist on 10/8 for necrotic wound left foot with chronic osteomyelitis.  Likely source of infection. 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 Had planned for LLE angiogram - no further eval/tx given comfort care  Transaminitis: severe & labile, etiology unclear, possibly from shock liver. LFTs slightly downtrending. [place] abd shows no hepatobiliary abnormality  identified. Hepatitis panel neg. GI consulted, appreciate recs. Further labs ordered, results pending - no further evaluation given comfort care  Thrombocytopenia: Likely 2/2 bacteremia. With petechiae, elevated pt/ptt, ldh, fibrin. S/p vitamin k 10 mg IV x2. Heme consulted - no further eval/tx  Aspiration pneumonia: continue on IV abxs. Appears resolved.  Acute metabolic encephalopathy: likely secondary to infection & psychotropics.  AKI on CKDIIIa   A. fib: w/ RVR.  Elevated troponin: likely secondary to demand ischemia. troponins stable. Echo shows EF 40-45%, mildly decreased LV which very similar to pt's echo in June 2021.   DM2: poorly controlled.   HTN  Chronic systolic CHF   Chronic anemia:    DVT prophylaxis: SCDs Code Status: DNR Family Communication:  Disposition Plan: likely d/c to SNF  Status is: Inpatient  Remains inpatient appropriate because:Unsafe d/c plan   Dispo: The patient is from: home               Anticipated d/c is to: inpatient hospice              Anticipated d/c date is: 1-3 days              Patient currently is not safe to discharge home   Consultants:  ID: Dr. Delaine Lame Podiatry: Dr. Luana Shu  Vascular surg: Dr. Trula Slade Hematology: Dr. Janese Banks  GI: Bonna Gains  Cardio: Dr. Neita Carp   Procedures:    Antimicrobials: s/p vancomycin   Subjective: Asleep, not arousable.  Objective: Vitals:   03/14/20 1159 03/14/20 1511 03/31/2020 0441 03/09/2020 0915  BP: 124/77 130/63 132/68 (!) 116/57  Pulse: 98 70 89 87  Resp: 16 20  18   Temp: 97.7  F (36.5 C) 97.8 F (36.6 C) 97.8 F (36.6 C) (!) 97.5 F (36.4 C)  TempSrc: Oral Oral Oral Oral  SpO2: 96% 98% 100% 100%  Weight:      Height:        Intake/Output Summary (Last 24 hours) at 03/16/2020 0945 Last data filed at 03/14/2020 1623 Gross per 24 hour  Intake 1586.19 ml  Output 50 ml  Net 1536.19 ml   Filed Weights   03/10/20 1617 03/13/20 0420 03/14/20 0400  Weight: 81.1 kg  86.4 kg 86 kg    Examination:  General exam: Asleep, arousable Respiratory system: diminished breath sounds b/l. Rales at bases Cardiovascular system: S1 &S2+. No rubs or gallops. Soft systolic murmur Gastrointestinal system: Abdomen is nondistended, soft and nontender. Hypoactive bowel sounds heard. Central nervous system: Appears lethargic. Moves all 4 extremities  Extremities: left great toe amputation that is dressed. Right transmetatarsal amputation. Petechiae on LLE  Psychiatry: unable to assess    Data Reviewed: I have personally reviewed following labs and imaging studies  CBC: Recent Labs  Lab 03/10/20 0500 03/26/2020 0444 03/12/20 0338 03/13/20 0459 03/14/20 0518  WBC 27.6* 22.6* 22.9* 14.6* 12.0*  HGB 7.8* 8.2* 9.0* 8.3* 8.0*  HCT 25.6* 27.0* 31.3* 28.4* 25.9*  MCV 84.2 84.4 86.2 87.1 83.5  PLT 186 129* 75* PLATELET CLUMPS NOTED ON SMEAR, UNABLE TO ESTIMATE PLATELET CLUMPS NOTED ON SMEAR, UNABLE TO ESTIMATE   Basic Metabolic Panel: Recent Labs  Lab 03/10/20 0500 03/23/2020 0444 03/12/20 0338 03/13/20 0459 03/14/20 0518  NA 133* 135 139 139 142  K 3.7 3.8 3.9 4.2 4.2  CL 104 105 109 111 115*  CO2 17* 20* 17* 13* 13*  GLUCOSE 172* 174* 138* 218* 264*  BUN 25* 36* 49* 59* 76*  CREATININE 1.62* 1.68* 1.84* 1.87* 1.91*  CALCIUM 7.8* 7.6* 7.8* 7.9* 8.0*   GFR: Estimated Creatinine Clearance: 40.8 mL/min (A) (by C-G formula based on SCr of 1.91 mg/dL (H)). Liver Function Tests: Recent Labs  Lab 03/10/20 0500 03/13/2020 0444 03/12/20 0338 03/13/20 0459 03/14/20 0518  AST 228* 2,306* 4,704* 2,756* 1,266*  ALT 153* 1,641* 3,337* 2,825* 2,226*  ALKPHOS 90 92 104 113 125  BILITOT 1.4* 1.7* 2.0* 2.5* 2.7*  PROT 7.9 8.2* 8.1 7.8 7.6  ALBUMIN 2.5* 2.6* 2.5* 2.5* 2.4*   No results for input(s): LIPASE, AMYLASE in the last 168 hours. Recent Labs  Lab 03/02/2020 1755 03/13/20 1836  AMMONIA 50* 34   Coagulation Profile: Recent Labs  Lab 03/05/2020 1755  03/10/20 0500 03/12/20 0758 03/13/20 0459 03/14/20 0518  INR 1.8* 2.7* 5.6* 6.4* 6.6*   Cardiac Enzymes: No results for input(s): CKTOTAL, CKMB, CKMBINDEX, TROPONINI in the last 168 hours. BNP (last 3 results) No results for input(s): PROBNP in the last 8760 hours. HbA1C: No results for input(s): HGBA1C in the last 72 hours. CBG: Recent Labs  Lab 03/13/20 2043 03/13/20 2358 03/14/20 0428 03/14/20 0822 03/14/20 1159  GLUCAP 223* 233* 229* 230* 235*   Lipid Profile: No results for input(s): CHOL, HDL, LDLCALC, TRIG, CHOLHDL, LDLDIRECT in the last 72 hours. Thyroid Function Tests: No results for input(s): TSH, T4TOTAL, FREET4, T3FREE, THYROIDAB in the last 72 hours. Anemia Panel: Recent Labs    03/13/20 0459  VITAMINB12 >7,500*  FOLATE 35.0  FERRITIN 143  TIBC 312  IRON 55  RETICCTPCT 2.3   Sepsis Labs: Recent Labs  Lab 03/03/2020 1755 03/30/2020 2000 03/11/20 0444 03/12/20 0338  PROCALCITON <0.10  --  28.61 21.04  LATICACIDVEN  11.0* 2.9*  --   --     Recent Results (from the past 240 hour(s))  Aerobic/Anaerobic Culture (surgical/deep wound)     Status: None (Preliminary result)   Collection Time: 03/28/2020 12:36 PM   Specimen: Wound  Result Value Ref Range Status   Specimen Description   Final    WOUND Performed at [facility], [address], [place], [place] [address]    Special Requests   Final    NONE Performed at [facility], [address] [place], [place] [address]    Gram Stain   Final    FEW WBC PRESENT, PREDOMINANTLY MONONUCLEAR FEW GRAM POSITIVE COCCI IN PAIRS IN CLUSTERS Performed at [facility] [address], [place], [place] [address]    Culture   Final    MODERATE METHICILLIN RESISTANT STAPHYLOCOCCUS AUREUS NO ANAEROBES ISOLATED; CULTURE IN PROGRESS FOR 5 DAYS    Report Status PENDING  Incomplete   Organism ID, Bacteria METHICILLIN RESISTANT STAPHYLOCOCCUS AUREUS  Final      Susceptibility   Methicillin  resistant staphylococcus aureus - MIC*    CIPROFLOXACIN >=8 RESISTANT Resistant     ERYTHROMYCIN >=8 RESISTANT Resistant     GENTAMICIN <=0.5 SENSITIVE Sensitive     OXACILLIN >=4 RESISTANT Resistant     TETRACYCLINE <=1 SENSITIVE Sensitive     VANCOMYCIN <=0.5 SENSITIVE Sensitive     TRIMETH/SULFA <=10 SENSITIVE Sensitive     CLINDAMYCIN >=8 RESISTANT Resistant     RIFAMPIN <=0.5 SENSITIVE Sensitive     Inducible Clindamycin NEGATIVE Sensitive     * MODERATE METHICILLIN RESISTANT STAPHYLOCOCCUS AUREUS  Blood Culture (routine x 2)     Status: Abnormal   Collection Time: 03/22/2020  5:55 PM   Specimen: BLOOD  Result Value Ref Range Status   Specimen Description   Final    BLOOD RIGHT ANTECUBITAL Performed at [facility], [address] [place], [place] [address]    Special Requests   Final    BOTTLES DRAWN AEROBIC AND ANAEROBIC Blood Culture results may not be optimal due to an excessive volume of blood received in culture bottles Performed at [facility], [address] [place], [place] [address]    Culture  Setup Time   Final    GRAM POSITIVE COCCI IN BOTH AEROBIC AND ANAEROBIC BOTTLES    Culture (A)  Final    STAPHYLOCOCCUS AUREUS SUSCEPTIBILITIES PERFORMED ON PREVIOUS CULTURE WITHIN THE LAST 5 DAYS. Performed at [facility], [place] [address] [place], [place] [address]    Report Status 03/12/2020 FINAL  Final  Blood Culture (routine x 2)     Status: Abnormal   Collection Time: 03/28/2020  5:55 PM   Specimen: BLOOD  Result Value Ref Range Status   Specimen Description   Final    BLOOD LEFT ANTECUBITAL Performed at [facility], [address] [place], [place] [address]    Special Requests   Final    BOTTLES DRAWN AEROBIC AND ANAEROBIC Blood Culture results may not be optimal due to an excessive volume of blood received in culture bottles Performed at [facility], [address] [place], [place] [address]    Culture  Setup  Time   Final    ANAEROBIC BOTTLE ONLY GRAM POSITIVE COCCI AEROBIC BOTTLE ONLY CRITICAL RESULT CALLED TO, READ BACK BY AND VERIFIED WITH: ALEX CHAPPELL AT 1017 0/9/21 SDR Performed at [facility], [place] [address] [place], [place] [address]  Culture METHICILLIN RESISTANT STAPHYLOCOCCUS AUREUS (A)  Final   Report Status 03/12/2020 FINAL  Final   Organism ID, Bacteria METHICILLIN RESISTANT STAPHYLOCOCCUS AUREUS  Final      Susceptibility   Methicillin resistant staphylococcus aureus - MIC*    CIPROFLOXACIN >=8 RESISTANT Resistant     ERYTHROMYCIN >=8 RESISTANT Resistant     GENTAMICIN <=0.5 SENSITIVE Sensitive     OXACILLIN >=4 RESISTANT Resistant     TETRACYCLINE <=1 SENSITIVE Sensitive     VANCOMYCIN <=0.5 SENSITIVE Sensitive     TRIMETH/SULFA <=10 SENSITIVE Sensitive     CLINDAMYCIN >=8 RESISTANT Resistant     RIFAMPIN <=0.5 SENSITIVE Sensitive     Inducible Clindamycin NEGATIVE Sensitive     * METHICILLIN RESISTANT STAPHYLOCOCCUS AUREUS  Blood Culture ID Panel (Reflexed)     Status: Abnormal   Collection Time: 03/29/2020  5:55 PM  Result Value Ref Range Status   Enterococcus faecalis NOT DETECTED NOT DETECTED Final   Enterococcus Faecium NOT DETECTED NOT DETECTED Final   Listeria monocytogenes NOT DETECTED NOT DETECTED Final   Staphylococcus species DETECTED (A) NOT DETECTED Final    Comment: CRITICAL RESULT CALLED TO, READ BACK BY AND VERIFIED WITH:  ALEX CHAPPELL AT 1017 03/10/20 SDR    Staphylococcus aureus (BCID) DETECTED (A) NOT DETECTED Final    Comment: Methicillin (oxacillin)-resistant Staphylococcus aureus (MRSA). MRSA is predictably resistant to beta-lactam antibiotics (except ceftaroline). Preferred therapy is vancomycin unless clinically contraindicated. Patient requires contact precautions if  hospitalized. CRITICAL RESULT CALLED TO, READ BACK BY AND VERIFIED WITH:  ALEX CHAPPELL AT 1017 03/10/20 SDR    Staphylococcus epidermidis NOT DETECTED NOT DETECTED  Final   Staphylococcus lugdunensis NOT DETECTED NOT DETECTED Final   Streptococcus species NOT DETECTED NOT DETECTED Final   Streptococcus agalactiae NOT DETECTED NOT DETECTED Final   Streptococcus pneumoniae NOT DETECTED NOT DETECTED Final   Streptococcus pyogenes NOT DETECTED NOT DETECTED Final   A.calcoaceticus-baumannii NOT DETECTED NOT DETECTED Final   Bacteroides fragilis NOT DETECTED NOT DETECTED Final   Enterobacterales NOT DETECTED NOT DETECTED Final   Enterobacter cloacae complex NOT DETECTED NOT DETECTED Final   Escherichia coli NOT DETECTED NOT DETECTED Final   Klebsiella aerogenes NOT DETECTED NOT DETECTED Final   Klebsiella oxytoca NOT DETECTED NOT DETECTED Final   Klebsiella pneumoniae NOT DETECTED NOT DETECTED Final   Proteus species NOT DETECTED NOT DETECTED Final   Salmonella species NOT DETECTED NOT DETECTED Final   Serratia marcescens NOT DETECTED NOT DETECTED Final   Haemophilus influenzae NOT DETECTED NOT DETECTED Final   Neisseria meningitidis NOT DETECTED NOT DETECTED Final   Pseudomonas aeruginosa NOT DETECTED NOT DETECTED Final   Stenotrophomonas maltophilia NOT DETECTED NOT DETECTED Final   Candida albicans NOT DETECTED NOT DETECTED Final   Candida auris NOT DETECTED NOT DETECTED Final   Candida glabrata NOT DETECTED NOT DETECTED Final   Candida krusei NOT DETECTED NOT DETECTED Final   Candida parapsilosis NOT DETECTED NOT DETECTED Final   Candida tropicalis NOT DETECTED NOT DETECTED Final   Cryptococcus neoformans/gattii NOT DETECTED NOT DETECTED Final   Meth resistant mecA/C and MREJ DETECTED (A) NOT DETECTED Final    Comment: CRITICAL RESULT CALLED TO, READ BACK BY AND VERIFIED WITH:  ALEX CHAPPELL AT 0998 03/10/20 SDR Performed at [facility], [address] [place], [place] [address]   Respiratory Panel by RT PCR (Flu A&B, Covid) - Nasopharyngeal Swab     Status: None   Collection Time: 03/16/2020  6:21 PM   Specimen:  Nasopharyngeal Swab   Result Value Ref Range Status   SARS Coronavirus 2 by RT PCR NEGATIVE NEGATIVE Final    Comment: (NOTE) SARS-CoV-2 target nucleic acids are NOT DETECTED.  The SARS-CoV-2 RNA is generally detectable in upper respiratoy specimens during the acute phase of infection. The lowest concentration of SARS-CoV-2 viral copies this assay can detect is 131 copies/mL. A negative result does not preclude SARS-Cov-2 infection and should not be used as the sole basis for treatment or other patient management decisions. A negative result may occur with  improper specimen collection/handling, submission of specimen other than nasopharyngeal swab, presence of viral mutation(s) within the areas targeted by this assay, and inadequate number of viral copies (<131 copies/mL). A negative result must be combined with clinical observations, patient history, and epidemiological information. The expected result is Negative.  Fact Sheet for Patients:  PinkCheek.be  Fact Sheet for Healthcare Providers:  GravelBags.it  This test is no t yet approved or cleared by the [place] FDA and  has been authorized for detection and/or diagnosis of SARS-CoV-2 by FDA under an Emergency Use Authorization (EUA). This EUA will remain  in effect (meaning this test can be used) for the duration of the COVID-19 declaration under Section 564(b)(1) of the Act, 21 U.S.C. section 360bbb-3(b)(1), unless the authorization is terminated or revoked sooner.     Influenza A by PCR NEGATIVE NEGATIVE Final   Influenza B by PCR NEGATIVE NEGATIVE Final    Comment: (NOTE) The Xpert Xpress SARS-CoV-2/FLU/RSV assay is intended as an aid in  the diagnosis of influenza from Nasopharyngeal swab specimens and  should not be used as a sole basis for treatment. Nasal washings and  aspirates are unacceptable for Xpert Xpress SARS-CoV-2/FLU/RSV  testing.  Fact Sheet for  Patients: PinkCheek.be  Fact Sheet for Healthcare Providers: GravelBags.it  This test is not yet approved or cleared by the [place] FDA and  has been authorized for detection and/or diagnosis of SARS-CoV-2 by  FDA under an Emergency Use Authorization (EUA). This EUA will remain  in effect (meaning this test can be used) for the duration of the  Covid-19 declaration under Section 564(b)(1) of the Act, 21  U.S.C. section 360bbb-3(b)(1), unless the authorization is  terminated or revoked. Performed at [facility], [address] [place], [place] [address]   Urine culture     Status: None   Collection Time: 03/05/2020  9:06 PM   Specimen: In/Out Cath Urine  Result Value Ref Range Status   Specimen Description   Final    IN/OUT CATH URINE Performed at [facility], [address] [place], [place] [address]    Special Requests   Final    NONE Performed at [facility], [address] [place], [place] [address]    Culture   Final    NO GROWTH Performed at [facility], [place] [address] [place], [place] [address]    Report Status 03/10/2020 FINAL  Final  Aerobic/Anaerobic Culture (surgical/deep wound)     Status: None (Preliminary result)   Collection Time: 03/10/20 10:55 AM   Specimen: Tissue; Wound  Result Value Ref Range Status   Specimen Description   Final    TISSUE Performed at [facility], [address], [place],  [address]    Special Requests   Final    LEFT PARTIAL FIRST RAY AMPUTATION Performed at [facility], [address] [place],  [address]    Gram Stain  Final    RARE WBC PRESENT, PREDOMINANTLY PMN RARE GRAM POSITIVE COCCI Performed at [facility], [place] [address] [place], [place] [address]    Culture   Final    FEW METHICILLIN RESISTANT STAPHYLOCOCCUS AUREUS NO ANAEROBES ISOLATED; CULTURE IN PROGRESS FOR 5 DAYS     Report Status PENDING  Incomplete   Organism ID, Bacteria METHICILLIN RESISTANT STAPHYLOCOCCUS AUREUS  Final      Susceptibility   Methicillin resistant staphylococcus aureus - MIC*    CIPROFLOXACIN >=8 RESISTANT Resistant     ERYTHROMYCIN >=8 RESISTANT Resistant     GENTAMICIN <=0.5 SENSITIVE Sensitive     OXACILLIN >=4 RESISTANT Resistant     TETRACYCLINE <=1 SENSITIVE Sensitive     VANCOMYCIN <=0.5 SENSITIVE Sensitive     TRIMETH/SULFA <=10 SENSITIVE Sensitive     CLINDAMYCIN >=8 RESISTANT Resistant     RIFAMPIN <=0.5 SENSITIVE Sensitive     Inducible Clindamycin NEGATIVE Sensitive     * FEW METHICILLIN RESISTANT STAPHYLOCOCCUS AUREUS  MRSA PCR Screening     Status: Abnormal   Collection Time: 03/10/20  4:21 PM   Specimen: Nasopharyngeal  Result Value Ref Range Status   MRSA by PCR POSITIVE (A) NEGATIVE Final    Comment:        The GeneXpert MRSA Assay (FDA approved for NASAL specimens only), is one component of a comprehensive MRSA colonization surveillance program. It is not intended to diagnose MRSA infection nor to guide or monitor treatment for MRSA infections. RESULT CALLED TO, READ BACK BY AND VERIFIED WITH:  [facility] HARRIS AT 1729 03/10/20 SDR Performed at [facility], [address] [place], [place] [address]   CULTURE, BLOOD (ROUTINE X 2) w Reflex to ID Panel     Status: None (Preliminary result)   Collection Time: 04/01/2020  2:50 PM   Specimen: BLOOD  Result Value Ref Range Status   Specimen Description BLOOD BLOOD RIGHT HAND  Final   Special Requests   Final    BOTTLES DRAWN AEROBIC AND ANAEROBIC Blood Culture adequate volume   Culture   Final    NO GROWTH 4 DAYS Performed at [facility], [address], [place], [place] [address]    Report Status PENDING  Incomplete  CULTURE, BLOOD (ROUTINE X 2) w Reflex to ID Panel     Status: None (Preliminary result)   Collection Time: 03/31/2020  2:59 PM   Specimen: BLOOD  Result Value Ref  Range Status   Specimen Description BLOOD BLOOD RIGHT HAND  Final   Special Requests   Final    BOTTLES DRAWN AEROBIC AND ANAEROBIC Blood Culture adequate volume   Culture   Final    NO GROWTH 4 DAYS Performed at [facility], [address], [place], [place] [address]    Report Status PENDING  Incomplete         Radiology Studies: No results found.      Scheduled Meds: . Chlorhexidine Gluconate Cloth  6 each Topical Daily  . sodium chloride flush  3 mL Intravenous Q12H   Continuous Infusions:    LOS: 6 days    Time spent: 25 mins     Desma Maxim, MD [facility]  If 7PM-7AM, please contact night-coverage [email or web address] 03/16/2020, 9:45 AM

## 2020-03-15 NOTE — Progress Notes (Signed)
T/C to [facility]. Jude to see about the pts ICD per MD order. [facility] does not have a patient by this name. Message sent to the Palliative Care NP.

## 2020-03-15 NOTE — Progress Notes (Signed)
Jason Antigua, MD [address], [place], [place], [place], [address] [address], [place], [place], [place], [address] Phone: [phone]  Fax: [phone]   Subjective: Patient awake today but still confused.  Unable to answer any questions   Objective: Exam: Vital signs in last 24 hours: Vitals:   03/14/20 1159 03/14/20 1511 03/19/2020 0441 03/06/2020 0915  BP: 124/77 130/63 132/68 (!) 116/57  Pulse: 98 70 89 87  Resp: 16 20  18   Temp: 97.7 F (36.5 C) 97.8 F (36.6 C) 97.8 F (36.6 C) (!) 97.5 F (36.4 C)  TempSrc: Oral Oral Oral Axillary  SpO2: 96% 98% 100% 100%  Weight:      Height:       Weight change:   Intake/Output Summary (Last 24 hours) at 03/09/2020 1152 Last data filed at 03/31/2020 0900 Gross per 24 hour  Intake 1586.19 ml  Output 400 ml  Net 1186.19 ml    General: Awake, confused Abd: Soft, NT/ND, No HSM Skin: Warm, no rashes Neck: Supple, Trachea midline   Lab Results: Lab Results  Component Value Date   WBC 12.0 (H) 03/14/2020   HGB 8.0 (L) 03/14/2020   HCT 25.9 (L) 03/14/2020   MCV 83.5 03/14/2020   PLT PLATELET CLUMPS NOTED ON SMEAR, UNABLE TO ESTIMATE 03/14/2020   Micro Results: Recent Results (from the past 240 hour(s))  Aerobic/Anaerobic Culture (surgical/deep wound)     Status: None (Preliminary result)   Collection Time: 03/13/2020 12:36 PM   Specimen: Wound  Result Value Ref Range Status   Specimen Description   Final    WOUND Performed at [facility], [address] [place], [place] [address]    Special Requests   Final    NONE Performed at [facility], [address] [place], [place] [address]    Gram Stain   Final    FEW WBC PRESENT, PREDOMINANTLY MONONUCLEAR FEW GRAM POSITIVE COCCI IN PAIRS IN CLUSTERS Performed at [facility], [address] [place], [place] [address]    Culture   Final    MODERATE METHICILLIN RESISTANT STAPHYLOCOCCUS AUREUS NO ANAEROBES ISOLATED; CULTURE  IN PROGRESS FOR 5 DAYS    Report Status PENDING  Incomplete   Organism ID, Bacteria METHICILLIN RESISTANT STAPHYLOCOCCUS AUREUS  Final      Susceptibility   Methicillin resistant staphylococcus aureus - MIC*    CIPROFLOXACIN >=8 RESISTANT Resistant     ERYTHROMYCIN >=8 RESISTANT Resistant     GENTAMICIN <=0.5 SENSITIVE Sensitive     OXACILLIN >=4 RESISTANT Resistant     TETRACYCLINE <=1 SENSITIVE Sensitive     VANCOMYCIN <=0.5 SENSITIVE Sensitive     TRIMETH/SULFA <=10 SENSITIVE Sensitive     CLINDAMYCIN >=8 RESISTANT Resistant     RIFAMPIN <=0.5 SENSITIVE Sensitive     Inducible Clindamycin NEGATIVE Sensitive     * MODERATE METHICILLIN RESISTANT STAPHYLOCOCCUS AUREUS  Blood Culture (routine x 2)     Status: Abnormal   Collection Time: 03/30/2020  5:55 PM   Specimen: BLOOD  Result Value Ref Range Status   Specimen Description   Final    BLOOD RIGHT ANTECUBITAL Performed at [facility], [address] [place], [place] [address]    Special Requests   Final    BOTTLES DRAWN AEROBIC AND ANAEROBIC Blood Culture results may not be optimal due to an excessive volume of blood received in culture bottles Performed at [facility], [address] [place], [place] [address]  Culture  Setup Time   Final    GRAM POSITIVE COCCI IN BOTH AEROBIC AND ANAEROBIC BOTTLES    Culture (A)  Final    STAPHYLOCOCCUS AUREUS SUSCEPTIBILITIES PERFORMED ON PREVIOUS CULTURE WITHIN THE LAST 5 DAYS. Performed at [facility], [place] [address], [place], [place] [address]    Report Status 03/12/2020 FINAL  Final  Blood Culture (routine x 2)     Status: Abnormal   Collection Time: 03/23/2020  5:55 PM   Specimen: BLOOD  Result Value Ref Range Status   Specimen Description   Final    BLOOD LEFT ANTECUBITAL Performed at [facility], [address] [place], [place] [address]    Special Requests   Final    BOTTLES DRAWN AEROBIC AND ANAEROBIC Blood Culture results may not  be optimal due to an excessive volume of blood received in culture bottles Performed at [facility], [address] [place], [place] [address]    Culture  Setup Time   Final    ANAEROBIC BOTTLE ONLY GRAM POSITIVE COCCI AEROBIC BOTTLE ONLY CRITICAL RESULT CALLED TO, READ BACK BY AND VERIFIED WITH: ALEX CHAPPELL AT 1017 0/9/21 SDR Performed at [facility], [place] [address], [place], [place] [address]    Culture METHICILLIN RESISTANT STAPHYLOCOCCUS AUREUS (A)  Final   Report Status 03/12/2020 FINAL  Final   Organism ID, Bacteria METHICILLIN RESISTANT STAPHYLOCOCCUS AUREUS  Final      Susceptibility   Methicillin resistant staphylococcus aureus - MIC*    CIPROFLOXACIN >=8 RESISTANT Resistant     ERYTHROMYCIN >=8 RESISTANT Resistant     GENTAMICIN <=0.5 SENSITIVE Sensitive     OXACILLIN >=4 RESISTANT Resistant     TETRACYCLINE <=1 SENSITIVE Sensitive     VANCOMYCIN <=0.5 SENSITIVE Sensitive     TRIMETH/SULFA <=10 SENSITIVE Sensitive     CLINDAMYCIN >=8 RESISTANT Resistant     RIFAMPIN <=0.5 SENSITIVE Sensitive     Inducible Clindamycin NEGATIVE Sensitive     * METHICILLIN RESISTANT STAPHYLOCOCCUS AUREUS  Blood Culture ID Panel (Reflexed)     Status: Abnormal   Collection Time: 03/06/2020  5:55 PM  Result Value Ref Range Status   Enterococcus faecalis NOT DETECTED NOT DETECTED Final   Enterococcus Faecium NOT DETECTED NOT DETECTED Final   Listeria monocytogenes NOT DETECTED NOT DETECTED Final   Staphylococcus species DETECTED (A) NOT DETECTED Final    Comment: CRITICAL RESULT CALLED TO, READ BACK BY AND VERIFIED WITH:  ALEX CHAPPELL AT 1017 03/10/20 SDR    Staphylococcus aureus (BCID) DETECTED (A) NOT DETECTED Final    Comment: Methicillin (oxacillin)-resistant Staphylococcus aureus (MRSA). MRSA is predictably resistant to beta-lactam antibiotics (except ceftaroline). Preferred therapy is vancomycin unless clinically contraindicated. Patient requires contact precautions  if  hospitalized. CRITICAL RESULT CALLED TO, READ BACK BY AND VERIFIED WITH:  ALEX CHAPPELL AT 1017 03/10/20 SDR    Staphylococcus epidermidis NOT DETECTED NOT DETECTED Final   Staphylococcus lugdunensis NOT DETECTED NOT DETECTED Final   Streptococcus species NOT DETECTED NOT DETECTED Final   Streptococcus agalactiae NOT DETECTED NOT DETECTED Final   Streptococcus pneumoniae NOT DETECTED NOT DETECTED Final   Streptococcus pyogenes NOT DETECTED NOT DETECTED Final   A.calcoaceticus-baumannii NOT DETECTED NOT DETECTED Final   Bacteroides fragilis NOT DETECTED NOT DETECTED Final   Enterobacterales NOT DETECTED NOT DETECTED Final   Enterobacter cloacae complex NOT DETECTED NOT DETECTED Final   Escherichia coli NOT DETECTED NOT DETECTED Final   Klebsiella aerogenes NOT DETECTED NOT DETECTED Final  Klebsiella oxytoca NOT DETECTED NOT DETECTED Final   Klebsiella pneumoniae NOT DETECTED NOT DETECTED Final   Proteus species NOT DETECTED NOT DETECTED Final   Salmonella species NOT DETECTED NOT DETECTED Final   Serratia marcescens NOT DETECTED NOT DETECTED Final   Haemophilus influenzae NOT DETECTED NOT DETECTED Final   Neisseria meningitidis NOT DETECTED NOT DETECTED Final   Pseudomonas aeruginosa NOT DETECTED NOT DETECTED Final   Stenotrophomonas maltophilia NOT DETECTED NOT DETECTED Final   Candida albicans NOT DETECTED NOT DETECTED Final   Candida auris NOT DETECTED NOT DETECTED Final   Candida glabrata NOT DETECTED NOT DETECTED Final   Candida krusei NOT DETECTED NOT DETECTED Final   Candida parapsilosis NOT DETECTED NOT DETECTED Final   Candida tropicalis NOT DETECTED NOT DETECTED Final   Cryptococcus neoformans/gattii NOT DETECTED NOT DETECTED Final   Meth resistant mecA/C and MREJ DETECTED (A) NOT DETECTED Final    Comment: CRITICAL RESULT CALLED TO, READ BACK BY AND VERIFIED WITH:  ALEX CHAPPELL AT 5809 03/10/20 SDR Performed at [facility], [address]  [place], [place] [address]   Respiratory Panel by RT PCR (Flu A&B, Covid) - Nasopharyngeal Swab     Status: None   Collection Time: 03/03/2020  6:21 PM   Specimen: Nasopharyngeal Swab  Result Value Ref Range Status   SARS Coronavirus 2 by RT PCR NEGATIVE NEGATIVE Final    Comment: (NOTE) SARS-CoV-2 target nucleic acids are NOT DETECTED.  The SARS-CoV-2 RNA is generally detectable in upper respiratoy specimens during the acute phase of infection. The lowest concentration of SARS-CoV-2 viral copies this assay can detect is 131 copies/mL. A negative result does not preclude SARS-Cov-2 infection and should not be used as the sole basis for treatment or other patient management decisions. A negative result may occur with  improper specimen collection/handling, submission of specimen other than nasopharyngeal swab, presence of viral mutation(s) within the areas targeted by this assay, and inadequate number of viral copies (<131 copies/mL). A negative result must be combined with clinical observations, patient history, and epidemiological information. The expected result is Negative.  Fact Sheet for Patients:  PinkCheek.be  Fact Sheet for Healthcare Providers:  GravelBags.it  This test is no t yet approved or cleared by the [place] FDA and  has been authorized for detection and/or diagnosis of SARS-CoV-2 by FDA under an Emergency Use Authorization (EUA). This EUA will remain  in effect (meaning this test can be used) for the duration of the COVID-19 declaration under Section 564(b)(1) of the Act, 21 U.S.C. section 360bbb-3(b)(1), unless the authorization is terminated or revoked sooner.     Influenza A by PCR NEGATIVE NEGATIVE Final   Influenza B by PCR NEGATIVE NEGATIVE Final    Comment: (NOTE) The Xpert Xpress SARS-CoV-2/FLU/RSV assay is intended as an aid in  the diagnosis of influenza from Nasopharyngeal swab specimens and   should not be used as a sole basis for treatment. Nasal washings and  aspirates are unacceptable for Xpert Xpress SARS-CoV-2/FLU/RSV  testing.  Fact Sheet for Patients: PinkCheek.be  Fact Sheet for Healthcare Providers: GravelBags.it  This test is not yet approved or cleared by the [place] FDA and  has been authorized for detection and/or diagnosis of SARS-CoV-2 by  FDA under an Emergency Use Authorization (EUA). This EUA will remain  in effect (meaning this test can be used) for the duration of the  Covid-19 declaration under Section 564(b)(1) of the Act, 21  U.S.C. section 360bbb-3(b)(1), unless the authorization is  terminated or revoked. Performed at [facility], [address] [place], [place] [address]   Urine culture     Status: None   Collection Time: 03/31/2020  9:06 PM   Specimen: In/Out Cath Urine  Result Value Ref Range Status   Specimen Description   Final    IN/OUT CATH URINE Performed at [facility], [address] [place], [place] [address]    Special Requests   Final    NONE Performed at [facility], [address], [place], [place] [address]    Culture   Final    NO GROWTH Performed at [facility], [place] [address], [place], [place] [address]    Report Status 03/23/2020 FINAL  Final  Aerobic/Anaerobic Culture (surgical/deep wound)     Status: None (Preliminary result)   Collection Time: 03/10/20 10:55 AM   Specimen: Tissue; Wound  Result Value Ref Range Status   Specimen Description   Final    TISSUE Performed at [facility], [address] [place], [place] [address]    Special Requests   Final    LEFT PARTIAL FIRST RAY AMPUTATION Performed at [facility], [place], [place] [address]    Gram Stain   Final    RARE WBC PRESENT, PREDOMINANTLY PMN RARE GRAM POSITIVE COCCI Performed at [facility],  [place] [address] [place], [place] [address]    Culture   Final    FEW METHICILLIN RESISTANT STAPHYLOCOCCUS AUREUS NO ANAEROBES ISOLATED; CULTURE IN PROGRESS FOR 5 DAYS    Report Status PENDING  Incomplete   Organism ID, Bacteria METHICILLIN RESISTANT STAPHYLOCOCCUS AUREUS  Final      Susceptibility   Methicillin resistant staphylococcus aureus - MIC*    CIPROFLOXACIN >=8 RESISTANT Resistant     ERYTHROMYCIN >=8 RESISTANT Resistant     GENTAMICIN <=0.5 SENSITIVE Sensitive     OXACILLIN >=4 RESISTANT Resistant     TETRACYCLINE <=1 SENSITIVE Sensitive     VANCOMYCIN <=0.5 SENSITIVE Sensitive     TRIMETH/SULFA <=10 SENSITIVE Sensitive     CLINDAMYCIN >=8 RESISTANT Resistant     RIFAMPIN <=0.5 SENSITIVE Sensitive     Inducible Clindamycin NEGATIVE Sensitive     * FEW METHICILLIN RESISTANT STAPHYLOCOCCUS AUREUS  MRSA PCR Screening     Status: Abnormal   Collection Time: 03/10/20  4:21 PM   Specimen: Nasopharyngeal  Result Value Ref Range Status   MRSA by PCR POSITIVE (A) NEGATIVE Final    Comment:        The GeneXpert MRSA Assay (FDA approved for NASAL specimens only), is one component of a comprehensive MRSA colonization surveillance program. It is not intended to diagnose MRSA infection nor to guide or monitor treatment for MRSA infections. RESULT CALLED TO, READ BACK BY AND VERIFIED WITH:  [facility] HARRIS AT 1729 03/10/20 SDR Performed at [facility], [address] [place], [place] [address]   CULTURE, BLOOD (ROUTINE X 2) w Reflex to ID Panel     Status: None (Preliminary result)   Collection Time: 03/21/2020  2:50 PM   Specimen: BLOOD  Result Value Ref Range Status   Specimen Description BLOOD BLOOD RIGHT HAND  Final   Special Requests   Final    BOTTLES DRAWN AEROBIC AND ANAEROBIC Blood Culture adequate volume   Culture   Final    NO GROWTH 4 DAYS Performed at [facility], [address], [place], [place] [address]    Report Status  PENDING  Incomplete   CULTURE, BLOOD (ROUTINE X 2) w Reflex to ID Panel     Status: None (Preliminary result)   Collection Time: 04/01/2020  2:59 PM   Specimen: BLOOD  Result Value Ref Range Status   Specimen Description BLOOD BLOOD RIGHT HAND  Final   Special Requests   Final    BOTTLES DRAWN AEROBIC AND ANAEROBIC Blood Culture adequate volume   Culture   Final    NO GROWTH 4 DAYS Performed at [facility], [address] [place], [place] [address]    Report Status PENDING  Incomplete   Studies/Results: No results found. Medications:  Scheduled Meds: . Chlorhexidine Gluconate Cloth  6 each Topical Daily  . sodium chloride flush  3 mL Intravenous Q12H   Continuous Infusions: PRN Meds:.acetaminophen **OR** acetaminophen, antiseptic oral rinse, glycopyrrolate **OR** glycopyrrolate **OR** glycopyrrolate, haloperidol **OR** haloperidol **OR** haloperidol lactate, LORazepam **OR** LORazepam **OR** LORazepam, morphine injection, ondansetron **OR** ondansetron (ZOFRAN) IV, polyvinyl alcohol, sodium chloride flush   Assessment: Principal Problem:   Acute metabolic encephalopathy Active Problems:   Atrial fibrillation (HCC)   CAD (coronary artery disease)   DM (diabetes mellitus), type 2, uncontrolled with complications (HCC)   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 [facility])   Essential hypertension   History of stroke with current residual effects   Acute renal failure superimposed on stage 3a chronic kidney disease ([place])   Subacute osteomyelitis of left foot (HCC)   Aspiration pneumonia (HCC)   Chronic systolic congestive heart failure (HCC)   Hyperammonemia (HCC)   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  I had ordered HSV work-up to, and I do not see this resulted  GI service will sign off at this time, please page with any questions or concerns   LOS: 6 days   Jason Antigua, MD 03/30/2020, 11:52 AM

## 2020-03-15 NOTE — Progress Notes (Signed)
T/C to [employer] regarding the pts ICD per MD order. [facility] does not have a patient by that name.

## 2020-03-15 NOTE — Progress Notes (Signed)
Pt daughter brought in the ICD card. The company name is [employer]([phone]). T/C to [employer], company will give me a call back with a ETA.

## 2020-03-16 DIAGNOSIS — G9341 Metabolic encephalopathy: Secondary | ICD-10-CM | POA: Diagnosis not present

## 2020-03-16 LAB — AEROBIC/ANAEROBIC CULTURE W GRAM STAIN (SURGICAL/DEEP WOUND)

## 2020-03-16 LAB — GLUCOSE, CAPILLARY
Glucose-Capillary: 481 mg/dL — ABNORMAL HIGH (ref 70–99)
Glucose-Capillary: 545 mg/dL (ref 70–99)

## 2020-03-16 LAB — CULTURE, BLOOD (ROUTINE X 2)
Culture: NO GROWTH
Culture: NO GROWTH
Special Requests: ADEQUATE
Special Requests: ADEQUATE

## 2020-03-16 LAB — HEPATITIS C VRS RNA DETECT BY PCR-QUAL: Hepatitis C Vrs RNA by PCR-Qual: NEGATIVE

## 2020-03-16 LAB — HSV(HERPES SIMPLEX VRS) I + II AB-IGM: HSVI/II Comb IgM: <0.91 Ratio (ref 0.00–0.90)

## 2020-03-16 MED ORDER — INSULIN ASPART 100 UNIT/ML ~~LOC~~ SOLN
10.0000 [IU] | Freq: Once | SUBCUTANEOUS | Status: AC
  Administered 2020-03-17: 10 [IU] via SUBCUTANEOUS
  Filled 2020-03-16: qty 1

## 2020-03-16 NOTE — Progress Notes (Signed)
[facility]) Hospital Liaison RN note:  Visited with patient in room. He appears to be resting comfortably. Spoke with daughter, Nira Conn in room as well. She has no questions or concerns.   Unfortunately[facility] not [facility] to offer today. Family and hospital care team is aware. [facility] Liaison will continue to follow for bed availability. [facility] has all information needed and will reach out if a bed becomes open over the weekend.  Thank you.  Zandra Abts, RN [facility] Liaison [phone]

## 2020-03-16 NOTE — Progress Notes (Signed)
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[facility])   Essential hypertension   History of stroke with current residual effects   Acute renal failure superimposed on stage 3a chronic kidney disease ([place])   Subacute osteomyelitis of left foot (HCC)   Aspiration pneumonia (HCC)   Chronic systolic congestive heart failure (HCC)   Hyperammonemia (HCC)   Diabetic foot ulcer associated with type 2 diabetes mellitus (HCC)   Severe sepsis with acute organ dysfunction (HCC)   Elevated troponin   Chronic anemia   Severe sepsis (HCC)   Abnormal LFTs   Atherosclerosis of native arteries of the extremities with ulceration ([place])   Comfort care With assistance from palliative care family decision to transition to comfort care 10/13. Patient appears comfortable - continue prn robinul, haldol, lorazepam, morphine - no current inpatient hospice beds available; hospice is following  Septic shock 2/2 bacteremia With acute organ dysfunction. Presented w/ fever, tachycardia, elevated WBC, elevated lactic acid & end organ dysfunction (AKI, transaminitis, & encephalopathy). Blood cultures growing MRSA, source likely known left foot osteo.  - off abx given comfort care  PVD: w/ hx of amputation at right midfoot, left MTP & chronic osteomyelitis of left foot. Seen by his podiatrist on 10/8 for necrotic wound left foot with chronic osteomyelitis.  Likely source of infection. 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 Had planned for LLE angiogram - no further eval/tx given comfort care  Transaminitis: severe & labile, etiology unclear, possibly from shock liver. LFTs slightly  downtrending. [place] abd shows no hepatobiliary abnormality identified. Hepatitis panel neg. GI consulted, appreciate recs. Further labs ordered, results pending - no further evaluation given comfort care  Thrombocytopenia: Likely 2/2 bacteremia. With petechiae, elevated pt/ptt, ldh, fibrin. S/p vitamin k 10 mg IV x2. Heme consulted - no further eval/tx  Aspiration pneumonia: continue on IV abxs. Appears resolved.  Acute metabolic encephalopathy: likely secondary to infection & psychotropics.  AKI on CKDIIIa  A. fib: w/ RVR.  Elevated troponin: likely secondary to demand ischemia. troponins stable. Echo shows EF 40-45%, mildly decreased LV which very similar to pt's echo in June 2021.   DM2: poorly controlled.   HTN  Chronic systolic CHF - ICD turned off by [employer] 10/14   Chronic anemia    DVT prophylaxis: none Code Status: DNR Family Communication: sister updated @ bedside Disposition Plan: death in hospital vs. Inpatient hospice  Status is: Inpatient  Remains inpatient appropriate because:Unsafe d/c plan   Dispo: The patient is from: home               Anticipated d/c is to: inpatient hospice              Anticipated d/c date is: 1-3 days              Patient currently is not safe to discharge home   Consultants: have signed off ID: Dr. Delaine Lame Podiatry: Dr. Luana Shu  Vascular surg: Dr. Trula Slade Hematology: Dr. Janese Banks  GI: Bonna Gains  Cardio: Dr. Neita Carp   Procedures:    Antimicrobials: s/p vancomycin   Subjective: Asleep, not arousable.  Objective: Vitals:   03/09/2020 0915 03/27/2020 1214 03/16/20 0420 03/16/20 0815  BP: Marland Kitchen)  116/57 (!) 99/53 (!) 93/44 (!) 93/54  Pulse: 87 84 82 72  Resp: 18 18  18   Temp: (!) 97.5 F (36.4 C) (!) 97.4 F (36.3 C) 97.8 F (36.6 C) (!) 97.3 F (36.3 C)  TempSrc: Axillary Axillary Oral Oral  SpO2: 100% 100% 97% 97%  Weight:      Height:        Intake/Output Summary (Last 24 hours) at 03/16/2020 1032 Last data  filed at 03/04/2020 1845 Gross per 24 hour  Intake 0 ml  Output 0 ml  Net 0 ml   Filed Weights   03/10/20 1617 03/13/20 0420 03/14/20 0400  Weight: 81.1 kg 86.4 kg 86 kg    Examination:  General exam: Asleep Respiratory system: diminished breath sounds b/l. Rales at bases Cardiovascular system: S1 &S2+. No rubs or gallops. Soft systolic murmur Gastrointestinal system: Abdomen is nondistended, soft  Central nervous system: asleep, moves all 4 extremities Extremities: left great toe amputation that is dressed. Right transmetatarsal amputation. Petechiae on LLE  Psychiatry: unable to assess    Data Reviewed: I have personally reviewed following labs and imaging studies  CBC: Recent Labs  Lab 03/10/20 0500 03/16/2020 0444 03/12/20 0338 03/13/20 0459 03/14/20 0518  WBC 27.6* 22.6* 22.9* 14.6* 12.0*  HGB 7.8* 8.2* 9.0* 8.3* 8.0*  HCT 25.6* 27.0* 31.3* 28.4* 25.9*  MCV 84.2 84.4 86.2 87.1 83.5  PLT 186 129* 75* PLATELET CLUMPS NOTED ON SMEAR, UNABLE TO ESTIMATE PLATELET CLUMPS NOTED ON SMEAR, UNABLE TO ESTIMATE   Basic Metabolic Panel: Recent Labs  Lab 03/10/20 0500 03/14/2020 0444 03/12/20 0338 03/13/20 0459 03/14/20 0518  NA 133* 135 139 139 142  K 3.7 3.8 3.9 4.2 4.2  CL 104 105 109 111 115*  CO2 17* 20* 17* 13* 13*  GLUCOSE 172* 174* 138* 218* 264*  BUN 25* 36* 49* 59* 76*  CREATININE 1.62* 1.68* 1.84* 1.87* 1.91*  CALCIUM 7.8* 7.6* 7.8* 7.9* 8.0*   GFR: Estimated Creatinine Clearance: 40.8 mL/min (A) (by C-G formula based on SCr of 1.91 mg/dL (H)). Liver Function Tests: Recent Labs  Lab 03/10/20 0500 03/06/2020 0444 03/12/20 0338 03/13/20 0459 03/14/20 0518  AST 228* 2,306* 4,704* 2,756* 1,266*  ALT 153* 1,641* 3,337* 2,825* 2,226*  ALKPHOS 90 92 104 113 125  BILITOT 1.4* 1.7* 2.0* 2.5* 2.7*  PROT 7.9 8.2* 8.1 7.8 7.6  ALBUMIN 2.5* 2.6* 2.5* 2.5* 2.4*   No results for input(s): LIPASE, AMYLASE in the last 168 hours. Recent Labs  Lab 03/03/2020 1755  03/13/20 1836  AMMONIA 50* 34   Coagulation Profile: Recent Labs  Lab 03/26/2020 1755 03/10/20 0500 03/12/20 0758 03/13/20 0459 03/14/20 0518  INR 1.8* 2.7* 5.6* 6.4* 6.6*   Cardiac Enzymes: No results for input(s): CKTOTAL, CKMB, CKMBINDEX, TROPONINI in the last 168 hours. BNP (last 3 results) No results for input(s): PROBNP in the last 8760 hours. HbA1C: No results for input(s): HGBA1C in the last 72 hours. CBG: Recent Labs  Lab 03/13/20 2043 03/13/20 2358 03/14/20 0428 03/14/20 0822 03/14/20 1159  GLUCAP 223* 233* 229* 230* 235*   Lipid Profile: No results for input(s): CHOL, HDL, LDLCALC, TRIG, CHOLHDL, LDLDIRECT in the last 72 hours. Thyroid Function Tests: No results for input(s): TSH, T4TOTAL, FREET4, T3FREE, THYROIDAB in the last 72 hours. Anemia Panel: No results for input(s): VITAMINB12, FOLATE, FERRITIN, TIBC, IRON, RETICCTPCT in the last 72 hours. Sepsis Labs: Recent Labs  Lab 03/26/2020 1755 03/28/2020 2000 03/14/2020 0444 03/12/20 0338  PROCALCITON <0.10  --  28.61 21.04  LATICACIDVEN 11.0* 2.9*  --   --     Recent Results (from the past 240 hour(s))  Aerobic/Anaerobic Culture (surgical/deep wound)     Status: None   Collection Time: 03/07/2020 12:36 PM   Specimen: Wound  Result Value Ref Range Status   Specimen Description   Final    WOUND Performed at [facility], [address] [place], [place] [address]    Special Requests   Final    NONE Performed at [facility], [address] [place], [place] [address]    Gram Stain   Final    FEW WBC PRESENT, PREDOMINANTLY MONONUCLEAR FEW GRAM POSITIVE COCCI IN PAIRS IN CLUSTERS    Culture   Final    MODERATE METHICILLIN RESISTANT STAPHYLOCOCCUS AUREUS NO ANAEROBES ISOLATED Performed at [facility], [place] [address] [place], [place] [address]    Report Status 03/03/2020 FINAL  Final   Organism ID, Bacteria METHICILLIN RESISTANT STAPHYLOCOCCUS AUREUS  Final       Susceptibility   Methicillin resistant staphylococcus aureus - MIC*    CIPROFLOXACIN >=8 RESISTANT Resistant     ERYTHROMYCIN >=8 RESISTANT Resistant     GENTAMICIN <=0.5 SENSITIVE Sensitive     OXACILLIN >=4 RESISTANT Resistant     TETRACYCLINE <=1 SENSITIVE Sensitive     VANCOMYCIN <=0.5 SENSITIVE Sensitive     TRIMETH/SULFA <=10 SENSITIVE Sensitive     CLINDAMYCIN >=8 RESISTANT Resistant     RIFAMPIN <=0.5 SENSITIVE Sensitive     Inducible Clindamycin NEGATIVE Sensitive     * MODERATE METHICILLIN RESISTANT STAPHYLOCOCCUS AUREUS  Blood Culture (routine x 2)     Status: Abnormal   Collection Time: 03/29/2020  5:55 PM   Specimen: BLOOD  Result Value Ref Range Status   Specimen Description   Final    BLOOD RIGHT ANTECUBITAL Performed at [facility], [address] [place], [place] [address]    Special Requests   Final    BOTTLES DRAWN AEROBIC AND ANAEROBIC Blood Culture results may not be optimal due to an excessive volume of blood received in culture bottles Performed at [facility], [address] [place], [place] [address]    Culture  Setup Time   Final    GRAM POSITIVE COCCI IN BOTH AEROBIC AND ANAEROBIC BOTTLES    Culture (A)  Final    STAPHYLOCOCCUS AUREUS SUSCEPTIBILITIES PERFORMED ON PREVIOUS CULTURE WITHIN THE LAST 5 DAYS. Performed at [facility], [place] [address] [place], [place] [address]    Report Status 03/12/2020 FINAL  Final  Blood Culture (routine x 2)     Status: Abnormal   Collection Time: 03/19/2020  5:55 PM   Specimen: BLOOD  Result Value Ref Range Status   Specimen Description   Final    BLOOD LEFT ANTECUBITAL Performed at [facility], [address] [place], [place] [address]    Special Requests   Final    BOTTLES DRAWN AEROBIC AND ANAEROBIC Blood Culture results may not be optimal due to an excessive volume of blood received in culture bottles Performed at [facility], [address],  [place], [place] [address]    Culture  Setup Time   Final    ANAEROBIC BOTTLE ONLY GRAM POSITIVE COCCI AEROBIC BOTTLE ONLY CRITICAL RESULT CALLED TO, READ BACK BY AND VERIFIED WITH: ALEX CHAPPELL AT 1017 0/9/21 SDR Performed at [facility], [place] [address] [place], [place] [address]    Culture  METHICILLIN RESISTANT STAPHYLOCOCCUS AUREUS (A)  Final   Report Status 03/12/2020 FINAL  Final   Organism ID, Bacteria METHICILLIN RESISTANT STAPHYLOCOCCUS AUREUS  Final      Susceptibility   Methicillin resistant staphylococcus aureus - MIC*    CIPROFLOXACIN >=8 RESISTANT Resistant     ERYTHROMYCIN >=8 RESISTANT Resistant     GENTAMICIN <=0.5 SENSITIVE Sensitive     OXACILLIN >=4 RESISTANT Resistant     TETRACYCLINE <=1 SENSITIVE Sensitive     VANCOMYCIN <=0.5 SENSITIVE Sensitive     TRIMETH/SULFA <=10 SENSITIVE Sensitive     CLINDAMYCIN >=8 RESISTANT Resistant     RIFAMPIN <=0.5 SENSITIVE Sensitive     Inducible Clindamycin NEGATIVE Sensitive     * METHICILLIN RESISTANT STAPHYLOCOCCUS AUREUS  Blood Culture ID Panel (Reflexed)     Status: Abnormal   Collection Time: 03/21/2020  5:55 PM  Result Value Ref Range Status   Enterococcus faecalis NOT DETECTED NOT DETECTED Final   Enterococcus Faecium NOT DETECTED NOT DETECTED Final   Listeria monocytogenes NOT DETECTED NOT DETECTED Final   Staphylococcus species DETECTED (A) NOT DETECTED Final    Comment: CRITICAL RESULT CALLED TO, READ BACK BY AND VERIFIED WITH:  ALEX CHAPPELL AT 1017 03/10/20 SDR    Staphylococcus aureus (BCID) DETECTED (A) NOT DETECTED Final    Comment: Methicillin (oxacillin)-resistant Staphylococcus aureus (MRSA). MRSA is predictably resistant to beta-lactam antibiotics (except ceftaroline). Preferred therapy is vancomycin unless clinically contraindicated. Patient requires contact precautions if  hospitalized. CRITICAL RESULT CALLED TO, READ BACK BY AND VERIFIED WITH:  ALEX CHAPPELL AT 1017 03/10/20 SDR    Staphylococcus  epidermidis NOT DETECTED NOT DETECTED Final   Staphylococcus lugdunensis NOT DETECTED NOT DETECTED Final   Streptococcus species NOT DETECTED NOT DETECTED Final   Streptococcus agalactiae NOT DETECTED NOT DETECTED Final   Streptococcus pneumoniae NOT DETECTED NOT DETECTED Final   Streptococcus pyogenes NOT DETECTED NOT DETECTED Final   A.calcoaceticus-baumannii NOT DETECTED NOT DETECTED Final   Bacteroides fragilis NOT DETECTED NOT DETECTED Final   Enterobacterales NOT DETECTED NOT DETECTED Final   Enterobacter cloacae complex NOT DETECTED NOT DETECTED Final   Escherichia coli NOT DETECTED NOT DETECTED Final   Klebsiella aerogenes NOT DETECTED NOT DETECTED Final   Klebsiella oxytoca NOT DETECTED NOT DETECTED Final   Klebsiella pneumoniae NOT DETECTED NOT DETECTED Final   Proteus species NOT DETECTED NOT DETECTED Final   Salmonella species NOT DETECTED NOT DETECTED Final   Serratia marcescens NOT DETECTED NOT DETECTED Final   Haemophilus influenzae NOT DETECTED NOT DETECTED Final   Neisseria meningitidis NOT DETECTED NOT DETECTED Final   Pseudomonas aeruginosa NOT DETECTED NOT DETECTED Final   Stenotrophomonas maltophilia NOT DETECTED NOT DETECTED Final   Candida albicans NOT DETECTED NOT DETECTED Final   Candida auris NOT DETECTED NOT DETECTED Final   Candida glabrata NOT DETECTED NOT DETECTED Final   Candida krusei NOT DETECTED NOT DETECTED Final   Candida parapsilosis NOT DETECTED NOT DETECTED Final   Candida tropicalis NOT DETECTED NOT DETECTED Final   Cryptococcus neoformans/gattii NOT DETECTED NOT DETECTED Final   Meth resistant mecA/C and MREJ DETECTED (A) NOT DETECTED Final    Comment: CRITICAL RESULT CALLED TO, READ BACK BY AND VERIFIED WITH:  ALEX CHAPPELL AT 2330 03/10/20 SDR Performed at [facility], [address] [place], [place] [address]   Respiratory Panel by RT PCR (Flu A&B, Covid) - Nasopharyngeal Swab     Status: None   Collection Time: 03/22/2020  6:21  PM   Specimen:  Nasopharyngeal Swab  Result Value Ref Range Status   SARS Coronavirus 2 by RT PCR NEGATIVE NEGATIVE Final    Comment: (NOTE) SARS-CoV-2 target nucleic acids are NOT DETECTED.  The SARS-CoV-2 RNA is generally detectable in upper respiratoy specimens during the acute phase of infection. The lowest concentration of SARS-CoV-2 viral copies this assay can detect is 131 copies/mL. A negative result does not preclude SARS-Cov-2 infection and should not be used as the sole basis for treatment or other patient management decisions. A negative result may occur with  improper specimen collection/handling, submission of specimen other than nasopharyngeal swab, presence of viral mutation(s) within the areas targeted by this assay, and inadequate number of viral copies (<131 copies/mL). A negative result must be combined with clinical observations, patient history, and epidemiological information. The expected result is Negative.  Fact Sheet for Patients:  PinkCheek.be  Fact Sheet for Healthcare Providers:  GravelBags.it  This test is no t yet approved or cleared by the [place] FDA and  has been authorized for detection and/or diagnosis of SARS-CoV-2 by FDA under an Emergency Use Authorization (EUA). This EUA will remain  in effect (meaning this test can be used) for the duration of the COVID-19 declaration under Section 564(b)(1) of the Act, 21 U.S.C. section 360bbb-3(b)(1), unless the authorization is terminated or revoked sooner.     Influenza A by PCR NEGATIVE NEGATIVE Final   Influenza B by PCR NEGATIVE NEGATIVE Final    Comment: (NOTE) The Xpert Xpress SARS-CoV-2/FLU/RSV assay is intended as an aid in  the diagnosis of influenza from Nasopharyngeal swab specimens and  should not be used as a sole basis for treatment. Nasal washings and  aspirates are unacceptable for Xpert Xpress SARS-CoV-2/FLU/RSV   testing.  Fact Sheet for Patients: PinkCheek.be  Fact Sheet for Healthcare Providers: GravelBags.it  This test is not yet approved or cleared by the [place] FDA and  has been authorized for detection and/or diagnosis of SARS-CoV-2 by  FDA under an Emergency Use Authorization (EUA). This EUA will remain  in effect (meaning this test can be used) for the duration of the  Covid-19 declaration under Section 564(b)(1) of the Act, 21  U.S.C. section 360bbb-3(b)(1), unless the authorization is  terminated or revoked. Performed at [facility], [address], [place], [place] [address]   Urine culture     Status: None   Collection Time: 03/02/2020  9:06 PM   Specimen: In/Out Cath Urine  Result Value Ref Range Status   Specimen Description   Final    IN/OUT CATH URINE Performed at [facility], [address] [place], [place] [address]    Special Requests   Final    NONE Performed at [facility], [address], [place], [place] [address]    Culture   Final    NO GROWTH Performed at [facility], [place] [address], [place], [place] [address]    Report Status 03/14/2020 FINAL  Final  Aerobic/Anaerobic Culture (surgical/deep wound)     Status: None (Preliminary result)   Collection Time: 03/10/20 10:55 AM   Specimen: Tissue; Wound  Result Value Ref Range Status   Specimen Description   Final    TISSUE Performed at [facility], [address], [place], [place] [address]    Special Requests   Final    LEFT PARTIAL FIRST RAY AMPUTATION Performed at [facility], [address], [place], [place] [address]    Gram Stain  Final    RARE WBC PRESENT, PREDOMINANTLY PMN RARE GRAM POSITIVE COCCI Performed at [facility], [place] [address] [place], [place] [address]    Culture   Final    FEW METHICILLIN RESISTANT STAPHYLOCOCCUS AUREUS NO ANAEROBES ISOLATED;  CULTURE IN PROGRESS FOR 5 DAYS    Report Status PENDING  Incomplete   Organism ID, Bacteria METHICILLIN RESISTANT STAPHYLOCOCCUS AUREUS  Final      Susceptibility   Methicillin resistant staphylococcus aureus - MIC*    CIPROFLOXACIN >=8 RESISTANT Resistant     ERYTHROMYCIN >=8 RESISTANT Resistant     GENTAMICIN <=0.5 SENSITIVE Sensitive     OXACILLIN >=4 RESISTANT Resistant     TETRACYCLINE <=1 SENSITIVE Sensitive     VANCOMYCIN <=0.5 SENSITIVE Sensitive     TRIMETH/SULFA <=10 SENSITIVE Sensitive     CLINDAMYCIN >=8 RESISTANT Resistant     RIFAMPIN <=0.5 SENSITIVE Sensitive     Inducible Clindamycin NEGATIVE Sensitive     * FEW METHICILLIN RESISTANT STAPHYLOCOCCUS AUREUS  MRSA PCR Screening     Status: Abnormal   Collection Time: 03/10/20  4:21 PM   Specimen: Nasopharyngeal  Result Value Ref Range Status   MRSA by PCR POSITIVE (A) NEGATIVE Final    Comment:        The GeneXpert MRSA Assay (FDA approved for NASAL specimens only), is one component of a comprehensive MRSA colonization surveillance program. It is not intended to diagnose MRSA infection nor to guide or monitor treatment for MRSA infections. RESULT CALLED TO, READ BACK BY AND VERIFIED WITH:  [facility] HARRIS AT 5701 03/10/20 SDR Performed at [facility], [address] [place], [place] [address]   CULTURE, BLOOD (ROUTINE X 2) w Reflex to ID Panel     Status: None   Collection Time: 03/16/2020  2:50 PM   Specimen: BLOOD  Result Value Ref Range Status   Specimen Description BLOOD BLOOD RIGHT HAND  Final   Special Requests   Final    BOTTLES DRAWN AEROBIC AND ANAEROBIC Blood Culture adequate volume   Culture   Final    NO GROWTH 5 DAYS Performed at [facility], [address] [place], [place] [address]    Report Status 03/16/2020 FINAL  Final  CULTURE, BLOOD (ROUTINE X 2) w Reflex to ID Panel     Status: None   Collection Time: 03/20/2020  2:59 PM   Specimen: BLOOD  Result Value Ref Range  Status   Specimen Description BLOOD BLOOD RIGHT HAND  Final   Special Requests   Final    BOTTLES DRAWN AEROBIC AND ANAEROBIC Blood Culture adequate volume   Culture   Final    NO GROWTH 5 DAYS Performed at [facility], [address] [place], [place] [address]    Report Status 03/16/2020 FINAL  Final         Radiology Studies: No results found.      Scheduled Meds: . Chlorhexidine Gluconate Cloth  6 each Topical Daily  . sodium chloride flush  3 mL Intravenous Q12H   Continuous Infusions:    LOS: 7 days    Time spent: 20 mins     Desma Maxim, MD [facility]  If 7PM-7AM, please contact night-coverage [email or web address] 03/16/2020, 10:32 AM

## 2020-03-16 NOTE — Discharge Instructions (Signed)
Podiatry discharge instructions: Keep surgical dressings clean, dry, and intact for 1 week. Then recommend applying Betadine to the incision line and posterior heel wound on the left side followed by 4 x 4 gauze, ABD, Kerlix, Ace wrap. Sutures can be removed at 2 to 3 weeks postop if incision line appears to be healed.

## 2020-03-17 DIAGNOSIS — G9341 Metabolic encephalopathy: Secondary | ICD-10-CM | POA: Diagnosis not present

## 2020-03-21 LAB — BLOOD GAS, VENOUS
Acid-base deficit: 10 mmol/L — ABNORMAL HIGH (ref 0.0–2.0)
Bicarbonate: 15.1 mmol/L — ABNORMAL LOW (ref 20.0–28.0)
O2 Saturation: 10.6 %
Patient temperature: 37
pCO2, Ven: 30 mmHg — ABNORMAL LOW (ref 44.0–60.0)
pH, Ven: 7.31 (ref 7.250–7.430)

## 2020-03-30 ENCOUNTER — Telehealth: Payer: Self-pay

## 2020-03-30 NOTE — Telephone Encounter (Signed)
Recd voicemail from [employer] at [facility] from [phone] states it was faxed last month still needs to be filled out her number [phone]

## 2020-03-30 NOTE — Telephone Encounter (Signed)
I filled out whenever was on my desk.  He was readmitted to the hospital since that time and I received home health orders post discharge, however I did not see the patient after second admission.  Since then, patient unfortunately has passed away.

## 2020-04-02 NOTE — Death Summary Note (Signed)
DEATH SUMMARY   Patient Details  Name: Jason Montes MRN: [id number] DOB: Apr 26, 1951  Admission/Discharge Information   Admit Date:  03/21/20  Date of Death:  03-29-20  Time of Death:  17:34  Length of Stay: 8  Referring Physician: [facility]   Reason(s) for Hospitalization  Patient is a 69 y.o. male admitted for septic shock secondary to MRSA complicated by acute delirium and acute liver failure.  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 [facility])   Essential hypertension   History of stroke with current residual effects   Acute renal failure superimposed on stage 3a chronic kidney disease ([place])   Subacute osteomyelitis of left foot (HCC)   Aspiration pneumonia (HCC)   Chronic systolic congestive heart failure (HCC)   Hyperammonemia (HCC)   Diabetic foot ulcer associated with type 2 diabetes mellitus (HCC)   Severe sepsis with acute organ dysfunction (HCC)   Elevated troponin   Chronic anemia   Severe sepsis (HCC)   Abnormal LFTs   Atherosclerosis of native arteries of the extremities with ulceration [facility])   Brief Hospital Course (including significant findings, care, treatment, and services provided and events leading to death)  Septicshock 2/2 bacteremia With acute organ dysfunction. Presented w/ fever, tachycardia, elevated WBC, elevated lactic acid & end organ dysfunction (AKI, transaminitis, & encephalopathy). Blood cultures growing MRSA, source likely known left foot osteo. Daughter reports recent significant decline in function and suffering, says father DNR and would want comfort care, thus transitioned to comfort care.   PVD: w/ hx of amputation at right  midfoot, left MTP & chronic osteomyelitis of left foot. Seen by his podiatrist on 22-Mar-2023 for necrotic wound left foot with chronic osteomyelitis.  Likely source of infection. 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 Had planned for LLE angiogram - no further eval/tx given comfort care  Transaminitis: severe & labile, etiology unclear, possibly from shock liver. LFTs slightly downtrending at time of transition to comfort care. [place] abd shows no hepatobiliary abnormality identified. Hepatitis panel neg.   Thrombocytopenia: Likely 2/2 bacteremia. With petechiae, elevated pt/ptt, ldh, fibrin. S/p vitamin k 10 mg IV x2. Heme consulted  Aspiration pneumonia: treated with IV antibiotics  Acute metabolic encephalopathy: likely secondary to infection & psychotropics.  AKI on CKDIIIa  A. fib: w/ RVR.  Elevated troponin: likely secondary to demand ischemia. troponins stable. Echo shows EF 40-45%, mildly decreased LV which very similar to pt's echo in June 2021.   DM2: poorly controlled.   HTN  Chronic systolic CHF  Chronic anemia    Pertinent Labs and Studies  Significant Diagnostic Studies CT ABDOMEN PELVIS WO CONTRAST  Result Date: 03-21-2020 CLINICAL DATA:  Abnormal LFTs, possible sepsis EXAM: CT ABDOMEN AND PELVIS WITHOUT CONTRAST TECHNIQUE: Multidetector CT imaging of the abdomen and pelvis was performed following the standard protocol without IV contrast. COMPARISON:  CT 04/08/2019 FINDINGS: Motion degraded imaging of the lower chest and upper abdomen. Imaging quality further degraded due to increased image noise from the patient's arms over the upper abdomen. Lower chest: Small bilateral effusions. Adjacent atelectatic changes are present. Some additional heterogenous opacity could reflect further atelectasis at, edema or early consolidation. Evidence of prior sternotomy and CABG. Extensive calcification of the native coronary arteries. Pacer lead  terminates at the cardiac  apex. No pericardial effusion. Hepatobiliary: No visible focal liver lesion on this unenhanced CT. Some mild perihepatic hazy stranding may be present though this is possibly accentuated by extensive motion artifact and streak in the upper abdomen. No focal liver lesion is seen. High attenuation material seen within the gallbladder lumen, possibly biliary sand versus vicarious excretion of contrast though should correlate for recent contrast enhanced studies, none available at the time of interpretation. No focal pericholecystic inflammation is seen. No biliary ductal dilatation or visible intraductal gallstones. Pancreas: Unremarkable. No pancreatic ductal dilatation or surrounding inflammatory changes. Spleen: Normal in size. No concerning splenic lesions. Adrenals/Urinary Tract: Normal adrenal glands. Extensive bilateral perinephric stranding is increased from comparison imaging. No discernible focal renal lesion. Renal vascular calcifications. No urolithiasis or hydronephrosis. Circumferential bladder wall thickening is present with some focal thickening towards the anterosuperior bladder dome, possibly reactive. Stomach/Bowel: Small hiatal hernia. Stomach mildly distended with air and ingested fluid. No significant small bowel thickening or dilatation. Diffuse pancolonic edematous mural thickening with faint hazy pericolonic stranding. No evidence of obstruction. High attenuation fecal material, could reflect ingested bismuth containing products, inspissated stool or previously administered enteric contrast media. Vascular/Lymphatic: Atherosclerotic calcifications within the abdominal aorta and branch vessels. No aneurysm or ectasia. No enlarged abdominopelvic lymph nodes. Some edematous periaortic nodes are present. No pathologically enlarged abdominopelvic lymph nodes. Reproductive: Mild prostatomegaly. Seminal vesicles are unremarkable. Other: Diffuse body wall edema. Edematous changes in the central  mesentery. Trace free fluid in the pelvis. No free air. No organized collection or abscess. Tiny fat containing right inguinal hernia. No bowel containing hernias. Musculoskeletal: Multilevel degenerative changes are present in the imaged portions of the spine. Additional moderate degenerative changes in the hips as well as fusion across the bilateral SI joints. Prior sternotomy. Prior right femoral intramedullary nail and transcervical pin. No acute osseous abnormality or suspicious osseous lesion. IMPRESSION: 1. Diffuse pancolonic edematous mural thickening with faint hazy pericolonic stranding, consistent with a nonspecific colitis of infectious or inflammatory etiology. 2. Extensive bilateral perinephric stranding is increased from comparison imaging. This is nonspecific but can be seen in the setting of ascending urinary tract infection given the circumferential bladder wall thickening as well. Recommend correlation with urinalysis. 3. Small bilateral effusions with adjacent atelectatic changes. Some additional heterogenous opacity could reflect further atelectasis, edema or early consolidation. 4. High attenuation material within the gallbladder lumen, possibly biliary sand versus vicarious excretion of contrast though no recent contrast enhanced study is seen in the patient's chart. Correlate with history. Could consider right upper quadrant ultrasound. 5. Some hazy stranding about the liver, nonspecific and possibly accentuated by motion artifact and image noise though can be seen in the setting of hepatitis, given elevated LFTs. 6. Additional features of anasarca with body wall edema. 7. Aortic Atherosclerosis (ICD10-I70.0). Electronically Signed   By: Lovena Le M.D.   On: 03/14/2020 21:44   CT Head Wo Contrast  Result Date: 03/08/2020 CLINICAL DATA:  69 year old male with altered mental status. EXAM: CT HEAD WITHOUT CONTRAST TECHNIQUE: Contiguous axial images were obtained from the base of the skull  through the vertex without intravenous contrast. COMPARISON:  Head CT dated 01/23/2020. FINDINGS: Brain: There is mild age-related atrophy and chronic microvascular ischemic changes. 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 There is no acute intracranial hemorrhage. No mass effect or midline shift. No extra-axial fluid collection. Vascular: No hyperdense vessel or unexpected calcification. Skull: Normal. Negative for fracture or  focal lesion. Sinuses/Orbits: No acute finding. Other: None IMPRESSION: 1. No acute intracranial hemorrhage. 2. Mild age-related atrophy and chronic microvascular ischemic changes. 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 Electronically Signed   By: Anner Crete M.D.   On: 03/08/2020 18:03   DG Chest Port 1 View  Result Date: 03/12/2020 CLINICAL DATA:  Tachypnea. EXAM: PORTABLE CHEST 1 VIEW COMPARISON:  March 09, 2020. FINDINGS: Stable cardiomegaly. Status post coronary bypass graft. Left-sided pacemaker is unchanged in position. No pneumothorax or pleural effusion is noted. Stable minimal to mild bibasilar opacities are noted concerning for scarring or atelectasis. Bony thorax is unremarkable. IMPRESSION: No active disease. Electronically Signed   By: Marijo Conception M.D.   On: 03/12/2020 08:41   DG Chest Port 1 View  Result Date: 03/10/2020 CLINICAL DATA:  Possible sepsis EXAM: PORTABLE CHEST 1 VIEW COMPARISON:  Radiograph 01/17/2020 FINDINGS: Mild airways thickening with some increasing interstitial and patchy opacities in lung bases and retrocardiac space. Background of chronically coarsened interstitial features. No pneumothorax or visible effusion. Pacer/AICD overlies the left chest wall. Post sternotomy and CABG changes likely with abandoned epicardial pacer leads in the upper abdomen. Chronic cardiomegaly with a calcified aorta, not significantly changed accounting for differences in  technique. The osseous structures appear diffusely demineralized which may limit detection of small or nondisplaced fractures. No acute osseous or soft tissue abnormality. Moderate to severe degenerative changes are present in the imaged spine and shoulders. IMPRESSION: 1. Mild airways thickening with some increasing interstitial and patchy opacities in the lung bases and retrocardiac space. Could reflect developing infection or edema. 2. Chronic interstitial changes. 3. Cardiomegaly similar to prior. Prior sternotomy and CABG. 4.  Aortic Atherosclerosis (ICD10-I70.0). Electronically Signed   By: Lovena Le M.D.   On: 03/08/2020 18:20   DG Foot Complete Left  Result Date: 03/12/2020 CLINICAL DATA:  Partial first ray amputation EXAM: LEFT FOOT - COMPLETE 3+ VIEW COMPARISON:  03/10/2020 FINDINGS: Interval transmetatarsal amputation of the first ray at the level of the proximal metaphysis. Remaining osseous structures appear intact and unchanged in appearance. No additional sites of bony erosion or cortical destruction. Expected postoperative changes within the soft tissues at the resection site. Advanced small vessel vascular calcifications. 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 Electronically Signed   By: Davina Poke D.O.   On: 03/12/2020 08:29   DG Foot Complete Left  Result Date: 03/10/2020 CLINICAL DATA:  [place] EXAM: LEFT FOOT - COMPLETE 3+ VIEW COMPARISON:  January 13, 2020 FINDINGS: Status post amputation of LEFT great toe. Osteopenia. There is a new erosion of the medial aspect of the first metatarsal. There is increased lucency along medial aspect of the first metatarsal. The asymmetric sclerosis of the first metatarsal head, similar comparison to prior. Increased irregularity and cortical indistinctness of the lateral hallux sesamoid. Vascular calcifications. Scattered midfoot degenerative changes. Enthesophyte of the Achilles tendon. IMPRESSION: 1. New erosion  of the medial aspect of the first metatarsal with increased lucency along the medial aspect of the first metatarsal, concerning for osteomyelitis. 2. Increased irregularity and cortical indistinctness of the lateral hallux sesamoid, suspicious for osteomyelitis. Electronically Signed   By: Valentino Saxon MD   On: 03/10/2020 14:20   VAS [place] ABI WITH/WO TBI  Result Date: 03/06/2020 LOWER EXTREMITY DOPPLER STUDY Indications: Peripheral artery disease, and S/P Angioplasty.  Vascular Interventions: 10/10/2019: Aortogram and Selective Left Lower Extremity  Angiogram. PTA of the Left ATA. PTA of the Left Peroneal                         Artery. PTA of the Left Popliteal Artery.                          01/16/2020: Aortogram and Selective Left Lower Extremity                         Angiogram including selective images of the Left ATA and                         Peroneal Artery. PTA of the Left Anterior Tibial Artery.                         PTA of the Left Peroneal Artery. Performing Technologist: Almira Coaster RVS  Examination Guidelines: A complete evaluation includes at minimum, Doppler waveform signals and systolic blood pressure reading at the level of bilateral brachial, anterior tibial, and posterior tibial arteries, when vessel segments are accessible. Bilateral testing is considered an integral part of a complete examination. Photoelectric Plethysmograph (PPG) waveforms and toe systolic pressure readings are included as required and additional duplex testing as needed. Limited examinations for reoccurring indications may be performed as noted.  ABI Findings: +---------+------------------+-----+---------+-------------------+ Right    Rt Pressure (mmHg)IndexWaveform Comment             +---------+------------------+-----+---------+-------------------+ Brachial 141                                                  +---------+------------------+-----+---------+-------------------+ ATA      250               1.77 triphasic                    +---------+------------------+-----+---------+-------------------+ PTA      250               1.77 triphasic                    +---------+------------------+-----+---------+-------------------+ Great Toe                                Amputated Half Foot +---------+------------------+-----+---------+-------------------+ +---------+------------------+-----+---------+-------------------------------+ Left     Lt Pressure (mmHg)IndexWaveform Comment                         +---------+------------------+-----+---------+-------------------------------+ ATA      250               1.77 triphasic                                +---------+------------------+-----+---------+-------------------------------+ PTA      250               1.77 triphasic                                +---------+------------------+-----+---------+-------------------------------+ Great Toe  Great Toe and 2nd Toe Amputated +---------+------------------+-----+---------+-------------------------------+ +-------+-----------+-----------+------------+------------+ ABI/TBIToday's ABIToday's TBIPrevious ABIPrevious TBI +-------+-----------+-----------+------------+------------+ Right  >1.0 [place]                                        +-------+-----------+-----------+------------+------------+ Left   >1.0 [place]                                        +-------+-----------+-----------+------------+------------+  Summary: Right: Resting right ankle-brachial index indicates noncompressible right lower extremity arteries. Left: Resting left ankle-brachial index indicates noncompressible left lower extremity arteries.  *See table(s) above for measurements and observations.  Electronically signed by Leotis Pain MD on 03/06/2020 at 11:40:38 AM.    Final     ECHOCARDIOGRAM COMPLETE  Result Date: 03/10/2020    ECHOCARDIOGRAM REPORT   Patient Name:   Jason Montes Date of Exam: 03/10/2020 Medical Rec #:  [id number]           Height:       69.0 in Accession #:    [id number]          Weight:       175.0 lb Date of Birth:  Jul 20, 1950           BSA:          1.952 m Patient Age:    58 years            BP:           81/60 mmHg Patient Gender: M                   HR:           85 bpm. Exam Location:  [facility] Procedure: 2D Echo, Cardiac Doppler and Color Doppler Indications:     Abnormal ECG 794.31  History:         Patient has prior history of Echocardiogram examinations, most                  recent 11/15/2019. Stroke; Risk Factors:Hypertension. PVD.  Sonographer:     Sherrie Sport RDCS (AE) Referring Phys:  [id number] Wyvonnia Dusky Diagnosing Phys: Ida Rogue MD  Sonographer Comments: Image acquisition challenging due to uncooperative patient. IMPRESSIONS  1. Limited study secondary to patient terminating study prematurely.  2. Left ventricular ejection fraction could not be accurately estimated, grossly 40 to 45%. The left ventricle has mildly decreased function. Unable to exclude anteroseptal hypokinesis.  3. Right ventricle is not well visualized. Right ventricular systolic function is grossly mildly reduced. The right ventricular size is normal.  4. Left atrial size was mildly dilated.  5. The mitral valve is normal in structure. Mild mitral valve regurgitation.  6. The aortic valve was not well visualized. Aortic valve regurgitation is not visualized. FINDINGS  Left Ventricle: Left ventricular ejection fraction, by estimation, is 40 to 45%. The left ventricle has mildly decreased function. The left ventricular internal cavity size was normal in size. There is no left ventricular hypertrophy. Left ventricular diastolic parameters are indeterminate. Right Ventricle: The right ventricular size is normal. No increase in right ventricular wall thickness. Right  ventricular systolic function is mildly reduced. Left Atrium: Left atrial size was mildly dilated. Right Atrium: Right atrial size was normal in size. Pericardium: The pericardium was not well visualized. Mitral Valve:  The mitral valve is normal in structure. Mild mitral valve regurgitation. Tricuspid Valve: The tricuspid valve is not well visualized. Tricuspid valve regurgitation is not demonstrated. Aortic Valve: The aortic valve was not well visualized. Aortic valve regurgitation is not visualized. Pulmonic Valve: The pulmonic valve was not well visualized. Pulmonic valve regurgitation is not visualized. Aorta: The aortic root was not well visualized and the ascending aorta was not well visualized. IAS/Shunts: The interatrial septum was not assessed. Additional Comments: A pacer wire is visualized.  LEFT VENTRICLE PLAX 2D LVIDd:         4.94 cm LVIDs:         4.65 cm LV PW:         1.06 cm LV IVS:        1.03 cm LVOT diam:     2.00 cm LVOT Area:     3.14 cm  LEFT ATRIUM         Index LA diam:    4.60 cm 2.36 cm/m   AORTA Ao Root diam: 3.40 cm  SHUNTS Systemic Diam: 2.00 cm Ida Rogue MD Electronically signed by Ida Rogue MD Signature Date/Time: 03/10/2020/4:03:17 PM    Final    US Abdomen Limited RUQ  Result Date: 03/18/2020 CLINICAL DATA:  Elevated liver enzymes. EXAM: ULTRASOUND ABDOMEN LIMITED RIGHT UPPER QUADRANT COMPARISON:  None. FINDINGS: Gallbladder: No gallstones or wall thickening visualized. No sonographic Murphy sign noted by sonographer. Common bile duct: Diameter: 3 mm, within normal limits. Liver: No focal lesion identified. Within normal limits in parenchymal echogenicity. Portal vein is patent on color Doppler imaging with normal direction of blood flow towards the liver. Other: Right pleural effusion incidentally noted. IMPRESSION: No hepatobiliary abnormality identified. Right pleural effusion incidentally noted. Electronically Signed   By: Marlaine Hind M.D.   On: 03/23/2020  19:31    Microbiology Recent Results (from the past 240 hour(s))  Aerobic/Anaerobic Culture (surgical/deep wound)     Status: None   Collection Time: 03/26/2020 12:36 PM   Specimen: Wound  Result Value Ref Range Status   Specimen Description   Final    WOUND Performed at [facility], [address], [place], [place] [address]    Special Requests   Final    NONE Performed at [facility], [address] [place], [place] [address]    Gram Stain   Final    FEW WBC PRESENT, PREDOMINANTLY MONONUCLEAR FEW GRAM POSITIVE COCCI IN PAIRS IN CLUSTERS    Culture   Final    MODERATE METHICILLIN RESISTANT STAPHYLOCOCCUS AUREUS NO ANAEROBES ISOLATED Performed at [facility], [address] [place], [place] [address]    Report Status 03/24/2020 FINAL  Final   Organism ID, Bacteria METHICILLIN RESISTANT STAPHYLOCOCCUS AUREUS  Final      Susceptibility   Methicillin resistant staphylococcus aureus - MIC*    CIPROFLOXACIN >=8 RESISTANT Resistant     ERYTHROMYCIN >=8 RESISTANT Resistant     GENTAMICIN <=0.5 SENSITIVE Sensitive     OXACILLIN >=4 RESISTANT Resistant     TETRACYCLINE <=1 SENSITIVE Sensitive     VANCOMYCIN <=0.5 SENSITIVE Sensitive     TRIMETH/SULFA <=10 SENSITIVE Sensitive     CLINDAMYCIN >=8 RESISTANT Resistant     RIFAMPIN <=0.5 SENSITIVE Sensitive     Inducible Clindamycin NEGATIVE Sensitive     * MODERATE METHICILLIN RESISTANT STAPHYLOCOCCUS AUREUS  Blood Culture (routine x 2)     Status: Abnormal   Collection Time: 04/01/2020  5:55 PM  Specimen: BLOOD  Result Value Ref Range Status   Specimen Description   Final    BLOOD RIGHT ANTECUBITAL Performed at [facility], [address] [place], [place] [address]    Special Requests   Final    BOTTLES DRAWN AEROBIC AND ANAEROBIC Blood Culture results may not be optimal due to an excessive volume of blood received in culture bottles Performed at [facility], [address], [place], [place] [address]    Culture  Setup Time   Final    GRAM POSITIVE COCCI IN BOTH AEROBIC AND ANAEROBIC BOTTLES    Culture (A)  Final    STAPHYLOCOCCUS AUREUS SUSCEPTIBILITIES PERFORMED ON PREVIOUS CULTURE WITHIN THE LAST 5 DAYS. Performed at [facility], [place] [address] [place], [place] [address]    Report Status 03/12/2020 FINAL  Final  Blood Culture (routine x 2)     Status: Abnormal   Collection Time: 03/04/2020  5:55 PM   Specimen: BLOOD  Result Value Ref Range Status   Specimen Description   Final    BLOOD LEFT ANTECUBITAL Performed at [facility], [address] [place], [place] [address]    Special Requests   Final    BOTTLES DRAWN AEROBIC AND ANAEROBIC Blood Culture results may not be optimal due to an excessive volume of blood received in culture bottles Performed at [facility], [address], [place], [place] [address]    Culture  Setup Time   Final    ANAEROBIC BOTTLE ONLY GRAM POSITIVE COCCI AEROBIC BOTTLE ONLY CRITICAL RESULT CALLED TO, READ BACK BY AND VERIFIED WITH: ALEX CHAPPELL AT 1017 0/9/21 SDR Performed at [facility], [place] [address] [place],  [address]    Culture METHICILLIN RESISTANT STAPHYLOCOCCUS AUREUS (A)  Final   Report Status 03/12/2020 FINAL  Final   Organism ID, Bacteria METHICILLIN RESISTANT STAPHYLOCOCCUS AUREUS  Final      Susceptibility   Methicillin resistant staphylococcus aureus - MIC*    CIPROFLOXACIN >=8 RESISTANT Resistant     ERYTHROMYCIN >=8 RESISTANT Resistant     GENTAMICIN <=0.5 SENSITIVE Sensitive     OXACILLIN >=4 RESISTANT Resistant     TETRACYCLINE <=1 SENSITIVE Sensitive     VANCOMYCIN <=0.5 SENSITIVE Sensitive     TRIMETH/SULFA <=10 SENSITIVE Sensitive     CLINDAMYCIN >=8 RESISTANT Resistant     RIFAMPIN <=0.5 SENSITIVE Sensitive     Inducible Clindamycin NEGATIVE Sensitive     * METHICILLIN RESISTANT STAPHYLOCOCCUS AUREUS  Blood Culture ID Panel (Reflexed)      Status: Abnormal   Collection Time: 03/30/2020  5:55 PM  Result Value Ref Range Status   Enterococcus faecalis NOT DETECTED NOT DETECTED Final   Enterococcus Faecium NOT DETECTED NOT DETECTED Final   Listeria monocytogenes NOT DETECTED NOT DETECTED Final   Staphylococcus species DETECTED (A) NOT DETECTED Final    Comment: CRITICAL RESULT CALLED TO, READ BACK BY AND VERIFIED WITH:  ALEX CHAPPELL AT 1017 03/10/20 SDR    Staphylococcus aureus (BCID) DETECTED (A) NOT DETECTED Final    Comment: Methicillin (oxacillin)-resistant Staphylococcus aureus (MRSA). MRSA is predictably resistant to beta-lactam antibiotics (except ceftaroline). Preferred therapy is vancomycin unless clinically contraindicated. Patient requires contact precautions if  hospitalized. CRITICAL RESULT CALLED TO, READ BACK BY AND VERIFIED WITH:  ALEX CHAPPELL AT 1017 03/10/20 SDR    Staphylococcus epidermidis NOT DETECTED NOT DETECTED Final   Staphylococcus lugdunensis NOT DETECTED NOT DETECTED Final   Streptococcus species NOT DETECTED NOT  DETECTED Final   Streptococcus agalactiae NOT DETECTED NOT DETECTED Final   Streptococcus pneumoniae NOT DETECTED NOT DETECTED Final   Streptococcus pyogenes NOT DETECTED NOT DETECTED Final   A.calcoaceticus-baumannii NOT DETECTED NOT DETECTED Final   Bacteroides fragilis NOT DETECTED NOT DETECTED Final   Enterobacterales NOT DETECTED NOT DETECTED Final   Enterobacter cloacae complex NOT DETECTED NOT DETECTED Final   Escherichia coli NOT DETECTED NOT DETECTED Final   Klebsiella aerogenes NOT DETECTED NOT DETECTED Final   Klebsiella oxytoca NOT DETECTED NOT DETECTED Final   Klebsiella pneumoniae NOT DETECTED NOT DETECTED Final   Proteus species NOT DETECTED NOT DETECTED Final   Salmonella species NOT DETECTED NOT DETECTED Final   Serratia marcescens NOT DETECTED NOT DETECTED Final   Haemophilus influenzae NOT DETECTED NOT DETECTED Final   Neisseria meningitidis NOT DETECTED NOT DETECTED  Final   Pseudomonas aeruginosa NOT DETECTED NOT DETECTED Final   Stenotrophomonas maltophilia NOT DETECTED NOT DETECTED Final   Candida albicans NOT DETECTED NOT DETECTED Final   Candida auris NOT DETECTED NOT DETECTED Final   Candida glabrata NOT DETECTED NOT DETECTED Final   Candida krusei NOT DETECTED NOT DETECTED Final   Candida parapsilosis NOT DETECTED NOT DETECTED Final   Candida tropicalis NOT DETECTED NOT DETECTED Final   Cryptococcus neoformans/gattii NOT DETECTED NOT DETECTED Final   Meth resistant mecA/C and MREJ DETECTED (A) NOT DETECTED Final    Comment: CRITICAL RESULT CALLED TO, READ BACK BY AND VERIFIED WITH:  ALEX CHAPPELL AT 8088 03/10/20 SDR Performed at [facility], [address] [place], [place] [address]   Respiratory Panel by RT PCR (Flu A&B, Covid) - Nasopharyngeal Swab     Status: None   Collection Time: 03/25/2020  6:21 PM   Specimen: Nasopharyngeal Swab  Result Value Ref Range Status   SARS Coronavirus 2 by RT PCR NEGATIVE NEGATIVE Final    Comment: (NOTE) SARS-CoV-2 target nucleic acids are NOT DETECTED.  The SARS-CoV-2 RNA is generally detectable in upper respiratoy specimens during the acute phase of infection. The lowest concentration of SARS-CoV-2 viral copies this assay can detect is 131 copies/mL. A negative result does not preclude SARS-Cov-2 infection and should not be used as the sole basis for treatment or other patient management decisions. A negative result may occur with  improper specimen collection/handling, submission of specimen other than nasopharyngeal swab, presence of viral mutation(s) within the areas targeted by this assay, and inadequate number of viral copies (<131 copies/mL). A negative result must be combined with clinical observations, patient history, and epidemiological information. The expected result is Negative.  Fact Sheet for Patients:  PinkCheek.be  Fact Sheet for Healthcare  Providers:  GravelBags.it  This test is no t yet approved or cleared by the [place] FDA and  has been authorized for detection and/or diagnosis of SARS-CoV-2 by FDA under an Emergency Use Authorization (EUA). This EUA will remain  in effect (meaning this test can be used) for the duration of the COVID-19 declaration under Section 564(b)(1) of the Act, 21 U.S.C. section 360bbb-3(b)(1), unless the authorization is terminated or revoked sooner.     Influenza A by PCR NEGATIVE NEGATIVE Final   Influenza B by PCR NEGATIVE NEGATIVE Final    Comment: (NOTE) The Xpert Xpress SARS-CoV-2/FLU/RSV assay is intended as an aid in  the diagnosis of influenza from Nasopharyngeal swab specimens and  should not be used as a sole basis for treatment. Nasal washings and  aspirates are unacceptable for Xpert Xpress SARS-CoV-2/FLU/RSV  testing.  Fact Sheet for Patients: PinkCheek.be  Fact Sheet for Healthcare Providers: GravelBags.it  This test is not yet approved or cleared by the [place] FDA and  has been authorized for detection and/or diagnosis of SARS-CoV-2 by  FDA under an Emergency Use Authorization (EUA). This EUA will remain  in effect (meaning this test can be used) for the duration of the  Covid-19 declaration under Section 564(b)(1) of the Act, 21  U.S.C. section 360bbb-3(b)(1), unless the authorization is  terminated or revoked. Performed at [facility], [address], [place], [place] [address]   Urine culture     Status: None   Collection Time: 04/01/2020  9:06 PM   Specimen: In/Out Cath Urine  Result Value Ref Range Status   Specimen Description   Final    IN/OUT CATH URINE Performed at [facility], [address] [place], [place] [address]    Special Requests   Final    NONE Performed at [facility], [address], [place], [place] [address]     Culture   Final    NO GROWTH Performed at [facility], [place] [address] [place], [place] [address]    Report Status 03/27/2020 FINAL  Final  Aerobic/Anaerobic Culture (surgical/deep wound)     Status: None   Collection Time: 03/10/20 10:55 AM   Specimen: Tissue; Wound  Result Value Ref Range Status   Specimen Description   Final    TISSUE Performed at [facility], [address] [place], [place] [address]    Special Requests   Final    LEFT PARTIAL FIRST RAY AMPUTATION Performed at [facility], [address] [place], [place] [address]    Gram Stain   Final    RARE WBC PRESENT, PREDOMINANTLY PMN RARE GRAM POSITIVE COCCI    Culture   Final    FEW METHICILLIN RESISTANT STAPHYLOCOCCUS AUREUS NO ANAEROBES ISOLATED Performed at [facility], [place] [address] [place], [place] [address]    Report Status 03/16/2020 FINAL  Final   Organism ID, Bacteria METHICILLIN RESISTANT STAPHYLOCOCCUS AUREUS  Final      Susceptibility   Methicillin resistant staphylococcus aureus - MIC*    CIPROFLOXACIN >=8 RESISTANT Resistant     ERYTHROMYCIN >=8 RESISTANT Resistant     GENTAMICIN <=0.5 SENSITIVE Sensitive     OXACILLIN >=4 RESISTANT Resistant     TETRACYCLINE <=1 SENSITIVE Sensitive     VANCOMYCIN <=0.5 SENSITIVE Sensitive     TRIMETH/SULFA <=10 SENSITIVE Sensitive     CLINDAMYCIN >=8 RESISTANT Resistant     RIFAMPIN <=0.5 SENSITIVE Sensitive     Inducible Clindamycin NEGATIVE Sensitive     * FEW METHICILLIN RESISTANT STAPHYLOCOCCUS AUREUS  MRSA PCR Screening     Status: Abnormal   Collection Time: 03/10/20  4:21 PM   Specimen: Nasopharyngeal  Result Value Ref Range Status   MRSA by PCR POSITIVE (A) NEGATIVE Final    Comment:        The GeneXpert MRSA Assay (FDA approved for NASAL specimens only), is one component of a comprehensive MRSA colonization surveillance program. It is not intended to diagnose MRSA infection nor to guide or monitor  treatment for MRSA infections. RESULT CALLED TO, READ BACK BY AND VERIFIED WITH:  [facility] HARRIS AT 3664 03/10/20 SDR Performed at [facility], [address] [place],  [address]   CULTURE, BLOOD (ROUTINE X 2) w Reflex to ID Panel     Status: None  Collection Time: 03/22/2020  2:50 PM   Specimen: BLOOD  Result Value Ref Range Status   Specimen Description BLOOD BLOOD RIGHT HAND  Final   Special Requests   Final    BOTTLES DRAWN AEROBIC AND ANAEROBIC Blood Culture adequate volume   Culture   Final    NO GROWTH 5 DAYS Performed at [facility], [address] [place], [place] [address]    Report Status 03/16/2020 FINAL  Final  CULTURE, BLOOD (ROUTINE X 2) w Reflex to ID Panel     Status: None   Collection Time: 03/20/2020  2:59 PM   Specimen: BLOOD  Result Value Ref Range Status   Specimen Description BLOOD BLOOD RIGHT HAND  Final   Special Requests   Final    BOTTLES DRAWN AEROBIC AND ANAEROBIC Blood Culture adequate volume   Culture   Final    NO GROWTH 5 DAYS Performed at [facility], [address] [place], [place] [address]    Report Status 03/16/2020 FINAL  Final    Lab Basic Metabolic Panel: Recent Labs  Lab 03/10/2020 0444 03/12/20 0338 03/13/20 0459 03/14/20 0518  NA 135 139 139 142  K 3.8 3.9 4.2 4.2  CL 105 109 111 115*  CO2 20* 17* 13* 13*  GLUCOSE 174* 138* 218* 264*  BUN 36* 49* 59* 76*  CREATININE 1.68* 1.84* 1.87* 1.91*  CALCIUM 7.6* 7.8* 7.9* 8.0*   Liver Function Tests: Recent Labs  Lab 03/13/2020 0444 03/12/20 0338 03/13/20 0459 03/14/20 0518  AST 2,306* 4,704* 2,756* 1,266*  ALT 1,641* 3,337* 2,825* 2,226*  ALKPHOS 92 104 113 125  BILITOT 1.7* 2.0* 2.5* 2.7*  PROT 8.2* 8.1 7.8 7.6  ALBUMIN 2.6* 2.5* 2.5* 2.4*   No results for input(s): LIPASE, AMYLASE in the last 168 hours. Recent Labs  Lab 03/13/20 1836  AMMONIA 34   CBC: Recent Labs  Lab 03/25/2020 0444 03/12/20 0338 03/13/20 0459  03/14/20 0518  WBC 22.6* 22.9* 14.6* 12.0*  HGB 8.2* 9.0* 8.3* 8.0*  HCT 27.0* 31.3* 28.4* 25.9*  MCV 84.4 86.2 87.1 83.5  PLT 129* 75* PLATELET CLUMPS NOTED ON SMEAR, UNABLE TO ESTIMATE PLATELET CLUMPS NOTED ON SMEAR, UNABLE TO ESTIMATE   Cardiac Enzymes: No results for input(s): CKTOTAL, CKMB, CKMBINDEX, TROPONINI in the last 168 hours. 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

## 2020-04-02 NOTE — Progress Notes (Signed)
Called to room by patient's family. Patient with no HR or respirations. MD made aware. Order received for RN to pronounce. Charge nurse aware.

## 2020-04-02 NOTE — Progress Notes (Signed)
Chaplain offered supportive presence to family at time of death. Family shared a life review. Chaplain offered spiritual assurance. Prayer and blessing offered. [facility] for [employer].     03/21/2020 1800  Clinical Encounter Type  Visited With Family  Visit Type Death  Spiritual Encounters  Spiritual Needs Emotional;Grief support;Brochure

## 2020-04-02 NOTE — Progress Notes (Signed)
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 [facility])   Essential hypertension   History of stroke with current residual effects   Acute renal failure superimposed on stage 3a chronic kidney disease ([place])   Subacute osteomyelitis of left foot (HCC)   Aspiration pneumonia (HCC)   Chronic systolic congestive heart failure (HCC)   Hyperammonemia (HCC)   Diabetic foot ulcer associated with type 2 diabetes mellitus (HCC)   Severe sepsis with acute organ dysfunction (HCC)   Elevated troponin   Chronic anemia   Severe sepsis (HCC)   Abnormal LFTs   Atherosclerosis of native arteries of the extremities with ulceration ([place])   Comfort care With assistance from palliative care family decision to transition to comfort care 10/13. Patient appears comfortable after prn dose of morphine this morning - continue prn robinul, haldol, lorazepam, morphine - no current inpatient hospice beds available; hospice is following  Septic shock 2/2 bacteremia With acute organ dysfunction. Presented w/ fever, tachycardia, elevated WBC, elevated lactic acid & end organ dysfunction (AKI, transaminitis, & encephalopathy). Blood cultures growing MRSA, source likely known left foot osteo.  - off abx given comfort care  PVD: w/ hx of amputation at right midfoot, left MTP & chronic osteomyelitis of left foot. Seen by his podiatrist on 10/8 for necrotic wound left foot with chronic osteomyelitis.  Likely source of infection. 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 Had planned for LLE angiogram - no further eval/tx given comfort care  Transaminitis: severe & labile, etiology unclear, possibly  from shock liver. LFTs slightly downtrending. [place] abd shows no hepatobiliary abnormality identified. Hepatitis panel neg. GI consulted, appreciate recs. Further labs ordered, results pending - no further evaluation given comfort care  Thrombocytopenia: Likely 2/2 bacteremia. With petechiae, elevated pt/ptt, ldh, fibrin. S/p vitamin k 10 mg IV x2. Heme consulted - no further eval/tx  Aspiration pneumonia: continue on IV abxs. Appears resolved.  Acute metabolic encephalopathy: likely secondary to infection & psychotropics.  AKI on CKDIIIa  A. fib: w/ RVR.  Elevated troponin: likely secondary to demand ischemia. troponins stable. Echo shows EF 40-45%, mildly decreased LV which very similar to pt's echo in June 2021.   DM2: poorly controlled.   HTN  Chronic systolic CHF - ICD turned off by [employer] 10/14   Chronic anemia    DVT prophylaxis: none Code Status: DNR Family Communication: daughter updated @ bedside Disposition Plan: death in hospital vs. Inpatient hospice  Status is: Inpatient  Remains inpatient appropriate because:Unsafe d/c plan   Dispo: The patient is from: home               Anticipated d/c is to: inpatient hospice              Anticipated d/c date is: 1-3 days              Patient currently is not safe to discharge home   Consultants: have signed off ID: Dr. Delaine Lame Podiatry: Dr. Luana Shu  Vascular surg: Dr. Trula Slade Hematology: Dr. Janese Banks  GI: Bonna Gains  Cardio: Dr. Neita Carp   Procedures:    Antimicrobials: s/p vancomycin   Subjective: Asleep, not arousable. Eyes open intermittently  Objective: Vitals:   03/16/20  0815 08-Apr-2020 0322 04/08/2020 0413 04-08-2020 0801  BP: (!) 93/54 (!) 105/49 (!) 117/49 (!) 106/56  Pulse: 72 94 92 89  Resp: 18 20  (!) 23  Temp: (!) 97.3 F (36.3 C) 98.8 F (37.1 C) 98.6 F (37 C) 98.5 F (36.9 C)  TempSrc: Oral Oral Oral Oral  SpO2: 97% 94% 94% 95%  Weight:      Height:        Intake/Output Summary  (Last 24 hours) at 04/08/2020 1410 Last data filed at 03/16/2020 2300 Gross per 24 hour  Intake --  Output 50 ml  Net -50 ml   Filed Weights   03/10/20 1617 03/13/20 0420 03/14/20 0400  Weight: 81.1 kg 86.4 kg 86 kg    Examination:  General exam: Asleep Respiratory system: diminished breath sounds b/l. Rales at bases. tachypnic Cardiovascular system: S1 &S2+. No rubs or gallops. Soft systolic murmur Gastrointestinal system: Abdomen is nondistended, soft  Central nervous system: asleep, moves all 4 extremities Extremities: left great toe amputation that is dressed. Right transmetatarsal amputation. Petechiae on LLE  Psychiatry: unable to assess    Data Reviewed: I have personally reviewed following labs and imaging studies  CBC: Recent Labs  Lab 03/26/2020 0444 03/12/20 0338 03/13/20 0459 03/14/20 0518  WBC 22.6* 22.9* 14.6* 12.0*  HGB 8.2* 9.0* 8.3* 8.0*  HCT 27.0* 31.3* 28.4* 25.9*  MCV 84.4 86.2 87.1 83.5  PLT 129* 75* PLATELET CLUMPS NOTED ON SMEAR, UNABLE TO ESTIMATE PLATELET CLUMPS NOTED ON SMEAR, UNABLE TO ESTIMATE   Basic Metabolic Panel: Recent Labs  Lab 03/04/2020 0444 03/12/20 0338 03/13/20 0459 03/14/20 0518  NA 135 139 139 142  K 3.8 3.9 4.2 4.2  CL 105 109 111 115*  CO2 20* 17* 13* 13*  GLUCOSE 174* 138* 218* 264*  BUN 36* 49* 59* 76*  CREATININE 1.68* 1.84* 1.87* 1.91*  CALCIUM 7.6* 7.8* 7.9* 8.0*   GFR: Estimated Creatinine Clearance: 40.8 mL/min (A) (by C-G formula based on SCr of 1.91 mg/dL (H)). Liver Function Tests: Recent Labs  Lab 03/27/2020 0444 03/12/20 0338 03/13/20 0459 03/14/20 0518  AST 2,306* 4,704* 2,756* 1,266*  ALT 1,641* 3,337* 2,825* 2,226*  ALKPHOS 92 104 113 125  BILITOT 1.7* 2.0* 2.5* 2.7*  PROT 8.2* 8.1 7.8 7.6  ALBUMIN 2.6* 2.5* 2.5* 2.4*   No results for input(s): LIPASE, AMYLASE in the last 168 hours. Recent Labs  Lab 03/13/20 1836  AMMONIA 34   Coagulation Profile: Recent Labs  Lab 03/12/20 0758  03/13/20 0459 03/14/20 0518  INR 5.6* 6.4* 6.6*   Cardiac Enzymes: No results for input(s): CKTOTAL, CKMB, CKMBINDEX, TROPONINI in the last 168 hours. BNP (last 3 results) No results for input(s): PROBNP in the last 8760 hours. HbA1C: No results for input(s): HGBA1C in the last 72 hours. CBG: Recent Labs  Lab 03/14/20 0428 03/14/20 0822 03/14/20 1159 03/16/20 2140 03/16/20 2211  GLUCAP 229* 230* 235* 545* 481*   Lipid Profile: No results for input(s): CHOL, HDL, LDLCALC, TRIG, CHOLHDL, LDLDIRECT in the last 72 hours. Thyroid Function Tests: No results for input(s): TSH, T4TOTAL, FREET4, T3FREE, THYROIDAB in the last 72 hours. Anemia Panel: No results for input(s): VITAMINB12, FOLATE, FERRITIN, TIBC, IRON, RETICCTPCT in the last 72 hours. Sepsis Labs: Recent Labs  Lab 03/26/2020 0444 03/12/20 0338  PROCALCITON 28.61 21.04    Recent Results (from the past 240 hour(s))  Aerobic/Anaerobic Culture (surgical/deep wound)     Status: None   Collection Time: 03/16/2020 12:36 PM   Specimen:  Wound  Result Value Ref Range Status   Specimen Description   Final    WOUND Performed at [facility], [address], [place], [place] [address]    Special Requests   Final    NONE Performed at [facility], [address] [place], [place] [address]    Gram Stain   Final    FEW WBC PRESENT, PREDOMINANTLY MONONUCLEAR FEW GRAM POSITIVE COCCI IN PAIRS IN CLUSTERS    Culture   Final    MODERATE METHICILLIN RESISTANT STAPHYLOCOCCUS AUREUS NO ANAEROBES ISOLATED Performed at [facility], [address] [place], [place] [address]    Report Status 03/04/2020 FINAL  Final   Organism ID, Bacteria METHICILLIN RESISTANT STAPHYLOCOCCUS AUREUS  Final      Susceptibility   Methicillin resistant staphylococcus aureus - MIC*    CIPROFLOXACIN >=8 RESISTANT Resistant     ERYTHROMYCIN >=8 RESISTANT Resistant     GENTAMICIN <=0.5 SENSITIVE Sensitive     OXACILLIN >=4  RESISTANT Resistant     TETRACYCLINE <=1 SENSITIVE Sensitive     VANCOMYCIN <=0.5 SENSITIVE Sensitive     TRIMETH/SULFA <=10 SENSITIVE Sensitive     CLINDAMYCIN >=8 RESISTANT Resistant     RIFAMPIN <=0.5 SENSITIVE Sensitive     Inducible Clindamycin NEGATIVE Sensitive     * MODERATE METHICILLIN RESISTANT STAPHYLOCOCCUS AUREUS  Blood Culture (routine x 2)     Status: Abnormal   Collection Time: 03/18/2020  5:55 PM   Specimen: BLOOD  Result Value Ref Range Status   Specimen Description   Final    BLOOD RIGHT ANTECUBITAL Performed at [facility], [address], [place], [place] [address]    Special Requests   Final    BOTTLES DRAWN AEROBIC AND ANAEROBIC Blood Culture results may not be optimal due to an excessive volume of blood received in culture bottles Performed at [facility], [address] [place], [place] [address]    Culture  Setup Time   Final    GRAM POSITIVE COCCI IN BOTH AEROBIC AND ANAEROBIC BOTTLES    Culture (A)  Final    STAPHYLOCOCCUS AUREUS SUSCEPTIBILITIES PERFORMED ON PREVIOUS CULTURE WITHIN THE LAST 5 DAYS. Performed at [facility], [place] [address], [place], [place] [address]    Report Status 03/12/2020 FINAL  Final  Blood Culture (routine x 2)     Status: Abnormal   Collection Time: 03/07/2020  5:55 PM   Specimen: BLOOD  Result Value Ref Range Status   Specimen Description   Final    BLOOD LEFT ANTECUBITAL Performed at [facility], [address] [place], [place] [address]    Special Requests   Final    BOTTLES DRAWN AEROBIC AND ANAEROBIC Blood Culture results may not be optimal due to an excessive volume of blood received in culture bottles Performed at [facility], [address], [place], [place] [address]    Culture  Setup Time   Final    ANAEROBIC BOTTLE ONLY GRAM POSITIVE COCCI AEROBIC BOTTLE ONLY CRITICAL RESULT CALLED TO, READ BACK BY AND VERIFIED WITH: ALEX CHAPPELL AT 1017 0/9/21  SDR Performed at [facility], [place] [address], [place], [place] [address]    Culture METHICILLIN RESISTANT STAPHYLOCOCCUS AUREUS (A)  Final   Report Status 03/12/2020 FINAL  Final   Organism ID, Bacteria METHICILLIN RESISTANT STAPHYLOCOCCUS AUREUS  Final      Susceptibility   Methicillin resistant staphylococcus aureus - MIC*  CIPROFLOXACIN >=8 RESISTANT Resistant     ERYTHROMYCIN >=8 RESISTANT Resistant     GENTAMICIN <=0.5 SENSITIVE Sensitive     OXACILLIN >=4 RESISTANT Resistant     TETRACYCLINE <=1 SENSITIVE Sensitive     VANCOMYCIN <=0.5 SENSITIVE Sensitive     TRIMETH/SULFA <=10 SENSITIVE Sensitive     CLINDAMYCIN >=8 RESISTANT Resistant     RIFAMPIN <=0.5 SENSITIVE Sensitive     Inducible Clindamycin NEGATIVE Sensitive     * METHICILLIN RESISTANT STAPHYLOCOCCUS AUREUS  Blood Culture ID Panel (Reflexed)     Status: Abnormal   Collection Time: 03/02/2020  5:55 PM  Result Value Ref Range Status   Enterococcus faecalis NOT DETECTED NOT DETECTED Final   Enterococcus Faecium NOT DETECTED NOT DETECTED Final   Listeria monocytogenes NOT DETECTED NOT DETECTED Final   Staphylococcus species DETECTED (A) NOT DETECTED Final    Comment: CRITICAL RESULT CALLED TO, READ BACK BY AND VERIFIED WITH:  ALEX CHAPPELL AT 1017 03/10/20 SDR    Staphylococcus aureus (BCID) DETECTED (A) NOT DETECTED Final    Comment: Methicillin (oxacillin)-resistant Staphylococcus aureus (MRSA). MRSA is predictably resistant to beta-lactam antibiotics (except ceftaroline). Preferred therapy is vancomycin unless clinically contraindicated. Patient requires contact precautions if  hospitalized. CRITICAL RESULT CALLED TO, READ BACK BY AND VERIFIED WITH:  ALEX CHAPPELL AT 1017 03/10/20 SDR    Staphylococcus epidermidis NOT DETECTED NOT DETECTED Final   Staphylococcus lugdunensis NOT DETECTED NOT DETECTED Final   Streptococcus species NOT DETECTED NOT DETECTED Final   Streptococcus agalactiae NOT DETECTED NOT  DETECTED Final   Streptococcus pneumoniae NOT DETECTED NOT DETECTED Final   Streptococcus pyogenes NOT DETECTED NOT DETECTED Final   A.calcoaceticus-baumannii NOT DETECTED NOT DETECTED Final   Bacteroides fragilis NOT DETECTED NOT DETECTED Final   Enterobacterales NOT DETECTED NOT DETECTED Final   Enterobacter cloacae complex NOT DETECTED NOT DETECTED Final   Escherichia coli NOT DETECTED NOT DETECTED Final   Klebsiella aerogenes NOT DETECTED NOT DETECTED Final   Klebsiella oxytoca NOT DETECTED NOT DETECTED Final   Klebsiella pneumoniae NOT DETECTED NOT DETECTED Final   Proteus species NOT DETECTED NOT DETECTED Final   Salmonella species NOT DETECTED NOT DETECTED Final   Serratia marcescens NOT DETECTED NOT DETECTED Final   Haemophilus influenzae NOT DETECTED NOT DETECTED Final   Neisseria meningitidis NOT DETECTED NOT DETECTED Final   Pseudomonas aeruginosa NOT DETECTED NOT DETECTED Final   Stenotrophomonas maltophilia NOT DETECTED NOT DETECTED Final   Candida albicans NOT DETECTED NOT DETECTED Final   Candida auris NOT DETECTED NOT DETECTED Final   Candida glabrata NOT DETECTED NOT DETECTED Final   Candida krusei NOT DETECTED NOT DETECTED Final   Candida parapsilosis NOT DETECTED NOT DETECTED Final   Candida tropicalis NOT DETECTED NOT DETECTED Final   Cryptococcus neoformans/gattii NOT DETECTED NOT DETECTED Final   Meth resistant mecA/C and MREJ DETECTED (A) NOT DETECTED Final    Comment: CRITICAL RESULT CALLED TO, READ BACK BY AND VERIFIED WITH:  ALEX CHAPPELL AT 1017 03/10/20 SDR Performed at [facility], [address] [place], [place] [address]   Respiratory Panel by RT PCR (Flu A&B, Covid) - Nasopharyngeal Swab     Status: None   Collection Time: 03/02/2020  6:21 PM   Specimen: Nasopharyngeal Swab  Result Value Ref Range Status   SARS Coronavirus 2 by RT PCR NEGATIVE NEGATIVE Final    Comment: (NOTE) SARS-CoV-2 target nucleic acids are NOT DETECTED.  The  SARS-CoV-2 RNA is generally detectable in upper respiratoy specimens during  the acute phase of infection. The lowest concentration of SARS-CoV-2 viral copies this assay can detect is 131 copies/mL. A negative result does not preclude SARS-Cov-2 infection and should not be used as the sole basis for treatment or other patient management decisions. A negative result may occur with  improper specimen collection/handling, submission of specimen other than nasopharyngeal swab, presence of viral mutation(s) within the areas targeted by this assay, and inadequate number of viral copies (<131 copies/mL). A negative result must be combined with clinical observations, patient history, and epidemiological information. The expected result is Negative.  Fact Sheet for Patients:  PinkCheek.be  Fact Sheet for Healthcare Providers:  GravelBags.it  This test is no t yet approved or cleared by the [place] FDA and  has been authorized for detection and/or diagnosis of SARS-CoV-2 by FDA under an Emergency Use Authorization (EUA). This EUA will remain  in effect (meaning this test can be used) for the duration of the COVID-19 declaration under Section 564(b)(1) of the Act, 21 U.S.C. section 360bbb-3(b)(1), unless the authorization is terminated or revoked sooner.     Influenza A by PCR NEGATIVE NEGATIVE Final   Influenza B by PCR NEGATIVE NEGATIVE Final    Comment: (NOTE) The Xpert Xpress SARS-CoV-2/FLU/RSV assay is intended as an aid in  the diagnosis of influenza from Nasopharyngeal swab specimens and  should not be used as a sole basis for treatment. Nasal washings and  aspirates are unacceptable for Xpert Xpress SARS-CoV-2/FLU/RSV  testing.  Fact Sheet for Patients: PinkCheek.be  Fact Sheet for Healthcare Providers: GravelBags.it  This test is not yet approved or cleared  by the [place] FDA and  has been authorized for detection and/or diagnosis of SARS-CoV-2 by  FDA under an Emergency Use Authorization (EUA). This EUA will remain  in effect (meaning this test can be used) for the duration of the  Covid-19 declaration under Section 564(b)(1) of the Act, 21  U.S.C. section 360bbb-3(b)(1), unless the authorization is  terminated or revoked. Performed at [facility], [address] [place], [place] [address]   Urine culture     Status: None   Collection Time: 03/27/2020  9:06 PM   Specimen: In/Out Cath Urine  Result Value Ref Range Status   Specimen Description   Final    IN/OUT CATH URINE Performed at [facility], [address] [place], [place] [address]    Special Requests   Final    NONE Performed at [facility], [address], [place], [place] [address]    Culture   Final    NO GROWTH Performed at [facility], [place] [address] [place], [place] [address]    Report Status 03/03/2020 FINAL  Final  Aerobic/Anaerobic Culture (surgical/deep wound)     Status: None   Collection Time: 03/10/20 10:55 AM   Specimen: Tissue; Wound  Result Value Ref Range Status   Specimen Description   Final    TISSUE Performed at [facility], [address], [place], [place] [address]    Special Requests   Final    LEFT PARTIAL FIRST RAY AMPUTATION Performed at [facility], [address] [place], [place] [address]    Gram Stain   Final    RARE WBC PRESENT, PREDOMINANTLY PMN RARE GRAM POSITIVE COCCI    Culture   Final    FEW METHICILLIN RESISTANT STAPHYLOCOCCUS AUREUS NO ANAEROBES ISOLATED Performed at [facility], [place] [address], [place], [place] [address]  Report Status 03/16/2020 FINAL  Final   Organism ID, Bacteria METHICILLIN RESISTANT STAPHYLOCOCCUS AUREUS  Final      Susceptibility   Methicillin resistant staphylococcus aureus - MIC*    CIPROFLOXACIN >=8 RESISTANT Resistant      ERYTHROMYCIN >=8 RESISTANT Resistant     GENTAMICIN <=0.5 SENSITIVE Sensitive     OXACILLIN >=4 RESISTANT Resistant     TETRACYCLINE <=1 SENSITIVE Sensitive     VANCOMYCIN <=0.5 SENSITIVE Sensitive     TRIMETH/SULFA <=10 SENSITIVE Sensitive     CLINDAMYCIN >=8 RESISTANT Resistant     RIFAMPIN <=0.5 SENSITIVE Sensitive     Inducible Clindamycin NEGATIVE Sensitive     * FEW METHICILLIN RESISTANT STAPHYLOCOCCUS AUREUS  MRSA PCR Screening     Status: Abnormal   Collection Time: 03/10/20  4:21 PM   Specimen: Nasopharyngeal  Result Value Ref Range Status   MRSA by PCR POSITIVE (A) NEGATIVE Final    Comment:        The GeneXpert MRSA Assay (FDA approved for NASAL specimens only), is one component of a comprehensive MRSA colonization surveillance program. It is not intended to diagnose MRSA infection nor to guide or monitor treatment for MRSA infections. RESULT CALLED TO, READ BACK BY AND VERIFIED WITH:  [facility] HARRIS AT 8295 03/10/20 SDR Performed at [facility], [address] [place], [place] [address]   CULTURE, BLOOD (ROUTINE X 2) w Reflex to ID Panel     Status: None   Collection Time: 03/19/2020  2:50 PM   Specimen: BLOOD  Result Value Ref Range Status   Specimen Description BLOOD BLOOD RIGHT HAND  Final   Special Requests   Final    BOTTLES DRAWN AEROBIC AND ANAEROBIC Blood Culture adequate volume   Culture   Final    NO GROWTH 5 DAYS Performed at [facility], [address] [place], [place] [address]    Report Status 03/16/2020 FINAL  Final  CULTURE, BLOOD (ROUTINE X 2) w Reflex to ID Panel     Status: None   Collection Time: 03/07/2020  2:59 PM   Specimen: BLOOD  Result Value Ref Range Status   Specimen Description BLOOD BLOOD RIGHT HAND  Final   Special Requests   Final    BOTTLES DRAWN AEROBIC AND ANAEROBIC Blood Culture adequate volume   Culture   Final    NO GROWTH 5 DAYS Performed at [facility], [address]  [place], [place] [address]    Report Status 03/16/2020 FINAL  Final         Radiology Studies: No results found.      Scheduled Meds:  Chlorhexidine Gluconate Cloth  6 each Topical Daily   sodium chloride flush  3 mL Intravenous Q12H   Continuous Infusions:    LOS: 8 days    Time spent: 20 mins     Desma Maxim, MD [facility]  If 7PM-7AM, please contact night-coverage [email or web address] 24-Mar-2020, 2:10 PM

## 2020-04-02 DEATH — deceased

## 2020-04-17 ENCOUNTER — Encounter (INDEPENDENT_AMBULATORY_CARE_PROVIDER_SITE_OTHER): Payer: Medicare Other

## 2020-04-17 ENCOUNTER — Ambulatory Visit (INDEPENDENT_AMBULATORY_CARE_PROVIDER_SITE_OTHER): Payer: Medicare Other | Admitting: Nurse Practitioner

## 2021-08-11 IMAGING — CT CT HEAD W/O CM
3 series · 15 of 47 positions shown, 18 images · non-contrast
Comparison: November 24, 2017.

CLINICAL DATA: Encephalopathy.

EXAM:
CT HEAD WITHOUT CONTRAST
TECHNIQUE: Contiguous axial images were obtained from the base of the skull
through the vertex without intravenous contrast.

[Series 3: head wo · axial · 0.47mm/px · z∈[-139,-4]mm · 9 of 33 slices shown, 12 images]
[im 3/33  brain]
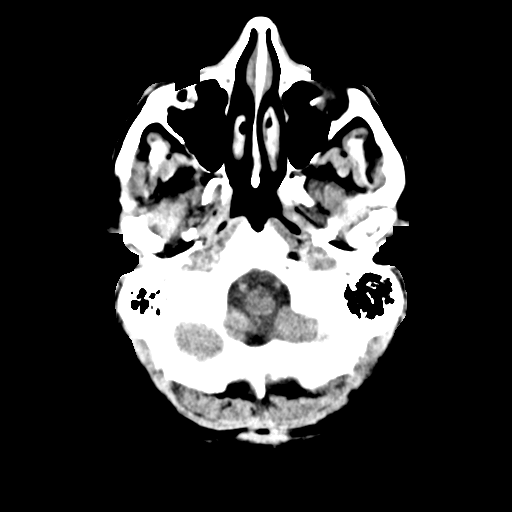
[im 3/33  bone]
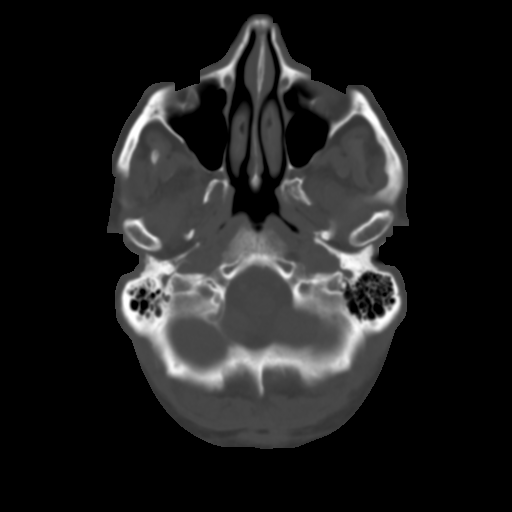
[im 6/33  brain]
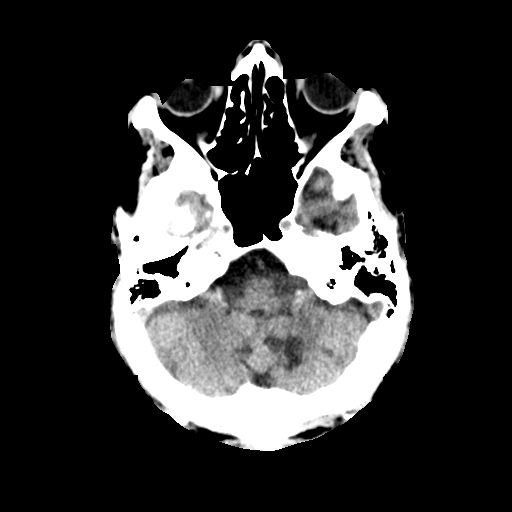
[im 9/33  brain]
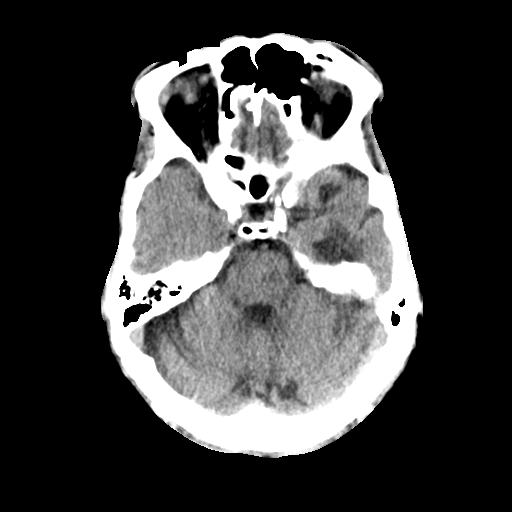
[im 13/33  brain]
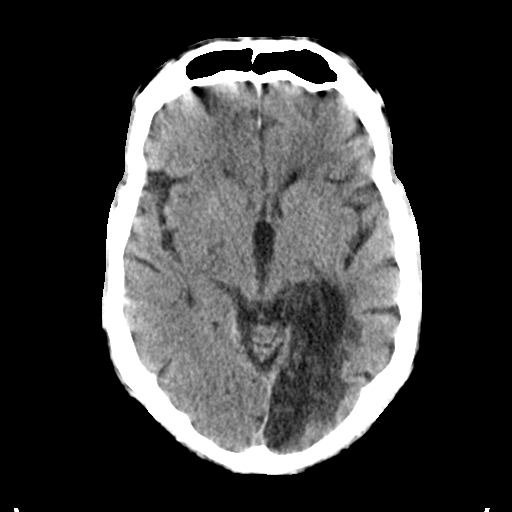
[im 17/33  brain]
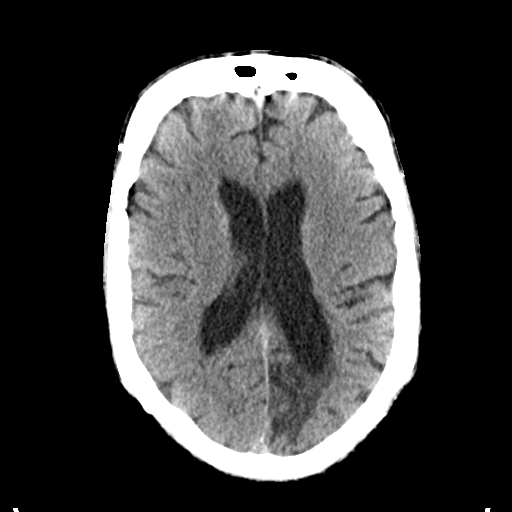
[im 17/33  bone]
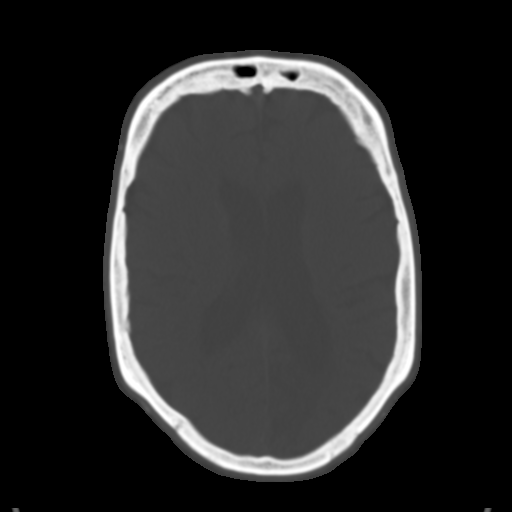
[im 20/33  brain]
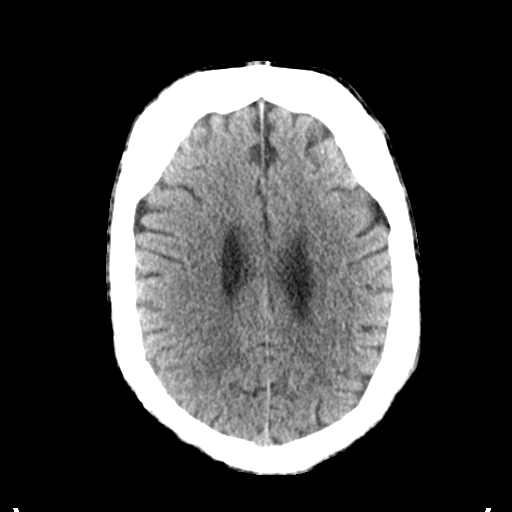
[im 24/33  brain]
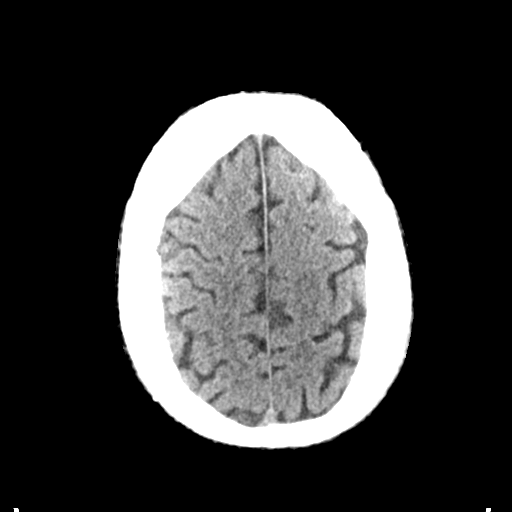
[im 27/33  brain]
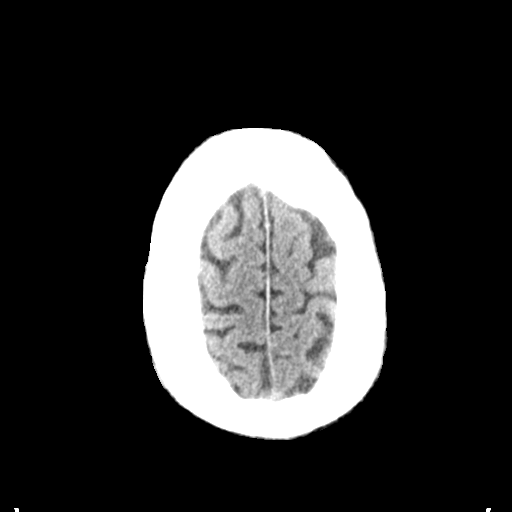
[im 30/33  brain]
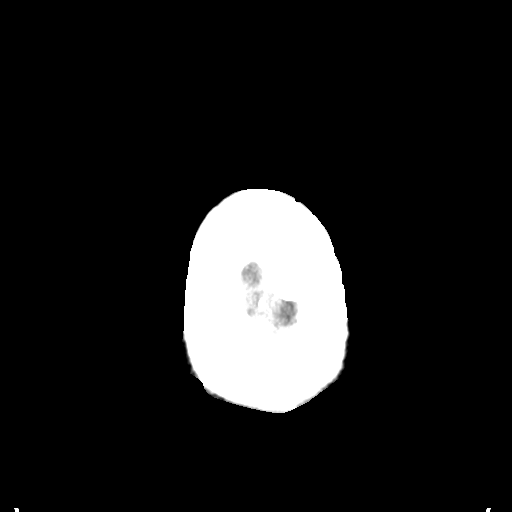
[im 30/33  bone]
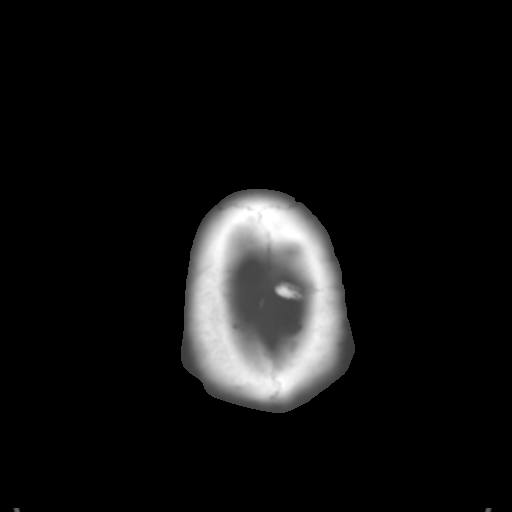

[Series 4: coronal soft tissue · coronal · 0.32mm/px · 3 of 66 slices shown]
[im 22/66  brain]
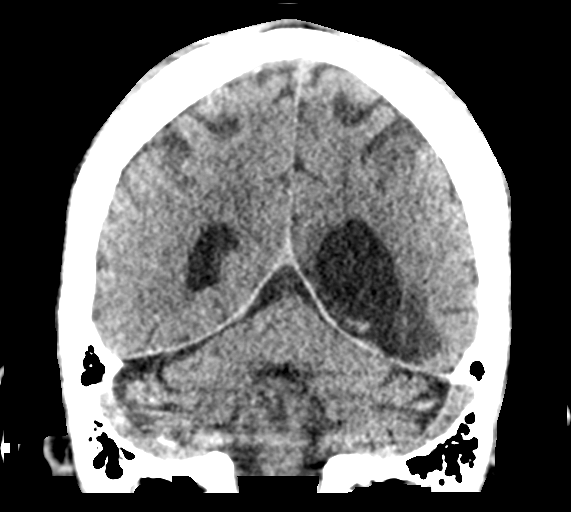
[im 29/66  brain]
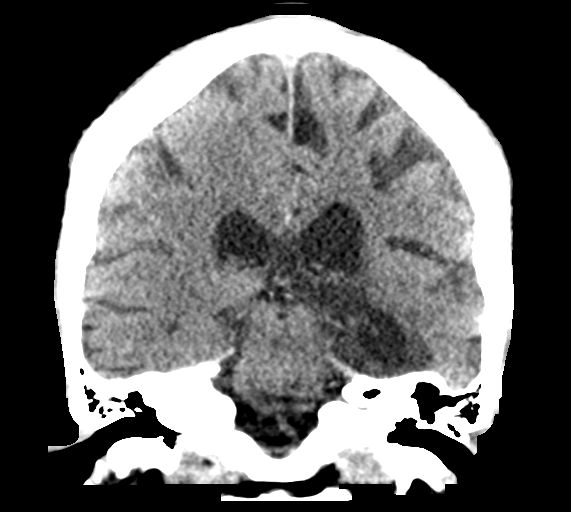
[im 37/66  brain]
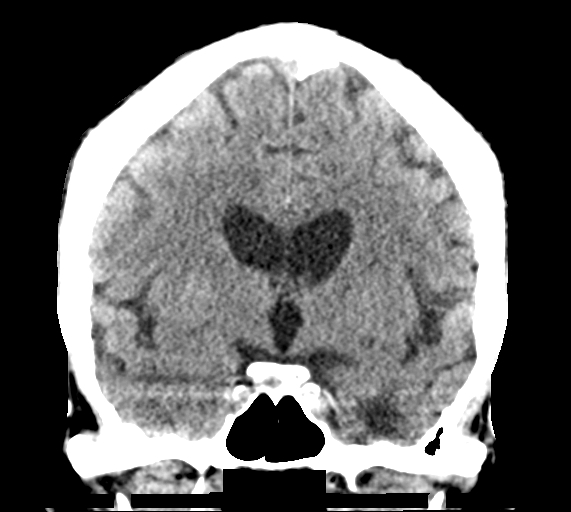

[Series 5: sagittal soft tissue · sagittal · 0.32mm/px · 3 of 52 slices shown]
[im 18/52  brain]
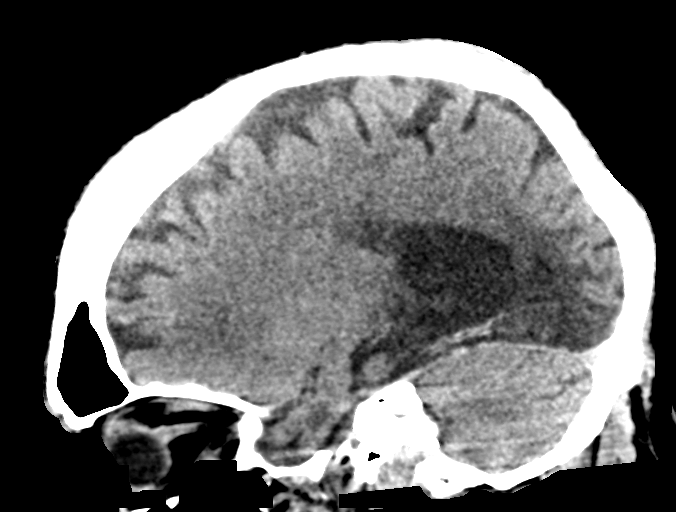
[im 26/52  brain]
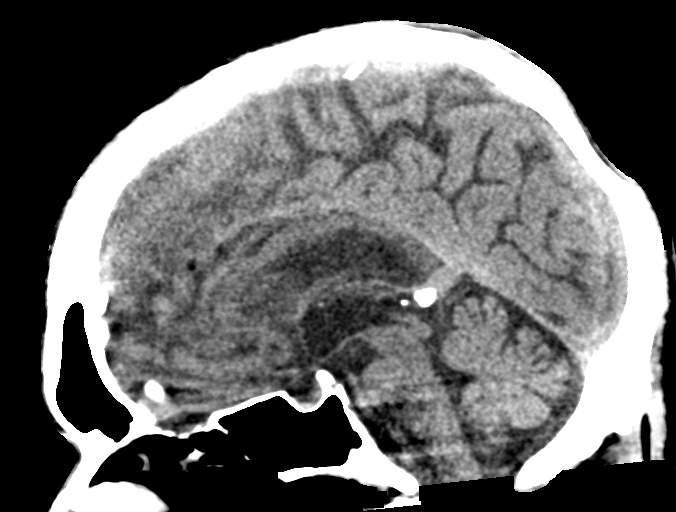
[im 35/52  brain]
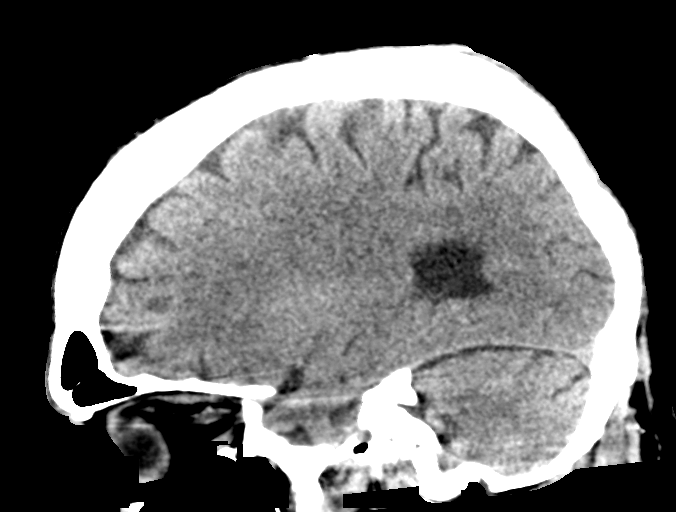

[15 of 47 positions shown; findings below may reference images not displayed]

FINDINGS: Brain: Mild chronic ischemic white matter disease is noted. Minimal
diffuse cortical atrophy is noted. Left occipital encephalomalacia
is noted consistent with old infarction. No mass effect or midline
shift is noted. Ventricular size is within normal limits. There is
no evidence of mass lesion, hemorrhage or acute infarction.

Vascular: No hyperdense vessel or unexpected calcification.

Skull: Normal. Negative for fracture or focal lesion.

Sinuses/Orbits: No acute finding.

Other: None.
IMPRESSION: Mild chronic ischemic white matter disease. Minimal diffuse cortical
atrophy. Old left occipital infarction. No acute intracranial
abnormality seen.
